# Patient Record
Sex: Male | Born: 1945 | Race: White | Hispanic: No | Smoking: Former smoker
Health system: Southern US, Community
[De-identification: ages and names within clinical notes are randomized; demographics above are authoritative.]

## PROBLEM LIST (undated history)

## (undated) DIAGNOSIS — I2510 Atherosclerotic heart disease of native coronary artery without angina pectoris: Secondary | ICD-10-CM

## (undated) DIAGNOSIS — Z9861 Coronary angioplasty status: Secondary | ICD-10-CM

## (undated) DIAGNOSIS — M4630 Infection of intervertebral disc (pyogenic), site unspecified: Secondary | ICD-10-CM

## (undated) DIAGNOSIS — I10 Essential (primary) hypertension: Secondary | ICD-10-CM

## (undated) DIAGNOSIS — K922 Gastrointestinal hemorrhage, unspecified: Secondary | ICD-10-CM

## (undated) DIAGNOSIS — I38 Endocarditis, valve unspecified: Secondary | ICD-10-CM

## (undated) DIAGNOSIS — B955 Unspecified streptococcus as the cause of diseases classified elsewhere: Secondary | ICD-10-CM

## (undated) DIAGNOSIS — I219 Acute myocardial infarction, unspecified: Secondary | ICD-10-CM

## (undated) DIAGNOSIS — G4700 Insomnia, unspecified: Secondary | ICD-10-CM

## (undated) DIAGNOSIS — E785 Hyperlipidemia, unspecified: Secondary | ICD-10-CM

## (undated) DIAGNOSIS — Z87891 Personal history of nicotine dependence: Secondary | ICD-10-CM

## (undated) DIAGNOSIS — C4490 Unspecified malignant neoplasm of skin, unspecified: Secondary | ICD-10-CM

## (undated) DIAGNOSIS — I499 Cardiac arrhythmia, unspecified: Secondary | ICD-10-CM

## (undated) DIAGNOSIS — K5790 Diverticulosis of intestine, part unspecified, without perforation or abscess without bleeding: Secondary | ICD-10-CM

## (undated) DIAGNOSIS — M1990 Unspecified osteoarthritis, unspecified site: Secondary | ICD-10-CM

## (undated) DIAGNOSIS — Z8719 Personal history of other diseases of the digestive system: Secondary | ICD-10-CM

## (undated) DIAGNOSIS — I252 Old myocardial infarction: Secondary | ICD-10-CM

## (undated) DIAGNOSIS — M24011 Loose body in right shoulder: Secondary | ICD-10-CM

## (undated) DIAGNOSIS — Z8711 Personal history of peptic ulcer disease: Secondary | ICD-10-CM

## (undated) DIAGNOSIS — Z961 Presence of intraocular lens: Secondary | ICD-10-CM

## (undated) DIAGNOSIS — H3322 Serous retinal detachment, left eye: Secondary | ICD-10-CM

## (undated) DIAGNOSIS — R7881 Bacteremia: Secondary | ICD-10-CM

## (undated) DIAGNOSIS — H3522 Other non-diabetic proliferative retinopathy, left eye: Secondary | ICD-10-CM

## (undated) DIAGNOSIS — D62 Acute posthemorrhagic anemia: Secondary | ICD-10-CM

## (undated) DIAGNOSIS — I4891 Unspecified atrial fibrillation: Secondary | ICD-10-CM

## (undated) HISTORY — DX: Unspecified atrial fibrillation: I48.91

## (undated) HISTORY — DX: Unspecified streptococcus as the cause of diseases classified elsewhere: R78.81

## (undated) HISTORY — DX: Acute posthemorrhagic anemia: D62

## (undated) HISTORY — PX: OTHER SURGICAL HISTORY: SHX169

## (undated) HISTORY — DX: Gastrointestinal hemorrhage, unspecified: K92.2

## (undated) HISTORY — PX: LUMBAR LAMINECTOMY: SHX95

## (undated) HISTORY — PX: CATARACT EXTRACTION W/ INTRAOCULAR LENS IMPLANT: SHX1309

## (undated) HISTORY — DX: Insomnia, unspecified: G47.00

## (undated) HISTORY — DX: Unspecified streptococcus as the cause of diseases classified elsewhere: B95.5

## (undated) HISTORY — PX: SKIN CANCER EXCISION: SHX779

## (undated) HISTORY — DX: Diverticulosis of intestine, part unspecified, without perforation or abscess without bleeding: K57.90

## (undated) HISTORY — DX: Other non-diabetic proliferative retinopathy, left eye: H35.22

## (undated) HISTORY — DX: Presence of intraocular lens: Z96.1

## (undated) HISTORY — DX: Serous retinal detachment, left eye: H33.22

## (undated) HISTORY — DX: Infection of intervertebral disc (pyogenic), site unspecified: M46.30

## (undated) HISTORY — DX: Personal history of nicotine dependence: Z87.891

## (undated) HISTORY — DX: Endocarditis, valve unspecified: I38

---

## 1990-01-19 DIAGNOSIS — I252 Old myocardial infarction: Secondary | ICD-10-CM

## 1990-01-19 HISTORY — PX: CORONARY ANGIOPLASTY: SHX604

## 1990-01-19 HISTORY — DX: Old myocardial infarction: I25.2

## 2000-12-21 ENCOUNTER — Ambulatory Visit (HOSPITAL_COMMUNITY): Admission: RE | Admit: 2000-12-21 | Discharge: 2000-12-21 | Payer: Self-pay | Admitting: Gastroenterology

## 2000-12-21 ENCOUNTER — Encounter (INDEPENDENT_AMBULATORY_CARE_PROVIDER_SITE_OTHER): Payer: Self-pay | Admitting: *Deleted

## 2002-12-13 ENCOUNTER — Encounter: Admission: RE | Admit: 2002-12-13 | Discharge: 2002-12-13 | Payer: Self-pay | Admitting: Internal Medicine

## 2002-12-13 ENCOUNTER — Inpatient Hospital Stay (HOSPITAL_COMMUNITY): Admission: EM | Admit: 2002-12-13 | Discharge: 2002-12-17 | Payer: Self-pay | Admitting: Emergency Medicine

## 2002-12-15 HISTORY — PX: ERCP W/ SPHICTEROTOMY: SHX1523

## 2002-12-16 ENCOUNTER — Encounter (INDEPENDENT_AMBULATORY_CARE_PROVIDER_SITE_OTHER): Payer: Self-pay | Admitting: *Deleted

## 2002-12-16 HISTORY — PX: LAPAROSCOPIC CHOLECYSTECTOMY: SUR755

## 2008-08-22 HISTORY — PX: CARDIOVASCULAR STRESS TEST: SHX262

## 2008-09-15 ENCOUNTER — Emergency Department (HOSPITAL_COMMUNITY): Admission: EM | Admit: 2008-09-15 | Discharge: 2008-09-15 | Payer: Self-pay | Admitting: Emergency Medicine

## 2010-04-26 LAB — TYPE AND SCREEN
ABO/RH(D): B POS
Antibody Screen: NEGATIVE

## 2010-04-26 LAB — CBC
HCT: 40.9 % (ref 39.0–52.0)
Hemoglobin: 13.8 g/dL (ref 13.0–17.0)
MCHC: 33.7 g/dL (ref 30.0–36.0)
MCV: 97.6 fL (ref 78.0–100.0)
Platelets: 156 10*3/uL (ref 150–400)
RBC: 4.19 MIL/uL — ABNORMAL LOW (ref 4.22–5.81)
RDW: 13.4 % (ref 11.5–15.5)
WBC: 5.9 10*3/uL (ref 4.0–10.5)

## 2010-04-26 LAB — COMPREHENSIVE METABOLIC PANEL
ALT: 22 U/L (ref 0–53)
AST: 27 U/L (ref 0–37)
Albumin: 4.2 g/dL (ref 3.5–5.2)
Alkaline Phosphatase: 44 U/L (ref 39–117)
BUN: 15 mg/dL (ref 6–23)
CO2: 25 mEq/L (ref 19–32)
Calcium: 9.4 mg/dL (ref 8.4–10.5)
Chloride: 104 mEq/L (ref 96–112)
Creatinine, Ser: 0.88 mg/dL (ref 0.4–1.5)
GFR calc Af Amer: >60 mL/min (ref >60)
GFR calc non Af Amer: >60 mL/min (ref >60)
Glucose, Bld: 92 mg/dL (ref 70–99)
Potassium: 4 mEq/L (ref 3.5–5.1)
Sodium: 138 mEq/L (ref 135–145)
Total Bilirubin: 1.5 mg/dL — ABNORMAL HIGH (ref 0.3–1.2)
Total Protein: 6.8 g/dL (ref 6.0–8.3)

## 2010-04-26 LAB — DIFFERENTIAL
Basophils Absolute: 0 10*3/uL (ref 0.0–0.1)
Basophils Relative: 1 % (ref 0–1)
Eosinophils Absolute: 0.2 10*3/uL (ref 0.0–0.7)
Eosinophils Relative: 4 % (ref 0–5)
Lymphocytes Relative: 24 % (ref 12–46)
Lymphs Abs: 1.4 10*3/uL (ref 0.7–4.0)
Monocytes Absolute: 0.5 10*3/uL (ref 0.1–1.0)
Monocytes Relative: 9 % (ref 3–12)
Neutro Abs: 3.7 10*3/uL (ref 1.7–7.7)
Neutrophils Relative %: 62 % (ref 43–77)

## 2010-04-26 LAB — ABO/RH: ABO/RH(D): B POS

## 2010-04-26 LAB — HEMOCCULT GUIAC POC 1CARD (OFFICE): Fecal Occult Bld: POSITIVE

## 2010-04-26 LAB — PROTIME-INR
INR: 1.1 (ref 0.00–1.49)
Prothrombin Time: 14.5 seconds (ref 11.6–15.2)

## 2010-06-06 NOTE — Op Note (Signed)
NAME:  Jonathan Holden, Jonathan Holden                        ACCOUNT NO.:  [id number]   MEDICAL RECORD NO.:  [id number]                   PATIENT TYPE:  INP   LOCATION:  5704                                 FACILITY:  [facility]   PHYSICIAN:  Thornton Park. Daphine Deutscher, M.D.             DATE OF BIRTH:  02/29/1945   DATE OF PROCEDURE:  12/16/2002  DATE OF DISCHARGE:                                 OPERATIVE REPORT   PREOPERATIVE DIAGNOSIS:  Gallstones with choledocholithiasis.   PROCEDURES:  Laparoscopic cholecystectomy with intraoperative cholangiogram.   SURGEON:  Thornton Park. Daphine Deutscher, M.D.   ASSISTANT:  Gabrielle Dare. Janee Morn, M.D.   ANESTHESIA:  General endotracheal.   FINDINGS:  Consistent with acute and chronic cholecystitis with a dilated  common duct that flowed easily into the duodenum.  No retained stones noted.   DESCRIPTION OF PROCEDURE:  Sherrod Toothman was taken to the OR on Saturday,  December 16, 2002, for a laparoscopic cholecystectomy.  Cholecystectomy with  potential complications, limited bile duct injury, and bleeding were  discussed with him preoperatively.  He was given general anesthesia.  A  Foley was inserted.  The potential sites were injected with 0.5% Marcaine.  A longitudinal incision was made in the umbilicus in the inferior aspect.  I  encountered a small umbilical hernia which I then opened and inserted the  Hasson cannula through a pursestring suture without difficulty.  The abdomen  was insufflated and the three trocar sites were placed in the upper abdomen,  again using Marcaine.  The gallbladder was hidden behind the liver adhesions  from the omentum, stuck up on the top of the liver where it had been pretty  well walled off.  I used scissors to take these adhesions down.  We then  grasped the gallbladder and elevated this and found it to have a lot of  adhesions along it, but two were taken away with sharp dissection.  The  infundibulum was somewhat edematous and I carefully used  hydrodissection to  identify the planes and identify formation of the cystic duct.  I put a clip  upon the gallbladder site, incised the beginning of the cystic duct, and  then inserted a Reddick catheter and took it down and the cholangiogram.   The cholangiogram showed some dilatation of the cystic duct inflowing into  the a slightly dilated common bile duct, but with prompt flow into the  duodenum and no obvious retained stones were noted.  The cystic duct was  then triple clipped and divided.  The cystic artery was triple clipped and  divided.  Then the gallbladder was removed from the gallbladder bed, staying  up on the gallbladder without entering it.  He did have a lot of vessel  coming into it, however, we required cautery.  In the end, it was detached,  put in a bag, and brought out through the umbilicus.  I inspected the  gallbladder bed and  used the electrocautery to control bleeding of the bed.  No bleeding or bowel leaks were then noted after it was irrigated.   Once the gallbladder was removed, we used the laparoscope to visualize the  umbilical region where I took down a single adhesion which had been stuck up  into the little hernia.  I then repaired this under laparoscopic vision  first by tying down the pursestring and then putting a single transverse  incision across closing the defect in a longitudinal fashion.  The abdomen  was inspected.  No bleeding or bowel leaks were noted.  The irrigant was  withdrawn with a  suction device and then port sites were removed.  The wounds were closed  with 4-0 Vicryl with Benzoin and Steri-Strips.  The patient seemed to  tolerate the procedure well and was taken to the recovery room in  satisfactory condition.                                               Thornton Park Daphine Deutscher, M.D.    MBM/MEDQ  D:  12/16/2002  T:  12/16/2002  Job:  [id number]   cc:   Bernette Redbird, M.D.  [address] [place]  [place], [place] [address]  Fax:  [phone]   [facility]

## 2010-06-06 NOTE — H&P (Signed)
NAME:  Jonathan Holden, Jonathan Holden                        ACCOUNT NO.:  [id number]   MEDICAL RECORD NO.:  [id number]                   PATIENT TYPE:  EMS   LOCATION:  MINO                                 FACILITY:  [facility]   PHYSICIAN:  Georgann Housekeeper, M.D.                 DATE OF BIRTH:  Sep 01, 1945   DATE OF ADMISSION:  12/13/2002  DATE OF DISCHARGE:                                HISTORY & PHYSICAL   CHIEF COMPLAINT:  Abdominal pain and jaundice.   HISTORY OF PRESENT ILLNESS:  This is a 65 year old male with history of  coronary artery disease, hypertension, hyperlipidemia.  Reportedly about one  month ago or so, he started having some abdominal pain, nonspecific and  described as spasms lasting about two hours and then resolved.  He would get  a little nauseous, sweating at times at night mostly.  Over the last week or  so, this has been getting more frequent, and in the last two days, he had a  lot of abdominal pain, nausea at one time and vomited.  His pain continued.  His nausea resolved after vomiting.  He says the pain usually happens after  he eats, and he gets uncomfortable.  So far he has had no fevers but has  felt hot and cold and sweaty at night at times.  He was yellowish and  jaundiced yesterday.  He came to the clinic today with the jaundice.  On  examination, LFTs were elevated with alkaline phosphatase and total  bilirubin elevated consistent with an obstructive picture.  He had an  ultrasound done at radiology which showed severely dilated common bile duct  with gallstones in the neck of the gallbladder as well as in the gallbladder  and mildly dilated pancreatic duct.  Admitted for further workup.   PAST MEDICAL HISTORY:  1. Coronary artery disease status post angioplasty.  2. MI in 1992.  3. Hyperlipidemia.  4. Gastric ulcer disease.  5. Mild rhinitis.  6. Occasional hypertension.   ALLERGIES:  None.   MEDICATIONS:  1. Lipitor 20 mg a day.  2. Toprol 50 mg daily.  3. Zetia 10 mg daily.  4. Aspirin 1 a day.  5. Multivitamins.  6. Valtrex 500 mg  daily.   PAST SURGICAL HISTORY:  1. Laminectomy L4-L5.  2. Vasectomy.  3. Angioplasty.   SOCIAL HISTORY:  Control and instrumentation engineer, independent, divorced, no children, no  tobacco, occasional wine, occasional caffeine.  Fairly active.   PHYSICAL EXAMINATION:  VITAL SIGNS:  Temperature 98, blood pressure 120/74,  pulse 58, respirations 20.  GENERAL:  Awake and alert, in no acute distress, and mildly jaundiced.  Denies any abdominal pain.  HEENT:  Pupils reactive briskly.  Anicteric.  Oropharynx is clear.  NECK:  Supple, no JVD.  LUNGS:  Clear.  CARDIOVASCULAR:  S1 and S2 without murmur.  ABDOMEN:  Soft without any tenderness.  EXTREMITIES:  No edema.  NEUROLOGIC:  Nonfocal.   LABORATORY DATA:  Lab data from the clinic today showed LFTs with total  bilirubin 6.7, direct bilirubin 4.4, alkaline phosphatase 567, AST 183, ALT  322, total protein 6.3.  White count 13.4, hemoglobin 14.   Ultrasound report showed common bile duct dilated 2 cm, pancreatic duct  mildly dilated.  Stones in the gallbladder neck as well as gallbladder.   ASSESSMENT:  A 65 year old male with obstructive jaundice, elevated liver  functions and elevated white count, gallstones in the gallbladder.   IMPRESSION:  1. Obstructive jaundice.  Differential includes gallstones, acute versus     rule out mass, acute cholecystitis with elevated white count, no fever.     Plan:  I will put him in hospital, clear liquids, Unasyn antibiotics.  GI     for ERCP.  Will get abdominal and pelvic CT to rule out any pancreatic     lesion or ampullary tumor.  2. Coronary artery disease.  Continue on current medications.  3. Hypertension.  Continue on current medications.  4. Hyperlipidemia.  Hold off on Lipitor.   I discussed with Dr. Matthias Hughs who will see the patient  tomorrow.                                                Georgann Housekeeper,  M.D.    KH/MEDQ  D:  12/13/2002  T:  12/13/2002  Job:  [id number]

## 2010-06-06 NOTE — Op Note (Signed)
NAME:  Jonathan Holden, Jonathan Holden                        ACCOUNT NO.:  [id number]   MEDICAL RECORD NO.:  [id number]                   PATIENT TYPE:  INP   LOCATION:  5704                                 FACILITY:  [facility]   PHYSICIAN:  Bernette Redbird, M.D.                DATE OF BIRTH:  1945-09-19   DATE OF PROCEDURE:  12/15/2002  DATE OF DISCHARGE:                                 OPERATIVE REPORT   PROCEDURE:  Endoscopic retrograde cholangiopancreatography with  sphincterotomy and stone extraction.   INDICATIONS:  This is a 65 year old gentleman with a one-month history of  smoldering abdominal pain who presented with jaundice to his primary  physician and had significantly elevated liver chemistries.  Ultrasound  showed gallstones and a markedly dilated common bile duct measuring about 2  cm.  Abdominal CT showed a calcified stone in the distal duct without any  evidence of mass or malignancy.   FINDINGS:  Large stone removed from the CBD via a large sphincterotomy.  No  biliary stricturing or ampullary tumor noted.   DESCRIPTION OF PROCEDURE:  The nature, purpose, and risks of the procedure  had been very carefully discussed with the patient, including risks of  pancreatitis, rare mortality, cardiopulmonary problems, bleeding, infection,  need for emergency surgery, and the possibility of an unsuccessful  procedure.  He provided written consent and was brought from his hospital  room.  He was covered with Unasyn already, so no additional antibiotics were  administered.  The procedure was done with the patient in radiology in the  prone position.  Sedation prior to and during the course of the procedure  was fentanyl 125 mcg and Versed 12.5 mg IV, plus he also received Glucagon 1  mg IV to reduce duodenal contractility in divided doses during the course of  the procedure.  There was no problem with arrhythmias, desaturation, or  clinical instability during the course of the procedure.   The Olympus video duodenoscope was easily passed blindly into the esophagus  and advanced into a normal-appearing stomach and from there into the  duodenum.  There were some patchy erosions in the duodenum, and initially I  thought one of these corresponded to the papilla and there even seemed to be  a small orifice present.  Probing with the sphincterotome, however, did not  really lead to any evidence of an opening and at the suggestion of the  technician, we advanced the scope further down the duodenum where, indeed,  the major papilla was rapidly identified and easily cannulated with the  triple-lumen sphincterotome.  The guidewire was easily advanced deeply into  the biliary system selectively.  Contrast was injected disclosing a large  (roughly 15 mm) stone.  I did not see any biliary strictures, and I should  add that the papilla itself was free of any obvious abnormality such as a  tumor.  The pancreatic duct was never entered on this  exam, nor did we  attempt to do so.   A very generous sphincterotomy was made, probably measuring about 20 mm or  so.  Toward the end of it, there was a little bit oozing at the apex of the  cut, probably a total blood loss of 1 mL or less.   The sphincterotome was exchanged for the 15 mm balloon, which was inflated  and occupied perhaps half the diameter of the duct.  The duct was swept, and  I was able to extract the stone easily on the first attempt.  We then did an  occlusion cholangiogram, which demonstrated no evidence of other filling  defects, and after removing the balloon-tip catheter, there was rapid  drainage of contrast from the bile duct.   The patient tolerated the procedure well, and there were no apparent  complications.   IMPRESSION:  1. Large solitary common bile duct stone removed through a large     sphincterotomy as described above.  2. Minimal oozing with the sphincterotomy.  3. Normal-appearing papilla and no biliary  ductal strictures, thus, no     source of biliary obstruction such as tumor evident, apart from the     presence of the stone, which accounts for the dilatation.   PLAN:  The patient will be observed for complications with laparoscopic  cholecystectomy anticipated in the near future, possibly on this admission.                                               Bernette Redbird, M.D.    RB/MEDQ  D:  12/15/2002  T:  12/15/2002  Job:  [id number]   cc:   Georgann Housekeeper, M.D.  [address]  [place]  [place] [address]  Fax: [phone]   Jimmye Norman III, M.D.  [phone] [address]  [place]  [place] [address]  Fax: [phone]

## 2010-06-06 NOTE — Consult Note (Signed)
NAME:  Jonathan Holden, Jonathan Holden                        ACCOUNT NO.:  [id number]   MEDICAL RECORD NO.:  [id number]                   PATIENT TYPE:  INP   LOCATION:  5705                                 FACILITY:  [facility]   PHYSICIAN:  Bernette Redbird, M.D.                DATE OF BIRTH:  28-Apr-1945   DATE OF CONSULTATION:  12/14/2002  DATE OF DISCHARGE:                                   CONSULTATION   Dr. 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   Jonathan Holden was admitted to the hospital yesterday following a one-month  prodrome of nonspecific, predominantly postprandial, abdominal pain with  nausea, occasional vomiting, but no fevers.  He was noted to be jaundiced in  the office, and an ultrasound was obtained which showed gallbladder stones  and a markedly dilated (2 cm) common bile duct, so he was admitted to the  hospital.  At this time, he is basically asymptomatic apart from some mild  postprandial abdominal tightness.  A CT was obtained today which shows a  calcified gallstone in the distal common bile duct on review with Dr. Charise Killian of radiology.  The ductal system was markedly dilated.  There is no  evident pancreatic mass.   PAST MEDICAL HISTORY:   ALLERGIES:  No known drug allergies.   OUTPATIENT MEDICATIONS:  Lipitor, Toprol, and daily aspirin.   OPERATION:  Remote laminectomy.   MEDICAL ILLNESSES:  1. Coronary disease status post MI approximately 10 years ago, status post     PTCA.  Note that he works out regularly without chest pain.  2. GERD.  3. Hyperlipidemia.  4. Rhinitis.   No known history of pulmonary disease or diabetes.   HABITS:  Occasional alcohol, nonsmoker.   FAMILY HISTORY:  Negative for colon cancer or biliary tract disease in the  immediate family.   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  He lives alone in [place].   REVIEW  OF SYSTEMS:  Apart from the symptoms over the past month presumably  associated with biliary tract disease, the patient is free of ongoing GI  tracts symptomatology such as anorexia, weight loss, dysphagia, reflux,  chronic abdominal pain, constipation, or diarrhea.  He has had cancer  screening with colonoscopy by Dr. Danise Edge two years ago showing an  adenomatous polyp.   PHYSICAL EXAMINATION:  GENERAL:  A lean, healthy-appearing gentleman in no  evident distress, neither anxious nor depressed.  There is no frank jaundice  at this time.  He has no overt pallor.  CHEST:  Clear.  HEART:  Normal.  ABDOMEN:  Without organomegaly, guarding, mass, or tenderness with  particular reference to the right upper quadrant.   LABORATORY DATA:  White count 7300, hemoglobin 13.0 with normal MCV,  platelets 320,000.  INR 1.0.  Chemistry panel pertinent only  for elevated  liver chemistries with a bilirubin of 3.2, alkaline phosphatase 504, SGOT  111, SGPT 206, and albumin 2.6.  These numbers are not markedly changed from  yesterday.  Amylase and lipase are normal.   IMPRESSION:  1. Symptomatic choledocholithiasis with associated cholelithiasis.     Manifestations include elevated liver chemistries and marked biliary     ductal dilatation.  Thus, at least the possibility of an ampullary tumor     needs to be kept in mind.  2. History of coronary disease status post myocardial infarction without     current anginal symptoms.   PLAN:  ERCP with attempted sphincterotomy and stone extraction tomorrow.  The nature, purpose, risks, and alternatives of the procedure were carefully  and thoroughly discussed with the patient with the assistance of a diagram.  His questions were answered.  He is aware that there is perhaps 3 to 5% risk  of postprocedural pancreatitis which can occasionally be fatal in severity,  as well as other complications which occur less commonly such as  cardiopulmonary  reactions, infection, perforation, or bleeding, with the  possibility of need for emergency surgery to correct the problems and with a  moderate possibility of an unsuccessful procedure.  He is agreeable to  proceed.                                               Bernette Redbird, M.D.    RB/MEDQ  D:  12/14/2002  T:  12/14/2002  Job:  [id number]   cc:   Georgann Housekeeper, M.D.  [address]  [place]  [place] [address]  Fax: [phone]   Jimmye Norman III, M.D.  [phone] [address]  [place]  [place] [address]  Fax: [phone]   Danise Edge, M.D.  [address]  [place]  [place] [address]  Fax: [phone]

## 2010-06-06 NOTE — Procedures (Signed)
[place]  Patient:    Jonathan Holden, Jonathan Holden Visit Number: [id number] MRN: [id number]          Service Type: Attending:  Verlin Grills, M.D. Dictated by:   Verlin Grills, M.D. Proc. Date: 12/21/00   CC:         Wayne C. Dorna Bloom, M.D.                           Procedure Report  DATE OF BIRTH:  1945/08/26  REFERRING PHYSICIAN:  [place] C. Dorna Bloom, M.D.  PROCEDURE PERFORMED:  Colonoscopy.  ENDOSCOPIST:  Verlin Grills, M.D.  INDICATIONS FOR PROCEDURE:  The patient is a 65 year old male.  Mr. Grunewald underwent his health maintenance flexible proctosigmoidoscopy.  A small polyp behind a mucosal fold was identified with insertion of the endoscope but not on withdrawal of the endoscope.  I discussed with the patient the complications associated with colonoscopy and polypectomy including a 15 per 1000 risk of bleeding and 4 per 1000 risk of colonic perforation requiring surgical repair.  The patient has signed the operative permit.  PREMEDICATION:  Demerol 50 mg, Versed 5 mg.  ENDOSCOPE:  Olympus pediatric video colonoscope.  DESCRIPTION OF PROCEDURE:  After obtaining informed consent, the patient was placed in the left lateral decubitus position.  I administered intravenous Demerol and intravenous Versed to achieve conscious sedation for the procedure.  The patients blood pressure, oxygen saturation and cardiac rhythm were monitored throughout the procedure and documented in the medical record.  Anal inspection was normal.  Digital rectal exam revealed a nonnodular prostate.  The Olympus pediatric video colonoscope was introduced into the rectum and advanced to the cecum.  Colonic preparation for the exam today was satisfactory.  The patient has extensive universal colonic diverticulosis without signs of diverticulitis.  Rectum:  Normal.  Sigmoid colon and descending colon:  Normal.  Splenic flexure:  Normal.  Transverse  colon:   From the distal transverse colon a 0.5 mm sessile polyp was removed with a cold biopsy forceps.  Hepatic flexure:  Normal.  Ascending colon:  Normal.  Cecum and ileocecal valve:  Normal.  ASSESSMENT: 1. Extensive universal colonic diverticulosis. 2. From the transverse colon a 0.5 mm sessile polyp was removed with a cold    biopsy forceps. RECOMMENDATIONS:  If the colonic polyp returns neoplastic, the patient should undergo a repeat colonoscopy in five years.  If the polyp is nonneoplastic, the patient should undergo a repeat colonoscopy in approximately 10 years. Dictated by:   Verlin Grills, M.D. Attending:  Verlin Grills, M.D. DD:  12/21/00 TD:  12/21/00 Job: [id number]

## 2010-06-06 NOTE — Discharge Summary (Signed)
NAME:  MARIAN, GRANDT                        ACCOUNT NO.:  [id number]   MEDICAL RECORD NO.:  [id number]                   PATIENT TYPE:  INP   LOCATION:  5704                                 FACILITY:  [facility]   PHYSICIAN:  Georgann Housekeeper, M.D.                 DATE OF BIRTH:  Feb 26, 1945   DATE OF ADMISSION:  12/13/2002  DATE OF DISCHARGE:  12/17/2002                                 DISCHARGE SUMMARY   DISCHARGE DIAGNOSES:  1. Choledocholithiasis, abdominal pain, jaundice.  2. Status post laparoscopic cholecystectomy.  3. As far as coronary artery disease, hypertension.   MEDICATIONS AT DISCHARGE:  1. Lipitor 20 mg q.d.  2. Toprol 50 mg q.d.  3. Zetia 10 mg q.d.  4. Aspirin.  5. Multivitamin.   CONDITION:  Stable.   CONSULTATIONS:  1. General surgery with Dr. Daphine Deutscher.  2. GI with Dr. Matthias Hughs.   PROCEDURE:  1. Laparoscopic cholecystectomy.  2. Endoscopic retrograde cholangiopancreatography.   HOSPITAL COURSE:  Please see H&P for details. The patient is 65 years old  with history of coronary artery disease admitted with abdominal pain,  elevated liver enzymes with obstructive picture, and white count of 13,000.  Ultrasound showed gallstone in the gallbladder neck with the mildly dilated  pancreatitis duct and dilated common bile duct.   1. Gastrointestinal. The patient was admitted for obstructive     choledocholithiasis with jaundice and transaminitis. Started on     antibiotics, Unasyn, along with fluids. CT of the abdomen and pelvis was     obtained which did not show any mass. The patient had stone. Underwent a     GI consult with ERCP which successfully extracted the stone by Dr.     Matthias Hughs. Subsequently, his liver enzymes improved considerably. The     patient had a surgical consult and underwent a laparoscopic     cholecystectomy on November 27. The patient tolerated the procedure well.     Please see surgical note for that. His white count improved to completely  normal. Chemistries improved. Hemodynamically remained stable.  2. As far as hypertension and coronary artery disease. Stable.   DISCHARGE INSTRUCTIONS:  The patient was discharged to follow up with Dr.  Daphine Deutscher of surgery in three weeks. Resume his other medications at home.                                                Georgann Housekeeper, M.D.    KH/MEDQ  D:  01/31/2003  T:  01/31/2003  Job:  [id number]

## 2011-02-09 ENCOUNTER — Other Ambulatory Visit: Payer: Self-pay | Admitting: Orthopedic Surgery

## 2011-02-09 DIAGNOSIS — R609 Edema, unspecified: Secondary | ICD-10-CM

## 2011-02-09 DIAGNOSIS — M25511 Pain in right shoulder: Secondary | ICD-10-CM

## 2011-02-13 ENCOUNTER — Ambulatory Visit
Admission: RE | Admit: 2011-02-13 | Discharge: 2011-02-13 | Disposition: A | Discharge status: Home / Self Care | Payer: Medicare Other | Source: Ambulatory Visit | Attending: Orthopedic Surgery | Admitting: Orthopedic Surgery

## 2011-02-13 DIAGNOSIS — R609 Edema, unspecified: Secondary | ICD-10-CM

## 2011-02-13 DIAGNOSIS — M25511 Pain in right shoulder: Secondary | ICD-10-CM

## 2011-03-09 ENCOUNTER — Other Ambulatory Visit: Payer: Self-pay | Admitting: Orthopedic Surgery

## 2011-03-26 ENCOUNTER — Encounter (HOSPITAL_BASED_OUTPATIENT_CLINIC_OR_DEPARTMENT_OTHER): Payer: Self-pay | Admitting: *Deleted

## 2011-03-27 ENCOUNTER — Encounter (HOSPITAL_BASED_OUTPATIENT_CLINIC_OR_DEPARTMENT_OTHER): Payer: Self-pay | Admitting: *Deleted

## 2011-03-27 NOTE — Progress Notes (Signed)
NPO AFTER MN. ARRIVES AT 0700. NEEDS ISTAT. CURRENT EKG, NOTE, STRESS TEST TO BE FAXED FROM DR Verdis Prime.  WILL TAKE METOPROLOL AM OF SURG. W/ SIP OF WATER.

## 2011-03-30 ENCOUNTER — Encounter (HOSPITAL_BASED_OUTPATIENT_CLINIC_OR_DEPARTMENT_OTHER): Payer: Self-pay | Admitting: *Deleted

## 2011-03-30 NOTE — Progress Notes (Signed)
RECEIVED EKG, LAST OFFICE NOTE, AND STRESS TEST , NO ECHO,  FROM DR [facility] OFFICE.  PUT IN CHART.

## 2011-04-01 ENCOUNTER — Ambulatory Visit (HOSPITAL_BASED_OUTPATIENT_CLINIC_OR_DEPARTMENT_OTHER)
Admission: RE | Admit: 2011-04-01 | Discharge: 2011-04-01 | Disposition: A | Discharge status: Home / Self Care | Payer: Medicare Other | Source: Ambulatory Visit | Attending: Orthopedic Surgery | Admitting: Orthopedic Surgery

## 2011-04-01 ENCOUNTER — Ambulatory Visit (HOSPITAL_BASED_OUTPATIENT_CLINIC_OR_DEPARTMENT_OTHER): Payer: Medicare Other | Admitting: Anesthesiology

## 2011-04-01 ENCOUNTER — Encounter (HOSPITAL_BASED_OUTPATIENT_CLINIC_OR_DEPARTMENT_OTHER): Payer: Self-pay | Admitting: *Deleted

## 2011-04-01 ENCOUNTER — Encounter (HOSPITAL_BASED_OUTPATIENT_CLINIC_OR_DEPARTMENT_OTHER)
Admission: RE | Disposition: A | Discharge status: Home / Self Care | Payer: Self-pay | Source: Ambulatory Visit | Attending: Orthopedic Surgery

## 2011-04-01 ENCOUNTER — Encounter (HOSPITAL_BASED_OUTPATIENT_CLINIC_OR_DEPARTMENT_OTHER): Payer: Self-pay | Admitting: Anesthesiology

## 2011-04-01 DIAGNOSIS — I10 Essential (primary) hypertension: Secondary | ICD-10-CM | POA: Insufficient documentation

## 2011-04-01 DIAGNOSIS — E785 Hyperlipidemia, unspecified: Secondary | ICD-10-CM | POA: Insufficient documentation

## 2011-04-01 DIAGNOSIS — I252 Old myocardial infarction: Secondary | ICD-10-CM | POA: Insufficient documentation

## 2011-04-01 DIAGNOSIS — Z9861 Coronary angioplasty status: Secondary | ICD-10-CM | POA: Insufficient documentation

## 2011-04-01 DIAGNOSIS — M19019 Primary osteoarthritis, unspecified shoulder: Secondary | ICD-10-CM | POA: Insufficient documentation

## 2011-04-01 DIAGNOSIS — Z79899 Other long term (current) drug therapy: Secondary | ICD-10-CM | POA: Insufficient documentation

## 2011-04-01 DIAGNOSIS — M67919 Unspecified disorder of synovium and tendon, unspecified shoulder: Secondary | ICD-10-CM | POA: Insufficient documentation

## 2011-04-01 DIAGNOSIS — M24019 Loose body in unspecified shoulder: Secondary | ICD-10-CM | POA: Insufficient documentation

## 2011-04-01 DIAGNOSIS — I2510 Atherosclerotic heart disease of native coronary artery without angina pectoris: Secondary | ICD-10-CM | POA: Insufficient documentation

## 2011-04-01 DIAGNOSIS — M719 Bursopathy, unspecified: Secondary | ICD-10-CM | POA: Insufficient documentation

## 2011-04-01 HISTORY — DX: Atherosclerotic heart disease of native coronary artery without angina pectoris: I25.10

## 2011-04-01 HISTORY — DX: Coronary angioplasty status: Z98.61

## 2011-04-01 HISTORY — DX: Essential (primary) hypertension: I10

## 2011-04-01 HISTORY — DX: Unspecified osteoarthritis, unspecified site: M19.90

## 2011-04-01 HISTORY — DX: Personal history of other diseases of the digestive system: Z87.19

## 2011-04-01 HISTORY — DX: Hyperlipidemia, unspecified: E78.5

## 2011-04-01 HISTORY — DX: Old myocardial infarction: I25.2

## 2011-04-01 HISTORY — DX: Loose body in right shoulder: M24.011

## 2011-04-01 HISTORY — DX: Personal history of peptic ulcer disease: Z87.11

## 2011-04-01 LAB — POCT I-STAT, CHEM 8
BUN: 12 mg/dL (ref 6–23)
Calcium, Ion: 1.28 mmol/L (ref 1.12–1.32)
Chloride: 106 mEq/L (ref 96–112)
Glucose, Bld: 106 mg/dL — ABNORMAL HIGH (ref 70–99)
Potassium: 4.4 mEq/L (ref 3.5–5.1)

## 2011-04-01 SURGERY — SHOULDER ARTHROSCOPY WITH SUBACROMIAL DECOMPRESSION
Anesthesia: General | Site: Shoulder | Laterality: Right | Wound class: Clean

## 2011-04-01 MED ORDER — BUPIVACAINE-EPINEPHRINE 0.5% -1:200000 IJ SOLN
INTRAMUSCULAR | Status: DC | PRN
  Administered 2011-04-01: 12 mL

## 2011-04-01 MED ORDER — SODIUM CHLORIDE 0.9 % IR SOLN
Status: DC | PRN
  Administered 2011-04-01: 09:00:00

## 2011-04-01 MED ORDER — LACTATED RINGERS IV SOLN
INTRAVENOUS | Status: DC
  Administered 2011-04-01 (×3): via INTRAVENOUS

## 2011-04-01 MED ORDER — FENTANYL CITRATE 0.05 MG/ML IJ SOLN
INTRAMUSCULAR | Status: DC | PRN
  Administered 2011-04-01: 50 ug via INTRAVENOUS
  Administered 2011-04-01 (×2): 25 ug via INTRAVENOUS

## 2011-04-01 MED ORDER — METHOCARBAMOL 500 MG PO TABS: 500.0000 mg | ORAL_TABLET | Freq: Four times a day (QID) | ORAL | Status: AC | PRN

## 2011-04-01 MED ORDER — FENTANYL CITRATE 0.05 MG/ML IJ SOLN
100.0000 ug | Freq: Once | INTRAMUSCULAR | Status: AC
  Administered 2011-04-01: 100 ug via INTRAVENOUS

## 2011-04-01 MED ORDER — HYDROMORPHONE HCL PF 1 MG/ML IJ SOLN: 0.2500 mg | INTRAMUSCULAR | Status: DC | PRN

## 2011-04-01 MED ORDER — OXYCODONE-ACETAMINOPHEN 5-325 MG PO TABS: 1.0000 | ORAL_TABLET | ORAL | Status: AC | PRN

## 2011-04-01 MED ORDER — SODIUM CHLORIDE 0.9 % IR SOLN
Status: DC | PRN
  Administered 2011-04-01: 15000 mL

## 2011-04-01 MED ORDER — MIDAZOLAM HCL 2 MG/2ML IJ SOLN
2.0000 mg | Freq: Once | INTRAMUSCULAR | Status: AC
  Administered 2011-04-01: 2 mg via INTRAVENOUS

## 2011-04-01 MED ORDER — DEXAMETHASONE SODIUM PHOSPHATE 4 MG/ML IJ SOLN
INTRAMUSCULAR | Status: DC | PRN
  Administered 2011-04-01: 10 mg via INTRAVENOUS

## 2011-04-01 MED ORDER — KETOROLAC TROMETHAMINE 30 MG/ML IJ SOLN
15.0000 mg | Freq: Once | INTRAMUSCULAR | Status: AC | PRN
  Administered 2011-04-01: 30 mg via INTRAVENOUS

## 2011-04-01 MED ORDER — LIDOCAINE HCL (CARDIAC) 20 MG/ML IV SOLN
INTRAVENOUS | Status: DC | PRN
  Administered 2011-04-01: 80 mg via INTRAVENOUS

## 2011-04-01 MED ORDER — ONDANSETRON HCL 4 MG/2ML IJ SOLN
INTRAMUSCULAR | Status: DC | PRN
  Administered 2011-04-01: 4 mg via INTRAVENOUS

## 2011-04-01 MED ORDER — PROPOFOL 10 MG/ML IV EMUL
INTRAVENOUS | Status: DC | PRN
  Administered 2011-04-01: 220 mg via INTRAVENOUS

## 2011-04-01 MED ORDER — POVIDONE-IODINE 7.5 % EX SOLN: Freq: Once | CUTANEOUS | Status: DC

## 2011-04-01 MED ORDER — PROMETHAZINE HCL 25 MG/ML IJ SOLN: 6.2500 mg | INTRAMUSCULAR | Status: DC | PRN

## 2011-04-01 MED ORDER — ROPIVACAINE HCL 5 MG/ML IJ SOLN
INTRAMUSCULAR | Status: DC | PRN
  Administered 2011-04-01: 25 mL

## 2011-04-01 MED ORDER — EPHEDRINE SULFATE 50 MG/ML IJ SOLN
INTRAMUSCULAR | Status: DC | PRN
  Administered 2011-04-01 (×2): 10 mg via INTRAVENOUS
  Administered 2011-04-01: 15 mg via INTRAVENOUS
  Administered 2011-04-01: 10 mg via INTRAVENOUS

## 2011-04-01 MED ORDER — SUCCINYLCHOLINE CHLORIDE 20 MG/ML IJ SOLN
INTRAMUSCULAR | Status: DC | PRN
  Administered 2011-04-01: 120 mg via INTRAVENOUS

## 2011-04-01 SURGICAL SUPPLY — 75 items
BENZOIN TINCTURE PRP APPL 2/3 (GAUZE/BANDAGES/DRESSINGS) ×2 IMPLANT
BLADE 4.2CUDA (BLADE) ×2 IMPLANT
BLADE CUDA 4.2 (BLADE) ×2 IMPLANT
BLADE CUDA 5.5 (BLADE) ×2 IMPLANT
BLADE CUDA SHAVER 3.5 (BLADE) IMPLANT
BLADE CUTTER GATOR 3.5 (BLADE) IMPLANT
BLADE FLAT COURSE (BLADE) IMPLANT
BLADE GREAT WHITE 4.2 (BLADE) IMPLANT
BLADE SURG 10 STRL SS (BLADE) IMPLANT
BLADE SURG 15 STRL LF DISP TIS (BLADE) IMPLANT
BLADE SURG 15 STRL SS (BLADE)
BUR OVAL 4.0 (BURR) ×2 IMPLANT
CANISTER SUCT LVC 12 LTR MEDI- (MISCELLANEOUS) ×4 IMPLANT
CANISTER SUCTION 1200CC (MISCELLANEOUS) ×2 IMPLANT
CANISTER SUCTION 2500CC (MISCELLANEOUS) IMPLANT
CANNULA 5.75X7 CRYSTAL CLEAR (CANNULA) ×2 IMPLANT
CANNULA TWIST IN 8.25X7CM (CANNULA) ×2 IMPLANT
CLOTH BEACON ORANGE TIMEOUT ST (SAFETY) ×2 IMPLANT
CONMED GREAT WHITE 5.5MM ×2 IMPLANT
DRAPE LG THREE QUARTER DISP (DRAPES) ×4 IMPLANT
DRAPE SHOULDER BEACH CHAIR (DRAPES) ×2 IMPLANT
DRAPE U-SHAPE 47X51 STRL (DRAPES) ×2 IMPLANT
DRSG ADAPTIC 3X8 NADH LF (GAUZE/BANDAGES/DRESSINGS) ×2 IMPLANT
DRSG EMULSION OIL 3X3 NADH (GAUZE/BANDAGES/DRESSINGS) ×2 IMPLANT
DRSG PAD ABDOMINAL 8X10 ST (GAUZE/BANDAGES/DRESSINGS) ×2 IMPLANT
DURAPREP 26ML APPLICATOR (WOUND CARE) ×2 IMPLANT
ELECT MENISCUS 165MM 90D (ELECTRODE) IMPLANT
ELECT REM PT RETURN 9FT ADLT (ELECTROSURGICAL) ×2
ELECTRODE REM PT RTRN 9FT ADLT (ELECTROSURGICAL) ×1 IMPLANT
GLOVE BIOGEL PI IND STRL 6.5 (GLOVE) ×2 IMPLANT
GLOVE BIOGEL PI IND STRL 8 (GLOVE) ×1 IMPLANT
GLOVE BIOGEL PI INDICATOR 6.5 (GLOVE) ×2
GLOVE BIOGEL PI INDICATOR 8 (GLOVE) ×1
GLOVE ECLIPSE 8.0 STRL XLNG CF (GLOVE) ×8 IMPLANT
GLOVE INDICATOR 8.0 STRL GRN (GLOVE) ×4 IMPLANT
GOWN PREVENTION PLUS LG XLONG (DISPOSABLE) ×2 IMPLANT
GOWN STRL REIN XL XLG (GOWN DISPOSABLE) ×4 IMPLANT
IV NS IRRIG 3000ML ARTHROMATIC (IV SOLUTION) ×14 IMPLANT
NDL SAFETY ECLIPSE 18X1.5 (NEEDLE) ×1 IMPLANT
NEEDLE 1/2 CIR CATGUT .05X1.09 (NEEDLE) IMPLANT
NEEDLE HYPO 18GX1.5 BLUNT FILL (NEEDLE) ×2 IMPLANT
NEEDLE HYPO 18GX1.5 SHARP (NEEDLE) ×1
NEEDLE HYPO 22GX1.5 SAFETY (NEEDLE) ×2 IMPLANT
NS IRRIG 500ML POUR BTL (IV SOLUTION) IMPLANT
PACK ARTHROSCOPY DSU (CUSTOM PROCEDURE TRAY) ×2 IMPLANT
PACK BASIN DAY SURGERY FS (CUSTOM PROCEDURE TRAY) ×2 IMPLANT
PENCIL BUTTON HOLSTER BLD 10FT (ELECTRODE) IMPLANT
SET ARTHROSCOPY TUBING (MISCELLANEOUS) ×1
SET ARTHROSCOPY TUBING LN (MISCELLANEOUS) ×1 IMPLANT
SLEEVE SURGEON STRL (DRAPES) ×6 IMPLANT
SLING ARM IMMOBILIZER LRG (SOFTGOODS) ×2 IMPLANT
SPONGE GAUZE 4X4 12PLY (GAUZE/BANDAGES/DRESSINGS) ×2 IMPLANT
SPONGE SURGIFOAM ABS GEL 100 (HEMOSTASIS) IMPLANT
STAPLER VISISTAT 35W (STAPLE) IMPLANT
STRIP CLOSURE SKIN 1/2X4 (GAUZE/BANDAGES/DRESSINGS) ×2 IMPLANT
SUCTION FRAZIER TIP 10 FR DISP (SUCTIONS) ×2 IMPLANT
SUT BONE WAX W31G (SUTURE) IMPLANT
SUT ETHIBOND GREEN BRAID 0S 4 (SUTURE) IMPLANT
SUT ETHIBOND NAB CT1 #1 30IN (SUTURE) IMPLANT
SUT ETHILON 4 0 PS 2 18 (SUTURE) ×4 IMPLANT
SUT VIC AB 0 CT1 36 (SUTURE) IMPLANT
SUT VIC AB 1 CT1 36 (SUTURE) IMPLANT
SUT VIC AB 2-0 CT1 27 (SUTURE)
SUT VIC AB 2-0 CT1 TAPERPNT 27 (SUTURE) IMPLANT
SUT VIC AB 3-0 SH 27 (SUTURE)
SUT VIC AB 3-0 SH 27X BRD (SUTURE) IMPLANT
SUT VICRYL 4-0 PS2 18IN ABS (SUTURE) IMPLANT
SYR BULB IRRIGATION 50ML (SYRINGE) ×2 IMPLANT
SYRINGE 10CC LL (SYRINGE) ×2 IMPLANT
TAPE CLOTH SURG 6X10 WHT LF (GAUZE/BANDAGES/DRESSINGS) ×2 IMPLANT
TAPE HYPAFIX 6X30 (GAUZE/BANDAGES/DRESSINGS) IMPLANT
TOWEL OR 17X24 6PK STRL BLUE (TOWEL DISPOSABLE) ×4 IMPLANT
TUBE CONNECTING 12X1/4 (SUCTIONS) ×2 IMPLANT
WAND 90 DEG TURBOVAC W/CORD (SURGICAL WAND) ×2 IMPLANT
WATER STERILE IRR 500ML POUR (IV SOLUTION) ×2 IMPLANT

## 2011-04-01 NOTE — Anesthesia Postprocedure Evaluation (Signed)
  Anesthesia Post-op Note  Patient: Jonathan Holden  Procedure(s) Performed: Procedure(s) (LRB): SHOULDER ARTHROSCOPY WITH SUBACROMIAL DECOMPRESSION (Right)  Patient Location: PACU  Anesthesia Type: GA combined with regional for post-op pain  Level of Consciousness: awake and alert   Airway and Oxygen Therapy: Patient Spontanous Breathing  Post-op Pain: mild  Post-op Assessment: Post-op Vital signs reviewed, Patient's Cardiovascular Status Stable, Respiratory Function Stable, Patent Airway and No signs of Nausea or vomiting  Post-op Vital Signs: stable  Complications: No apparent anesthesia complications

## 2011-04-01 NOTE — Anesthesia Procedure Notes (Addendum)
Procedure Name: Intubation Date/Time: 04/01/2011 8:42 AM Performed by: Fran Lowes Pre-anesthesia Checklist: Patient identified, Emergency Drugs available, Suction available and Patient being monitored Patient Re-evaluated:Patient Re-evaluated prior to inductionOxygen Delivery Method: Circle System Utilized Preoxygenation: Pre-oxygenation with 100% oxygen Intubation Type: IV induction Ventilation: Mask ventilation without difficulty Laryngoscope Size: Mac and 4 Grade View: Grade I Tube type: Oral Number of attempts: 1 Airway Equipment and Method: stylet,  oral airway and LTA kit utilized Placement Confirmation: ETT inserted through vocal cords under direct vision,  positive ETCO2 and breath sounds checked- equal and bilateral Secured at: 23 cm Tube secured with: Tape Dental Injury: Teeth and Oropharynx as per pre-operative assessment    Anesthesia Regional Block:  Interscalene brachial plexus block  Pre-Anesthetic Checklist: ,, timeout performed, Correct Patient, Correct Site, Correct Laterality, Correct Procedure, Correct Position, site marked, Risks and benefits discussed,  Surgical consent,  Pre-op evaluation,  At surgeon's request and post-op pain management  Laterality: Right  Prep: chloraprep       Needles:  Injection technique: Single-shot  Needle Type: Echogenic Needle     Needle Length:cm 9 cm Needle Gauge: 21 G    Additional Needles:  Procedures: ultrasound guided Interscalene brachial plexus block Narrative:  Start time: 04/01/2011 8:14 AM End time: 04/01/2011 8:23 AM Injection made incrementally with aspirations every 5 mL.  Performed by: Personally  Anesthesiologist: Eilene Ghazi MD  Additional Notes: Patient tolerated the procedure well without complications  Interscalene brachial plexus block

## 2011-04-01 NOTE — Brief Op Note (Signed)
04/01/2011  11:04 AM  PATIENT:  Jonathan Holden  66 y.o. male  PRE-OPERATIVE DIAGNOSIS:  right shoulder multiple loose bodies  POST-OPERATIVE DIAGNOSIS:  right shoulder multiple loose bodies, labral tear,rotator cuff tendinopathy  PROCEDURE:  Procedure(s) (LRB): SHOULDER ARTHROSCOPY WITH SUBACROMIAL DECOMPRESSION (Right),labral debridement, and removal numerous loose osteochondral bodies  SURGEON:  Surgeon(s) and Role:    * Drucilla Schmidt, MD - Primary  PHYSICIAN ASSISTANT:   ASSISTANTS: Mr Idolina Primer [facility],   ANESTHESIA:   regional and general  EBL:  Total I/O In: 1400 [I.V.:1400] Out: -   BLOOD ADMINISTERED:none  DRAINS: none   LOCAL MEDICATIONS USED:  NONE  SPECIMEN:  Source of Specimen:  multiple osteochondral ragments  DISPOSITION OF SPECIMEN:  N/A  COUNTS:  YES  TOURNIQUET:  * No tourniquets in log *  DICTATION: .Other Dictation: Dictation Number [id number]  PLAN OF CARE: Discharge to home after PACU  PATIENT DISPOSITION:  PACU - hemodynamically stable.   Delay start of Pharmacological VTE agent (>24hrs) due to surgical blood loss or risk of bleeding: not applicable

## 2011-04-01 NOTE — Transfer of Care (Signed)
Immediate Anesthesia Transfer of Care Note  Patient: Jonathan Holden  Procedure(s) Performed: Procedure(s) (LRB): SHOULDER ARTHROSCOPY WITH SUBACROMIAL DECOMPRESSION (Right)  Patient Location: Patient transported to PACU with oxygen via face mask at 4 Liters / Min  Anesthesia Type: General  Level of Consciousness: awake and alert   Airway & Oxygen Therapy: Patient Spontanous Breathing and Patient connected to face mask oxygen  Post-op Assessment: Report given to PACU RN and Post -op Vital signs reviewed and stable  Post vital signs: Reviewed and stable  Dentition: Teeth and oropharynx remain in pre-op condition  Complications: No apparent anesthesia complications

## 2011-04-01 NOTE — Anesthesia Preprocedure Evaluation (Addendum)
Anesthesia Evaluation  Patient identified by MRN, date of birth, ID band Patient awake    Reviewed: Allergy & Precautions, H&P , NPO status , Patient's Chart, lab work & pertinent test results  Airway Mallampati: II TM Distance: <3 FB Neck ROM: Full    Dental No notable dental hx.    Pulmonary neg pulmonary ROS,  breath sounds clear to auscultation  Pulmonary exam normal       Cardiovascular Exercise Tolerance: Good hypertension, + CAD Rhythm:Regular Rate:Normal     Neuro/Psych negative neurological ROS  negative psych ROS   GI/Hepatic Neg liver ROS, GERD-  ,  Endo/Other  negative endocrine ROS  Renal/GU negative Renal ROS  negative genitourinary   Musculoskeletal negative musculoskeletal ROS (+)   Abdominal   Peds negative pediatric ROS (+)  Hematology negative hematology ROS (+)   Anesthesia Other Findings   Reproductive/Obstetrics negative OB ROS                          Anesthesia Physical Anesthesia Plan  ASA: III  Anesthesia Plan: General   Post-op Pain Management:    Induction: Intravenous  Airway Management Planned: Oral ETT  Additional Equipment:   Intra-op Plan:   Post-operative Plan: Extubation in OR  Informed Consent: I have reviewed the patients History and Physical, chart, labs and discussed the procedure including the risks, benefits and alternatives for the proposed anesthesia with the patient or authorized representative who has indicated his/her understanding and acceptance.   Dental advisory given  Plan Discussed with: CRNA  Anesthesia Plan Comments:         Anesthesia Quick Evaluation

## 2011-04-01 NOTE — Discharge Instructions (Signed)
Wear sling for comfort, reinforce dressing as needed,use ice to shoulder as needed. Call if problems.

## 2011-04-01 NOTE — H&P (Signed)
Jonathan Holden is an 66 y.o. male.   Chief Complaint: *pain in Rt. Shoulder with ROM. HPI: Progressive pain in Rt. Shoulder with day to day activities.  Past Medical History  Diagnosis Date  . Hypertension   . Hyperlipidemia   . History of MI (myocardial infarction) 1992  . H/O gastric ulcer   . S/P coronary angioplasty   . Loose body of right shoulder   . Arthritis HANDS AND RIGHT SHOULDER  . Coronary artery disease CARDIOLOGIST- DR Verdis Prime- LAST VISIT 6 MON AGO--  REQUESTED NOTE, EKG, ECHO, STRESS TEST    PT DENIES S & S    Past Surgical History  Procedure Date  . Laparoscopic cholecystectomy 12-16-2002  . Ercp w/ sphicterotomy 12-15-2002    AND STONE EXTRACTION  . Coronary angioplasty 1992  . Lumbar laminectomy 20 YRS AGO    L5  . Cardiovascular stress test 08-22-2008---  DR Verdis Prime    NO ISCHEMIA.     History reviewed. No pertinent family history. Social History:  reports that he quit smoking about 21 years ago. His smoking use included Cigarettes. He has a 40 pack-year smoking history. He has never used smokeless tobacco. He reports that he drinks about 4.2 ounces of alcohol per week. He reports that he does not use illicit drugs.  Allergies: No Known Allergies  Medications Prior to Admission  Medication Dose Route Frequency Provider Last Rate Last Dose  . lactated ringers infusion   Intravenous Continuous Gaetano Hawthorne, MD      . povidone-iodine (BETADINE) 7.5 % scrub   Topical Once Drucilla Schmidt, MD       Medications Prior to Admission  Medication Sig Dispense Refill  . Atorvastatin Calcium (LIPITOR PO) Take 1 tablet by mouth every evening.      . Coenzyme Q10 (COQ-10 PO) Take by mouth daily.      Marland Kitchen METOPROLOL TARTRATE PO Take 1 tablet by mouth 2 (two) times daily.      . Multiple Vitamin (MULTIVITAMIN) tablet Take 1 tablet by mouth daily.        No results found for this or any previous visit (from the past 48 hour(s)). No results  found.  Review of Systems  Constitutional: Negative.   HENT: Negative.   Eyes: Negative.   Respiratory: Negative.   Cardiovascular: Negative.   Gastrointestinal: Negative.   Genitourinary: Negative.   Musculoskeletal: Positive for joint pain.  Skin: Negative.   Neurological: Negative.   Endo/Heme/Allergies: Negative.   Psychiatric/Behavioral: Negative.     Blood pressure 138/89, pulse 69, temperature 97.6 F (36.4 C), temperature source Oral, resp. rate 20, height 6\' 3"  (1.905 m), weight 88.905 kg (196 lb), SpO2 98.00%. Physical Exam  Constitutional: He is oriented to person, place, and time. He appears well-developed and well-nourished.  HENT:  Head: Normocephalic and atraumatic.  Eyes: Conjunctivae are normal. Pupils are equal, round, and reactive to light.  Neck: Normal range of motion. Neck supple.  Cardiovascular: Normal rate and regular rhythm.   Respiratory: Effort normal and breath sounds normal.  GI: Soft. Bowel sounds are normal.  Musculoskeletal: He exhibits tenderness.  Neurological: He is alert and oriented to person, place, and time.  Skin: Skin is warm and dry.  Psychiatric: He has a normal mood and affect.     Assessment/Plan Impingement & loose bodies Rt. Shoulder.  Plan Scope with removal of loose bodies and SAD.  Skyelynn Rambeau III,Floyde Dingley L 04/01/2011, 8:09 AM

## 2011-04-02 NOTE — Op Note (Signed)
NAMEDARROLL, BREDESON              ACCOUNT NO.:  [id number]  MEDICAL RECORD NO.:  [id number]  LOCATION:                               FACILITY:  [facility]  PHYSICIAN:  Marlowe Kays, M.D.  DATE OF BIRTH:  [birth year]  DATE OF PROCEDURE:  04/01/2011 DATE OF DISCHARGE:                              OPERATIVE REPORT   PREOPERATIVE DIAGNOSES: 1. 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 2. Rotator cuff tendinopathy.  POSTOPERATIVE DIAGNOSES: 1. 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 2. Rotator cuff tendinopathy.  OPERATION: 1. Arthroscopic removal of numerous osteochondral bodies from     glenohumeral joint. 2. Arthroscopic subacromial decompression.  SURGEON:  Marlowe Kays, M.D.  ASSISTANTDruscilla Brownie. Cherlynn June.  ANESTHESIA:  General preceded by interscalene block.  PATHOLOGY AND JUSTIFICATION FOR PROCEDURE:  Painful mechanically dysfunctional right shoulder.  He does have osteoarthritic changes on plain x-ray, but also had as a minimum 2 large osteochondral loose bodies.  A CT scan was done indicating that he had numerous osteochondral loose bodies and synovial attached bodies in the shoulder joint, which mechanically was not functioning ideally.  He was advised that debridement of the joint would probably improve the shoulder function, but would not take care of all of his problems because of the arthritic changes.  DESCRIPTION OF PROCEDURE:  Interscalene block by anesthesiologist, satisfied general anesthesia, beach-chair position on the sliding frame, right shoulder girdle was prepped with DuraPrep, draped in sterile field.  Anatomy of the shoulder joint was marked out.  Time-out performed.  I infiltrated posterior lateral and anterior portals with Marcaine with adrenaline for hemostatic purposes and also  the subacromial space.  Through posterior soft spot portal, atraumatically entered the glenohumeral joint.  There was marked synovitis.  There was erosion of the humeral head.  He had too-numerous-to-count loose fragments in the joint, which I used a switching stick to make an anterior incision between the biceps tendon and subscapularis and over this, I placed initially a large metal cannula that subsequently went to a larger bore plastic cannula, so that we could utilize 5.5 great White shaver and pituitary rongeur and also baskets.  I meticulously removed all the fragments that were visible, some of which were lying free, but many of them had synovial attachments using combination of these instruments.  When we had removed all the fragments that we could find and as the larger ones were saved for the patient's family, I then redirected the scope to the subacromial space and through a lateral portal, introduced a 4.2 shaver.  He did not have much in the way of bursal tissue.  I used the 90-degree ArthroCare vaporizer to remove soft tissue from the undersurface of the acromion and then followed this with a 4-mm oval bur and went back and forth between the bur and the vaporizer until we had wide decompression.  There was no evidence for any rotator cuff tear externally.  All fluid possible was then removed. There was no unusual bleeding.  The 3 portals were closed with multiple 4-0 nylon  sutures.  Betadine, Adaptic, dry sterile dressing, shoulder immobilizer were applied.  He tolerated the procedure well and was taken to recovery room in satisfactory condition with no known complications.          ______________________________ Marlowe Kays, M.D.     JA/MEDQ  D:  04/01/2011  T:  04/02/2011  Job:  [id number]

## 2012-07-06 ENCOUNTER — Other Ambulatory Visit: Payer: Self-pay | Admitting: Dermatology

## 2012-10-12 ENCOUNTER — Other Ambulatory Visit: Payer: Self-pay | Admitting: Dermatology

## 2012-11-07 ENCOUNTER — Other Ambulatory Visit: Payer: Self-pay | Admitting: *Deleted

## 2012-11-07 DIAGNOSIS — Z79899 Other long term (current) drug therapy: Secondary | ICD-10-CM

## 2012-11-07 DIAGNOSIS — E785 Hyperlipidemia, unspecified: Secondary | ICD-10-CM

## 2012-11-15 ENCOUNTER — Other Ambulatory Visit: Payer: Self-pay | Admitting: Dermatology

## 2012-11-29 ENCOUNTER — Other Ambulatory Visit: Payer: Self-pay | Admitting: Dermatology

## 2012-12-06 ENCOUNTER — Other Ambulatory Visit (INDEPENDENT_AMBULATORY_CARE_PROVIDER_SITE_OTHER): Payer: Medicare Other

## 2012-12-06 DIAGNOSIS — Z79899 Other long term (current) drug therapy: Secondary | ICD-10-CM

## 2012-12-06 DIAGNOSIS — E785 Hyperlipidemia, unspecified: Secondary | ICD-10-CM

## 2012-12-06 LAB — LIPID PANEL
Total CHOL/HDL Ratio: 4
VLDL: 20 mg/dL (ref 0.0–40.0)

## 2012-12-06 LAB — LDL CHOLESTEROL, DIRECT: Direct LDL: 143.5 mg/dL

## 2012-12-06 LAB — ALT: ALT: 27 U/L (ref 0–53)

## 2012-12-12 ENCOUNTER — Telehealth: Payer: Self-pay

## 2012-12-12 NOTE — Telephone Encounter (Signed)
Message copied by Jarvis Newcomer on Mon Dec 12, 2012 11:16 AM ------      Message from: Verdis Prime      Created: Fri Dec 09, 2012 10:07 AM       The LDL is 143. Continue current regimen, continue exercise, tighten diet as much as possible avoiding animal fats ------

## 2012-12-12 NOTE — Telephone Encounter (Signed)
pt given lab results.The LDL is 143. Continue current regimen, continue exercise, tighten diet as much as possible avoiding animal fats.pt verbalized understanding.

## 2013-03-23 ENCOUNTER — Telehealth: Payer: Self-pay | Admitting: Interventional Cardiology

## 2013-03-23 NOTE — Telephone Encounter (Signed)
per pt rqst [facility] listed as pt pharmacy of choice.

## 2013-03-23 NOTE — Telephone Encounter (Signed)
New message     Changing presc providers.  Changing to [facility] immediately

## 2013-03-29 ENCOUNTER — Other Ambulatory Visit: Payer: Self-pay

## 2013-03-29 ENCOUNTER — Other Ambulatory Visit: Payer: Self-pay | Admitting: Interventional Cardiology

## 2013-03-29 MED ORDER — ATORVASTATIN CALCIUM 80 MG PO TABS: 80.0000 mg | ORAL_TABLET | Freq: Every day | ORAL | Status: DC

## 2013-03-29 MED ORDER — METOPROLOL TARTRATE 25 MG PO TABS: 25.0000 mg | ORAL_TABLET | Freq: Two times a day (BID) | ORAL | Status: DC

## 2013-04-11 ENCOUNTER — Other Ambulatory Visit: Payer: Self-pay | Admitting: Dermatology

## 2013-04-11 DIAGNOSIS — Z85828 Personal history of other malignant neoplasm of skin: Secondary | ICD-10-CM | POA: Diagnosis not present

## 2013-04-11 DIAGNOSIS — C44621 Squamous cell carcinoma of skin of unspecified upper limb, including shoulder: Secondary | ICD-10-CM | POA: Diagnosis not present

## 2013-04-11 DIAGNOSIS — L570 Actinic keratosis: Secondary | ICD-10-CM | POA: Diagnosis not present

## 2013-04-11 DIAGNOSIS — L280 Lichen simplex chronicus: Secondary | ICD-10-CM | POA: Diagnosis not present

## 2013-04-11 DIAGNOSIS — L819 Disorder of pigmentation, unspecified: Secondary | ICD-10-CM | POA: Diagnosis not present

## 2013-04-11 DIAGNOSIS — C44519 Basal cell carcinoma of skin of other part of trunk: Secondary | ICD-10-CM | POA: Diagnosis not present

## 2013-04-11 DIAGNOSIS — D485 Neoplasm of uncertain behavior of skin: Secondary | ICD-10-CM | POA: Diagnosis not present

## 2013-04-11 DIAGNOSIS — L219 Seborrheic dermatitis, unspecified: Secondary | ICD-10-CM | POA: Diagnosis not present

## 2013-04-11 DIAGNOSIS — L821 Other seborrheic keratosis: Secondary | ICD-10-CM | POA: Diagnosis not present

## 2013-05-16 DIAGNOSIS — H2510 Age-related nuclear cataract, unspecified eye: Secondary | ICD-10-CM | POA: Diagnosis not present

## 2013-05-22 DIAGNOSIS — H2510 Age-related nuclear cataract, unspecified eye: Secondary | ICD-10-CM | POA: Diagnosis not present

## 2013-05-22 DIAGNOSIS — Z961 Presence of intraocular lens: Secondary | ICD-10-CM

## 2013-05-22 HISTORY — DX: Presence of intraocular lens: Z96.1

## 2013-06-05 DIAGNOSIS — I2510 Atherosclerotic heart disease of native coronary artery without angina pectoris: Secondary | ICD-10-CM | POA: Diagnosis not present

## 2013-06-05 DIAGNOSIS — E785 Hyperlipidemia, unspecified: Secondary | ICD-10-CM | POA: Diagnosis not present

## 2013-06-05 DIAGNOSIS — H2510 Age-related nuclear cataract, unspecified eye: Secondary | ICD-10-CM | POA: Diagnosis not present

## 2013-06-05 DIAGNOSIS — I252 Old myocardial infarction: Secondary | ICD-10-CM | POA: Diagnosis not present

## 2013-06-05 DIAGNOSIS — Z961 Presence of intraocular lens: Secondary | ICD-10-CM

## 2013-06-05 DIAGNOSIS — I10 Essential (primary) hypertension: Secondary | ICD-10-CM | POA: Diagnosis not present

## 2013-06-05 HISTORY — DX: Presence of intraocular lens: Z96.1

## 2013-06-06 ENCOUNTER — Other Ambulatory Visit: Payer: Self-pay | Admitting: Dermatology

## 2013-06-06 DIAGNOSIS — Z85828 Personal history of other malignant neoplasm of skin: Secondary | ICD-10-CM | POA: Diagnosis not present

## 2013-06-06 DIAGNOSIS — C44621 Squamous cell carcinoma of skin of unspecified upper limb, including shoulder: Secondary | ICD-10-CM | POA: Diagnosis not present

## 2013-06-06 DIAGNOSIS — L089 Local infection of the skin and subcutaneous tissue, unspecified: Secondary | ICD-10-CM | POA: Diagnosis not present

## 2013-06-13 ENCOUNTER — Telehealth: Payer: Self-pay | Admitting: Interventional Cardiology

## 2013-06-13 NOTE — Telephone Encounter (Signed)
Patient was on Alirocumab [facility]) which finished 12/2011.  He wants to know when this will be on the market so he can get back on it.  I told him it would likely be late 07/2013, and that I would call him once it was available so we could get him started again.  He is to call me mid august if he hasn't heard from me by then.

## 2013-06-13 NOTE — Telephone Encounter (Signed)
New message     For Ysidro Evert Talk to about the availability of the cholesterol drug you discussed in the past

## 2013-06-28 ENCOUNTER — Encounter: Payer: Self-pay | Admitting: Interventional Cardiology

## 2013-07-31 ENCOUNTER — Telehealth: Payer: Self-pay | Admitting: Interventional Cardiology

## 2013-07-31 NOTE — Telephone Encounter (Signed)
Patient notified that alirocumab (the study drug he was on in [place] in the past) will go in front of FDA 08/11/13 and based on approval or indication will hopefully be able to get him started on it soon after.

## 2013-07-31 NOTE — Telephone Encounter (Signed)
New message     Talk to Jonathan Holden about the status of the new cholesterol medicatin

## 2013-08-02 ENCOUNTER — Ambulatory Visit: Payer: Medicare Other | Admitting: Interventional Cardiology

## 2013-08-08 ENCOUNTER — Encounter (INDEPENDENT_AMBULATORY_CARE_PROVIDER_SITE_OTHER): Payer: Self-pay

## 2013-08-08 ENCOUNTER — Encounter: Payer: Self-pay | Admitting: Interventional Cardiology

## 2013-08-08 ENCOUNTER — Ambulatory Visit (INDEPENDENT_AMBULATORY_CARE_PROVIDER_SITE_OTHER): Payer: Medicare Other | Admitting: Interventional Cardiology

## 2013-08-08 VITALS — BP 120/76 | HR 75 | Ht 74.0 in | Wt 198.8 lb

## 2013-08-08 DIAGNOSIS — E785 Hyperlipidemia, unspecified: Secondary | ICD-10-CM

## 2013-08-08 DIAGNOSIS — I2510 Atherosclerotic heart disease of native coronary artery without angina pectoris: Secondary | ICD-10-CM | POA: Diagnosis not present

## 2013-08-08 DIAGNOSIS — I10 Essential (primary) hypertension: Secondary | ICD-10-CM

## 2013-08-08 MED ORDER — METOPROLOL TARTRATE 25 MG PO TABS: ORAL_TABLET | ORAL | Status: DC

## 2013-08-08 NOTE — Patient Instructions (Signed)
Your physician has recommended you make the following change in your medication:   1. Increase Metoprolol to 25 mg 2 tablets in the am and 1 tablet in the pm.  Your physician wants you to follow-up in: 1 year with Dr. Tamala Julian. You will receive a reminder letter in the mail two months in advance. If you don't receive a letter, please call our office to schedule the follow-up appointment.

## 2013-08-08 NOTE — Progress Notes (Signed)
Patient ID: Jonathan Holden, male   DOB: 1946/01/11, 68 y.o.   MRN: [id number]    [address], [address], [place]  [address] Phone: [phone] Fax:  [phone]  Date:  08/08/2013   ID:  Jonathan Holden, DOB 01/30/1945, MRN [id number]  PCP:  No primary provider on file.   ASSESSMENT:  1. Coronary artery disease without angina 2. Hypertension with mild elevation at times during the day 3. Hyperlipidemia was on a lipid trial and will likely switch from atorvastatin to the SK  PLAN:  1. increase metoprolol to 50 mg a.m. and continue 25 mg in the p.m. 2. Continue nitroglycerin lifestyle 3 pericolic followup in one year   SUBJECTIVE: Jonathan Holden is a 68 y.o. male who has no complaints. He denies angina, claudication, dyspnea, and edema.   Wt Readings from Last 3 Encounters:  08/08/13 198 lb 12.8 oz (90.175 kg)  03/27/11 196 lb (88.905 kg)  03/27/11 196 lb (88.905 kg)     Past Medical History  Diagnosis Date  . Hypertension   . Hyperlipidemia   . History of MI (myocardial infarction) 1992  . H/O gastric ulcer   . S/P coronary angioplasty   . Loose body of right shoulder   . Arthritis HANDS AND RIGHT SHOULDER  . Coronary artery disease CARDIOLOGIST- DR Daneen Schick- LAST VISIT 6 MON AGO--  REQUESTED NOTE, EKG, ECHO, STRESS TEST    PT DENIES S & S    Current Outpatient Prescriptions  Medication Sig Dispense Refill  . atorvastatin (LIPITOR) 80 MG tablet TAKE 1 TABLET BY MOUTH ONCE DAILY  90 tablet  1  . Coenzyme Q10 (COQ-10 PO) Take by mouth daily.      . metoprolol tartrate (LOPRESSOR) 25 MG tablet Take 1 tablet (25 mg total) by mouth 2 (two) times daily.  180 tablet  1  . Multiple Vitamin (MULTIVITAMIN) tablet Take 1 tablet by mouth daily.       No current facility-administered medications for this visit.    Allergies:   No Known Allergies  Social History:  The patient  reports that he quit smoking about 23 years ago. His smoking use included  Cigarettes. He has a 40 pack-year smoking history. He has never used smokeless tobacco. He reports that he drinks about 4.2 ounces of alcohol per week. He reports that he does not use illicit drugs.   ROS:  Please see the history of present illness.   No blood in his room. Denies neurological complaints. No medication side effects.   All other systems reviewed and negative.   OBJECTIVE: VS:  BP 120/76  Pulse 75  Ht 6\' 2"  (1.88 m)  Wt 198 lb 12.8 oz (90.175 kg)  BMI 25.51 kg/m2 Well nourished, well developed, in no acute distress, healthy HEENT: normal Neck: JVD flat. Carotid bruit absent  Cardiac:  normal S1, S2; RRR; no murmur Lungs:  clear to auscultation bilaterally, no wheezing, rhonchi or rales Abd: soft, nontender, no hepatomegaly Ext: Edema absent. Pulses 2+ Skin: warm and dry Neuro:  CNs 2-12 intact, no focal abnormalities noted  EKG:  Small inferior Q waves and nonspecific T wave flattening. No change.       Signed, Illene Labrador III, MD 08/08/2013 3:20 PM

## 2013-08-09 ENCOUNTER — Other Ambulatory Visit: Payer: Self-pay | Admitting: Interventional Cardiology

## 2013-08-16 ENCOUNTER — Telehealth: Payer: Self-pay | Admitting: Interventional Cardiology

## 2013-08-16 NOTE — Telephone Encounter (Signed)
Called patient back and had him come in for teaching on Praluent (PCSK-9 inhibitor) as he qualifies for therapy, and he was in the original [place] trials.  Patient's LDL is 140 mg/dL despite lipitor 80 mg qd and has h/o CAD.  Paperwork filled out.

## 2013-08-16 NOTE — Telephone Encounter (Signed)
°  Please call patient. He would like to come in today and speak to you.

## 2013-08-22 ENCOUNTER — Telehealth: Payer: Self-pay | Admitting: Pharmacist

## 2013-08-22 ENCOUNTER — Other Ambulatory Visit: Payer: Self-pay | Admitting: Pharmacist

## 2013-08-22 DIAGNOSIS — E785 Hyperlipidemia, unspecified: Secondary | ICD-10-CM

## 2013-08-22 DIAGNOSIS — Z79899 Other long term (current) drug therapy: Secondary | ICD-10-CM

## 2013-08-22 MED ORDER — AMBULATORY NON FORMULARY MEDICATION: 75.0000 mg | Status: DC

## 2013-08-22 NOTE — Telephone Encounter (Signed)
Patient came in today for Praluent teaching and samples.  Patient's paperwork already filled out and submitted.  Patient counseled on Praluent use, storage, administration, etc.  He was given Praluent 75 mg samples x 2 today, and hpoefully will get his supply sent to his home over next 4 weeks.  He will call back if he doesn't have it at his home in 4 weeks so I can sample once more.  Will recheck lipid / liver in 6 weeks and see me a few days later.

## 2013-08-25 ENCOUNTER — Telehealth: Payer: Self-pay | Admitting: Pharmacist

## 2013-08-25 NOTE — Telephone Encounter (Signed)
Prior authorization for Praluent filled out and sent to [facility].  Denied as expected, so an appeal with letter of medical necessity completed and faxed to [facility].  Also faxed copy to [employer]Pral[employer] team to hopefully get patient bridged with 3-6 months of Praluent.

## 2013-09-13 ENCOUNTER — Telehealth: Payer: Self-pay | Admitting: Pharmacist

## 2013-09-13 NOTE — Telephone Encounter (Signed)
Patient called to let me know that his insurance has approved Praluent and that he got the first shipment this week.  The specialty pharmacy didn't package it correctly, so they are going to ship him another supply.  I gave him another 2 samples of Praluent 75 mg to make sure he doesn't go without it.

## 2013-09-19 ENCOUNTER — Telehealth: Payer: Self-pay | Admitting: Interventional Cardiology

## 2013-09-19 NOTE — Telephone Encounter (Signed)
Patient hasn't received his new shipment of Praluent, and the company didn't pick up the previous product (that wasn't refrigerated properly) from his home.  I have called the company to get a status report, and told the patient I would call him back once I got an answer.

## 2013-09-19 NOTE — Telephone Encounter (Signed)
New message     Pt want to talk to [facility] not tell me what he wanted

## 2013-09-27 NOTE — Telephone Encounter (Signed)
Specialty pharmacy arranged for new pick up and delivery of Praluent with family.

## 2013-09-29 ENCOUNTER — Other Ambulatory Visit: Payer: Medicare Other

## 2013-10-02 ENCOUNTER — Other Ambulatory Visit (INDEPENDENT_AMBULATORY_CARE_PROVIDER_SITE_OTHER): Payer: Medicare Other

## 2013-10-02 DIAGNOSIS — Z79899 Other long term (current) drug therapy: Secondary | ICD-10-CM

## 2013-10-02 DIAGNOSIS — E785 Hyperlipidemia, unspecified: Secondary | ICD-10-CM

## 2013-10-02 LAB — LIPID PANEL
CHOL/HDL RATIO: 4
Cholesterol: 166 mg/dL (ref 0–200)
HDL: 43.1 mg/dL (ref >39.00)
LDL CALC: 100 mg/dL — AB (ref 0–99)
NonHDL: 122.9
TRIGLYCERIDES: 117 mg/dL (ref 0.0–149.0)
VLDL: 23.4 mg/dL (ref 0.0–40.0)

## 2013-10-02 LAB — HEPATIC FUNCTION PANEL
ALBUMIN: 4 g/dL (ref 3.5–5.2)
ALT: 24 U/L (ref 0–53)
AST: 25 U/L (ref 0–37)
Alkaline Phosphatase: 49 U/L (ref 39–117)
Bilirubin, Direct: 0 mg/dL (ref 0.0–0.3)
TOTAL PROTEIN: 7 g/dL (ref 6.0–8.3)
Total Bilirubin: 0.5 mg/dL (ref 0.2–1.2)

## 2013-10-03 ENCOUNTER — Ambulatory Visit (INDEPENDENT_AMBULATORY_CARE_PROVIDER_SITE_OTHER): Payer: Medicare Other | Admitting: Pharmacist

## 2013-10-03 VITALS — Wt 203.0 lb

## 2013-10-03 DIAGNOSIS — E785 Hyperlipidemia, unspecified: Secondary | ICD-10-CM | POA: Diagnosis not present

## 2013-10-03 DIAGNOSIS — I2510 Atherosclerotic heart disease of native coronary artery without angina pectoris: Secondary | ICD-10-CM

## 2013-10-03 MED ORDER — ALIROCUMAB 150 MG/ML ~~LOC~~ SOPN: 150.0000 mg | PEN_INJECTOR | SUBCUTANEOUS | Status: DC

## 2013-10-03 NOTE — Patient Instructions (Signed)
1.  Increase Praluent to 150 mg twice monthly (every 2 weeks) 2.  Continue Lipitor 80 mg once daily. 3.  Recheck lipid panel and hepatic panel in middle of November 12/05/13 (fasting labs in AM), and see Gay Filler on 12/06/13 at 8:30 am

## 2013-10-03 NOTE — Progress Notes (Signed)
Patient with history of CAD/MI who follows up today on lipitor 80 mg qd and Praluent 75 mg q 2 weeks.  Patient was in one of the earlier [facility] Trials using alirocumab and got LDL down to ~ 25 mg/dL when he was on study drug.  He had a difficult time taking Welchol due to side effects and cost, and couldn't get to goal on Vytorin 10/80 mg qd in the past.  He started Praluent last month given LDL of 143 mg/dL on lipitor 80 mg qd at that time, and today LDL is down to 100 mg/dL.  He has been on Praluent for 4-5 weeks.  Patient states he is tolerating Praluent well and has no complaints.  LDL goal < 70 mg/dL, non-HDL goal < 100 given h/o CAD. Meds:   Lipitor 80 mg qd, Praluent 75 mg q 2 weeks.  Patient has not been as active over the past few months due to some orthopedic procedures he's had done, and he has admittedly gained ~ 5 lbs.  He is still adherent to a low fat diet however.   Former smoker, quit 23 years ago.   Labs: 09/2013:  TC 166, TG 117, HDL 43, LDL 100, non-HDL 123, LFTs normal (Lipitor 80 mg qd, Praluent 75 mg q 2 weeks)  Current Outpatient Prescriptions  Medication Sig Dispense Refill  . AMBULATORY NON FORMULARY MEDICATION Inject 75 mg into the skin every 14 (fourteen) days. Praluent (alirocumab) PCSK-9 inhibitor      . atorvastatin (LIPITOR) 80 MG tablet Take 1 tablet (80 mg total) by mouth daily at 6 PM.  90 tablet  3  . Coenzyme Q10 (COQ-10 PO) Take by mouth daily.      . metoprolol tartrate (LOPRESSOR) 25 MG tablet TAKE 2 TABLETS IN A.M AND 1 TABLETS IN P.M  270 tablet  3  . Multiple Vitamin (MULTIVITAMIN) tablet Take 1 tablet by mouth daily.       No current facility-administered medications for this visit.   No Known Allergies  No family history on file.

## 2013-10-03 NOTE — Assessment & Plan Note (Addendum)
Patient's LDL dropped from 143 mg/dL down to 100 mg/dL since starting Praluent 75 mg 4-6 weeks ago.  Patient did not get an adequate LDL reduction on 75 mg dose, so will increase to Praluent 150 mg SQ q 2 weeks.  I have contacted his specialty pharmacy and called in the new prescription for Praluent 150 mg SQ q 2 weeks.  Will have patient continue atorvastatin 80 mg qd as well.  Recheck lipid / liver in 8 weeks and see Gay Filler the following day.   Plan: 1.  Increase Praluent to 150 mg twice monthly (every 2 weeks) 2.  Continue Lipitor 80 mg once daily. 3.  Recheck lipid panel and hepatic panel in middle of November 12/05/13 (fasting labs in AM), and see Gay Filler on 12/06/13 at 8:30 am

## 2013-10-10 DIAGNOSIS — Z85828 Personal history of other malignant neoplasm of skin: Secondary | ICD-10-CM | POA: Diagnosis not present

## 2013-10-10 DIAGNOSIS — L280 Lichen simplex chronicus: Secondary | ICD-10-CM | POA: Diagnosis not present

## 2013-10-10 DIAGNOSIS — L299 Pruritus, unspecified: Secondary | ICD-10-CM | POA: Diagnosis not present

## 2013-10-10 DIAGNOSIS — L259 Unspecified contact dermatitis, unspecified cause: Secondary | ICD-10-CM | POA: Diagnosis not present

## 2013-10-27 DIAGNOSIS — Z23 Encounter for immunization: Secondary | ICD-10-CM | POA: Diagnosis not present

## 2013-12-05 ENCOUNTER — Other Ambulatory Visit: Payer: Medicare Other

## 2013-12-06 ENCOUNTER — Other Ambulatory Visit (INDEPENDENT_AMBULATORY_CARE_PROVIDER_SITE_OTHER): Payer: Medicare Other | Admitting: *Deleted

## 2013-12-06 ENCOUNTER — Ambulatory Visit: Payer: Medicare Other | Admitting: Pharmacist

## 2013-12-06 DIAGNOSIS — E785 Hyperlipidemia, unspecified: Secondary | ICD-10-CM | POA: Diagnosis not present

## 2013-12-06 LAB — HEPATIC FUNCTION PANEL
ALBUMIN: 4.6 g/dL (ref 3.5–5.2)
ALK PHOS: 54 U/L (ref 39–117)
ALT: 22 U/L (ref 0–53)
AST: 24 U/L (ref 0–37)
Bilirubin, Direct: 0.2 mg/dL (ref 0.0–0.3)
TOTAL PROTEIN: 7.4 g/dL (ref 6.0–8.3)
Total Bilirubin: 0.8 mg/dL (ref 0.2–1.2)

## 2013-12-06 LAB — LIPID PANEL
CHOLESTEROL: 110 mg/dL (ref 0–200)
HDL: 48.8 mg/dL (ref >39.00)
LDL Cholesterol: 40 mg/dL (ref 0–99)
NONHDL: 61.2
TRIGLYCERIDES: 104 mg/dL (ref 0.0–149.0)
Total CHOL/HDL Ratio: 2
VLDL: 20.8 mg/dL (ref 0.0–40.0)

## 2013-12-08 ENCOUNTER — Telehealth: Payer: Self-pay

## 2013-12-08 NOTE — Telephone Encounter (Signed)
Called to give pt lab results.lmtcb  

## 2013-12-08 NOTE — Telephone Encounter (Signed)
-----   Message from Sinclair Grooms, MD sent at 12/06/2013  4:47 PM EST ----- Great Lipids. Is he on a PSK-9?

## 2013-12-11 ENCOUNTER — Telehealth: Payer: Self-pay | Admitting: Interventional Cardiology

## 2013-12-11 NOTE — Telephone Encounter (Signed)
Follow up          Pt returning nurse call

## 2013-12-11 NOTE — Telephone Encounter (Signed)
Called to give pt lab results.lmtcb  

## 2013-12-11 NOTE — Telephone Encounter (Signed)
New message  ° ° °Returning call back to nurse.  °

## 2013-12-12 ENCOUNTER — Ambulatory Visit (INDEPENDENT_AMBULATORY_CARE_PROVIDER_SITE_OTHER): Payer: Medicare Other | Admitting: Pharmacist

## 2013-12-12 ENCOUNTER — Other Ambulatory Visit: Payer: Medicare Other

## 2013-12-12 VITALS — Wt 210.0 lb

## 2013-12-12 DIAGNOSIS — I2510 Atherosclerotic heart disease of native coronary artery without angina pectoris: Secondary | ICD-10-CM | POA: Diagnosis not present

## 2013-12-12 DIAGNOSIS — E785 Hyperlipidemia, unspecified: Secondary | ICD-10-CM

## 2013-12-12 NOTE — Progress Notes (Signed)
Patient with history of CAD/MI who follows up today on lipitor 80 mg qd and Praluent 150 mg q 2 weeks.  Patient was in one of the earlier [facility] Trials using alirocumab and got LDL down to ~ 25 mg/dL when he was on study drug.  He had a difficult time taking Welchol due to side effects and cost, and couldn't get to goal on Vytorin 10/80 mg qd in the past.  He started Praluent in August and had a baseline LDL of 143 mg/dL on lipitor 80 mg qd at that time.  After 4 weeks of Praluent, his LDL dropped to 100mg /dL.  He was then increased to the 150mg  dose.  He has taken 1 dose of the 150mg  prior to this labwork.   Patient states he is tolerating Praluent well and has no complaints.  LDL goal < 70 mg/dL, non-HDL goal < 100 given h/o CAD. Meds:   Lipitor 80 mg qd, Praluent 150 mg q 2 weeks.  Labs: 11/2013: TC 110, TG 104, HDL 49, LDL 40, non-HDL 61, LFTs normal (Lipitor 80mg  and Praluent 150mg  q 2 weeks) 09/2013:  TC 166, TG 117, HDL 43, LDL 100, non-HDL 123, LFTs normal (Lipitor 80 mg qd, Praluent 75 mg q 2 weeks)  Current Outpatient Prescriptions  Medication Sig Dispense Refill  . Alirocumab (PRALUENT) 150 MG/ML SOPN Inject 150 mg into the skin every 14 (fourteen) days. 6 pen 3  . atorvastatin (LIPITOR) 80 MG tablet Take 1 tablet (80 mg total) by mouth daily at 6 PM. 90 tablet 3  . Coenzyme Q10 (COQ-10 PO) Take by mouth daily.    . metoprolol tartrate (LOPRESSOR) 25 MG tablet TAKE 2 TABLETS IN A.M AND 1 TABLETS IN P.M 270 tablet 3  . Multiple Vitamin (MULTIVITAMIN) tablet Take 1 tablet by mouth daily.     No current facility-administered medications for this visit.   No Known Allergies

## 2013-12-12 NOTE — Assessment & Plan Note (Signed)
Pt's LDL continues to improve with the increased dose of Praluent.  He is tolerating therapy with no problems.  Will continue with the current dose and recheck labs in 6 months.

## 2013-12-12 NOTE — Telephone Encounter (Signed)
3rd attempt.lmom.[facility].pt had lipid clinic appt with Sally,Pharm D today

## 2013-12-12 NOTE — Patient Instructions (Signed)
Continue Praluent 150mg  every 2 weeks and Lipitor 80mg  daily.  Recheck labs in 6 months.

## 2013-12-12 NOTE — Telephone Encounter (Signed)
-----   Message from Sinclair Grooms, MD sent at 12/06/2013  4:47 PM EST ----- Great Lipids. Is he on a PSK-9?

## 2014-01-02 ENCOUNTER — Telehealth: Payer: Self-pay | Admitting: Interventional Cardiology

## 2014-01-02 NOTE — Telephone Encounter (Signed)
New Msg    Please contact pt at [phone] pt would not give details.

## 2014-01-02 NOTE — Telephone Encounter (Signed)
Spoke with pt.  He is having to fill out new paperwork for Praluent and wanted clarification on what information was included in his original application.

## 2014-02-16 DIAGNOSIS — S63652A Sprain of metacarpophalangeal joint of right middle finger, initial encounter: Secondary | ICD-10-CM | POA: Diagnosis not present

## 2014-02-17 DIAGNOSIS — S73191A Other sprain of right hip, initial encounter: Secondary | ICD-10-CM | POA: Diagnosis not present

## 2014-03-14 ENCOUNTER — Telehealth: Payer: Self-pay | Admitting: Interventional Cardiology

## 2014-03-14 NOTE — Telephone Encounter (Signed)
New Msg         Pt calling. No details given.   Please return pt call.

## 2014-03-14 NOTE — Telephone Encounter (Signed)
Spoke with pt.  He is having difficulty getting more than a 30 day supply of his Praluent from the patient assistance program.  He states the company told him that they faxed information to our office earlier this week to help change the process.  I will look for the fax and try to investigate issue

## 2014-04-04 DIAGNOSIS — Z86010 Personal history of colonic polyps: Secondary | ICD-10-CM | POA: Diagnosis not present

## 2014-04-04 DIAGNOSIS — K625 Hemorrhage of anus and rectum: Secondary | ICD-10-CM | POA: Diagnosis not present

## 2014-04-04 DIAGNOSIS — L309 Dermatitis, unspecified: Secondary | ICD-10-CM | POA: Diagnosis not present

## 2014-04-04 DIAGNOSIS — R109 Unspecified abdominal pain: Secondary | ICD-10-CM | POA: Diagnosis not present

## 2014-04-05 DIAGNOSIS — W57XXXA Bitten or stung by nonvenomous insect and other nonvenomous arthropods, initial encounter: Secondary | ICD-10-CM | POA: Diagnosis not present

## 2014-04-05 DIAGNOSIS — K625 Hemorrhage of anus and rectum: Secondary | ICD-10-CM | POA: Diagnosis not present

## 2014-04-05 DIAGNOSIS — R21 Rash and other nonspecific skin eruption: Secondary | ICD-10-CM | POA: Diagnosis not present

## 2014-04-10 DIAGNOSIS — K621 Rectal polyp: Secondary | ICD-10-CM | POA: Diagnosis not present

## 2014-04-10 DIAGNOSIS — D124 Benign neoplasm of descending colon: Secondary | ICD-10-CM | POA: Diagnosis not present

## 2014-04-10 DIAGNOSIS — K635 Polyp of colon: Secondary | ICD-10-CM | POA: Diagnosis not present

## 2014-04-10 DIAGNOSIS — K5730 Diverticulosis of large intestine without perforation or abscess without bleeding: Secondary | ICD-10-CM | POA: Diagnosis not present

## 2014-04-10 DIAGNOSIS — K640 First degree hemorrhoids: Secondary | ICD-10-CM | POA: Diagnosis not present

## 2014-04-10 DIAGNOSIS — K625 Hemorrhage of anus and rectum: Secondary | ICD-10-CM | POA: Diagnosis not present

## 2014-04-10 DIAGNOSIS — Z86010 Personal history of colonic polyps: Secondary | ICD-10-CM | POA: Diagnosis not present

## 2014-04-16 ENCOUNTER — Other Ambulatory Visit: Payer: Self-pay | Admitting: Gastroenterology

## 2014-04-16 DIAGNOSIS — D122 Benign neoplasm of ascending colon: Secondary | ICD-10-CM | POA: Diagnosis not present

## 2014-04-16 DIAGNOSIS — K621 Rectal polyp: Secondary | ICD-10-CM | POA: Diagnosis not present

## 2014-05-28 ENCOUNTER — Encounter: Payer: Self-pay | Admitting: Interventional Cardiology

## 2014-06-05 ENCOUNTER — Other Ambulatory Visit: Payer: Medicare Other

## 2014-06-06 ENCOUNTER — Other Ambulatory Visit: Payer: Medicare Other

## 2014-06-07 ENCOUNTER — Other Ambulatory Visit (INDEPENDENT_AMBULATORY_CARE_PROVIDER_SITE_OTHER): Payer: Medicare Other | Admitting: *Deleted

## 2014-06-07 ENCOUNTER — Ambulatory Visit: Payer: Medicare Other | Admitting: Pharmacist

## 2014-06-07 ENCOUNTER — Other Ambulatory Visit: Payer: Self-pay | Admitting: *Deleted

## 2014-06-07 DIAGNOSIS — E785 Hyperlipidemia, unspecified: Secondary | ICD-10-CM

## 2014-06-07 LAB — LIPID PANEL
CHOL/HDL RATIO: 2
Cholesterol: 84 mg/dL (ref 0–200)
HDL: 37.6 mg/dL — ABNORMAL LOW (ref >39.00)
LDL Cholesterol: 16 mg/dL (ref 0–99)
NonHDL: 46.4
Triglycerides: 150 mg/dL — ABNORMAL HIGH (ref 0.0–149.0)
VLDL: 30 mg/dL (ref 0.0–40.0)

## 2014-06-07 LAB — HEPATIC FUNCTION PANEL
ALBUMIN: 4.3 g/dL (ref 3.5–5.2)
ALT: 22 U/L (ref 0–53)
AST: 24 U/L (ref 0–37)
Alkaline Phosphatase: 56 U/L (ref 39–117)
Bilirubin, Direct: 0.1 mg/dL (ref 0.0–0.3)
Total Bilirubin: 0.5 mg/dL (ref 0.2–1.2)
Total Protein: 7.2 g/dL (ref 6.0–8.3)

## 2014-06-07 NOTE — Addendum Note (Signed)
Addended by: Eulis Foster on: 06/07/2014 07:43 AM   Modules accepted: Orders

## 2014-06-08 ENCOUNTER — Ambulatory Visit (INDEPENDENT_AMBULATORY_CARE_PROVIDER_SITE_OTHER): Payer: Medicare Other | Admitting: Pharmacist

## 2014-06-08 DIAGNOSIS — E785 Hyperlipidemia, unspecified: Secondary | ICD-10-CM

## 2014-06-08 MED ORDER — METOPROLOL SUCCINATE ER 25 MG PO TB24: 25.0000 mg | ORAL_TABLET | Freq: Every day | ORAL | Status: DC

## 2014-06-08 NOTE — Progress Notes (Signed)
   HPI Patient with history of CAD/MI who follows up today on lipitor 80 mg qd and Praluent 150 mg q 2 weeks.  Patient was in one of the earlier [facility] Trials using alirocumab and got LDL down to ~ 25 mg/dL when he was on study drug.  He had a difficult time taking Welchol due to side effects and cost, and couldn't get to goal on Vytorin 10/80 mg qd in the past.  He started Praluent in August and had a baseline LDL of 143 mg/dL on lipitor 80 mg qd at that time.  After 4 weeks of Praluent, his LDL dropped to 100mg /dL.  He was then increased to the 150mg  dose.   Patient states he is tolerating Praluent well and has no complaints.  LDL goal < 70 mg/dL, non-HDL goal < 100 given h/o CAD. Meds:   Lipitor 80 mg qd, Praluent 150 mg q 2 weeks.  Labs: 05/2014: TC 84, TG 150, HDL 38, LDL 19, non-HDL 46, LFTs normal (Lipitor 80mg  and Praluent 150mg  every 2 weeks) 11/2013: TC 110, TG 104, HDL 49, LDL 40, non-HDL 61, LFTs normal (Lipitor 80mg  and Praluent 150mg  q 2 weeks) 09/2013:  TC 166, TG 117, HDL 43, LDL 100, non-HDL 123, LFTs normal (Lipitor 80 mg qd, Praluent 75 mg q 2 weeks)  Current Outpatient Prescriptions  Medication Sig Dispense Refill  . Alirocumab (PRALUENT) 150 MG/ML SOPN Inject 150 mg into the skin every 14 (fourteen) days. 6 pen 3  . atorvastatin (LIPITOR) 80 MG tablet Take 1 tablet (80 mg total) by mouth daily at 6 PM. 90 tablet 3  . Coenzyme Q10 (COQ-10 PO) Take by mouth daily.    . metoprolol tartrate (LOPRESSOR) 25 MG tablet TAKE 2 TABLETS IN A.M AND 1 TABLETS IN P.M 270 tablet 3  . Multiple Vitamin (MULTIVITAMIN) tablet Take 1 tablet by mouth daily.     No current facility-administered medications for this visit.   No Known Allergies  Assessment and Plan 1.  Hyperlipidemia- Pt's LDL now at 19.  Discussed lack of data with patient about how low is too low.  Gave him option of decreasing Praluent to 75mg  again, decreasing Lipitor or staying where we are. Pt would like to decrease  Lipitor to 40mg  daily and keep Praluent at 150mg  every 2 weeks.  Will do this and plan to recheck labs in November.    2.  Hypertension- Pt reported only taking his metoprolol tartrate once daily.  Discussed the need to take this twice daily with him given the half life of the medication.  He would prefer a once daily option so sent a new Rx for metoprolol succinate.

## 2014-06-08 NOTE — Patient Instructions (Signed)
Decrease Lipitor to 40mg  (1/2 tablet) every day.   Make sure you take your metoprolol tartrate twice a day.  I sent a new prescription for metoprolol succinate that you can take only once a day.    Recheck labs in November.

## 2014-06-12 ENCOUNTER — Telehealth: Payer: Self-pay

## 2014-06-12 NOTE — Telephone Encounter (Signed)
Will route to Dr.Smith to advise, if pt needs gxt prior to f/u app in July

## 2014-06-12 NOTE — Telephone Encounter (Signed)
If he feels well, I do not believe a treadmill test is indicated this year.

## 2014-06-12 NOTE — Telephone Encounter (Signed)
-----   Message from Aris Georgia, [facility] sent at 06/08/2014  8:57 AM EDT ----- Pt due for Dr. Tamala Julian 1-yr follow up in July.  States he normally has treadmill test every year and wants to know if he needs that again this year.  He said you could just send him a message to let him know.

## 2014-06-13 ENCOUNTER — Telehealth: Payer: Self-pay

## 2014-06-13 NOTE — Telephone Encounter (Signed)
lmom with Dr.Smith's response. If he feels well, I do not believe a treadmill test is indicated this year.

## 2014-06-13 NOTE — Telephone Encounter (Signed)
Pt aware of Dr.Smith's response. If he feels well, I do not believe a treadmill test is indicated this year Pt sts that he is doing well and agreeable with plan.  He has an appt scheduled with Dr.Smith for 9/1. He would like to be placed on a cancellation list to be called if any earlier appt becomes available. Adv him I will fwd that message to Dr.Smith;s scheduler. He verbalized understanding

## 2014-08-24 ENCOUNTER — Other Ambulatory Visit: Payer: Self-pay

## 2014-08-24 ENCOUNTER — Other Ambulatory Visit: Payer: Self-pay | Admitting: Interventional Cardiology

## 2014-08-24 MED ORDER — ATORVASTATIN CALCIUM 80 MG PO TABS: 80.0000 mg | ORAL_TABLET | Freq: Every day | ORAL | Status: DC

## 2014-09-19 DIAGNOSIS — I10 Essential (primary) hypertension: Secondary | ICD-10-CM | POA: Insufficient documentation

## 2014-09-19 DIAGNOSIS — I25119 Atherosclerotic heart disease of native coronary artery with unspecified angina pectoris: Secondary | ICD-10-CM | POA: Insufficient documentation

## 2014-09-19 DIAGNOSIS — I2510 Atherosclerotic heart disease of native coronary artery without angina pectoris: Secondary | ICD-10-CM | POA: Insufficient documentation

## 2014-09-19 NOTE — Progress Notes (Signed)
Cardiology Office Note   Date:  09/20/2014   ID:  OKIE BOGACZ, DOB 1945/05/15, MRN [id number]  PCP:  Sinclair Grooms, MD  Cardiologist:  Sinclair Grooms, MD   Chief Complaint  Patient presents with  . Coronary Artery Disease      History of Present Illness: Jonathan Holden is a 69 y.o. male who presents for CAD with prior inferior MI, hypertension, and hyperlipidemia.  He is gaining weight. He has not been as active. He is now walking again. He denies angina and dyspnea. He has not needed nitroglycerin. He denies claudication. No transient neurological complaints.  Past Medical History  Diagnosis Date  . Hypertension   . Hyperlipidemia   . History of MI (myocardial infarction) 1992  . H/O gastric ulcer   . S/P coronary angioplasty   . Loose body of right shoulder   . Arthritis HANDS AND RIGHT SHOULDER  . Coronary artery disease CARDIOLOGIST- DR Daneen Schick- LAST VISIT 6 MON AGO--  REQUESTED NOTE, EKG, ECHO, STRESS TEST    PT DENIES S & S    Past Surgical History  Procedure Laterality Date  . Laparoscopic cholecystectomy  12-16-2002  . Ercp w/ sphicterotomy  12-15-2002    AND STONE EXTRACTION  . Coronary angioplasty  1992  . Lumbar laminectomy  20 YRS AGO    L5  . Cardiovascular stress test  08-22-2008---  DR Daneen Schick    NO ISCHEMIA.      Current Outpatient Prescriptions  Medication Sig Dispense Refill  . Alirocumab (PRALUENT) 150 MG/ML SOPN Inject 150 mg into the skin every 14 (fourteen) days. 6 pen 3  . atorvastatin (LIPITOR) 20 MG tablet Take 1 tablet (20 mg total) by mouth daily at 6 PM.    . Coenzyme Q10 (COQ-10 PO) Take 1 capsule by mouth daily.     . metoprolol succinate (TOPROL XL) 25 MG 24 hr tablet Take 1 tablet (25 mg total) by mouth daily. 90 tablet 1  . Multiple Vitamin (MULTIVITAMIN) tablet Take 1 tablet by mouth daily.    Marland Kitchen lisinopril-hydrochlorothiazide (ZESTORETIC) 10-12.5 MG per tablet Take 1 tablet by mouth daily. 30 tablet 11    No current facility-administered medications for this visit.    Allergies:   Review of patient's allergies indicates no known allergies.    Social History:  The patient  reports that he quit smoking about 24 years ago. His smoking use included Cigarettes. He has a 40 pack-year smoking history. He has never used smokeless tobacco. He reports that he drinks about 4.2 oz of alcohol per week. He reports that he does not use illicit drugs.   Family History:  The patient's family history includes Emphysema in an other family member; Heart attack (age of onset: 70) in his father; Other in his brother and mother.    ROS:  Please see the history of present illness.   Otherwise, review of systems are positive for Limited by bilateral hip and right shoulder discomfort. Unable to do aerobic activity as freely as previous..   All other systems are reviewed and negative.    PHYSICAL EXAM: VS:  BP 124/86 mmHg  Pulse 82  Ht 6\' 2"  (1.88 m)  Wt 95.618 kg (210 lb 12.8 oz)  BMI 27.05 kg/m2 , BMI Body mass index is 27.05 kg/(m^2). GEN: Well nourished, well developed, in no acute distress HEENT: normal Neck: no JVD, carotid bruits, or masses Cardiac: RRR; no murmurs, rubs, or gallops,no edema  Respiratory:  clear to auscultation bilaterally, normal work of breathing GI: soft, nontender, nondistended, + BS MS: no deformity or atrophy Skin: warm and dry, no rash Neuro:  Strength and sensation are intact Psych: euthymic mood, full affect   EKG:  EKG is ordered today. The ekg ordered today demonstrates normal sinus rhythm with nonspecific T-wave abnormality. Heart rate 82 bpm.   Recent Labs: 06/07/2014: ALT 22    Lipid Panel    Component Value Date/Time   CHOL 84 06/07/2014 0743   TRIG 150.0* 06/07/2014 0743   HDL 37.60* 06/07/2014 0743   CHOLHDL 2 06/07/2014 0743   VLDL 30.0 06/07/2014 0743   LDLCALC 16 06/07/2014 0743   LDLDIRECT 143.5 12/06/2012 0909      Wt Readings from Last 3  Encounters:  09/20/14 95.618 kg (210 lb 12.8 oz)  12/12/13 95.255 kg (210 lb)  10/03/13 92.08 kg (203 lb)      Other studies Reviewed: Additional studies/ records that were reviewed today include: reviewed old healed data..    ASSESSMENT AND PLAN:  1. Coronary artery disease involving native coronary artery of native heart without angina pectoris Asymptomatic.  2. Hyperlipidemia The patient is on Praluent and high-dose atorvastatin. Last total cholesterol was less than 90.  3. Essential hypertension Elevated with my determination this morning 160/80. Millimeters mercury. His measurements at home are rarely less than 308 systolic.    Current medicines are reviewed at length with the patient today.  The patient has concerns regarding medicines.  The following changes have been made:  Decrease atorvastatin to 20 mg daily.. We also need to tighten blood pressure control. I will therefore start lisinopril HCT 10/12.5 mg daily. He will need to have a basic metabolic panel and lipid panel done in about 4-6 weeks.  Labs/ tests ordered today include:   Orders Placed This Encounter  Procedures  . Basic metabolic panel  . EKG 12-Lead     Disposition:   FU with HS in 1 year  Signed, Sinclair Grooms, MD  09/20/2014 8:44 AM    [facility] [place], [place], [place]  [address] Phone: [phone]; Fax: [phone]

## 2014-09-20 ENCOUNTER — Encounter: Payer: Self-pay | Admitting: Interventional Cardiology

## 2014-09-20 ENCOUNTER — Ambulatory Visit (INDEPENDENT_AMBULATORY_CARE_PROVIDER_SITE_OTHER): Payer: Medicare Other | Admitting: Interventional Cardiology

## 2014-09-20 VITALS — BP 124/86 | HR 82 | Ht 74.0 in | Wt 210.8 lb

## 2014-09-20 DIAGNOSIS — E785 Hyperlipidemia, unspecified: Secondary | ICD-10-CM | POA: Diagnosis not present

## 2014-09-20 DIAGNOSIS — I2510 Atherosclerotic heart disease of native coronary artery without angina pectoris: Secondary | ICD-10-CM | POA: Diagnosis not present

## 2014-09-20 DIAGNOSIS — I10 Essential (primary) hypertension: Secondary | ICD-10-CM | POA: Diagnosis not present

## 2014-09-20 MED ORDER — LISINOPRIL-HYDROCHLOROTHIAZIDE 10-12.5 MG PO TABS: 1.0000 | ORAL_TABLET | Freq: Every day | ORAL | Status: DC

## 2014-09-20 MED ORDER — ATORVASTATIN CALCIUM 20 MG PO TABS: 20.0000 mg | ORAL_TABLET | Freq: Every day | ORAL | Status: DC

## 2014-09-20 NOTE — Patient Instructions (Addendum)
Medication Instructions:  Your physician has recommended you make the following change in your medication:  1) REDUCE Atorvastatin to 20mg  daily 2) START Lisinopril HCTZ 10-12.5mg  daily. An Rx has been sent to your pharmacy   Labwork: Your physician recommends that you return for a FASTING lipid profile, bmet in 1 month  Testing/Procedures: None ordered  Follow-Up: Your physician recommends that you schedule a follow-up appointment in: 1 month with an NP/PA  Your physician wants you to follow-up in: 1 year with Dr.Smith You will receive a reminder letter in the mail two months in advance. If you don't receive a letter, please call our office to schedule the follow-up appointment.    Any Other Special Instructions Will Be Listed Below (If Applicable).

## 2014-10-03 ENCOUNTER — Telehealth: Payer: Self-pay | Admitting: Interventional Cardiology

## 2014-10-03 NOTE — Telephone Encounter (Signed)
Spoke with pt.  He was re-approved for patient assistance program for Praluent.

## 2014-10-03 NOTE — Telephone Encounter (Signed)
New message       Pt request to talk to Gay Filler only.  He would not tell me what he wanted

## 2014-10-08 DIAGNOSIS — L820 Inflamed seborrheic keratosis: Secondary | ICD-10-CM | POA: Diagnosis not present

## 2014-10-08 DIAGNOSIS — L821 Other seborrheic keratosis: Secondary | ICD-10-CM | POA: Diagnosis not present

## 2014-10-08 DIAGNOSIS — Z85828 Personal history of other malignant neoplasm of skin: Secondary | ICD-10-CM | POA: Diagnosis not present

## 2014-10-08 DIAGNOSIS — L400 Psoriasis vulgaris: Secondary | ICD-10-CM | POA: Diagnosis not present

## 2014-10-19 ENCOUNTER — Other Ambulatory Visit: Payer: Medicare Other

## 2014-10-19 ENCOUNTER — Encounter: Payer: Self-pay | Admitting: Nurse Practitioner

## 2014-10-19 ENCOUNTER — Ambulatory Visit (INDEPENDENT_AMBULATORY_CARE_PROVIDER_SITE_OTHER): Payer: Medicare Other | Admitting: Nurse Practitioner

## 2014-10-19 VITALS — BP 158/98 | HR 70 | Ht 74.0 in | Wt 214.8 lb

## 2014-10-19 DIAGNOSIS — I2510 Atherosclerotic heart disease of native coronary artery without angina pectoris: Secondary | ICD-10-CM

## 2014-10-19 DIAGNOSIS — E785 Hyperlipidemia, unspecified: Secondary | ICD-10-CM | POA: Diagnosis not present

## 2014-10-19 DIAGNOSIS — I10 Essential (primary) hypertension: Secondary | ICD-10-CM | POA: Diagnosis not present

## 2014-10-19 LAB — BASIC METABOLIC PANEL
BUN: 14 mg/dL (ref 6–23)
CALCIUM: 9.8 mg/dL (ref 8.4–10.5)
CO2: 32 mEq/L (ref 19–32)
Chloride: 100 mEq/L (ref 96–112)
Creatinine, Ser: 0.87 mg/dL (ref 0.40–1.50)
GFR: 92.15 mL/min (ref >60.00)
GLUCOSE: 108 mg/dL — AB (ref 70–99)
POTASSIUM: 4.5 meq/L (ref 3.5–5.1)
SODIUM: 139 meq/L (ref 135–145)

## 2014-10-19 LAB — HEPATIC FUNCTION PANEL
ALBUMIN: 4.4 g/dL (ref 3.5–5.2)
ALT: 19 U/L (ref 0–53)
AST: 23 U/L (ref 0–37)
Alkaline Phosphatase: 56 U/L (ref 39–117)
BILIRUBIN TOTAL: 0.5 mg/dL (ref 0.2–1.2)
Bilirubin, Direct: 0.1 mg/dL (ref 0.0–0.3)
TOTAL PROTEIN: 7.4 g/dL (ref 6.0–8.3)

## 2014-10-19 NOTE — Progress Notes (Signed)
CARDIOLOGY OFFICE NOTE  Date:  10/19/2014    Jonathan Holden Date of Birth: March 23, 1945 Medical Record #[id number]  PCP:  Sinclair Grooms, MD  Cardiologist:  Tamala Julian    Chief Complaint  Patient presents with  . Coronary Artery Disease    Follow up visit - seen for Dr. Tamala Julian  . Hyperlipidemia  . Hypertension    History of Present Illness: Jonathan Holden is a 69 y.o. male who presents today for a follow up visit. Seen for Dr. Tamala Julian. He has CAD with remote inferior MI, HTN and HLD. He is followed in the lipid clinic here.   Seen earlier this month. BP was elevated here and at home and medicines were adjusted. Lisinopril HCT was added.  Comes back today. Here alone. Sounds like BP is still up in the morning - mostly prior to taking his medicines. Checks only randomly at other times and it has been ok. No chest pain. Not short of breath. Gets a little dizzy if gets up too quickly and is attributing this to the Lisinopril HCT. Does not drink enough water - he admits that. Not using too much salt.   Past Medical History  Diagnosis Date  . Hypertension   . Hyperlipidemia   . History of MI (myocardial infarction) 1992  . H/O gastric ulcer   . S/P coronary angioplasty   . Loose body of right shoulder   . Arthritis HANDS AND RIGHT SHOULDER  . Coronary artery disease CARDIOLOGIST- DR Daneen Schick- LAST VISIT 6 MON AGO--  REQUESTED NOTE, EKG, ECHO, STRESS TEST    PT DENIES S & S    Past Surgical History  Procedure Laterality Date  . Laparoscopic cholecystectomy  12-16-2002  . Ercp w/ sphicterotomy  12-15-2002    AND STONE EXTRACTION  . Coronary angioplasty  1992  . Lumbar laminectomy  20 YRS AGO    L5  . Cardiovascular stress test  08-22-2008---  DR Daneen Schick    NO ISCHEMIA.      Medications: Current Outpatient Prescriptions  Medication Sig Dispense Refill  . Alirocumab (PRALUENT) 150 MG/ML SOPN Inject 150 mg into the skin every 14 (fourteen) days. 6 pen 3  .  atorvastatin (LIPITOR) 80 MG tablet Take 40 mg by mouth every other day.    . Coenzyme Q10 (COQ-10 PO) Take 1 capsule by mouth daily.     Marland Kitchen lisinopril-hydrochlorothiazide (ZESTORETIC) 10-12.5 MG per tablet Take 1 tablet by mouth daily. 30 tablet 11  . metoprolol succinate (TOPROL XL) 25 MG 24 hr tablet Take 1 tablet (25 mg total) by mouth daily. 90 tablet 1  . Multiple Vitamin (MULTIVITAMIN) tablet Take 1 tablet by mouth daily.    Marland Kitchen triamcinolone cream (KENALOG) 0.1 % Apply 1 application topically 2 (two) times daily.      No current facility-administered medications for this visit.    Allergies: No Known Allergies  Social History: The patient  reports that he quit smoking about 24 years ago. His smoking use included Cigarettes. He has a 40 pack-year smoking history. He has never used smokeless tobacco. He reports that he drinks about 4.2 oz of alcohol per week. He reports that he does not use illicit drugs.   Family History: The patient's family history includes Emphysema in an other family member; Heart attack (age of onset: 34) in his father; Other in his brother and mother.   Review of Systems: Please see the history of present illness.   Otherwise, the  review of systems is positive for none.   All other systems are reviewed and negative.   Physical Exam: VS:  BP 158/98 mmHg  Pulse 70  Ht 6\' 2"  (1.88 m)  Wt 214 lb 12.8 oz (97.433 kg)  BMI 27.57 kg/m2  SpO2 97% .  BMI Body mass index is 27.57 kg/(m^2).  Wt Readings from Last 3 Encounters:  10/19/14 214 lb 12.8 oz (97.433 kg)  09/20/14 210 lb 12.8 oz (95.618 kg)  12/12/13 210 lb (95.255 kg)    General: Pleasant. Well developed, well nourished and in no acute distress.  HEENT: Normal. Neck: Supple, no JVD, carotid bruits, or masses noted.  Cardiac: Regular rate and rhythm. No murmurs, rubs, or gallops. No edema.  Respiratory:  Lungs are clear to auscultation bilaterally with normal work of breathing.  GI: Soft and nontender.   MS: No deformity or atrophy. Gait and ROM intact. Skin: Warm and dry. Color is normal.  Neuro:  Strength and sensation are intact and no gross focal deficits noted.  Psych: Alert, appropriate and with normal affect.   LABORATORY DATA:  EKG:  EKG is not ordered today.  Lab Results  Component Value Date   WBC 5.9 09/15/2008   HGB 15.3 04/01/2011   HCT 45.0 04/01/2011   PLT 156 09/15/2008   GLUCOSE 106* 04/01/2011   CHOL 84 06/07/2014   TRIG 150.0* 06/07/2014   HDL 37.60* 06/07/2014   LDLDIRECT 143.5 12/06/2012   LDLCALC 16 06/07/2014   ALT 22 06/07/2014   AST 24 06/07/2014   NA 144 04/01/2011   K 4.4 04/01/2011   CL 106 04/01/2011   CREATININE 0.80 04/01/2011   BUN 12 04/01/2011   CO2 25 09/15/2008   INR 1.1 09/15/2008    BNP (last 3 results) No results for input(s): BNP in the last 8760 hours.  ProBNP (last 3 results) No results for input(s): PROBNP in the last 8760 hours.   Other Studies Reviewed Today:   Assessment/Plan: 1. HTN - BP probably not at goal - he is hesitant to increase his medicine further or have additional medicines - he would like to continue to monitor and get a better sense of how his BP does over the course of an entire day rather than just one particular time of day. Will increase his water. He will call Dr. Thompson Caul assistant - Lattie Haw with an update in 2 to 3 weeks.   2. HLD - labs being checked today  3. CAD - no active symptoms. Needs to continue with CV risk factor management.   Current medicines are reviewed with the patient today.  The patient does not have concerns regarding medicines other than what has been noted above.  The following changes have been made:  See above.  Labs/ tests ordered today include:   No orders of the defined types were placed in this encounter.     Disposition:   FU with Dr. Tamala Julian as planned.   Patient is agreeable to this plan and will call if any problems develop in the interim.   Signed: Burtis Junes, RN, ANP-C 10/19/2014 9:18 AM  [facility] [address] [place], [place]  [address] Phone: [phone] Fax: [phone]

## 2014-10-19 NOTE — Patient Instructions (Addendum)
We will be checking the following labs today - fasting lipids, HPF and BMET   Medication Instructions:    Continue with your current medicines.     Testing/Procedures To Be Arranged:  N/A  Follow-Up:   See Dr. Tamala Julian in September of 2017  Call Rush Farmer who works with Dr. Tamala Julian about your BP in the next 2 to 3 weeks    Other Special Instructions:   N/A  Call the [place] office at [phone] if you have any questions, problems or concerns.

## 2014-10-22 ENCOUNTER — Other Ambulatory Visit (INDEPENDENT_AMBULATORY_CARE_PROVIDER_SITE_OTHER): Payer: Medicare Other

## 2014-10-22 DIAGNOSIS — E785 Hyperlipidemia, unspecified: Secondary | ICD-10-CM

## 2014-10-22 LAB — LIPID PANEL
CHOL/HDL RATIO: 2
Cholesterol: 125 mg/dL (ref 0–200)
HDL: 55.3 mg/dL (ref >39.00)
LDL CALC: 48 mg/dL (ref 0–99)
NonHDL: 70.09
TRIGLYCERIDES: 111 mg/dL (ref 0.0–149.0)
VLDL: 22.2 mg/dL (ref 0.0–40.0)

## 2014-10-23 ENCOUNTER — Ambulatory Visit: Payer: Medicare Other | Admitting: Nurse Practitioner

## 2014-10-23 ENCOUNTER — Other Ambulatory Visit: Payer: Medicare Other

## 2014-10-25 ENCOUNTER — Telehealth: Payer: Self-pay

## 2014-10-25 DIAGNOSIS — I10 Essential (primary) hypertension: Secondary | ICD-10-CM

## 2014-10-25 NOTE — Telephone Encounter (Signed)
Pt aware of lab results (bmet,lft,lipid).  Pt sts that when he saw Kathrene Alu a couple of days ago for f/u bp visit , she suggested that he continue to monitor his bp, if his bp is consistently elevated he should double his lisinopril hctz dose for a couple of days to see if bp readings improve. Pt st that while taking lisinopril hctz 10-12.5mg  daily. His bp would run 150's/90's. Pt sts that he doubled the dose for 2 days and his bp was 120's/70's both days.adv pt that I will fwd the update to Dr.Smith and call back with his response.pt verbalized understanding.

## 2014-10-25 NOTE — Telephone Encounter (Signed)
-----   Message from Belva Crome, MD sent at 10/25/2014  7:19 AM EDT ----- Very good. Ordered by lipid clinic?

## 2014-10-26 NOTE — Telephone Encounter (Signed)
We should change the prescription to Lisinopril HCTZ 20/12.5 mg daily. He will need BMET 7-10 days later.

## 2014-10-29 MED ORDER — LISINOPRIL-HYDROCHLOROTHIAZIDE 20-12.5 MG PO TABS: 1.0000 | ORAL_TABLET | Freq: Every day | ORAL | Status: DC

## 2014-10-29 NOTE — Telephone Encounter (Signed)
Pt aware of Dr.Smith's recommendation. We should change the prescription to Lisinopril HCTZ 20/12.5 mg daily. He will need BMET 7-10 days later.  Rx sent to pt pharmacy. Lab appt scheduled for 10/28. Pt verbalized understanding.

## 2014-11-16 ENCOUNTER — Other Ambulatory Visit (INDEPENDENT_AMBULATORY_CARE_PROVIDER_SITE_OTHER): Payer: Medicare Other

## 2014-11-16 DIAGNOSIS — I10 Essential (primary) hypertension: Secondary | ICD-10-CM

## 2014-11-16 LAB — BASIC METABOLIC PANEL
BUN: 15 mg/dL (ref 7–25)
CHLORIDE: 100 mmol/L (ref 98–110)
CO2: 27 mmol/L (ref 20–31)
CREATININE: 0.93 mg/dL (ref 0.70–1.18)
Calcium: 9.9 mg/dL (ref 8.6–10.3)
Glucose, Bld: 103 mg/dL — ABNORMAL HIGH (ref 65–99)
Potassium: 4.1 mmol/L (ref 3.5–5.3)
SODIUM: 137 mmol/L (ref 135–146)

## 2014-11-27 ENCOUNTER — Telehealth: Payer: Self-pay

## 2014-11-27 ENCOUNTER — Telehealth: Payer: Self-pay | Admitting: Interventional Cardiology

## 2014-11-27 NOTE — Telephone Encounter (Signed)
New message      Returning a call to a nurse to get lab results

## 2014-11-27 NOTE — Telephone Encounter (Signed)
Lmom. Labs are stable.

## 2014-11-27 NOTE — Telephone Encounter (Signed)
Pt aware of lab results with verbal understanding. 

## 2014-11-27 NOTE — Telephone Encounter (Signed)
-----   Message from Belva Crome, MD sent at 11/23/2014  2:34 PM EDT ----- stable

## 2015-01-07 DIAGNOSIS — Z23 Encounter for immunization: Secondary | ICD-10-CM | POA: Diagnosis not present

## 2015-03-18 DIAGNOSIS — H3322 Serous retinal detachment, left eye: Secondary | ICD-10-CM | POA: Diagnosis not present

## 2015-03-18 DIAGNOSIS — H538 Other visual disturbances: Secondary | ICD-10-CM | POA: Diagnosis not present

## 2015-03-18 DIAGNOSIS — H00016 Hordeolum externum left eye, unspecified eyelid: Secondary | ICD-10-CM | POA: Diagnosis not present

## 2015-03-18 DIAGNOSIS — Z961 Presence of intraocular lens: Secondary | ICD-10-CM | POA: Diagnosis not present

## 2015-03-18 DIAGNOSIS — H43392 Other vitreous opacities, left eye: Secondary | ICD-10-CM | POA: Diagnosis not present

## 2015-03-18 HISTORY — DX: Serous retinal detachment, left eye: H33.22

## 2015-03-22 DIAGNOSIS — H33002 Unspecified retinal detachment with retinal break, left eye: Secondary | ICD-10-CM | POA: Diagnosis not present

## 2015-03-22 DIAGNOSIS — I10 Essential (primary) hypertension: Secondary | ICD-10-CM | POA: Diagnosis not present

## 2015-03-22 DIAGNOSIS — Z79899 Other long term (current) drug therapy: Secondary | ICD-10-CM | POA: Diagnosis not present

## 2015-03-22 DIAGNOSIS — I252 Old myocardial infarction: Secondary | ICD-10-CM | POA: Diagnosis not present

## 2015-03-22 DIAGNOSIS — Z87891 Personal history of nicotine dependence: Secondary | ICD-10-CM | POA: Diagnosis not present

## 2015-03-22 DIAGNOSIS — H3322 Serous retinal detachment, left eye: Secondary | ICD-10-CM | POA: Diagnosis not present

## 2015-03-22 DIAGNOSIS — E785 Hyperlipidemia, unspecified: Secondary | ICD-10-CM | POA: Diagnosis not present

## 2015-04-29 DIAGNOSIS — H3322 Serous retinal detachment, left eye: Secondary | ICD-10-CM | POA: Diagnosis not present

## 2015-05-28 ENCOUNTER — Telehealth: Payer: Self-pay | Admitting: Pharmacist

## 2015-05-28 NOTE — Telephone Encounter (Signed)
Pt was calling to speak with Gay Filler. Please f/u with him

## 2015-05-28 NOTE — Telephone Encounter (Signed)
Spoke with pt.  He was inquiring about any new patient assistance options for Praluent.  Unfortunately there are no new options available.  Agreed to help supplement with samples if he was willing to at least get a prescription every few months from the pharmacy.  He is agreeable.  He has 4 more weeks of medication left and will call when he is running low.

## 2015-05-30 DIAGNOSIS — H3522 Other non-diabetic proliferative retinopathy, left eye: Secondary | ICD-10-CM | POA: Diagnosis not present

## 2015-05-30 DIAGNOSIS — H3322 Serous retinal detachment, left eye: Secondary | ICD-10-CM | POA: Diagnosis not present

## 2015-05-30 DIAGNOSIS — Z961 Presence of intraocular lens: Secondary | ICD-10-CM | POA: Diagnosis not present

## 2015-05-31 DIAGNOSIS — Z79899 Other long term (current) drug therapy: Secondary | ICD-10-CM | POA: Diagnosis not present

## 2015-05-31 DIAGNOSIS — H3322 Serous retinal detachment, left eye: Secondary | ICD-10-CM | POA: Diagnosis not present

## 2015-05-31 DIAGNOSIS — I252 Old myocardial infarction: Secondary | ICD-10-CM | POA: Diagnosis not present

## 2015-05-31 DIAGNOSIS — H3522 Other non-diabetic proliferative retinopathy, left eye: Secondary | ICD-10-CM | POA: Diagnosis not present

## 2015-05-31 DIAGNOSIS — E785 Hyperlipidemia, unspecified: Secondary | ICD-10-CM | POA: Diagnosis not present

## 2015-05-31 DIAGNOSIS — I10 Essential (primary) hypertension: Secondary | ICD-10-CM | POA: Diagnosis not present

## 2015-05-31 DIAGNOSIS — H3342 Traction detachment of retina, left eye: Secondary | ICD-10-CM | POA: Diagnosis not present

## 2015-05-31 DIAGNOSIS — Z87891 Personal history of nicotine dependence: Secondary | ICD-10-CM | POA: Diagnosis not present

## 2015-06-19 ENCOUNTER — Telehealth: Payer: Self-pay | Admitting: Pharmacist

## 2015-06-19 NOTE — Telephone Encounter (Signed)
See previous phone note from 5/9. Pt coming to pick up 2 Praluent 150mg /mL pens today.

## 2015-06-19 NOTE — Telephone Encounter (Signed)
New message      Pt request to talk to Jonathan Holden.  He would not tell me what he wanted.

## 2015-06-24 ENCOUNTER — Telehealth: Payer: Self-pay | Admitting: Interventional Cardiology

## 2015-06-24 NOTE — Telephone Encounter (Signed)
F/u Message  Pt returning RN call. Pt states he wanted to change is appt to wed 6/7. Please call back to discuss

## 2015-06-24 NOTE — Telephone Encounter (Signed)
Returned pt's call, needs Praluent samples. Was supposed to come in last week but came after 5 and we were closed. Will pick up samples tomorrow morning.

## 2015-06-24 NOTE — Telephone Encounter (Signed)
New message    Pt c/o medication issue:  1. Name of Medication: praluent  2. How are you currently taking this medication (dosage and times per day)? 150 mg po  3. Are you having a reaction (difficulty breathing--STAT)? no  4. What is your medication issue? The pt states Gay Filler is aware of the issue.

## 2015-06-25 NOTE — Telephone Encounter (Signed)
Follow up      Talk to [facility].  Pt want to come in tomorrow am.  Please call him and tell him what time to come.  He would not allow me to schedule him an appt.

## 2015-06-25 NOTE — Telephone Encounter (Signed)
Pt just needs Praluent samples. Has called 3 times to change when he is picking up samples. Will now come 6/7 in the AM to pick them up.

## 2015-07-08 DIAGNOSIS — H3522 Other non-diabetic proliferative retinopathy, left eye: Secondary | ICD-10-CM | POA: Diagnosis not present

## 2015-07-08 DIAGNOSIS — H3322 Serous retinal detachment, left eye: Secondary | ICD-10-CM | POA: Diagnosis not present

## 2015-07-08 HISTORY — DX: Other non-diabetic proliferative retinopathy, left eye: H35.22

## 2015-07-17 ENCOUNTER — Telehealth: Payer: Self-pay | Admitting: Interventional Cardiology

## 2015-07-17 MED ORDER — ALIROCUMAB 150 MG/ML ~~LOC~~ SOPN: 150.0000 mg | PEN_INJECTOR | SUBCUTANEOUS | Status: DC

## 2015-07-17 NOTE — Telephone Encounter (Signed)
New Message  Pt call requesting to speak with RN about his prescription. Pt states pharmacy has no listing of his medication in there system. There was an attempt to make the appt for the refills. But pt stated he wanted to speak with RN about the matter. please call back to discuss

## 2015-07-17 NOTE — Telephone Encounter (Signed)
Spoke with pt.  New Rx sent to [facility] for patient.

## 2015-07-17 NOTE — Telephone Encounter (Signed)
Returned pt call. Pt sts that he needs a refill for his Praluent. He called his pharmacy and was told they no longer have this medication on file for him. Adv pt that the medication is managed by our pharmacist. Pt would like to speak with Elberta Leatherwood, Pharm-D only. Adv pt that I will fwd Gay Filler the message. Pt voiced appreciation and verbalized understanding.

## 2015-08-21 ENCOUNTER — Telehealth: Payer: Self-pay | Admitting: Interventional Cardiology

## 2015-08-21 NOTE — Telephone Encounter (Signed)
error 

## 2015-10-23 DIAGNOSIS — H43821 Vitreomacular adhesion, right eye: Secondary | ICD-10-CM | POA: Diagnosis not present

## 2015-10-23 DIAGNOSIS — H35373 Puckering of macula, bilateral: Secondary | ICD-10-CM | POA: Diagnosis not present

## 2015-10-23 DIAGNOSIS — H3322 Serous retinal detachment, left eye: Secondary | ICD-10-CM | POA: Diagnosis not present

## 2015-10-23 DIAGNOSIS — Z8669 Personal history of other diseases of the nervous system and sense organs: Secondary | ICD-10-CM | POA: Diagnosis not present

## 2015-10-23 DIAGNOSIS — H3522 Other non-diabetic proliferative retinopathy, left eye: Secondary | ICD-10-CM | POA: Diagnosis not present

## 2015-10-23 DIAGNOSIS — H35372 Puckering of macula, left eye: Secondary | ICD-10-CM | POA: Diagnosis not present

## 2015-11-06 ENCOUNTER — Other Ambulatory Visit: Payer: Self-pay | Admitting: Interventional Cardiology

## 2015-11-06 DIAGNOSIS — I10 Essential (primary) hypertension: Secondary | ICD-10-CM

## 2015-11-18 DIAGNOSIS — L219 Seborrheic dermatitis, unspecified: Secondary | ICD-10-CM | POA: Diagnosis not present

## 2015-11-18 DIAGNOSIS — L853 Xerosis cutis: Secondary | ICD-10-CM | POA: Diagnosis not present

## 2015-11-18 DIAGNOSIS — L578 Other skin changes due to chronic exposure to nonionizing radiation: Secondary | ICD-10-CM | POA: Diagnosis not present

## 2015-11-18 DIAGNOSIS — L821 Other seborrheic keratosis: Secondary | ICD-10-CM | POA: Diagnosis not present

## 2015-11-18 DIAGNOSIS — L300 Nummular dermatitis: Secondary | ICD-10-CM | POA: Diagnosis not present

## 2015-12-03 DIAGNOSIS — L578 Other skin changes due to chronic exposure to nonionizing radiation: Secondary | ICD-10-CM | POA: Diagnosis not present

## 2015-12-03 DIAGNOSIS — D485 Neoplasm of uncertain behavior of skin: Secondary | ICD-10-CM | POA: Diagnosis not present

## 2015-12-03 DIAGNOSIS — D489 Neoplasm of uncertain behavior, unspecified: Secondary | ICD-10-CM | POA: Diagnosis not present

## 2015-12-03 DIAGNOSIS — L858 Other specified epidermal thickening: Secondary | ICD-10-CM | POA: Diagnosis not present

## 2015-12-03 DIAGNOSIS — L570 Actinic keratosis: Secondary | ICD-10-CM | POA: Diagnosis not present

## 2015-12-18 DIAGNOSIS — H3522 Other non-diabetic proliferative retinopathy, left eye: Secondary | ICD-10-CM | POA: Diagnosis not present

## 2015-12-18 DIAGNOSIS — H35372 Puckering of macula, left eye: Secondary | ICD-10-CM | POA: Diagnosis not present

## 2015-12-31 DIAGNOSIS — L570 Actinic keratosis: Secondary | ICD-10-CM | POA: Diagnosis not present

## 2015-12-31 DIAGNOSIS — L300 Nummular dermatitis: Secondary | ICD-10-CM | POA: Diagnosis not present

## 2015-12-31 DIAGNOSIS — L218 Other seborrheic dermatitis: Secondary | ICD-10-CM | POA: Diagnosis not present

## 2015-12-31 DIAGNOSIS — L858 Other specified epidermal thickening: Secondary | ICD-10-CM | POA: Diagnosis not present

## 2015-12-31 DIAGNOSIS — L821 Other seborrheic keratosis: Secondary | ICD-10-CM | POA: Diagnosis not present

## 2015-12-31 DIAGNOSIS — L820 Inflamed seborrheic keratosis: Secondary | ICD-10-CM | POA: Diagnosis not present

## 2015-12-31 DIAGNOSIS — L814 Other melanin hyperpigmentation: Secondary | ICD-10-CM | POA: Diagnosis not present

## 2016-01-01 ENCOUNTER — Telehealth: Payer: Self-pay | Admitting: Interventional Cardiology

## 2016-01-01 NOTE — Telephone Encounter (Signed)
Left message for pt to call back and make yearly follow up appt.

## 2016-01-01 NOTE — Telephone Encounter (Signed)
New message  Please call pt concerning whether he needs to make an annual f/u appt  No recall in Epic

## 2016-01-22 ENCOUNTER — Telehealth: Payer: Self-pay | Admitting: Pharmacist

## 2016-01-22 NOTE — Telephone Encounter (Signed)
Pt called to inquire if patient assistance is now available for Praluent. Informed him that we have not yet received an update from the company about patient assistance, but I would inquire to find out.   Spoke with rep and he stated that program tentatively rolling out Jan 29th. Notified patient of this and he states understanding and appreciation for follow up.

## 2016-01-29 DIAGNOSIS — H3522 Other non-diabetic proliferative retinopathy, left eye: Secondary | ICD-10-CM | POA: Diagnosis not present

## 2016-01-29 DIAGNOSIS — H35371 Puckering of macula, right eye: Secondary | ICD-10-CM | POA: Diagnosis not present

## 2016-01-29 DIAGNOSIS — H35372 Puckering of macula, left eye: Secondary | ICD-10-CM | POA: Diagnosis not present

## 2016-01-30 DIAGNOSIS — S4352XA Sprain of left acromioclavicular joint, initial encounter: Secondary | ICD-10-CM | POA: Diagnosis not present

## 2016-01-30 DIAGNOSIS — S63522A Sprain of radiocarpal joint of left wrist, initial encounter: Secondary | ICD-10-CM | POA: Diagnosis not present

## 2016-02-07 ENCOUNTER — Telehealth: Payer: Self-pay | Admitting: Interventional Cardiology

## 2016-02-07 NOTE — Telephone Encounter (Signed)
New Message   Pt stated he was told to give megan a call this week. Pt wants to stop by to pick up Alirocumab (PRALUENT) 150 MG/ML SOPN.

## 2016-02-07 NOTE — Telephone Encounter (Signed)
Pt picked up 2 Praluent samples. Awaiting Praluent patient assistance program to be rolled out within the next few weeks.

## 2016-02-12 DIAGNOSIS — D489 Neoplasm of uncertain behavior, unspecified: Secondary | ICD-10-CM | POA: Diagnosis not present

## 2016-02-12 DIAGNOSIS — L578 Other skin changes due to chronic exposure to nonionizing radiation: Secondary | ICD-10-CM | POA: Diagnosis not present

## 2016-02-12 DIAGNOSIS — L308 Other specified dermatitis: Secondary | ICD-10-CM | POA: Diagnosis not present

## 2016-02-12 DIAGNOSIS — L570 Actinic keratosis: Secondary | ICD-10-CM | POA: Diagnosis not present

## 2016-02-17 ENCOUNTER — Emergency Department (HOSPITAL_COMMUNITY)
Admission: EM | Admit: 2016-02-17 | Discharge: 2016-02-17 | Disposition: A | Discharge status: Home / Self Care | Payer: Medicare Other | Attending: Emergency Medicine | Admitting: Emergency Medicine

## 2016-02-17 ENCOUNTER — Emergency Department (HOSPITAL_COMMUNITY): Payer: Medicare Other

## 2016-02-17 ENCOUNTER — Other Ambulatory Visit: Payer: Self-pay

## 2016-02-17 ENCOUNTER — Telehealth: Payer: Self-pay | Admitting: Interventional Cardiology

## 2016-02-17 ENCOUNTER — Encounter (HOSPITAL_COMMUNITY): Payer: Self-pay | Admitting: Emergency Medicine

## 2016-02-17 DIAGNOSIS — Z955 Presence of coronary angioplasty implant and graft: Secondary | ICD-10-CM | POA: Insufficient documentation

## 2016-02-17 DIAGNOSIS — I10 Essential (primary) hypertension: Secondary | ICD-10-CM | POA: Diagnosis not present

## 2016-02-17 DIAGNOSIS — Z87891 Personal history of nicotine dependence: Secondary | ICD-10-CM | POA: Insufficient documentation

## 2016-02-17 DIAGNOSIS — R001 Bradycardia, unspecified: Secondary | ICD-10-CM | POA: Diagnosis not present

## 2016-02-17 DIAGNOSIS — I2510 Atherosclerotic heart disease of native coronary artery without angina pectoris: Secondary | ICD-10-CM | POA: Insufficient documentation

## 2016-02-17 DIAGNOSIS — Z79899 Other long term (current) drug therapy: Secondary | ICD-10-CM | POA: Diagnosis not present

## 2016-02-17 DIAGNOSIS — M545 Low back pain, unspecified: Secondary | ICD-10-CM

## 2016-02-17 DIAGNOSIS — I252 Old myocardial infarction: Secondary | ICD-10-CM | POA: Insufficient documentation

## 2016-02-17 DIAGNOSIS — R6889 Other general symptoms and signs: Secondary | ICD-10-CM | POA: Diagnosis not present

## 2016-02-17 DIAGNOSIS — M546 Pain in thoracic spine: Secondary | ICD-10-CM | POA: Diagnosis not present

## 2016-02-17 MED ORDER — CYCLOBENZAPRINE HCL 10 MG PO TABS
10.0000 mg | ORAL_TABLET | Freq: Once | ORAL | Status: AC
  Administered 2016-02-17: 10 mg via ORAL
  Filled 2016-02-17: qty 1

## 2016-02-17 MED ORDER — MORPHINE SULFATE (PF) 4 MG/ML IV SOLN
4.0000 mg | Freq: Once | INTRAVENOUS | Status: AC
  Administered 2016-02-17: 4 mg via INTRAVENOUS
  Filled 2016-02-17: qty 1

## 2016-02-17 MED ORDER — METHYLPREDNISOLONE 4 MG PO TBPK: ORAL_TABLET | ORAL | 0 refills | Status: DC

## 2016-02-17 MED ORDER — KETOROLAC TROMETHAMINE 15 MG/ML IJ SOLN
15.0000 mg | Freq: Once | INTRAMUSCULAR | Status: AC
  Administered 2016-02-17: 15 mg via INTRAVENOUS
  Filled 2016-02-17: qty 1

## 2016-02-17 MED ORDER — HYDROCODONE-ACETAMINOPHEN 5-325 MG PO TABS: 1.0000 | ORAL_TABLET | Freq: Four times a day (QID) | ORAL | 0 refills | Status: DC | PRN

## 2016-02-17 MED ORDER — HYDROCODONE-ACETAMINOPHEN 5-325 MG PO TABS
1.0000 | ORAL_TABLET | Freq: Once | ORAL | Status: AC
  Administered 2016-02-17: 1 via ORAL
  Filled 2016-02-17: qty 1

## 2016-02-17 MED ORDER — SODIUM CHLORIDE 0.9 % IV BOLUS (SEPSIS)
500.0000 mL | Freq: Once | INTRAVENOUS | Status: AC
  Administered 2016-02-17: 500 mL via INTRAVENOUS

## 2016-02-17 NOTE — ED Notes (Signed)
EDP made aware of pt BP. Pt is non symptomatic and states he is feeling a lot better.

## 2016-02-17 NOTE — Telephone Encounter (Signed)
Pt states he injured his back at L5 level, pt states he was in [facility] ED discharged today. Pt states he was advised to contact his PCP about recommendation for neurosurgeon.

## 2016-02-17 NOTE — ED Notes (Signed)
Pt reportedly vomitted a small amount. Requesting IV and IV medications.

## 2016-02-17 NOTE — ED Notes (Signed)
Patient transported to X-ray 

## 2016-02-17 NOTE — Telephone Encounter (Signed)
Note below copied from ED triage note early this morning:  Per EMS, 12L ECG showed pt in trigeminy and runs of PVCs.

## 2016-02-17 NOTE — Discharge Instructions (Signed)
You were seen today for back pain. Your x-rays are reassuring. There is no fracture. Will be discharged home with a short course of pain medication. This is likely related to your degenerative disc disease follow-up with her primary physician or Dr. Annette Stable if worsened.

## 2016-02-17 NOTE — Telephone Encounter (Signed)
New message      Calling to see which neurologist would Dr Tamala Julian recommend?  Pt is in need to see a neurologist but do not know any one.  Please call

## 2016-02-17 NOTE — Telephone Encounter (Signed)
Pt states he was told back pain may have contributed to irregularity on EKG. Pt states he was told EKG was okay while in ED. Pt wanted Dr Tamala Julian to have this information.  Pt is requesting recommendation from Dr Tamala Julian (pt states Dr Tamala Julian is his PCP) for neurosurgeon. Pt advised I will forward to Dr Tamala Julian for review.

## 2016-02-17 NOTE — ED Provider Notes (Signed)
[name] DEPT Provider Note   CSN: [id number] Arrival date & time: 02/17/16  0201  By signing my name below, I, Judithe Modest, attest that this documentation has been prepared under the direction and in the presence of Thayer Jew, MD. Electronically Signed: Judithe Modest, ER Scribe. 08/31/2015. 2:35 AM.  History   Chief Complaint Chief Complaint  Patient presents with  . Back Pain   The history is provided by the patient. No language interpreter was used.    HPI Comments: Jonathan Holden is a 72 y.o. male who presents to the Emergency Department complaining of gradually worsening lumnbar back pain since yesterday afternoon. He has a past surgical hx of back surgery at [facility]. His pain started while he was pushing a cart. He denies weakness, numbness, fever, abdominal pain, hematuria, loss of bowel or bladder control or abnormal BM. He has a PMHx of MI 20 years ago, but has passed a stress test every year since. His pain is 10/10 when he moves, 5/10 at baseline. He has taken ibuprofen without relief.    Past Medical History:  Diagnosis Date  . Arthritis HANDS AND RIGHT SHOULDER  . Coronary artery disease CARDIOLOGIST- DR Daneen Schick- LAST VISIT 6 MON AGO--  REQUESTED NOTE, EKG, ECHO, STRESS TEST   PT DENIES S & S  . H/O gastric ulcer   . History of MI (myocardial infarction) 1992  . Hyperlipidemia   . Hypertension   . Loose body of right shoulder   . S/P coronary angioplasty     Patient Active Problem List   Diagnosis Date Noted  . CAD (coronary artery disease), native coronary artery 09/19/2014  . Essential hypertension 09/19/2014  . Hyperlipidemia 10/03/2013    Past Surgical History:  Procedure Laterality Date  . CARDIOVASCULAR STRESS TEST  08-22-2008---  DR Daneen Schick   NO ISCHEMIA.   Marland Kitchen CORONARY ANGIOPLASTY  1992  . ERCP W/ SPHICTEROTOMY  12-15-2002   AND STONE EXTRACTION  . LAPAROSCOPIC CHOLECYSTECTOMY  12-16-2002  . LUMBAR LAMINECTOMY  20 YRS AGO   L5       Home Medications    Prior to Admission medications   Medication Sig Start Date End Date Taking? Authorizing Provider  Alirocumab (PRALUENT) 150 MG/ML SOPN Inject 150 mg into the skin every 14 (fourteen) days. 07/17/15   Belva Crome, MD  atorvastatin (LIPITOR) 80 MG tablet Take 40 mg by mouth every other day.    Historical Provider, MD  Coenzyme Q10 (COQ-10 PO) Take 1 capsule by mouth daily.     Historical Provider, MD  lisinopril-hydrochlorothiazide (PRINZIDE,ZESTORETIC) 20-12.5 MG tablet Take 1 tablet by mouth daily. Patient is overdue for an appointment. Please call and schedule for further refills 11/06/15   Belva Crome, MD  metoprolol succinate (TOPROL XL) 25 MG 24 hr tablet Take 1 tablet (25 mg total) by mouth daily. 06/08/14   Belva Crome, MD  Multiple Vitamin (MULTIVITAMIN) tablet Take 1 tablet by mouth daily.    Historical Provider, MD  triamcinolone cream (KENALOG) 0.1 % Apply 1 application topically 2 (two) times daily.  10/15/14   Historical Provider, MD    Family History Family History  Problem Relation Age of Onset  . Other Mother     AGE 40 HEALTHY  . Heart attack Father 24    2 MI  . Emphysema    . Other Brother     HEALTHY    Social History Social History  Substance Use Topics  .  Smoking status: Former Smoker    Packs/day: 2.00    Years: 20.00    Types: Cigarettes    Quit date: 03/27/1990  . Smokeless tobacco: Never Used  . Alcohol use 4.2 oz/week    7 Glasses of wine per week     Allergies   Ciprofloxacin   Review of Systems Review of Systems  Constitutional: Negative for chills and fever.  Respiratory: Negative for shortness of breath.   Cardiovascular: Negative for chest pain.  Gastrointestinal: Negative for abdominal pain, constipation, nausea and vomiting.  Musculoskeletal: Positive for back pain.  Neurological: Negative for weakness and numbness.     Physical Exam Updated Vital Signs BP 134/60 (BP Location: Left Arm)    Pulse (!) 33   Temp 98.2 F (36.8 C) (Oral)   Resp 15   Ht 6\' 2"  (1.88 m)   Wt 210 lb (95.3 kg)   SpO2 98%   BMI 26.96 kg/m   Physical Exam  Constitutional: He is oriented to person, place, and time. He appears well-developed and well-nourished.  HENT:  Head: Normocephalic and atraumatic.  Cardiovascular: Regular rhythm and normal heart sounds.   No murmur heard. bradycardia  Pulmonary/Chest: Effort normal and breath sounds normal. No respiratory distress. He has no wheezes.  Abdominal: Soft. Bowel sounds are normal. There is no tenderness. There is no rebound.  Musculoskeletal: He exhibits no edema.  TTP lower lumbar spine, no deformity noted  Neurological: He is alert and oriented to person, place, and time.  5/5 strength BLE, normal reflexes no clonus  Skin: Skin is warm and dry.  Psychiatric: He has a normal mood and affect.  Nursing note and vitals reviewed.    ED Treatments / Results  DIAGNOSTIC STUDIES: Oxygen Saturation is 98% on RA, normal by my interpretation.    COORDINATION OF CARE: 2:34 AM Discussed treatment plan with pt at bedside and pt agreed to plan.  Labs (all labs ordered are listed, but only abnormal results are displayed) Labs Reviewed - No data to display  EKG  EKG Interpretation None       Radiology No results found.  Procedures Procedures (including critical care time)  Medications Ordered in ED Medications  cyclobenzaprine (FLEXERIL) tablet 10 mg (not administered)  HYDROcodone-acetaminophen (NORCO/VICODIN) 5-325 MG per tablet 1 tablet (not administered)     Initial Impression / Assessment and Plan / ED Course  I have reviewed the triage vital signs and the nursing notes.  Pertinent labs & imaging results that were available during my care of the patient were reviewed by me and considered in my medical decision making (see chart for details).    Patient presents with back pain. Reports that he was pushing a cart yesterday  when he had progressively worsening back pain. Nontoxic on exam. No signs or symptoms of cauda equina.  No red flags. Neurologically intact. X-rays negative for fracture. Patient was given pain and nausea medication. Patient's blood pressure noted to drop into the 90s after pain medication. He is otherwise asymptomatic. He has been ambulatory and feels much better. Suspect musculoskeletal pain versus degenerative disc. Will place on a Medrol dose pack and a short course of pain medication. Follow-up with Dr. Annette Stable or primary physician.  After history, exam, and medical workup I feel the patient has been appropriately medically screened and is safe for discharge home. Pertinent diagnoses were discussed with the patient. Patient was given return precautions.   Final Clinical Impressions(s) / ED Diagnoses   Final diagnoses:  Acute midline low back pain without sciatica    New Prescriptions New Prescriptions   No medications on file    I personally performed the services described in this documentation, which was scribed in my presence. The recorded information has been reviewed and is accurate.        Merryl Hacker, MD 02/17/16 [phone]

## 2016-02-17 NOTE — ED Notes (Signed)
Pt was able to ambulate in the hall with assistance.

## 2016-02-17 NOTE — ED Notes (Signed)
Pt returned from X Ray.

## 2016-02-17 NOTE — ED Triage Notes (Addendum)
Pt coming from home by EMS. Was moving boxes using a cart yesterday (Saturday) and experienced sudden serious pain to his lower back area. Had surgery to his L5 region 24yrs ago for somewhat similar injury and states this feels the same. Pt unable to ambulate or move today, so called EMS. Pt on LSB, only for transportation purposes. Per EMS, 12L ECG showed pt in trigeminy and runs of PVCs. Pt has prior hx of MI (approx 1992).

## 2016-02-17 NOTE — Telephone Encounter (Signed)
Pt states EMS told him he had an irregular heart beat during transport to the ED.

## 2016-02-18 DIAGNOSIS — M545 Low back pain: Secondary | ICD-10-CM | POA: Diagnosis not present

## 2016-02-18 DIAGNOSIS — M4686 Other specified inflammatory spondylopathies, lumbar region: Secondary | ICD-10-CM | POA: Diagnosis not present

## 2016-02-18 NOTE — Telephone Encounter (Signed)
Left message to call back  

## 2016-02-18 NOTE — Telephone Encounter (Signed)
[place] or 2. Jovita Gamma or 3. Kristeen Miss

## 2016-02-20 DIAGNOSIS — M4686 Other specified inflammatory spondylopathies, lumbar region: Secondary | ICD-10-CM | POA: Diagnosis not present

## 2016-02-20 DIAGNOSIS — M545 Low back pain: Secondary | ICD-10-CM | POA: Diagnosis not present

## 2016-02-20 NOTE — Telephone Encounter (Signed)
Spoke with pt and made him aware of recommendations per Dr. Tamala Julian.  Pt would like to see Dr. Ellene Route as his name has been recommended by multiple people he has asked.  Pt would like to know if Dr. Tamala Julian would send do a referral for pt.  Advised this normally has to come from PCP and pt stated Dr. Tamala Julian was his PCP.  Advised him that Dr. Tamala Julian is his Cardiologist and insurance may not accept referral from a specialist.  Pt then stated that he needed to be seen today or tomorrow and that we needed to do what we could.  Advised pt to call Dr. Clarice Pole office as they may not require a referral and see about getting an appt.  Pt has appt with Dr. Abbey Chatters, ortho, today and advised him to ask him about referral as well.  Will route to Dr. Tamala Julian to see if ok to refer pt to Dr. Ellene Route.

## 2016-02-20 NOTE — Telephone Encounter (Signed)
His primary care should do this.

## 2016-02-21 ENCOUNTER — Emergency Department (HOSPITAL_COMMUNITY): Payer: Medicare Other

## 2016-02-21 ENCOUNTER — Telehealth: Payer: Self-pay | Admitting: Interventional Cardiology

## 2016-02-21 ENCOUNTER — Inpatient Hospital Stay (HOSPITAL_COMMUNITY)
Admission: EM | Admit: 2016-02-21 | Discharge: 2016-02-28 | DRG: 871 | Disposition: A | Discharge status: Rehabilitation Facility | Payer: Medicare Other | Attending: Internal Medicine | Admitting: Internal Medicine

## 2016-02-21 ENCOUNTER — Encounter (HOSPITAL_COMMUNITY): Payer: Self-pay | Admitting: Emergency Medicine

## 2016-02-21 ENCOUNTER — Inpatient Hospital Stay (HOSPITAL_COMMUNITY): Payer: Medicare Other

## 2016-02-21 DIAGNOSIS — E860 Dehydration: Secondary | ICD-10-CM | POA: Diagnosis present

## 2016-02-21 DIAGNOSIS — E871 Hypo-osmolality and hyponatremia: Secondary | ICD-10-CM | POA: Diagnosis not present

## 2016-02-21 DIAGNOSIS — G9340 Encephalopathy, unspecified: Secondary | ICD-10-CM | POA: Diagnosis present

## 2016-02-21 DIAGNOSIS — M19041 Primary osteoarthritis, right hand: Secondary | ICD-10-CM | POA: Diagnosis present

## 2016-02-21 DIAGNOSIS — Z825 Family history of asthma and other chronic lower respiratory diseases: Secondary | ICD-10-CM

## 2016-02-21 DIAGNOSIS — Z87891 Personal history of nicotine dependence: Secondary | ICD-10-CM | POA: Diagnosis not present

## 2016-02-21 DIAGNOSIS — E785 Hyperlipidemia, unspecified: Secondary | ICD-10-CM | POA: Diagnosis present

## 2016-02-21 DIAGNOSIS — I2510 Atherosclerotic heart disease of native coronary artery without angina pectoris: Secondary | ICD-10-CM | POA: Diagnosis not present

## 2016-02-21 DIAGNOSIS — E46 Unspecified protein-calorie malnutrition: Secondary | ICD-10-CM | POA: Diagnosis not present

## 2016-02-21 DIAGNOSIS — Z8249 Family history of ischemic heart disease and other diseases of the circulatory system: Secondary | ICD-10-CM

## 2016-02-21 DIAGNOSIS — K0889 Other specified disorders of teeth and supporting structures: Secondary | ICD-10-CM | POA: Diagnosis not present

## 2016-02-21 DIAGNOSIS — J069 Acute upper respiratory infection, unspecified: Secondary | ICD-10-CM | POA: Diagnosis not present

## 2016-02-21 DIAGNOSIS — B955 Unspecified streptococcus as the cause of diseases classified elsewhere: Secondary | ICD-10-CM

## 2016-02-21 DIAGNOSIS — Z8711 Personal history of peptic ulcer disease: Secondary | ICD-10-CM

## 2016-02-21 DIAGNOSIS — R7881 Bacteremia: Secondary | ICD-10-CM | POA: Diagnosis not present

## 2016-02-21 DIAGNOSIS — K5900 Constipation, unspecified: Secondary | ICD-10-CM | POA: Diagnosis present

## 2016-02-21 DIAGNOSIS — M4649 Discitis, unspecified, multiple sites in spine: Secondary | ICD-10-CM | POA: Diagnosis not present

## 2016-02-21 DIAGNOSIS — E876 Hypokalemia: Secondary | ICD-10-CM | POA: Diagnosis not present

## 2016-02-21 DIAGNOSIS — R509 Fever, unspecified: Secondary | ICD-10-CM

## 2016-02-21 DIAGNOSIS — M19042 Primary osteoarthritis, left hand: Secondary | ICD-10-CM | POA: Diagnosis present

## 2016-02-21 DIAGNOSIS — I471 Supraventricular tachycardia: Secondary | ICD-10-CM | POA: Diagnosis not present

## 2016-02-21 DIAGNOSIS — Z79899 Other long term (current) drug therapy: Secondary | ICD-10-CM

## 2016-02-21 DIAGNOSIS — R32 Unspecified urinary incontinence: Secondary | ICD-10-CM | POA: Diagnosis not present

## 2016-02-21 DIAGNOSIS — I25119 Atherosclerotic heart disease of native coronary artery with unspecified angina pectoris: Secondary | ICD-10-CM | POA: Diagnosis present

## 2016-02-21 DIAGNOSIS — M549 Dorsalgia, unspecified: Secondary | ICD-10-CM | POA: Diagnosis present

## 2016-02-21 DIAGNOSIS — Z881 Allergy status to other antibiotic agents status: Secondary | ICD-10-CM | POA: Diagnosis not present

## 2016-02-21 DIAGNOSIS — G541 Lumbosacral plexus disorders: Secondary | ICD-10-CM | POA: Diagnosis not present

## 2016-02-21 DIAGNOSIS — D62 Acute posthemorrhagic anemia: Secondary | ICD-10-CM | POA: Diagnosis present

## 2016-02-21 DIAGNOSIS — I272 Pulmonary hypertension, unspecified: Secondary | ICD-10-CM | POA: Diagnosis present

## 2016-02-21 DIAGNOSIS — I10 Essential (primary) hypertension: Secondary | ICD-10-CM | POA: Diagnosis present

## 2016-02-21 DIAGNOSIS — K567 Ileus, unspecified: Secondary | ICD-10-CM

## 2016-02-21 DIAGNOSIS — M4646 Discitis, unspecified, lumbar region: Secondary | ICD-10-CM

## 2016-02-21 DIAGNOSIS — M545 Low back pain, unspecified: Secondary | ICD-10-CM

## 2016-02-21 DIAGNOSIS — M4647 Discitis, unspecified, lumbosacral region: Secondary | ICD-10-CM | POA: Diagnosis not present

## 2016-02-21 DIAGNOSIS — I480 Paroxysmal atrial fibrillation: Secondary | ICD-10-CM | POA: Diagnosis not present

## 2016-02-21 DIAGNOSIS — Z955 Presence of coronary angioplasty implant and graft: Secondary | ICD-10-CM

## 2016-02-21 DIAGNOSIS — A419 Sepsis, unspecified organism: Secondary | ICD-10-CM | POA: Diagnosis not present

## 2016-02-21 DIAGNOSIS — I252 Old myocardial infarction: Secondary | ICD-10-CM

## 2016-02-21 DIAGNOSIS — G8220 Paraplegia, unspecified: Secondary | ICD-10-CM | POA: Diagnosis not present

## 2016-02-21 DIAGNOSIS — A409 Streptococcal sepsis, unspecified: Principal | ICD-10-CM | POA: Diagnosis present

## 2016-02-21 DIAGNOSIS — I4891 Unspecified atrial fibrillation: Secondary | ICD-10-CM

## 2016-02-21 DIAGNOSIS — Z452 Encounter for adjustment and management of vascular access device: Secondary | ICD-10-CM | POA: Diagnosis not present

## 2016-02-21 DIAGNOSIS — I361 Nonrheumatic tricuspid (valve) insufficiency: Secondary | ICD-10-CM | POA: Diagnosis not present

## 2016-02-21 DIAGNOSIS — I493 Ventricular premature depolarization: Secondary | ICD-10-CM | POA: Diagnosis present

## 2016-02-21 DIAGNOSIS — B001 Herpesviral vesicular dermatitis: Secondary | ICD-10-CM | POA: Diagnosis present

## 2016-02-21 DIAGNOSIS — F09 Unspecified mental disorder due to known physiological condition: Secondary | ICD-10-CM | POA: Diagnosis not present

## 2016-02-21 DIAGNOSIS — E8809 Other disorders of plasma-protein metabolism, not elsewhere classified: Secondary | ICD-10-CM | POA: Diagnosis present

## 2016-02-21 DIAGNOSIS — R319 Hematuria, unspecified: Secondary | ICD-10-CM | POA: Diagnosis not present

## 2016-02-21 DIAGNOSIS — Z91018 Allergy to other foods: Secondary | ICD-10-CM | POA: Diagnosis not present

## 2016-02-21 DIAGNOSIS — R140 Abdominal distension (gaseous): Secondary | ICD-10-CM | POA: Diagnosis not present

## 2016-02-21 DIAGNOSIS — K219 Gastro-esophageal reflux disease without esophagitis: Secondary | ICD-10-CM | POA: Diagnosis present

## 2016-02-21 DIAGNOSIS — M5126 Other intervertebral disc displacement, lumbar region: Secondary | ICD-10-CM | POA: Diagnosis not present

## 2016-02-21 DIAGNOSIS — R000 Tachycardia, unspecified: Secondary | ICD-10-CM | POA: Diagnosis not present

## 2016-02-21 DIAGNOSIS — I38 Endocarditis, valve unspecified: Secondary | ICD-10-CM

## 2016-02-21 DIAGNOSIS — M5440 Lumbago with sciatica, unspecified side: Secondary | ICD-10-CM | POA: Diagnosis not present

## 2016-02-21 DIAGNOSIS — T402X5A Adverse effect of other opioids, initial encounter: Secondary | ICD-10-CM | POA: Diagnosis present

## 2016-02-21 DIAGNOSIS — M4630 Infection of intervertebral disc (pyogenic), site unspecified: Secondary | ICD-10-CM | POA: Diagnosis not present

## 2016-02-21 DIAGNOSIS — R030 Elevated blood-pressure reading, without diagnosis of hypertension: Secondary | ICD-10-CM | POA: Diagnosis not present

## 2016-02-21 DIAGNOSIS — E784 Other hyperlipidemia: Secondary | ICD-10-CM | POA: Diagnosis not present

## 2016-02-21 DIAGNOSIS — R4189 Other symptoms and signs involving cognitive functions and awareness: Secondary | ICD-10-CM | POA: Diagnosis present

## 2016-02-21 DIAGNOSIS — Z981 Arthrodesis status: Secondary | ICD-10-CM | POA: Diagnosis not present

## 2016-02-21 DIAGNOSIS — Z9049 Acquired absence of other specified parts of digestive tract: Secondary | ICD-10-CM

## 2016-02-21 DIAGNOSIS — M4696 Unspecified inflammatory spondylopathy, lumbar region: Secondary | ICD-10-CM | POA: Diagnosis not present

## 2016-02-21 HISTORY — DX: Unspecified atrial fibrillation: I48.91

## 2016-02-21 LAB — CBC WITH DIFFERENTIAL/PLATELET
Basophils Absolute: 0 K/uL (ref 0.0–0.1)
Basophils Absolute: 0 10*3/uL (ref 0.0–0.1)
Basophils Relative: 0 %
Basophils Relative: 0 %
Eosinophils Absolute: 0 K/uL (ref 0.0–0.7)
Eosinophils Absolute: 0 10*3/uL (ref 0.0–0.7)
Eosinophils Relative: 0 %
Eosinophils Relative: 0 %
HCT: 38.9 % — ABNORMAL LOW (ref 39.0–52.0)
HEMATOCRIT: 35.2 % — AB (ref 39.0–52.0)
HEMOGLOBIN: 12.2 g/dL — AB (ref 13.0–17.0)
Hemoglobin: 13.7 g/dL (ref 13.0–17.0)
LYMPHS ABS: 1.3 10*3/uL (ref 0.7–4.0)
LYMPHS PCT: 14 %
Lymphocytes Relative: 10 %
Lymphs Abs: 1.1 K/uL (ref 0.7–4.0)
MCH: 32.2 pg (ref 26.0–34.0)
MCH: 32.1 pg (ref 26.0–34.0)
MCHC: 35.2 g/dL (ref 30.0–36.0)
MCHC: 34.7 g/dL (ref 30.0–36.0)
MCV: 91.5 fL (ref 78.0–100.0)
MCV: 92.6 fL (ref 78.0–100.0)
MONOS PCT: 8 %
Monocytes Absolute: 1.3 K/uL — ABNORMAL HIGH (ref 0.1–1.0)
Monocytes Absolute: 0.8 10*3/uL (ref 0.1–1.0)
Monocytes Relative: 11 %
NEUTROS ABS: 7.5 10*3/uL (ref 1.7–7.7)
NEUTROS PCT: 78 %
Neutro Abs: 8.6 K/uL — ABNORMAL HIGH (ref 1.7–7.7)
Neutrophils Relative %: 79 %
Platelets: 136 K/uL — ABNORMAL LOW (ref 150–400)
Platelets: 125 10*3/uL — ABNORMAL LOW (ref 150–400)
RBC: 4.25 MIL/uL (ref 4.22–5.81)
RBC: 3.8 MIL/uL — ABNORMAL LOW (ref 4.22–5.81)
RDW: 13.9 % (ref 11.5–15.5)
RDW: 14.2 % (ref 11.5–15.5)
WBC: 11 K/uL — ABNORMAL HIGH (ref 4.0–10.5)
WBC: 9.7 10*3/uL (ref 4.0–10.5)

## 2016-02-21 LAB — INFLUENZA PANEL BY PCR (TYPE A & B)
Influenza A By PCR: NEGATIVE
Influenza B By PCR: NEGATIVE

## 2016-02-21 LAB — BASIC METABOLIC PANEL
ANION GAP: 12 (ref 5–15)
Anion gap: 10 (ref 5–15)
BUN: 19 mg/dL (ref 6–20)
BUN: 20 mg/dL (ref 6–20)
CALCIUM: 8.3 mg/dL — AB (ref 8.9–10.3)
CHLORIDE: 103 mmol/L (ref 101–111)
CO2: 21 mmol/L — AB (ref 22–32)
CO2: 20 mmol/L — AB (ref 22–32)
Calcium: 8.5 mg/dL — ABNORMAL LOW (ref 8.9–10.3)
Chloride: 102 mmol/L (ref 101–111)
Creatinine, Ser: 0.87 mg/dL (ref 0.61–1.24)
Creatinine, Ser: 0.9 mg/dL (ref 0.61–1.24)
GFR calc non Af Amer: >60 mL/min (ref >60)
GFR calc non Af Amer: >60 mL/min (ref >60)
GLUCOSE: 101 mg/dL — AB (ref 65–99)
Glucose, Bld: 113 mg/dL — ABNORMAL HIGH (ref 65–99)
POTASSIUM: 3.9 mmol/L (ref 3.5–5.1)
POTASSIUM: 3.8 mmol/L (ref 3.5–5.1)
Sodium: 134 mmol/L — ABNORMAL LOW (ref 135–145)
Sodium: 134 mmol/L — ABNORMAL LOW (ref 135–145)

## 2016-02-21 LAB — I-STAT CHEM 8, ED
BUN: 26 mg/dL — ABNORMAL HIGH (ref 6–20)
Calcium, Ion: 1.15 mmol/L (ref 1.15–1.40)
Chloride: 98 mmol/L — ABNORMAL LOW (ref 101–111)
Creatinine, Ser: 0.8 mg/dL (ref 0.61–1.24)
Glucose, Bld: 121 mg/dL — ABNORMAL HIGH (ref 65–99)
HEMATOCRIT: 39 % (ref 39.0–52.0)
HEMOGLOBIN: 13.3 g/dL (ref 13.0–17.0)
POTASSIUM: 3.9 mmol/L (ref 3.5–5.1)
SODIUM: 131 mmol/L — AB (ref 135–145)
TCO2: 23 mmol/L (ref 0–100)

## 2016-02-21 LAB — URINALYSIS, ROUTINE W REFLEX MICROSCOPIC
Bacteria, UA: NONE SEEN
Bilirubin Urine: NEGATIVE
Glucose, UA: NEGATIVE mg/dL
Ketones, ur: NEGATIVE mg/dL
Leukocytes, UA: NEGATIVE
Nitrite: NEGATIVE
Protein, ur: 30 mg/dL — AB
Specific Gravity, Urine: 1.02 (ref 1.005–1.030)
Squamous Epithelial / HPF: NONE SEEN
pH: 5 (ref 5.0–8.0)

## 2016-02-21 LAB — BLOOD GAS, ARTERIAL
Acid-base deficit: 1.9 mmol/L (ref 0.0–2.0)
Bicarbonate: 21.8 mmol/L (ref 20.0–28.0)
Drawn by: 330991
O2 CONTENT: 2 L/min
O2 SAT: 94.3 %
PCO2 ART: 33.5 mmHg (ref 32.0–48.0)
PH ART: 7.43 (ref 7.350–7.450)
Patient temperature: 98.6
pO2, Arterial: 73.7 mmHg — ABNORMAL LOW (ref 83.0–108.0)

## 2016-02-21 LAB — COMPREHENSIVE METABOLIC PANEL
ALK PHOS: 58 U/L (ref 38–126)
ALT: 26 U/L (ref 17–63)
ANION GAP: 14 (ref 5–15)
AST: 30 U/L (ref 15–41)
Albumin: 3 g/dL — ABNORMAL LOW (ref 3.5–5.0)
BILIRUBIN TOTAL: 1 mg/dL (ref 0.3–1.2)
BUN: 25 mg/dL — ABNORMAL HIGH (ref 6–20)
CALCIUM: 9.7 mg/dL (ref 8.9–10.3)
CO2: 21 mmol/L — ABNORMAL LOW (ref 22–32)
Chloride: 94 mmol/L — ABNORMAL LOW (ref 101–111)
Creatinine, Ser: 0.93 mg/dL (ref 0.61–1.24)
GFR calc non Af Amer: >60 mL/min (ref >60)
GLUCOSE: 115 mg/dL — AB (ref 65–99)
Potassium: 3.9 mmol/L (ref 3.5–5.1)
Sodium: 129 mmol/L — ABNORMAL LOW (ref 135–145)
Total Protein: 6.4 g/dL — ABNORMAL LOW (ref 6.5–8.1)

## 2016-02-21 LAB — RAPID URINE DRUG SCREEN, HOSP PERFORMED
Amphetamines: NOT DETECTED
BENZODIAZEPINES: NOT DETECTED
Barbiturates: NOT DETECTED
COCAINE: NOT DETECTED
OPIATES: NOT DETECTED
Tetrahydrocannabinol: NOT DETECTED

## 2016-02-21 LAB — PROTIME-INR
INR: 1.26
PROTHROMBIN TIME: 15.8 s — AB (ref 11.4–15.2)

## 2016-02-21 LAB — TSH: TSH: 1.159 u[IU]/mL (ref 0.350–4.500)

## 2016-02-21 LAB — I-STAT CG4 LACTIC ACID, ED
LACTIC ACID, VENOUS: 1.71 mmol/L (ref 0.5–1.9)
Lactic Acid, Venous: 1.11 mmol/L (ref 0.5–1.9)

## 2016-02-21 LAB — LIPASE, BLOOD: Lipase: 16 U/L (ref 11–51)

## 2016-02-21 LAB — TROPONIN I
TROPONIN I: 0.06 ng/mL — AB (ref <0.03)
Troponin I: 0.03 ng/mL (ref <0.03)

## 2016-02-21 LAB — PHOSPHORUS: PHOSPHORUS: 3 mg/dL (ref 2.5–4.6)

## 2016-02-21 LAB — LACTIC ACID, PLASMA: Lactic Acid, Venous: 0.7 mmol/L (ref 0.5–1.9)

## 2016-02-21 LAB — ECHOCARDIOGRAM COMPLETE
Height: 74 in
Weight: 3360 oz

## 2016-02-21 LAB — ETHANOL: Alcohol, Ethyl (B): <5 mg/dL

## 2016-02-21 LAB — MRSA PCR SCREENING: MRSA by PCR: NEGATIVE

## 2016-02-21 LAB — MAGNESIUM: MAGNESIUM: 1.8 mg/dL (ref 1.7–2.4)

## 2016-02-21 MED ORDER — HYDROMORPHONE HCL 2 MG/ML IJ SOLN
0.5000 mg | Freq: Once | INTRAMUSCULAR | Status: AC
  Administered 2016-02-21: 0.5 mg via INTRAVENOUS
  Filled 2016-02-21: qty 1

## 2016-02-21 MED ORDER — LISINOPRIL-HYDROCHLOROTHIAZIDE 20-12.5 MG PO TABS: 1.0000 | ORAL_TABLET | Freq: Every day | ORAL | Status: DC

## 2016-02-21 MED ORDER — METOPROLOL TARTRATE 5 MG/5ML IV SOLN: 5.0000 mg | Freq: Three times a day (TID) | INTRAVENOUS | Status: DC | PRN

## 2016-02-21 MED ORDER — ADENOSINE 6 MG/2ML IV SOLN
6.0000 mg | Freq: Once | INTRAVENOUS | Status: AC
  Administered 2016-02-21: 6 mg via INTRAVENOUS
  Filled 2016-02-21: qty 2

## 2016-02-21 MED ORDER — LORAZEPAM 2 MG/ML IJ SOLN
2.0000 mg | INTRAMUSCULAR | Status: DC | PRN
  Administered 2016-02-21 (×2): 3 mg via INTRAVENOUS
  Administered 2016-02-22: 2 mg via INTRAVENOUS
  Filled 2016-02-21: qty 1
  Filled 2016-02-21 (×2): qty 2
  Filled 2016-02-21: qty 1

## 2016-02-21 MED ORDER — HALOPERIDOL LACTATE 5 MG/ML IJ SOLN
5.0000 mg | Freq: Once | INTRAMUSCULAR | Status: AC
  Administered 2016-02-21: 5 mg via INTRAVENOUS
  Filled 2016-02-21: qty 1

## 2016-02-21 MED ORDER — ACETAMINOPHEN 325 MG PO TABS
650.0000 mg | ORAL_TABLET | Freq: Once | ORAL | Status: AC
  Administered 2016-02-21: 650 mg via ORAL
  Filled 2016-02-21: qty 2

## 2016-02-21 MED ORDER — FOLIC ACID 1 MG PO TABS
1.0000 mg | ORAL_TABLET | Freq: Every day | ORAL | Status: DC
  Administered 2016-02-22 – 2016-02-28 (×6): 1 mg via ORAL
  Filled 2016-02-21 (×6): qty 1

## 2016-02-21 MED ORDER — ACETAMINOPHEN 325 MG PO TABS
650.0000 mg | ORAL_TABLET | Freq: Four times a day (QID) | ORAL | Status: DC | PRN
  Administered 2016-02-26 (×2): 650 mg via ORAL
  Filled 2016-02-21 (×2): qty 2

## 2016-02-21 MED ORDER — ONDANSETRON HCL 4 MG PO TABS: 4.0000 mg | ORAL_TABLET | Freq: Four times a day (QID) | ORAL | Status: DC | PRN

## 2016-02-21 MED ORDER — LIDOCAINE HCL 2 % EX GEL
1.0000 "application " | Freq: Once | CUTANEOUS | Status: AC
  Administered 2016-02-21: 1 via TOPICAL
  Filled 2016-02-21: qty 20

## 2016-02-21 MED ORDER — HYDROCHLOROTHIAZIDE 12.5 MG PO CAPS
12.5000 mg | ORAL_CAPSULE | Freq: Every day | ORAL | Status: DC
  Administered 2016-02-22: 12.5 mg via ORAL
  Filled 2016-02-21: qty 1

## 2016-02-21 MED ORDER — ONDANSETRON HCL 4 MG/2ML IJ SOLN
4.0000 mg | Freq: Four times a day (QID) | INTRAMUSCULAR | Status: DC | PRN
  Administered 2016-02-25: 4 mg via INTRAVENOUS
  Filled 2016-02-21: qty 2

## 2016-02-21 MED ORDER — LORAZEPAM 2 MG/ML IJ SOLN
1.0000 mg | Freq: Once | INTRAMUSCULAR | Status: AC
  Administered 2016-02-21: 1 mg via INTRAVENOUS

## 2016-02-21 MED ORDER — DOCUSATE SODIUM 50 MG/5ML PO LIQD
100.0000 mg | Freq: Two times a day (BID) | ORAL | Status: DC
  Administered 2016-02-22: 100 mg via ORAL
  Filled 2016-02-21: qty 10

## 2016-02-21 MED ORDER — VANCOMYCIN HCL IN DEXTROSE 1-5 GM/200ML-% IV SOLN
1000.0000 mg | Freq: Once | INTRAVENOUS | Status: AC
  Administered 2016-02-21: 1000 mg via INTRAVENOUS
  Filled 2016-02-21: qty 200

## 2016-02-21 MED ORDER — METOPROLOL TARTRATE 5 MG/5ML IV SOLN
2.5000 mg | Freq: Once | INTRAVENOUS | Status: AC
  Administered 2016-02-21: 2.5 mg via INTRAVENOUS
  Filled 2016-02-21: qty 5

## 2016-02-21 MED ORDER — LIDOCAINE HCL (PF) 1 % IJ SOLN: 5.0000 mL | Freq: Once | INTRAMUSCULAR | Status: DC

## 2016-02-21 MED ORDER — HYDRALAZINE HCL 20 MG/ML IJ SOLN
10.0000 mg | Freq: Three times a day (TID) | INTRAMUSCULAR | Status: DC | PRN
  Administered 2016-02-26 (×2): 10 mg via INTRAVENOUS
  Filled 2016-02-21 (×2): qty 1

## 2016-02-21 MED ORDER — LORAZEPAM 2 MG/ML IJ SOLN
INTRAMUSCULAR | Status: AC
  Filled 2016-02-21: qty 1

## 2016-02-21 MED ORDER — VANCOMYCIN HCL IN DEXTROSE 1-5 GM/200ML-% IV SOLN
1000.0000 mg | Freq: Three times a day (TID) | INTRAVENOUS | Status: DC
  Administered 2016-02-21 – 2016-02-22 (×3): 1000 mg via INTRAVENOUS
  Filled 2016-02-21 (×4): qty 200

## 2016-02-21 MED ORDER — ADULT MULTIVITAMIN W/MINERALS CH
1.0000 | ORAL_TABLET | Freq: Every day | ORAL | Status: DC
  Administered 2016-02-22 – 2016-02-28 (×6): 1 via ORAL
  Filled 2016-02-21 (×6): qty 1

## 2016-02-21 MED ORDER — PIPERACILLIN-TAZOBACTAM 3.375 G IVPB 30 MIN
3.3750 g | Freq: Once | INTRAVENOUS | Status: AC
  Administered 2016-02-21: 3.375 g via INTRAVENOUS
  Filled 2016-02-21: qty 50

## 2016-02-21 MED ORDER — OSELTAMIVIR PHOSPHATE 75 MG PO CAPS
75.0000 mg | ORAL_CAPSULE | Freq: Once | ORAL | Status: AC
  Administered 2016-02-21: 75 mg via ORAL
  Filled 2016-02-21: qty 1

## 2016-02-21 MED ORDER — HALOPERIDOL LACTATE 5 MG/ML IJ SOLN
5.0000 mg | Freq: Three times a day (TID) | INTRAMUSCULAR | Status: DC | PRN
  Filled 2016-02-21: qty 1

## 2016-02-21 MED ORDER — SODIUM CHLORIDE 0.9 % IV BOLUS (SEPSIS)
1000.0000 mL | Freq: Once | INTRAVENOUS | Status: AC
  Administered 2016-02-21: 1000 mL via INTRAVENOUS

## 2016-02-21 MED ORDER — OSELTAMIVIR PHOSPHATE 75 MG PO CAPS
75.0000 mg | ORAL_CAPSULE | Freq: Two times a day (BID) | ORAL | Status: DC
  Administered 2016-02-22: 75 mg via ORAL
  Filled 2016-02-21 (×2): qty 1

## 2016-02-21 MED ORDER — VITAMIN B-1 100 MG PO TABS
100.0000 mg | ORAL_TABLET | Freq: Every day | ORAL | Status: DC
  Administered 2016-02-22 – 2016-02-28 (×6): 100 mg via ORAL
  Filled 2016-02-21 (×7): qty 1

## 2016-02-21 MED ORDER — ACETAMINOPHEN 650 MG RE SUPP
650.0000 mg | Freq: Four times a day (QID) | RECTAL | Status: DC | PRN
Start: 2016-02-21 — End: 2016-02-28
  Administered 2016-02-21 – 2016-02-22 (×2): 650 mg via RECTAL
  Filled 2016-02-21 (×2): qty 1

## 2016-02-21 MED ORDER — POLYETHYLENE GLYCOL 3350 17 G PO PACK: 17.0000 g | PACK | Freq: Every day | ORAL | Status: DC | PRN

## 2016-02-21 MED ORDER — SODIUM CHLORIDE 0.9 % IV SOLN
INTRAVENOUS | Status: DC
  Administered 2016-02-21: 14:00:00 via INTRAVENOUS

## 2016-02-21 MED ORDER — GLYCERIN (LAXATIVE) 2.1 G RE SUPP
1.0000 | RECTAL | Status: AC
  Administered 2016-02-21: 1 via RECTAL
  Filled 2016-02-21: qty 1

## 2016-02-21 MED ORDER — KETOROLAC TROMETHAMINE 15 MG/ML IJ SOLN
15.0000 mg | Freq: Three times a day (TID) | INTRAMUSCULAR | Status: DC | PRN
  Administered 2016-02-22 – 2016-02-26 (×7): 15 mg via INTRAVENOUS
  Filled 2016-02-21 (×7): qty 1

## 2016-02-21 MED ORDER — METOPROLOL TARTRATE 5 MG/5ML IV SOLN
5.0000 mg | Freq: Three times a day (TID) | INTRAVENOUS | Status: DC | PRN
  Administered 2016-02-21 – 2016-02-26 (×3): 5 mg via INTRAVENOUS
  Filled 2016-02-21 (×3): qty 5

## 2016-02-21 MED ORDER — PIPERACILLIN-TAZOBACTAM 3.375 G IVPB
3.3750 g | Freq: Three times a day (TID) | INTRAVENOUS | Status: DC
  Administered 2016-02-21 – 2016-02-22 (×3): 3.375 g via INTRAVENOUS
  Filled 2016-02-21 (×4): qty 50

## 2016-02-21 MED ORDER — LISINOPRIL 20 MG PO TABS
20.0000 mg | ORAL_TABLET | Freq: Every day | ORAL | Status: DC
  Administered 2016-02-22 – 2016-02-28 (×6): 20 mg via ORAL
  Filled 2016-02-21 (×6): qty 1

## 2016-02-21 MED ORDER — LIDOCAINE 5 % EX PTCH
2.0000 | MEDICATED_PATCH | CUTANEOUS | Status: DC
  Administered 2016-02-21 – 2016-02-27 (×7): 2 via TRANSDERMAL
  Filled 2016-02-21 (×2): qty 2
  Filled 2016-02-21: qty 1
  Filled 2016-02-21 (×7): qty 2

## 2016-02-21 MED ORDER — SODIUM CHLORIDE 0.9 % IV BOLUS (SEPSIS)
500.0000 mL | Freq: Once | INTRAVENOUS | Status: AC
  Administered 2016-02-21: 500 mL via INTRAVENOUS

## 2016-02-21 MED ORDER — DEXTROSE 5 % IV SOLN
5.0000 mg/h | INTRAVENOUS | Status: DC
  Administered 2016-02-21: 5 mg/h via INTRAVENOUS
  Administered 2016-02-22: 12.5 mg/h via INTRAVENOUS
  Administered 2016-02-22 – 2016-02-23 (×2): 15 mg/h via INTRAVENOUS
  Administered 2016-02-24: 5 mg/h via INTRAVENOUS
  Administered 2016-02-24: 10 mg/h via INTRAVENOUS
  Administered 2016-02-24: 15 mg/h via INTRAVENOUS
  Filled 2016-02-21 (×11): qty 100

## 2016-02-21 MED ORDER — LORAZEPAM 2 MG/ML IJ SOLN
1.0000 mg | Freq: Once | INTRAMUSCULAR | Status: AC
  Administered 2016-02-21: 1 mg via INTRAVENOUS
  Filled 2016-02-21: qty 1

## 2016-02-21 MED ORDER — DILTIAZEM HCL 100 MG IV SOLR
5.0000 mg/h | INTRAVENOUS | Status: DC
  Administered 2016-02-21: 5 mg/h via INTRAVENOUS
  Filled 2016-02-21 (×2): qty 100

## 2016-02-21 MED ORDER — ATORVASTATIN CALCIUM 40 MG PO TABS
40.0000 mg | ORAL_TABLET | Freq: Every day | ORAL | Status: DC
  Administered 2016-02-22 – 2016-02-28 (×6): 40 mg via ORAL
  Filled 2016-02-21 (×6): qty 1

## 2016-02-21 MED ORDER — LORAZEPAM 2 MG/ML IJ SOLN
INTRAMUSCULAR | Status: AC
  Administered 2016-02-21: 1 mg
  Filled 2016-02-21: qty 1

## 2016-02-21 MED ORDER — DILTIAZEM LOAD VIA INFUSION
15.0000 mg | Freq: Once | INTRAVENOUS | Status: AC
  Administered 2016-02-21: 15 mg via INTRAVENOUS
  Filled 2016-02-21: qty 15

## 2016-02-21 NOTE — ED Notes (Signed)
Critical Troponin 0.06.

## 2016-02-21 NOTE — Telephone Encounter (Signed)
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

## 2016-02-21 NOTE — ED Notes (Signed)
Pt O2 sats 94% RA. Martin dc'ed at this time.

## 2016-02-21 NOTE — Progress Notes (Signed)
Please place orders in EPIC as patient is being scheduled for a pre-op appointment! Thank you! 

## 2016-02-21 NOTE — ED Notes (Signed)
Echo at the bedside °

## 2016-02-21 NOTE — ED Notes (Signed)
[place] PA speaking to [place] (pt friend) via telephone. She was the one who called EMS.

## 2016-02-21 NOTE — ED Notes (Signed)
ED Provider at bedside. 

## 2016-02-21 NOTE — ED Triage Notes (Signed)
BIB EMS from home, chronic lower back pain exacerbated approx 4 days ago d/t pushing shopping cart, was seen here already when injury occurred. Pt given 17mcg fentanyl en route then pt became "altered" so EMS gave 0.5 narcan. ETOH also on board. Vicodin & Steroids were prescribed when pt was here other day. Pt reports he Is unable to walk.

## 2016-02-21 NOTE — ED Notes (Signed)
Pt still noted to be in SVT, preparing to give another 12mg  Adenosine.

## 2016-02-21 NOTE — ED Provider Notes (Signed)
[facility] DEPT Provider Note   CSN: [id number] Arrival date & time: 02/21/16  0545     History   Chief Complaint Chief Complaint  Patient presents with  . Back Pain    HPI   Blood pressure 141/91, pulse (!) 127, temperature 101.3 F (38.5 C), temperature source Oral, resp. rate 19, height 6\' 2"  (1.88 m), weight 95.3 kg, SpO2 94 %.  Jonathan Holden is a 71 y.o. male brought in by EMS for evaluation of back pain. Patient confused and mildly disoriented, confabulating. Level V caveat. He states that he saw doctor  this morning (it is 6 AM) but he can't remember their name. As per EMS there was several cans of beer at the house. He reports that he had a laminectomy 20 years ago, no recent surgeries. Llens replacement in the last 12 months to 9 months respectively. It's that he had an episode of severe dysuria several days ago. He denies flank pain. He states at the level of his back pain is severe and atypical for him. He's had no incontinence or difficulty ambulating or saddle anesthesia. No history of CHF or COPD but he states that he sees cardiology yearly for stress test. He is told me that Cloyde Reams is a caregiver that lives with him and that Cloyde Reams states that he has been confused however as per nurse he states that Cloyde Reams was his wife. He denies any cervicalgia, change in vision, cough, fever, chills, sore throat, rhinorrhea, sick contacts, chest pain, shortness of breath, abdominal pain, nausea, vomiting. Has not had bowel movement in 4 days. No incontinence.  His friend Cloyde Reams also adds that there are some visual hallucinations he states that the dog ate his food in the dog was on the ceiling last night. He's been relatively confused and hallucinating after starting on the narcotic pain medication on Monday.  Past Medical History:  Diagnosis Date  . Arthritis HANDS AND RIGHT SHOULDER  . Coronary artery disease CARDIOLOGIST- DR Daneen Schick- LAST VISIT 6 MON AGO--  REQUESTED NOTE, EKG,  ECHO, STRESS TEST   PT DENIES S & S  . H/O gastric ulcer   . History of MI (myocardial infarction) 1992  . Hyperlipidemia   . Hypertension   . Loose body of right shoulder   . S/P coronary angioplasty     Patient Active Problem List   Diagnosis Date Noted  . Sepsis ([place]) 02/21/2016  . Hyponatremia 02/21/2016  . Atrial fibrillation with RVR ([place]) 02/21/2016  . Back pain 02/21/2016  . Acute encephalopathy 02/21/2016  . CAD (coronary artery disease), native coronary artery 09/19/2014  . Essential hypertension 09/19/2014  . Hyperlipidemia 10/03/2013    Past Surgical History:  Procedure Laterality Date  . CARDIOVASCULAR STRESS TEST  08-22-2008---  DR Daneen Schick   NO ISCHEMIA.   Marland Kitchen CORONARY ANGIOPLASTY  1992  . ERCP W/ SPHICTEROTOMY  12-15-2002   AND STONE EXTRACTION  . LAPAROSCOPIC CHOLECYSTECTOMY  12-16-2002  . LUMBAR LAMINECTOMY  20 YRS AGO   L5       Home Medications    Prior to Admission medications   Medication Sig Start Date End Date Taking? Authorizing Provider  Alirocumab (PRALUENT) 150 MG/ML SOPN Inject 150 mg into the skin every 14 (fourteen) days. 07/17/15  Yes Belva Crome, MD  atorvastatin (LIPITOR) 80 MG tablet Take 40 mg by mouth daily.   Yes Historical Provider, MD  Coenzyme Q10 (COQ10 PO) Take 1 capsule by mouth daily.   Yes Historical Provider,  MD  cyclobenzaprine (FLEXERIL) 10 MG tablet Take 10 mg by mouth 2 (two) times daily.  02/18/16  Yes Historical Provider, MD  GLUCOSAMINE-CHONDROITIN PO Take 2 tablets by mouth daily.   Yes Historical Provider, MD  lisinopril-hydrochlorothiazide (PRINZIDE,ZESTORETIC) 20-12.5 MG tablet Take 1 tablet by mouth daily. Patient is overdue for an appointment. Please call and schedule for further refills 11/06/15  Yes Belva Crome, MD  Melatonin 10 MG TABS Take 10 mg by mouth at bedtime as needed (sleep).   Yes Historical Provider, MD  methylPREDNISolone (MEDROL DOSEPAK) 4 MG TBPK tablet Take as directed on packet Patient  taking differently: Take 4 mg by mouth See admin instructions. Take as directed on package - tapered course 02/17/16  Yes Merryl Hacker, MD  Multiple Vitamin (MULTIVITAMIN WITH MINERALS) TABS tablet Take 1 tablet by mouth daily. Centrum   Yes Historical Provider, MD  naloxegol oxalate (MOVANTIK) 25 MG TABS tablet Take 25 mg by mouth 2 (two) times daily.   Yes Historical Provider, MD  naproxen sodium (ALEVE) 220 MG tablet Take 660 mg by mouth daily as needed (pain).   Yes Historical Provider, MD  Omega-3 Fatty Acids (FISH OIL) 1000 MG CAPS Take 1,000 mg by mouth daily.   Yes Historical Provider, MD  oxyCODONE-acetaminophen (PERCOCET) 10-325 MG tablet Take 1 tablet by mouth every 4 (four) hours as needed for pain.  02/18/16  Yes Historical Provider, MD  prednisoLONE acetate (PRED FORTE) 1 % ophthalmic suspension Place 1 drop into the left eye 3 (three) times daily. 01/30/16  Yes Historical Provider, MD  HYDROcodone-acetaminophen (NORCO/VICODIN) 5-325 MG tablet Take 1-2 tablets by mouth every 6 (six) hours as needed. Patient not taking: Reported on 02/21/2016 02/17/16   Merryl Hacker, MD  metoprolol succinate (TOPROL XL) 25 MG 24 hr tablet Take 1 tablet (25 mg total) by mouth daily. Patient not taking: Reported on 02/21/2016 06/08/14   Belva Crome, MD    Family History Family History  Problem Relation Age of Onset  . Other Mother     AGE 77 HEALTHY  . Heart attack Father 30    2 MI  . Emphysema    . Other Brother     HEALTHY    Social History Social History  Substance Use Topics  . Smoking status: Former Smoker    Packs/day: 2.00    Years: 20.00    Types: Cigarettes    Quit date: 03/27/1990  . Smokeless tobacco: Never Used  . Alcohol use 4.2 oz/week    7 Glasses of wine per week     Allergies   Other and Ciprofloxacin   Review of Systems Review of Systems  10 systems reviewed and found to be negative, except as noted in the HPI.   Physical Exam Updated Vital Signs BP  144/85 (BP Location: Right Arm)   Pulse 107   Temp 98.9 F (37.2 C) (Oral)   Resp (!) 27   Ht 6\' 2"  (1.88 m)   Wt 95.3 kg   SpO2 100%   BMI 26.96 kg/m   Physical Exam  Constitutional: He appears well-developed and well-nourished.  HENT:  Head: Normocephalic.  Dry MM  Eyes: Conjunctivae are normal.  Neck: Normal range of motion.  FROM to C-spine. Pt can touch chin to chest without discomfort. No TTP of midline cervical spine.   Cardiovascular: Normal rate, regular rhythm and intact distal pulses.   Pulmonary/Chest: Effort normal. No respiratory distress. He has no wheezes. He has no rales.  He exhibits no tenderness.  Abdominal: Soft. There is no tenderness.  Neurological: He is alert.  Oriented 3 but slightly confused and confabulating.  II-Visual fields grossly intact. III/IV/VI-Extraocular movements intact.  Pupils reactive bilaterally. V/VII-Smile symmetric, equal eyebrow raise,  facial sensation intact VIII- Hearing grossly intact IX/X-Normal gag XI-bilateral shoulder shrug XII-midline tongue extension Motor: 5/5 bilaterally with normal tone and bulk    +++ TTP along Lumbar spinous process at ~ L4  No saddle anesthesia.  No TTP or paraspinal muscular spasm. Strength is 5 out of 5 to bilateral lower extremities at hip and knee; extensor hallucis longus 5 out of 5. Ankle strength 5 out of 5, no clonus, neurovascularly intact. No saddle anaesthesia. Patellar reflexes are 2+ bilaterally.    Skin: Capillary refill takes less than 2 seconds.  Hot to the touch  Ecchymoses to left lower quadrant with dressing in place. Patient states that there was a biopsy performed there several weeks ago.  Psychiatric: He has a normal mood and affect.  Nursing note and vitals reviewed.    ED Treatments / Results  Labs (all labs ordered are listed, but only abnormal results are displayed) Labs Reviewed  COMPREHENSIVE METABOLIC PANEL - Abnormal; Notable for the following:        Result Value   Sodium 129 (*)    Chloride 94 (*)    CO2 21 (*)    Glucose, Bld 115 (*)    BUN 25 (*)    Total Protein 6.4 (*)    Albumin 3.0 (*)    All other components within normal limits  CBC WITH DIFFERENTIAL/PLATELET - Abnormal; Notable for the following:    WBC 11.0 (*)    HCT 38.9 (*)    Platelets 136 (*)    Neutro Abs 8.6 (*)    Monocytes Absolute 1.3 (*)    All other components within normal limits  URINALYSIS, ROUTINE W REFLEX MICROSCOPIC - Abnormal; Notable for the following:    APPearance HAZY (*)    Hgb urine dipstick MODERATE (*)    Protein, ur 30 (*)    All other components within normal limits  PROTIME-INR - Abnormal; Notable for the following:    Prothrombin Time 15.8 (*)    All other components within normal limits  I-STAT CHEM 8, ED - Abnormal; Notable for the following:    Sodium 131 (*)    Chloride 98 (*)    BUN 26 (*)    Glucose, Bld 121 (*)    All other components within normal limits  CULTURE, BLOOD (ROUTINE X 2)  CULTURE, BLOOD (ROUTINE X 2)  URINE CULTURE  RESPIRATORY PANEL BY PCR  ETHANOL  RAPID URINE DRUG SCREEN, HOSP PERFORMED  INFLUENZA PANEL BY PCR (TYPE A & B)  MAGNESIUM  PHOSPHORUS  TROPONIN I  TROPONIN I  LIPASE, BLOOD  I-STAT CG4 LACTIC ACID, ED  I-STAT CG4 LACTIC ACID, ED    EKG  EKG Interpretation  Date/Time:  Friday February 21 2016 06:28:14 EST Ventricular Rate:  166 PR Interval:    QRS Duration: 136 QT Interval:  326 QTC Calculation: 542 R Axis:   -10 Text Interpretation:  Atrial fibrillation with rvr IVCD, consider atypical RBBB Confirmed by Betsey Holiday  MD, CHRISTOPHER [phone]) on 02/21/2016 7:26:44 AM       Radiology Dg Chest Port 1 View  Result Date: 02/21/2016 CLINICAL DATA:  Sepsis. EXAM: PORTABLE CHEST 1 VIEW COMPARISON:  None. FINDINGS: Low lung volumes. Heart is prominent size, likely accentuated by a  low lung volumes. There is diffuse bronchial and interstitial thickening. Streaky bibasilar atelectasis. No  evidence of confluent pneumonia. No large pleural effusion or pneumothorax. No acute osseous abnormalities are seen. Advanced osteoarthritis of the right shoulder. IMPRESSION: Diffuse bronchial and interstitial thickening, may be pulmonary edema or atypical infection/bronchitis. Low lung volumes limit assessment. Electronically Signed   By: Jeb Levering M.D.   On: 02/21/2016 06:35   Ct Renal Stone Study  Result Date: 02/21/2016 CLINICAL DATA:  Low back pain and hematuria EXAM: CT ABDOMEN AND PELVIS WITHOUT CONTRAST TECHNIQUE: Multidetector CT imaging of the abdomen and pelvis was performed following the standard protocol without IV contrast. COMPARISON:  02/17/2016 FINDINGS: Lower chest: No acute abnormality. Hepatobiliary: No focal liver abnormality is seen. Status post cholecystectomy. No biliary dilatation. Pancreas: Unremarkable. No pancreatic ductal dilatation or surrounding inflammatory changes. Spleen: Normal in size without focal abnormality. Adrenals/Urinary Tract: The adrenal glands are within normal limits. Mild perinephric stranding is noted bilaterally. No obstructive changes are seen. The bladder is decompressed by Foley catheter. A 4.7 cm cyst is noted within the midportion of the right kidney. Stomach/Bowel: The appendix is within normal limits. Diverticular changes noted throughout the colon without evidence of diverticulitis. Vascular/Lymphatic: Aortic atherosclerosis. Increased soft tissue density is noted along the anterior aspect of the lumbar spine along the anterior aspect of the L3-4 interspace. There is a focal solid component which measures approximately 2.6 cm consistent with lymphadenopathy. Reproductive: Prostate is unremarkable. Other: No abdominal wall hernia or abnormality. No abdominopelvic ascites. Musculoskeletal: Slight decreased attenuation is noted in the medial aspect of the psoas muscles bilaterally slightly worse on the right than the left. This may be related to  muscular strain. No definitive fluid collection is seen. No acute bony abnormality is noted. IMPRESSION: No renal calculi or obstructive changes are noted. A right renal cyst is noted. Bilateral perinephric stranding is seen of uncertain significance. Increased soft tissue density anterior to the lumbar spine as described with a focal solid component likely related to lymphadenopathy. Slight decreased attenuation within the medial aspect of the psoas muscles bilaterally. This may be related to muscular strain. Electronically Signed   By: Inez Catalina M.D.   On: 02/21/2016 09:22    Procedures Procedures (including critical care time)  CRITICAL CARE Performed by: Monico Blitz   Total critical care time: 75 minutes  Critical care time was exclusive of separately billable procedures and treating other patients.  Critical care was necessary to treat or prevent imminent or life-threatening deterioration.  Critical care was time spent personally by me on the following activities: development of treatment plan with patient and/or surrogate as well as nursing, discussions with consultants, evaluation of patient's response to treatment, examination of patient, obtaining history from patient or surrogate, ordering and performing treatments and interventions, ordering and review of laboratory studies, ordering and review of radiographic studies, pulse oximetry and re-evaluation of patient's condition.   Medications Ordered in ED Medications  vancomycin (VANCOCIN) IVPB 1000 mg/200 mL premix (not administered)  piperacillin-tazobactam (ZOSYN) IVPB 3.375 g (not administered)  lisinopril-hydrochlorothiazide (PRINZIDE,ZESTORETIC) 20-12.5 MG per tablet 1 tablet (not administered)  acetaminophen (TYLENOL) tablet 650 mg (not administered)    Or  acetaminophen (TYLENOL) suppository 650 mg (not administered)  ondansetron (ZOFRAN) tablet 4 mg (not administered)    Or  ondansetron (ZOFRAN) injection 4 mg  (not administered)  polyethylene glycol (MIRALAX / GLYCOLAX) packet 17 g (not administered)  0.9 %  sodium chloride infusion (not administered)  hydrALAZINE (APRESOLINE) injection  10 mg (not administered)  lidocaine (LIDODERM) 5 % 2 patch (not administered)  ketorolac (TORADOL) 15 MG/ML injection 15 mg (not administered)  oseltamivir (TAMIFLU) capsule 75 mg (not administered)  atorvastatin (LIPITOR) tablet 40 mg (not administered)  Glycerin (Adult) 2.1 g suppository 1 suppository (not administered)  docusate (COLACE) 50 MG/5ML liquid 100 mg (not administered)  metoprolol (LOPRESSOR) injection 5 mg (not administered)  acetaminophen (TYLENOL) tablet 650 mg (650 mg Oral Given 02/21/16 0637)  sodium chloride 0.9 % bolus 1,000 mL (0 mLs Intravenous Stopped 02/21/16 0707)    And  sodium chloride 0.9 % bolus 1,000 mL (0 mLs Intravenous Stopped 02/21/16 0707)    And  sodium chloride 0.9 % bolus 1,000 mL (0 mLs Intravenous Stopped 02/21/16 0707)  adenosine (ADENOCARD) 6 MG/2ML injection 6 mg (6 mg Intravenous Given 02/21/16 0639)  diltiazem (CARDIZEM) 1 mg/mL load via infusion 15 mg (15 mg Intravenous Bolus from Bag 02/21/16 0714)  oseltamivir (TAMIFLU) capsule 75 mg (75 mg Oral Given 02/21/16 0802)  HYDROmorphone (DILAUDID) injection 0.5 mg (0.5 mg Intravenous Given 02/21/16 0810)  lidocaine (XYLOCAINE) 2 % jelly 1 application (1 application Topical Given 02/21/16 0832)  LORazepam (ATIVAN) injection 1 mg (1 mg Intravenous Given 02/21/16 0943)  piperacillin-tazobactam (ZOSYN) IVPB 3.375 g (0 g Intravenous Stopped 02/21/16 1100)  vancomycin (VANCOCIN) IVPB 1000 mg/200 mL premix (0 mg Intravenous Stopped 02/21/16 1132)  LORazepam (ATIVAN) injection 1 mg (1 mg Intravenous Given 02/21/16 1048)  LORazepam (ATIVAN) 2 MG/ML injection (1 mg  Given 02/21/16 1121)  sodium chloride 0.9 % bolus 1,000 mL (0 mLs Intravenous Stopped 02/21/16 1313)     Initial Impression / Assessment and Plan / ED Course  I have reviewed the triage vital  signs and the nursing notes.  Pertinent labs & imaging results that were available during my care of the patient were reviewed by me and considered in my medical decision making (see chart for details).     Vitals:   02/21/16 1330 02/21/16 1345 02/21/16 1516 02/21/16 1520  BP: 122/87 134/94 144/85   Pulse: (!) 125 100 107   Resp: (!) 29 20 (!) 27   Temp:    98.9 F (37.2 C)  TempSrc:    Oral  SpO2: 94% 94% 100%   Weight:      Height:        Medications  vancomycin (VANCOCIN) IVPB 1000 mg/200 mL premix (not administered)  piperacillin-tazobactam (ZOSYN) IVPB 3.375 g (not administered)  lisinopril-hydrochlorothiazide (PRINZIDE,ZESTORETIC) 20-12.5 MG per tablet 1 tablet (not administered)  acetaminophen (TYLENOL) tablet 650 mg (not administered)    Or  acetaminophen (TYLENOL) suppository 650 mg (not administered)  ondansetron (ZOFRAN) tablet 4 mg (not administered)    Or  ondansetron (ZOFRAN) injection 4 mg (not administered)  polyethylene glycol (MIRALAX / GLYCOLAX) packet 17 g (not administered)  0.9 %  sodium chloride infusion (not administered)  hydrALAZINE (APRESOLINE) injection 10 mg (not administered)  lidocaine (LIDODERM) 5 % 2 patch (not administered)  ketorolac (TORADOL) 15 MG/ML injection 15 mg (not administered)  oseltamivir (TAMIFLU) capsule 75 mg (not administered)  atorvastatin (LIPITOR) tablet 40 mg (not administered)  Glycerin (Adult) 2.1 g suppository 1 suppository (not administered)  docusate (COLACE) 50 MG/5ML liquid 100 mg (not administered)  metoprolol (LOPRESSOR) injection 5 mg (not administered)  acetaminophen (TYLENOL) tablet 650 mg (650 mg Oral Given 02/21/16 0637)  sodium chloride 0.9 % bolus 1,000 mL (0 mLs Intravenous Stopped 02/21/16 0707)    And  sodium chloride 0.9 % bolus  1,000 mL (0 mLs Intravenous Stopped 02/21/16 0707)    And  sodium chloride 0.9 % bolus 1,000 mL (0 mLs Intravenous Stopped 02/21/16 0707)  adenosine (ADENOCARD) 6 MG/2ML injection 6  mg (6 mg Intravenous Given 02/21/16 0639)  diltiazem (CARDIZEM) 1 mg/mL load via infusion 15 mg (15 mg Intravenous Bolus from Bag 02/21/16 0714)  oseltamivir (TAMIFLU) capsule 75 mg (75 mg Oral Given 02/21/16 0802)  HYDROmorphone (DILAUDID) injection 0.5 mg (0.5 mg Intravenous Given 02/21/16 0810)  lidocaine (XYLOCAINE) 2 % jelly 1 application (1 application Topical Given 02/21/16 0832)  LORazepam (ATIVAN) injection 1 mg (1 mg Intravenous Given 02/21/16 0943)  piperacillin-tazobactam (ZOSYN) IVPB 3.375 g (0 g Intravenous Stopped 02/21/16 1100)  vancomycin (VANCOCIN) IVPB 1000 mg/200 mL premix (0 mg Intravenous Stopped 02/21/16 1132)  LORazepam (ATIVAN) injection 1 mg (1 mg Intravenous Given 02/21/16 1048)  LORazepam (ATIVAN) 2 MG/ML injection (1 mg  Given 02/21/16 1121)  sodium chloride 0.9 % bolus 1,000 mL (0 mLs Intravenous Stopped 02/21/16 1313)    ALPHA GEERS is 71 y.o. male presenting with Confusion, low back pain, febrile and tachycardic, per EMS his heart rate was in the 170s, regular rhythm strips. His heart rate is in the 130s when I evaluate him. However, heart rate jumps up to 170s again also appearing regular narrow complex. Code sepsis is initiated, pancultured, broad-spectrum antibiotics. No meningeal signs he is slightly confused but I think this may be secondary to the sepsis. Adenosine is given an heart rate slowed to 150 revealing irregular fib/flutter and patient started on Cardizem drip. Patient remains asymptomatic, alert with no palpitations chest pain, shortness of breath.  Urinalysis without signs of infection, normal lactic acid, normal renal function. No significant electrolyte abnormality. Urinalysis does have hemoglobin, this is likely from dehydration but will obtain stone protocol CT to further evaluate. While he is pending MRI.  CXR with possible atypical infection  Post void residual is over 600 however, patient states he really can't urinate laying down and he's had difficulty  sitting up because his back pain. Wheezes have stood this patient up and he was able to produce about 300 mL of urine however, given his aggressive hydration for sepsis protocol and the fact that he should will need to lie on the table for next end. Time for an MRI early think he would benefit from a Foley.  Orthopedic consult from Dr. Gladstone Lighter appreciated: I have been able to reach him on his cell phone secondary to [facility] giving me the number. 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 He states he scheduled him for a stat MRI because he had bilateral leg pain yesterday.  Discussed case with radiologist Dr. [place] Pink who is not able to see the MRI that was performed yesterday but has taken a look at the CAT scan and is concerned about an occult discitis and would recommend repeating the MRI with and without contrast.  Ovid Curd ID Pharmacist agrees with broad spectrum Abx Vanc and Zosyn: states will cover a spinal epidural abscess/discitis.  Discussed LP with attending and we think that the source is not likely meningitis given his lack of nuchal rigidity and focal finding on lumbar physical exam.  Patient is becoming slightly more agitated and confused, will need Ativan prior to MRI.  I walked to MRI because there've been issues with phone and I need to talk to RN The last systolic was 94, changed order verbally to one of Ativan and asked nurse to having a bag of saline. I doubt that this patient will be able to comply with the instructions for the MRI he is becoming increasingly agitated.  Informed by RN that MRI tech says the patient is to mobile and the MRI will not be useful, DC'd MRI patient is brought back to ED.  Neurosurgery consulted for presumed diskitis.  Delay in obtaining  neurosurgery consult, I think they may be in the operating room.  Patient given sitter to watch him so that he doesn't pull off his leads are IV. He is becoming increasingly agitated. Repeat lactic acid normal. This is an unassigned admission to [facility] hospitalist:  Dr. Gladstone Lighter has called to check on the patient, I have updated him as to status, states that he will come to the ED to evaluate the patient is well. We'll fax over the MRI read.  Neurosurgery APP Babs Sciara has evaluated the patient he will get in touch with his attending and asked for a face to face evaluation.   Neurosurgery has recommended continuing with vancomycin and Zosyn. Will follow him on the floor.  Unassigned admission to [facility] hospitalist Dr. Aggie Moats who accepts admission to step down bed.  I discussed the findings with his friend Cloyde Reams and the plan. I have offered to send a [facility] office or to the [facility] to pick up the MRI I feel that this would aid the neurosurgeons, Cloyde Reams says that she will pick it up herself.  Final Clinical Impressions(s) / ED Diagnoses   Final diagnoses:  Low back pain  Atrial fibrillation with RVR ([place])  Fever  Sepsis, due to unspecified organism [facility])    New Prescriptions New Prescriptions   No medications on file     Monico Blitz, PA-C 02/21/16 [place], PA-C 02/21/16 [address] PA-C 02/21/16 1523    Isla Pence, MD 02/21/16 [phone]

## 2016-02-21 NOTE — ED Notes (Addendum)
Pollina MD and Pisciotta PA at bedside, preparing to give 6mg  Adenosine

## 2016-02-21 NOTE — ED Notes (Signed)
EDP aware pt HR went from 130's to 180's. EDP at bedside to evaluate, repeat EKG obtained

## 2016-02-21 NOTE — Consult Note (Signed)
Reason for Consult:Back Pain  Referring Physician: Patient was FIRST SEEN IN MY OFFICE THIS TUESDAY WITH BACK PAIN only, SECONDARY TO pushing an Object.He thewn saw his Chiropractor and then Saw me two days later on Thursday,Feb.2. At this time he had Low back pain and some pain in his buttocks. He remained neurologically intact. I ordered a STAT MRI at our office last night,Thursday,Feb.1. 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  Jonathan Holden is an 71 y.o. male.  HPI: See above  Past Medical History:  Diagnosis Date  . Arthritis HANDS AND RIGHT SHOULDER  . Coronary artery disease CARDIOLOGIST- DR Daneen Schick- LAST VISIT 6 MON AGO--  REQUESTED NOTE, EKG, ECHO, STRESS TEST   PT DENIES S & S  . H/O gastric ulcer   . History of MI (myocardial infarction) 1992  . Hyperlipidemia   . Hypertension   . Loose body of right shoulder   . S/P coronary angioplasty     Past Surgical History:  Procedure Laterality Date  . CARDIOVASCULAR STRESS TEST  08-22-2008---  DR Daneen Schick   NO ISCHEMIA.   Marland Kitchen CORONARY ANGIOPLASTY  1992  . ERCP W/ SPHICTEROTOMY  12-15-2002   AND STONE EXTRACTION  . LAPAROSCOPIC CHOLECYSTECTOMY  12-16-2002  . LUMBAR LAMINECTOMY  20 YRS AGO   L5    Family History  Problem Relation Age of Onset  . Other Mother     AGE 46 HEALTHY  . Heart attack Father 22    2 MI  . Emphysema    . Other Brother     HEALTHY    Social History:  reports that he quit smoking about 25 years ago. His smoking use included Cigarettes. He has a 40.00 pack-year smoking history. He has never used smokeless tobacco. He reports that he drinks about 4.2 oz of alcohol per week . He reports that he does not use drugs.  Allergies:  Allergies  Allergen Reactions  . Ciprofloxacin Rash    Unsure if this is exactly what he had a reaction to.     Medications: I have reviewed the patient's current medications.  Results for orders placed or performed during the hospital encounter of 02/21/16 (from the past 48 hour(s))  Comprehensive metabolic panel     Status: Abnormal   Collection Time: 02/21/16  6:12 AM  Result Value Ref Range   Sodium 129 (L) 135 - 145 mmol/L   Potassium 3.9 3.5 - 5.1 mmol/L   Chloride 94 (L) 101 - 111 mmol/L   CO2 21 (L) 22 - 32 mmol/L   Glucose, Bld 115 (H) 65 - 99 mg/dL   BUN 25 (H) 6 - 20 mg/dL   Creatinine, Ser 0.93 0.61 - 1.24 mg/dL   Calcium 9.7 8.9 - 10.3 mg/dL   Total Protein 6.4 (L) 6.5 - 8.1 g/dL   Albumin 3.0 (L) 3.5 - 5.0 g/dL   AST 30 15 - 41 U/L   ALT 26 17 - 63 U/L   Alkaline Phosphatase 58 38 - 126 U/L   Total Bilirubin 1.0 0.3 - 1.2 mg/dL   GFR calc non Af Amer >60 >60 mL/min   GFR calc Af Amer >60 >60 mL/min    Comment: (NOTE) The eGFR has been calculated using the  CKD EPI equation. This calculation has not been validated in all clinical situations. eGFR's persistently <60 mL/min signify possible Chronic Kidney Disease.    Anion gap 14 5 - 15  CBC WITH DIFFERENTIAL     Status: Abnormal   Collection Time: 02/21/16  6:12 AM  Result Value Ref Range   WBC 11.0 (H) 4.0 - 10.5 K/uL   RBC 4.25 4.22 - 5.81 MIL/uL   Hemoglobin 13.7 13.0 - 17.0 g/dL   HCT 38.9 (L) 39.0 - 52.0 %   MCV 91.5 78.0 - 100.0 fL   MCH 32.2 26.0 - 34.0 pg   MCHC 35.2 30.0 - 36.0 g/dL   RDW 13.9 11.5 - 15.5 %   Platelets 136 (L) 150 - 400 K/uL   Neutrophils Relative % 79 %   Neutro Abs 8.6 (H) 1.7 - 7.7 K/uL   Lymphocytes Relative 10 %   Lymphs Abs 1.1 0.7 - 4.0 K/uL   Monocytes Relative 11 %   Monocytes Absolute 1.3 (H) 0.1 - 1.0 K/uL   Eosinophils Relative 0 %   Eosinophils Absolute 0.0 0.0 - 0.7 K/uL   Basophils Relative 0 %   Basophils Absolute 0.0 0.0 - 0.1 K/uL  Rapid urine drug screen (hospital performed)     Status: None   Collection Time:  02/21/16  6:24 AM  Result Value Ref Range   Opiates NONE DETECTED NONE DETECTED   Cocaine NONE DETECTED NONE DETECTED   Benzodiazepines NONE DETECTED NONE DETECTED   Amphetamines NONE DETECTED NONE DETECTED   Tetrahydrocannabinol NONE DETECTED NONE DETECTED   Barbiturates NONE DETECTED NONE DETECTED    Comment:        DRUG SCREEN FOR MEDICAL PURPOSES ONLY.  IF CONFIRMATION IS NEEDED FOR ANY PURPOSE, NOTIFY LAB WITHIN 5 DAYS.        LOWEST DETECTABLE LIMITS FOR URINE DRUG SCREEN Drug Class       Cutoff (ng/mL) Amphetamine      1000 Barbiturate      200 Benzodiazepine   427 Tricyclics       062 Opiates          300 Cocaine          300 THC              50   Urinalysis, Routine w reflex microscopic     Status: Abnormal   Collection Time: 02/21/16  6:26 AM  Result Value Ref Range   Color, Urine YELLOW YELLOW   APPearance HAZY (A) CLEAR   Specific Gravity, Urine 1.020 1.005 - 1.030   pH 5.0 5.0 - 8.0   Glucose, UA NEGATIVE NEGATIVE mg/dL   Hgb urine dipstick MODERATE (A) NEGATIVE   Bilirubin Urine NEGATIVE NEGATIVE   Ketones, ur NEGATIVE NEGATIVE mg/dL   Protein, ur 30 (A) NEGATIVE mg/dL   Nitrite NEGATIVE NEGATIVE   Leukocytes, UA NEGATIVE NEGATIVE   RBC / HPF 0-5 0 - 5 RBC/hpf   WBC, UA 0-5 0 - 5 WBC/hpf   Bacteria, UA NONE SEEN NONE SEEN   Squamous Epithelial / LPF NONE SEEN NONE SEEN   Mucous PRESENT   Ethanol     Status: None   Collection Time: 02/21/16  6:26 AM  Result Value Ref Range   Alcohol, Ethyl (B) <5 <5 mg/dL    Comment:        LOWEST DETECTABLE LIMIT FOR SERUM ALCOHOL IS 5 mg/dL FOR MEDICAL PURPOSES ONLY   I-Stat CG4 Lactic Acid, ED  (not at  [facility])     Status: None   Collection Time: 02/21/16  6:29 AM  Result Value Ref Range   Lactic Acid, Venous 1.11 0.5 - 1.9 mmol/L  I-Stat Chem 8, ED     Status: Abnormal   Collection Time: 02/21/16  6:33 AM  Result Value Ref Range   Sodium 131 (L) 135 - 145 mmol/L   Potassium 3.9 3.5 - 5.1 mmol/L   Chloride  98 (L) 101 - 111 mmol/L   BUN 26 (H) 6 - 20 mg/dL   Creatinine, Ser 0.80 0.61 - 1.24 mg/dL   Glucose, Bld 121 (H) 65 - 99 mg/dL   Calcium, Ion 1.15 1.15 - 1.40 mmol/L   TCO2 23 0 - 100 mmol/L   Hemoglobin 13.3 13.0 - 17.0 g/dL   HCT 39.0 39.0 - 52.0 %  Influenza panel by PCR (type A & B)     Status: None   Collection Time: 02/21/16  6:47 AM  Result Value Ref Range   Influenza A By PCR NEGATIVE NEGATIVE   Influenza B By PCR NEGATIVE NEGATIVE    Comment: (NOTE) The Xpert Xpress Flu assay is intended as an aid in the diagnosis of  influenza and should not be used as a sole basis for treatment.  This  assay is FDA approved for nasopharyngeal swab specimens only. Nasal  washings and aspirates are unacceptable for Xpert Xpress Flu testing.   Protime-INR     Status: Abnormal   Collection Time: 02/21/16  6:47 AM  Result Value Ref Range   Prothrombin Time 15.8 (H) 11.4 - 15.2 seconds   INR 1.26   I-Stat CG4 Lactic Acid, ED  (not at  [facility])     Status: None   Collection Time: 02/21/16 10:59 AM  Result Value Ref Range   Lactic Acid, Venous 1.71 0.5 - 1.9 mmol/L    Dg Chest Port 1 View  Result Date: 02/21/2016 CLINICAL DATA:  Sepsis. EXAM: PORTABLE CHEST 1 VIEW COMPARISON:  None. FINDINGS: Low lung volumes. Heart is prominent size, likely accentuated by a low lung volumes. There is diffuse bronchial and interstitial thickening. Streaky bibasilar atelectasis. No evidence of confluent pneumonia. No large pleural effusion or pneumothorax. No acute osseous abnormalities are seen. Advanced osteoarthritis of the right shoulder. IMPRESSION: Diffuse bronchial and interstitial thickening, may be pulmonary edema or atypical infection/bronchitis. Low lung volumes limit assessment. Electronically Signed   By: Jeb Levering M.D.   On: 02/21/2016 06:35   Ct Renal Stone Study  Result Date: 02/21/2016 CLINICAL DATA:  Low back pain and hematuria EXAM: CT ABDOMEN AND PELVIS WITHOUT CONTRAST TECHNIQUE:  Multidetector CT imaging of the abdomen and pelvis was performed following the standard protocol without IV contrast. COMPARISON:  02/17/2016 FINDINGS: Lower chest: No acute abnormality. Hepatobiliary: No focal liver abnormality is seen. Status post cholecystectomy. No biliary dilatation. Pancreas: Unremarkable. No pancreatic ductal dilatation or surrounding inflammatory changes. Spleen: Normal in size without focal abnormality. Adrenals/Urinary Tract: The adrenal glands are within normal limits. Mild perinephric stranding is noted bilaterally. No obstructive changes are seen. The bladder is decompressed by Foley catheter. A 4.7 cm cyst is noted within the midportion of the right kidney. Stomach/Bowel: The appendix is within normal limits. Diverticular changes noted throughout the colon without evidence of diverticulitis. Vascular/Lymphatic: Aortic atherosclerosis. Increased soft tissue density is noted along the anterior aspect of the lumbar spine along the anterior aspect of the L3-4 interspace. There is a focal solid component which measures approximately 2.6 cm consistent with  lymphadenopathy. Reproductive: Prostate is unremarkable. Other: No abdominal wall hernia or abnormality. No abdominopelvic ascites. Musculoskeletal: Slight decreased attenuation is noted in the medial aspect of the psoas muscles bilaterally slightly worse on the right than the left. This may be related to muscular strain. No definitive fluid collection is seen. No acute bony abnormality is noted. IMPRESSION: No renal calculi or obstructive changes are noted. A right renal cyst is noted. Bilateral perinephric stranding is seen of uncertain significance. Increased soft tissue density anterior to the lumbar spine as described with a focal solid component likely related to lymphadenopathy. Slight decreased attenuation within the medial aspect of the psoas muscles bilaterally. This may be related to muscular strain. Electronically Signed   By:  Inez Catalina M.D.   On: 02/21/2016 09:22    Review of Systems  Constitutional: Positive for fever.  HENT: Negative.   Eyes: Negative.   Cardiovascular: Positive for palpitations.  Gastrointestinal: Negative.   Genitourinary: Negative.   Musculoskeletal: Positive for back pain.  Skin: Negative.   Neurological: Positive for speech change.  Endo/Heme/Allergies: Negative.   Psychiatric/Behavioral: Positive for hallucinations.   Blood pressure 129/83, pulse 120, temperature 98.2 F (36.8 C), temperature source Oral, resp. rate (!) 32, height 6' 2"  (1.88 m), weight 95.3 kg (210 lb), SpO2 95 %. Physical Exam  Constitutional: He appears distressed.  HENT:  Head: Normocephalic.  Eyes: Pupils are equal, round, and reactive to light.  Neck: Normal range of motion.  Cardiovascular:  Irregular heart beat  Respiratory: Effort normal.  GI: Soft.  Musculoskeletal: He exhibits tenderness.  Back Pain  Neurological:  Confused.  Skin: Skin is dry.  Psychiatric:  Very confused.    Assessment/Plan: Admit for further evaluation of Fever and Atrial Fibrillation.  Analena Gama A 02/21/2016, 1:31 PM

## 2016-02-21 NOTE — Telephone Encounter (Signed)
Left message for Jonathan Holden to call back in regards to what kind of medical release form they need.  Unsure if it's for records or if she's referring to a DPR.    Will route this message to Dr. Tamala Julian to make him aware that pt is in the hospital.

## 2016-02-21 NOTE — H&P (Signed)
[facility] History and Physical  Jonathan Holden [id number] DOB: 1945/09/09 DOA: 02/21/2016  Referring physician:  PCP: Sinclair Grooms, MD   Chief Complaint: "Tell that guy over there to put his hand in the wall."  HPI: Jonathan Holden is a 71 y.o. male  with past mental history of heart attack, high blood pressure, hyperlipidemia, ulcer, shoulder issues, coronary angioplasty, arthritis since the emergency room with altered mental status. Patient's history is mostly gathered from Amanda and patient's partner. Patient recently had increase in lower back pain. He then came to the emergency room for evaluation and was given a prescription of steroids and hydrocodone. Midway through taking this medication patient noted to his partner that he felt weird. Partner states that he occasionally said odd things but patent no mind. Patient's back pain did not improve and they sought out medical attention again. On his return visit patient was given Flexeril and Percocet in addition to his steroids. Patient gradually became more confused. Per partner patient had no other complaints, just back pain.  ED course: Sepsis called. Patient placed on Zosyn and vancomycin. Flu swab was done which was negative. Cultures taken. Patient was found to be in a rapid supraventricular rhythm was given adenosine. Patient noted to be in A. fib with RVR. Started on diltiazem drip. Patient then spontaneously converted to normal sinus rhythm. Hospitalist were consulted for admission. Neurosurgery was consulted for evaluation of possible epidural abscess.   Review of Systems:  Due to altered mental status could not get complete review of systems.   Past Medical History:  Diagnosis Date  . Arthritis HANDS AND RIGHT SHOULDER  . Coronary artery disease CARDIOLOGIST- DR Daneen Schick- LAST VISIT 6 MON AGO--  REQUESTED NOTE, EKG, ECHO, STRESS TEST   PT DENIES S & S  . H/O gastric ulcer   . History of MI (myocardial  infarction) 1992  . Hyperlipidemia   . Hypertension   . Loose body of right shoulder   . S/P coronary angioplasty    Past Surgical History:  Procedure Laterality Date  . CARDIOVASCULAR STRESS TEST  08-22-2008---  DR Daneen Schick   NO ISCHEMIA.   Marland Kitchen CORONARY ANGIOPLASTY  1992  . ERCP W/ SPHICTEROTOMY  12-15-2002   AND STONE EXTRACTION  . LAPAROSCOPIC CHOLECYSTECTOMY  12-16-2002  . LUMBAR LAMINECTOMY  20 YRS AGO   L5   Social History:  reports that he quit smoking about 25 years ago. His smoking use included Cigarettes. He has a 40.00 pack-year smoking history. He has never used smokeless tobacco. He reports that he drinks about 4.2 oz of alcohol per week . He reports that he does not use drugs.  Allergies  Allergen Reactions  . Other Other (See Comments)    Pt reports allergic reaction to melons - unknown reaction  . Ciprofloxacin Hives    Family History  Problem Relation Age of Onset  . Other Mother     AGE 55 HEALTHY  . Heart attack Father 87    2 MI  . Emphysema    . Other Brother     HEALTHY     Prior to Admission medications   Medication Sig Start Date End Date Taking? Authorizing Provider  Alirocumab (PRALUENT) 150 MG/ML SOPN Inject 150 mg into the skin every 14 (fourteen) days. 07/17/15  Yes Belva Crome, MD  atorvastatin (LIPITOR) 80 MG tablet Take 40 mg by mouth daily.   Yes Historical Provider, MD  Coenzyme Q10 (COQ10 PO) Take  1 capsule by mouth daily.   Yes Historical Provider, MD  cyclobenzaprine (FLEXERIL) 10 MG tablet Take 10 mg by mouth 2 (two) times daily.  02/18/16  Yes Historical Provider, MD  GLUCOSAMINE-CHONDROITIN PO Take 2 tablets by mouth daily.   Yes Historical Provider, MD  lisinopril-hydrochlorothiazide (PRINZIDE,ZESTORETIC) 20-12.5 MG tablet Take 1 tablet by mouth daily. Patient is overdue for an appointment. Please call and schedule for further refills 11/06/15  Yes Belva Crome, MD  Melatonin 10 MG TABS Take 10 mg by mouth at bedtime as needed  (sleep).   Yes Historical Provider, MD  methylPREDNISolone (MEDROL DOSEPAK) 4 MG TBPK tablet Take as directed on packet Patient taking differently: Take 4 mg by mouth See admin instructions. Take as directed on package - tapered course 02/17/16  Yes Merryl Hacker, MD  Multiple Vitamin (MULTIVITAMIN WITH MINERALS) TABS tablet Take 1 tablet by mouth daily. Centrum   Yes Historical Provider, MD  naloxegol oxalate (MOVANTIK) 25 MG TABS tablet Take 25 mg by mouth 2 (two) times daily.   Yes Historical Provider, MD  Omega-3 Fatty Acids (FISH OIL) 1000 MG CAPS Take 1,000 mg by mouth daily.   Yes Historical Provider, MD  oxyCODONE-acetaminophen (PERCOCET) 10-325 MG tablet Take 1 tablet by mouth every 4 (four) hours as needed for pain.  02/18/16  Yes Historical Provider, MD  HYDROcodone-acetaminophen (NORCO/VICODIN) 5-325 MG tablet Take 1-2 tablets by mouth every 6 (six) hours as needed. Patient not taking: Reported on 02/21/2016 02/17/16   Merryl Hacker, MD  metoprolol succinate (TOPROL XL) 25 MG 24 hr tablet Take 1 tablet (25 mg total) by mouth daily. Patient not taking: Reported on 02/21/2016 06/08/14   Belva Crome, MD   Physical Exam: Vitals:   02/21/16 1300 02/21/16 1315 02/21/16 1330 02/21/16 1345  BP:   122/87 134/94  Pulse: 116 118 (!) 125 100  Resp: (!) 28 (!) 29 (!) 29 20  Temp:      TempSrc:      SpO2: 100% 96% 94% 94%  Weight:      Height:        Wt Readings from Last 3 Encounters:  02/21/16 95.3 kg (210 lb)  02/17/16 95.3 kg (210 lb)  10/19/14 97.4 kg (214 lb 12.8 oz)    General:  Appears calm and comfortable, A&Ox1 Eyes:  PERRL, EOMI, normal lids, iris ENT:  grossly normal hearing, lips & tongue Neck:  no LAD, masses or thyromegaly Cardiovascular:  Tachy RR, no m/r/g. No LE edema.  Respiratory:  CTA bilaterally, no w/r/r. Normal respiratory effort. Abdomen:  soft, ntnd Skin: no rash or induration seen on limited exam Musculoskeletal:  grossly normal tone  BUE/BLE Psychiatric:  grossly normal mood and affect, speech fluent and inappropriate Neurologic:  CN 2-12 grossly intact, moves all extremities in coordinated fashion.          Labs on Admission:  Basic Metabolic Panel:  Recent Labs Lab 02/21/16 0612 02/21/16 0633  NA 129* 131*  K 3.9 3.9  CL 94* 98*  CO2 21*  --   GLUCOSE 115* 121*  BUN 25* 26*  CREATININE 0.93 0.80  CALCIUM 9.7  --    Liver Function Tests:  Recent Labs Lab 02/21/16 0612  AST 30  ALT 26  ALKPHOS 58  BILITOT 1.0  PROT 6.4*  ALBUMIN 3.0*   No results for input(s): LIPASE, AMYLASE in the last 168 hours. No results for input(s): AMMONIA in the last 168 hours. CBC:  Recent Labs  Lab 02/21/16 0612 02/21/16 0633  WBC 11.0*  --   NEUTROABS 8.6*  --   HGB 13.7 13.3  HCT 38.9* 39.0  MCV 91.5  --   PLT 136*  --    Cardiac Enzymes: No results for input(s): CKTOTAL, CKMB, CKMBINDEX, TROPONINI in the last 168 hours.  BNP (last 3 results) No results for input(s): BNP in the last 8760 hours.  ProBNP (last 3 results) No results for input(s): PROBNP in the last 8760 hours.   Serum creatinine: 0.8 mg/dL 02/21/16 [id number] Estimated creatinine clearance: 98.5 mL/min  CBG: No results for input(s): GLUCAP in the last 168 hours.  Radiological Exams on Admission: Dg Chest Port 1 View  Result Date: 02/21/2016 CLINICAL DATA:  Sepsis. EXAM: PORTABLE CHEST 1 VIEW COMPARISON:  None. FINDINGS: Low lung volumes. Heart is prominent size, likely accentuated by a low lung volumes. There is diffuse bronchial and interstitial thickening. Streaky bibasilar atelectasis. No evidence of confluent pneumonia. No large pleural effusion or pneumothorax. No acute osseous abnormalities are seen. Advanced osteoarthritis of the right shoulder. IMPRESSION: Diffuse bronchial and interstitial thickening, may be pulmonary edema or atypical infection/bronchitis. Low lung volumes limit assessment. Electronically Signed   By: Jeb Levering M.D.   On: 02/21/2016 06:35   Ct Renal Stone Study  Result Date: 02/21/2016 CLINICAL DATA:  Low back pain and hematuria EXAM: CT ABDOMEN AND PELVIS WITHOUT CONTRAST TECHNIQUE: Multidetector CT imaging of the abdomen and pelvis was performed following the standard protocol without IV contrast. COMPARISON:  02/17/2016 FINDINGS: Lower chest: No acute abnormality. Hepatobiliary: No focal liver abnormality is seen. Status post cholecystectomy. No biliary dilatation. Pancreas: Unremarkable. No pancreatic ductal dilatation or surrounding inflammatory changes. Spleen: Normal in size without focal abnormality. Adrenals/Urinary Tract: The adrenal glands are within normal limits. Mild perinephric stranding is noted bilaterally. No obstructive changes are seen. The bladder is decompressed by Foley catheter. A 4.7 cm cyst is noted within the midportion of the right kidney. Stomach/Bowel: The appendix is within normal limits. Diverticular changes noted throughout the colon without evidence of diverticulitis. Vascular/Lymphatic: Aortic atherosclerosis. Increased soft tissue density is noted along the anterior aspect of the lumbar spine along the anterior aspect of the L3-4 interspace. There is a focal solid component which measures approximately 2.6 cm consistent with lymphadenopathy. Reproductive: Prostate is unremarkable. Other: No abdominal wall hernia or abnormality. No abdominopelvic ascites. Musculoskeletal: Slight decreased attenuation is noted in the medial aspect of the psoas muscles bilaterally slightly worse on the right than the left. This may be related to muscular strain. No definitive fluid collection is seen. No acute bony abnormality is noted. IMPRESSION: No renal calculi or obstructive changes are noted. A right renal cyst is noted. Bilateral perinephric stranding is seen of uncertain significance. Increased soft tissue density anterior to the lumbar spine as described with a focal solid component  likely related to lymphadenopathy. Slight decreased attenuation within the medial aspect of the psoas muscles bilaterally. This may be related to muscular strain. Electronically Signed   By: Inez Catalina M.D.   On: 02/21/2016 09:22    EKG: Independently reviewed. Sinus tach on EKG. Awaiting repeat.  Assessment/Plan Principal Problem:   Sepsis ([place]) Active Problems:   Hyperlipidemia   CAD (coronary artery disease), native coronary artery   Essential hypertension   Hyponatremia   Atrial fibrillation with RVR (HCC)   Back pain   Acute encephalopathy   Sepsis 2/2 unk but suspect resp source based on CXR Patient hemodynamically stable Given vanc  emergency room, will continue Given zosyn in the emergency room, will continue Urine culture pending Blood cultures 2 pending Patient given 3000 mL of fluid in the emergency room Lactic acid normal Lipase pending UDS neg Flu neg, resp viral panel pending Dehydrated - 150cc/hr till tomorrow AM I&O On tamiflu, I've seen patient's become pos on repeat testing for flu, will cont med given AMS  Acute encephalopathy Differential diagnosis includes infection versus medications versus dehydration versus hyponatremia versus multifactorial Will treat regarding issues off on sepsis pathway and will holding medications that may alter patient's mental status No steroids and avoid narcotics  Low Na Likely from dehydration Serial Na  Paroxsysmal Afib Now in SR with tachy ECHO tomorrow Cycle trop Mg an phos pending Call Cardiology tomorrow Prn lopressor  Constipation Likely opioid induced Glycerin supp x 2 followed by colace  Ruptured disk in L spine Mgmt per neurosurg Will need MRI likely under sedation with anesthesia Lidocaine patch & toradol for pain Avoid blood thinners since pt getting eval for possible epidural abscess that would req drainage  Hypertension When necessary hydralazine 10 mg IV as needed for severe blood  pressure Cont prinizide  Hyperlipidemia/CAD Continue statin   Code Status: FULL  DVT Prophylaxis: SCDs Family Communication: partner at bedside (anxious) Disposition Plan: Pending Improvement  Status: Inpt SDU  Elwin Mocha, MD Family Medicine [facility] [email or web address] Password [email or web address]

## 2016-02-21 NOTE — Consult Note (Signed)
Pt seen and examined at the request of Dyanne Iha reports that patient presented for low back pain. Found to have Afib with RVR and septic. Obtained CT which showed: "No renal calculi or obstructive changes are noted. A right renal cyst is noted. Bilateral perinephric stranding is seen of uncertain significance. Increased soft tissue density anterior to the lumbar spine as described with a focal solid component likely related to  Lymphadenopathy. Slight decreased attenuation within the medial aspect of the psoas muscles bilaterally. This may be related to muscular strain"  Radiology recommended MRI. Unable to get that at this time.  He was placed on Vancomycin and Zosyn empirically for possible epidural abscess vs. Discitis. She reports his neuro exam upon arrival was normal & able to follow commands  EXAM: Temp:  [98.2 F (36.8 C)-101.3 F (38.5 C)] 98.2 F (36.8 C) (02/02 1017) Pulse Rate:  [93-156] 120 (02/02 1245) Resp:  [16-32] 32 (02/02 1245) BP: (98-152)/(68-102) 129/83 (02/02 1245) SpO2:  [91 %-100 %] 95 % (02/02 1245) Weight:  [95.3 kg (210 lb)] 95.3 kg (210 lb) (02/02 0556) Intake/Output      02/01 0701 - 02/02 0700 02/02 0701 - 02/03 0700   IV Piggyback  4250   Total Intake(mL/kg)  4250 (44.6)   Urine (mL/kg/hr) 25 805 (1.3)   Total Output 25 805   Net -25 +3445          PE Awake and alert Oriented to date and location Able to follow commands Strength appears appropriate Reflexes normal Sensation normal   Assessment Low back pain Fever Sepsis  Plan Discussed with Dr Kathyrn Sheriff No neuro deficits on exam at this time. Able to follow commands.  At this time, will continue Zosyn and Vanc Continue to monitor neuro exam and report any changes Obtain MRI when feasible  Call for any concerns.

## 2016-02-21 NOTE — Progress Notes (Signed)
.   Pharmacy Antibiotic Note - Vancomycin & Zosyn  Jonathan Holden is a 71 y.o. male admitted on 02/21/2016 with sepsis.  Pharmacy has been consulted for vancomycin and Zosyn dosing.   Initial labs in ED were WBC 11, lactic acid 1.71, tachycardic at 0000000, systolic BP [id number], RR 28. Renal function remains stable, SCr 0.8.    Plan: Zosyn 3.375g IV q8h (4 hour infusion).  Vancomycin 2 g load followed by 1,000 mg q8hours.  F/U culture sensitivities, renal function, and further therapy plan.  Height: 6\' 2"  (188 cm) Weight: 210 lb (95.3 kg) IBW/kg (Calculated) : 82.2  Temp (24hrs), Avg:99.4 F (37.4 C), Min:98.2 F (36.8 C), Max:101.3 F (38.5 C)   Recent Labs Lab 02/21/16 0612 02/21/16 0629 02/21/16 0633 02/21/16 1059  WBC 11.0*  --   --   --   CREATININE 0.93  --  0.80  --   LATICACIDVEN  --  1.11  --  1.71    Estimated Creatinine Clearance: 98.5 mL/min (by C-G formula based on SCr of 0.8 mg/dL).    Allergies  Allergen Reactions  . Ciprofloxacin Rash    Unsure if this is exactly what he had a reaction to.     Antimicrobials this admission: Zosyn 2/2 >>  Vancomycin 2/2 >>  Tamiflu 2/2 >> 2/2  Dose adjustments this admission: N/A  Microbiology results: 2/2 BCx: pending 2/2 UCx: pending 2/2 Influenza panel: negative   Thank you for allowing pharmacy to be a part of this patient's care.  Danya Spearman L Edis Huish 02/21/2016 11:01 AM

## 2016-02-21 NOTE — Consult Note (Signed)
Enter in error

## 2016-02-21 NOTE — ED Notes (Signed)
No longer gave additional Adenosine. EDP states pt in a-fib RVR. Will place order for Cardizem.

## 2016-02-22 ENCOUNTER — Other Ambulatory Visit: Payer: Self-pay

## 2016-02-22 DIAGNOSIS — M545 Low back pain: Secondary | ICD-10-CM

## 2016-02-22 DIAGNOSIS — G9340 Encephalopathy, unspecified: Secondary | ICD-10-CM

## 2016-02-22 DIAGNOSIS — E785 Hyperlipidemia, unspecified: Secondary | ICD-10-CM

## 2016-02-22 LAB — URINE CULTURE: Culture: <10000 — AB

## 2016-02-22 LAB — CBC WITH DIFFERENTIAL/PLATELET
Basophils Absolute: 0 10*3/uL (ref 0.0–0.1)
Basophils Relative: 0 %
Eosinophils Absolute: 0.1 10*3/uL (ref 0.0–0.7)
Eosinophils Relative: 1 %
HCT: 33.2 % — ABNORMAL LOW (ref 39.0–52.0)
HEMOGLOBIN: 11.5 g/dL — AB (ref 13.0–17.0)
Lymphocytes Relative: 11 %
Lymphs Abs: 1.2 10*3/uL (ref 0.7–4.0)
MCH: 31.9 pg (ref 26.0–34.0)
MCHC: 34.6 g/dL (ref 30.0–36.0)
MCV: 92.2 fL (ref 78.0–100.0)
MONOS PCT: 7 %
Monocytes Absolute: 0.8 10*3/uL (ref 0.1–1.0)
NEUTROS ABS: 8.4 10*3/uL — AB (ref 1.7–7.7)
NEUTROS PCT: 81 %
Platelets: 130 10*3/uL — ABNORMAL LOW (ref 150–400)
RBC: 3.6 MIL/uL — ABNORMAL LOW (ref 4.22–5.81)
RDW: 14.3 % (ref 11.5–15.5)
WBC: 10.5 10*3/uL (ref 4.0–10.5)

## 2016-02-22 LAB — BASIC METABOLIC PANEL
ANION GAP: 9 (ref 5–15)
ANION GAP: 9 (ref 5–15)
BUN: 18 mg/dL (ref 6–20)
BUN: 19 mg/dL (ref 6–20)
CALCIUM: 7.9 mg/dL — AB (ref 8.9–10.3)
CO2: 21 mmol/L — ABNORMAL LOW (ref 22–32)
CO2: 21 mmol/L — ABNORMAL LOW (ref 22–32)
Calcium: 8.2 mg/dL — ABNORMAL LOW (ref 8.9–10.3)
Chloride: 106 mmol/L (ref 101–111)
Chloride: 107 mmol/L (ref 101–111)
Creatinine, Ser: 0.83 mg/dL (ref 0.61–1.24)
Creatinine, Ser: 1.08 mg/dL (ref 0.61–1.24)
GFR calc Af Amer: >60 mL/min (ref >60)
GFR calc Af Amer: >60 mL/min (ref >60)
Glucose, Bld: 106 mg/dL — ABNORMAL HIGH (ref 65–99)
Glucose, Bld: 97 mg/dL (ref 65–99)
POTASSIUM: 3.8 mmol/L (ref 3.5–5.1)
POTASSIUM: 4.8 mmol/L (ref 3.5–5.1)
SODIUM: 136 mmol/L (ref 135–145)
SODIUM: 137 mmol/L (ref 135–145)

## 2016-02-22 LAB — BLOOD CULTURE ID PANEL (REFLEXED)
ACINETOBACTER BAUMANNII: NOT DETECTED
CANDIDA ALBICANS: NOT DETECTED
CANDIDA KRUSEI: NOT DETECTED
CANDIDA PARAPSILOSIS: NOT DETECTED
CANDIDA TROPICALIS: NOT DETECTED
Candida glabrata: NOT DETECTED
ENTEROBACTERIACEAE SPECIES: NOT DETECTED
ESCHERICHIA COLI: NOT DETECTED
Enterobacter cloacae complex: NOT DETECTED
Enterococcus species: NOT DETECTED
HAEMOPHILUS INFLUENZAE: NOT DETECTED
KLEBSIELLA OXYTOCA: NOT DETECTED
KLEBSIELLA PNEUMONIAE: NOT DETECTED
Listeria monocytogenes: NOT DETECTED
Neisseria meningitidis: NOT DETECTED
Proteus species: NOT DETECTED
Pseudomonas aeruginosa: NOT DETECTED
SERRATIA MARCESCENS: NOT DETECTED
STREPTOCOCCUS SPECIES: DETECTED — AB
Staphylococcus aureus (BCID): NOT DETECTED
Staphylococcus species: NOT DETECTED
Streptococcus agalactiae: NOT DETECTED
Streptococcus pneumoniae: NOT DETECTED
Streptococcus pyogenes: NOT DETECTED

## 2016-02-22 LAB — TROPONIN I
Troponin I: 0.08 ng/mL (ref <0.03)
Troponin I: <0.03 ng/mL (ref <0.03)

## 2016-02-22 LAB — RESPIRATORY PANEL BY PCR
Adenovirus: NOT DETECTED
BORDETELLA PERTUSSIS-RVPCR: NOT DETECTED
CORONAVIRUS 229E-RVPPCR: NOT DETECTED
CORONAVIRUS OC43-RVPPCR: NOT DETECTED
Chlamydophila pneumoniae: NOT DETECTED
Coronavirus HKU1: NOT DETECTED
Coronavirus NL63: NOT DETECTED
INFLUENZA B-RVPPCR: NOT DETECTED
Influenza A: NOT DETECTED
METAPNEUMOVIRUS-RVPPCR: NOT DETECTED
Mycoplasma pneumoniae: NOT DETECTED
PARAINFLUENZA VIRUS 1-RVPPCR: NOT DETECTED
PARAINFLUENZA VIRUS 2-RVPPCR: NOT DETECTED
PARAINFLUENZA VIRUS 3-RVPPCR: NOT DETECTED
Parainfluenza Virus 4: NOT DETECTED
RESPIRATORY SYNCYTIAL VIRUS-RVPPCR: NOT DETECTED
RHINOVIRUS / ENTEROVIRUS - RVPPCR: NOT DETECTED

## 2016-02-22 MED ORDER — HALOPERIDOL LACTATE 5 MG/ML IJ SOLN: 2.0000 mg | Freq: Four times a day (QID) | INTRAMUSCULAR | Status: DC | PRN

## 2016-02-22 MED ORDER — DEXTROSE 5 % IV SOLN
2.0000 g | INTRAVENOUS | Status: DC
  Administered 2016-02-22 – 2016-02-25 (×4): 2 g via INTRAVENOUS
  Filled 2016-02-22 (×5): qty 2

## 2016-02-22 MED ORDER — LORAZEPAM 2 MG/ML IJ SOLN
1.0000 mg | Freq: Once | INTRAMUSCULAR | Status: AC
  Administered 2016-02-23: 1 mg via INTRAVENOUS
  Filled 2016-02-22: qty 1

## 2016-02-22 MED ORDER — LORAZEPAM 2 MG/ML IJ SOLN
1.0000 mg | Freq: Four times a day (QID) | INTRAMUSCULAR | Status: DC | PRN
  Filled 2016-02-22: qty 1

## 2016-02-22 MED ORDER — DILTIAZEM LOAD VIA INFUSION
10.0000 mg | Freq: Once | INTRAVENOUS | Status: AC
  Administered 2016-02-22: 10 mg via INTRAVENOUS
  Filled 2016-02-22: qty 10

## 2016-02-22 MED ORDER — SODIUM CHLORIDE 0.9 % IV SOLN
INTRAVENOUS | Status: DC
  Administered 2016-02-23 – 2016-02-24 (×2): via INTRAVENOUS

## 2016-02-22 MED ORDER — HEPARIN (PORCINE) IN NACL 100-0.45 UNIT/ML-% IJ SOLN
2400.0000 [IU]/h | INTRAMUSCULAR | Status: DC
  Administered 2016-02-22: 1300 [IU]/h via INTRAVENOUS
  Administered 2016-02-23: 1500 [IU]/h via INTRAVENOUS
  Administered 2016-02-24: 2300 [IU]/h via INTRAVENOUS
  Administered 2016-02-25 – 2016-02-27 (×5): 2400 [IU]/h via INTRAVENOUS
  Filled 2016-02-22 (×10): qty 250

## 2016-02-22 MED ORDER — METOPROLOL TARTRATE 5 MG/5ML IV SOLN
5.0000 mg | Freq: Four times a day (QID) | INTRAVENOUS | Status: DC
  Administered 2016-02-22 – 2016-02-23 (×4): 5 mg via INTRAVENOUS
  Filled 2016-02-22 (×3): qty 5

## 2016-02-22 MED ORDER — ENOXAPARIN SODIUM 40 MG/0.4ML ~~LOC~~ SOLN
40.0000 mg | SUBCUTANEOUS | Status: DC
  Administered 2016-02-22: 40 mg via SUBCUTANEOUS
  Filled 2016-02-22: qty 0.4

## 2016-02-22 MED ORDER — VANCOMYCIN HCL 10 G IV SOLR
1250.0000 mg | Freq: Two times a day (BID) | INTRAVENOUS | Status: DC
  Filled 2016-02-22: qty 1250

## 2016-02-22 MED ORDER — ALUM & MAG HYDROXIDE-SIMETH 200-200-20 MG/5ML PO SUSP
15.0000 mL | ORAL | Status: DC | PRN
Start: 2016-02-22 — End: 2016-02-28
  Administered 2016-02-22 – 2016-02-24 (×6): 30 mL via ORAL
  Filled 2016-02-22 (×5): qty 30

## 2016-02-22 MED ORDER — HEPARIN BOLUS VIA INFUSION
2000.0000 [IU] | Freq: Once | INTRAVENOUS | Status: AC
  Administered 2016-02-22: 2000 [IU] via INTRAVENOUS
  Filled 2016-02-22: qty 2000

## 2016-02-22 MED ORDER — ORAL CARE MOUTH RINSE
15.0000 mL | Freq: Two times a day (BID) | OROMUCOSAL | Status: DC
  Administered 2016-02-22 – 2016-02-28 (×9): 15 mL via OROMUCOSAL

## 2016-02-22 NOTE — Progress Notes (Signed)
.   Pharmacy Antibiotic Note - Vancomycin & [place] is a 71 y.o. male admitted on 02/21/2016 with sepsis, suspected source = respiratory per note.  Pharmacy has been consulted for vancomycin and Zosyn dosing. Today is D#2 ABX, originally started on Vancomycin 2 g load followed by 1,000 mg q8hours. Pt remains febrile, WBC without much change at 10.5. SCr with overall increase to 1.08 giving pt a normalized CrCl ~64. UOP at 0.9 ml/kg/hr.   Plan: Decrease vancomycin to 1250 mg q12h as renal function with slight worsening Zosyn 3.375g IV q8h (4 hour infusion).  Check vanc trough as appropriate F/U cultures, renal function, and further therapy plan.  Height: 6\' 1"  (185.4 cm) Weight: 222 lb 7.1 oz (100.9 kg) IBW/kg (Calculated) : 79.9  Temp (24hrs), Avg:100.4 F (38 C), Min:98.4 F (36.9 C), Max:102.9 F (39.4 C)   Recent Labs Lab 02/21/16 0612 02/21/16 0629 02/21/16 0633 02/21/16 1059 02/21/16 1744 02/21/16 2117 02/21/16 2118 02/21/16 2119 02/22/16 0120 02/22/16 0723 02/22/16 1054  WBC 11.0*  --   --   --   --   --  9.7  --   --   --  10.5  CREATININE 0.93  --  0.80  --  0.87  --   --  0.90 0.83 1.08  --   LATICACIDVEN  --  1.11  --  1.71  --  0.7  --   --   --   --   --     Estimated Creatinine Clearance: 78.4 mL/min (by C-G formula based on SCr of 1.08 mg/dL).    Allergies  Allergen Reactions  . Other Other (See Comments)    Pt reports allergic reaction to melons - unknown reaction  . Ciprofloxacin Hives    Antimicrobials this admission: Zosyn 2/2 >>  Vancomycin 2/2 >>  Tamiflu 2/2 >> 2/2  Dose adjustments this admission: 2/3 change vanc from 1000 g8h to 1250 q12h for renal function  Microbiology results: 2/2 BCx: ngtdx1d 2/2 UCx: insignificant growth 2/2 Influenza panel: negative 2/2 respiratory panel : negative 2/2 MRSA PCR negative   Thank you for allowing pharmacy to be a part of this patient's care.  Carlean Jews, Pharm.D. PGY1  Pharmacy Resident 2/3/201812:38 PM Pager [phone]

## 2016-02-22 NOTE — Progress Notes (Signed)
[place] TEAM 1 - [place]  [id number] DOB: Jan 30, 1945 DOA: 02/21/2016 PCP: Sinclair Grooms, MD    Brief Narrative:  71 y.o. male with history of CAD s/p angioplasty, HTN, hyperlipidemia, and arthritis who presented to the ED with altered mental status. Patient recently had increase in lower back pain. He visited a physician and was given a prescription for steroids and hydrocodone, which he said made him feel weird. His back pain did not improve and he sought medical attention again. On his return visit the patient was given Flexeril and Percocet in addition to his steroids.  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     In the ED the patient was found to be in SVT and was given adenosine which revealed A. fib with RVR. He was started on a diltiazem drip after which he spontaneously converted to normal sinus rhythm.  Subjective: Patient is alert and conversant but still remains somewhat confused.  His companion confirms that he is not yet back in his mental status baseline.  The patient reports ongoing low back pain but denies any change in its character or severity.  He denies lower stream a paralysis or numbness and demonstrates that he can move both of his legs and his feet.  He denies chest pain nausea vomiting or abdominal pain.  Assessment & Plan:  Sepsis of unclear source - gram + cocci bacteremia  Tmax of 102.9 w/ tachcyardia - follow blood cx speciation and sensitivities - urine culture not revealing - CXR w/o evidence of infiltrate - flu/resp virus negative - there is some concern about a possible epidural abscess and the pt will need f/u MRI imaging once he is more stable medically and able to cooperate - no exam findings to suggest cord compression at this time - educated pt and companion on need to alert staff STAT should he develop paralysis or anesthesia  of LE   Newly diagnosed Afib w/ RVR > sinus tachycardia  TSH is normal - rate control improved - CHA2DS2-VASc is at least 2 - will anticoagulate for w/ hepain gtt if confirmed afib recurrs - add BB - wean cardizem as able   CAD Remote inferior MI - asymptomatic at this time   HTN BP stable at present   HLD Cont lipitor  DVT prophylaxis: lovenox  Code Status: FULL CODE Family Communication: spoke w/ companion Molly at bedside at [employer]   Disposition Plan: SDU  Consultants:  [facility] Orthoped[facility]  Procedures: none  Antimicrobials:  Zosyn 2/2 > Vanc 2/2 >  Objective: Blood pressure 115/73, pulse (!) 104, temperature (!) 101.3 F (38.5 C), temperature source Rectal, resp. rate (!) 31, height 6\' 1"  (1.854 m), weight 100.9 kg (222 lb 7.1 oz), SpO2 95 %.  Intake/Output Summary (Last 24 hours) at 02/22/16 1504 Last data filed at 02/22/16 1351  Gross per 24 hour  Intake          3515.79 ml  Output             1950 ml  Net          1565.79 ml   Filed Weights   02/21/16 0556 02/21/16 2026 02/22/16 0300  Weight: 95.3 kg (210 lb) 99.9 kg (220 lb 3.8 oz) 100.9 kg (222 lb 7.1 oz)    Examination: General: No acute respiratory distress - alert but  confused  Lungs: Clear to auscultation bilaterally without wheezes or crackles Cardiovascular: tachycardic but regular - no M or gallup Abdomen: Nontender, nondistended, soft, bowel sounds positive, no rebound, no ascites, no appreciable mass Extremities: No significant cyanosis, clubbing, or edema bilateral lower extremities  CBC:  Recent Labs Lab 02/21/16 0612 02/21/16 0633 02/21/16 2118 02/22/16 1054  WBC 11.0*  --  9.7 10.5  NEUTROABS 8.6*  --  7.5 8.4*  HGB 13.7 13.3 12.2* 11.5*  HCT 38.9* 39.0 35.2* 33.2*  MCV 91.5  --  92.6 92.2  PLT 136*  --  125* [id number]*   Basic Metabolic Panel:  Recent Labs Lab 02/21/16 0612 02/21/16 0633 02/21/16 1744 02/21/16 1836 02/21/16 2119 02/22/16 0120 02/22/16 0723  NA 129* 131* 134*   --  134* 136 137  K 3.9 3.9 3.9  --  3.8 3.8 4.8  CL 94* 98* 103  --  102 106 107  CO2 21*  --  21*  --  20* 21* 21*  GLUCOSE 115* 121* 101*  --  113* 106* 97  BUN 25* 26* 19  --  20 18 19   CREATININE 0.93 0.80 0.87  --  0.90 0.83 1.08  CALCIUM 9.7  --  8.5*  --  8.3* 8.2* 7.9*  MG  --   --   --  1.8  --   --   --   PHOS  --   --   --  3.0  --   --   --    GFR: Estimated Creatinine Clearance: 78.4 mL/min (by C-G formula based on SCr of 1.08 mg/dL).  Liver Function Tests:  Recent Labs Lab 02/21/16 0612  AST 30  ALT 26  ALKPHOS 58  BILITOT 1.0  PROT 6.4*  ALBUMIN 3.0*    Recent Labs Lab 02/21/16 1836  LIPASE 16    Coagulation Profile:  Recent Labs Lab 02/21/16 0647  INR 1.26    Cardiac Enzymes:  Recent Labs Lab 02/21/16 1836 02/21/16 2118 02/21/16 2332 02/22/16 0723  TROPONINI 0.06* 0.03* 0.08* <0.03    Recent Results (from the past 240 hour(s))  Blood Culture (routine x 2)     Status: None (Preliminary result)   Collection Time: 02/21/16  6:09 AM  Result Value Ref Range Status   Specimen Description BLOOD RIGHT ARM  Final   Special Requests BOTTLES DRAWN AEROBIC AND ANAEROBIC 5ML  Final   Culture  Setup Time   Final    GRAM POSITIVE COCCI IN CHAINS ANAEROBIC BOTTLE ONLY Organism ID to follow    Culture NO GROWTH 1 DAY  Final   Report Status PENDING  Incomplete  Blood Culture (routine x 2)     Status: None (Preliminary result)   Collection Time: 02/21/16  6:17 AM  Result Value Ref Range Status   Specimen Description BLOOD LEFT ARM  Final   Special Requests BOTTLES DRAWN AEROBIC AND ANAEROBIC 5ML  Final   Culture  Setup Time   Final    GRAM POSITIVE COCCI IN CHAINS ANAEROBIC BOTTLE ONLY    Culture GRAM POSITIVE COCCI  Final   Report Status PENDING  Incomplete  Urine culture     Status: Abnormal   Collection Time: 02/21/16  6:26 AM  Result Value Ref Range Status   Specimen Description URINE, RANDOM  Final   Special Requests NONE  Final    Culture <10,000 COLONIES/mL INSIGNIFICANT GROWTH (A)  Final   Report Status 02/22/2016 FINAL  Final  Respiratory Panel by PCR  Status: None   Collection Time: 02/21/16  1:20 PM  Result Value Ref Range Status   Adenovirus NOT DETECTED NOT DETECTED Final   Coronavirus 229E NOT DETECTED NOT DETECTED Final   Coronavirus HKU1 NOT DETECTED NOT DETECTED Final   Coronavirus NL63 NOT DETECTED NOT DETECTED Final   Coronavirus OC43 NOT DETECTED NOT DETECTED Final   Metapneumovirus NOT DETECTED NOT DETECTED Final   Rhinovirus / Enterovirus NOT DETECTED NOT DETECTED Final   Influenza A NOT DETECTED NOT DETECTED Final   Influenza B NOT DETECTED NOT DETECTED Final   Parainfluenza Virus 1 NOT DETECTED NOT DETECTED Final   Parainfluenza Virus 2 NOT DETECTED NOT DETECTED Final   Parainfluenza Virus 3 NOT DETECTED NOT DETECTED Final   Parainfluenza Virus 4 NOT DETECTED NOT DETECTED Final   Respiratory Syncytial Virus NOT DETECTED NOT DETECTED Final   Bordetella pertussis NOT DETECTED NOT DETECTED Final   Chlamydophila pneumoniae NOT DETECTED NOT DETECTED Final   Mycoplasma pneumoniae NOT DETECTED NOT DETECTED Final  MRSA PCR Screening     Status: None   Collection Time: 02/21/16  8:43 PM  Result Value Ref Range Status   MRSA by PCR NEGATIVE NEGATIVE Final    Comment:        The GeneXpert MRSA Assay (FDA approved for NASAL specimens only), is one component of a comprehensive MRSA colonization surveillance program. It is not intended to diagnose MRSA infection nor to guide or monitor treatment for MRSA infections.      Scheduled Meds: . atorvastatin  40 mg Oral Daily  . docusate  100 mg Oral BID  . folic acid  1 mg Oral Daily  . hydrochlorothiazide  12.5 mg Oral Daily  . lidocaine  2 patch Transdermal Q24H  . lisinopril  20 mg Oral Daily  . mouth rinse  15 mL Mouth Rinse BID  . metoprolol  5 mg Intravenous Q6H  . multivitamin with minerals  1 tablet Oral Daily  .  piperacillin-tazobactam (ZOSYN)  IV  3.375 g Intravenous Q8H  . thiamine  100 mg Oral Daily  . vancomycin  1,250 mg Intravenous Q12H   Continuous Infusions: . diltiazem (CARDIZEM) infusion 12.5 mg/hr (02/22/16 0412)     LOS: 1 day   Cherene Altes, MD [facility] Office  [phone] Pager - Text Page per Amion as per below:  On-Call/Text Page:      [email or web address]      password [email or web address]  If 7PM-7AM, please contact night-coverage [email or web address] Password [email or web address] 02/22/2016, 3:04 PM

## 2016-02-22 NOTE — Progress Notes (Signed)
Pt is established with Dr. Gladstone Lighter for his back pain. Will sign-off.

## 2016-02-22 NOTE — Progress Notes (Addendum)
Patient arrived to 4E03 from ED via stretcher at 2030 on 02/21/16. Personal pillow is only belonging present. Patient is sedated due to medications given in ED (Ativan and Haldol).  Arouses to stimulation. Patient transferred to stepdown bed and placed on monitors.  Patient is in A-fib with RVR with HR 140-150s.  RN gave PRN dose of 5mg  metoprolol IV which brought HR down to 120-130s. MD notified, orders obtained for 542mL NS bolus and an additional 2.5mg  IV metoprolol. Labs (lactic acid, TSH, CBC, and troponin) were also drawn at this time.  ABG was done. Patient continued to be 120-150s and Cardizem gtt was started.  Pt converted to ST at [place]. MD notified and verbal orders obtained for patient to remain on gtt for a few hours due to HR still being 100-110s.  Will continue to monitor.

## 2016-02-22 NOTE — Progress Notes (Signed)
  PHARMACY - PHYSICIAN COMMUNICATION CRITICAL VALUE ALERT - BLOOD CULTURE IDENTIFICATION (BCID)  Results for orders placed or performed during the hospital encounter of 02/21/16  Blood Culture ID Panel (Reflexed) (Collected: 02/21/2016  6:09 AM)  Result Value Ref Range   Enterococcus species NOT DETECTED NOT DETECTED   Listeria monocytogenes NOT DETECTED NOT DETECTED   Staphylococcus species NOT DETECTED NOT DETECTED   Staphylococcus aureus NOT DETECTED NOT DETECTED   Streptococcus species DETECTED (A) NOT DETECTED   Streptococcus agalactiae NOT DETECTED NOT DETECTED   Streptococcus pneumoniae NOT DETECTED NOT DETECTED   Streptococcus pyogenes NOT DETECTED NOT DETECTED   Acinetobacter baumannii NOT DETECTED NOT DETECTED   Enterobacteriaceae species NOT DETECTED NOT DETECTED   Enterobacter cloacae complex NOT DETECTED NOT DETECTED   Escherichia coli NOT DETECTED NOT DETECTED   Klebsiella oxytoca NOT DETECTED NOT DETECTED   Klebsiella pneumoniae NOT DETECTED NOT DETECTED   Proteus species NOT DETECTED NOT DETECTED   Serratia marcescens NOT DETECTED NOT DETECTED   Haemophilus influenzae NOT DETECTED NOT DETECTED   Neisseria meningitidis NOT DETECTED NOT DETECTED   Pseudomonas aeruginosa NOT DETECTED NOT DETECTED   Candida albicans NOT DETECTED NOT DETECTED   Candida glabrata NOT DETECTED NOT DETECTED   Candida krusei NOT DETECTED NOT DETECTED   Candida parapsilosis NOT DETECTED NOT DETECTED   Candida tropicalis NOT DETECTED NOT DETECTED    Name of physician (or Provider) Contacted: Dr. Thereasa Solo  Changes to prescribed antibiotics required: Discontinue Vancomycin and Zosyn and change to Rocephin 2g IV every 24 hours  Lawson Radar 02/22/2016  4:03 PM

## 2016-02-22 NOTE — Telephone Encounter (Signed)
Noted.  He is in hospital and it is non cardiac related.

## 2016-02-22 NOTE — Progress Notes (Signed)
ANTICOAGULATION CONSULT NOTE - Initial Consult  Pharmacy Consult for Heparin  Indication: atrial fibrillation  Allergies  Allergen Reactions  . Other Other (See Comments)    Pt reports allergic reaction to melons - unknown reaction  . Ciprofloxacin Hives    Patient Measurements: Height: 6\' 1"  (185.4 cm) Weight: 222 lb 7.1 oz (100.9 kg) IBW/kg (Calculated) : 79.9 Heparin Dosing Weight: 100 kg  Vital Signs: BP: 115/73 (02/03 1300) Pulse Rate: 104 (02/03 1300)  Labs:  Recent Labs  02/21/16 0612 02/21/16 [id number] 02/21/16 0647  02/21/16 2118 02/21/16 2119 02/21/16 2332 02/22/16 0120 02/22/16 0723 02/22/16 1054  HGB 13.7 13.3  --   --  12.2*  --   --   --   --  11.5*  HCT 38.9* 39.0  --   --  35.2*  --   --   --   --  33.2*  PLT 136*  --   --   --  125*  --   --   --   --  130*  LABPROT  --   --  15.8*  --   --   --   --   --   --   --   INR  --   --  1.26  --   --   --   --   --   --   --   CREATININE 0.93 0.80  --   < >  --  0.90  --  0.83 1.08  --   TROPONINI  --   --   --   < > 0.03*  --  0.08*  --  <0.03  --   < > = values in this interval not displayed.  Estimated Creatinine Clearance: 78.4 mL/min (by C-G formula based on SCr of 1.08 mg/dL).   Medical History: Past Medical History:  Diagnosis Date  . Arthritis HANDS AND RIGHT SHOULDER  . Coronary artery disease CARDIOLOGIST- DR Daneen Schick- LAST VISIT 6 MON AGO--  REQUESTED NOTE, EKG, ECHO, STRESS TEST   PT DENIES S & S  . H/O gastric ulcer   . History of MI (myocardial infarction) 1992  . Hyperlipidemia   . Hypertension   . Loose body of right shoulder   . S/P coronary angioplasty     Medications:  Prescriptions Prior to Admission  Medication Sig Dispense Refill Last Dose  . Alirocumab (PRALUENT) 150 MG/ML SOPN Inject 150 mg into the skin every 14 (fourteen) days. 2 pen 11 within past 2 weeks  . atorvastatin (LIPITOR) 80 MG tablet Take 40 mg by mouth daily.   02/20/2016 at Unknown time  . Coenzyme Q10  (COQ10 PO) Take 1 capsule by mouth daily.   02/20/2016 at Unknown time  . cyclobenzaprine (FLEXERIL) 10 MG tablet Take 10 mg by mouth 2 (two) times daily.    02/20/2016 at pm  . GLUCOSAMINE-CHONDROITIN PO Take 2 tablets by mouth daily.   02/20/2016 at Unknown time  . lisinopril-hydrochlorothiazide (PRINZIDE,ZESTORETIC) 20-12.5 MG tablet Take 1 tablet by mouth daily. Patient is overdue for an appointment. Please call and schedule for further refills 90 tablet 0 02/20/2016 at Unknown time  . Melatonin 10 MG TABS Take 10-20 mg by mouth at bedtime as needed (sleep).    02/20/2016 at Unknown time  . methylPREDNISolone (MEDROL DOSEPAK) 4 MG TBPK tablet Take as directed on packet (Patient taking differently: Take 4 mg by mouth See admin instructions. Take as directed on package - tapered course) 21 tablet  0 02/20/2016 at Unknown time  . Multiple Vitamin (MULTIVITAMIN WITH MINERALS) TABS tablet Take 1 tablet by mouth daily. Centrum   02/20/2016 at Unknown time  . mupirocin ointment (BACTROBAN) 2 % Apply 1 application topically 2 (two) times daily as needed (wound care/ eczema).   unknown  . naloxegol oxalate (MOVANTIK) 25 MG TABS tablet Take 25 mg by mouth 2 (two) times daily.   02/20/2016 at Unknown time  . naproxen sodium (ALEVE) 220 MG tablet Take 660 mg by mouth daily as needed (pain).   week agl  . Omega-3 Fatty Acids (FISH OIL) 1000 MG CAPS Take 1,000 mg by mouth daily.   02/20/2016 at Unknown time  . oxyCODONE-acetaminophen (PERCOCET) 10-325 MG tablet Take 1 tablet by mouth every 4 (four) hours as needed for pain.    02/20/2016 at pm  . prednisoLONE acetate (PRED FORTE) 1 % ophthalmic suspension Place 1 drop into the left eye 3 (three) times daily.   02/20/2016 at Unknown time  . HYDROcodone-acetaminophen (NORCO/VICODIN) 5-325 MG tablet Take 1-2 tablets by mouth every 6 (six) hours as needed. (Patient not taking: Reported on 02/21/2016) 10 tablet 0 Not Taking at Unknown time  . metoprolol succinate (TOPROL XL) 25 MG 24 hr  tablet Take 1 tablet (25 mg total) by mouth daily. (Patient not taking: Reported on 02/21/2016) 90 tablet 1 Not Taking at Unknown time   Scheduled:  . atorvastatin  40 mg Oral Daily  . cefTRIAXone (ROCEPHIN)  IV  2 g Intravenous Q24H  . docusate  100 mg Oral BID  . folic acid  1 mg Oral Daily  . lidocaine  2 patch Transdermal Q24H  . lisinopril  20 mg Oral Daily  . mouth rinse  15 mL Mouth Rinse BID  . metoprolol  5 mg Intravenous Q6H  . multivitamin with minerals  1 tablet Oral Daily  . thiamine  100 mg Oral Daily    Assessment: 71 y.o male admmitted 02/21/16 with sepsis.  Newly diagnosed with Afib w/RVR>sinus tachycardia.- CHA2DS2-VASc is at least 2  Pharmacy consulted to start IV heparin for Afib.  Lovenox 40 mg SQ given today at 15:51- now discontinued.  pltc 130k, Hgb 11.5   Goal of Therapy:  Heparin level 0.3-0.7 units/ml Monitor platelets by anticoagulation protocol: Yes   Plan:  Heparin bolus 2000 units IV x1 (reduced due to lovenox 40mg  given) Start IV Heparin drip 1300 units/hr. Heparin level 8hr after bolus  Daily heparin level and CBC   Nicole Cella, RPh Clinical Pharmacist Pager: [phone] 02/22/2016,6:33 PM

## 2016-02-23 ENCOUNTER — Inpatient Hospital Stay (HOSPITAL_COMMUNITY): Payer: Medicare Other

## 2016-02-23 ENCOUNTER — Inpatient Hospital Stay: Payer: Self-pay

## 2016-02-23 DIAGNOSIS — Z955 Presence of coronary angioplasty implant and graft: Secondary | ICD-10-CM

## 2016-02-23 DIAGNOSIS — Z87891 Personal history of nicotine dependence: Secondary | ICD-10-CM

## 2016-02-23 DIAGNOSIS — Z825 Family history of asthma and other chronic lower respiratory diseases: Secondary | ICD-10-CM

## 2016-02-23 DIAGNOSIS — M5126 Other intervertebral disc displacement, lumbar region: Secondary | ICD-10-CM

## 2016-02-23 DIAGNOSIS — Z881 Allergy status to other antibiotic agents status: Secondary | ICD-10-CM

## 2016-02-23 DIAGNOSIS — Z8249 Family history of ischemic heart disease and other diseases of the circulatory system: Secondary | ICD-10-CM

## 2016-02-23 DIAGNOSIS — Z91018 Allergy to other foods: Secondary | ICD-10-CM

## 2016-02-23 LAB — CBC
HEMATOCRIT: 34.6 % — AB (ref 39.0–52.0)
HEMOGLOBIN: 12 g/dL — AB (ref 13.0–17.0)
MCH: 31.9 pg (ref 26.0–34.0)
MCHC: 34.7 g/dL (ref 30.0–36.0)
MCV: 92 fL (ref 78.0–100.0)
Platelets: 141 10*3/uL — ABNORMAL LOW (ref 150–400)
RBC: 3.76 MIL/uL — ABNORMAL LOW (ref 4.22–5.81)
RDW: 14.6 % (ref 11.5–15.5)
WBC: 14.3 10*3/uL — ABNORMAL HIGH (ref 4.0–10.5)

## 2016-02-23 LAB — FOLATE: Folate: 17.4 ng/mL (ref >5.9)

## 2016-02-23 LAB — COMPREHENSIVE METABOLIC PANEL
ALBUMIN: 2.2 g/dL — AB (ref 3.5–5.0)
ALK PHOS: 50 U/L (ref 38–126)
ALT: 28 U/L (ref 17–63)
ANION GAP: 11 (ref 5–15)
AST: 35 U/L (ref 15–41)
BILIRUBIN TOTAL: 1 mg/dL (ref 0.3–1.2)
BUN: 17 mg/dL (ref 6–20)
CALCIUM: 8 mg/dL — AB (ref 8.9–10.3)
CO2: 20 mmol/L — ABNORMAL LOW (ref 22–32)
Chloride: 103 mmol/L (ref 101–111)
Creatinine, Ser: 0.87 mg/dL (ref 0.61–1.24)
GLUCOSE: 124 mg/dL — AB (ref 65–99)
POTASSIUM: 3 mmol/L — AB (ref 3.5–5.1)
Sodium: 134 mmol/L — ABNORMAL LOW (ref 135–145)
Total Protein: 5.5 g/dL — ABNORMAL LOW (ref 6.5–8.1)

## 2016-02-23 LAB — C DIFFICILE QUICK SCREEN W PCR REFLEX
C DIFFICILE (CDIFF) INTERP: NOT DETECTED
C Diff antigen: NEGATIVE
C Diff toxin: NEGATIVE

## 2016-02-23 LAB — HEPARIN LEVEL (UNFRACTIONATED)
HEPARIN UNFRACTIONATED: 0.12 [IU]/mL — AB (ref 0.30–0.70)
HEPARIN UNFRACTIONATED: 0.19 [IU]/mL — AB (ref 0.30–0.70)
Heparin Unfractionated: <0.1 IU/mL — ABNORMAL LOW (ref 0.30–0.70)

## 2016-02-23 LAB — VITAMIN B12: Vitamin B-12: 591 pg/mL (ref 180–914)

## 2016-02-23 LAB — LACTOFERRIN, FECAL, QUALITATIVE: LACTOFERRIN, FECAL, QUAL: NEGATIVE

## 2016-02-23 MED ORDER — PANTOPRAZOLE SODIUM 40 MG PO TBEC
40.0000 mg | DELAYED_RELEASE_TABLET | Freq: Every day | ORAL | Status: DC
  Administered 2016-02-23 – 2016-02-27 (×5): 40 mg via ORAL
  Filled 2016-02-23 (×6): qty 1

## 2016-02-23 MED ORDER — METOPROLOL TARTRATE 5 MG/5ML IV SOLN
10.0000 mg | Freq: Four times a day (QID) | INTRAVENOUS | Status: DC
  Administered 2016-02-23 – 2016-02-24 (×4): 10 mg via INTRAVENOUS
  Filled 2016-02-23 (×6): qty 10

## 2016-02-23 MED ORDER — HEPARIN BOLUS VIA INFUSION
1000.0000 [IU] | Freq: Once | INTRAVENOUS | Status: AC
  Administered 2016-02-23: 1000 [IU] via INTRAVENOUS
  Filled 2016-02-23: qty 1000

## 2016-02-23 MED ORDER — GI COCKTAIL ~~LOC~~
30.0000 mL | Freq: Three times a day (TID) | ORAL | Status: DC | PRN
  Administered 2016-02-23 – 2016-02-25 (×4): 30 mL via ORAL
  Filled 2016-02-23 (×4): qty 30

## 2016-02-23 MED ORDER — POTASSIUM CHLORIDE CRYS ER 20 MEQ PO TBCR
40.0000 meq | EXTENDED_RELEASE_TABLET | Freq: Three times a day (TID) | ORAL | Status: AC
  Administered 2016-02-23 – 2016-02-24 (×5): 40 meq via ORAL
  Filled 2016-02-23 (×5): qty 2

## 2016-02-23 MED ORDER — MELATONIN 3 MG PO TABS
3.0000 mg | ORAL_TABLET | Freq: Every evening | ORAL | Status: DC | PRN
  Filled 2016-02-23: qty 1

## 2016-02-23 MED ORDER — MELATONIN 3 MG PO TABS
18.0000 mg | ORAL_TABLET | Freq: Every evening | ORAL | Status: DC | PRN
  Filled 2016-02-23: qty 6

## 2016-02-23 MED ORDER — HALOPERIDOL LACTATE 5 MG/ML IJ SOLN: 2.0000 mg | Freq: Four times a day (QID) | INTRAMUSCULAR | Status: DC | PRN

## 2016-02-23 MED ORDER — HEPARIN BOLUS VIA INFUSION
2000.0000 [IU] | Freq: Once | INTRAVENOUS | Status: AC
  Administered 2016-02-23: 2000 [IU] via INTRAVENOUS
  Filled 2016-02-23: qty 2000

## 2016-02-23 MED ORDER — MELATONIN 3 MG PO TABS
9.0000 mg | ORAL_TABLET | Freq: Every evening | ORAL | Status: DC | PRN
  Administered 2016-02-23: 18 mg via ORAL
  Filled 2016-02-23: qty 6

## 2016-02-23 MED ORDER — HEPARIN BOLUS VIA INFUSION
3000.0000 [IU] | Freq: Once | INTRAVENOUS | Status: AC
  Administered 2016-02-23: 3000 [IU] via INTRAVENOUS
  Filled 2016-02-23: qty 3000

## 2016-02-23 NOTE — Progress Notes (Signed)
Pt had bigeminy again, Dr Thereasa Solo notified by Pamala Hurry RN, no new orders received. Consuelo Pandy RN

## 2016-02-23 NOTE — Progress Notes (Addendum)
Pt's girlfriend, Cloyde Reams, brought in pt's MRI film taken of his back at [facility] by Dr Gladstone Lighter. She states Dr Gladstone Lighter told her to bring them in so MD could see them. I placed them in pt's chart that is at the nurses station. They are in a small brown envelope with a pt sticker on the front. Also girlfriend brought in pt's Praluent Alirocumab 150mg /ml and states that pt takes it every two weeks per Dr Tamala Julian, cardiology. I instructed her to take it back home and I sent a text to Dr Thereasa Solo and made a sticky note for MD. Consuelo Pandy RN

## 2016-02-23 NOTE — Consult Note (Signed)
[facility]  Date of Admission:  02/21/2016  Date of Consult:  02/23/2016  Reason for Consult: Diskitis vs abscess Referring Physician: Gladstone Lighter  Impression/Recommendation Discitis vs Abscess  Streptococcal bacteremia  Would await his repeat MRI Check repeat BCx in AM Neurosurgical f/u Consider cardiac echo   Thank you so much for this interesting consult,   Bobby Rumpf (pager) [phone] [email or web address]  Jonathan Holden is an 71 y.o. male.  HPI: 71 yo M with hx of CAD/PTCA, hyperlipidemia, previous laminotomy L4-5 and L5-S1, and worsening back pain. He was seen by Dr Linden Dolin 2-2 and had MRI as outpt- herniated L3-4 and previous surgical changes. He became confused and came to ED on 2-2. He was noted to have fever 102. He was started on zosyn/vanco. His ED course was also complicated by a fib with RVR.   He is now found to have streptococcal bacteremia.      Past Medical History:  Diagnosis Date  . Arthritis HANDS AND RIGHT SHOULDER  . Coronary artery disease CARDIOLOGIST- DR Daneen Schick- LAST VISIT 6 MON AGO--  REQUESTED NOTE, EKG, ECHO, STRESS TEST   PT DENIES S & S  . H/O gastric ulcer   . History of MI (myocardial infarction) 1992  . Hyperlipidemia   . Hypertension   . Loose body of right shoulder   . S/P coronary angioplasty     Past Surgical History:  Procedure Laterality Date  . CARDIOVASCULAR STRESS TEST  08-22-2008---  DR Daneen Schick   NO ISCHEMIA.   Marland Kitchen CORONARY ANGIOPLASTY  1992  . ERCP W/ SPHICTEROTOMY  12-15-2002   AND STONE EXTRACTION  . LAPAROSCOPIC CHOLECYSTECTOMY  12-16-2002  . LUMBAR LAMINECTOMY  20 YRS AGO   L5     Allergies  Allergen Reactions  . Other Other (See Comments)    Pt reports allergic reaction to melons - unknown reaction  . Ciprofloxacin Hives    Medications:  Scheduled: . atorvastatin  40 mg Oral Daily  . cefTRIAXone (ROCEPHIN)  IV  2 g Intravenous Q24H  . folic acid  1 mg  Oral Daily  . lidocaine  2 patch Transdermal Q24H  . lisinopril  20 mg Oral Daily  . mouth rinse  15 mL Mouth Rinse BID  . metoprolol  10 mg Intravenous Q6H  . multivitamin with minerals  1 tablet Oral Daily  . pantoprazole  40 mg Oral Q1200  . potassium chloride  40 mEq Oral TID  . thiamine  100 mg Oral Daily    Abtx:  Anti-infectives    Start     Dose/Rate Route Frequency Ordered Stop   02/22/16 2130  vancomycin (VANCOCIN) 1,250 mg in sodium chloride 0.9 % 250 mL IVPB  Status:  Discontinued     1,250 mg 166.7 mL/hr over 90 Minutes Intravenous Every 12 hours 02/22/16 1229 02/22/16 1626   02/22/16 1800  cefTRIAXone (ROCEPHIN) 2 g in dextrose 5 % 50 mL IVPB     2 g 100 mL/hr over 30 Minutes Intravenous Every 24 hours 02/22/16 1626     02/21/16 2200  oseltamivir (TAMIFLU) capsule 75 mg  Status:  Discontinued     75 mg Oral 2 times daily 02/21/16 1516 02/22/16 1407   02/21/16 1800  vancomycin (VANCOCIN) IVPB 1000 mg/200 mL premix  Status:  Discontinued     1,000 mg 200 mL/hr over 60 Minutes Intravenous Every 8 hours 02/21/16 1138 02/22/16 1229   02/21/16 1800  piperacillin-tazobactam (ZOSYN) IVPB  3.375 g  Status:  Discontinued     3.375 g 12.5 mL/hr over 240 Minutes Intravenous Every 8 hours 02/21/16 1138 02/22/16 1626   02/21/16 1015  piperacillin-tazobactam (ZOSYN) IVPB 3.375 g     3.375 g 100 mL/hr over 30 Minutes Intravenous  Once 02/21/16 1002 02/21/16 1100   02/21/16 1015  vancomycin (VANCOCIN) IVPB 1000 mg/200 mL premix     1,000 mg 200 mL/hr over 60 Minutes Intravenous  Once 02/21/16 1002 02/21/16 1132   02/21/16 0730  oseltamivir (TAMIFLU) capsule 75 mg     75 mg Oral  Once 02/21/16 0721 02/21/16 0802      Total days of antibiotics: 2 ceftriaxone          Social History:  reports that he quit smoking about 25 years ago. His smoking use included Cigarettes. He has a 40.00 pack-year smoking history. He has never used smokeless tobacco. He reports that he drinks about 4.2  oz of alcohol per week . He reports that he does not use drugs.  Family History  Problem Relation Age of Onset  . Other Mother     AGE 3 HEALTHY  . Heart attack Father 18    2 MI  . Emphysema    . Other Brother     HEALTHY    General ROS: +reflux. +loose BM, normal urine. denies F/C. no cough.  Please see HPI. 12 point ROS o/w (-)  Blood pressure 138/81, pulse (!) 106, temperature 98.5 F (36.9 C), temperature source Axillary, resp. rate (!) 27, height 6' 1"  (1.854 m), weight 101.6 kg (223 lb 15.8 oz), SpO2 97 %. General appearance: alert, cooperative and no distress Eyes: negative findings: conjunctivae and sclerae normal and pupils equal, round, reactive to light and accomodation Throat: normal findings: oropharynx pink & moist without lesions or evidence of thrush Neck: no adenopathy and supple, symmetrical, trachea midline Lungs: clear to auscultation bilaterally Heart: regular rate and rhythm Abdomen: normal findings: soft, non-tender and increased BS Extremities: edema none Neurologic: Mental status: Alert, oriented, thought content appropriate, orientation: date, person, place   Results for orders placed or performed during the hospital encounter of 02/21/16 (from the past 48 hour(s))  Basic metabolic panel     Status: Abnormal   Collection Time: 02/21/16  5:44 PM  Result Value Ref Range   Sodium 134 (L) 135 - 145 mmol/L   Potassium 3.9 3.5 - 5.1 mmol/L   Chloride 103 101 - 111 mmol/L   CO2 21 (L) 22 - 32 mmol/L   Glucose, Bld 101 (H) 65 - 99 mg/dL   BUN 19 6 - 20 mg/dL   Creatinine, Ser 0.87 0.61 - 1.24 mg/dL   Calcium 8.5 (L) 8.9 - 10.3 mg/dL   GFR calc non Af Amer >60 >60 mL/min   GFR calc Af Amer >60 >60 mL/min    Comment: (NOTE) The eGFR has been calculated using the CKD EPI equation. This calculation has not been validated in all clinical situations. eGFR's persistently <60 mL/min signify possible Chronic Kidney Disease.    Anion gap 10 5 - 15    Magnesium     Status: None   Collection Time: 02/21/16  6:36 PM  Result Value Ref Range   Magnesium 1.8 1.7 - 2.4 mg/dL  Phosphorus     Status: None   Collection Time: 02/21/16  6:36 PM  Result Value Ref Range   Phosphorus 3.0 2.5 - 4.6 mg/dL  Troponin I     Status: Abnormal  Collection Time: 02/21/16  6:36 PM  Result Value Ref Range   Troponin I 0.06 (HH) <0.03 ng/mL    Comment: CRITICAL RESULT CALLED TO, READ BACK BY AND VERIFIED WITH: C. Joylene Draft RN [phone] GREEN R   Lipase, blood     Status: None   Collection Time: 02/21/16  6:36 PM  Result Value Ref Range   Lipase 16 11 - 51 U/L  MRSA PCR Screening     Status: None   Collection Time: 02/21/16  8:43 PM  Result Value Ref Range   MRSA by PCR NEGATIVE NEGATIVE    Comment:        The GeneXpert MRSA Assay (FDA approved for NASAL specimens only), is one component of a comprehensive MRSA colonization surveillance program. It is not intended to diagnose MRSA infection nor to guide or monitor treatment for MRSA infections.   Lactic acid, plasma     Status: None   Collection Time: 02/21/16  9:17 PM  Result Value Ref Range   Lactic Acid, Venous 0.7 0.5 - 1.9 mmol/L  CBC with Differential/Platelet     Status: Abnormal   Collection Time: 02/21/16  9:18 PM  Result Value Ref Range   WBC 9.7 4.0 - 10.5 K/uL   RBC 3.80 (L) 4.22 - 5.81 MIL/uL   Hemoglobin 12.2 (L) 13.0 - 17.0 g/dL   HCT 35.2 (L) 39.0 - 52.0 %   MCV 92.6 78.0 - 100.0 fL   MCH 32.1 26.0 - 34.0 pg   MCHC 34.7 30.0 - 36.0 g/dL   RDW 14.2 11.5 - 15.5 %   Platelets 125 (L) 150 - 400 K/uL   Neutrophils Relative % 78 %   Neutro Abs 7.5 1.7 - 7.7 K/uL   Lymphocytes Relative 14 %   Lymphs Abs 1.3 0.7 - 4.0 K/uL   Monocytes Relative 8 %   Monocytes Absolute 0.8 0.1 - 1.0 K/uL   Eosinophils Relative 0 %   Eosinophils Absolute 0.0 0.0 - 0.7 K/uL   Basophils Relative 0 %   Basophils Absolute 0.0 0.0 - 0.1 K/uL  Troponin I     Status: Abnormal   Collection Time:  02/21/16  9:18 PM  Result Value Ref Range   Troponin I 0.03 (HH) <0.03 ng/mL    Comment: CRITICAL VALUE NOTED.  VALUE IS CONSISTENT WITH PREVIOUSLY REPORTED AND CALLED VALUE.  Basic metabolic panel     Status: Abnormal   Collection Time: 02/21/16  9:19 PM  Result Value Ref Range   Sodium 134 (L) 135 - 145 mmol/L   Potassium 3.8 3.5 - 5.1 mmol/L   Chloride 102 101 - 111 mmol/L   CO2 20 (L) 22 - 32 mmol/L   Glucose, Bld 113 (H) 65 - 99 mg/dL   BUN 20 6 - 20 mg/dL   Creatinine, Ser 0.90 0.61 - 1.24 mg/dL   Calcium 8.3 (L) 8.9 - 10.3 mg/dL   GFR calc non Af Amer >60 >60 mL/min   GFR calc Af Amer >60 >60 mL/min    Comment: (NOTE) The eGFR has been calculated using the CKD EPI equation. This calculation has not been validated in all clinical situations. eGFR's persistently <60 mL/min signify possible Chronic Kidney Disease.    Anion gap 12 5 - 15  TSH     Status: None   Collection Time: 02/21/16  9:19 PM  Result Value Ref Range   TSH 1.159 0.350 - 4.500 uIU/mL    Comment: Performed by a 3rd Generation assay  with a functional sensitivity of <=0.01 uIU/mL.  Blood gas, arterial     Status: Abnormal   Collection Time: 02/21/16 10:45 PM  Result Value Ref Range   O2 Content 2.0 L/min   Delivery systems NASAL CANNULA    pH, Arterial 7.430 7.350 - 7.450   pCO2 arterial 33.5 32.0 - 48.0 mmHg   pO2, Arterial 73.7 (L) 83.0 - 108.0 mmHg   Bicarbonate 21.8 20.0 - 28.0 mmol/L   Acid-base deficit 1.9 0.0 - 2.0 mmol/L   O2 Saturation 94.3 %   Patient temperature 98.6    Collection site RIGHT RADIAL    Drawn by [id number]    Sample type ARTERIAL DRAW    Allens test (pass/fail) PASS PASS  Troponin I (q 6hr x 3)     Status: Abnormal   Collection Time: 02/21/16 11:32 PM  Result Value Ref Range   Troponin I 0.08 (HH) <0.03 ng/mL    Comment: CRITICAL VALUE NOTED.  VALUE IS CONSISTENT WITH PREVIOUSLY REPORTED AND CALLED VALUE.  Basic metabolic panel     Status: Abnormal   Collection Time: 02/22/16   1:20 AM  Result Value Ref Range   Sodium 136 135 - 145 mmol/L   Potassium 3.8 3.5 - 5.1 mmol/L   Chloride 106 101 - 111 mmol/L   CO2 21 (L) 22 - 32 mmol/L   Glucose, Bld 106 (H) 65 - 99 mg/dL   BUN 18 6 - 20 mg/dL   Creatinine, Ser 0.83 0.61 - 1.24 mg/dL   Calcium 8.2 (L) 8.9 - 10.3 mg/dL   GFR calc non Af Amer >60 >60 mL/min   GFR calc Af Amer >60 >60 mL/min    Comment: (NOTE) The eGFR has been calculated using the CKD EPI equation. This calculation has not been validated in all clinical situations. eGFR's persistently <60 mL/min signify possible Chronic Kidney Disease.    Anion gap 9 5 - 15  Basic metabolic panel     Status: Abnormal   Collection Time: 02/22/16  7:23 AM  Result Value Ref Range   Sodium 137 135 - 145 mmol/L   Potassium 4.8 3.5 - 5.1 mmol/L    Comment: NO VISIBLE HEMOLYSIS   Chloride 107 101 - 111 mmol/L   CO2 21 (L) 22 - 32 mmol/L   Glucose, Bld 97 65 - 99 mg/dL   BUN 19 6 - 20 mg/dL   Creatinine, Ser 1.08 0.61 - 1.24 mg/dL   Calcium 7.9 (L) 8.9 - 10.3 mg/dL   GFR calc non Af Amer >60 >60 mL/min   GFR calc Af Amer >60 >60 mL/min    Comment: (NOTE) The eGFR has been calculated using the CKD EPI equation. This calculation has not been validated in all clinical situations. eGFR's persistently <60 mL/min signify possible Chronic Kidney Disease.    Anion gap 9 5 - 15  Troponin I (q 6hr x 3)     Status: None   Collection Time: 02/22/16  7:23 AM  Result Value Ref Range   Troponin I <0.03 <0.03 ng/mL  CBC with Differential/Platelet     Status: Abnormal   Collection Time: 02/22/16 10:54 AM  Result Value Ref Range   WBC 10.5 4.0 - 10.5 K/uL   RBC 3.60 (L) 4.22 - 5.81 MIL/uL   Hemoglobin 11.5 (L) 13.0 - 17.0 g/dL   HCT 33.2 (L) 39.0 - 52.0 %   MCV 92.2 78.0 - 100.0 fL   MCH 31.9 26.0 - 34.0 pg   MCHC 34.6  30.0 - 36.0 g/dL   RDW 14.3 11.5 - 15.5 %   Platelets 130 (L) 150 - 400 K/uL   Neutrophils Relative % 81 %   Neutro Abs 8.4 (H) 1.7 - 7.7 K/uL    Lymphocytes Relative 11 %   Lymphs Abs 1.2 0.7 - 4.0 K/uL   Monocytes Relative 7 %   Monocytes Absolute 0.8 0.1 - 1.0 K/uL   Eosinophils Relative 1 %   Eosinophils Absolute 0.1 0.0 - 0.7 K/uL   Basophils Relative 0 %   Basophils Absolute 0.0 0.0 - 0.1 K/uL  Lactoferrin, Fecal,Qualitative     Status: None   Collection Time: 02/22/16  9:31 PM  Result Value Ref Range   Lactoferrin, Fecal, Qual NEGATIVE NEGATIVE  C difficile quick scan w PCR reflex     Status: None   Collection Time: 02/22/16  9:31 PM  Result Value Ref Range   C Diff antigen NEGATIVE NEGATIVE   C Diff toxin NEGATIVE NEGATIVE   C Diff interpretation No C. difficile detected.   Folate     Status: None   Collection Time: 02/23/16  3:25 AM  Result Value Ref Range   Folate 17.4 >5.9 ng/mL  Heparin level (unfractionated)     Status: Abnormal   Collection Time: 02/23/16  3:25 AM  Result Value Ref Range   Heparin Unfractionated 0.12 (L) 0.30 - 0.70 IU/mL    Comment:        IF HEPARIN RESULTS ARE BELOW EXPECTED VALUES, AND PATIENT DOSAGE HAS BEEN CONFIRMED, SUGGEST FOLLOW UP TESTING OF ANTITHROMBIN III LEVELS.   CBC     Status: Abnormal   Collection Time: 02/23/16  3:26 AM  Result Value Ref Range   WBC 14.3 (H) 4.0 - 10.5 K/uL   RBC 3.76 (L) 4.22 - 5.81 MIL/uL   Hemoglobin 12.0 (L) 13.0 - 17.0 g/dL   HCT 34.6 (L) 39.0 - 52.0 %   MCV 92.0 78.0 - 100.0 fL   MCH 31.9 26.0 - 34.0 pg   MCHC 34.7 30.0 - 36.0 g/dL   RDW 14.6 11.5 - 15.5 %   Platelets 141 (L) 150 - 400 K/uL  Comprehensive metabolic panel     Status: Abnormal   Collection Time: 02/23/16  3:26 AM  Result Value Ref Range   Sodium 134 (L) 135 - 145 mmol/L   Potassium 3.0 (L) 3.5 - 5.1 mmol/L    Comment: DELTA CHECK NOTED   Chloride 103 101 - 111 mmol/L   CO2 20 (L) 22 - 32 mmol/L   Glucose, Bld 124 (H) 65 - 99 mg/dL   BUN 17 6 - 20 mg/dL   Creatinine, Ser 0.87 0.61 - 1.24 mg/dL   Calcium 8.0 (L) 8.9 - 10.3 mg/dL   Total Protein 5.5 (L) 6.5 - 8.1 g/dL    Albumin 2.2 (L) 3.5 - 5.0 g/dL   AST 35 15 - 41 U/L   ALT 28 17 - 63 U/L   Alkaline Phosphatase 50 38 - 126 U/L   Total Bilirubin 1.0 0.3 - 1.2 mg/dL   GFR calc non Af Amer >60 >60 mL/min   GFR calc Af Amer >60 >60 mL/min    Comment: (NOTE) The eGFR has been calculated using the CKD EPI equation. This calculation has not been validated in all clinical situations. eGFR's persistently <60 mL/min signify possible Chronic Kidney Disease.    Anion gap 11 5 - 15  Vitamin B12     Status: None   Collection Time:  02/23/16  3:26 AM  Result Value Ref Range   Vitamin B-12 591 180 - 914 pg/mL    Comment: (NOTE) This assay is not validated for testing neonatal or myeloproliferative syndrome specimens for Vitamin B12 levels.   Heparin level (unfractionated)     Status: Abnormal   Collection Time: 02/23/16 12:42 PM  Result Value Ref Range   Heparin Unfractionated <0.10 (L) 0.30 - 0.70 IU/mL    Comment:        IF HEPARIN RESULTS ARE BELOW EXPECTED VALUES, AND PATIENT DOSAGE HAS BEEN CONFIRMED, SUGGEST FOLLOW UP TESTING OF ANTITHROMBIN III LEVELS.       Component Value Date/Time   SDES URINE, RANDOM 02/21/2016 0626   SPECREQUEST NONE 02/21/2016 0626   CULT <10,000 COLONIES/mL INSIGNIFICANT GROWTH (A) 02/21/2016 0626   REPTSTATUS 02/22/2016 FINAL 02/21/2016 0626   Dg Abd Portable 1v  Result Date: 02/23/2016 CLINICAL DATA:  Rule out ileus. EXAM: PORTABLE ABDOMEN - 1 VIEW COMPARISON:  February 21, 2016 FINDINGS: Gastric distention. The large and small bowel are normal in caliber. No other abnormalities. IMPRESSION: Gastric distention. The large and small bowel are normal in caliber. Electronically Signed   By: Dorise Bullion III M.D   On: 02/23/2016 08:56   Recent Results (from the past 240 hour(s))  Blood Culture (routine x 2)     Status: None (Preliminary result)   Collection Time: 02/21/16  6:09 AM  Result Value Ref Range Status   Specimen Description BLOOD RIGHT ARM  Final    Special Requests BOTTLES DRAWN AEROBIC AND ANAEROBIC 5ML  Final   Culture  Setup Time   Final    GRAM POSITIVE COCCI IN CHAINS ANAEROBIC BOTTLE ONLY CRITICAL RESULT CALLED TO, READ BACK BY AND VERIFIED WITH: E MARTIN,PHARMD AT 1600 02/22/16 BY J FUDESCO    Culture   Final    GRAM POSITIVE COCCI CULTURE REINCUBATED FOR BETTER GROWTH    Report Status PENDING  Incomplete  Blood Culture ID Panel (Reflexed)     Status: Abnormal   Collection Time: 02/21/16  6:09 AM  Result Value Ref Range Status   Enterococcus species NOT DETECTED NOT DETECTED Final   Listeria monocytogenes NOT DETECTED NOT DETECTED Final   Staphylococcus species NOT DETECTED NOT DETECTED Final   Staphylococcus aureus NOT DETECTED NOT DETECTED Final   Streptococcus species DETECTED (A) NOT DETECTED Final    Comment: Not Enterococcus species, Streptococcus agalactiae, Streptococcus pyogenes, or Streptococcus pneumoniae. CRITICAL RESULT CALLED TO, READ BACK BY AND VERIFIED WITH: E. MARTIN, 02/22/16 AT 1600 BY J FUDESCO    Streptococcus agalactiae NOT DETECTED NOT DETECTED Final   Streptococcus pneumoniae NOT DETECTED NOT DETECTED Final   Streptococcus pyogenes NOT DETECTED NOT DETECTED Final   Acinetobacter baumannii NOT DETECTED NOT DETECTED Final   Enterobacteriaceae species NOT DETECTED NOT DETECTED Final   Enterobacter cloacae complex NOT DETECTED NOT DETECTED Final   Escherichia coli NOT DETECTED NOT DETECTED Final   Klebsiella oxytoca NOT DETECTED NOT DETECTED Final   Klebsiella pneumoniae NOT DETECTED NOT DETECTED Final   Proteus species NOT DETECTED NOT DETECTED Final   Serratia marcescens NOT DETECTED NOT DETECTED Final   Haemophilus influenzae NOT DETECTED NOT DETECTED Final   Neisseria meningitidis NOT DETECTED NOT DETECTED Final   Pseudomonas aeruginosa NOT DETECTED NOT DETECTED Final   Candida albicans NOT DETECTED NOT DETECTED Final   Candida glabrata NOT DETECTED NOT DETECTED Final   Candida krusei NOT  DETECTED NOT DETECTED Final   Candida parapsilosis NOT DETECTED  NOT DETECTED Final   Candida tropicalis NOT DETECTED NOT DETECTED Final  Blood Culture (routine x 2)     Status: None (Preliminary result)   Collection Time: 02/21/16  6:17 AM  Result Value Ref Range Status   Specimen Description BLOOD LEFT ARM  Final   Special Requests BOTTLES DRAWN AEROBIC AND ANAEROBIC 5ML  Final   Culture  Setup Time   Final    GRAM POSITIVE COCCI IN CHAINS ANAEROBIC BOTTLE ONLY CRITICAL VALUE NOTED.  VALUE IS CONSISTENT WITH PREVIOUSLY REPORTED AND CALLED VALUE.    Culture   Final    GRAM POSITIVE COCCI CULTURE REINCUBATED FOR BETTER GROWTH    Report Status PENDING  Incomplete  Urine culture     Status: Abnormal   Collection Time: 02/21/16  6:26 AM  Result Value Ref Range Status   Specimen Description URINE, RANDOM  Final   Special Requests NONE  Final   Culture <10,000 COLONIES/mL INSIGNIFICANT GROWTH (A)  Final   Report Status 02/22/2016 FINAL  Final  Respiratory Panel by PCR     Status: None   Collection Time: 02/21/16  1:20 PM  Result Value Ref Range Status   Adenovirus NOT DETECTED NOT DETECTED Final   Coronavirus 229E NOT DETECTED NOT DETECTED Final   Coronavirus HKU1 NOT DETECTED NOT DETECTED Final   Coronavirus NL63 NOT DETECTED NOT DETECTED Final   Coronavirus OC43 NOT DETECTED NOT DETECTED Final   Metapneumovirus NOT DETECTED NOT DETECTED Final   Rhinovirus / Enterovirus NOT DETECTED NOT DETECTED Final   Influenza A NOT DETECTED NOT DETECTED Final   Influenza B NOT DETECTED NOT DETECTED Final   Parainfluenza Virus 1 NOT DETECTED NOT DETECTED Final   Parainfluenza Virus 2 NOT DETECTED NOT DETECTED Final   Parainfluenza Virus 3 NOT DETECTED NOT DETECTED Final   Parainfluenza Virus 4 NOT DETECTED NOT DETECTED Final   Respiratory Syncytial Virus NOT DETECTED NOT DETECTED Final   Bordetella pertussis NOT DETECTED NOT DETECTED Final   Chlamydophila pneumoniae NOT DETECTED NOT DETECTED  Final   Mycoplasma pneumoniae NOT DETECTED NOT DETECTED Final  MRSA PCR Screening     Status: None   Collection Time: 02/21/16  8:43 PM  Result Value Ref Range Status   MRSA by PCR NEGATIVE NEGATIVE Final    Comment:        The GeneXpert MRSA Assay (FDA approved for NASAL specimens only), is one component of a comprehensive MRSA colonization surveillance program. It is not intended to diagnose MRSA infection nor to guide or monitor treatment for MRSA infections.   C difficile quick scan w PCR reflex     Status: None   Collection Time: 02/22/16  9:31 PM  Result Value Ref Range Status   C Diff antigen NEGATIVE NEGATIVE Final   C Diff toxin NEGATIVE NEGATIVE Final   C Diff interpretation No C. difficile detected.  Final      02/23/2016, 4:38 PM     LOS: 2 days    Records and images were personally reviewed where available.

## 2016-02-23 NOTE — Progress Notes (Addendum)
MRI images brought in by family and hand delivered to radiology by [place] secretary per Dr. Garen Lah order.

## 2016-02-23 NOTE — Progress Notes (Signed)
[place] TEAM 1 - [place]  [id number] DOB: 1945-11-16 DOA: 02/21/2016 PCP: Sinclair Grooms, MD    Brief Narrative:  71 y.o. male with history of CAD s/p angioplasty, HTN, hyperlipidemia, and arthritis who presented to the ED with altered mental status. 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 He visited a physician and was given a prescription for steroids and hydrocodone, which he said made him feel "weird". His back pain did not improve and he sought medical attention again. On his return visit the patient was given Flexeril and Percocet in addition to his steroids.  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  The pt was instructed to present to the ED.      In the ED the patient was found to be in SVT and was given adenosine which revealed A. fib with RVR. He was started on a diltiazem drip after which he spontaneously converted to normal sinus rhythm.  Subjective: The patient's mental status has improved.  He is alert and conversant.  He is mildly confused when you push him to a great extent but overall is much more alert and oriented.  He denies change in his low back pain.  He reports no weakness in his lower extremities or loss of sensation.  He denies chest pain but has been experiencing significant nausea and indigestion type symptoms.  He is having profuse watery diarrhea as well.  Assessment & Plan:  Sepsis of unclear source - Strep bacteremia 2 of 2 blood cx  follow blood cx speciation and sensitivities - urine culture not revealing - CXR w/o evidence of infiltrate - flu/resp virus negative - there is some concern about a possible epidural abscess and the pt will need f/u MRI imaging once he is more stable medically and able to cooperate - still no exam findings to suggest cord  compression at this time - fever curve trending down   Newly diagnosed Afib w/ RVR  TSH is normal - rate control improved but not yet at goal - CHA2DS2-VASc is at least 2 > heparin gtt - pt is established w/ Dr. Tamala Julian so will consult Cards to follow in AM - increase BB today   Diarrhea Multiple loose stools noted over night - Cdiff testing negative - likely abx associated - trial of imodium  Hypokalemia Due to GI losses - replace to goal of 4.0 - follow Mg as well  CAD Remote inferior MI - asymptomatic at this time   HTN BP stable at present   HLD Cont lipitor  DVT prophylaxis: IV heparin   Code Status: FULL CODE Family Communication: No family present at time of exam today  Disposition Plan: SDU  Consultants:  [facility] Orthoped[facility]  Procedures: none  Antimicrobials:  Zosyn 2/2 > 2/3 Vanc 2/2 > 2/3 Rocephin 2/3 >  Objective: Blood pressure 133/89, pulse 94, temperature 100.1 F (37.8 C), temperature source Axillary, resp. rate 18, height 6\' 1"  (1.854 m), weight 101.6 kg (223 lb 15.8 oz), SpO2 97 %.  Intake/Output Summary (Last 24 hours) at 02/23/16 1103 Last data filed at 02/23/16 1000  Gross per 24 hour  Intake          1094.07 ml  Output  2050 ml  Net          -955.93 ml   Filed Weights   02/21/16 2026 02/22/16 0300 02/23/16 0250  Weight: 99.9 kg (220 lb 3.8 oz) 100.9 kg (222 lb 7.1 oz) 101.6 kg (223 lb 15.8 oz)    Examination: General: No acute respiratory distress - alert and oriented  Lungs: Clear to auscultation bilaterally - no wheezing  Cardiovascular: tachycardic - irreg irreg - no appreciable M Abdomen: Nontender, nondistended, soft, bowel sounds positive, no rebound, no ascites, no appreciable mass Extremities: No significant cyanosis, clubbing, or edema bilateral lower extremities Neuro:  5/5 strength B LE - intact sensation to touch B LE - no babinski B  CBC:  Recent Labs Lab 02/21/16 0612 02/21/16 0633 02/21/16 2118 02/22/16 1054  02/23/16 0326  WBC 11.0*  --  9.7 10.5 14.3*  NEUTROABS 8.6*  --  7.5 8.4*  --   HGB 13.7 13.3 12.2* 11.5* 12.0*  HCT 38.9* 39.0 35.2* 33.2* 34.6*  MCV 91.5  --  92.6 92.2 92.0  PLT 136*  --  125* 130* [id number]*   Basic Metabolic Panel:  Recent Labs Lab 02/21/16 1744 02/21/16 1836 02/21/16 2119 02/22/16 0120 02/22/16 0723 02/23/16 0326  NA 134*  --  134* 136 137 134*  K 3.9  --  3.8 3.8 4.8 3.0*  CL 103  --  102 106 107 103  CO2 21*  --  20* 21* 21* 20*  GLUCOSE 101*  --  113* 106* 97 124*  BUN 19  --  20 18 19 17   CREATININE 0.87  --  0.90 0.83 1.08 0.87  CALCIUM 8.5*  --  8.3* 8.2* 7.9* 8.0*  MG  --  1.8  --   --   --   --   PHOS  --  3.0  --   --   --   --    GFR: Estimated Creatinine Clearance: 97.6 mL/min (by C-G formula based on SCr of 0.87 mg/dL).  Liver Function Tests:  Recent Labs Lab 02/21/16 0612 02/23/16 0326  AST 30 35  ALT 26 28  ALKPHOS 58 50  BILITOT 1.0 1.0  PROT 6.4* 5.5*  ALBUMIN 3.0* 2.2*    Recent Labs Lab 02/21/16 1836  LIPASE 16    Coagulation Profile:  Recent Labs Lab 02/21/16 0647  INR 1.26    Cardiac Enzymes:  Recent Labs Lab 02/21/16 1836 02/21/16 2118 02/21/16 2332 02/22/16 0723  TROPONINI 0.06* 0.03* 0.08* <0.03    Recent Results (from the past 240 hour(s))  Blood Culture (routine x 2)     Status: None (Preliminary result)   Collection Time: 02/21/16  6:09 AM  Result Value Ref Range Status   Specimen Description BLOOD RIGHT ARM  Final   Special Requests BOTTLES DRAWN AEROBIC AND ANAEROBIC 5ML  Final   Culture  Setup Time   Final    GRAM POSITIVE COCCI IN CHAINS ANAEROBIC BOTTLE ONLY CRITICAL RESULT CALLED TO, READ BACK BY AND VERIFIED WITH: E MARTIN,PHARMD AT 1600 02/22/16 BY J FUDESCO    Culture GRAM POSITIVE COCCI  Final   Report Status PENDING  Incomplete  Blood Culture ID Panel (Reflexed)     Status: Abnormal   Collection Time: 02/21/16  6:09 AM  Result Value Ref Range Status   Enterococcus species NOT  DETECTED NOT DETECTED Final   Listeria monocytogenes NOT DETECTED NOT DETECTED Final   Staphylococcus species NOT DETECTED NOT DETECTED Final   Staphylococcus aureus NOT DETECTED  NOT DETECTED Final   Streptococcus species DETECTED (A) NOT DETECTED Final    Comment: Not Enterococcus species, Streptococcus agalactiae, Streptococcus pyogenes, or Streptococcus pneumoniae. CRITICAL RESULT CALLED TO, READ BACK BY AND VERIFIED WITH: E. MARTIN, 02/22/16 AT 1600 BY J FUDESCO    Streptococcus agalactiae NOT DETECTED NOT DETECTED Final   Streptococcus pneumoniae NOT DETECTED NOT DETECTED Final   Streptococcus pyogenes NOT DETECTED NOT DETECTED Final   Acinetobacter baumannii NOT DETECTED NOT DETECTED Final   Enterobacteriaceae species NOT DETECTED NOT DETECTED Final   Enterobacter cloacae complex NOT DETECTED NOT DETECTED Final   Escherichia coli NOT DETECTED NOT DETECTED Final   Klebsiella oxytoca NOT DETECTED NOT DETECTED Final   Klebsiella pneumoniae NOT DETECTED NOT DETECTED Final   Proteus species NOT DETECTED NOT DETECTED Final   Serratia marcescens NOT DETECTED NOT DETECTED Final   Haemophilus influenzae NOT DETECTED NOT DETECTED Final   Neisseria meningitidis NOT DETECTED NOT DETECTED Final   Pseudomonas aeruginosa NOT DETECTED NOT DETECTED Final   Candida albicans NOT DETECTED NOT DETECTED Final   Candida glabrata NOT DETECTED NOT DETECTED Final   Candida krusei NOT DETECTED NOT DETECTED Final   Candida parapsilosis NOT DETECTED NOT DETECTED Final   Candida tropicalis NOT DETECTED NOT DETECTED Final  Blood Culture (routine x 2)     Status: None (Preliminary result)   Collection Time: 02/21/16  6:17 AM  Result Value Ref Range Status   Specimen Description BLOOD LEFT ARM  Final   Special Requests BOTTLES DRAWN AEROBIC AND ANAEROBIC 5ML  Final   Culture  Setup Time   Final    GRAM POSITIVE COCCI IN CHAINS ANAEROBIC BOTTLE ONLY    Culture GRAM POSITIVE COCCI  Final   Report Status  PENDING  Incomplete  Urine culture     Status: Abnormal   Collection Time: 02/21/16  6:26 AM  Result Value Ref Range Status   Specimen Description URINE, RANDOM  Final   Special Requests NONE  Final   Culture <10,000 COLONIES/mL INSIGNIFICANT GROWTH (A)  Final   Report Status 02/22/2016 FINAL  Final  Respiratory Panel by PCR     Status: None   Collection Time: 02/21/16  1:20 PM  Result Value Ref Range Status   Adenovirus NOT DETECTED NOT DETECTED Final   Coronavirus 229E NOT DETECTED NOT DETECTED Final   Coronavirus HKU1 NOT DETECTED NOT DETECTED Final   Coronavirus NL63 NOT DETECTED NOT DETECTED Final   Coronavirus OC43 NOT DETECTED NOT DETECTED Final   Metapneumovirus NOT DETECTED NOT DETECTED Final   Rhinovirus / Enterovirus NOT DETECTED NOT DETECTED Final   Influenza A NOT DETECTED NOT DETECTED Final   Influenza B NOT DETECTED NOT DETECTED Final   Parainfluenza Virus 1 NOT DETECTED NOT DETECTED Final   Parainfluenza Virus 2 NOT DETECTED NOT DETECTED Final   Parainfluenza Virus 3 NOT DETECTED NOT DETECTED Final   Parainfluenza Virus 4 NOT DETECTED NOT DETECTED Final   Respiratory Syncytial Virus NOT DETECTED NOT DETECTED Final   Bordetella pertussis NOT DETECTED NOT DETECTED Final   Chlamydophila pneumoniae NOT DETECTED NOT DETECTED Final   Mycoplasma pneumoniae NOT DETECTED NOT DETECTED Final  MRSA PCR Screening     Status: None   Collection Time: 02/21/16  8:43 PM  Result Value Ref Range Status   MRSA by PCR NEGATIVE NEGATIVE Final    Comment:        The GeneXpert MRSA Assay (FDA approved for NASAL specimens only), is one component of a comprehensive  MRSA colonization surveillance program. It is not intended to diagnose MRSA infection nor to guide or monitor treatment for MRSA infections.   C difficile quick scan w PCR reflex     Status: None   Collection Time: 02/22/16  9:31 PM  Result Value Ref Range Status   C Diff antigen NEGATIVE NEGATIVE Final   C Diff toxin  NEGATIVE NEGATIVE Final   C Diff interpretation No C. difficile detected.  Final     Scheduled Meds: . atorvastatin  40 mg Oral Daily  . cefTRIAXone (ROCEPHIN)  IV  2 g Intravenous Q24H  . docusate  100 mg Oral BID  . folic acid  1 mg Oral Daily  . lidocaine  2 patch Transdermal Q24H  . lisinopril  20 mg Oral Daily  . mouth rinse  15 mL Mouth Rinse BID  . metoprolol  5 mg Intravenous Q6H  . multivitamin with minerals  1 tablet Oral Daily  . thiamine  100 mg Oral Daily   Continuous Infusions: . sodium chloride 50 mL/hr at 02/23/16 1000  . diltiazem (CARDIZEM) infusion 15 mg/hr (02/23/16 0800)  . heparin 1,500 Units/hr (02/23/16 0800)     LOS: 2 days   Cherene Altes, MD [facility] Office  [phone] Pager - Text Page per Amion as per below:  On-Call/Text Page:      [email or web address]      password [email or web address]  If 7PM-7AM, please contact night-coverage [email or web address] Password [email or web address] 02/23/2016, 11:03 AM

## 2016-02-23 NOTE — Progress Notes (Signed)
ANTICOAGULATION CONSULT NOTE - Follow Up Consult  Pharmacy Consult for Heparin  Indication: atrial fibrillation  Allergies  Allergen Reactions  . Other Other (See Comments)    Pt reports allergic reaction to melons - unknown reaction  . Ciprofloxacin Hives   Patient Measurements: Height: 6\' 1"  (185.4 cm) Weight: 223 lb 15.8 oz (101.6 kg) IBW/kg (Calculated) : 79.9  Vital Signs: Temp: 97.6 F (36.4 C) (02/04 0250) Temp Source: Oral (02/04 0250) BP: 138/79 (02/04 0250) Pulse Rate: 113 (02/04 0250)  Labs:  Recent Labs  02/21/16 [id number]  02/21/16 2118  02/21/16 2332 02/22/16 0120 02/22/16 0723 02/22/16 1054 02/23/16 0325 02/23/16 0326  HGB  --   --  12.2*  --   --   --   --  11.5*  --  12.0*  HCT  --   --  35.2*  --   --   --   --  33.2*  --  34.6*  PLT  --   --  125*  --   --   --   --  130*  --  141*  LABPROT 15.8*  --   --   --   --   --   --   --   --   --   INR 1.26  --   --   --   --   --   --   --   --   --   HEPARINUNFRC  --   --   --   --   --   --   --   --  0.12*  --   CREATININE  --   < >  --   < >  --  0.83 1.08  --   --  0.87  TROPONINI  --   < > 0.03*  --  0.08*  --  <0.03  --   --   --   < > = values in this interval not displayed.  Estimated Creatinine Clearance: 97.6 mL/min (by C-G formula based on SCr of 0.87 mg/dL).   Assessment: Heparin for afib, initial heparin level low  Goal of Therapy:  Heparin level 0.3-0.7 units/ml Monitor platelets by anticoagulation protocol: Yes   Plan:  -Heparin 2000 units BOLUS -Inc heparin to 1500 units/hr -1300 HL  Narda Bonds 02/23/2016,4:21 AM

## 2016-02-23 NOTE — Progress Notes (Signed)
Notified that pt had 3 beats of ventricular bigeminy, Dr Thereasa Solo on the unit and notified. No new orders received at this time. Consuelo Pandy RN

## 2016-02-23 NOTE — Progress Notes (Addendum)
ANTICOAGULATION CONSULT NOTE - Follow Up Consult  Pharmacy Consult for Heparin  Indication: atrial fibrillation  Allergies  Allergen Reactions  . Other Other (See Comments)    Pt reports allergic reaction to melons - unknown reaction  . Ciprofloxacin Hives   Patient Measurements: Height: 6\' 1"  (185.4 cm) Weight: 223 lb 15.8 oz (101.6 kg) IBW/kg (Calculated) : 79.9 Heparin dosing weight 99.9 kg  Vital Signs: Temp: 99.5 F (37.5 C) (02/04 1911) Temp Source: Axillary (02/04 1911) BP: 133/81 (02/04 1911) Pulse Rate: 96 (02/04 1911)  Labs:  Recent Labs  02/21/16 0647  02/21/16 2118  02/21/16 2332 02/22/16 0120 02/22/16 0723 02/22/16 1054 02/23/16 0325 02/23/16 0326 02/23/16 1242 02/23/16 2027  HGB  --   --  12.2*  --   --   --   --  11.5*  --  12.0*  --   --   HCT  --   --  35.2*  --   --   --   --  33.2*  --  34.6*  --   --   PLT  --   --  125*  --   --   --   --  130*  --  141*  --   --   LABPROT 15.8*  --   --   --   --   --   --   --   --   --   --   --   INR 1.26  --   --   --   --   --   --   --   --   --   --   --   HEPARINUNFRC  --   --   --   --   --   --   --   --  0.12*  --  <0.10* 0.19*  CREATININE  --   < >  --   < >  --  0.83 1.08  --   --  0.87  --   --   TROPONINI  --   < > 0.03*  --  0.08*  --  <0.03  --   --   --   --   --   < > = values in this interval not displayed.  Estimated Creatinine Clearance: 97.6 mL/min (by C-G formula based on SCr of 0.87 mg/dL).   Assessment: 71 y.o male admmitted 02/21/16 with sepsis.  Newly diagnosed with Afib w/RVR>sinus tachycardia.- CHA2DS2-VASc is at least 2. Pharmacy consulted for IV heparin for Afib.  -heparin level= 0.19 after increase to 1700 units/hr  Goal of Therapy:  Heparin level 0.3-0.7 units/ml Monitor platelets by anticoagulation protocol: Yes   Plan:   -Heparin 1000 unit bolus -Increase heparin rate to 1900 units/hr -Heparin level in 6 hrs -Daily heparin level, CBC  Hildred Laser, Pharm D  02/23/2016 10:08 PM

## 2016-02-23 NOTE — Progress Notes (Signed)
ANTICOAGULATION CONSULT NOTE - Follow Up Consult  Pharmacy Consult for Heparin  Indication: atrial fibrillation  Allergies  Allergen Reactions  . Other Other (See Comments)    Pt reports allergic reaction to melons - unknown reaction  . Ciprofloxacin Hives   Patient Measurements: Height: 6\' 1"  (185.4 cm) Weight: 223 lb 15.8 oz (101.6 kg) IBW/kg (Calculated) : 79.9 Heparin dosing weight 99.9 kg  Vital Signs: Temp: 100.1 F (37.8 C) (02/04 0700) Temp Source: Axillary (02/04 0700) BP: 150/93 (02/04 0800) Pulse Rate: 112 (02/04 0800)  Labs:  Recent Labs  02/21/16 0647  02/21/16 2118  02/21/16 2332 02/22/16 0120 02/22/16 0723 02/22/16 1054 02/23/16 0325 02/23/16 0326  HGB  --   --  12.2*  --   --   --   --  11.5*  --  12.0*  HCT  --   --  35.2*  --   --   --   --  33.2*  --  34.6*  PLT  --   --  125*  --   --   --   --  130*  --  141*  LABPROT 15.8*  --   --   --   --   --   --   --   --   --   INR 1.26  --   --   --   --   --   --   --   --   --   HEPARINUNFRC  --   --   --   --   --   --   --   --  0.12*  --   CREATININE  --   < >  --   < >  --  0.83 1.08  --   --  0.87  TROPONINI  --   < > 0.03*  --  0.08*  --  <0.03  --   --   --   < > = values in this interval not displayed.  Estimated Creatinine Clearance: 97.6 mL/min (by C-G formula based on SCr of 0.87 mg/dL).   Assessment: 71 y.o male admmitted 02/21/16 with sepsis.  Newly diagnosed with Afib w/RVR>sinus tachycardia.- CHA2DS2-VASc is at least 2. Pharmacy consulted to start IV heparin for Afib. Hgb low stable, PLTC up to 141, no bleeding noted.   Heparin level subtherapeutic after re-bolus and rate increase early this morning. Heparin level now <0.10. Per RN - no issues with line/interruptions in therapy. No bleeding noted.   Goal of Therapy:  Heparin level 0.3-0.7 units/ml Monitor platelets by anticoagulation protocol: Yes   Plan:  -Heparin 3000 unit bolus -Increase heparin rate to 1700 units/hr -Heparin  level in 6 hrs -Daily heparin level, CBC -Watch for s/sx bleeding -f/u switch to PO anticoagulation.   Carlean Jews, Pharm.D. PGY1 Pharmacy Resident 2/4/201811:36 AM Pager [phone]

## 2016-02-23 NOTE — Progress Notes (Signed)
Subjective:    Patient reports pain as 3 on 0-10 scale.  Afebrile but WBC is elevated. Dr. Tama Gander is the admitting MD. Family requested Dr. Cyndy Freeze to see him. I called his friend today out of courtesy to the patient,since I am no longer his physician of record. .She desires Dr. Cyndy Freeze to do his future surgery. I gave her a stat copy of his MRI from my office as well as the report and thus far she states that she has not given it to Jonathan Holden.I emphasized that this was of extreme importance in regards to his care. I also gave her a copy for Dr. Cyndy Freeze. She said that she dropped that off to his office and is waiting for his follow up. His cultures have shown that he has a Strep septicemia.He needs to have an Infectious Disease MD evaluate him and rule out a Disc Space infection.They need to review the MRI from my office that I had done Stat on Thursday night.It may be necessary to repeat this but I think Our Radiologist needs to see my MRI first.I spoke with with Mr. izyk, goldfine patient and he stated that he does not have a family. His friend Cloyde Reams is his only contact person.-    Objective: Vital signs in last 24 hours: Temp:  [97.6 F (36.4 C)-100.4 F (38 C)] 98.7 F (37.1 C) (02/04 1154) Pulse Rate:  [94-127] 109 (02/04 1200) Resp:  [18-36] 22 (02/04 1200) BP: (124-150)/(52-93) 138/85 (02/04 1200) SpO2:  [94 %-100 %] 98 % (02/04 1200) Weight:  [101.6 kg (223 lb 15.8 oz)] 101.6 kg (223 lb 15.8 oz) (02/04 0250)  Intake/Output from previous day: 02/03 0701 - 02/04 0700 In: 240 [P.O.:240] Out: 1450 [Urine:1450] Intake/Output this shift: Total I/O In: 1792.1 [P.O.:480; I.V.:1312.1] Out: 600 [Urine:600]   Recent Labs  02/21/16 0612 02/21/16 0633 02/21/16 2118 02/22/16 1054 02/23/16 0326  HGB 13.7 13.3 12.2* 11.5* 12.0*    Recent Labs  02/22/16 1054 02/23/16 0326  WBC 10.5 14.3*  RBC 3.60* 3.76*  HCT 33.2* 34.6*  PLT 130* 141*    Recent Labs  02/22/16 0723 02/23/16 0326   NA 137 134*  K 4.8 3.0*  CL 107 103  CO2 21* 20*  BUN 19 17  CREATININE 1.08 0.87  GLUCOSE 97 124*  CALCIUM 7.9* 8.0*    Recent Labs  02/21/16 0647  INR 1.26    Neurovascular intact Dorsiflexion/Plantar flexion intact Old back incisions look fine. He has pain over the Right side of his back.  Assessment/Plan:    Up with therapy.Infectious Disease Consult.  Jahon Bart A 02/23/2016, 3:54 PM

## 2016-02-24 ENCOUNTER — Encounter (HOSPITAL_COMMUNITY): Payer: Self-pay | Admitting: Cardiology

## 2016-02-24 ENCOUNTER — Inpatient Hospital Stay (HOSPITAL_COMMUNITY): Payer: Medicare Other

## 2016-02-24 DIAGNOSIS — A409 Streptococcal sepsis, unspecified: Principal | ICD-10-CM

## 2016-02-24 DIAGNOSIS — M4647 Discitis, unspecified, lumbosacral region: Secondary | ICD-10-CM

## 2016-02-24 DIAGNOSIS — M4646 Discitis, unspecified, lumbar region: Secondary | ICD-10-CM

## 2016-02-24 DIAGNOSIS — R7881 Bacteremia: Secondary | ICD-10-CM

## 2016-02-24 DIAGNOSIS — I4891 Unspecified atrial fibrillation: Secondary | ICD-10-CM

## 2016-02-24 DIAGNOSIS — B955 Unspecified streptococcus as the cause of diseases classified elsewhere: Secondary | ICD-10-CM

## 2016-02-24 LAB — CBC
HCT: 36.1 % — ABNORMAL LOW (ref 39.0–52.0)
Hemoglobin: 12.3 g/dL — ABNORMAL LOW (ref 13.0–17.0)
MCH: 31.4 pg (ref 26.0–34.0)
MCHC: 34.1 g/dL (ref 30.0–36.0)
MCV: 92.1 fL (ref 78.0–100.0)
Platelets: 183 10*3/uL (ref 150–400)
RBC: 3.92 MIL/uL — ABNORMAL LOW (ref 4.22–5.81)
RDW: 15 % (ref 11.5–15.5)
WBC: 13.3 10*3/uL — ABNORMAL HIGH (ref 4.0–10.5)

## 2016-02-24 LAB — COMPREHENSIVE METABOLIC PANEL
ALT: 49 U/L (ref 17–63)
ANION GAP: 8 (ref 5–15)
AST: 60 U/L — ABNORMAL HIGH (ref 15–41)
Albumin: 2 g/dL — ABNORMAL LOW (ref 3.5–5.0)
Alkaline Phosphatase: 57 U/L (ref 38–126)
BUN: 20 mg/dL (ref 6–20)
CHLORIDE: 105 mmol/L (ref 101–111)
CO2: 20 mmol/L — AB (ref 22–32)
CREATININE: 0.82 mg/dL (ref 0.61–1.24)
Calcium: 7.6 mg/dL — ABNORMAL LOW (ref 8.9–10.3)
GFR calc Af Amer: >60 mL/min (ref >60)
Glucose, Bld: 129 mg/dL — ABNORMAL HIGH (ref 65–99)
POTASSIUM: 3.3 mmol/L — AB (ref 3.5–5.1)
SODIUM: 133 mmol/L — AB (ref 135–145)
Total Bilirubin: 0.6 mg/dL (ref 0.3–1.2)
Total Protein: 5.4 g/dL — ABNORMAL LOW (ref 6.5–8.1)

## 2016-02-24 LAB — MAGNESIUM: Magnesium: 2.4 mg/dL (ref 1.7–2.4)

## 2016-02-24 LAB — HEPARIN LEVEL (UNFRACTIONATED)
Heparin Unfractionated: 0.18 [IU]/mL — ABNORMAL LOW (ref 0.30–0.70)
Heparin Unfractionated: 0.27 [IU]/mL — ABNORMAL LOW (ref 0.30–0.70)
Heparin Unfractionated: 0.45 [IU]/mL (ref 0.30–0.70)

## 2016-02-24 MED ORDER — MELATONIN 3 MG PO TABS
9.0000 mg | ORAL_TABLET | Freq: Every evening | ORAL | Status: DC | PRN
  Administered 2016-02-24 – 2016-02-27 (×4): 9 mg via ORAL
  Filled 2016-02-24 (×5): qty 3

## 2016-02-24 MED ORDER — HALOPERIDOL LACTATE 5 MG/ML IJ SOLN
1.0000 mg | Freq: Four times a day (QID) | INTRAMUSCULAR | Status: DC | PRN
  Administered 2016-02-26: 1 mg via INTRAVENOUS
  Filled 2016-02-24: qty 1

## 2016-02-24 MED ORDER — PNEUMOCOCCAL VAC POLYVALENT 25 MCG/0.5ML IJ INJ
0.5000 mL | INJECTION | INTRAMUSCULAR | Status: DC
  Filled 2016-02-24: qty 0.5

## 2016-02-24 MED ORDER — ALIROCUMAB 150 MG/ML ~~LOC~~ SOPN
150.0000 mg | PEN_INJECTOR | SUBCUTANEOUS | Status: DC
  Administered 2016-02-24: 150 mg via SUBCUTANEOUS

## 2016-02-24 MED ORDER — METOPROLOL TARTRATE 50 MG PO TABS
50.0000 mg | ORAL_TABLET | Freq: Two times a day (BID) | ORAL | Status: DC
  Administered 2016-02-24 – 2016-02-26 (×4): 50 mg via ORAL
  Filled 2016-02-24 (×4): qty 1

## 2016-02-24 MED ORDER — LOPERAMIDE HCL 2 MG PO CAPS
2.0000 mg | ORAL_CAPSULE | ORAL | Status: DC | PRN
  Administered 2016-02-24 – 2016-02-26 (×2): 2 mg via ORAL
  Filled 2016-02-24 (×3): qty 1

## 2016-02-24 NOTE — Progress Notes (Signed)
[facility] TEAM 1 - [place]  [id number] DOB: 10/22/1945 DOA: 02/21/2016 PCP: Sinclair Grooms, MD    Brief Narrative:  72 y.o. male with history of CAD s/p angioplasty, HTN, hyperlipidemia, and arthritis who presented to the ED with altered mental status. 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 He visited a physician and was given a prescription for steroids and hydrocodone, which he said made him feel "weird". His back pain did not improve and he sought medical attention again. On his return visit the patient was given Flexeril and Percocet in addition to his steroids.  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  The pt was instructed to present to the ED.      In the ED the patient was found to be in SVT and was given adenosine which revealed A. fib with RVR. He was started on a diltiazem drip after which he spontaneously converted to normal sinus rhythm.  Subjective: The patient is resting comfortably in bed.  He is much more alert and conversant but does remain mildly confused on the finer points of his current situation.  He actually denies any low back pain at the present time.  He denies chest pain nausea vomiting abdominal pain or shortness of breath.  Assessment & Plan:  Sepsis of unclear source - Strep bacteremia (2 of 2 blood cx)  follow blood cx speciation and sensitivities - urine culture not revealing - CXR w/o evidence of infiltrate - flu/resp virus negative - there is concern about a possible epidural abscess, and we are awaiting the Radiolgist's interpretation of the MRI he had accomplished at [place] - no exam findings to suggest cord compression today - fever curve continues to trend down - ID following  Newly diagnosed Afib w/ RVR  TSH is normal - HR well controlled at this  time - CHA2DS2-VASc is at least 2 > heparin gtt - consulted Cardiology today - attempt to wean off cardizem gtt   Diarrhea Multiple loose stools noted over night 2/4 - Cdiff testing negative - likely abx associated - trial of imodium - flexiseal until more mobile   Hypokalemia Due to GI losses - cont to replace to goal of 4.0 - Mg is normal   CAD Remote inferior MI - asymptomatic at this time   HTN BP stable at present   HLD Cont lipitor  DVT prophylaxis: IV heparin   Code Status: FULL CODE Family Communication: No family present at time of exam today  Disposition Plan: SDU  Consultants:  [facility]   Procedures: none  Antimicrobials:  Zosyn 2/2 > 2/3 Vanc 2/2 > 2/3 Rocephin 2/3 >  Objective: Blood pressure 117/72, pulse 86, temperature 98.3 F (36.8 C), temperature source Oral, resp. rate (!) 21, height 6\' 1"  (1.854 m), weight 101.6 kg (223 lb 15.8 oz), SpO2 96 %.  Intake/Output Summary (Last 24 hours) at 02/24/16 1208 Last data filed at 02/24/16 1100  Gross per 24 hour  Intake          2151.72 ml  Output             2300 ml  Net          -148.28 ml   Filed Weights   02/21/16 2026 02/22/16 0300  02/23/16 0250  Weight: 99.9 kg (220 lb 3.8 oz) 100.9 kg (222 lb 7.1 oz) 101.6 kg (223 lb 15.8 oz)    Examination: General: No acute respiratory distress - alert - mildly confused  Lungs: Clear to auscultation bilaterally w/o wheezing  Cardiovascular: rate < 100 consistently - irreg irreg - no appreciable M Abdomen: Nontender, nondistended, soft, bowel sounds positive, no rebound Extremities: No significant cyanosis, clubbing, or edema bilateral lower extremities Neuro:  5/5 strength B LE - intact sensation to touch B LE - no babinski B  CBC:  Recent Labs Lab 02/21/16 0612 02/21/16 0633 02/21/16 2118 02/22/16 1054 02/23/16 0326 02/24/16 0139  WBC 11.0*  --  9.7 10.5 14.3* 13.3*  NEUTROABS 8.6*  --  7.5 8.4*  --   --   HGB 13.7 13.3  12.2* 11.5* 12.0* 12.3*  HCT 38.9* 39.0 35.2* 33.2* 34.6* 36.1*  MCV 91.5  --  92.6 92.2 92.0 92.1  PLT 136*  --  125* 130* 141* [id number]   Basic Metabolic Panel:  Recent Labs Lab 02/21/16 1836 02/21/16 2119 02/22/16 0120 02/22/16 0723 02/23/16 0326 02/24/16 0139  NA  --  134* 136 137 134* 133*  K  --  3.8 3.8 4.8 3.0* 3.3*  CL  --  102 106 107 103 105  CO2  --  20* 21* 21* 20* 20*  GLUCOSE  --  113* 106* 97 124* 129*  BUN  --  20 18 19 17 20   CREATININE  --  0.90 0.83 1.08 0.87 0.82  CALCIUM  --  8.3* 8.2* 7.9* 8.0* 7.6*  MG 1.8  --   --   --   --  2.4  PHOS 3.0  --   --   --   --   --    GFR: Estimated Creatinine Clearance: 103.5 mL/min (by C-G formula based on SCr of 0.82 mg/dL).  Liver Function Tests:  Recent Labs Lab 02/21/16 0612 02/23/16 0326 02/24/16 0139  AST 30 35 60*  ALT 26 28 49  ALKPHOS 58 50 57  BILITOT 1.0 1.0 0.6  PROT 6.4* 5.5* 5.4*  ALBUMIN 3.0* 2.2* 2.0*    Recent Labs Lab 02/21/16 1836  LIPASE 16    Coagulation Profile:  Recent Labs Lab 02/21/16 0647  INR 1.26    Cardiac Enzymes:  Recent Labs Lab 02/21/16 1836 02/21/16 2118 02/21/16 2332 02/22/16 0723  TROPONINI 0.06* 0.03* 0.08* <0.03    Recent Results (from the past 240 hour(s))  Blood Culture (routine x 2)     Status: None (Preliminary result)   Collection Time: 02/21/16  6:09 AM  Result Value Ref Range Status   Specimen Description BLOOD RIGHT ARM  Final   Special Requests BOTTLES DRAWN AEROBIC AND ANAEROBIC 5ML  Final   Culture  Setup Time   Final    GRAM POSITIVE COCCI IN CHAINS IN BOTH AEROBIC AND ANAEROBIC BOTTLES CRITICAL RESULT CALLED TO, READ BACK BY AND VERIFIED WITH: E MARTIN,PHARMD AT 1600 02/22/16 BY J FUDESCO    Culture   Final    GRAM POSITIVE COCCI CULTURE REINCUBATED FOR BETTER GROWTH    Report Status PENDING  Incomplete  Blood Culture ID Panel (Reflexed)     Status: Abnormal   Collection Time: 02/21/16  6:09 AM  Result Value Ref Range Status    Enterococcus species NOT DETECTED NOT DETECTED Final   Listeria monocytogenes NOT DETECTED NOT DETECTED Final   Staphylococcus species NOT DETECTED NOT DETECTED Final   Staphylococcus aureus  NOT DETECTED NOT DETECTED Final   Streptococcus species DETECTED (A) NOT DETECTED Final    Comment: Not Enterococcus species, Streptococcus agalactiae, Streptococcus pyogenes, or Streptococcus pneumoniae. CRITICAL RESULT CALLED TO, READ BACK BY AND VERIFIED WITH: E. MARTIN, 02/22/16 AT 1600 BY J FUDESCO    Streptococcus agalactiae NOT DETECTED NOT DETECTED Final   Streptococcus pneumoniae NOT DETECTED NOT DETECTED Final   Streptococcus pyogenes NOT DETECTED NOT DETECTED Final   Acinetobacter baumannii NOT DETECTED NOT DETECTED Final   Enterobacteriaceae species NOT DETECTED NOT DETECTED Final   Enterobacter cloacae complex NOT DETECTED NOT DETECTED Final   Escherichia coli NOT DETECTED NOT DETECTED Final   Klebsiella oxytoca NOT DETECTED NOT DETECTED Final   Klebsiella pneumoniae NOT DETECTED NOT DETECTED Final   Proteus species NOT DETECTED NOT DETECTED Final   Serratia marcescens NOT DETECTED NOT DETECTED Final   Haemophilus influenzae NOT DETECTED NOT DETECTED Final   Neisseria meningitidis NOT DETECTED NOT DETECTED Final   Pseudomonas aeruginosa NOT DETECTED NOT DETECTED Final   Candida albicans NOT DETECTED NOT DETECTED Final   Candida glabrata NOT DETECTED NOT DETECTED Final   Candida krusei NOT DETECTED NOT DETECTED Final   Candida parapsilosis NOT DETECTED NOT DETECTED Final   Candida tropicalis NOT DETECTED NOT DETECTED Final  Blood Culture (routine x 2)     Status: None (Preliminary result)   Collection Time: 02/21/16  6:17 AM  Result Value Ref Range Status   Specimen Description BLOOD LEFT ARM  Final   Special Requests BOTTLES DRAWN AEROBIC AND ANAEROBIC 5ML  Final   Culture  Setup Time   Final    GRAM POSITIVE COCCI IN CHAINS ANAEROBIC BOTTLE ONLY CRITICAL VALUE NOTED.  VALUE IS  CONSISTENT WITH PREVIOUSLY REPORTED AND CALLED VALUE.    Culture   Final    GRAM POSITIVE COCCI CULTURE REINCUBATED FOR BETTER GROWTH    Report Status PENDING  Incomplete  Urine culture     Status: Abnormal   Collection Time: 02/21/16  6:26 AM  Result Value Ref Range Status   Specimen Description URINE, RANDOM  Final   Special Requests NONE  Final   Culture <10,000 COLONIES/mL INSIGNIFICANT GROWTH (A)  Final   Report Status 02/22/2016 FINAL  Final  Respiratory Panel by PCR     Status: None   Collection Time: 02/21/16  1:20 PM  Result Value Ref Range Status   Adenovirus NOT DETECTED NOT DETECTED Final   Coronavirus 229E NOT DETECTED NOT DETECTED Final   Coronavirus HKU1 NOT DETECTED NOT DETECTED Final   Coronavirus NL63 NOT DETECTED NOT DETECTED Final   Coronavirus OC43 NOT DETECTED NOT DETECTED Final   Metapneumovirus NOT DETECTED NOT DETECTED Final   Rhinovirus / Enterovirus NOT DETECTED NOT DETECTED Final   Influenza A NOT DETECTED NOT DETECTED Final   Influenza B NOT DETECTED NOT DETECTED Final   Parainfluenza Virus 1 NOT DETECTED NOT DETECTED Final   Parainfluenza Virus 2 NOT DETECTED NOT DETECTED Final   Parainfluenza Virus 3 NOT DETECTED NOT DETECTED Final   Parainfluenza Virus 4 NOT DETECTED NOT DETECTED Final   Respiratory Syncytial Virus NOT DETECTED NOT DETECTED Final   Bordetella pertussis NOT DETECTED NOT DETECTED Final   Chlamydophila pneumoniae NOT DETECTED NOT DETECTED Final   Mycoplasma pneumoniae NOT DETECTED NOT DETECTED Final  MRSA PCR Screening     Status: None   Collection Time: 02/21/16  8:43 PM  Result Value Ref Range Status   MRSA by PCR NEGATIVE NEGATIVE Final  Comment:        The GeneXpert MRSA Assay (FDA approved for NASAL specimens only), is one component of a comprehensive MRSA colonization surveillance program. It is not intended to diagnose MRSA infection nor to guide or monitor treatment for MRSA infections.   C difficile quick scan w  PCR reflex     Status: None   Collection Time: 02/22/16  9:31 PM  Result Value Ref Range Status   C Diff antigen NEGATIVE NEGATIVE Final   C Diff toxin NEGATIVE NEGATIVE Final   C Diff interpretation No C. difficile detected.  Final     Scheduled Meds: . atorvastatin  40 mg Oral Daily  . cefTRIAXone (ROCEPHIN)  IV  2 g Intravenous Q24H  . folic acid  1 mg Oral Daily  . lidocaine  2 patch Transdermal Q24H  . lisinopril  20 mg Oral Daily  . mouth rinse  15 mL Mouth Rinse BID  . metoprolol  10 mg Intravenous Q6H  . multivitamin with minerals  1 tablet Oral Daily  . pantoprazole  40 mg Oral Q1200  . [START ON 02/25/2016] pneumococcal 23 valent vaccine  0.5 mL Intramuscular Tomorrow-1000  . potassium chloride  40 mEq Oral TID  . thiamine  100 mg Oral Daily   Continuous Infusions: . sodium chloride 30 mL/hr at 02/24/16 0859  . diltiazem (CARDIZEM) infusion 15 mg/hr (02/24/16 0458)  . heparin 2,150 Units/hr (02/24/16 0255)     LOS: 3 days   Cherene Altes, MD [facility] Office  [phone] Pager - Text Page per Amion as per below:  On-Call/Text Page:      [email or web address]      password [email or web address]  If 7PM-7AM, please contact night-coverage [email or web address] Password [email or web address] 02/24/2016, 12:08 PM

## 2016-02-24 NOTE — Progress Notes (Signed)
Subjective:  No new complaints.    Antibiotics:  Anti-infectives    Start     Dose/Rate Route Frequency Ordered Stop   02/22/16 2130  vancomycin (VANCOCIN) 1,250 mg in sodium chloride 0.9 % 250 mL IVPB  Status:  Discontinued     1,250 mg 166.7 mL/hr over 90 Minutes Intravenous Every 12 hours 02/22/16 1229 02/22/16 1626   02/22/16 1800  cefTRIAXone (ROCEPHIN) 2 g in dextrose 5 % 50 mL IVPB     2 g 100 mL/hr over 30 Minutes Intravenous Every 24 hours 02/22/16 1626     02/21/16 2200  oseltamivir (TAMIFLU) capsule 75 mg  Status:  Discontinued     75 mg Oral 2 times daily 02/21/16 1516 02/22/16 1407   02/21/16 1800  vancomycin (VANCOCIN) IVPB 1000 mg/200 mL premix  Status:  Discontinued     1,000 mg 200 mL/hr over 60 Minutes Intravenous Every 8 hours 02/21/16 1138 02/22/16 1229   02/21/16 1800  piperacillin-tazobactam (ZOSYN) IVPB 3.375 g  Status:  Discontinued     3.375 g 12.5 mL/hr over 240 Minutes Intravenous Every 8 hours 02/21/16 1138 02/22/16 1626   02/21/16 1015  piperacillin-tazobactam (ZOSYN) IVPB 3.375 g     3.375 g 100 mL/hr over 30 Minutes Intravenous  Once 02/21/16 1002 02/21/16 1100   02/21/16 1015  vancomycin (VANCOCIN) IVPB 1000 mg/200 mL premix     1,000 mg 200 mL/hr over 60 Minutes Intravenous  Once 02/21/16 1002 02/21/16 1132   02/21/16 0730  oseltamivir (TAMIFLU) capsule 75 mg     75 mg Oral  Once 02/21/16 0721 02/21/16 0802      Medications: Scheduled Meds: . atorvastatin  40 mg Oral Daily  . cefTRIAXone (ROCEPHIN)  IV  2 g Intravenous Q24H  . folic acid  1 mg Oral Daily  . lidocaine  2 patch Transdermal Q24H  . lisinopril  20 mg Oral Daily  . mouth rinse  15 mL Mouth Rinse BID  . metoprolol  10 mg Intravenous Q6H  . multivitamin with minerals  1 tablet Oral Daily  . pantoprazole  40 mg Oral Q1200  . [START ON 02/25/2016] pneumococcal 23 valent vaccine  0.5 mL Intramuscular Tomorrow-1000  . potassium chloride  40 mEq Oral TID  . thiamine  100  mg Oral Daily   Continuous Infusions: . sodium chloride 30 mL/hr at 02/24/16 0859  . diltiazem (CARDIZEM) infusion 10 mg/hr (02/24/16 1302)  . heparin 2,300 Units/hr (02/24/16 1304)   PRN Meds:.acetaminophen **OR** acetaminophen, alum & mag hydroxide-simeth, gi cocktail, haloperidol lactate, hydrALAZINE, ketorolac, Melatonin, metoprolol, ondansetron **OR** ondansetron (ZOFRAN) IV, polyethylene glycol    Objective: Weight change:   Intake/Output Summary (Last 24 hours) at 02/24/16 1331 Last data filed at 02/24/16 1304  Gross per 24 hour  Intake          1794.22 ml  Output             2300 ml  Net          -505.78 ml   Blood pressure 118/69, pulse 78, temperature 98.3 F (36.8 C), temperature source Oral, resp. rate (!) 21, height 6\' 1"  (1.854 m), weight 223 lb 15.8 oz (101.6 kg), SpO2 95 %. Temp:  [98.3 F (36.8 C)-99.5 F (37.5 C)] 98.3 F (36.8 C) (02/05 1100) Pulse Rate:  [78-106] 78 (02/05 1300) Resp:  [18-29] 21 (02/05 1300) BP: (99-138)/(69-81) 118/69 (02/05 1300) SpO2:  [95 %-99 %] 95 % (02/05 1300)  Physical Exam: General: Alert and awake, oriented x3, not in any acute distress. HEENT: anicteric sclera,EOMI CVS regular rate, normal r,  no murmur rubs or gallops Chest: clear to auscultation bilaterally, no wheezing, rales or rhonchi Abdomen: soft nontender, nondistended, normal bowel sounds, Extremities: no  clubbing or edema noted bilaterally Skin: no rashes, he has heat pad near LS area Neuro: nonfocal  CBC:  CBC Latest Ref Rng & Units 02/24/2016 02/23/2016 02/22/2016  WBC 4.0 - 10.5 K/uL 13.3(H) 14.3(H) 10.5  Hemoglobin 13.0 - 17.0 g/dL 12.3(L) 12.0(L) 11.5(L)  Hematocrit 39.0 - 52.0 % 36.1(L) 34.6(L) 33.2(L)  Platelets 150 - 400 K/uL 183 141(L) 130(L)      BMET  Recent Labs  02/23/16 0326 02/24/16 0139  NA 134* 133*  K 3.0* 3.3*  CL 103 105  CO2 20* 20*  GLUCOSE 124* 129*  BUN 17 20  CREATININE 0.87 0.82  CALCIUM 8.0* 7.6*     Liver  Panel   Recent Labs  02/23/16 0326 02/24/16 0139  PROT 5.5* 5.4*  ALBUMIN 2.2* 2.0*  AST 35 60*  ALT 28 49  ALKPHOS 50 57  BILITOT 1.0 0.6       Sedimentation Rate No results for input(s): ESRSEDRATE in the last 72 hours. C-Reactive Protein No results for input(s): CRP in the last 72 hours.  Micro Results: Recent Results (from the past 720 hour(s))  Blood Culture (routine x 2)     Status: None (Preliminary result)   Collection Time: 02/21/16  6:09 AM  Result Value Ref Range Status   Specimen Description BLOOD RIGHT ARM  Final   Special Requests BOTTLES DRAWN AEROBIC AND ANAEROBIC 5ML  Final   Culture  Setup Time   Final    GRAM POSITIVE COCCI IN CHAINS IN BOTH AEROBIC AND ANAEROBIC BOTTLES CRITICAL RESULT CALLED TO, READ BACK BY AND VERIFIED WITH: E MARTIN,PHARMD AT 1600 02/22/16 BY J FUDESCO    Culture   Final    GRAM POSITIVE COCCI CULTURE REINCUBATED FOR BETTER GROWTH    Report Status PENDING  Incomplete  Blood Culture ID Panel (Reflexed)     Status: Abnormal   Collection Time: 02/21/16  6:09 AM  Result Value Ref Range Status   Enterococcus species NOT DETECTED NOT DETECTED Final   Listeria monocytogenes NOT DETECTED NOT DETECTED Final   Staphylococcus species NOT DETECTED NOT DETECTED Final   Staphylococcus aureus NOT DETECTED NOT DETECTED Final   Streptococcus species DETECTED (A) NOT DETECTED Final    Comment: Not Enterococcus species, Streptococcus agalactiae, Streptococcus pyogenes, or Streptococcus pneumoniae. CRITICAL RESULT CALLED TO, READ BACK BY AND VERIFIED WITH: E. MARTIN, 02/22/16 AT 1600 BY J FUDESCO    Streptococcus agalactiae NOT DETECTED NOT DETECTED Final   Streptococcus pneumoniae NOT DETECTED NOT DETECTED Final   Streptococcus pyogenes NOT DETECTED NOT DETECTED Final   Acinetobacter baumannii NOT DETECTED NOT DETECTED Final   Enterobacteriaceae species NOT DETECTED NOT DETECTED Final   Enterobacter cloacae complex NOT DETECTED NOT DETECTED  Final   Escherichia coli NOT DETECTED NOT DETECTED Final   Klebsiella oxytoca NOT DETECTED NOT DETECTED Final   Klebsiella pneumoniae NOT DETECTED NOT DETECTED Final   Proteus species NOT DETECTED NOT DETECTED Final   Serratia marcescens NOT DETECTED NOT DETECTED Final   Haemophilus influenzae NOT DETECTED NOT DETECTED Final   Neisseria meningitidis NOT DETECTED NOT DETECTED Final   Pseudomonas aeruginosa NOT DETECTED NOT DETECTED Final   Candida albicans NOT DETECTED NOT DETECTED Final   Candida glabrata NOT  DETECTED NOT DETECTED Final   Candida krusei NOT DETECTED NOT DETECTED Final   Candida parapsilosis NOT DETECTED NOT DETECTED Final   Candida tropicalis NOT DETECTED NOT DETECTED Final  Blood Culture (routine x 2)     Status: None (Preliminary result)   Collection Time: 02/21/16  6:17 AM  Result Value Ref Range Status   Specimen Description BLOOD LEFT ARM  Final   Special Requests BOTTLES DRAWN AEROBIC AND ANAEROBIC 5ML  Final   Culture  Setup Time   Final    GRAM POSITIVE COCCI IN CHAINS ANAEROBIC BOTTLE ONLY CRITICAL VALUE NOTED.  VALUE IS CONSISTENT WITH PREVIOUSLY REPORTED AND CALLED VALUE.    Culture   Final    GRAM POSITIVE COCCI CULTURE REINCUBATED FOR BETTER GROWTH    Report Status PENDING  Incomplete  Urine culture     Status: Abnormal   Collection Time: 02/21/16  6:26 AM  Result Value Ref Range Status   Specimen Description URINE, RANDOM  Final   Special Requests NONE  Final   Culture <10,000 COLONIES/mL INSIGNIFICANT GROWTH (A)  Final   Report Status 02/22/2016 FINAL  Final  Respiratory Panel by PCR     Status: None   Collection Time: 02/21/16  1:20 PM  Result Value Ref Range Status   Adenovirus NOT DETECTED NOT DETECTED Final   Coronavirus 229E NOT DETECTED NOT DETECTED Final   Coronavirus HKU1 NOT DETECTED NOT DETECTED Final   Coronavirus NL63 NOT DETECTED NOT DETECTED Final   Coronavirus OC43 NOT DETECTED NOT DETECTED Final   Metapneumovirus NOT DETECTED  NOT DETECTED Final   Rhinovirus / Enterovirus NOT DETECTED NOT DETECTED Final   Influenza A NOT DETECTED NOT DETECTED Final   Influenza B NOT DETECTED NOT DETECTED Final   Parainfluenza Virus 1 NOT DETECTED NOT DETECTED Final   Parainfluenza Virus 2 NOT DETECTED NOT DETECTED Final   Parainfluenza Virus 3 NOT DETECTED NOT DETECTED Final   Parainfluenza Virus 4 NOT DETECTED NOT DETECTED Final   Respiratory Syncytial Virus NOT DETECTED NOT DETECTED Final   Bordetella pertussis NOT DETECTED NOT DETECTED Final   Chlamydophila pneumoniae NOT DETECTED NOT DETECTED Final   Mycoplasma pneumoniae NOT DETECTED NOT DETECTED Final  MRSA PCR Screening     Status: None   Collection Time: 02/21/16  8:43 PM  Result Value Ref Range Status   MRSA by PCR NEGATIVE NEGATIVE Final    Comment:        The GeneXpert MRSA Assay (FDA approved for NASAL specimens only), is one component of a comprehensive MRSA colonization surveillance program. It is not intended to diagnose MRSA infection nor to guide or monitor treatment for MRSA infections.   C difficile quick scan w PCR reflex     Status: None   Collection Time: 02/22/16  9:31 PM  Result Value Ref Range Status   C Diff antigen NEGATIVE NEGATIVE Final   C Diff toxin NEGATIVE NEGATIVE Final   C Diff interpretation No C. difficile detected.  Final    Studies/Results: Dg Abd Portable 1v  Result Date: 02/23/2016 CLINICAL DATA:  Rule out ileus. EXAM: PORTABLE ABDOMEN - 1 VIEW COMPARISON:  February 21, 2016 FINDINGS: Gastric distention. The large and small bowel are normal in caliber. No other abnormalities. IMPRESSION: Gastric distention. The large and small bowel are normal in caliber. Electronically Signed   By: Dorise Bullion III M.D   On: 02/23/2016 08:56      Assessment/Plan:  INTERVAL HISTORY: MRi was sent to Radiology--no read  Available in Epic   Principal Problem:   Sepsis ([place]) Active Problems:   Hyperlipidemia   CAD (coronary artery  disease), native coronary artery   Essential hypertension   Hyponatremia   Atrial fibrillation with RVR (H[facility])   Back pain   Acute encephalopathy    Jonathan Holden is a 71 y.o. male with  Streptococcal bacteremia and diskitis L4-L5, L5-S1 with diskitis,   --we are still awaiting formal read by Radiology and who is going to perform surgery ---Patient himself is asking for a Neurosurgeon to perform this ---continue ceftriaxone   LOS: 3 days   Alcide Evener 02/24/2016, 1:31 PM

## 2016-02-24 NOTE — Care Management Note (Addendum)
Case Management Note  Patient Details  Name: Jonathan Holden MRN: [id number] Date of Birth: [birth year]  Subjective/Objective:  Presents with sepsis, lower back pain, elevated trops, afib with rvr, hallucination, on cardizem drip and heparin drip, MRI ? Abscess.  He lives with girlfriend.  NCM will cont to follow for dc needs.    2/8 [place], BSN -   patient with Streptococcal bacteremia and diskitis L4-L5, L5-S1, Dr. Christella Noa has been consulted.  Per ID he will need 6 weeks of pencillian iv abx, he will get a picc line and have  A TEE.  He will need to go to snf for iv abx, has hx of psa. Per CIR screen is rec CIR consult.  Order in.  NCM will cont to follow for dc needs..                   Action/Plan:   Expected Discharge Date:                  Expected Discharge Plan:  Home/Self Care  In-House Referral:     Discharge planning Services  CM Consult  Post Acute Care Choice:    Choice offered to:     DME Arranged:    DME Agency:     HH Arranged:    HH Agency:     Status of Service:  In process, will continue to follow  If discussed at Long Length of Stay Meetings, dates discussed:    Additional Comments:  Zenon Mayo, RN 02/24/2016, 3:36 PM

## 2016-02-24 NOTE — Progress Notes (Signed)
ANTICOAGULATION CONSULT NOTE - Follow Up Consult  Pharmacy Consult for Heparin  Indication: atrial fibrillation  Allergies  Allergen Reactions  . Other Other (See Comments)    Pt reports allergic reaction to melons - unknown reaction  . Ciprofloxacin Hives   Patient Measurements: Height: 6\' 1"  (185.4 cm) Weight: 223 lb 15.8 oz (101.6 kg) IBW/kg (Calculated) : 79.9  Vital Signs: Temp: 98.9 F (37.2 C) (02/05 1900) Temp Source: Oral (02/05 1900) BP: 113/68 (02/05 1900) Pulse Rate: 102 (02/05 1900)  Labs:  Recent Labs  02/21/16 2332  02/22/16 0723  02/22/16 1054  02/23/16 0326  02/24/16 0137 02/24/16 0139 02/24/16 1151 02/24/16 2209  HGB  --   --   --   < > 11.5*  --  12.0*  --   --  12.3*  --   --   HCT  --   --   --   --  33.2*  --  34.6*  --   --  36.1*  --   --   PLT  --   --   --   --  130*  --  141*  --   --  183  --   --   HEPARINUNFRC  --   --   --   --   --   < >  --   < > 0.18*  --  0.27* 0.45  CREATININE  --   < > 1.08  --   --   --  0.87  --   --  0.82  --   --   TROPONINI 0.08*  --  <0.03  --   --   --   --   --   --   --   --   --   < > = values in this interval not displayed.  Estimated Creatinine Clearance: 103.5 mL/min (by C-G formula based on SCr of 0.82 mg/dL).   Assessment: Heparin for atrial fibrillation. Heparin level of 0.4 therapeutic on heparin infusion at 2300 units/hr.    Goal of Therapy:  Heparin level 0.3-0.7 units/ml Monitor platelets by anticoagulation protocol: Yes   Plan:  -Continue infusion at 2300 units/hr -f/u long term a/c plans   Erin Hearing PharmD., BCPS Clinical Pharmacist Pager [phone] 02/24/2016 10:59 PM

## 2016-02-24 NOTE — Consult Note (Signed)
Reason for Consult: SVT/ a fib   Referring Physician: Dr. Thereasa Solo   PCP:  Jonathan Grooms, MD  Primary Cardiologist:Dr. Ermon Holden is an 71 y.o. male.    Chief Complaint: admitted 02/21/16 with AMS and was in SVT   HPI:  [name] with hx of CAD including remote inf MI with PTCA, HTN and HLD.  + HTN.  Pt admitted with AMS, recent back pain, and HNP at L3-L4 and a prior laminotomy at L4-L5-S1 per outpt MD.  Pt with sepsis and + SVT/ a fib.    First EKG ST, second EKG with SVT though it appears a flutter.  3rd EKG with a flutter at 160  Then HR slowed and rest of EKGs with a fib with PVCs and t wave inversions in inf leads are old.   tropoin 0.06; 0.03; 0.08; < 0.03.  Today hypokalemic  WBC is elevated.    ID is following Streptococcal bacteremia, on ABX. CHA2DS2-VASc is at least 2 > heparin gtt  Echo 02/21/16  EF 55-60%  Difficult study.  MRI of back with possible diskitis, awaiting read over on MRI.  May need another MRI.   Today he denies any chest pain, and none prior to admit.  His back pain is his problem per pt.  HR is controlled in a fib on IV dilt.  He is able to lie flat without SOB.  Though respirations elevated to 24.  sp02 on RA is normal.  He has freq PVCs as well.  K+ today 3.3  Being replaced.   Na 133.  He is receiving IV lopressor 10 mg IV every 6 hours.  Pt also rec'd tamiflu though respiratory panel was negative.     Past Medical History:  Diagnosis Date  . Arthritis HANDS AND RIGHT SHOULDER  . Coronary artery disease CARDIOLOGIST- DR Daneen Schick- LAST VISIT 6 MON AGO--  REQUESTED NOTE, EKG, ECHO, STRESS TEST   PT DENIES S & S  . H/O gastric ulcer   . History of MI (myocardial infarction) 1992  . Hyperlipidemia   . Hypertension   . Loose body of right shoulder   . S/P coronary angioplasty     Past Surgical History:  Procedure Laterality Date  . CARDIOVASCULAR STRESS TEST  08-22-2008---  DR Daneen Schick   NO ISCHEMIA.   Marland Kitchen CORONARY  ANGIOPLASTY  1992  . ERCP W/ SPHICTEROTOMY  12-15-2002   AND STONE EXTRACTION  . LAPAROSCOPIC CHOLECYSTECTOMY  12-16-2002  . LUMBAR LAMINECTOMY  20 YRS AGO   L5    Family History  Problem Relation Age of Onset  . Other Mother     AGE 44 HEALTHY  . Heart attack Father 71    2 MI  . Emphysema    . Other Brother     HEALTHY   Social History:  reports that he quit smoking about 25 years ago. His smoking use included Cigarettes. He has a 40.00 pack-year smoking history. He has never used smokeless tobacco. He reports that he drinks about 4.2 oz of alcohol per week . He reports that he does not use drugs.  Allergies:  Allergies  Allergen Reactions  . Other Other (See Comments)    Pt reports allergic reaction to melons - unknown reaction  . Ciprofloxacin Hives    OUTPATIENT MEDICATIONS: No current facility-administered medications on file prior to encounter.    Current Outpatient Prescriptions on File Prior to  Encounter  Medication Sig Dispense Refill  . Alirocumab (PRALUENT) 150 MG/ML SOPN Inject 150 mg into the skin every 14 (fourteen) days. 2 pen 11  . lisinopril-hydrochlorothiazide (PRINZIDE,ZESTORETIC) 20-12.5 MG tablet Take 1 tablet by mouth daily. Patient is overdue for an appointment. Please call and schedule for further refills 90 tablet 0  . methylPREDNISolone (MEDROL DOSEPAK) 4 MG TBPK tablet Take as directed on packet (Patient taking differently: Take 4 mg by mouth See admin instructions. Take as directed on package - tapered course) 21 tablet 0  . HYDROcodone-acetaminophen (NORCO/VICODIN) 5-325 MG tablet Take 1-2 tablets by mouth every 6 (six) hours as needed. (Patient not taking: Reported on 02/21/2016) 10 tablet 0  . metoprolol succinate (TOPROL XL) 25 MG 24 hr tablet Take 1 tablet (25 mg total) by mouth daily. (Patient not taking: Reported on 02/21/2016) 90 tablet 1   CURRENT MEDICATIONS: Scheduled Meds: . Alirocumab  150 mg Subcutaneous Q14 Days  . atorvastatin  40 mg  Oral Daily  . cefTRIAXone (ROCEPHIN)  IV  2 g Intravenous Q24H  . folic acid  1 mg Oral Daily  . lidocaine  2 patch Transdermal Q24H  . lisinopril  20 mg Oral Daily  . mouth rinse  15 mL Mouth Rinse BID  . metoprolol  10 mg Intravenous Q6H  . multivitamin with minerals  1 tablet Oral Daily  . pantoprazole  40 mg Oral Q1200  . [START ON 02/25/2016] pneumococcal 23 valent vaccine  0.5 mL Intramuscular Tomorrow-1000  . potassium chloride  40 mEq Oral TID  . thiamine  100 mg Oral Daily   Continuous Infusions: . sodium chloride 10 mL/hr at 02/24/16 1344  . diltiazem (CARDIZEM) infusion 10 mg/hr (02/24/16 1344)  . heparin 2,300 Units/hr (02/24/16 1304)   PRN Meds:.acetaminophen **OR** acetaminophen, alum & mag hydroxide-simeth, gi cocktail, haloperidol lactate, hydrALAZINE, ketorolac, loperamide, Melatonin, metoprolol, ondansetron **OR** ondansetron (ZOFRAN) IV, polyethylene glycol   Results for orders placed or performed during the hospital encounter of 02/21/16 (from the past 48 hour(s))  Lactoferrin, Fecal,Qualitative     Status: None   Collection Time: 02/22/16  9:31 PM  Result Value Ref Range   Lactoferrin, Fecal, Qual NEGATIVE NEGATIVE  C difficile quick scan w PCR reflex     Status: None   Collection Time: 02/22/16  9:31 PM  Result Value Ref Range   C Diff antigen NEGATIVE NEGATIVE   C Diff toxin NEGATIVE NEGATIVE   C Diff interpretation No C. difficile detected.   Folate     Status: None   Collection Time: 02/23/16  3:25 AM  Result Value Ref Range   Folate 17.4 >5.9 ng/mL  Heparin level (unfractionated)     Status: Abnormal   Collection Time: 02/23/16  3:25 AM  Result Value Ref Range   Heparin Unfractionated 0.12 (L) 0.30 - 0.70 IU/mL    Comment:        IF HEPARIN RESULTS ARE BELOW EXPECTED VALUES, AND PATIENT DOSAGE HAS BEEN CONFIRMED, SUGGEST FOLLOW UP TESTING OF ANTITHROMBIN III LEVELS.   CBC     Status: Abnormal   Collection Time: 02/23/16  3:26 AM  Result Value  Ref Range   WBC 14.3 (H) 4.0 - 10.5 K/uL   RBC 3.76 (L) 4.22 - 5.81 MIL/uL   Hemoglobin 12.0 (L) 13.0 - 17.0 g/dL   HCT 34.6 (L) 39.0 - 52.0 %   MCV 92.0 78.0 - 100.0 fL   MCH 31.9 26.0 - 34.0 pg   MCHC 34.7 30.0 - 36.0  g/dL   RDW 14.6 11.5 - 15.5 %   Platelets 141 (L) 150 - 400 K/uL  Comprehensive metabolic panel     Status: Abnormal   Collection Time: 02/23/16  3:26 AM  Result Value Ref Range   Sodium 134 (L) 135 - 145 mmol/L   Potassium 3.0 (L) 3.5 - 5.1 mmol/L    Comment: DELTA CHECK NOTED   Chloride 103 101 - 111 mmol/L   CO2 20 (L) 22 - 32 mmol/L   Glucose, Bld 124 (H) 65 - 99 mg/dL   BUN 17 6 - 20 mg/dL   Creatinine, Ser 0.87 0.61 - 1.24 mg/dL   Calcium 8.0 (L) 8.9 - 10.3 mg/dL   Total Protein 5.5 (L) 6.5 - 8.1 g/dL   Albumin 2.2 (L) 3.5 - 5.0 g/dL   AST 35 15 - 41 U/L   ALT 28 17 - 63 U/L   Alkaline Phosphatase 50 38 - 126 U/L   Total Bilirubin 1.0 0.3 - 1.2 mg/dL   GFR calc non Af Amer >60 >60 mL/min   GFR calc Af Amer >60 >60 mL/min    Comment: (NOTE) The eGFR has been calculated using the CKD EPI equation. This calculation has not been validated in all clinical situations. eGFR's persistently <60 mL/min signify possible Chronic Kidney Disease.    Anion gap 11 5 - 15  Vitamin B12     Status: None   Collection Time: 02/23/16  3:26 AM  Result Value Ref Range   Vitamin B-12 591 180 - 914 pg/mL    Comment: (NOTE) This assay is not validated for testing neonatal or myeloproliferative syndrome specimens for Vitamin B12 levels.   Heparin level (unfractionated)     Status: Abnormal   Collection Time: 02/23/16 12:42 PM  Result Value Ref Range   Heparin Unfractionated <0.10 (L) 0.30 - 0.70 IU/mL    Comment:        IF HEPARIN RESULTS ARE BELOW EXPECTED VALUES, AND PATIENT DOSAGE HAS BEEN CONFIRMED, SUGGEST FOLLOW UP TESTING OF ANTITHROMBIN III LEVELS.   Heparin level (unfractionated)     Status: Abnormal   Collection Time: 02/23/16  8:27 PM  Result Value Ref  Range   Heparin Unfractionated 0.19 (L) 0.30 - 0.70 IU/mL    Comment:        IF HEPARIN RESULTS ARE BELOW EXPECTED VALUES, AND PATIENT DOSAGE HAS BEEN CONFIRMED, SUGGEST FOLLOW UP TESTING OF ANTITHROMBIN III LEVELS.   Heparin level (unfractionated)     Status: Abnormal   Collection Time: 02/24/16  1:37 AM  Result Value Ref Range   Heparin Unfractionated 0.18 (L) 0.30 - 0.70 IU/mL    Comment:        IF HEPARIN RESULTS ARE BELOW EXPECTED VALUES, AND PATIENT DOSAGE HAS BEEN CONFIRMED, SUGGEST FOLLOW UP TESTING OF ANTITHROMBIN III LEVELS.   CBC     Status: Abnormal   Collection Time: 02/24/16  1:39 AM  Result Value Ref Range   WBC 13.3 (H) 4.0 - 10.5 K/uL   RBC 3.92 (L) 4.22 - 5.81 MIL/uL   Hemoglobin 12.3 (L) 13.0 - 17.0 g/dL   HCT 36.1 (L) 39.0 - 52.0 %   MCV 92.1 78.0 - 100.0 fL   MCH 31.4 26.0 - 34.0 pg   MCHC 34.1 30.0 - 36.0 g/dL   RDW 15.0 11.5 - 15.5 %   Platelets 183 150 - 400 K/uL  Comprehensive metabolic panel     Status: Abnormal   Collection Time: 02/24/16  1:39 AM  Result Value Ref Range   Sodium 133 (L) 135 - 145 mmol/L   Potassium 3.3 (L) 3.5 - 5.1 mmol/L   Chloride 105 101 - 111 mmol/L   CO2 20 (L) 22 - 32 mmol/L   Glucose, Bld 129 (H) 65 - 99 mg/dL   BUN 20 6 - 20 mg/dL   Creatinine, Ser 0.82 0.61 - 1.24 mg/dL   Calcium 7.6 (L) 8.9 - 10.3 mg/dL   Total Protein 5.4 (L) 6.5 - 8.1 g/dL   Albumin 2.0 (L) 3.5 - 5.0 g/dL   AST 60 (H) 15 - 41 U/L   ALT 49 17 - 63 U/L   Alkaline Phosphatase 57 38 - 126 U/L   Total Bilirubin 0.6 0.3 - 1.2 mg/dL   GFR calc non Af Amer >60 >60 mL/min   GFR calc Af Amer >60 >60 mL/min    Comment: (NOTE) The eGFR has been calculated using the CKD EPI equation. This calculation has not been validated in all clinical situations. eGFR's persistently <60 mL/min signify possible Chronic Kidney Disease.    Anion gap 8 5 - 15  Magnesium     Status: None   Collection Time: 02/24/16  1:39 AM  Result Value Ref Range   Magnesium 2.4  1.7 - 2.4 mg/dL  Heparin level (unfractionated)     Status: Abnormal   Collection Time: 02/24/16 11:51 AM  Result Value Ref Range   Heparin Unfractionated 0.27 (L) 0.30 - 0.70 IU/mL    Comment:        IF HEPARIN RESULTS ARE BELOW EXPECTED VALUES, AND PATIENT DOSAGE HAS BEEN CONFIRMED, SUGGEST FOLLOW UP TESTING OF ANTITHROMBIN III LEVELS.    Dg Abd Portable 1v  Result Date: 02/23/2016 CLINICAL DATA:  Rule out ileus. EXAM: PORTABLE ABDOMEN - 1 VIEW COMPARISON:  February 21, 2016 FINDINGS: Gastric distention. The large and small bowel are normal in caliber. No other abnormalities. IMPRESSION: Gastric distention. The large and small bowel are normal in caliber. Electronically Signed   By: Dorise Bullion III M.D   On: 02/23/2016 08:56    ROS: General:no colds + fevers, + weight Increase Skin:no rashes or ulcers HEENT:no blurred vision, no congestion CV:see HPI PUL:see HPI GI:no diarrhea constipation or melena, no indigestion GU:no hematuria, no dysuria MS:no joint pain, no claudication, + back pain Neuro:no syncope, no lightheadedness Endo:no diabetes, no thyroid disease  Blood pressure 118/69, pulse 78, temperature 98.3 F (36.8 C), temperature source Oral, resp. rate (!) 21, height 6' 1" (1.854 m), weight 223 lb 15.8 oz (101.6 kg), SpO2 95 %.  Wt Readings from Last 3 Encounters:  02/23/16 223 lb 15.8 oz (101.6 kg)  02/17/16 210 lb (95.3 kg)  10/19/14 214 lb 12.8 oz (97.4 kg)    PE: General:Pleasant affect, NAD Skin:Warm and dry, brisk capillary refill HEENT:normocephalic, sclera clear, mucus membranes moist Neck:supple, mild JVD, no bruits  Heart:irreg irreg without murmur, gallup, rub or click Lungs: without rales, some rhonchi, occ wheezes YSA:YTKZ, non tender, + BS, do not palpate liver spleen or masses Ext:no lower ext edema, 2+ pedal pulses, 2+ radial pulses Neuro:alert and oriented X 3 with some confusion at times, MAE, follows commands, + facial  symmetry   Assessment/Plan Principal Problem:   Sepsis (HCC) Active Problems:   Hyperlipidemia   CAD (coronary artery disease), native coronary artery   Essential hypertension   Hyponatremia   Atrial fibrillation with RVR (HCC)   Back pain   Acute encephalopathy   Streptococcal bacteremia   Lumbar  discitis   [name] with RVR, now slower on IV dilt , IV heparin, IV lopressor.  BP stable will control a fib and may change to po dilt and BB.  Currently on IV heparin deciding is surgery on back is needed.    Once stable d/c with oral agents to control rate and po anticoagulation if ortho agrees depending on outcome of infection.   - change lopressor to 50 mg po BID.   - continue dilt drip and change to po if needed.    elevated troponin mild most likely due to sepsis and tachycardia.  No recent ischemic work up.    HTN  On ACE and   HLD- on statin  Sepsis, strepococcal bacteremia followed by ID   Hypokalemia - keep K+ to 4.0 , Mg was 1.8.   Jonathan Holden  Nurse Practitioner Certified [place] Pager [phone] or after 5pm or weekends call [phone] 02/24/2016, 2:57 PM  Attending Note:   The patient was seen and examined.  Agree with assessment and plan as noted above.  Changes made to the above note as needed.  Patient seen and independently examined with Jonathan Kicks, NP.   We discussed all aspects of the encounter. I agree with the assessment and plan as stated above.  1. Atrial fib:   Newly diagnosed.    Likely associated with his sepsis.  Rate is currently controlled   Agree with heparin for now.   He may need a vertebral disc bx.  OK to transition to PO metoprolol today .   Transition to PO Dilt soon ( he may not need it after ramping up the metoprolol )  Change to DOAC once we are through with procedures I would treat him with    I have spent a total of 40 minutes with patient reviewing hospital  notes , telemetry, EKGs, labs and examining  patient as well as establishing an assessment and plan that was discussed with the patient. > 50% of time was spent in direct patient care.    Thayer Headings, Brooke Bonito., MD, [facility] 02/24/2016, 4:26 PM [address],  [place] Pager [phone]

## 2016-02-24 NOTE — Progress Notes (Signed)
ANTICOAGULATION CONSULT NOTE - Follow Up Consult  Pharmacy Consult for Heparin  Indication: atrial fibrillation  Allergies  Allergen Reactions  . Other Other (See Comments)    Pt reports allergic reaction to melons - unknown reaction  . Ciprofloxacin Hives   Patient Measurements: Height: 6\' 1"  (185.4 cm) Weight: 223 lb 15.8 oz (101.6 kg) IBW/kg (Calculated) : 79.9  Vital Signs: Temp: 98.5 F (36.9 C) (02/05 0233) Temp Source: Oral (02/05 0233) BP: 119/76 (02/05 0233) Pulse Rate: 92 (02/05 0233)  Labs:  Recent Labs  02/21/16 0647  02/21/16 2118  02/21/16 2332 02/22/16 0120 02/22/16 0723 02/22/16 1054  02/23/16 0326 02/23/16 1242 02/23/16 2027 02/24/16 0137 02/24/16 0139  HGB  --   --  12.2*  --   --   --   --  11.5*  --  12.0*  --   --   --  12.3*  HCT  --   --  35.2*  --   --   --   --  33.2*  --  34.6*  --   --   --  36.1*  PLT  --   --  125*  --   --   --   --  130*  --  141*  --   --   --  183  LABPROT 15.8*  --   --   --   --   --   --   --   --   --   --   --   --   --   INR 1.26  --   --   --   --   --   --   --   --   --   --   --   --   --   HEPARINUNFRC  --   --   --   --   --   --   --   --   < >  --  <0.10* 0.19* 0.18*  --   CREATININE  --   < >  --   < >  --  0.83 1.08  --   --  0.87  --   --   --   --   TROPONINI  --   < > 0.03*  --  0.08*  --  <0.03  --   --   --   --   --   --   --   < > = values in this interval not displayed.  Estimated Creatinine Clearance: 97.6 mL/min (by C-G formula based on SCr of 0.87 mg/dL).   Assessment: Heparin for afib, heparin level low, drawn only 3 hours after rate change but has decreased so will increase rate some  Goal of Therapy:  Heparin level 0.3-0.7 units/ml Monitor platelets by anticoagulation protocol: Yes   Plan:  -Inc heparin to 2150 units/hr -1200 HL  Narda Bonds 02/24/2016,2:43 AM

## 2016-02-24 NOTE — Progress Notes (Signed)
ANTICOAGULATION CONSULT NOTE - Follow Up Consult  Pharmacy Consult for Heparin  Indication: atrial fibrillation  Allergies  Allergen Reactions  . Other Other (See Comments)    Pt reports allergic reaction to melons - unknown reaction  . Ciprofloxacin Hives   Patient Measurements: Height: 6\' 1"  (185.4 cm) Weight: 223 lb 15.8 oz (101.6 kg) IBW/kg (Calculated) : 79.9  Vital Signs: Temp: 98.3 F (36.8 C) (02/05 1100) Temp Source: Oral (02/05 1100) BP: 99/69 (02/05 1200) Pulse Rate: 79 (02/05 1200)  Labs:  Recent Labs  02/21/16 2118  02/21/16 2332  02/22/16 0723 02/22/16 1054  02/23/16 0326  02/23/16 2027 02/24/16 0137 02/24/16 0139 02/24/16 1151  HGB 12.2*  --   --   --   --  11.5*  --  12.0*  --   --   --  12.3*  --   HCT 35.2*  --   --   --   --  33.2*  --  34.6*  --   --   --  36.1*  --   PLT 125*  --   --   --   --  130*  --  141*  --   --   --  183  --   HEPARINUNFRC  --   --   --   --   --   --   < >  --   < > 0.19* 0.18*  --  0.27*  CREATININE  --   < >  --   < > 1.08  --   --  0.87  --   --   --  0.82  --   TROPONINI 0.03*  --  0.08*  --  <0.03  --   --   --   --   --   --   --   --   < > = values in this interval not displayed.  Estimated Creatinine Clearance: 103.5 mL/min (by C-G formula based on SCr of 0.82 mg/dL).   Assessment: Heparin for atrial fibrillation. Heparin level of 0.27 slightly subtherapeutic on heparin infusion at 2150 units/hr.    Goal of Therapy:  Heparin level 0.3-0.7 units/ml Monitor platelets by anticoagulation protocol: Yes   Plan:  -Increase heparin infusion to 2300 units/hr -repeat heparin level in 8 hours -f/u long term a/c plans   Vincenza Hews, PharmD, BCPS 02/24/2016, 1:04 PM

## 2016-02-25 ENCOUNTER — Ambulatory Visit (HOSPITAL_COMMUNITY): Admission: RE | Admit: 2016-02-25 | Payer: Medicare Other | Source: Ambulatory Visit | Admitting: Orthopedic Surgery

## 2016-02-25 ENCOUNTER — Encounter (HOSPITAL_COMMUNITY): Admission: RE | Payer: Self-pay | Source: Ambulatory Visit

## 2016-02-25 ENCOUNTER — Inpatient Hospital Stay (HOSPITAL_COMMUNITY): Payer: Medicare Other

## 2016-02-25 DIAGNOSIS — I38 Endocarditis, valve unspecified: Secondary | ICD-10-CM

## 2016-02-25 DIAGNOSIS — R7881 Bacteremia: Secondary | ICD-10-CM

## 2016-02-25 DIAGNOSIS — M4630 Infection of intervertebral disc (pyogenic), site unspecified: Secondary | ICD-10-CM

## 2016-02-25 DIAGNOSIS — K0889 Other specified disorders of teeth and supporting structures: Secondary | ICD-10-CM

## 2016-02-25 DIAGNOSIS — A419 Sepsis, unspecified organism: Secondary | ICD-10-CM

## 2016-02-25 DIAGNOSIS — I10 Essential (primary) hypertension: Secondary | ICD-10-CM

## 2016-02-25 LAB — COMPREHENSIVE METABOLIC PANEL
ALBUMIN: 2.1 g/dL — AB (ref 3.5–5.0)
ALK PHOS: 53 U/L (ref 38–126)
ALT: 70 U/L — ABNORMAL HIGH (ref 17–63)
ANION GAP: 11 (ref 5–15)
AST: 59 U/L — ABNORMAL HIGH (ref 15–41)
BUN: 18 mg/dL (ref 6–20)
CALCIUM: 7.8 mg/dL — AB (ref 8.9–10.3)
CO2: 19 mmol/L — AB (ref 22–32)
Chloride: 104 mmol/L (ref 101–111)
Creatinine, Ser: 0.85 mg/dL (ref 0.61–1.24)
GFR calc non Af Amer: >60 mL/min (ref >60)
GLUCOSE: 134 mg/dL — AB (ref 65–99)
POTASSIUM: 4.2 mmol/L (ref 3.5–5.1)
SODIUM: 134 mmol/L — AB (ref 135–145)
TOTAL PROTEIN: 5.3 g/dL — AB (ref 6.5–8.1)
Total Bilirubin: 0.7 mg/dL (ref 0.3–1.2)

## 2016-02-25 LAB — CBC
HCT: 35.2 % — ABNORMAL LOW (ref 39.0–52.0)
Hemoglobin: 12.1 g/dL — ABNORMAL LOW (ref 13.0–17.0)
MCH: 32 pg (ref 26.0–34.0)
MCHC: 34.4 g/dL (ref 30.0–36.0)
MCV: 93.1 fL (ref 78.0–100.0)
PLATELETS: 207 10*3/uL (ref 150–400)
RBC: 3.78 MIL/uL — ABNORMAL LOW (ref 4.22–5.81)
RDW: 14.7 % (ref 11.5–15.5)
WBC: 12.6 10*3/uL — AB (ref 4.0–10.5)

## 2016-02-25 LAB — CULTURE, BLOOD (ROUTINE X 2)

## 2016-02-25 LAB — HEPARIN LEVEL (UNFRACTIONATED): Heparin Unfractionated: 0.32 IU/mL (ref 0.30–0.70)

## 2016-02-25 SURGERY — LUMBAR LAMINECTOMY/DECOMPRESSION MICRODISCECTOMY
Anesthesia: General

## 2016-02-25 MED ORDER — FAMOTIDINE 20 MG PO TABS
20.0000 mg | ORAL_TABLET | Freq: Two times a day (BID) | ORAL | Status: DC
  Administered 2016-02-25 – 2016-02-28 (×7): 20 mg via ORAL
  Filled 2016-02-25 (×7): qty 1

## 2016-02-25 MED ORDER — IOPAMIDOL (ISOVUE-300) INJECTION 61%
INTRAVENOUS | Status: AC
  Filled 2016-02-25: qty 75

## 2016-02-25 NOTE — Progress Notes (Signed)
PROGRESS NOTE    Jonathan Holden  [id number] DOB: April 28, 1945 DOA: 02/21/2016 PCP: Sinclair Grooms, MD   Brief Narrative:  71 y.o.WM PMHx CAD s/p angioplasty, HTN, HLD, and arthritis   Who presented to the ED with altered mental status. 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 He visited a physician and was given a prescription for steroids and hydrocodone, which he said made him feel "weird". His back pain did not improve and he sought medical attention again. On his return visit the patient was given Flexeril and Percocet in addition to his steroids.  A stat MRI was done in the office of his Orthopedic Surgeon on 02/20/2016 and noted a large HNP at L3-L4 and a prior laminotomy at [place].  The pt was instructed to present to the ED.      In the ED the patient was found to be in SVT and was given adenosine which revealed A. fib with RVR. He was started on a diltiazem drip after which he spontaneously converted to normal sinus rhythm.   Subjective: 2/6 A/O 4. Continues to have diarrhea. Negative CP, negative SOB. Negative bilateral lower extremity pain. States back pain and when he attempts to come to seated position. Agrees that the plan is he does not want to see any other neurosurgical except for Dr. Cyndy Freeze. Complains of GERD   Assessment & Plan:   Principal Problem:   Sepsis ([place]) Active Problems:   Hyperlipidemia   CAD (coronary artery disease), native coronary artery   Essential hypertension   Hyponatremia   Atrial fibrillation with RVR (HCC)   Back pain   Acute encephalopathy   Streptococcal bacteremia   Lumbar discitis   Sepsis unspecified organism / positive strep intermedius bacteremia (2 of 2 blood cx)  -follow blood cx speciation and sensitivities pending - urine culture not revealing  - CXR w/o evidence of infiltrate  - flu/resp virus negative  - there is concern about a possible  epidural abscess, and we are awaiting the Radiolgist's interpretation of the MRI he had accomplished at [facility]. Per Dr. Latanya Maudlin orthopedic surgery note from 2/6 feel that patient has a disc space infection not a paraspinal abscess. -no exam findings to suggest cord compression today -ID following -Continue antibiotics per ID  Infection intravertebral disc vs Abcess?/ Ruptured disk in L spine -See  Dr. Latanya Maudlin orthopedic surgery note from 2/6 -2/6 again explained to patient that he had an abscess vs infected disc in his L-spine, he again stated he did not want to see any other physician then doctor [place]. Counseled patient that doctor Cyndy Freeze was on vacation patient stated he understood -See sepsis  Newly diagnosed Afib w/ RVR(CHA2DS2-VASc is at least 2)  -TSH is normal - HR well controlled at this time  - Heparin gtt  -Cardiology will continue patient on heparin drip until surgical decision finalized. -Spoke with Dr. Acie Fredrickson Cardiology and he will adjust patient's medication to PO over the next couple of days.    Diarrhea Multiple loose stools noted over night 2/4 - Cdiff testing negative - likely abx associated - trial of imodium - flexiseal until more mobile   Hypokalemia -Due to GI losses - cont to replace to goal of 4.0 - Mg is normal   CAD Remote inferior MI - asymptomatic at this time   HTN BP stable at present   CHF? -Echocardiogram pending   HLD  Cont lipitor  GERD -   DVT prophylaxis:IV heparin   Code Status:  FULL CODE Family Communication:  Disposition Plan:    Consultants:  [facility]   Procedures/Significant Events:     VENTILATOR SETTINGS:    Cultures 2/2 blood positive strep intermedius 2/2 urine insignificant growth 2/2 MRSA by PCR negative 2/3 stool culture pending 2/3 C. difficile negative 2/5 blood 2 pending    Antimicrobials: Anti-infectives    Start     Stop   02/22/16 2130  vancomycin  (VANCOCIN) 1,250 mg in sodium chloride 0.9 % 250 mL IVPB  Status:  Discontinued     02/22/16 1626   02/22/16 1800  cefTRIAXone (ROCEPHIN) 2 g in dextrose 5 % 50 mL IVPB         02/21/16 2200  oseltamivir (TAMIFLU) capsule 75 mg  Status:  Discontinued     02/22/16 1407   02/21/16 1800  vancomycin (VANCOCIN) IVPB 1000 mg/200 mL premix  Status:  Discontinued     02/22/16 1229   02/21/16 1800  piperacillin-tazobactam (ZOSYN) IVPB 3.375 g  Status:  Discontinued     02/22/16 1626   02/21/16 1015  piperacillin-tazobactam (ZOSYN) IVPB 3.375 g     02/21/16 1100   02/21/16 1015  vancomycin (VANCOCIN) IVPB 1000 mg/200 mL premix     02/21/16 1132   02/21/16 0730  oseltamivir (TAMIFLU) capsule 75 mg     02/21/16 0802       Devices    LINES / TUBES:      Continuous Infusions: . sodium chloride 10 mL/hr at 02/24/16 1344  . diltiazem (CARDIZEM) infusion 5 mg/hr (02/24/16 2341)  . heparin 2,300 Units/hr (02/24/16 2342)     Objective: Vitals:   02/24/16 2301 02/25/16 0303 02/25/16 0323 02/25/16 0700  BP: 114/75 139/86  107/81  Pulse: 90 (!) 106  (!) 103  Resp: (!) 23 (!) 23  18  Temp: 98.5 F (36.9 C) 99.7 F (37.6 C)  99.1 F (37.3 C)  TempSrc: Oral Oral  Oral  SpO2: 96% 95%  96%  Weight:   99.7 kg (219 lb 12.8 oz)   Height:        Intake/Output Summary (Last 24 hours) at 02/25/16 [id number] Last data filed at 02/25/16 0700  Gross per 24 hour  Intake          1298.02 ml  Output             1175 ml  Net           123.02 ml   Filed Weights   02/22/16 0300 02/23/16 0250 02/25/16 0323  Weight: 100.9 kg (222 lb 7.1 oz) 101.6 kg (223 lb 15.8 oz) 99.7 kg (219 lb 12.8 oz)    Examination:  General: A/O 4, NAD, No acute respiratory distress Eyes: negative scleral hemorrhage, negative anisocoria, negative icterus ENT: Negative Runny nose, negative gingival bleeding, Neck:  Negative scars, masses, torticollis, lymphadenopathy, JVD Lungs: Clear to auscultation bilaterally without  wheezes or crackles Cardiovascular: Regular rate and rhythm without murmur gallop or rub normal S1 and S2 Abdomen: negative abdominal pain, nondistended, positive soft, bowel sounds, no rebound, no ascites, no appreciable mass Extremities: No significant cyanosis, clubbing, or edema bilateral lower extremities Skin: Negative rashes, lesions, ulcers Psychiatric:  Negative depression, negative anxiety, negative fatigue, negative mania  Central nervous system:  Cranial nerves II through XII intact, tongue/uvula midline, all extremities muscle strength 5/5, sensation intact throughout, negative dysarthria, negative expressive aphasia, negative receptive aphasia.  Patient unable to come to seated position secondary to extreme pain in his lower back.  .     Data Reviewed: Care during the described time interval was provided by me .  I have reviewed this patient's available data, including medical history, events of note, physical examination, and all test results as part of my evaluation. I have personally reviewed and interpreted all radiology studies.  CBC:  Recent Labs Lab 02/21/16 0612  02/21/16 2118 02/22/16 1054 02/23/16 0326 02/24/16 0139 02/25/16 0401  WBC 11.0*  --  9.7 10.5 14.3* 13.3* 12.6*  NEUTROABS 8.6*  --  7.5 8.4*  --   --   --   HGB 13.7  < > 12.2* 11.5* 12.0* 12.3* 12.1*  HCT 38.9*  < > 35.2* 33.2* 34.6* 36.1* 35.2*  MCV 91.5  --  92.6 92.2 92.0 92.1 93.1  PLT 136*  --  125* 130* 141* 183 207  < > = values in this interval not displayed. Basic Metabolic Panel:  Recent Labs Lab 02/21/16 1836  02/22/16 0120 02/22/16 0723 02/23/16 0326 02/24/16 0139 02/25/16 0401  NA  --   < > 136 137 134* 133* 134*  K  --   < > 3.8 4.8 3.0* 3.3* 4.2  CL  --   < > 106 107 103 105 104  CO2  --   < > 21* 21* 20* 20* 19*  GLUCOSE  --   < > 106* 97 124* 129* 134*  BUN  --   < > 18 19 17 20 18   CREATININE  --   < > 0.83 1.08 0.87 0.82 0.85  CALCIUM  --   < > 8.2* 7.9* 8.0* 7.6*  7.8*  MG 1.8  --   --   --   --  2.4  --   PHOS 3.0  --   --   --   --   --   --   < > = values in this interval not displayed. GFR: Estimated Creatinine Clearance: 99 mL/min (by C-G formula based on SCr of 0.85 mg/dL). Liver Function Tests:  Recent Labs Lab 02/21/16 0612 02/23/16 0326 02/24/16 0139 02/25/16 0401  AST 30 35 60* 59*  ALT 26 28 49 70*  ALKPHOS 58 50 57 53  BILITOT 1.0 1.0 0.6 0.7  PROT 6.4* 5.5* 5.4* 5.3*  ALBUMIN 3.0* 2.2* 2.0* 2.1*    Recent Labs Lab 02/21/16 1836  LIPASE 16   No results for input(s): AMMONIA in the last 168 hours. Coagulation Profile:  Recent Labs Lab 02/21/16 0647  INR 1.26   Cardiac Enzymes:  Recent Labs Lab 02/21/16 1836 02/21/16 2118 02/21/16 2332 02/22/16 0723  TROPONINI 0.06* 0.03* 0.08* <0.03   BNP (last 3 results) No results for input(s): PROBNP in the last 8760 hours. HbA1C: No results for input(s): HGBA1C in the last 72 hours. CBG: No results for input(s): GLUCAP in the last 168 hours. Lipid Profile: No results for input(s): CHOL, HDL, LDLCALC, TRIG, CHOLHDL, LDLDIRECT in the last 72 hours. Thyroid Function Tests: No results for input(s): TSH, T4TOTAL, FREET4, T3FREE, THYROIDAB in the last 72 hours. Anemia Panel:  Recent Labs  02/23/16 0325 02/23/16 0326  VITAMINB12  --  591  FOLATE 17.4  --    Urine analysis:    Component Value Date/Time   COLORURINE YELLOW 02/21/2016 0626   APPEARANCEUR HAZY (A) 02/21/2016 0626   LABSPEC 1.020 02/21/2016 0626   PHURINE 5.0 02/21/2016 0626   GLUCOSEU NEGATIVE 02/21/2016  0626   HGBUR MODERATE (A) 02/21/2016 0626   BILIRUBINUR NEGATIVE 02/21/2016 0626   KETONESUR NEGATIVE 02/21/2016 0626   PROTEINUR 30 (A) 02/21/2016 0626   NITRITE NEGATIVE 02/21/2016 0626   LEUKOCYTESUR NEGATIVE 02/21/2016 0626   Sepsis Labs: @LABRCNTIP (procalcitonin:4,lacticidven:4)  ) Recent Results (from the past 240 hour(s))  Blood Culture (routine x 2)     Status: Abnormal  (Preliminary result)   Collection Time: 02/21/16  6:09 AM  Result Value Ref Range Status   Specimen Description BLOOD RIGHT ARM  Final   Special Requests BOTTLES DRAWN AEROBIC AND ANAEROBIC 5ML  Final   Culture  Setup Time   Final    GRAM POSITIVE COCCI IN CHAINS IN BOTH AEROBIC AND ANAEROBIC BOTTLES CRITICAL RESULT CALLED TO, READ BACK BY AND VERIFIED WITH: E MARTIN,PHARMD AT 1600 02/22/16 BY J FUDESCO    Culture (A)  Final    STREPTOCOCCUS INTERMEDIUS SUSCEPTIBILITIES TO FOLLOW    Report Status PENDING  Incomplete  Blood Culture ID Panel (Reflexed)     Status: Abnormal   Collection Time: 02/21/16  6:09 AM  Result Value Ref Range Status   Enterococcus species NOT DETECTED NOT DETECTED Final   Listeria monocytogenes NOT DETECTED NOT DETECTED Final   Staphylococcus species NOT DETECTED NOT DETECTED Final   Staphylococcus aureus NOT DETECTED NOT DETECTED Final   Streptococcus species DETECTED (A) NOT DETECTED Final    Comment: Not Enterococcus species, Streptococcus agalactiae, Streptococcus pyogenes, or Streptococcus pneumoniae. CRITICAL RESULT CALLED TO, READ BACK BY AND VERIFIED WITH: E. MARTIN, 02/22/16 AT 1600 BY J FUDESCO    Streptococcus agalactiae NOT DETECTED NOT DETECTED Final   Streptococcus pneumoniae NOT DETECTED NOT DETECTED Final   Streptococcus pyogenes NOT DETECTED NOT DETECTED Final   Acinetobacter baumannii NOT DETECTED NOT DETECTED Final   Enterobacteriaceae species NOT DETECTED NOT DETECTED Final   Enterobacter cloacae complex NOT DETECTED NOT DETECTED Final   Escherichia coli NOT DETECTED NOT DETECTED Final   Klebsiella oxytoca NOT DETECTED NOT DETECTED Final   Klebsiella pneumoniae NOT DETECTED NOT DETECTED Final   Proteus species NOT DETECTED NOT DETECTED Final   Serratia marcescens NOT DETECTED NOT DETECTED Final   Haemophilus influenzae NOT DETECTED NOT DETECTED Final   Neisseria meningitidis NOT DETECTED NOT DETECTED Final   Pseudomonas aeruginosa NOT  DETECTED NOT DETECTED Final   Candida albicans NOT DETECTED NOT DETECTED Final   Candida glabrata NOT DETECTED NOT DETECTED Final   Candida krusei NOT DETECTED NOT DETECTED Final   Candida parapsilosis NOT DETECTED NOT DETECTED Final   Candida tropicalis NOT DETECTED NOT DETECTED Final  Blood Culture (routine x 2)     Status: Abnormal (Preliminary result)   Collection Time: 02/21/16  6:17 AM  Result Value Ref Range Status   Specimen Description BLOOD LEFT ARM  Final   Special Requests BOTTLES DRAWN AEROBIC AND ANAEROBIC 5ML  Final   Culture  Setup Time   Final    GRAM POSITIVE COCCI IN CHAINS ANAEROBIC BOTTLE ONLY CRITICAL VALUE NOTED.  VALUE IS CONSISTENT WITH PREVIOUSLY REPORTED AND CALLED VALUE.    Culture (A)  Final    STREPTOCOCCUS INTERMEDIUS SUSCEPTIBILITIES TO FOLLOW    Report Status PENDING  Incomplete  Urine culture     Status: Abnormal   Collection Time: 02/21/16  6:26 AM  Result Value Ref Range Status   Specimen Description URINE, RANDOM  Final   Special Requests NONE  Final   Culture <10,000 COLONIES/mL INSIGNIFICANT GROWTH (A)  Final   Report  Status 02/22/2016 FINAL  Final  Respiratory Panel by PCR     Status: None   Collection Time: 02/21/16  1:20 PM  Result Value Ref Range Status   Adenovirus NOT DETECTED NOT DETECTED Final   Coronavirus 229E NOT DETECTED NOT DETECTED Final   Coronavirus HKU1 NOT DETECTED NOT DETECTED Final   Coronavirus NL63 NOT DETECTED NOT DETECTED Final   Coronavirus OC43 NOT DETECTED NOT DETECTED Final   Metapneumovirus NOT DETECTED NOT DETECTED Final   Rhinovirus / Enterovirus NOT DETECTED NOT DETECTED Final   Influenza A NOT DETECTED NOT DETECTED Final   Influenza B NOT DETECTED NOT DETECTED Final   Parainfluenza Virus 1 NOT DETECTED NOT DETECTED Final   Parainfluenza Virus 2 NOT DETECTED NOT DETECTED Final   Parainfluenza Virus 3 NOT DETECTED NOT DETECTED Final   Parainfluenza Virus 4 NOT DETECTED NOT DETECTED Final   Respiratory  Syncytial Virus NOT DETECTED NOT DETECTED Final   Bordetella pertussis NOT DETECTED NOT DETECTED Final   Chlamydophila pneumoniae NOT DETECTED NOT DETECTED Final   Mycoplasma pneumoniae NOT DETECTED NOT DETECTED Final  MRSA PCR Screening     Status: None   Collection Time: 02/21/16  8:43 PM  Result Value Ref Range Status   MRSA by PCR NEGATIVE NEGATIVE Final    Comment:        The GeneXpert MRSA Assay (FDA approved for NASAL specimens only), is one component of a comprehensive MRSA colonization surveillance program. It is not intended to diagnose MRSA infection nor to guide or monitor treatment for MRSA infections.   C difficile quick scan w PCR reflex     Status: None   Collection Time: 02/22/16  9:31 PM  Result Value Ref Range Status   C Diff antigen NEGATIVE NEGATIVE Final   C Diff toxin NEGATIVE NEGATIVE Final   C Diff interpretation No C. difficile detected.  Final         Radiology Studies: Dg Abd Portable 1v  Result Date: 02/23/2016 CLINICAL DATA:  Rule out ileus. EXAM: PORTABLE ABDOMEN - 1 VIEW COMPARISON:  February 21, 2016 FINDINGS: Gastric distention. The large and small bowel are normal in caliber. No other abnormalities. IMPRESSION: Gastric distention. The large and small bowel are normal in caliber. Electronically Signed   By: Dorise Bullion III M.D   On: 02/23/2016 08:56        Scheduled Meds: . Alirocumab  150 mg Subcutaneous Q14 Days  . atorvastatin  40 mg Oral Daily  . cefTRIAXone (ROCEPHIN)  IV  2 g Intravenous Q24H  . folic acid  1 mg Oral Daily  . lidocaine  2 patch Transdermal Q24H  . lisinopril  20 mg Oral Daily  . mouth rinse  15 mL Mouth Rinse BID  . metoprolol tartrate  50 mg Oral BID  . multivitamin with minerals  1 tablet Oral Daily  . pantoprazole  40 mg Oral Q1200  . pneumococcal 23 valent vaccine  0.5 mL Intramuscular Tomorrow-1000  . thiamine  100 mg Oral Daily   Continuous Infusions: . sodium chloride 10 mL/hr at 02/24/16 1344    . diltiazem (CARDIZEM) infusion 5 mg/hr (02/24/16 2341)  . heparin 2,300 Units/hr (02/24/16 2342)     LOS: 4 days    Time spent: 40 minutes    WOODS, Geraldo Docker, MD [facility] Pager [phone]   If 7PM-7AM, please contact night-coverage [email or web address] Password [facility] 02/25/2016, 8:37 AM

## 2016-02-25 NOTE — Progress Notes (Signed)
Subjective:    Patient reports pain as 4 on 0-10 scale. Doing much better mentally. He continues to have back pain. No Leg pain and is neurologically  Intact.I soke with Dr. Thereasa Solo.Dr. Maree Erie and called the radiologist at [facility] and we all feel that this is a Disc Space infection. Thus far Dr. Maree Erie does not feel tha he has a Paraspinal abscess. I also called Dr. Hewitt Shorts office and gave the history to his nurse,since he was on vacation. The patient and his friend request that he and not me be assigned hi doctor. I told them that I would follow him out of courtesy to the patient until Dr. Cyndy Freeze agrees to take over his care. As I mentioned earlier I called Dr. Johnnye Sima.Infectious Disease,to help out with his infection.He has a slight temp.of 99 this morning and his WBC is decreasing.Dr. Maree Erie has a copy of the MRI from our office and we discussed it yesterday.   Objective: Vital signs in last 24 hours: Temp:  [98.1 F (36.7 C)-99.7 F (37.6 C)] 99.7 F (37.6 C) (02/06 0303) Pulse Rate:  [78-106] 106 (02/06 0303) Resp:  [17-26] 23 (02/06 0303) BP: (99-139)/(66-87) 139/86 (02/06 0303) SpO2:  [95 %-97 %] 95 % (02/06 0303) Weight:  [99.7 kg (219 lb 12.8 oz)] 99.7 kg (219 lb 12.8 oz) (02/06 0323)  Intake/Output from previous day: 02/05 0701 - 02/06 0700 In: 1480 [P.O.:120; I.V.:1310; IV Piggyback:50] Out: 1075 [Urine:775; Stool:300] Intake/Output this shift: No intake/output data recorded.   Recent Labs  02/22/16 1054 02/23/16 0326 02/24/16 0139 02/25/16 0401  HGB 11.5* 12.0* 12.3* 12.1*    Recent Labs  02/24/16 0139 02/25/16 0401  WBC 13.3* 12.6*  RBC 3.92* 3.78*  HCT 36.1* 35.2*  PLT 183 207    Recent Labs  02/24/16 0139 02/25/16 0401  NA 133* 134*  K 3.3* 4.2  CL 105 104  CO2 20* 19*  BUN 20 18  CREATININE 0.82 0.85  GLUCOSE 129* 134*  CALCIUM 7.6* 7.8*   No results for input(s): LABPT, INR in the last 72 hours.  Neurologically intact Dorsiflexion/Plantar  flexion intact No cellulitis present  Assessment/Plan:    Up with therapy.Waiting for Dr. Cyndy Freeze to evaluate and accept the patient.  Timi Reeser A 02/25/2016, 7:35 AM

## 2016-02-25 NOTE — Progress Notes (Signed)
Progress Note  Patient Name: Jonathan Holden Date of Encounter: 02/25/2016  Primary Cardiologist: Tamala Julian  Subjective     71 YOM with hx of CAD including remote inf MI with PTCA, HTN and HLD.  + HTN.  Pt admitted with AMS, recent back pain, and HNP at L3-L4 and a prior laminotomy at L4-L5-S1 per outpt MD.  Pt with sepsis and + SVT/ a fib.    Inpatient Medications    Scheduled Meds: . Alirocumab  150 mg Subcutaneous Q14 Days  . atorvastatin  40 mg Oral Daily  . cefTRIAXone (ROCEPHIN)  IV  2 g Intravenous Q24H  . famotidine  20 mg Oral BID  . folic acid  1 mg Oral Daily  . lidocaine  2 patch Transdermal Q24H  . lisinopril  20 mg Oral Daily  . mouth rinse  15 mL Mouth Rinse BID  . metoprolol tartrate  50 mg Oral BID  . multivitamin with minerals  1 tablet Oral Daily  . pantoprazole  40 mg Oral Q1200  . pneumococcal 23 valent vaccine  0.5 mL Intramuscular Tomorrow-1000  . thiamine  100 mg Oral Daily   Continuous Infusions: . sodium chloride 10 mL/hr at 02/24/16 1344  . diltiazem (CARDIZEM) infusion 5 mg/hr (02/24/16 2341)  . heparin 2,400 Units/hr (02/25/16 0828)   PRN Meds: acetaminophen **OR** acetaminophen, alum & mag hydroxide-simeth, gi cocktail, haloperidol lactate, hydrALAZINE, ketorolac, loperamide, Melatonin, metoprolol, ondansetron **OR** ondansetron (ZOFRAN) IV, polyethylene glycol   Vital Signs    Vitals:   02/25/16 0700 02/25/16 0800 02/25/16 0900 02/25/16 1000  BP: 107/81 116/82 118/79 (!) 152/93  Pulse: (!) 103 93 (!) 102 (!) 101  Resp: 18 19 18  (!) 21  Temp: 99.1 F (37.3 C)     TempSrc: Oral     SpO2: 96% 98% 97% 97%  Weight:      Height:        Intake/Output Summary (Last 24 hours) at 02/25/16 1133 Last data filed at 02/25/16 0700  Gross per 24 hour  Intake           956.49 ml  Output              775 ml  Net           181.49 ml   Filed Weights   02/22/16 0300 02/23/16 0250 02/25/16 0323  Weight: 222 lb 7.1 oz (100.9 kg) 223 lb 15.8 oz  (101.6 kg) 219 lb 12.8 oz (99.7 kg)    Telemetry    Atrial  - Personally Reviewed  ECG       Physical Exam   GEN: No acute distress.   Neck: No JVD Cardiac: RRR, no murmurs, rubs, or gallops.  Respiratory: Clear to auscultation bilaterally. GI: Soft, nontender, non-distended  MS: No edema; No deformity. Neuro:  Nonfocal  Psych: Normal affect   Labs    Chemistry Recent Labs Lab 02/23/16 0326 02/24/16 0139 02/25/16 0401  NA 134* 133* 134*  K 3.0* 3.3* 4.2  CL 103 105 104  CO2 20* 20* 19*  GLUCOSE 124* 129* 134*  BUN 17 20 18   CREATININE 0.87 0.82 0.85  CALCIUM 8.0* 7.6* 7.8*  PROT 5.5* 5.4* 5.3*  ALBUMIN 2.2* 2.0* 2.1*  AST 35 60* 59*  ALT 28 49 70*  ALKPHOS 50 57 53  BILITOT 1.0 0.6 0.7  GFRNONAA >60 >60 >60  GFRAA >60 >60 >60  ANIONGAP 11 8 11      Hematology Recent Labs Lab 02/23/16 [id number]  02/24/16 0139 02/25/16 0401  WBC 14.3* 13.3* 12.6*  RBC 3.76* 3.92* 3.78*  HGB 12.0* 12.3* 12.1*  HCT 34.6* 36.1* 35.2*  MCV 92.0 92.1 93.1  MCH 31.9 31.4 32.0  MCHC 34.7 34.1 34.4  RDW 14.6 15.0 14.7  PLT 141* 183 207    Cardiac Enzymes Recent Labs Lab 02/21/16 1836 02/21/16 2118 02/21/16 2332 02/22/16 0723  TROPONINI 0.06* 0.03* 0.08* <0.03   No results for input(s): TROPIPOC in the last 168 hours.   BNPNo results for input(s): BNP, PROBNP in the last 168 hours.   DDimer No results for input(s): DDIMER in the last 168 hours.   Radiology    No results found.  Cardiac Studies      Patient Profile     71 y.o. male - new Onset atrial fibrillation in the setting of sepsis syndrome. This a question of discitis.   Assessment & Plan    1. Atrial fibrillation: His rate is well-controlled. He's currently on IV heparin. We can change him to an oral anticoagulant at any point. I would like to wait to see whether or not he needs a vertebral disc biopsy. He has started receiving by mouth metoprolol. He is on a low-dose diltiazem drip. We will  discontinue the diltiazem drip today.  2. vetebral disc discitis?  He may need to have a vertebral disc biopsy but the patient wants to wait until Dr. Cyndy Freeze returns from vacation. Dr. Sherral Hammers will continue to manage this .     Signed, Mertie Moores, MD  02/25/2016, 11:33 AM

## 2016-02-25 NOTE — Progress Notes (Signed)
ANTICOAGULATION CONSULT NOTE - Follow Up Consult  Pharmacy Consult for Heparin  Indication: atrial fibrillation  Allergies  Allergen Reactions  . Other Other (See Comments)    Pt reports allergic reaction to melons - unknown reaction  . Ciprofloxacin Hives   Patient Measurements: Height: 6\' 1"  (185.4 cm) Weight: 219 lb 12.8 oz (99.7 kg) IBW/kg (Calculated) : 79.9  Vital Signs: Temp: 99.1 F (37.3 C) (02/06 0700) Temp Source: Oral (02/06 0700) BP: 107/81 (02/06 0700) Pulse Rate: 103 (02/06 0700)  Labs:  Recent Labs  02/23/16 0326  02/24/16 0139 02/24/16 1151 02/24/16 2209 02/25/16 0401  HGB 12.0*  --  12.3*  --   --  12.1*  HCT 34.6*  --  36.1*  --   --  35.2*  PLT 141*  --  183  --   --  207  HEPARINUNFRC  --   < >  --  0.27* 0.45 0.32  CREATININE 0.87  --  0.82  --   --  0.85  < > = values in this interval not displayed.  Estimated Creatinine Clearance: 99 mL/min (by C-G formula based on SCr of 0.85 mg/dL).   Assessment: Heparin for atrial fibrillation. Heparin level remains therapeutic this am at 0.32 but has trended down from previously. CBC stable. Awaiting surgical decision so can determine long term oral a/c.   Goal of Therapy:  Heparin level 0.3-0.7 units/ml Monitor platelets by anticoagulation protocol: Yes   Plan:  -Slightly increase heparin to infusion to 2400 units/hr -await decision on surgical plans   Vincenza Hews, PharmD, BCPS 02/25/2016, 8:25 AM

## 2016-02-25 NOTE — Progress Notes (Addendum)
Partner called at 52. She asked if he received a bath and got his teeth brushed. Then asked when the doctors would be in. Rn recommended that she come by at 7:30AM due to doctors rounding early. She then stated she was very upset about her boyfriends care at [facility] and began screaming over the phone. RN asked if there was anything the RN could do for her or if she had any questions that could be answered.

## 2016-02-25 NOTE — Care Management Important Message (Signed)
Important Message  Patient Details  Name: Jonathan Holden MRN: [id number] Date of Birth: [birth year]   Medicare Important Message Given:  Yes    Nathen May 02/25/2016, 12:51 PM

## 2016-02-25 NOTE — Progress Notes (Signed)
Subjective:  No new complaints.    Antibiotics:  Anti-infectives    Start     Dose/Rate Route Frequency Ordered Stop   02/22/16 2130  vancomycin (VANCOCIN) 1,250 mg in sodium chloride 0.9 % 250 mL IVPB  Status:  Discontinued     1,250 mg 166.7 mL/hr over 90 Minutes Intravenous Every 12 hours 02/22/16 1229 02/22/16 1626   02/22/16 1800  cefTRIAXone (ROCEPHIN) 2 g in dextrose 5 % 50 mL IVPB     2 g 100 mL/hr over 30 Minutes Intravenous Every 24 hours 02/22/16 1626     02/21/16 2200  oseltamivir (TAMIFLU) capsule 75 mg  Status:  Discontinued     75 mg Oral 2 times daily 02/21/16 1516 02/22/16 1407   02/21/16 1800  vancomycin (VANCOCIN) IVPB 1000 mg/200 mL premix  Status:  Discontinued     1,000 mg 200 mL/hr over 60 Minutes Intravenous Every 8 hours 02/21/16 1138 02/22/16 1229   02/21/16 1800  piperacillin-tazobactam (ZOSYN) IVPB 3.375 g  Status:  Discontinued     3.375 g 12.5 mL/hr over 240 Minutes Intravenous Every 8 hours 02/21/16 1138 02/22/16 1626   02/21/16 1015  piperacillin-tazobactam (ZOSYN) IVPB 3.375 g     3.375 g 100 mL/hr over 30 Minutes Intravenous  Once 02/21/16 1002 02/21/16 1100   02/21/16 1015  vancomycin (VANCOCIN) IVPB 1000 mg/200 mL premix     1,000 mg 200 mL/hr over 60 Minutes Intravenous  Once 02/21/16 1002 02/21/16 1132   02/21/16 0730  oseltamivir (TAMIFLU) capsule 75 mg     75 mg Oral  Once 02/21/16 [id number] 02/21/16 0802      Medications: Scheduled Meds: . Alirocumab  150 mg Subcutaneous Q14 Days  . atorvastatin  40 mg Oral Daily  . cefTRIAXone (ROCEPHIN)  IV  2 g Intravenous Q24H  . famotidine  20 mg Oral BID  . folic acid  1 mg Oral Daily  . lidocaine  2 patch Transdermal Q24H  . lisinopril  20 mg Oral Daily  . mouth rinse  15 mL Mouth Rinse BID  . metoprolol tartrate  50 mg Oral BID  . multivitamin with minerals  1 tablet Oral Daily  . pantoprazole  40 mg Oral Q1200  . pneumococcal 23 valent vaccine  0.5 mL Intramuscular Tomorrow-1000    . thiamine  100 mg Oral Daily   Continuous Infusions: . sodium chloride 10 mL/hr at 02/24/16 1344  . heparin 2,400 Units/hr (02/25/16 1338)   PRN Meds:.acetaminophen **OR** acetaminophen, alum & mag hydroxide-simeth, gi cocktail, haloperidol lactate, hydrALAZINE, ketorolac, loperamide, Melatonin, metoprolol, ondansetron **OR** ondansetron (ZOFRAN) IV, polyethylene glycol    Objective: Weight change:   Intake/Output Summary (Last 24 hours) at 02/25/16 1644 Last data filed at 02/25/16 1500  Gross per 24 hour  Intake           775.99 ml  Output             1175 ml  Net          -399.01 ml   Blood pressure 137/88, pulse (!) 109, temperature 98 F (36.7 C), temperature source Oral, resp. rate (!) 29, height 6\' 1"  (1.854 m), weight 219 lb 12.8 oz (99.7 kg), SpO2 98 %. Temp:  [98 F (36.7 C)-99.7 F (37.6 C)] 98 F (36.7 C) (02/06 1532) Pulse Rate:  [84-109] 109 (02/06 1532) Resp:  [15-29] 29 (02/06 1532) BP: (107-152)/(68-93) 137/88 (02/06 1532) SpO2:  [95 %-98 %] 98 % (02/06 1532)  Weight:  [219 lb 12.8 oz (99.7 kg)] 219 lb 12.8 oz (99.7 kg) (02/06 0323)  Physical Exam: General: Alert and awake, oriented x3, not in any acute distress. HEENT: anicteric sclera,EOMI, dentition is poor in areas CVS regular rate, normal r,  no murmur rubs or gallops Chest: clear to auscultation bilaterally, no wheezing, rales or rhonchi Abdomen: soft nontender, nondistended, normal bowel sounds, Extremities: no  clubbing or edema noted bilaterally Skin: no rashes,  Neuro: nonfocal  CBC:  CBC Latest Ref Rng & Units 02/25/2016 02/24/2016 02/23/2016  WBC 4.0 - 10.5 K/uL 12.6(H) 13.3(H) 14.3(H)  Hemoglobin 13.0 - 17.0 g/dL 12.1(L) 12.3(L) 12.0(L)  Hematocrit 39.0 - 52.0 % 35.2(L) 36.1(L) 34.6(L)  Platelets 150 - 400 K/uL 207 183 141(L)      BMET  Recent Labs  02/24/16 0139 02/25/16 0401  NA 133* 134*  K 3.3* 4.2  CL 105 104  CO2 20* 19*  GLUCOSE 129* 134*  BUN 20 18  CREATININE 0.82 0.85   CALCIUM 7.6* 7.8*     Liver Panel   Recent Labs  02/24/16 0139 02/25/16 0401  PROT 5.4* 5.3*  ALBUMIN 2.0* 2.1*  AST 60* 59*  ALT 49 70*  ALKPHOS 57 53  BILITOT 0.6 0.7       Sedimentation Rate No results for input(s): ESRSEDRATE in the last 72 hours. C-Reactive Protein No results for input(s): CRP in the last 72 hours.  Micro Results: Recent Results (from the past 720 hour(s))  Blood Culture (routine x 2)     Status: Abnormal (Preliminary result)   Collection Time: 02/21/16  6:09 AM  Result Value Ref Range Status   Specimen Description BLOOD RIGHT ARM  Final   Special Requests BOTTLES DRAWN AEROBIC AND ANAEROBIC 5ML  Final   Culture  Setup Time   Final    GRAM POSITIVE COCCI IN CHAINS IN BOTH AEROBIC AND ANAEROBIC BOTTLES CRITICAL RESULT CALLED TO, READ BACK BY AND VERIFIED WITH: E MARTIN,PHARMD AT 1600 02/22/16 BY J FUDESCO    Culture (A)  Final    STREPTOCOCCUS INTERMEDIUS SUSCEPTIBILITIES PERFORMED ON PREVIOUS CULTURE WITHIN THE LAST 5 DAYS.    Report Status PENDING  Incomplete  Blood Culture ID Panel (Reflexed)     Status: Abnormal   Collection Time: 02/21/16  6:09 AM  Result Value Ref Range Status   Enterococcus species NOT DETECTED NOT DETECTED Final   Listeria monocytogenes NOT DETECTED NOT DETECTED Final   Staphylococcus species NOT DETECTED NOT DETECTED Final   Staphylococcus aureus NOT DETECTED NOT DETECTED Final   Streptococcus species DETECTED (A) NOT DETECTED Final    Comment: Not Enterococcus species, Streptococcus agalactiae, Streptococcus pyogenes, or Streptococcus pneumoniae. CRITICAL RESULT CALLED TO, READ BACK BY AND VERIFIED WITH: E. MARTIN, 02/22/16 AT 1600 BY J FUDESCO    Streptococcus agalactiae NOT DETECTED NOT DETECTED Final   Streptococcus pneumoniae NOT DETECTED NOT DETECTED Final   Streptococcus pyogenes NOT DETECTED NOT DETECTED Final   Acinetobacter baumannii NOT DETECTED NOT DETECTED Final   Enterobacteriaceae species NOT  DETECTED NOT DETECTED Final   Enterobacter cloacae complex NOT DETECTED NOT DETECTED Final   Escherichia coli NOT DETECTED NOT DETECTED Final   Klebsiella oxytoca NOT DETECTED NOT DETECTED Final   Klebsiella pneumoniae NOT DETECTED NOT DETECTED Final   Proteus species NOT DETECTED NOT DETECTED Final   Serratia marcescens NOT DETECTED NOT DETECTED Final   Haemophilus influenzae NOT DETECTED NOT DETECTED Final   Neisseria meningitidis NOT DETECTED NOT DETECTED Final   Pseudomonas  aeruginosa NOT DETECTED NOT DETECTED Final   Candida albicans NOT DETECTED NOT DETECTED Final   Candida glabrata NOT DETECTED NOT DETECTED Final   Candida krusei NOT DETECTED NOT DETECTED Final   Candida parapsilosis NOT DETECTED NOT DETECTED Final   Candida tropicalis NOT DETECTED NOT DETECTED Final  Blood Culture (routine x 2)     Status: Abnormal   Collection Time: 02/21/16  6:17 AM  Result Value Ref Range Status   Specimen Description BLOOD LEFT ARM  Final   Special Requests BOTTLES DRAWN AEROBIC AND ANAEROBIC 5ML  Final   Culture  Setup Time   Final    GRAM POSITIVE COCCI IN CHAINS ANAEROBIC BOTTLE ONLY CRITICAL VALUE NOTED.  VALUE IS CONSISTENT WITH PREVIOUSLY REPORTED AND CALLED VALUE.    Culture STREPTOCOCCUS INTERMEDIUS (A)  Final   Report Status 02/25/2016 FINAL  Final   Organism ID, Bacteria STREPTOCOCCUS INTERMEDIUS  Final      Susceptibility   Streptococcus intermedius - MIC*    PENICILLIN <=0.06 SENSITIVE Sensitive     CEFTRIAXONE 0.25 SENSITIVE Sensitive     ERYTHROMYCIN <=0.12 SENSITIVE Sensitive     LEVOFLOXACIN 0.5 SENSITIVE Sensitive     VANCOMYCIN 0.5 SENSITIVE Sensitive     * STREPTOCOCCUS INTERMEDIUS  Urine culture     Status: Abnormal   Collection Time: 02/21/16  6:26 AM  Result Value Ref Range Status   Specimen Description URINE, RANDOM  Final   Special Requests NONE  Final   Culture <10,000 COLONIES/mL INSIGNIFICANT GROWTH (A)  Final   Report Status 02/22/2016 FINAL  Final    Respiratory Panel by PCR     Status: None   Collection Time: 02/21/16  1:20 PM  Result Value Ref Range Status   Adenovirus NOT DETECTED NOT DETECTED Final   Coronavirus 229E NOT DETECTED NOT DETECTED Final   Coronavirus HKU1 NOT DETECTED NOT DETECTED Final   Coronavirus NL63 NOT DETECTED NOT DETECTED Final   Coronavirus OC43 NOT DETECTED NOT DETECTED Final   Metapneumovirus NOT DETECTED NOT DETECTED Final   Rhinovirus / Enterovirus NOT DETECTED NOT DETECTED Final   Influenza A NOT DETECTED NOT DETECTED Final   Influenza B NOT DETECTED NOT DETECTED Final   Parainfluenza Virus 1 NOT DETECTED NOT DETECTED Final   Parainfluenza Virus 2 NOT DETECTED NOT DETECTED Final   Parainfluenza Virus 3 NOT DETECTED NOT DETECTED Final   Parainfluenza Virus 4 NOT DETECTED NOT DETECTED Final   Respiratory Syncytial Virus NOT DETECTED NOT DETECTED Final   Bordetella pertussis NOT DETECTED NOT DETECTED Final   Chlamydophila pneumoniae NOT DETECTED NOT DETECTED Final   Mycoplasma pneumoniae NOT DETECTED NOT DETECTED Final  MRSA PCR Screening     Status: None   Collection Time: 02/21/16  8:43 PM  Result Value Ref Range Status   MRSA by PCR NEGATIVE NEGATIVE Final    Comment:        The GeneXpert MRSA Assay (FDA approved for NASAL specimens only), is one component of a comprehensive MRSA colonization surveillance program. It is not intended to diagnose MRSA infection nor to guide or monitor treatment for MRSA infections.   Stool culture (children & immunocomp patients)     Status: None (Preliminary result)   Collection Time: 02/22/16  9:31 PM  Result Value Ref Range Status   Salmonella/Shigella Screen Preliminary report  Final    Comment: (NOTE) Performed At: [facility] [place], [place] [id number] Lindon Romp MD [id number]    Campylobacter Culture PENDING  Incomplete   E coli, Shiga toxin Assay Negative Negative Final    Comment: (NOTE) Performed At: [facility] [place], [place] [id number] Lindon Romp MD [id number]   C difficile quick scan w PCR reflex     Status: None   Collection Time: 02/22/16  9:31 PM  Result Value Ref Range Status   C Diff antigen NEGATIVE NEGATIVE Final   C Diff toxin NEGATIVE NEGATIVE Final   C Diff interpretation No C. difficile detected.  Final  STOOL CULTURE REFLEX - RSASHR     Status: None   Collection Time: 02/22/16  9:31 PM  Result Value Ref Range Status   Stool Culture result 1 (RSASHR) Comment  Final    Comment: (NOTE) Microbiological testing to rule out the presence of possible pathogens is in progress. Performed At: [facility] [place], [place] [id number] Lindon Romp MD [id number]   Culture, blood (Routine X 2) w Reflex to ID Panel     Status: None (Preliminary result)   Collection Time: 02/24/16  7:39 AM  Result Value Ref Range Status   Specimen Description BLOOD RIGHT HAND  Final   Special Requests BOTTLES DRAWN AEROBIC AND ANAEROBIC 5CC  Final   Culture NO GROWTH 1 DAY  Final   Report Status PENDING  Incomplete  Culture, blood (routine x 2)     Status: None (Preliminary result)   Collection Time: 02/24/16  7:46 AM  Result Value Ref Range Status   Specimen Description BLOOD LEFT HAND  Final   Special Requests BOTTLES DRAWN AEROBIC AND ANAEROBIC 5CC  Final   Culture NO GROWTH 1 DAY  Final   Report Status PENDING  Incomplete    Studies/Results: No results found.    Assessment/Plan:  INTERVAL HISTORY:   MRI report that was scanned in does not mention disk space infection though Dr. Maree Erie has read and feels that there is   Principal Problem:   Sepsis [facility]) Active Problems:   Hyperlipidemia   CAD (coronary artery disease), native coronary artery   Essential hypertension   Hyponatremia   Atrial fibrillation with RVR (HCC)   Back pain   Acute encephalopathy   Streptococcal bacteremia   Lumbar  discitis    Jonathan Holden is a 71 y.o. male with  Streptococcal bacteremia and diskitis L4-L5, L5-S1   It is NOT clear to me that the patient requires a Neurosurgical intervention but will defer to Dr. Christella Noa when he takes over  We already have the pathogen which is Streptococcus intermedius which is quite PCN sensitive  I suspect the source of this infection is likely odontogenic.  I would discuss with Cardiology  TEE to evaluate his heart valves best  I will get CT MF to assess for odontogenic source  I am going to treat him for 6 weeks with either ceftriaxone or high-dose penicillin regardless given concern for diskitis  and will discuss to options with him tomorrow.         LOS: 4 days   Alcide Evener 02/25/2016, 4:44 PM

## 2016-02-25 NOTE — Progress Notes (Signed)
  Echocardiogram 2D Echocardiogram has been performed.  Jonathan Holden 02/25/2016, 11:04 AM

## 2016-02-26 DIAGNOSIS — R7881 Bacteremia: Secondary | ICD-10-CM

## 2016-02-26 LAB — CBC WITH DIFFERENTIAL/PLATELET
BASOS ABS: 0 10*3/uL (ref 0.0–0.1)
Basophils Relative: 0 %
EOS PCT: 0 %
Eosinophils Absolute: 0 10*3/uL (ref 0.0–0.7)
HCT: 35.8 % — ABNORMAL LOW (ref 39.0–52.0)
HEMOGLOBIN: 12.4 g/dL — AB (ref 13.0–17.0)
LYMPHS PCT: 5 %
Lymphs Abs: 0.6 10*3/uL — ABNORMAL LOW (ref 0.7–4.0)
MCH: 32 pg (ref 26.0–34.0)
MCHC: 34.6 g/dL (ref 30.0–36.0)
MCV: 92.3 fL (ref 78.0–100.0)
Monocytes Absolute: 0.8 10*3/uL (ref 0.1–1.0)
Monocytes Relative: 8 %
NEUTROS PCT: 87 %
Neutro Abs: 9.6 10*3/uL — ABNORMAL HIGH (ref 1.7–7.7)
PLATELETS: 256 10*3/uL (ref 150–400)
RBC: 3.88 MIL/uL — AB (ref 4.22–5.81)
RDW: 14.3 % (ref 11.5–15.5)
WBC: 11 10*3/uL — AB (ref 4.0–10.5)

## 2016-02-26 LAB — CBC
HEMATOCRIT: 34.6 % — AB (ref 39.0–52.0)
HEMOGLOBIN: 11.6 g/dL — AB (ref 13.0–17.0)
MCH: 31 pg (ref 26.0–34.0)
MCHC: 33.5 g/dL (ref 30.0–36.0)
MCV: 92.5 fL (ref 78.0–100.0)
Platelets: 239 10*3/uL (ref 150–400)
RBC: 3.74 MIL/uL — AB (ref 4.22–5.81)
RDW: 14.4 % (ref 11.5–15.5)
WBC: 11.4 10*3/uL — ABNORMAL HIGH (ref 4.0–10.5)

## 2016-02-26 LAB — C-REACTIVE PROTEIN: CRP: 11.4 mg/dL — ABNORMAL HIGH (ref <1.0)

## 2016-02-26 LAB — SEDIMENTATION RATE: Sed Rate: 82 mm/hr — ABNORMAL HIGH (ref 0–16)

## 2016-02-26 LAB — HEPARIN LEVEL (UNFRACTIONATED): HEPARIN UNFRACTIONATED: 0.38 [IU]/mL (ref 0.30–0.70)

## 2016-02-26 LAB — HIV ANTIBODY (ROUTINE TESTING W REFLEX): HIV SCREEN 4TH GENERATION: NONREACTIVE

## 2016-02-26 MED ORDER — ALBUTEROL SULFATE (2.5 MG/3ML) 0.083% IN NEBU
2.5000 mg | INHALATION_SOLUTION | Freq: Once | RESPIRATORY_TRACT | Status: AC
  Administered 2016-02-26: 2.5 mg via RESPIRATORY_TRACT
  Filled 2016-02-26: qty 3

## 2016-02-26 MED ORDER — PREDNISOLONE ACETATE 1 % OP SUSP
1.0000 [drp] | Freq: Three times a day (TID) | OPHTHALMIC | Status: DC
  Administered 2016-02-26 – 2016-02-28 (×5): 1 [drp] via OPHTHALMIC
  Filled 2016-02-26: qty 1

## 2016-02-26 MED ORDER — DILTIAZEM HCL 60 MG PO TABS
60.0000 mg | ORAL_TABLET | Freq: Four times a day (QID) | ORAL | Status: DC
  Administered 2016-02-26 – 2016-02-27 (×3): 60 mg via ORAL
  Filled 2016-02-26 (×3): qty 1

## 2016-02-26 MED ORDER — METOPROLOL TARTRATE 100 MG PO TABS
100.0000 mg | ORAL_TABLET | Freq: Two times a day (BID) | ORAL | Status: DC
  Administered 2016-02-26 – 2016-02-27 (×2): 100 mg via ORAL
  Filled 2016-02-26 (×2): qty 1

## 2016-02-26 MED ORDER — METOPROLOL TARTRATE 50 MG PO TABS
50.0000 mg | ORAL_TABLET | Freq: Once | ORAL | Status: AC
  Administered 2016-02-26: 50 mg via ORAL
  Filled 2016-02-26: qty 1

## 2016-02-26 MED ORDER — PENICILLIN G POTASSIUM 5000000 UNITS IJ SOLR
4.0000 10*6.[IU] | INTRAVENOUS | Status: DC
  Administered 2016-02-26 – 2016-02-27 (×3): 4 10*6.[IU] via INTRAVENOUS
  Filled 2016-02-26 (×8): qty 4

## 2016-02-26 MED ORDER — HALOPERIDOL LACTATE 5 MG/ML IJ SOLN
1.0000 mg | Freq: Four times a day (QID) | INTRAMUSCULAR | Status: DC | PRN
Start: 2016-02-26 — End: 2016-02-28
  Administered 2016-02-26: 2 mg via INTRAVENOUS
  Filled 2016-02-26: qty 1

## 2016-02-26 NOTE — Consult Note (Signed)
Reason for Consult:Discitis Referring Physician: Vern Guerette is an 71 y.o. male.  HPI: whom had by his account the acute onset of back pain after pushing a cart "full of fabric". He sought medical attention, was treated for possible back strain with steriods, pain medications,muscle relaxants, bed rest. He did not improve and was admitted after being confused, febrile, with increased pain.Noted on blood cultures to have streptococcal bacteremia. He has been on Abx since admission. Switched today to penicillin. He has afib, and RVR. Fever curve improved, wbc decreasing.   Past Medical History:  Diagnosis Date  . Arthritis HANDS AND RIGHT SHOULDER  . Coronary artery disease CARDIOLOGIST- DR Daneen Schick- LAST VISIT 6 MON AGO--  REQUESTED NOTE, EKG, ECHO, STRESS TEST   PT DENIES S & S  . H/O gastric ulcer   . History of MI (myocardial infarction) 1992  . Hyperlipidemia   . Hypertension   . Loose body of right shoulder   . S/P coronary angioplasty     Past Surgical History:  Procedure Laterality Date  . CARDIOVASCULAR STRESS TEST  08-22-2008---  DR Daneen Schick   NO ISCHEMIA.   Marland Kitchen CORONARY ANGIOPLASTY  1992  . ERCP W/ SPHICTEROTOMY  12-15-2002   AND STONE EXTRACTION  . LAPAROSCOPIC CHOLECYSTECTOMY  12-16-2002  . LUMBAR LAMINECTOMY  20 YRS AGO   L5    Family History  Problem Relation Age of Onset  . Other Mother     AGE 37 HEALTHY  . Heart attack Father 71    2 MI  . Emphysema    . Other Brother     HEALTHY    Social History:  reports that he quit smoking about 25 years ago. His smoking use included Cigarettes. He has a 40.00 pack-year smoking history. He has never used smokeless tobacco. He reports that he drinks about 4.2 oz of alcohol per week . He reports that he does not use drugs.  Allergies:  Allergies  Allergen Reactions  . Other Other (See Comments)    Pt reports allergic reaction to melons - unknown reaction  . Ciprofloxacin Hives    Medications: I  have reviewed the patient's current medications.  Results for orders placed or performed during the hospital encounter of 02/21/16 (from the past 48 hour(s))  Heparin level (unfractionated)     Status: None   Collection Time: 02/24/16 10:09 PM  Result Value Ref Range   Heparin Unfractionated 0.45 0.30 - 0.70 IU/mL    Comment:        IF HEPARIN RESULTS ARE BELOW EXPECTED VALUES, AND PATIENT DOSAGE HAS BEEN CONFIRMED, SUGGEST FOLLOW UP TESTING OF ANTITHROMBIN III LEVELS.   CBC     Status: Abnormal   Collection Time: 02/25/16  4:01 AM  Result Value Ref Range   WBC 12.6 (H) 4.0 - 10.5 K/uL   RBC 3.78 (L) 4.22 - 5.81 MIL/uL   Hemoglobin 12.1 (L) 13.0 - 17.0 g/dL   HCT 35.2 (L) 39.0 - 52.0 %   MCV 93.1 78.0 - 100.0 fL   MCH 32.0 26.0 - 34.0 pg   MCHC 34.4 30.0 - 36.0 g/dL   RDW 14.7 11.5 - 15.5 %   Platelets 207 150 - 400 K/uL  Heparin level (unfractionated)     Status: None   Collection Time: 02/25/16  4:01 AM  Result Value Ref Range   Heparin Unfractionated 0.32 0.30 - 0.70 IU/mL    Comment:        IF HEPARIN  RESULTS ARE BELOW EXPECTED VALUES, AND PATIENT DOSAGE HAS BEEN CONFIRMED, SUGGEST FOLLOW UP TESTING OF ANTITHROMBIN III LEVELS.   Comprehensive metabolic panel     Status: Abnormal   Collection Time: 02/25/16  4:01 AM  Result Value Ref Range   Sodium 134 (L) 135 - 145 mmol/L   Potassium 4.2 3.5 - 5.1 mmol/L    Comment: DELTA CHECK NOTED   Chloride 104 101 - 111 mmol/L   CO2 19 (L) 22 - 32 mmol/L   Glucose, Bld 134 (H) 65 - 99 mg/dL   BUN 18 6 - 20 mg/dL   Creatinine, Ser 0.85 0.61 - 1.24 mg/dL   Calcium 7.8 (L) 8.9 - 10.3 mg/dL   Total Protein 5.3 (L) 6.5 - 8.1 g/dL   Albumin 2.1 (L) 3.5 - 5.0 g/dL   AST 59 (H) 15 - 41 U/L   ALT 70 (H) 17 - 63 U/L   Alkaline Phosphatase 53 38 - 126 U/L   Total Bilirubin 0.7 0.3 - 1.2 mg/dL   GFR calc non Af Amer >60 >60 mL/min   GFR calc Af Amer >60 >60 mL/min    Comment: (NOTE) The eGFR has been calculated using the CKD EPI  equation. This calculation has not been validated in all clinical situations. eGFR's persistently <60 mL/min signify possible Chronic Kidney Disease.    Anion gap 11 5 - 15  CBC     Status: Abnormal   Collection Time: 02/26/16  2:35 AM  Result Value Ref Range   WBC 11.4 (H) 4.0 - 10.5 K/uL   RBC 3.74 (L) 4.22 - 5.81 MIL/uL   Hemoglobin 11.6 (L) 13.0 - 17.0 g/dL   HCT 34.6 (L) 39.0 - 52.0 %   MCV 92.5 78.0 - 100.0 fL   MCH 31.0 26.0 - 34.0 pg   MCHC 33.5 30.0 - 36.0 g/dL   RDW 14.4 11.5 - 15.5 %   Platelets 239 150 - 400 K/uL  Heparin level (unfractionated)     Status: None   Collection Time: 02/26/16  2:35 AM  Result Value Ref Range   Heparin Unfractionated 0.38 0.30 - 0.70 IU/mL    Comment:        IF HEPARIN RESULTS ARE BELOW EXPECTED VALUES, AND PATIENT DOSAGE HAS BEEN CONFIRMED, SUGGEST FOLLOW UP TESTING OF ANTITHROMBIN III LEVELS.   Sedimentation rate     Status: Abnormal   Collection Time: 02/26/16  2:35 AM  Result Value Ref Range   Sed Rate 82 (H) 0 - 16 mm/hr  C-reactive protein     Status: Abnormal   Collection Time: 02/26/16  2:35 AM  Result Value Ref Range   CRP 11.4 (H) <1.0 mg/dL  HIV antibody     Status: None   Collection Time: 02/26/16  2:35 AM  Result Value Ref Range   HIV Screen 4th Generation wRfx Non Reactive Non Reactive    Comment: (NOTE) Performed At: [facility] [place], [place] [id number] Lindon Romp MD [id number]   CBC with Differential/Platelet     Status: Abnormal   Collection Time: 02/26/16  4:25 PM  Result Value Ref Range   WBC 11.0 (H) 4.0 - 10.5 K/uL   RBC 3.88 (L) 4.22 - 5.81 MIL/uL   Hemoglobin 12.4 (L) 13.0 - 17.0 g/dL   HCT 35.8 (L) 39.0 - 52.0 %   MCV 92.3 78.0 - 100.0 fL   MCH 32.0 26.0 - 34.0 pg   MCHC 34.6 30.0 - 36.0 g/dL  RDW 14.3 11.5 - 15.5 %   Platelets 256 150 - 400 K/uL   Neutrophils Relative % 87 %   Neutro Abs 9.6 (H) 1.7 - 7.7 K/uL   Lymphocytes Relative 5 %   Lymphs Abs 0.6  (L) 0.7 - 4.0 K/uL   Monocytes Relative 8 %   Monocytes Absolute 0.8 0.1 - 1.0 K/uL   Eosinophils Relative 0 %   Eosinophils Absolute 0.0 0.0 - 0.7 K/uL   Basophils Relative 0 %   Basophils Absolute 0.0 0.0 - 0.1 K/uL    Ct Maxillofacial W Contrast  Addendum Date: 02/26/2016   ADDENDUM REPORT: 02/26/2016 17:06 ADDENDUM: There is mild lucency surrounding the roots of the most posterior remaining right mandibular molar. Otherwise, there is no abnormality of the remaining teeth. I discussed this on 02/26/2016 at 5:03 pm with Dr. Rhina Brackett DAM . Electronically Signed   By: Ulyses Jarred M.D.   On: 02/26/2016 17:06   Result Date: 02/26/2016 CLINICAL DATA:  Bacteremia.  Investigation for source. EXAM: CT MAXILLOFACIAL WITH CONTRAST TECHNIQUE: Multidetector CT imaging of the maxillofacial structures was performed. Multiplanar CT image reconstructions were also generated. A small metallic BB was placed on the right temple in order to reliably differentiate right from left. COMPARISON:  None. FINDINGS: Osseous: No facial fracture. Orbits: There is a left scleral band. Bilateral lens replacements. Otherwise normal. Sinuses --Frontal: There is mild left frontal sinus mucosal thickening inferiorly with suspected occlusion of the left frontal recess. No right frontal sinus mucosal thickening. The right frontal recess is patent. --Ethmoid: Minimal ethmoid sinus mucosal thickening without fluid levels or osseous changes. --Sphenoid: Minimal sphenoid mucosal thickening without fluid levels or osseous abnormalities. Sphenoethmoidal recesses and sphenoid ostia are patent. --Maxillary: Bilateral minimal maxillary mucosal thickening without fluid levels or osseous abnormalities. There is bilateral narrowing of the infundibular recesses. --Nasal cavity: 4 mm of rightward septal deviation. --Nasopharynx: Clear. --Mastoid air cells: No effusion. Soft tissues: Normal. Limited intracranial: Normal. IMPRESSION: Very mild mucosal  thickening of all paranasal sinuses without evidence of acute sinusitis. 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 Electronically Signed: By: Ulyses Jarred M.D. On: 02/25/2016 19:49    Review of Systems  Constitutional: Positive for fever and malaise/fatigue.  HENT: Negative.   Respiratory: Negative.   Musculoskeletal: Positive for back pain.  Skin: Negative.   Neurological: Positive for focal weakness and weakness.  Endo/Heme/Allergies: Negative.    Blood pressure (!) 157/121, pulse (!) 119, temperature 98.1 F (36.7 C), temperature source Oral, resp. rate 19, height 6' 1"  (1.854 m), weight 100.1 kg (220 lb 10.9 oz), SpO2 99 %. Physical Exam  Constitutional: He is oriented to person, place, and time. He appears well-developed and well-nourished. He appears distressed.  HENT:  Head: Normocephalic and atraumatic.  Eyes: Conjunctivae and EOM are normal. Pupils are equal, round, and reactive to light.  Neck: Normal range of motion. Neck supple.  Cardiovascular:  Rapid heart rate  Respiratory: Effort normal.  GI: Soft. Bowel sounds are normal.  Musculoskeletal: Normal range of motion.  Neurological: He is alert and oriented to person, place, and time. He has normal strength. No cranial nerve deficit or sensory deficit. Coordination normal. He displays no Babinski's sign on the right side. He displays no Babinski's sign on the left side.  Mildly confused, answers all questions Did not assess gait. Proprioception intact    Assessment/Plan: No current need for surgical intervention. Will continue to follow. Agree with Dr. Charlestine Night assesment at this  time. Would obtain a brace may help pain when patient is up. kc  Tiona Ruane L 02/26/2016, 7:13 PM

## 2016-02-26 NOTE — Progress Notes (Signed)
Pt partner standing in hallway. She stated pt cell phone was missing. Pt cell phone was in the room with pt prior to her arrival. She stated his phone was on his chest when she left. Pt was in the chair when she left and  pt requested to go back to bed at this time I didn't see pt cell phone. Another RN saw partner walk out of the room with her duffle bag she carried in to spend the night with. Partner did not have duffel bag once she returned and stated she would look for phone once she gets back to bag.This nurse and the tech searched the room/ laundry bags, closets, cabinets and bed but unable to find pt cellphone. Pt is asleep during this time. This information will be passed to day shift as well. Will continue to monitor.

## 2016-02-26 NOTE — Progress Notes (Addendum)
Subjective:  No new complaints.    Antibiotics:  Anti-infectives    Start     Dose/Rate Route Frequency Ordered Stop   02/22/16 2130  vancomycin (VANCOCIN) 1,250 mg in sodium chloride 0.9 % 250 mL IVPB  Status:  Discontinued     1,250 mg 166.7 mL/hr over 90 Minutes Intravenous Every 12 hours 02/22/16 1229 02/22/16 1626   02/22/16 1800  cefTRIAXone (ROCEPHIN) 2 g in dextrose 5 % 50 mL IVPB     2 g 100 mL/hr over 30 Minutes Intravenous Every 24 hours 02/22/16 1626     02/21/16 2200  oseltamivir (TAMIFLU) capsule 75 mg  Status:  Discontinued     75 mg Oral 2 times daily 02/21/16 1516 02/22/16 1407   02/21/16 1800  vancomycin (VANCOCIN) IVPB 1000 mg/200 mL premix  Status:  Discontinued     1,000 mg 200 mL/hr over 60 Minutes Intravenous Every 8 hours 02/21/16 1138 02/22/16 1229   02/21/16 1800  piperacillin-tazobactam (ZOSYN) IVPB 3.375 g  Status:  Discontinued     3.375 g 12.5 mL/hr over 240 Minutes Intravenous Every 8 hours 02/21/16 1138 02/22/16 1626   02/21/16 1015  piperacillin-tazobactam (ZOSYN) IVPB 3.375 g     3.375 g 100 mL/hr over 30 Minutes Intravenous  Once 02/21/16 1002 02/21/16 1100   02/21/16 1015  vancomycin (VANCOCIN) IVPB 1000 mg/200 mL premix     1,000 mg 200 mL/hr over 60 Minutes Intravenous  Once 02/21/16 1002 02/21/16 1132   02/21/16 0730  oseltamivir (TAMIFLU) capsule 75 mg     75 mg Oral  Once 02/21/16 [id number] 02/21/16 0802      Medications: Scheduled Meds: . Alirocumab  150 mg Subcutaneous Q14 Days  . atorvastatin  40 mg Oral Daily  . cefTRIAXone (ROCEPHIN)  IV  2 g Intravenous Q24H  . diltiazem  60 mg Oral Q6H  . famotidine  20 mg Oral BID  . folic acid  1 mg Oral Daily  . lidocaine  2 patch Transdermal Q24H  . lisinopril  20 mg Oral Daily  . mouth rinse  15 mL Mouth Rinse BID  . metoprolol tartrate  100 mg Oral BID  . multivitamin with minerals  1 tablet Oral Daily  . pantoprazole  40 mg Oral Q1200  . pneumococcal 23 valent vaccine  0.5  mL Intramuscular Tomorrow-1000  . prednisoLONE acetate  1 drop Left Eye TID  . thiamine  100 mg Oral Daily   Continuous Infusions: . heparin 2,400 Units/hr (02/26/16 1251)   PRN Meds:.acetaminophen **OR** acetaminophen, alum & mag hydroxide-simeth, gi cocktail, hydrALAZINE, loperamide, Melatonin, metoprolol, ondansetron **OR** ondansetron (ZOFRAN) IV, polyethylene glycol    Objective: Weight change: 14.1 oz (0.4 kg)  Intake/Output Summary (Last 24 hours) at 02/26/16 1656 Last data filed at 02/26/16 1300  Gross per 24 hour  Intake          1315.83 ml  Output             2250 ml  Net          -934.17 ml   Blood pressure (!) 151/108, pulse (!) 118, temperature 98.1 F (36.7 C), temperature source Oral, resp. rate (!) 21, height 6\' 1"  (1.854 m), weight 220 lb 10.9 oz (100.1 kg), SpO2 100 %. Temp:  [98.1 F (36.7 C)-100.1 F (37.8 C)] 98.1 F (36.7 C) (02/07 1230) Pulse Rate:  [116-124] 118 (02/07 1230) Resp:  [21-26] 21 (02/07 1230) BP: (119-169)/(61-108) 151/108 (02/07 1230) SpO2:  [  97 %-100 %] 100 % (02/07 1230) Weight:  [220 lb 10.9 oz (100.1 kg)] 220 lb 10.9 oz (100.1 kg) (02/07 0454)  Physical Exam: General: Alert and awake, oriented x3, not in any acute distress. HEENT: anicteric sclera,EOMI, dentition is poor in areas CVS regular rate, normal r,  no murmur rubs or gallops Chest: clear to auscultation bilaterally, no wheezing, rales or rhonchi Abdomen: soft nontender, nondistended, normal bowel sounds, Extremities: no  clubbing or edema noted bilaterally Skin: no rashes,  Neuro: nonfocal  CBC:  CBC Latest Ref Rng & Units 02/26/2016 02/25/2016 02/24/2016  WBC 4.0 - 10.5 K/uL 11.4(H) 12.6(H) 13.3(H)  Hemoglobin 13.0 - 17.0 g/dL 11.6(L) 12.1(L) 12.3(L)  Hematocrit 39.0 - 52.0 % 34.6(L) 35.2(L) 36.1(L)  Platelets 150 - 400 K/uL 239 207 183      BMET  Recent Labs  02/24/16 0139 02/25/16 0401  NA 133* 134*  K 3.3* 4.2  CL 105 104  CO2 20* 19*  GLUCOSE 129* 134*    BUN 20 18  CREATININE 0.82 0.85  CALCIUM 7.6* 7.8*     Liver Panel   Recent Labs  02/24/16 0139 02/25/16 0401  PROT 5.4* 5.3*  ALBUMIN 2.0* 2.1*  AST 60* 59*  ALT 49 70*  ALKPHOS 57 53  BILITOT 0.6 0.7       Sedimentation Rate  Recent Labs  02/26/16 0235  ESRSEDRATE 82*   C-Reactive Protein  Recent Labs  02/26/16 0235  CRP 11.4*    Micro Results: Recent Results (from the past 720 hour(s))  Blood Culture (routine x 2)     Status: Abnormal (Preliminary result)   Collection Time: 02/21/16  6:09 AM  Result Value Ref Range Status   Specimen Description BLOOD RIGHT ARM  Final   Special Requests BOTTLES DRAWN AEROBIC AND ANAEROBIC 5ML  Final   Culture  Setup Time   Final    GRAM POSITIVE COCCI IN CHAINS IN BOTH AEROBIC AND ANAEROBIC BOTTLES CRITICAL RESULT CALLED TO, READ BACK BY AND VERIFIED WITH: E MARTIN,PHARMD AT 1600 02/22/16 BY J FUDESCO    Culture (A)  Final    STREPTOCOCCUS INTERMEDIUS SUSCEPTIBILITIES PERFORMED ON PREVIOUS CULTURE WITHIN THE LAST 5 DAYS.    Report Status PENDING  Incomplete  Blood Culture ID Panel (Reflexed)     Status: Abnormal   Collection Time: 02/21/16  6:09 AM  Result Value Ref Range Status   Enterococcus species NOT DETECTED NOT DETECTED Final   Listeria monocytogenes NOT DETECTED NOT DETECTED Final   Staphylococcus species NOT DETECTED NOT DETECTED Final   Staphylococcus aureus NOT DETECTED NOT DETECTED Final   Streptococcus species DETECTED (A) NOT DETECTED Final    Comment: Not Enterococcus species, Streptococcus agalactiae, Streptococcus pyogenes, or Streptococcus pneumoniae. CRITICAL RESULT CALLED TO, READ BACK BY AND VERIFIED WITH: E. MARTIN, 02/22/16 AT 1600 BY J FUDESCO    Streptococcus agalactiae NOT DETECTED NOT DETECTED Final   Streptococcus pneumoniae NOT DETECTED NOT DETECTED Final   Streptococcus pyogenes NOT DETECTED NOT DETECTED Final   Acinetobacter baumannii NOT DETECTED NOT DETECTED Final    Enterobacteriaceae species NOT DETECTED NOT DETECTED Final   Enterobacter cloacae complex NOT DETECTED NOT DETECTED Final   Escherichia coli NOT DETECTED NOT DETECTED Final   Klebsiella oxytoca NOT DETECTED NOT DETECTED Final   Klebsiella pneumoniae NOT DETECTED NOT DETECTED Final   Proteus species NOT DETECTED NOT DETECTED Final   Serratia marcescens NOT DETECTED NOT DETECTED Final   Haemophilus influenzae NOT DETECTED NOT DETECTED Final  Neisseria meningitidis NOT DETECTED NOT DETECTED Final   Pseudomonas aeruginosa NOT DETECTED NOT DETECTED Final   Candida albicans NOT DETECTED NOT DETECTED Final   Candida glabrata NOT DETECTED NOT DETECTED Final   Candida krusei NOT DETECTED NOT DETECTED Final   Candida parapsilosis NOT DETECTED NOT DETECTED Final   Candida tropicalis NOT DETECTED NOT DETECTED Final  Blood Culture (routine x 2)     Status: Abnormal   Collection Time: 02/21/16  6:17 AM  Result Value Ref Range Status   Specimen Description BLOOD LEFT ARM  Final   Special Requests BOTTLES DRAWN AEROBIC AND ANAEROBIC 5ML  Final   Culture  Setup Time   Final    GRAM POSITIVE COCCI IN CHAINS ANAEROBIC BOTTLE ONLY CRITICAL VALUE NOTED.  VALUE IS CONSISTENT WITH PREVIOUSLY REPORTED AND CALLED VALUE.    Culture STREPTOCOCCUS INTERMEDIUS (A)  Final   Report Status 02/25/2016 FINAL  Final   Organism ID, Bacteria STREPTOCOCCUS INTERMEDIUS  Final      Susceptibility   Streptococcus intermedius - MIC*    PENICILLIN <=0.06 SENSITIVE Sensitive     CEFTRIAXONE 0.25 SENSITIVE Sensitive     ERYTHROMYCIN <=0.12 SENSITIVE Sensitive     LEVOFLOXACIN 0.5 SENSITIVE Sensitive     VANCOMYCIN 0.5 SENSITIVE Sensitive     * STREPTOCOCCUS INTERMEDIUS  Urine culture     Status: Abnormal   Collection Time: 02/21/16  6:26 AM  Result Value Ref Range Status   Specimen Description URINE, RANDOM  Final   Special Requests NONE  Final   Culture <10,000 COLONIES/mL INSIGNIFICANT GROWTH (A)  Final   Report  Status 02/22/2016 FINAL  Final  Respiratory Panel by PCR     Status: None   Collection Time: 02/21/16  1:20 PM  Result Value Ref Range Status   Adenovirus NOT DETECTED NOT DETECTED Final   Coronavirus 229E NOT DETECTED NOT DETECTED Final   Coronavirus HKU1 NOT DETECTED NOT DETECTED Final   Coronavirus NL63 NOT DETECTED NOT DETECTED Final   Coronavirus OC43 NOT DETECTED NOT DETECTED Final   Metapneumovirus NOT DETECTED NOT DETECTED Final   Rhinovirus / Enterovirus NOT DETECTED NOT DETECTED Final   Influenza A NOT DETECTED NOT DETECTED Final   Influenza B NOT DETECTED NOT DETECTED Final   Parainfluenza Virus 1 NOT DETECTED NOT DETECTED Final   Parainfluenza Virus 2 NOT DETECTED NOT DETECTED Final   Parainfluenza Virus 3 NOT DETECTED NOT DETECTED Final   Parainfluenza Virus 4 NOT DETECTED NOT DETECTED Final   Respiratory Syncytial Virus NOT DETECTED NOT DETECTED Final   Bordetella pertussis NOT DETECTED NOT DETECTED Final   Chlamydophila pneumoniae NOT DETECTED NOT DETECTED Final   Mycoplasma pneumoniae NOT DETECTED NOT DETECTED Final  MRSA PCR Screening     Status: None   Collection Time: 02/21/16  8:43 PM  Result Value Ref Range Status   MRSA by PCR NEGATIVE NEGATIVE Final    Comment:        The GeneXpert MRSA Assay (FDA approved for NASAL specimens only), is one component of a comprehensive MRSA colonization surveillance program. It is not intended to diagnose MRSA infection nor to guide or monitor treatment for MRSA infections.   Stool culture (children & immunocomp patients)     Status: None   Collection Time: 02/22/16  9:31 PM  Result Value Ref Range Status   Salmonella/Shigella Screen Final report  Corrected    Comment: (NOTE) Performed At: [facility] [place], [place] [id number] Lindon Romp MD  [id number] CORRECTED ON 02/07 AT 1636: PREVIOUSLY REPORTED AS Preliminary report    Campylobacter Culture PENDING  Incomplete   E coli, Shiga  toxin Assay Negative Negative Final    Comment: (NOTE) Performed At: [facility] [place], [place] [id number] Lindon Romp MD [id number]   C difficile quick scan w PCR reflex     Status: None   Collection Time: 02/22/16  9:31 PM  Result Value Ref Range Status   C Diff antigen NEGATIVE NEGATIVE Final   C Diff toxin NEGATIVE NEGATIVE Final   C Diff interpretation No C. difficile detected.  Final  STOOL CULTURE REFLEX - RSASHR     Status: None   Collection Time: 02/22/16  9:31 PM  Result Value Ref Range Status   Stool Culture result 1 (RSASHR) Comment  Final    Comment: (NOTE) No Salmonella or Shigella recovered. Performed At: [facility] [place], [place] [id number] Lindon Romp MD [id number]   Culture, blood (Routine X 2) w Reflex to ID Panel     Status: None (Preliminary result)   Collection Time: 02/24/16  7:39 AM  Result Value Ref Range Status   Specimen Description BLOOD RIGHT HAND  Final   Special Requests BOTTLES DRAWN AEROBIC AND ANAEROBIC 5CC  Final   Culture NO GROWTH 2 DAYS  Final   Report Status PENDING  Incomplete  Culture, blood (routine x 2)     Status: None (Preliminary result)   Collection Time: 02/24/16  7:46 AM  Result Value Ref Range Status   Specimen Description BLOOD LEFT HAND  Final   Special Requests BOTTLES DRAWN AEROBIC AND ANAEROBIC 5CC  Final   Culture NO GROWTH 2 DAYS  Final   Report Status PENDING  Incomplete    Studies/Results: Ct Maxillofacial W Contrast  Result Date: 02/25/2016 CLINICAL DATA:  Bacteremia.  Investigation for source. EXAM: CT MAXILLOFACIAL WITH CONTRAST TECHNIQUE: Multidetector CT imaging of the maxillofacial structures was performed. Multiplanar CT image reconstructions were also generated. A small metallic BB was placed on the right temple in order to reliably differentiate right from left. COMPARISON:  None. FINDINGS: Osseous: No facial fracture. Orbits: There is a left  scleral band. Bilateral lens replacements. Otherwise normal. Sinuses --Frontal: There is mild left frontal sinus mucosal thickening inferiorly with suspected occlusion of the left frontal recess. No right frontal sinus mucosal thickening. The right frontal recess is patent. --Ethmoid: Minimal ethmoid sinus mucosal thickening without fluid levels or osseous changes. --Sphenoid: Minimal sphenoid mucosal thickening without fluid levels or osseous abnormalities. Sphenoethmoidal recesses and sphenoid ostia are patent. --Maxillary: Bilateral minimal maxillary mucosal thickening without fluid levels or osseous abnormalities. There is bilateral narrowing of the infundibular recesses. --Nasal cavity: 4 mm of rightward septal deviation. --Nasopharynx: Clear. --Mastoid air cells: No effusion. Soft tissues: Normal. Limited intracranial: Normal. IMPRESSION: Very mild mucosal thickening of all paranasal sinuses without evidence of acute sinusitis. 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 Electronically Signed   By: Ulyses Jarred M.D.   On: 02/25/2016 19:49      Assessment/Plan:  INTERVAL HISTORY:   MRI report that was scanned in does not mention disk space infection though Dr. 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 male with  Streptococcal bacteremia and diskitis L4-L5, L5-S1   It is NOT clear to me that the patient requires a Neurosurgical intervention but will defer to Dr. Christella Noa when he takes over  We already have the pathogen which is Streptococcus intermedius which is  quite PCN sensitive  I suspect the source of this infection is likely odontogenic.  I have reviewed CT MF with radiology and there is a little bit of LUCENCY around the right hand side of the posterior molar mandible. It is not esp remarkable per Dr. Collins [place] reviews of yesterays films  I would discuss with Cardiology  TEE to evaluate his heart valves best    I am going to treat him for 6 weeks with  Of  high-dose penicillin regardless given concern for diskitis   I spent greater than 35 minutes with the patient including greater than 50% of time in face to face counsel of the patient regarding his bacteremia discitis imaging findings antibiotic choices and in coordination of his care.       LOS: 5 days   Alcide Evener 02/26/2016, 4:56 PM

## 2016-02-26 NOTE — Progress Notes (Addendum)
Patient Name: Jonathan Holden Date of Encounter: 02/26/2016  Primary Cardiologist: Dr. 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 Asked me to call his nurse. No other complaints otherwise. Would like to be cleaned up before his brother gets here.   Inpatient Medications    Scheduled Meds: . Alirocumab  150 mg Subcutaneous Q14 Days  . atorvastatin  40 mg Oral Daily  . cefTRIAXone (ROCEPHIN)  IV  2 g Intravenous Q24H  . famotidine  20 mg Oral BID  . folic acid  1 mg Oral Daily  . lidocaine  2 patch Transdermal Q24H  . lisinopril  20 mg Oral Daily  . mouth rinse  15 mL Mouth Rinse BID  . metoprolol tartrate  50 mg Oral BID  . multivitamin with minerals  1 tablet Oral Daily  . pantoprazole  40 mg Oral Q1200  . pneumococcal 23 valent vaccine  0.5 mL Intramuscular Tomorrow-1000  . thiamine  100 mg Oral Daily   Continuous Infusions: . sodium chloride 10 mL/hr at 02/24/16 1344  . heparin 2,400 Units/hr (02/26/16 0203)   PRN Meds: acetaminophen **OR** acetaminophen, alum & mag hydroxide-simeth, gi cocktail, haloperidol lactate, hydrALAZINE, ketorolac, loperamide, Melatonin, metoprolol, ondansetron **OR** ondansetron (ZOFRAN) IV, polyethylene glycol   Vital Signs    Vitals:   02/26/16 0454 02/26/16 0626 02/26/16 0700 02/26/16 0800  BP:  (!) 169/107 (!) 150/107 (!) 152/93  Pulse:  (!) 119 (!) 124 (!) 123  Resp:  (!) 24 (!) 24 (!) 26  Temp:   98.4 F (36.9 C)   TempSrc:   Oral   SpO2:  97% 100% 100%  Weight: 220 lb 10.9 oz (100.1 kg)       Height:        Intake/Output Summary (Last 24 hours) at 02/26/16 1138 Last data filed at 02/26/16 0835  Gross per 24 hour  Intake          1001.83 ml  Output             1500 ml  Net          -498.17 ml   Filed Weights   02/23/16 0250 02/25/16 0323 02/26/16 0454  Weight: 223 lb 15.8 oz (101.6 kg) 219 lb 12.8 oz (99.7 kg) 220 lb 10.9 oz (100.1 kg)    Physical Exam   GEN: Well nourished, well developed, in no acute distress.  HEENT: Grossly normal.  Neck: Supple, no JVD, carotid bruits, or masses. Cardiac: irreg irreg, tachycardic, no murmurs, rubs, or gallops. No clubbing, cyanosis, edema.  Radials/DP/PT 2+ and equal bilaterally.  Respiratory:  Respirations regular and unlabored, clear to auscultation bilaterally. GI: Soft, nontender, nondistended, BS + x 4. MS: no deformity or atrophy. Skin: warm and dry, no rash. Neuro:  Strength and sensation are intact. Psych: AAOx3.  Normal affect.  Labs    CBC  Recent Labs  02/25/16 0401 02/26/16 0235  WBC 12.6* 11.4*  HGB 12.1* 11.6*  HCT 35.2* 34.6*  MCV 93.1 92.5  PLT 207 [id number]   Basic Metabolic Panel  Recent Labs  02/24/16 0139 02/25/16 0401  NA 133* 134*  K 3.3* 4.2  CL 105 104  CO2 20* 19*  GLUCOSE 129* 134*  BUN 20 18  CREATININE 0.82 0.85  CALCIUM 7.6* 7.8*  MG 2.4  --    Liver Function Tests  Recent Labs  02/24/16 0139 02/25/16 0401  AST 60* 59*  ALT 49 70*  ALKPHOS 57 53  BILITOT 0.6 0.7  PROT 5.4* 5.3*  ALBUMIN 2.0* 2.1*     Telemetry    Afib with RVR with freq PVCs and bigeminy - Personally Reviewed  ECG    Afib with RVR with freq PVCs  - Personally Reviewed  Radiology    Ct Maxillofacial W Contrast  Result Date: 02/25/2016 CLINICAL DATA:  Bacteremia.  Investigation for source. EXAM: CT MAXILLOFACIAL WITH CONTRAST TECHNIQUE: Multidetector CT imaging of the maxillofacial structures was performed. Multiplanar CT image reconstructions were also generated. A small metallic BB was placed  on the right temple in order to reliably differentiate right from left. COMPARISON:  None. FINDINGS: Osseous: No facial fracture. Orbits: There is a left scleral band. Bilateral lens replacements. Otherwise normal. Sinuses --Frontal: There is mild left frontal sinus mucosal thickening inferiorly with suspected occlusion of the left frontal recess. No right frontal sinus mucosal thickening. The right frontal recess is patent. --Ethmoid: Minimal ethmoid sinus mucosal thickening without fluid levels or osseous changes. --Sphenoid: Minimal sphenoid mucosal thickening without fluid levels or osseous abnormalities. Sphenoethmoidal recesses and sphenoid ostia are patent. --Maxillary: Bilateral minimal maxillary mucosal thickening without fluid levels or osseous abnormalities. There is bilateral narrowing of the infundibular recesses. --Nasal cavity: 4 mm of rightward septal deviation. --Nasopharynx: Clear. --Mastoid air cells: No effusion. Soft tissues: Normal. Limited intracranial: Normal. IMPRESSION: Very mild mucosal thickening of all paranasal sinuses without evidence of acute sinusitis. 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 Electronically Signed   By: Ulyses Jarred M.D.   On: 02/25/2016 19:49    Cardiac Studies   2D ECHO: 02/25/2016 LV EF: 55%-60% Study Conclusions - Left ventricle: The cavity size was normal. There was mild focal   basal hypertrophy of the septum. Systolic function was normal.   The estimated ejection fraction was in the range of 55% to 60%.   Wall motion was normal; there were no regional wall motion   abnormalities. The study is not technically sufficient to allow   evaluation of LV diastolic function. - Aortic valve: Trileaflet; moderately thickened, mildly calcified   leaflets. - Tricuspid valve: There was trivial regurgitation. - Pulmonary arteries: Systolic pressure was moderately increased.   PA peak pressure: 47 mm Hg (S). Impressions: -  There was no evidence of a vegetation.   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 He was found to have sepsis with streptococcal bacteremia. In the ED the patient was found to be in SVT and was given adenosine which revealed A. fib with RVR and cardiology consulted.    Assessment & Plan    Afib with RVR (new diagnosis of afib): he remains tachycardic. HR in 130s. Currently on Lopressor 50mg  BID. Dilt gtt discontinued yesterday. Will increase Lopressor to 100 mg BID ( he has plenty of room in BP). AM dose already given, so will given  an extra 50mg  x1 now.  -- 2D ECHO with normal EF and mod increased PA pressure (47) -- CHADSVASC of at least 3 (HTN, age, vasc dz). Continue IV heparin until decision made on whether he needs neurosurgery for diskitis.   Staph bacteremia: ID requesting TEE. Felt to be odontogenic, getting CT MF to assess for odontogenic source  Vetebral disc discitis: per Dr. Tommy Medal, no surgical intervention planned at this point, but waiting for Dr. Megan Salon to decide when he assumes care of pt  HTN: BP elevated. Increase lopressor as above  CAD: hx of remote MI. Troponin mildly elevated with flat trend c/w demand ischemia in the setting of afib with RVR and sepsis  Signed, Angelena Form, PA-C  02/26/2016, 11:38 AM    Attending Note:   The patient was seen and examined.  Agree with assessment and plan as noted above.  Changes made to the above note as needed.  Patient seen and independently examined with Nell Range, PA .   We discussed all aspects of the encounter. I agree with the assessment and plan as stated above.  1.  AF with RVR.   Will add Diltiazem 60 Q 6.  Continue metoprolol 100 BID   2. Bacteremia:   ID has requested a TEE .  Will try to arrange    I have spent a total of 40 minutes with patient  reviewing hospital  notes , telemetry, EKGs, labs and examining patient as well as establishing an assessment and plan that was discussed with the patient. > 50% of time was spent in direct patient care.    Thayer Headings, Brooke Bonito., MD, [facility] 02/26/2016, 4:08 PM [address]  [place] Pager [phone]

## 2016-02-26 NOTE — Progress Notes (Signed)
Pt has audible wheezing. MD paged for breathing treatment. One time order received. Will continue to monitor.

## 2016-02-26 NOTE — Progress Notes (Signed)
Subjective:    Patient reports pain as 2 on 0-10 scale. He is improving in regards to his mental state. No neurological issues. WBC is steadily decreasing. Sinus studies are unremarkable.Would recommend that we get him up for therapy.   Objective: Vital signs in last 24 hours: Temp:  [98 F (36.7 C)-100.1 F (37.8 C)] 100.1 F (37.8 C) (02/07 0400) Pulse Rate:  [93-122] 119 (02/07 0626) Resp:  [15-29] 24 (02/07 0626) BP: (116-169)/(61-107) 169/107 (02/07 0626) SpO2:  [97 %-98 %] 97 % (02/07 0626) Weight:  [100.1 kg (220 lb 10.9 oz)] 100.1 kg (220 lb 10.9 oz) (02/07 0454)  Intake/Output from previous day: 02/06 0701 - 02/07 0700 In: 726 [P.O.:420; I.V.:306] Out: 1400 [Urine:1400] Intake/Output this shift: No intake/output data recorded.   Recent Labs  02/24/16 0139 02/25/16 0401 02/26/16 0235  HGB 12.3* 12.1* 11.6*    Recent Labs  02/25/16 0401 02/26/16 0235  WBC 12.6* 11.4*  RBC 3.78* 3.74*  HCT 35.2* 34.6*  PLT 207 239    Recent Labs  02/24/16 0139 02/25/16 0401  NA 133* 134*  K 3.3* 4.2  CL 105 104  CO2 20* 19*  BUN 20 18  CREATININE 0.82 0.85  GLUCOSE 129* 134*  CALCIUM 7.6* 7.8*   No results for input(s): LABPT, INR in the last 72 hours.  Neurologically intact  Assessment/Plan:    Up with therapy  Diana Davenport A 02/26/2016, 7:10 AM

## 2016-02-26 NOTE — Progress Notes (Signed)
ANTICOAGULATION CONSULT NOTE - Follow Up Consult  Pharmacy Consult for Heparin  Indication: atrial fibrillation  Allergies  Allergen Reactions  . Other Other (See Comments)    Pt reports allergic reaction to melons - unknown reaction  . Ciprofloxacin Hives   Patient Measurements: Height: 6\' 1"  (185.4 cm) Weight: 220 lb 10.9 oz (100.1 kg) IBW/kg (Calculated) : 79.9  Vital Signs: Temp: 98.4 F (36.9 C) (02/07 0700) Temp Source: Oral (02/07 0700) BP: 150/107 (02/07 0700) Pulse Rate: 124 (02/07 0700)  Labs:  Recent Labs  02/24/16 0139  02/24/16 2209 02/25/16 0401 02/26/16 0235  HGB 12.3*  --   --  12.1* 11.6*  HCT 36.1*  --   --  35.2* 34.6*  PLT 183  --   --  207 239  HEPARINUNFRC  --   < > 0.45 0.32 0.38  CREATININE 0.82  --   --  0.85  --   < > = values in this interval not displayed.  Estimated Creatinine Clearance: 99.2 mL/min (by C-G formula based on SCr of 0.85 mg/dL).   Assessment: Heparin for atrial fibrillation.  Heparin level remains therapeutic this am at 0.38.  Awaiting to see if surgery is required before transitioning to oral anticoagulation.   Goal of Therapy:  Heparin level 0.3-0.7 units/ml Monitor platelets by anticoagulation protocol: Yes   Plan:  -Continue heparin infusion at 2400 units/hr -Daily heparin level and CBC  -Await decision on surgical plans   Vincenza Hews, PharmD, BCPS 02/26/2016, 8:02 AM

## 2016-02-26 NOTE — Progress Notes (Signed)
Jonathan Holden TEAM 1 - [place]  [id number] DOB: [birth year] DOA: 02/21/2016 PCP: Sinclair Grooms, MD    Brief Narrative:  71 y.o. male with history of CAD s/p angioplasty, HTN, hyperlipidemia, and arthritis who presented to the ED with altered mental status. 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 He visited a physician and was given a prescription for steroids and hydrocodone, which he said made him feel "weird". His back pain did not improve and he sought medical attention again. On his return visit the patient was given Flexeril and Percocet in addition to his steroids.  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  The pt was instructed to present to the ED.      In the ED the patient was found to be in SVT and was given adenosine which revealed A. fib with RVR. He was started on a diltiazem drip after which he spontaneously converted to normal sinus rhythm.  Subjective: The patient is alert and conversant.  He denies chest pain shortness breath fevers chills or even back pain at present.  He has however mildly confused.  He can tell me where he is and why he is here but he is also impulsively trying to get out of bed saying "how else am I going to get down there for that thing."  Assessment & Plan:  Sepsis of unclear source - Strep intermediua bacteremia (2 of 2 blood cx)  urine culture not revealing - CXR w/o evidence of infiltrate - flu/resp virus negative - there is concern about a possible epidural abscess, but it is not felt likely that pt will require a surgical intervention - Dr. Gladstone Lighter has contacted Dr. Lacy Duverney office at request of the pt, but he is currently out of town - no exam findings to suggest cord compression at this time - fever curve continues to trend down and WBC is normalizing -  ID following  Newly diagnosed Afib w/ RVR  TSH is normal - HR poorly controlled today, w/ Cards adjusting medical tx - CHA2DS2-VASc is at least 2 > heparin gtt - will need transition to [place] once clear surgical intervention not required  Diarrhea Cdiff testing negative - likely abx associated - trial of imodium has corrected this issue - flexiseal now out   Hypokalemia Due to GI losses - corrected - Mg is normal   CAD Remote inferior MI - asymptomatic at this time   HTN BP stable at present   HLD Cont lipitor  DVT prophylaxis: IV heparin   Code Status: FULL CODE Family Communication: No family present at time of exam today  Disposition Plan: SDU due to possible need to return to CCB gtt  Consultants:  [facility] Cardiology ID   Procedures: none  Antimicrobials:  Zosyn 2/2 > 2/3 Vanc 2/2 > 2/3 Rocephin 2/3 >  Objective: Blood pressure (!) 151/108, pulse (!) 118, temperature 98.1 F (36.7 C), temperature source Oral, resp. rate (!) 21, height 6\' 1"  (1.854 m), weight 100.1 kg (220 lb 10.9 oz), SpO2 100 %.  Intake/Output Summary (Last 24 hours) at 02/26/16 1427 Last data filed at 02/26/16 1300  Gross per 24 hour  Intake          1001.83 ml  Output  2600 ml  Net         -1598.17 ml   Filed Weights   02/23/16 0250 02/25/16 0323 02/26/16 0454  Weight: 101.6 kg (223 lb 15.8 oz) 99.7 kg (219 lb 12.8 oz) 100.1 kg (220 lb 10.9 oz)    Examination: General: No acute respiratory distress - mildly confused but fully alert  Lungs: Clear to auscultation bilaterally  Cardiovascular: irreg irreg w/ rate 100-120 Abdomen: Nontender, nondistended, soft, bowel sounds positive, no rebound Extremities: No significant cyanosis, clubbing, or edema bilateral lower extremities Neuro:  5/5 strength B LE - intact sensation touch B LE - no babinski B  CBC:  Recent Labs Lab 02/21/16 0612  02/21/16 2118 02/22/16 1054 02/23/16 0326 02/24/16 0139 02/25/16 0401  02/26/16 0235  WBC 11.0*  --  9.7 10.5 14.3* 13.3* 12.6* 11.4*  NEUTROABS 8.6*  --  7.5 8.4*  --   --   --   --   HGB 13.7  < > 12.2* 11.5* 12.0* 12.3* 12.1* 11.6*  HCT 38.9*  < > 35.2* 33.2* 34.6* 36.1* 35.2* 34.6*  MCV 91.5  --  92.6 92.2 92.0 92.1 93.1 92.5  PLT 136*  --  125* 130* 141* 183 207 239  < > = values in this interval not displayed. Basic Metabolic Panel:  Recent Labs Lab 02/21/16 1836  02/22/16 0120 02/22/16 0723 02/23/16 0326 02/24/16 0139 02/25/16 0401  NA  --   < > 136 137 134* 133* 134*  K  --   < > 3.8 4.8 3.0* 3.3* 4.2  CL  --   < > 106 107 103 105 104  CO2  --   < > 21* 21* 20* 20* 19*  GLUCOSE  --   < > 106* 97 124* 129* 134*  BUN  --   < > 18 19 17 20 18   CREATININE  --   < > 0.83 1.08 0.87 0.82 0.85  CALCIUM  --   < > 8.2* 7.9* 8.0* 7.6* 7.8*  MG 1.8  --   --   --   --  2.4  --   PHOS 3.0  --   --   --   --   --   --   < > = values in this interval not displayed. GFR: Estimated Creatinine Clearance: 99.2 mL/min (by C-G formula based on SCr of 0.85 mg/dL).  Liver Function Tests:  Recent Labs Lab 02/21/16 0612 02/23/16 0326 02/24/16 0139 02/25/16 0401  AST 30 35 60* 59*  ALT 26 28 49 70*  ALKPHOS 58 50 57 53  BILITOT 1.0 1.0 0.6 0.7  PROT 6.4* 5.5* 5.4* 5.3*  ALBUMIN 3.0* 2.2* 2.0* 2.1*    Recent Labs Lab 02/21/16 1836  LIPASE 16    Coagulation Profile:  Recent Labs Lab 02/21/16 0647  INR 1.26    Cardiac Enzymes:  Recent Labs Lab 02/21/16 1836 02/21/16 2118 02/21/16 2332 02/22/16 0723  TROPONINI 0.06* 0.03* 0.08* <0.03    Recent Results (from the past 240 hour(s))  Blood Culture (routine x 2)     Status: Abnormal (Preliminary result)   Collection Time: 02/21/16  6:09 AM  Result Value Ref Range Status   Specimen Description BLOOD RIGHT ARM  Final   Special Requests BOTTLES DRAWN AEROBIC AND ANAEROBIC 5ML  Final   Culture  Setup Time   Final    GRAM POSITIVE COCCI IN CHAINS IN BOTH AEROBIC AND ANAEROBIC  BOTTLES CRITICAL RESULT CALLED TO, READ BACK BY  AND VERIFIED WITH: E MARTIN,PHARMD AT 1600 02/22/16 BY J FUDESCO    Culture (A)  Final    STREPTOCOCCUS INTERMEDIUS SUSCEPTIBILITIES PERFORMED ON PREVIOUS CULTURE WITHIN THE LAST 5 DAYS.    Report Status PENDING  Incomplete  Blood Culture ID Panel (Reflexed)     Status: Abnormal   Collection Time: 02/21/16  6:09 AM  Result Value Ref Range Status   Enterococcus species NOT DETECTED NOT DETECTED Final   Listeria monocytogenes NOT DETECTED NOT DETECTED Final   Staphylococcus species NOT DETECTED NOT DETECTED Final   Staphylococcus aureus NOT DETECTED NOT DETECTED Final   Streptococcus species DETECTED (A) NOT DETECTED Final    Comment: Not Enterococcus species, Streptococcus agalactiae, Streptococcus pyogenes, or Streptococcus pneumoniae. CRITICAL RESULT CALLED TO, READ BACK BY AND VERIFIED WITH: E. MARTIN, 02/22/16 AT 1600 BY J FUDESCO    Streptococcus agalactiae NOT DETECTED NOT DETECTED Final   Streptococcus pneumoniae NOT DETECTED NOT DETECTED Final   Streptococcus pyogenes NOT DETECTED NOT DETECTED Final   Acinetobacter baumannii NOT DETECTED NOT DETECTED Final   Enterobacteriaceae species NOT DETECTED NOT DETECTED Final   Enterobacter cloacae complex NOT DETECTED NOT DETECTED Final   Escherichia coli NOT DETECTED NOT DETECTED Final   Klebsiella oxytoca NOT DETECTED NOT DETECTED Final   Klebsiella pneumoniae NOT DETECTED NOT DETECTED Final   Proteus species NOT DETECTED NOT DETECTED Final   Serratia marcescens NOT DETECTED NOT DETECTED Final   Haemophilus influenzae NOT DETECTED NOT DETECTED Final   Neisseria meningitidis NOT DETECTED NOT DETECTED Final   Pseudomonas aeruginosa NOT DETECTED NOT DETECTED Final   Candida albicans NOT DETECTED NOT DETECTED Final   Candida glabrata NOT DETECTED NOT DETECTED Final   Candida krusei NOT DETECTED NOT DETECTED Final   Candida parapsilosis NOT DETECTED NOT DETECTED Final   Candida tropicalis  NOT DETECTED NOT DETECTED Final  Blood Culture (routine x 2)     Status: Abnormal   Collection Time: 02/21/16  6:17 AM  Result Value Ref Range Status   Specimen Description BLOOD LEFT ARM  Final   Special Requests BOTTLES DRAWN AEROBIC AND ANAEROBIC 5ML  Final   Culture  Setup Time   Final    GRAM POSITIVE COCCI IN CHAINS ANAEROBIC BOTTLE ONLY CRITICAL VALUE NOTED.  VALUE IS CONSISTENT WITH PREVIOUSLY REPORTED AND CALLED VALUE.    Culture STREPTOCOCCUS INTERMEDIUS (A)  Final   Report Status 02/25/2016 FINAL  Final   Organism ID, Bacteria STREPTOCOCCUS INTERMEDIUS  Final      Susceptibility   Streptococcus intermedius - MIC*    PENICILLIN <=0.06 SENSITIVE Sensitive     CEFTRIAXONE 0.25 SENSITIVE Sensitive     ERYTHROMYCIN <=0.12 SENSITIVE Sensitive     LEVOFLOXACIN 0.5 SENSITIVE Sensitive     VANCOMYCIN 0.5 SENSITIVE Sensitive     * STREPTOCOCCUS INTERMEDIUS  Urine culture     Status: Abnormal   Collection Time: 02/21/16  6:26 AM  Result Value Ref Range Status   Specimen Description URINE, RANDOM  Final   Special Requests NONE  Final   Culture <10,000 COLONIES/mL INSIGNIFICANT GROWTH (A)  Final   Report Status 02/22/2016 FINAL  Final  Respiratory Panel by PCR     Status: None   Collection Time: 02/21/16  1:20 PM  Result Value Ref Range Status   Adenovirus NOT DETECTED NOT DETECTED Final   Coronavirus 229E NOT DETECTED NOT DETECTED Final   Coronavirus HKU1 NOT DETECTED NOT DETECTED Final   Coronavirus NL63 NOT DETECTED NOT DETECTED Final  Coronavirus OC43 NOT DETECTED NOT DETECTED Final   Metapneumovirus NOT DETECTED NOT DETECTED Final   Rhinovirus / Enterovirus NOT DETECTED NOT DETECTED Final   Influenza A NOT DETECTED NOT DETECTED Final   Influenza B NOT DETECTED NOT DETECTED Final   Parainfluenza Virus 1 NOT DETECTED NOT DETECTED Final   Parainfluenza Virus 2 NOT DETECTED NOT DETECTED Final   Parainfluenza Virus 3 NOT DETECTED NOT DETECTED Final   Parainfluenza Virus 4  NOT DETECTED NOT DETECTED Final   Respiratory Syncytial Virus NOT DETECTED NOT DETECTED Final   Bordetella pertussis NOT DETECTED NOT DETECTED Final   Chlamydophila pneumoniae NOT DETECTED NOT DETECTED Final   Mycoplasma pneumoniae NOT DETECTED NOT DETECTED Final  MRSA PCR Screening     Status: None   Collection Time: 02/21/16  8:43 PM  Result Value Ref Range Status   MRSA by PCR NEGATIVE NEGATIVE Final    Comment:        The GeneXpert MRSA Assay (FDA approved for NASAL specimens only), is one component of a comprehensive MRSA colonization surveillance program. It is not intended to diagnose MRSA infection nor to guide or monitor treatment for MRSA infections.   Stool culture (children & immunocomp patients)     Status: None (Preliminary result)   Collection Time: 02/22/16  9:31 PM  Result Value Ref Range Status   Salmonella/Shigella Screen Preliminary report  Final    Comment: (NOTE) Performed At: [facility] [place], [place] [id number] Lindon Romp MD [id number]    Campylobacter Culture PENDING  Incomplete   E coli, Shiga toxin Assay Negative Negative Final    Comment: (NOTE) Performed At: [facility] [place], [place] [id number] Lindon Romp MD [id number]   C difficile quick scan w PCR reflex     Status: None   Collection Time: 02/22/16  9:31 PM  Result Value Ref Range Status   C Diff antigen NEGATIVE NEGATIVE Final   C Diff toxin NEGATIVE NEGATIVE Final   C Diff interpretation No C. difficile detected.  Final  STOOL CULTURE REFLEX - RSASHR     Status: None   Collection Time: 02/22/16  9:31 PM  Result Value Ref Range Status   Stool Culture result 1 (RSASHR) Comment  Final    Comment: (NOTE) Microbiological testing to rule out the presence of possible pathogens is in progress. Performed At: [facility] [place], [place] [id number] Lindon Romp MD [id number]   Culture,  blood (Routine X 2) w Reflex to ID Panel     Status: None (Preliminary result)   Collection Time: 02/24/16  7:39 AM  Result Value Ref Range Status   Specimen Description BLOOD RIGHT HAND  Final   Special Requests BOTTLES DRAWN AEROBIC AND ANAEROBIC 5CC  Final   Culture NO GROWTH 1 DAY  Final   Report Status PENDING  Incomplete  Culture, blood (routine x 2)     Status: None (Preliminary result)   Collection Time: 02/24/16  7:46 AM  Result Value Ref Range Status   Specimen Description BLOOD LEFT HAND  Final   Special Requests BOTTLES DRAWN AEROBIC AND ANAEROBIC 5CC  Final   Culture NO GROWTH 1 DAY  Final   Report Status PENDING  Incomplete     Scheduled Meds: . Alirocumab  150 mg Subcutaneous Q14 Days  . atorvastatin  40 mg Oral Daily  . cefTRIAXone (ROCEPHIN)  IV  2 g Intravenous Q24H  . famotidine  20  mg Oral BID  . folic acid  1 mg Oral Daily  . lidocaine  2 patch Transdermal Q24H  . lisinopril  20 mg Oral Daily  . mouth rinse  15 mL Mouth Rinse BID  . metoprolol tartrate  100 mg Oral BID  . multivitamin with minerals  1 tablet Oral Daily  . pantoprazole  40 mg Oral Q1200  . pneumococcal 23 valent vaccine  0.5 mL Intramuscular Tomorrow-1000  . thiamine  100 mg Oral Daily   Continuous Infusions: . sodium chloride 10 mL/hr at 02/24/16 1344  . heparin 2,400 Units/hr (02/26/16 1251)     LOS: 5 days   Cherene Altes, MD [facility] Office  [phone] Pager - Text Page per Shea Evans as per below:  On-Call/Text Page:      [email or web address]      password [email or web address]  If 7PM-7AM, please contact night-coverage [email or web address] Password [email or web address] 02/26/2016, 2:27 PM

## 2016-02-27 ENCOUNTER — Inpatient Hospital Stay (HOSPITAL_COMMUNITY): Payer: Medicare Other

## 2016-02-27 DIAGNOSIS — M4646 Discitis, unspecified, lumbar region: Secondary | ICD-10-CM

## 2016-02-27 DIAGNOSIS — K567 Ileus, unspecified: Secondary | ICD-10-CM

## 2016-02-27 DIAGNOSIS — G8220 Paraplegia, unspecified: Secondary | ICD-10-CM

## 2016-02-27 DIAGNOSIS — I272 Pulmonary hypertension, unspecified: Secondary | ICD-10-CM

## 2016-02-27 DIAGNOSIS — G541 Lumbosacral plexus disorders: Secondary | ICD-10-CM

## 2016-02-27 LAB — CBC
HEMATOCRIT: 35.4 % — AB (ref 39.0–52.0)
HEMOGLOBIN: 12.2 g/dL — AB (ref 13.0–17.0)
MCH: 31.7 pg (ref 26.0–34.0)
MCHC: 34.5 g/dL (ref 30.0–36.0)
MCV: 91.9 fL (ref 78.0–100.0)
Platelets: 276 10*3/uL (ref 150–400)
RBC: 3.85 MIL/uL — ABNORMAL LOW (ref 4.22–5.81)
RDW: 14.3 % (ref 11.5–15.5)
WBC: 9.8 10*3/uL (ref 4.0–10.5)

## 2016-02-27 LAB — HEPARIN LEVEL (UNFRACTIONATED): Heparin Unfractionated: 0.35 IU/mL (ref 0.30–0.70)

## 2016-02-27 LAB — HEPATITIS B SURFACE ANTIGEN: Hepatitis B Surface Ag: NEGATIVE

## 2016-02-27 LAB — STOOL CULTURE: E COLI SHIGA TOXIN ASSAY: NEGATIVE

## 2016-02-27 LAB — STOOL CULTURE REFLEX - RSASHR

## 2016-02-27 LAB — STOOL CULTURE REFLEX - CMPCXR

## 2016-02-27 LAB — HEPATITIS C ANTIBODY (REFLEX): HCV Ab: 0.1 s/co ratio (ref 0.0–0.9)

## 2016-02-27 LAB — HCV COMMENT:

## 2016-02-27 MED ORDER — SODIUM CHLORIDE 0.9% FLUSH: 10.0000 mL | INTRAVENOUS | Status: DC | PRN

## 2016-02-27 MED ORDER — PENICILLIN G POTASSIUM 5000000 UNITS IJ SOLR
4.0000 10*6.[IU] | INTRAVENOUS | Status: DC
  Administered 2016-02-27: 4 10*6.[IU] via INTRAVENOUS
  Filled 2016-02-27 (×2): qty 4

## 2016-02-27 MED ORDER — OXYCODONE-ACETAMINOPHEN 5-325 MG PO TABS
1.0000 | ORAL_TABLET | ORAL | Status: DC | PRN
  Administered 2016-02-27 (×2): 1 via ORAL
  Filled 2016-02-27 (×2): qty 1

## 2016-02-27 MED ORDER — DILTIAZEM HCL 60 MG PO TABS: 120.0000 mg | ORAL_TABLET | Freq: Four times a day (QID) | ORAL | Status: DC

## 2016-02-27 MED ORDER — APIXABAN 5 MG PO TABS
5.0000 mg | ORAL_TABLET | Freq: Two times a day (BID) | ORAL | Status: DC
  Administered 2016-02-27 – 2016-02-28 (×3): 5 mg via ORAL
  Filled 2016-02-27 (×3): qty 1

## 2016-02-27 MED ORDER — OXYCODONE-ACETAMINOPHEN 5-325 MG PO TABS
1.0000 | ORAL_TABLET | ORAL | Status: DC | PRN
  Administered 2016-02-27: 1 via ORAL
  Filled 2016-02-27: qty 1

## 2016-02-27 MED ORDER — METOPROLOL TARTRATE 25 MG PO TABS
125.0000 mg | ORAL_TABLET | Freq: Two times a day (BID) | ORAL | Status: DC
  Administered 2016-02-27 – 2016-02-28 (×2): 125 mg via ORAL
  Filled 2016-02-27 (×2): qty 1

## 2016-02-27 MED ORDER — DILTIAZEM HCL 60 MG PO TABS: 90.0000 mg | ORAL_TABLET | Freq: Four times a day (QID) | ORAL | Status: DC

## 2016-02-27 MED ORDER — SODIUM CHLORIDE 0.9 % IV SOLN: INTRAVENOUS | Status: DC

## 2016-02-27 MED ORDER — HYDROCODONE-ACETAMINOPHEN 10-325 MG PO TABS
1.0000 | ORAL_TABLET | Freq: Once | ORAL | Status: AC
  Administered 2016-02-27: 1 via ORAL
  Filled 2016-02-27: qty 1

## 2016-02-27 MED ORDER — PENICILLIN G POTASSIUM 5000000 UNITS IJ SOLR
4.0000 10*6.[IU] | INTRAVENOUS | Status: DC
  Administered 2016-02-27 – 2016-02-28 (×7): 4 10*6.[IU] via INTRAVENOUS
  Filled 2016-02-27 (×14): qty 4

## 2016-02-27 MED ORDER — SODIUM CHLORIDE 0.9% FLUSH
10.0000 mL | Freq: Two times a day (BID) | INTRAVENOUS | Status: DC
  Administered 2016-02-27 – 2016-02-28 (×3): 10 mL

## 2016-02-27 MED ORDER — DILTIAZEM HCL 60 MG PO TABS
90.0000 mg | ORAL_TABLET | Freq: Four times a day (QID) | ORAL | Status: DC
  Administered 2016-02-27 – 2016-02-28 (×5): 90 mg via ORAL
  Filled 2016-02-27 (×5): qty 1

## 2016-02-27 NOTE — Evaluation (Signed)
Physical Therapy Evaluation Patient Details Name: Jonathan Holden MRN: [id number] DOB: May 13, 1945 Today's Date: 02/27/2016   History of Present Illness  Pt admittedd 02/21/16 with AMS and back pain. Pt dx with sepsis, discitits, a fib with RVR. PMH: CAD, HTN, arthritis, back surgery 25 years ago.  Clinical Impression  Pt admitted with/for the complications above.  Pt presently at a mod assist level for basic mobility and gait.  Pt currently limited functionally due to the problems listed. ( See problems list.)   Pt will benefit from PT to maximize function and safety in order to get ready for next venue listed below.     Follow Up Recommendations CIR    Equipment Recommendations  Rolling walker with 5" wheels;3in1 (PT)    Recommendations for Other Services       Precautions / Restrictions Precautions Precautions: Fall Precaution Comments: instructed in back precautions for comfort Required Braces or Orthoses: Spinal Brace Spinal Brace: Lumbar corset;Applied in sitting position Restrictions Weight Bearing Restrictions: No      Mobility  Bed Mobility Overal bed mobility: Needs Assistance Bed Mobility: Rolling;Sidelying to Sit Rolling: Mod assist Sidelying to sit: Mod assist       General bed mobility comments: instructed in log roll technique  Transfers Overall transfer level: Needs assistance Equipment used: Rolling walker (2 wheeled) Transfers: Sit to/from Stand Sit to Stand: Mod assist;From elevated surface         General transfer comment: assist to rise and steady, increased time, cues for hand placement  Ambulation/Gait Ambulation/Gait assistance: Min assist;Mod assist Ambulation Distance (Feet): 70 Feet Assistive device: Rolling walker (2 wheeled) Gait Pattern/deviations: Step-through pattern Gait velocity: slow Gait velocity interpretation: Below normal speed for age/gender General Gait Details: Weak-kneed gait with heavy use of the RW.  Stairs             Wheelchair Mobility    Modified Rankin (Stroke Patients Only)       Balance Overall balance assessment: Needs assistance   Sitting balance-Leahy Scale: Poor Sitting balance - Comments: min guard assist, needing intermittent assist of UEs   Standing balance support: Bilateral upper extremity supported Standing balance-Leahy Scale: Poor Standing balance comment: reliant on B UE support                             Pertinent Vitals/Pain Pain Assessment: 0-10 Pain Score: 4  Pain Location: back Pain Descriptors / Indicators: Sharp Pain Intervention(s): Premedicated before session;Monitored during session    Home Living2/08/2016  Ken Jonathan Holden, [place] [phone]  (pager) Family/patient expects to be discharged to:: Private residence Living Arrangements: Spouse/significant other (girlfriend) Available Help at Discharge: Available 24 hours/day (girlfriend) Type of Home: House Home Access: Stairs to enter Entrance Stairs-Rails: None Entrance Stairs-Number of Steps: 1+1 Home Layout: Two level;Able to live on main level with bedroom/bathroom Home Equipment: None      Prior Function Level of Independence: Independent               Hand Dominance   Dominant Hand: Right    Extremity/Trunk Assessment   Upper Extremity Assessment Upper Extremity Assessment: Defer to OT evaluation    Lower Extremity Assessment Lower Extremity Assessment: Overall WFL for tasks assessed;Generalized weakness (limitations due to pain)       Communication   Communication: No difficulties  Cognition Arousal/Alertness: Awake/alert Behavior During Therapy: WFL for tasks assessed/performed Overall Cognitive Status: Impaired/Different from baseline Area of Impairment: Orientation;Attention;Memory;Safety/judgement;Problem solving;Following commands  Orientation Level: Disoriented to;Time Current Attention Level: Selective Memory: Decreased short-term  memory Following Commands: Follows one step commands with increased time Safety/Judgement: Decreased awareness of safety;Decreased awareness of deficits   Problem Solving: Slow processing;Difficulty sequencing;Requires verbal cues;Requires tactile cues      General Comments General comments (skin integrity, edema, etc.): pt instructed in back care, advised prec/lifting parameters .    Exercises     Assessment/Plan    PT Assessment Patient needs continued PT services  PT Problem List Decreased strength;Decreased activity tolerance;Decreased mobility;Pain;Decreased knowledge of use of DME;Decreased knowledge of precautions          PT Treatment Interventions Gait training;Functional mobility training;Therapeutic activities;Patient/family education;DME instruction    PT Goals (Current goals can be found in the Care Plan section)  Acute Rehab PT Goals Patient Stated Goal: to return to PLOF PT Goal Formulation: With patient Time For Goal Achievement: 03/12/16 Potential to Achieve Goals: Fair    Frequency Min 3X/week   Barriers to discharge        Co-evaluation PT/OT/SLP Co-Evaluation/Treatment: Yes Reason for Co-Treatment: For patient/therapist safety PT goals addressed during session: Mobility/safety with mobility         End of Session Equipment Utilized During Treatment: Back brace Activity Tolerance: Patient limited by pain Patient left: in chair;with call bell/phone within reach;with chair alarm set Nurse Communication: Mobility status         Time: [id number] PT Time Calculation (min) (ACUTE ONLY): 49 min   Charges:   PT Evaluation $PT Eval Moderate Complexity: 1 Procedure     PT G CodesTessie Fass Paz Holden 02/27/2016, 5:03 PM

## 2016-02-27 NOTE — Consult Note (Signed)
Physical Medicine and Rehabilitation Consult Reason for Consult: Discitis/sepsis Referring Physician: [facility]   HPI: Jonathan Holden is a 71 y.o. right handed male with history of hypertension, CAD with stenting, previous laminectomy at L4-5 and L5-S1. Per chart review patient lives with girlfriend. 2 level home with bedroom on main level. Reported to be independent prior to admission. Presented 02/21/2016 with low back pain and altered mental status. He was initially seen in the ED 02/17/2016 given a prescription of steroids and hydrocodone and sent home. A recent MRI as an outpatient showed herniated L3-4 and previous surgical changes. Patient gradually became more confused and returned back to the ED. Found to have A. fib with aVR. Alcohol and urine drug screens negative. Fever 102. WBC of 11,000. Lactic acid unremarkable. Troponin 0.06. Placed on broad-spectrum antibiotics for suspect sepsis. Neurosurgery has been consulted with workup ongoing with MRI reviewed that did not mention disc space infection however discitis was suspect. Cardiology consulted to address atrial fibrillation and currently on intravenous heparin and transitioned to Eliquis. Echocardiogram with ejection fraction 60% no wall motion abnormalities. There was no evidence of vegetation. Still with ongoing bouts of altered mental status. Occupational therapy evaluation completed with recommendations of physical medicine rehabilitation consult.   Review of Systems  Constitutional: Positive for fever. Negative for chills.  HENT: Negative for tinnitus.   Eyes: Negative for blurred vision and double vision.  Respiratory: Negative for cough and shortness of breath.   Cardiovascular: Positive for leg swelling. Negative for chest pain and palpitations.  Gastrointestinal: Positive for constipation. Negative for nausea and vomiting.  Genitourinary: Positive for urgency. Negative for dysuria and hematuria.  Musculoskeletal:  Positive for back pain and myalgias.  Skin: Negative for rash.  Neurological: Positive for weakness. Negative for seizures and loss of consciousness.  All other systems reviewed and are negative.  Past Medical History:  Diagnosis Date  . Arthritis HANDS AND RIGHT SHOULDER  . Coronary artery disease CARDIOLOGIST- DR Daneen Schick- LAST VISIT 6 MON AGO--  REQUESTED NOTE, EKG, ECHO, STRESS TEST   PT DENIES S & S  . H/O gastric ulcer   . History of MI (myocardial infarction) 1992  . Hyperlipidemia   . Hypertension   . Loose body of right shoulder   . S/P coronary angioplasty    Past Surgical History:  Procedure Laterality Date  . CARDIOVASCULAR STRESS TEST  08-22-2008---  DR Daneen Schick   NO ISCHEMIA.   Marland Kitchen CORONARY ANGIOPLASTY  1992  . ERCP W/ SPHICTEROTOMY  12-15-2002   AND STONE EXTRACTION  . LAPAROSCOPIC CHOLECYSTECTOMY  12-16-2002  . LUMBAR LAMINECTOMY  20 YRS AGO   L5   Family History  Problem Relation Age of Onset  . Other Mother     AGE 11 HEALTHY  . Heart attack Father 79    2 MI  . Emphysema    . Other Brother     HEALTHY   Social History:  reports that he quit smoking about 25 years ago. His smoking use included Cigarettes. He has a 40.00 pack-year smoking history. He has never used smokeless tobacco. He reports that he drinks about 4.2 oz of alcohol per week . He reports that he does not use drugs. Allergies:  Allergies  Allergen Reactions  . Other Other (See Comments)    Pt reports allergic reaction to melons - unknown reaction  . Ciprofloxacin Hives   Medications Prior to Admission  Medication Sig Dispense Refill  . Alirocumab (Alvo)  150 MG/ML SOPN Inject 150 mg into the skin every 14 (fourteen) days. 2 pen 11  . atorvastatin (LIPITOR) 80 MG tablet Take 40 mg by mouth daily.    . Coenzyme Q10 (COQ10 PO) Take 1 capsule by mouth daily.    . cyclobenzaprine (FLEXERIL) 10 MG tablet Take 10 mg by mouth 2 (two) times daily.     Marland Kitchen GLUCOSAMINE-CHONDROITIN PO Take  2 tablets by mouth daily.    Marland Kitchen lisinopril-hydrochlorothiazide (PRINZIDE,ZESTORETIC) 20-12.5 MG tablet Take 1 tablet by mouth daily. Patient is overdue for an appointment. Please call and schedule for further refills 90 tablet 0  . Melatonin 10 MG TABS Take 10-20 mg by mouth at bedtime as needed (sleep).     . methylPREDNISolone (MEDROL DOSEPAK) 4 MG TBPK tablet Take as directed on packet (Patient taking differently: Take 4 mg by mouth See admin instructions. Take as directed on package - tapered course) 21 tablet 0  . Multiple Vitamin (MULTIVITAMIN WITH MINERALS) TABS tablet Take 1 tablet by mouth daily. Centrum    . mupirocin ointment (BACTROBAN) 2 % Apply 1 application topically 2 (two) times daily as needed (wound care/ eczema).    . naloxegol oxalate (MOVANTIK) 25 MG TABS tablet Take 25 mg by mouth 2 (two) times daily.    . naproxen sodium (ALEVE) 220 MG tablet Take 660 mg by mouth daily as needed (pain).    . Omega-3 Fatty Acids (FISH OIL) 1000 MG CAPS Take 1,000 mg by mouth daily.    Marland Kitchen oxyCODONE-acetaminophen (PERCOCET) 10-325 MG tablet Take 1 tablet by mouth every 4 (four) hours as needed for pain.     . prednisoLONE acetate (PRED FORTE) 1 % ophthalmic suspension Place 1 drop into the left eye 3 (three) times daily.    Marland Kitchen HYDROcodone-acetaminophen (NORCO/VICODIN) 5-325 MG tablet Take 1-2 tablets by mouth every 6 (six) hours as needed. (Patient not taking: Reported on 02/21/2016) 10 tablet 0  . metoprolol succinate (TOPROL XL) 25 MG 24 hr tablet Take 1 tablet (25 mg total) by mouth daily. (Patient not taking: Reported on 02/21/2016) 90 tablet 1    Home: [place] expects to be discharged to:: Private residence Living Arrangements: Spouse/significant other (girlfriend) Available Help at Discharge: Available 24 hours/day (girlfriend) Type of Home: House Home Access: Stairs to enter Technical brewer of Steps: 1+1 Entrance Stairs-Rails: None Home Layout: Two level, Able to  live on main level with bedroom/bathroom Bathroom Shower/Tub: Multimedia programmer: Standard Home Equipment: None  Functional History: Prior Function Level of Independence: Independent Functional Status:  Mobility: Bed Mobility Overal bed mobility: Needs Assistance Bed Mobility: Rolling, Sidelying to Sit Rolling: Mod assist Sidelying to sit: Mod assist General bed mobility comments: instructed in log roll technique Transfers Overall transfer level: Needs assistance Equipment used: Rolling walker (2 wheeled) Transfers: Sit to/from Stand Sit to Stand: Mod assist, From elevated surface General transfer comment: assist to rise and steady, increased time, cues for hand placement      ADL: ADL Overall ADL's : Needs assistance/impaired Eating/Feeding: Set up, Sitting Grooming: Set up, Sitting Upper Body Bathing: Moderate assistance, Sitting Lower Body Bathing: Total assistance, Sit to/from stand Upper Body Dressing : Maximal assistance, Sitting Upper Body Dressing Details (indicate cue type and reason): began educating in donning back brace Lower Body Dressing: Total assistance, Sit to/from stand Toilet Transfer: Moderate assistance, RW, Ambulation Toileting- Clothing Manipulation and Hygiene: Total assistance, Sit to/from stand Functional mobility during ADLs: Moderate assistance, Rolling walker, Cueing for sequencing, Cueing  for safety  Cognition: Cognition Overall Cognitive Status: Impaired/Different from baseline Orientation Level: Oriented X4 (answers questions approp, but still appears slightly confuse) Cognition Arousal/Alertness: Awake/alert Behavior During Therapy: WFL for tasks assessed/performed Overall Cognitive Status: Impaired/Different from baseline Area of Impairment: Orientation, Attention, Memory, Safety/judgement, Problem solving, Following commands Orientation Level: Disoriented to, Time Current Attention Level: Selective Memory: Decreased  short-term memory Following Commands: Follows one step commands with increased time Safety/Judgement: Decreased awareness of safety, Decreased awareness of deficits Problem Solving: Slow processing, Difficulty sequencing, Requires verbal cues, Requires tactile cues  Blood pressure (!) 134/91, pulse (!) 114, temperature 97.6 F (36.4 C), temperature source Oral, resp. rate 17, height 6\' 1"  (1.854 m), weight 96.4 kg (212 lb 8.4 oz), SpO2 95 %. Physical Exam  Constitutional: He appears well-developed.  71 year old right-handed male sitting up in chair  HENT:  Head: Normocephalic.  Eyes: EOM are normal.  Neck: Normal range of motion. Neck supple. No thyromegaly present.  Cardiovascular:  Cardiac rate control  Respiratory: Breath sounds normal. No respiratory distress.  GI: Soft. Bowel sounds are normal. He exhibits no distension.  Neurological: He is alert.  He is oriented to person, date of birth in place. He cannot recall for events of his hospital stay. Follows simple commands  Skin: Skin is warm and dry.  Patient confabulates, describes rehabilitation team working with him yesterday, but no therapy notes seen and chart Motor strength is 5/5 bilateral deltoid, bicep, triceps, grip 4-/5. Bilateral hip flexor, knee extensor, ankle dorsiflexor is 3 on the right and 4 on the left Sensation intact to light touch bilateral lower limbs Results for orders placed or performed during the hospital encounter of 02/21/16 (from the past 24 hour(s))  CBC with Differential/Platelet     Status: Abnormal   Collection Time: 02/26/16  4:25 PM  Result Value Ref Range   WBC 11.0 (H) 4.0 - 10.5 K/uL   RBC 3.88 (L) 4.22 - 5.81 MIL/uL   Hemoglobin 12.4 (L) 13.0 - 17.0 g/dL   HCT 35.8 (L) 39.0 - 52.0 %   MCV 92.3 78.0 - 100.0 fL   MCH 32.0 26.0 - 34.0 pg   MCHC 34.6 30.0 - 36.0 g/dL   RDW 14.3 11.5 - 15.5 %   Platelets 256 150 - 400 K/uL   Neutrophils Relative % 87 %   Neutro Abs 9.6 (H) 1.7 - 7.7 K/uL     Lymphocytes Relative 5 %   Lymphs Abs 0.6 (L) 0.7 - 4.0 K/uL   Monocytes Relative 8 %   Monocytes Absolute 0.8 0.1 - 1.0 K/uL   Eosinophils Relative 0 %   Eosinophils Absolute 0.0 0.0 - 0.7 K/uL   Basophils Relative 0 %   Basophils Absolute 0.0 0.0 - 0.1 K/uL  CBC     Status: Abnormal   Collection Time: 02/27/16  3:02 AM  Result Value Ref Range   WBC 9.8 4.0 - 10.5 K/uL   RBC 3.85 (L) 4.22 - 5.81 MIL/uL   Hemoglobin 12.2 (L) 13.0 - 17.0 g/dL   HCT 35.4 (L) 39.0 - 52.0 %   MCV 91.9 78.0 - 100.0 fL   MCH 31.7 26.0 - 34.0 pg   MCHC 34.5 30.0 - 36.0 g/dL   RDW 14.3 11.5 - 15.5 %   Platelets 276 150 - 400 K/uL  Heparin level (unfractionated)     Status: None   Collection Time: 02/27/16  3:02 AM  Result Value Ref Range   Heparin Unfractionated 0.35 0.30 - 0.70 IU/mL  Ct Maxillofacial W Contrast  Addendum Date: 02/26/2016   ADDENDUM REPORT: 02/26/2016 17:06 ADDENDUM: There is mild lucency surrounding the roots of the most posterior remaining right mandibular molar. Otherwise, there is no abnormality of the remaining teeth. I discussed this on 02/26/2016 at 5:03 pm with Dr. Rhina Brackett DAM . Electronically Signed   By: Ulyses Jarred M.D.   On: 02/26/2016 17:06   Result Date: 02/26/2016 CLINICAL DATA:  Bacteremia.  Investigation for source. EXAM: CT MAXILLOFACIAL WITH CONTRAST TECHNIQUE: Multidetector CT imaging of the maxillofacial structures was performed. Multiplanar CT image reconstructions were also generated. A small metallic BB was placed on the right temple in order to reliably differentiate right from left. COMPARISON:  None. FINDINGS: Osseous: No facial fracture. Orbits: There is a left scleral band. Bilateral lens replacements. Otherwise normal. Sinuses --Frontal: There is mild left frontal sinus mucosal thickening inferiorly with suspected occlusion of the left frontal recess. No right frontal sinus mucosal thickening. The right frontal recess is patent. --Ethmoid: Minimal ethmoid  sinus mucosal thickening without fluid levels or osseous changes. --Sphenoid: Minimal sphenoid mucosal thickening without fluid levels or osseous abnormalities. Sphenoethmoidal recesses and sphenoid ostia are patent. --Maxillary: Bilateral minimal maxillary mucosal thickening without fluid levels or osseous abnormalities. There is bilateral narrowing of the infundibular recesses. --Nasal cavity: 4 mm of rightward septal deviation. --Nasopharynx: Clear. --Mastoid air cells: No effusion. Soft tissues: Normal. Limited intracranial: Normal. IMPRESSION: Very mild mucosal thickening of all paranasal sinuses without evidence of acute sinusitis. 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 Electronically Signed: By: Ulyses Jarred M.D. On: 02/25/2016 19:49   Dg Chest Port 1 View  Result Date: 02/27/2016 CLINICAL DATA:  Status post right-sided PICC line insertion. EXAM: PORTABLE CHEST 1 VIEW COMPARISON:  Portable chest x-ray of February 21, 2016 FINDINGS: The right-sided PICC line tip projects at the junction of middle and distal portions of the SVC. There is no postprocedure pneumothorax. The lungs are mildly hypoinflated. The interstitial markings are increased but have improved since the study of 6 days ago. The cardiac silhouette remains enlarged. The pulmonary vascularity is less engorged. The bony thorax exhibits no acute abnormality. IMPRESSION: Reasonable positioning of the PICC line with the tip at the junction of the middle and distal portions of the SVC. No postplacement complication. Electronically Signed   By: David  Martinique M.D.   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 Does the need for close, 24 hr/day medical supervision in concert with the patient's rehab needs make it unreasonable for this patient  to be served in a less intensive setting? Yes 2. Co-Morbidities requiring supervision/potential complications: Low back pain, bacteremia, encephalopathy 3. Due to bladder management, bowel management, safety, skin/wound care, disease management, medication administration, pain management and patient education, does the patient require 24 hr/day rehab nursing? Yes 4. Does the patient require coordinated care of a physician, rehab nurse, PT (1-2 hrs/day, 5 days/week), OT (1-2 hrs/day, 5 days/week) and SLP (.5-1 hrs/day, 5 days/week) to address physical and functional deficits in the context of the above medical diagnosis(es)? Yes Addressing deficits in the following areas: balance, endurance, locomotion, strength, transferring, bowel/bladder control, bathing, dressing, feeding, grooming, toileting, cognition and psychosocial support 5. Can the patient actively participate in an intensive therapy program of at least 3 hrs of therapy per day at least 5 days per week?  Yes 6. The potential for patient to make measurable gains while on inpatient rehab is good 7. Anticipated functional outcomes upon discharge from inpatient rehab are supervision  with PT, supervision with OT, supervision with SLP. 8. Estimated rehab length of stay to reach the above functional goals is: 14-17d 9. Does the patient have adequate social supports and living environment to accommodate these discharge functional goals? Yes 10. Anticipated D/C setting: Home 11. Anticipated post D/C treatments: [facility] therapy 12. Overall Rehab/Functional Prognosis: good  RECOMMENDATIONS: This patient's condition is appropriate for continued rehabilitative care in the following setting: CIR Patient has agreed to participate in recommended program. Yes Note that insurance prior authorization may be required for reimbursement for recommended care.  Comment:   Charlett Blake M.D. [facility] FAAPM&R (Sports Med, Neuromuscular  Med) Diplomate Am Board of Electrodiagnostic Med    Cathlyn Parsons., PA-C 02/27/2016

## 2016-02-27 NOTE — Progress Notes (Signed)
Peripherally Inserted Central Catheter/Midline Placement  The IV Nurse has discussed with the patient and/or persons authorized to consent for the patient, the purpose of this procedure and the potential benefits and risks involved with this procedure.  The benefits include less needle sticks, lab draws from the catheter, and the patient may be discharged home with the catheter. Risks include, but not limited to, infection, bleeding, blood clot (thrombus formation), and puncture of an artery; nerve damage and irregular heartbeat and possibility to perform a PICC exchange if needed/ordered by physician.  Alternatives to this procedure were also discussed.  Bard Power PICC patient education guide, fact sheet on infection prevention and patient information card has been provided to patient /or left at bedside.    PICC/Midline Placement Documentation        Jonathan Holden 02/27/2016, 10:56 AM

## 2016-02-27 NOTE — Progress Notes (Signed)
PROGRESS NOTE    Jonathan Holden  [id number] DOB: 06-Jan-1946 DOA: 02/21/2016 PCP: Sinclair Grooms, MD   Brief Narrative:  71 y.o.WM PMHx CAD s/p angioplasty, HTN, HLD, and arthritis   Who presented to the ED with altered mental status. 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 He visited a physician and was given a prescription for steroids and hydrocodone, which he said made him feel "weird". His back pain did not improve and he sought medical attention again. On his return visit the patient was given Flexeril and Percocet in addition to his steroids.  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  The pt was instructed to present to the ED.      In the ED the patient was found to be in SVT and was given adenosine which revealed A. fib with RVR. He was started on a diltiazem drip after which he spontaneously converted to normal sinus rhythm.   Subjective: 2/8 sleepy but arousable. A/O 4.     Assessment & Plan:   Principal Problem:   Sepsis ([place]) Active Problems:   Hyperlipidemia   CAD (coronary artery disease), native coronary artery   Essential hypertension   Hyponatremia   Atrial fibrillation with RVR (HCC)   Back pain   Acute encephalopathy   Bacteremia   Lumbar discitis   Endocarditis of native valve   Infection of intervertebral disc (HCC)   Sepsis unspecified organism / positive strep intermedius bacteremia (2 of 2 blood cx)  -Blood cultures positive for strep - urine culture not revealing  - CXR w/o evidence of infiltrate  - flu/resp virus negative  - Per Dr. Latanya Maudlin orthopedic surgery note from 2/6 feel that patient has a disc space infection not a paraspinal abscess. -no exam findings to suggest cord compression today -Per Dr.Kyle Cabbell neurosurgery no need for surgical  intervention. -Continue antibiotics per ID: 6 weeks IV antibiotics. -TEE per ID on 2/9 -  Infection intravertebral disc vs Abcess?/ Ruptured disk in L spine -See  Dr. Latanya Maudlin orthopedic surgery note from 2/6 -2/6 again explained to patient that he had an abscess vs infected disc in his L-spine, he again stated he did not want to see any other physician then doctor [place]. Counseled patient that doctor Cyndy Freeze was on vacation patient stated he understood -See sepsis  Newly diagnosed Afib w/ RVR(CHA2DS2-VASc is at least 2)  -TSH is normal - HR well controlled at this time  - Heparin gtt  -Cardiology will continue patient on heparin drip until surgical decision finalized. -Spoke with Dr. Acie Fredrickson Cardiology and he will adjust patient's medication to PO over the next couple of days.   - Cardizem 90 mg QID -Increase Metoprolol 125 mg BID  Diarrhea Multiple loose stools noted over night 2/4 - Cdiff testing negative - likely abx associated - trial of imodium - flexiseal until more mobile   Hypokalemia -Due to GI losses - cont to replace to goal of 4.0 - Mg is normal   CAD Remote inferior MI - asymptomatic at this time   HTN BP stable at present   Pulmonary hypertension -Echocardiogram: See results below -TEE pending for 2/9   HLD Cont lipitor  GERD -   DVT prophylaxis:IV heparin   Code Status:  FULL CODE Family Communication:  Disposition  Plan:    Consultants:  Dr.Kyle Cabbell/Dr. Gladstone Lighter  neurosurgery [name]     Procedures/Significant Events:  2/6 Echocardiogram:- LVEF = 55% to 60%.   - Pulmonary arteries:  PA peak pressure: 47 mm Hg (S).   VENTILATOR SETTINGS:    Cultures 2/2 blood positive strep intermedius 2/2 urine insignificant growth 2/2 MRSA by PCR negative 2/3 stool culture pending 2/3 C. difficile negative 2/5 blood 2 pending    Antimicrobials: Anti-infectives    Start     Stop   02/22/16 2130  vancomycin  (VANCOCIN) 1,250 mg in sodium chloride 0.9 % 250 mL IVPB  Status:  Discontinued     02/22/16 1626   02/22/16 1800  cefTRIAXone (ROCEPHIN) 2 g in dextrose 5 % 50 mL IVPB         02/21/16 2200  oseltamivir (TAMIFLU) capsule 75 mg  Status:  Discontinued     02/22/16 1407   02/21/16 1800  vancomycin (VANCOCIN) IVPB 1000 mg/200 mL premix  Status:  Discontinued     02/22/16 1229   02/21/16 1800  piperacillin-tazobactam (ZOSYN) IVPB 3.375 g  Status:  Discontinued     02/22/16 1626   02/21/16 1015  piperacillin-tazobactam (ZOSYN) IVPB 3.375 g     02/21/16 1100   02/21/16 1015  vancomycin (VANCOCIN) IVPB 1000 mg/200 mL premix     02/21/16 1132   02/21/16 0730  oseltamivir (TAMIFLU) capsule 75 mg     02/21/16 0802       Devices    LINES / TUBES:      Continuous Infusions: . heparin 2,400 Units/hr (02/26/16 2322)     Objective: Vitals:   02/26/16 2023 02/27/16 0008 02/27/16 0425 02/27/16 0726  BP: (!) 156/102 (!) 143/102 (!) 149/101 (!) 147/97  Pulse: (!) 139 97 (!) 118 (!) 116  Resp: (!) 25 (!) 21 (!) 24 (!) 23  Temp: 98.3 F (36.8 C) 98 F (36.7 C) 97.7 F (36.5 C) 98.1 F (36.7 C)  TempSrc: Oral Oral Oral Oral  SpO2: 99% 96% 98% 99%  Weight:   96.4 kg (212 lb 8.4 oz)   Height:        Intake/Output Summary (Last 24 hours) at 02/27/16 0816 Last data filed at 02/27/16 0400  Gross per 24 hour  Intake          2421.83 ml  Output             2975 ml  Net          -553.17 ml   Filed Weights   02/25/16 0323 02/26/16 0454 02/27/16 0425  Weight: 99.7 kg (219 lb 12.8 oz) 100.1 kg (220 lb 10.9 oz) 96.4 kg (212 lb 8.4 oz)    Examination:  General: A/O 4, NAD, No acute respiratory distress Eyes: negative scleral hemorrhage, negative anisocoria, negative icterus ENT: Negative Runny nose, negative gingival bleeding, Neck:  Negative scars, masses, torticollis, lymphadenopathy, JVD Lungs: Clear to auscultation bilaterally without wheezes or crackles Cardiovascular: Regular  rate and rhythm without murmur gallop or rub normal S1 and S2 Abdomen: negative abdominal pain, nondistended, positive soft, bowel sounds, no rebound, no ascites, no appreciable mass Extremities: No significant cyanosis, clubbing, or edema bilateral lower extremities Skin: Negative rashes, lesions, ulcers Psychiatric:  Negative depression, negative anxiety, negative fatigue, negative mania  Central nervous system:  Cranial nerves II through XII intact, tongue/uvula midline, all extremities muscle strength 5/5, sensation intact throughout, negative dysarthria, negative expressive aphasia, negative receptive aphasia. Patient unable to come to  seated position secondary to extreme pain in his lower back.  .     Data Reviewed: Care during the described time interval was provided by me .  I have reviewed this patient's available data, including medical history, events of note, physical examination, and all test results as part of my evaluation. I have personally reviewed and interpreted all radiology studies.  CBC:  Recent Labs Lab 02/21/16 0612  02/21/16 2118 02/22/16 1054  02/24/16 0139 02/25/16 0401 02/26/16 0235 02/26/16 1625 02/27/16 0302  WBC 11.0*  --  9.7 10.5  < > 13.3* 12.6* 11.4* 11.0* 9.8  NEUTROABS 8.6*  --  7.5 8.4*  --   --   --   --  9.6*  --   HGB 13.7  < > 12.2* 11.5*  < > 12.3* 12.1* 11.6* 12.4* 12.2*  HCT 38.9*  < > 35.2* 33.2*  < > 36.1* 35.2* 34.6* 35.8* 35.4*  MCV 91.5  --  92.6 92.2  < > 92.1 93.1 92.5 92.3 91.9  PLT 136*  --  125* 130*  < > 183 207 239 256 276  < > = values in this interval not displayed. Basic Metabolic Panel:  Recent Labs Lab 02/21/16 1836  02/22/16 0120 02/22/16 0723 02/23/16 0326 02/24/16 0139 02/25/16 0401  NA  --   < > 136 137 134* 133* 134*  K  --   < > 3.8 4.8 3.0* 3.3* 4.2  CL  --   < > 106 107 103 105 104  CO2  --   < > 21* 21* 20* 20* 19*  GLUCOSE  --   < > 106* 97 124* 129* 134*  BUN  --   < > 18 19 17 20 18   CREATININE   --   < > 0.83 1.08 0.87 0.82 0.85  CALCIUM  --   < > 8.2* 7.9* 8.0* 7.6* 7.8*  MG 1.8  --   --   --   --  2.4  --   PHOS 3.0  --   --   --   --   --   --   < > = values in this interval not displayed. GFR: Estimated Creatinine Clearance: 97.5 mL/min (by C-G formula based on SCr of 0.85 mg/dL). Liver Function Tests:  Recent Labs Lab 02/21/16 0612 02/23/16 0326 02/24/16 0139 02/25/16 0401  AST 30 35 60* 59*  ALT 26 28 49 70*  ALKPHOS 58 50 57 53  BILITOT 1.0 1.0 0.6 0.7  PROT 6.4* 5.5* 5.4* 5.3*  ALBUMIN 3.0* 2.2* 2.0* 2.1*    Recent Labs Lab 02/21/16 1836  LIPASE 16   No results for input(s): AMMONIA in the last 168 hours. Coagulation Profile:  Recent Labs Lab 02/21/16 0647  INR 1.26   Cardiac Enzymes:  Recent Labs Lab 02/21/16 1836 02/21/16 2118 02/21/16 2332 02/22/16 0723  TROPONINI 0.06* 0.03* 0.08* <0.03   BNP (last 3 results) No results for input(s): PROBNP in the last 8760 hours. HbA1C: No results for input(s): HGBA1C in the last 72 hours. CBG: No results for input(s): GLUCAP in the last 168 hours. Lipid Profile: No results for input(s): CHOL, HDL, LDLCALC, TRIG, CHOLHDL, LDLDIRECT in the last 72 hours. Thyroid Function Tests: No results for input(s): TSH, T4TOTAL, FREET4, T3FREE, THYROIDAB in the last 72 hours. Anemia Panel: No results for input(s): VITAMINB12, FOLATE, FERRITIN, TIBC, IRON, RETICCTPCT in the last 72 hours. Urine analysis:    Component Value Date/Time   COLORURINE YELLOW 02/21/2016  [id number]   APPEARANCEUR HAZY (A) 02/21/2016 0626   LABSPEC 1.020 02/21/2016 0626   PHURINE 5.0 02/21/2016 0626   GLUCOSEU NEGATIVE 02/21/2016 0626   HGBUR MODERATE (A) 02/21/2016 0626   BILIRUBINUR NEGATIVE 02/21/2016 0626   KETONESUR NEGATIVE 02/21/2016 0626   PROTEINUR 30 (A) 02/21/2016 0626   NITRITE NEGATIVE 02/21/2016 0626   LEUKOCYTESUR NEGATIVE 02/21/2016 0626   Sepsis Labs: @LABRCNTIP (procalcitonin:4,lacticidven:4)  ) Recent Results  (from the past 240 hour(s))  Blood Culture (routine x 2)     Status: Abnormal (Preliminary result)   Collection Time: 02/21/16  6:09 AM  Result Value Ref Range Status   Specimen Description BLOOD RIGHT ARM  Final   Special Requests BOTTLES DRAWN AEROBIC AND ANAEROBIC 5ML  Final   Culture  Setup Time   Final    GRAM POSITIVE COCCI IN CHAINS IN BOTH AEROBIC AND ANAEROBIC BOTTLES CRITICAL RESULT CALLED TO, READ BACK BY AND VERIFIED WITH: E MARTIN,PHARMD AT 1600 02/22/16 BY J FUDESCO    Culture (A)  Final    STREPTOCOCCUS INTERMEDIUS SUSCEPTIBILITIES PERFORMED ON PREVIOUS CULTURE WITHIN THE LAST 5 DAYS.    Report Status PENDING  Incomplete  Blood Culture ID Panel (Reflexed)     Status: Abnormal   Collection Time: 02/21/16  6:09 AM  Result Value Ref Range Status   Enterococcus species NOT DETECTED NOT DETECTED Final   Listeria monocytogenes NOT DETECTED NOT DETECTED Final   Staphylococcus species NOT DETECTED NOT DETECTED Final   Staphylococcus aureus NOT DETECTED NOT DETECTED Final   Streptococcus species DETECTED (A) NOT DETECTED Final    Comment: Not Enterococcus species, Streptococcus agalactiae, Streptococcus pyogenes, or Streptococcus pneumoniae. CRITICAL RESULT CALLED TO, READ BACK BY AND VERIFIED WITH: E. MARTIN, 02/22/16 AT 1600 BY J FUDESCO    Streptococcus agalactiae NOT DETECTED NOT DETECTED Final   Streptococcus pneumoniae NOT DETECTED NOT DETECTED Final   Streptococcus pyogenes NOT DETECTED NOT DETECTED Final   Acinetobacter baumannii NOT DETECTED NOT DETECTED Final   Enterobacteriaceae species NOT DETECTED NOT DETECTED Final   Enterobacter cloacae complex NOT DETECTED NOT DETECTED Final   Escherichia coli NOT DETECTED NOT DETECTED Final   Klebsiella oxytoca NOT DETECTED NOT DETECTED Final   Klebsiella pneumoniae NOT DETECTED NOT DETECTED Final   Proteus species NOT DETECTED NOT DETECTED Final   Serratia marcescens NOT DETECTED NOT DETECTED Final   Haemophilus influenzae  NOT DETECTED NOT DETECTED Final   Neisseria meningitidis NOT DETECTED NOT DETECTED Final   Pseudomonas aeruginosa NOT DETECTED NOT DETECTED Final   Candida albicans NOT DETECTED NOT DETECTED Final   Candida glabrata NOT DETECTED NOT DETECTED Final   Candida krusei NOT DETECTED NOT DETECTED Final   Candida parapsilosis NOT DETECTED NOT DETECTED Final   Candida tropicalis NOT DETECTED NOT DETECTED Final  Blood Culture (routine x 2)     Status: Abnormal   Collection Time: 02/21/16  6:17 AM  Result Value Ref Range Status   Specimen Description BLOOD LEFT ARM  Final   Special Requests BOTTLES DRAWN AEROBIC AND ANAEROBIC 5ML  Final   Culture  Setup Time   Final    GRAM POSITIVE COCCI IN CHAINS ANAEROBIC BOTTLE ONLY CRITICAL VALUE NOTED.  VALUE IS CONSISTENT WITH PREVIOUSLY REPORTED AND CALLED VALUE.    Culture STREPTOCOCCUS INTERMEDIUS (A)  Final   Report Status 02/25/2016 FINAL  Final   Organism ID, Bacteria STREPTOCOCCUS INTERMEDIUS  Final      Susceptibility   Streptococcus intermedius - MIC*    PENICILLIN <=0.06 SENSITIVE  Sensitive     CEFTRIAXONE 0.25 SENSITIVE Sensitive     ERYTHROMYCIN <=0.12 SENSITIVE Sensitive     LEVOFLOXACIN 0.5 SENSITIVE Sensitive     VANCOMYCIN 0.5 SENSITIVE Sensitive     * STREPTOCOCCUS INTERMEDIUS  Urine culture     Status: Abnormal   Collection Time: 02/21/16  6:26 AM  Result Value Ref Range Status   Specimen Description URINE, RANDOM  Final   Special Requests NONE  Final   Culture <10,000 COLONIES/mL INSIGNIFICANT GROWTH (A)  Final   Report Status 02/22/2016 FINAL  Final  Respiratory Panel by PCR     Status: None   Collection Time: 02/21/16  1:20 PM  Result Value Ref Range Status   Adenovirus NOT DETECTED NOT DETECTED Final   Coronavirus 229E NOT DETECTED NOT DETECTED Final   Coronavirus HKU1 NOT DETECTED NOT DETECTED Final   Coronavirus NL63 NOT DETECTED NOT DETECTED Final   Coronavirus OC43 NOT DETECTED NOT DETECTED Final   Metapneumovirus NOT  DETECTED NOT DETECTED Final   Rhinovirus / Enterovirus NOT DETECTED NOT DETECTED Final   Influenza A NOT DETECTED NOT DETECTED Final   Influenza B NOT DETECTED NOT DETECTED Final   Parainfluenza Virus 1 NOT DETECTED NOT DETECTED Final   Parainfluenza Virus 2 NOT DETECTED NOT DETECTED Final   Parainfluenza Virus 3 NOT DETECTED NOT DETECTED Final   Parainfluenza Virus 4 NOT DETECTED NOT DETECTED Final   Respiratory Syncytial Virus NOT DETECTED NOT DETECTED Final   Bordetella pertussis NOT DETECTED NOT DETECTED Final   Chlamydophila pneumoniae NOT DETECTED NOT DETECTED Final   Mycoplasma pneumoniae NOT DETECTED NOT DETECTED Final  MRSA PCR Screening     Status: None   Collection Time: 02/21/16  8:43 PM  Result Value Ref Range Status   MRSA by PCR NEGATIVE NEGATIVE Final    Comment:        The GeneXpert MRSA Assay (FDA approved for NASAL specimens only), is one component of a comprehensive MRSA colonization surveillance program. It is not intended to diagnose MRSA infection nor to guide or monitor treatment for MRSA infections.   Stool culture (children & immunocomp patients)     Status: None   Collection Time: 02/22/16  9:31 PM  Result Value Ref Range Status   Salmonella/Shigella Screen Final report  Corrected    Comment: (NOTE) Performed At: [facility] [address] [place], [place] [id number] Lindon Romp MD [id number] CORRECTED ON 02/07 AT 1636: PREVIOUSLY REPORTED AS Preliminary report    Campylobacter Culture PENDING  Incomplete   E coli, Shiga toxin Assay Negative Negative Final    Comment: (NOTE) Performed At: [facility] [place], [place] [id number] Lindon Romp MD [id number]   C difficile quick scan w PCR reflex     Status: None   Collection Time: 02/22/16  9:31 PM  Result Value Ref Range Status   C Diff antigen NEGATIVE NEGATIVE Final   C Diff toxin NEGATIVE NEGATIVE Final   C Diff interpretation No C. difficile  detected.  Final  STOOL CULTURE REFLEX - [facility]     Status: None   Collection Time: 02/22/16  9:31 PM  Result Value Ref Range Status   Stool Culture result 1 ([facility]) Comment  Final    Comment: (NOTE) No Salmonella or Shigella recovered. Performed At: [facility] [address] [place], [place] [id number] Lindon Romp MD [id number]   Culture, blood (Routine X 2) w Reflex to ID Panel  Status: None (Preliminary result)   Collection Time: 02/24/16  7:39 AM  Result Value Ref Range Status   Specimen Description BLOOD RIGHT HAND  Final   Special Requests BOTTLES DRAWN AEROBIC AND ANAEROBIC 5CC  Final   Culture NO GROWTH 2 DAYS  Final   Report Status PENDING  Incomplete  Culture, blood (routine x 2)     Status: None (Preliminary result)   Collection Time: 02/24/16  7:46 AM  Result Value Ref Range Status   Specimen Description BLOOD LEFT HAND  Final   Special Requests BOTTLES DRAWN AEROBIC AND ANAEROBIC 5CC  Final   Culture NO GROWTH 2 DAYS  Final   Report Status PENDING  Incomplete         Radiology Studies: Ct Maxillofacial W Contrast  Addendum Date: 02/26/2016   ADDENDUM REPORT: 02/26/2016 17:06 ADDENDUM: There is mild lucency surrounding the roots of the most posterior remaining right mandibular molar. Otherwise, there is no abnormality of the remaining teeth. I discussed this on 02/26/2016 at 5:03 pm with Dr. Rhina Brackett DAM . Electronically Signed   By: Ulyses Jarred M.D.   On: 02/26/2016 17:06   Result Date: 02/26/2016 CLINICAL DATA:  Bacteremia.  Investigation for source. EXAM: CT MAXILLOFACIAL WITH CONTRAST TECHNIQUE: Multidetector CT imaging of the maxillofacial structures was performed. Multiplanar CT image reconstructions were also generated. A small metallic BB was placed on the right temple in order to reliably differentiate right from left. COMPARISON:  None. FINDINGS: Osseous: No facial fracture. Orbits: There is a left scleral band. Bilateral lens  replacements. Otherwise normal. Sinuses --Frontal: There is mild left frontal sinus mucosal thickening inferiorly with suspected occlusion of the left frontal recess. No right frontal sinus mucosal thickening. The right frontal recess is patent. --Ethmoid: Minimal ethmoid sinus mucosal thickening without fluid levels or osseous changes. --Sphenoid: Minimal sphenoid mucosal thickening without fluid levels or osseous abnormalities. Sphenoethmoidal recesses and sphenoid ostia are patent. --Maxillary: Bilateral minimal maxillary mucosal thickening without fluid levels or osseous abnormalities. There is bilateral narrowing of the infundibular recesses. --Nasal cavity: 4 mm of rightward septal deviation. --Nasopharynx: Clear. --Mastoid air cells: No effusion. Soft tissues: Normal. Limited intracranial: Normal. IMPRESSION: Very mild mucosal thickening of all paranasal sinuses without evidence of acute sinusitis. 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 Electronically Signed: By: Ulyses Jarred M.D. On: 02/25/2016 19:49        Scheduled Meds: . Alirocumab  150 mg Subcutaneous Q14 Days  . atorvastatin  40 mg Oral Daily  . diltiazem  60 mg Oral Q6H  . famotidine  20 mg Oral BID  . folic acid  1 mg Oral Daily  . lidocaine  2 patch Transdermal Q24H  . lisinopril  20 mg Oral Daily  . mouth rinse  15 mL Mouth Rinse BID  . metoprolol tartrate  100 mg Oral BID  . multivitamin with minerals  1 tablet Oral Daily  . pantoprazole  40 mg Oral Q1200  . pencillin G potassium IV  4 Million Units Intravenous Q4H  . pneumococcal 23 valent vaccine  0.5 mL Intramuscular Tomorrow-1000  . prednisoLONE acetate  1 drop Left Eye TID  . thiamine  100 mg Oral Daily   Continuous Infusions: . heparin 2,400 Units/hr (02/26/16 2322)     LOS: 6 days    Time spent: 40 minutes    WOODS, Geraldo Docker, MD [facility] Pager [phone]   If 7PM-7AM, please contact  night-coverage [email or web address] Password [email or web address] 02/27/2016, 8:16 AM

## 2016-02-27 NOTE — Progress Notes (Signed)
Patient Name: Jonathan Holden Date of Encounter: 02/27/2016  Primary Cardiologist: Dr. 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 No CP or SOB. Does not feel elevated HR   Inpatient Medications    Scheduled Meds: . Alirocumab  150 mg Subcutaneous Q14 Days  . atorvastatin  40 mg Oral Daily  . diltiazem  60 mg Oral Q6H  . famotidine  20 mg Oral BID  . folic acid  1 mg Oral Daily  . lidocaine  2 patch Transdermal Q24H  . lisinopril  20 mg Oral Daily  . mouth rinse  15 mL Mouth Rinse BID  . metoprolol tartrate  100 mg Oral BID  . multivitamin with minerals  1 tablet Oral Daily  . pantoprazole  40 mg Oral Q1200  . pencillin G potassium IV  4 Million Units Intravenous Q4H  . pneumococcal 23 valent vaccine  0.5 mL Intramuscular Tomorrow-1000  . prednisoLONE acetate  1 drop Left Eye TID  . thiamine  100 mg Oral Daily   Continuous Infusions: . heparin 2,400 Units/hr (02/26/16 2322)   PRN Meds: acetaminophen **OR** acetaminophen, alum & mag hydroxide-simeth, gi cocktail, haloperidol lactate, hydrALAZINE, loperamide, Melatonin, metoprolol, ondansetron **OR** ondansetron (ZOFRAN) IV, oxyCODONE-acetaminophen, polyethylene glycol   Vital Signs    Vitals:   02/26/16 2023 02/27/16 0008 02/27/16 0425 02/27/16 0726  BP: (!) 156/102 (!) 143/102 (!) 149/101 (!) 147/97  Pulse: (!) 139 97 (!) 118 (!) 116  Resp: (!) 25 (!) 21 (!) 24 (!) 23  Temp: 98.3 F (36.8 C) 98 F (36.7 C) 97.7 F (36.5 C) 98.1 F (36.7 C)  TempSrc: Oral Oral Oral Oral  SpO2: 99% 96% 98% 99%  Weight:   212 lb 8.4 oz (96.4 kg)     Height:        Intake/Output Summary (Last 24 hours) at 02/27/16 0745 Last data filed at 02/27/16 0400  Gross per 24 hour  Intake          2421.83 ml  Output             3275 ml  Net          -853.17 ml   Filed Weights   02/25/16 0323 02/26/16 0454 02/27/16 0425  Weight: 219 lb 12.8 oz (99.7 kg) 220 lb 10.9 oz (100.1 kg) 212 lb 8.4 oz (96.4 kg)    Physical Exam   GEN: Well nourished, well developed, in no acute distress.  HEENT: Grossly normal.  Neck: Supple, no JVD, carotid bruits, or masses. Cardiac: irreg irreg, tachycardic, no murmurs, rubs, or gallops. No clubbing, cyanosis, edema.  Radials/DP/PT 2+ and equal bilaterally.  Respiratory:  Respirations regular and unlabored, clear to auscultation bilaterally. GI: Soft, nontender, nondistended, BS + x 4. MS: no deformity or atrophy. Skin: warm and dry, no rash. Neuro:  Strength and sensation are intact. Psych: AAOx3.  Normal affect.  Labs    CBC  Recent Labs  02/26/16 1625 02/27/16 0302  WBC 11.0* 9.8  NEUTROABS 9.6*  --   HGB 12.4* 12.2*  HCT 35.8*  35.4*  MCV 92.3 91.9  PLT 256 [id number]   Basic Metabolic Panel  Recent Labs  02/25/16 0401  NA 134*  K 4.2  CL 104  CO2 19*  GLUCOSE 134*  BUN 18  CREATININE 0.85  CALCIUM 7.8*   Liver Function Tests  Recent Labs  02/25/16 0401  AST 59*  ALT 70*  ALKPHOS 53  BILITOT 0.7  PROT 5.3*  ALBUMIN 2.1*     Telemetry    Afib with RVR with freq PVCs and bigeminy HR 100-120 - Personally Reviewed  ECG    Afib with RVR with freq PVCs  - Personally Reviewed  Radiology    Ct Maxillofacial W Contrast  Addendum Date: 02/26/2016   ADDENDUM REPORT: 02/26/2016 17:06 ADDENDUM: There is mild lucency surrounding the roots of the most posterior remaining right mandibular molar. Otherwise, there is no abnormality of the remaining teeth. I discussed this on 02/26/2016 at 5:03 pm with Dr. Rhina Brackett DAM . Electronically Signed   By: Ulyses Jarred M.D.   On: 02/26/2016  17:06   Result Date: 02/26/2016 CLINICAL DATA:  Bacteremia.  Investigation for source. EXAM: CT MAXILLOFACIAL WITH CONTRAST TECHNIQUE: Multidetector CT imaging of the maxillofacial structures was performed. Multiplanar CT image reconstructions were also generated. A small metallic BB was placed on the right temple in order to reliably differentiate right from left. COMPARISON:  None. FINDINGS: Osseous: No facial fracture. Orbits: There is a left scleral band. Bilateral lens replacements. Otherwise normal. Sinuses --Frontal: There is mild left frontal sinus mucosal thickening inferiorly with suspected occlusion of the left frontal recess. No right frontal sinus mucosal thickening. The right frontal recess is patent. --Ethmoid: Minimal ethmoid sinus mucosal thickening without fluid levels or osseous changes. --Sphenoid: Minimal sphenoid mucosal thickening without fluid levels or osseous abnormalities. Sphenoethmoidal recesses and sphenoid ostia are patent. --Maxillary: Bilateral minimal maxillary mucosal thickening without fluid levels or osseous abnormalities. There is bilateral narrowing of the infundibular recesses. --Nasal cavity: 4 mm of rightward septal deviation. --Nasopharynx: Clear. --Mastoid air cells: No effusion. Soft tissues: Normal. Limited intracranial: Normal. IMPRESSION: Very mild mucosal thickening of all paranasal sinuses without evidence of acute sinusitis. 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 Electronically Signed: By: Ulyses Jarred M.D. On: 02/25/2016 19:49    Cardiac Studies   2D ECHO: 02/25/2016 LV EF: 55%-60% Study Conclusions - Left ventricle: The cavity size was normal. There was mild focal   basal hypertrophy of the septum. Systolic function was normal.   The estimated ejection fraction was in the range of 55% to 60%.   Wall motion was normal; there were no regional wall motion   abnormalities. The study is not technically sufficient to  allow   evaluation of LV diastolic function. - Aortic valve: Trileaflet; moderately thickened, mildly calcified   leaflets. - Tricuspid valve: There was trivial regurgitation. - Pulmonary arteries: Systolic pressure was moderately increased.   PA peak pressure: 47 mm Hg (S). Impressions: - There was no evidence of a vegetation.   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 He was found to have sepsis with streptococcal bacteremia. In the ED the patient was found to be in SVT and was given adenosine which revealed A. fib with RVR and cardiology consulted.    Assessment & Plan  Afib with RVR (new diagnosis of afib): -- Lopressor increased from 50mg  to 100 mg BID yesterday and Diltiazem 60 Q 6 also added.  Overall, HRs improved from yesterday but still 100-120s. Will increase dilt to 90mg  q6. -- 2D ECHO with normal EF and mod increased PA pressure (47) -- CHADSVASC of at least 3 (HTN, age, vasc dz). We can start on Eliquis 5mg  BID as soon as PICC line placed and we know there is no need for a biopsy. Continue IV heparin for now  Staph bacteremia: ID requesting TEE. Arranged for tomorrow at 12 noon. NPO after midnight. orders placed.  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 Christella Noa  HTN: BP improved from yesterday but still mildly elevated.   CAD: hx of remote MI. Troponin mildly elevated with flat trend c/w demand ischemia in the setting of afib with RVR and sepsis  Signed, Angelena Form, PA-C  02/27/2016, 7:45 AM    Attending Note:   The patient was seen and examined.  Agree with assessment and plan as noted above.  Changes made to the above note as needed.  Patient seen and independently examined with Nell Range, [place] .   We discussed all aspects of the encounter. I agree with the assessment and plan  as stated above.  1. Atrial fib:   Agree with gradual titration upward of his metoprolol for better rate control.  Start Eliquis 5 BID  DC heparin   2. Discitis:   For TEE tomorrow to look for possible endocarditis     I have spent a total of 40 minutes with patient reviewing hospital  notes , telemetry, EKGs, labs and examining patient as well as establishing an assessment and plan that was discussed with the patient. > 50% of time was spent in direct patient care.    Thayer Headings, Brooke Bonito., MD, [facility] 02/27/2016, 12:19 PM [address],  [place] Pager [phone]

## 2016-02-27 NOTE — Progress Notes (Signed)
Subjective:  No new complaints.    Antibiotics:  Anti-infectives    Start     Dose/Rate Route Frequency Ordered Stop   02/27/16 0900  penicillin G potassium 4 Million Units in dextrose 5 % 250 mL IVPB  Status:  Discontinued     4 Million Units 250 mL/hr over 60 Minutes Intravenous Every 4 hours 02/27/16 0844 02/27/16 0847   02/27/16 0900  penicillin G potassium 4 Million Units in dextrose 5 % 250 mL IVPB     4 Million Units 250 mL/hr over 60 Minutes Intravenous Every 4 hours 02/27/16 0847     02/26/16 1800  penicillin G potassium 4 Million Units in dextrose 5 % 250 mL IVPB  Status:  Discontinued     4 Million Units 250 mL/hr over 60 Minutes Intravenous Every 4 hours 02/26/16 1702 02/27/16 0844   02/22/16 2130  vancomycin (VANCOCIN) 1,250 mg in sodium chloride 0.9 % 250 mL IVPB  Status:  Discontinued     1,250 mg 166.7 mL/hr over 90 Minutes Intravenous Every 12 hours 02/22/16 1229 02/22/16 1626   02/22/16 1800  cefTRIAXone (ROCEPHIN) 2 g in dextrose 5 % 50 mL IVPB  Status:  Discontinued     2 g 100 mL/hr over 30 Minutes Intravenous Every 24 hours 02/22/16 1626 02/26/16 1702   02/21/16 2200  oseltamivir (TAMIFLU) capsule 75 mg  Status:  Discontinued     75 mg Oral 2 times daily 02/21/16 1516 02/22/16 1407   02/21/16 1800  vancomycin (VANCOCIN) IVPB 1000 mg/200 mL premix  Status:  Discontinued     1,000 mg 200 mL/hr over 60 Minutes Intravenous Every 8 hours 02/21/16 1138 02/22/16 1229   02/21/16 1800  piperacillin-tazobactam (ZOSYN) IVPB 3.375 g  Status:  Discontinued     3.375 g 12.5 mL/hr over 240 Minutes Intravenous Every 8 hours 02/21/16 1138 02/22/16 1626   02/21/16 1015  piperacillin-tazobactam (ZOSYN) IVPB 3.375 g     3.375 g 100 mL/hr over 30 Minutes Intravenous  Once 02/21/16 1002 02/21/16 1100   02/21/16 1015  vancomycin (VANCOCIN) IVPB 1000 mg/200 mL premix     1,000 mg 200 mL/hr over 60 Minutes Intravenous  Once 02/21/16 1002 02/21/16 1132   02/21/16 0730   oseltamivir (TAMIFLU) capsule 75 mg     75 mg Oral  Once 02/21/16 5170 02/21/16 0802      Medications: Scheduled Meds: . Alirocumab  150 mg Subcutaneous Q14 Days  . apixaban  5 mg Oral BID  . atorvastatin  40 mg Oral Daily  . diltiazem  90 mg Oral Q6H  . famotidine  20 mg Oral BID  . folic acid  1 mg Oral Daily  . lidocaine  2 patch Transdermal Q24H  . lisinopril  20 mg Oral Daily  . mouth rinse  15 mL Mouth Rinse BID  . metoprolol tartrate  125 mg Oral BID  . multivitamin with minerals  1 tablet Oral Daily  . pantoprazole  40 mg Oral Q1200  . pencillin G potassium IV  4 Million Units Intravenous Q4H  . pneumococcal 23 valent vaccine  0.5 mL Intramuscular Tomorrow-1000  . prednisoLONE acetate  1 drop Left Eye TID  . sodium chloride flush  10-40 mL Intracatheter Q12H  . thiamine  100 mg Oral Daily   Continuous Infusions:  PRN Meds:.acetaminophen **OR** acetaminophen, alum & mag hydroxide-simeth, gi cocktail, haloperidol lactate, hydrALAZINE, loperamide, Melatonin, metoprolol, ondansetron **OR** ondansetron (ZOFRAN) IV, oxyCODONE-acetaminophen, polyethylene glycol, sodium  chloride flush    Objective: Weight change: -8 lb 2.5 oz (-3.7 kg)  Intake/Output Summary (Last 24 hours) at 02/27/16 1624 Last data filed at 02/27/16 1302  Gross per 24 hour  Intake           2362.4 ml  Output             1475 ml  Net            887.4 ml   Blood pressure 105/68, pulse 93, temperature 98.2 F (36.8 C), temperature source Oral, resp. rate 17, height 6' 1"  (1.854 m), weight 212 lb 8.4 oz (96.4 kg), SpO2 95 %. Temp:  [97.6 F (36.4 C)-98.3 F (36.8 C)] 98.2 F (36.8 C) (02/08 1502) Pulse Rate:  [93-139] 93 (02/08 1502) Resp:  [17-25] 17 (02/08 1502) BP: (105-157)/(68-121) 105/68 (02/08 1502) SpO2:  [95 %-99 %] 95 % (02/08 1502) Weight:  [212 lb 8.4 oz (96.4 kg)] 212 lb 8.4 oz (96.4 kg) (02/08 0425)  Physical Exam: General: Alert and awake, oriented x3, not in any acute  distress. HEENT: anicteric sclera,EOMI, dentition is poor in areas CVS regular rate, normal r,  no murmur rubs or gallops Chest: clear to auscultation bilaterally, no wheezing, rales or rhonchi Abdomen: soft nontender, nondistended, normal bowel sounds, Extremities: no  clubbing or edema noted bilaterally Skin: no rashes,  Neuro: nonfocal  CBC:  CBC Latest Ref Rng & Units 02/27/2016 02/26/2016 02/26/2016  WBC 4.0 - 10.5 K/uL 9.8 11.0(H) 11.4(H)  Hemoglobin 13.0 - 17.0 g/dL 12.2(L) 12.4(L) 11.6(L)  Hematocrit 39.0 - 52.0 % 35.4(L) 35.8(L) 34.6(L)  Platelets 150 - 400 K/uL 276 256 239      BMET  Recent Labs  02/25/16 0401  NA 134*  K 4.2  CL 104  CO2 19*  GLUCOSE 134*  BUN 18  CREATININE 0.85  CALCIUM 7.8*     Liver Panel   Recent Labs  02/25/16 0401  PROT 5.3*  ALBUMIN 2.1*  AST 59*  ALT 70*  ALKPHOS 53  BILITOT 0.7       Sedimentation Rate  Recent Labs  02/26/16 0235  ESRSEDRATE 82*   C-Reactive Protein  Recent Labs  02/26/16 0235  CRP 11.4*    Micro Results: Recent Results (from the past 720 hour(s))  Blood Culture (routine x 2)     Status: Abnormal (Preliminary result)   Collection Time: 02/21/16  6:09 AM  Result Value Ref Range Status   Specimen Description BLOOD RIGHT ARM  Final   Special Requests BOTTLES DRAWN AEROBIC AND ANAEROBIC 5ML  Final   Culture  Setup Time   Final    GRAM POSITIVE COCCI IN CHAINS IN BOTH AEROBIC AND ANAEROBIC BOTTLES CRITICAL RESULT CALLED TO, READ BACK BY AND VERIFIED WITH: E MARTIN,PHARMD AT 1600 02/22/16 BY J FUDESCO    Culture (A)  Final    STREPTOCOCCUS INTERMEDIUS SUSCEPTIBILITIES PERFORMED ON PREVIOUS CULTURE WITHIN THE LAST 5 DAYS.    Report Status PENDING  Incomplete  Blood Culture ID Panel (Reflexed)     Status: Abnormal   Collection Time: 02/21/16  6:09 AM  Result Value Ref Range Status   Enterococcus species NOT DETECTED NOT DETECTED Final   Listeria monocytogenes NOT DETECTED NOT DETECTED Final    Staphylococcus species NOT DETECTED NOT DETECTED Final   Staphylococcus aureus NOT DETECTED NOT DETECTED Final   Streptococcus species DETECTED (A) NOT DETECTED Final    Comment: Not Enterococcus species, Streptococcus agalactiae, Streptococcus pyogenes, or Streptococcus pneumoniae. CRITICAL RESULT  CALLED TO, READ BACK BY AND VERIFIED WITH: E. MARTIN, 02/22/16 AT 1600 BY J FUDESCO    Streptococcus agalactiae NOT DETECTED NOT DETECTED Final   Streptococcus pneumoniae NOT DETECTED NOT DETECTED Final   Streptococcus pyogenes NOT DETECTED NOT DETECTED Final   Acinetobacter baumannii NOT DETECTED NOT DETECTED Final   Enterobacteriaceae species NOT DETECTED NOT DETECTED Final   Enterobacter cloacae complex NOT DETECTED NOT DETECTED Final   Escherichia coli NOT DETECTED NOT DETECTED Final   Klebsiella oxytoca NOT DETECTED NOT DETECTED Final   Klebsiella pneumoniae NOT DETECTED NOT DETECTED Final   Proteus species NOT DETECTED NOT DETECTED Final   Serratia marcescens NOT DETECTED NOT DETECTED Final   Haemophilus influenzae NOT DETECTED NOT DETECTED Final   Neisseria meningitidis NOT DETECTED NOT DETECTED Final   Pseudomonas aeruginosa NOT DETECTED NOT DETECTED Final   Candida albicans NOT DETECTED NOT DETECTED Final   Candida glabrata NOT DETECTED NOT DETECTED Final   Candida krusei NOT DETECTED NOT DETECTED Final   Candida parapsilosis NOT DETECTED NOT DETECTED Final   Candida tropicalis NOT DETECTED NOT DETECTED Final  Blood Culture (routine x 2)     Status: Abnormal   Collection Time: 02/21/16  6:17 AM  Result Value Ref Range Status   Specimen Description BLOOD LEFT ARM  Final   Special Requests BOTTLES DRAWN AEROBIC AND ANAEROBIC 5ML  Final   Culture  Setup Time   Final    GRAM POSITIVE COCCI IN CHAINS ANAEROBIC BOTTLE ONLY CRITICAL VALUE NOTED.  VALUE IS CONSISTENT WITH PREVIOUSLY REPORTED AND CALLED VALUE.    Culture STREPTOCOCCUS INTERMEDIUS (A)  Final   Report Status 02/25/2016  FINAL  Final   Organism ID, Bacteria STREPTOCOCCUS INTERMEDIUS  Final      Susceptibility   Streptococcus intermedius - MIC*    PENICILLIN <=0.06 SENSITIVE Sensitive     CEFTRIAXONE 0.25 SENSITIVE Sensitive     ERYTHROMYCIN <=0.12 SENSITIVE Sensitive     LEVOFLOXACIN 0.5 SENSITIVE Sensitive     VANCOMYCIN 0.5 SENSITIVE Sensitive     * STREPTOCOCCUS INTERMEDIUS  Urine culture     Status: Abnormal   Collection Time: 02/21/16  6:26 AM  Result Value Ref Range Status   Specimen Description URINE, RANDOM  Final   Special Requests NONE  Final   Culture <10,000 COLONIES/mL INSIGNIFICANT GROWTH (A)  Final   Report Status 02/22/2016 FINAL  Final  Respiratory Panel by PCR     Status: None   Collection Time: 02/21/16  1:20 PM  Result Value Ref Range Status   Adenovirus NOT DETECTED NOT DETECTED Final   Coronavirus 229E NOT DETECTED NOT DETECTED Final   Coronavirus HKU1 NOT DETECTED NOT DETECTED Final   Coronavirus NL63 NOT DETECTED NOT DETECTED Final   Coronavirus OC43 NOT DETECTED NOT DETECTED Final   Metapneumovirus NOT DETECTED NOT DETECTED Final   Rhinovirus / Enterovirus NOT DETECTED NOT DETECTED Final   Influenza A NOT DETECTED NOT DETECTED Final   Influenza B NOT DETECTED NOT DETECTED Final   Parainfluenza Virus 1 NOT DETECTED NOT DETECTED Final   Parainfluenza Virus 2 NOT DETECTED NOT DETECTED Final   Parainfluenza Virus 3 NOT DETECTED NOT DETECTED Final   Parainfluenza Virus 4 NOT DETECTED NOT DETECTED Final   Respiratory Syncytial Virus NOT DETECTED NOT DETECTED Final   Bordetella pertussis NOT DETECTED NOT DETECTED Final   Chlamydophila pneumoniae NOT DETECTED NOT DETECTED Final   Mycoplasma pneumoniae NOT DETECTED NOT DETECTED Final  MRSA PCR Screening  Status: None   Collection Time: 02/21/16  8:43 PM  Result Value Ref Range Status   MRSA by PCR NEGATIVE NEGATIVE Final    Comment:        The GeneXpert MRSA Assay (FDA approved for NASAL specimens only), is one component  of a comprehensive MRSA colonization surveillance program. It is not intended to diagnose MRSA infection nor to guide or monitor treatment for MRSA infections.   Stool culture (children & immunocomp patients)     Status: None   Collection Time: 02/22/16  9:31 PM  Result Value Ref Range Status   Salmonella/Shigella Screen Final report  Corrected    Comment: (NOTE) Performed At: [facility] [address] [place], [place] [id number] Lindon Romp MD WI:[id number] CORRECTED ON 02/07 AT 1636: PREVIOUSLY REPORTED AS Preliminary report    Campylobacter Culture PENDING  Incomplete   E coli, Shiga toxin Assay Negative Negative Final    Comment: (NOTE) Performed At: [facility] [place], [place] [id number] Lindon Romp MD QI:[id number]   C difficile quick scan w PCR reflex     Status: None   Collection Time: 02/22/16  9:31 PM  Result Value Ref Range Status   C Diff antigen NEGATIVE NEGATIVE Final   C Diff toxin NEGATIVE NEGATIVE Final   C Diff interpretation No C. difficile detected.  Final  STOOL CULTURE REFLEX - RSASHR     Status: None   Collection Time: 02/22/16  9:31 PM  Result Value Ref Range Status   Stool Culture result 1 (RSASHR) Comment  Final    Comment: (NOTE) No Salmonella or Shigella recovered. Performed At: [facility] [place], [place] [id number] Lindon Romp MD YJ:[id number]   Culture, blood (Routine X 2) w Reflex to ID Panel     Status: None (Preliminary result)   Collection Time: 02/24/16  7:39 AM  Result Value Ref Range Status   Specimen Description BLOOD RIGHT HAND  Final   Special Requests BOTTLES DRAWN AEROBIC AND ANAEROBIC 5CC  Final   Culture NO GROWTH 3 DAYS  Final   Report Status PENDING  Incomplete  Culture, blood (routine x 2)     Status: None (Preliminary result)   Collection Time: 02/24/16  7:46 AM  Result Value Ref Range Status   Specimen Description BLOOD LEFT HAND  Final    Special Requests BOTTLES DRAWN AEROBIC AND ANAEROBIC 5CC  Final   Culture NO GROWTH 3 DAYS  Final   Report Status PENDING  Incomplete    Studies/Results: Ct Maxillofacial W Contrast  Addendum Date: 02/26/2016   ADDENDUM REPORT: 02/26/2016 17:06 ADDENDUM: There is mild lucency surrounding the roots of the most posterior remaining right mandibular molar. Otherwise, there is no abnormality of the remaining teeth. I discussed this on 02/26/2016 at 5:03 pm with Dr. Rhina Brackett DAM . Electronically Signed   By: Ulyses Jarred M.D.   On: 02/26/2016 17:06   Result Date: 02/26/2016 CLINICAL DATA:  Bacteremia.  Investigation for source. EXAM: CT MAXILLOFACIAL WITH CONTRAST TECHNIQUE: Multidetector CT imaging of the maxillofacial structures was performed. Multiplanar CT image reconstructions were also generated. A small metallic BB was placed on the right temple in order to reliably differentiate right from left. COMPARISON:  None. FINDINGS: Osseous: No facial fracture. Orbits: There is a left scleral band. Bilateral lens replacements. Otherwise normal. Sinuses --Frontal: There is mild left frontal sinus mucosal thickening inferiorly with suspected occlusion of the left frontal recess. No right frontal  sinus mucosal thickening. The right frontal recess is patent. --Ethmoid: Minimal ethmoid sinus mucosal thickening without fluid levels or osseous changes. --Sphenoid: Minimal sphenoid mucosal thickening without fluid levels or osseous abnormalities. Sphenoethmoidal recesses and sphenoid ostia are patent. --Maxillary: Bilateral minimal maxillary mucosal thickening without fluid levels or osseous abnormalities. There is bilateral narrowing of the infundibular recesses. --Nasal cavity: 4 mm of rightward septal deviation. --Nasopharynx: Clear. --Mastoid air cells: No effusion. Soft tissues: Normal. Limited intracranial: Normal. IMPRESSION: Very mild mucosal thickening of all paranasal sinuses without evidence of acute  sinusitis. 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 Electronically Signed: By: Ulyses Jarred M.D. On: 02/25/2016 19:49   Dg Chest Port 1 View  Result Date: 02/27/2016 CLINICAL DATA:  Status post right-sided PICC line insertion. EXAM: PORTABLE CHEST 1 VIEW COMPARISON:  Portable chest x-ray of February 21, 2016 FINDINGS: The right-sided PICC line tip projects at the junction of middle and distal portions of the SVC. There is no postprocedure pneumothorax. The lungs are mildly hypoinflated. The interstitial markings are increased but have improved since the study of 6 days ago. The cardiac silhouette remains enlarged. The pulmonary vascularity is less engorged. The bony thorax exhibits no acute abnormality. IMPRESSION: Reasonable positioning of the PICC line with the tip at the junction of the middle and distal portions of the SVC. No postplacement complication. Electronically Signed   By: David  Martinique M.D.   On: 02/27/2016 12:40      Assessment/Plan:  INTERVAL HISTORY:   MRI report that was scanned in does not mention disk space infection though Dr. 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 male with  Streptococcal bacteremia and diskitis L4-L5, L5-S1   It is NOT clear to me that the patient requires a Neurosurgical intervention but will defer to Dr. Christella Noa when he takes over  We already have the pathogen which is Streptococcus intermedius which is quite PCN sensitive  I suspect the source of this infection is likely  odontogenic.  I have reviewed CT MF with radiology and there is a little bit of LUCENCY around the right hand side of the posterior molar mandible. It is not esp remarkable per Dr. Collins [place] reviews of  Films  Would for him seeing a dentist at some point after he is discharged  TEE to evaluate his valves tomorrow  I am going to treat him for 6 weeks with  Of  high-dose penicillin regardless given concern for diskitis  Diagnosis: Bacteremia, Diskitis +/- endocarditis  Culture Result: Streptococcus intermedius  Allergies  Allergen Reactions  . Other Other (See Comments)    Pt reports allergic reaction to melons - unknown reaction  . Ciprofloxacin Hives    Discharge antibiotics: 24 million units of Penicillin daily via continuous infusion  Duration: 6 weeks  End Date:  04/05/2016  [facility] Care Per Protocol:  Labs weekly while on IV antibiotics: _x_ CBC with differential _x_ BMP _x_ CRP _x_ ESR   x__ Please pull PIC at completion of IV  antibiotics __ Please leave PIC in place until doctor has seen patient or been notified  Fax weekly labs to [phone]  Clinic Follow Up Appt:   Next 4 weeks        LOS: 6 days   Alcide Evener 02/27/2016, 4:24 PM

## 2016-02-27 NOTE — Evaluation (Signed)
Occupational Therapy Evaluation Patient Details Name: Jonathan Holden MRN: [id number] DOB: 03-Jun-1945 Today's Date: 02/27/2016    History of Present Illness Pt admittedd 02/21/16 with AMS and back pain. Pt dx with sepsis, discitits, a fib with RVR. PMH: CAD, HTN, arthritis, back surgery 25 years ago.   Clinical Impression   Pt is independent at baseline. Presents with impaired cognition, back pain and poor balance interfering with ability to perform self care and mobility. Pt educated in back precautions for comfort. He currently requires moderate assistance for mobility and set up to total assist for ADL. Pt will need post acute rehab prior to d/c home with his girlfriend's assistance. Will follow.    Follow Up Recommendations  CIR;Supervision/Assistance - 24 hour    Equipment Recommendations  3 in 1 bedside commode    Recommendations for Other Services       Precautions / Restrictions Precautions Precautions: Fall Precaution Comments: instructed in back precautions for comfort Required Braces or Orthoses: Spinal Brace Spinal Brace: Lumbar corset;Applied in sitting position Restrictions Weight Bearing Restrictions: No      Mobility Bed Mobility Overal bed mobility: Needs Assistance Bed Mobility: Rolling;Sidelying to Sit Rolling: Mod assist Sidelying to sit: Mod assist       General bed mobility comments: instructed in log roll technique  Transfers Overall transfer level: Needs assistance Equipment used: Rolling walker (2 wheeled) Transfers: Sit to/from Stand Sit to Stand: Mod assist;From elevated surface         General transfer comment: assist to rise and steady, increased time, cues for hand placement    Balance Overall balance assessment: Needs assistance   Sitting balance-Leahy Scale: Poor Sitting balance - Comments: min guard assist, needing intermittent assist of UEs     Standing balance-Leahy Scale: Poor Standing balance comment: reliant on B UE  support                            ADL Overall ADL's : Needs assistance/impaired Eating/Feeding: Set up;Sitting   Grooming: Set up;Sitting   Upper Body Bathing: Moderate assistance;Sitting   Lower Body Bathing: Total assistance;Sit to/from stand   Upper Body Dressing : Maximal assistance;Sitting Upper Body Dressing Details (indicate cue type and reason): began educating in donning back brace Lower Body Dressing: Total assistance;Sit to/from stand   Toilet Transfer: Moderate assistance;RW;Ambulation   Toileting- Clothing Manipulation and Hygiene: Total assistance;Sit to/from stand       Functional mobility during ADLs: Moderate assistance;Rolling walker;Cueing for sequencing;Cueing for safety       Vision     Perception     Praxis      Pertinent Vitals/Pain Pain Assessment: 0-10 Pain Score: 4  Pain Location: back Pain Descriptors / Indicators: Sharp Pain Intervention(s): Premedicated before session;Monitored during session;Repositioned     Hand Dominance Right   Extremity/Trunk Assessment Upper Extremity Assessment Upper Extremity Assessment: Overall WFL for tasks assessed (+tremor)   Lower Extremity Assessment Lower Extremity Assessment: Defer to PT evaluation       Communication Communication Communication: No difficulties   Cognition Arousal/Alertness: Awake/alert Behavior During Therapy: WFL for tasks assessed/performed Overall Cognitive Status: Impaired/Different from baseline Area of Impairment: Orientation;Attention;Memory;Safety/judgement;Problem solving;Following commands Orientation Level: Disoriented to;Time Current Attention Level: Selective Memory: Decreased short-term memory Following Commands: Follows one step commands with increased time Safety/Judgement: Decreased awareness of safety;Decreased awareness of deficits   Problem Solving: Slow processing;Difficulty sequencing;Requires verbal cues;Requires tactile cues      General Comments  Exercises       Shoulder Instructions      Home Living Family/patient expects to be discharged to:: Private residence Living Arrangements: Spouse/significant other (girlfriend) Available Help at Discharge: Available 24 hours/day (girlfriend) Type of Home: House Home Access: Stairs to enter [employer] of Steps: 1+1 Entrance Stairs-Rails: None Home Layout: Two level;Able to live on main level with bedroom/bathroom     Bathroom Shower/Tub: Occupational psychologist: Standard     Home Equipment: None          Prior Functioning/Environment Level of Independence: Independent                 OT Problem List: Decreased strength;Decreased activity tolerance;Impaired balance (sitting and/or standing);Decreased cognition;Decreased safety awareness;Decreased knowledge of use of DME or AE;Pain   OT Treatment/Interventions: Self-care/ADL training;DME and/or AE instruction;Therapeutic activities;Cognitive remediation/compensation;Patient/family education;Balance training    OT Goals(Current goals can be found in the care plan section) Acute Rehab OT Goals Patient Stated Goal: to return to PLOF OT Goal Formulation: With patient Time For Goal Achievement: 03/12/16 Potential to Achieve Goals: Good ADL Goals Pt Will Perform Grooming: with supervision;standing Pt Will Perform Lower Body Bathing: with supervision;with adaptive equipment;sit to/from stand Pt Will Perform Lower Body Dressing: with supervision;sit to/from stand;with adaptive equipment Pt Will Transfer to Toilet: with supervision;ambulating;bedside commode (over toilet) Pt Will Perform Toileting - Clothing Manipulation and hygiene: with supervision;sit to/from stand Pt Will Perform Tub/Shower Transfer: Shower transfer;with min guard assist;ambulating;3 in 1;rolling walker Additional ADL Goal #1: Pt will utilize back precautions for comfort during ADL and mobility with min  reminders. Additional ADL Goal #2: Pt will don back brace with supervision.  OT Frequency: Min 2X/week   Barriers to D/C:            Co-evaluation PT/OT/SLP Co-Evaluation/Treatment: Yes Reason for Co-Treatment: For patient/therapist safety   OT goals addressed during session: ADL's and self-care      End of Session Equipment Utilized During Treatment: Rolling walker;Gait belt;Back brace Nurse Communication: Mobility status  Activity Tolerance: Patient tolerated treatment well Patient left: in chair;with call bell/phone within reach;with chair alarm set   Time: [id number] OT Time Calculation (min): 45 min Charges:  OT General Charges $OT Visit: 1 Procedure OT Evaluation $OT Eval Moderate Complexity: 1 Procedure OT Treatments $Therapeutic Activity: 8-22 mins G-Codes:    Malka So 02/27/2016, 1:06 PM  [phone]

## 2016-02-27 NOTE — Progress Notes (Signed)
Rehab Admissions Coordinator Note:  Patient was screened by Cleatrice Burke for appropriateness for an Inpatient Acute Rehab Consult per OT recommendation.  At this time, we are recommending Inpatient Rehab consult.  Cleatrice Burke 02/27/2016, 1:21 PM  I can be reached at [phone].

## 2016-02-27 NOTE — Progress Notes (Signed)
ANTICOAGULATION CONSULT NOTE - Follow Up Consult  Pharmacy Consult for Heparin  Indication: atrial fibrillation  Allergies  Allergen Reactions  . Other Other (See Comments)    Pt reports allergic reaction to melons - unknown reaction  . Ciprofloxacin Hives   Patient Measurements: Height: 6\' 1"  (185.4 cm) Weight: 212 lb 8.4 oz (96.4 kg) IBW/kg (Calculated) : 79.9  Vital Signs: Temp: 98.1 F (36.7 C) (02/08 0726) Temp Source: Oral (02/08 0726) BP: 147/97 (02/08 0726) Pulse Rate: 116 (02/08 0726)  Labs:  Recent Labs  02/25/16 0401 02/26/16 0235 02/26/16 1625 02/27/16 0302  HGB 12.1* 11.6* 12.4* 12.2*  HCT 35.2* 34.6* 35.8* 35.4*  PLT 207 239 256 276  HEPARINUNFRC 0.32 0.38  --  0.35  CREATININE 0.85  --   --   --      Assessment: Heparin for atrial fibrillation.  Heparin level remains therapeutic this am at 0.35. No surgical intervention planned.   Goal of Therapy:  Heparin level 0.3-0.7 units/ml Monitor platelets by anticoagulation protocol: Yes   Plan:  -Continue heparin infusion at 2400 units/hr -Daily heparin level and CBC  -Follow up on feasibility on switch to oral anticoagulation   Vincenza Hews, PharmD, BCPS 02/27/2016, 8:02 AM

## 2016-02-27 NOTE — Progress Notes (Signed)
Patient is c/o 10/10 pain in lower back radiating down to his calf muscles and requests pain medicine. NP texted page and has ordered a one time dose of Hydrocodone- Acetaminophen 10-325 for relief. Patient is alert and oriented to person, place, situation, but is disoriented to time but this is most likely due to fatigue and waking from sleep. Will administered pain medicine and continue to monitor.

## 2016-02-27 NOTE — Progress Notes (Signed)
Subjective:   Procedure(s) (LRB): TRANSESOPHAGEAL ECHOCARDIOGRAM (TEE) (N/A) Patient reports pain as 3 on 0-10 scale.He is doing very well. Up with PT and will order a Lumbar support. Appreciate Dr. Hewitt Shorts input. Agree thar no surgery is needed. He really needs PT. Temp. 99 and WBC is now NORMAL. We will be available as needed.    Objective: Vital signs in last 24 hours: Temp:  [97.7 F (36.5 C)-98.4 F (36.9 C)] 97.7 F (36.5 C) (02/08 0425) Pulse Rate:  [97-139] 118 (02/08 0425) Resp:  [19-26] 24 (02/08 0425) BP: (143-159)/(93-121) 149/101 (02/08 0425) SpO2:  [96 %-100 %] 98 % (02/08 0425) Weight:  [96.4 kg (212 lb 8.4 oz)] 96.4 kg (212 lb 8.4 oz) (02/08 0425)  Intake/Output from previous day: 02/07 0701 - 02/08 0700 In: 2421.8 [P.O.:1300; I.V.:621.8; IV Piggyback:500] Out: [id number] [Urine:3275] Intake/Output this shift: Total I/O In: 1508 [P.O.:720; I.V.:288; IV Piggyback:500] Out: 1125 [Urine:1125]   Recent Labs  02/25/16 0401 02/26/16 0235 02/26/16 1625 02/27/16 0302  HGB 12.1* 11.6* 12.4* 12.2*    Recent Labs  02/26/16 1625 02/27/16 0302  WBC 11.0* 9.8  RBC 3.88* 3.85*  HCT 35.8* 35.4*  PLT 256 276    Recent Labs  02/25/16 0401  NA 134*  K 4.2  CL 104  CO2 19*  BUN 18  CREATININE 0.85  GLUCOSE 134*  CALCIUM 7.8*   No results for input(s): LABPT, INR in the last 72 hours.  Neurologically intact Dorsiflexion/Plantar flexion intact  Assessment/Plan:   Procedure(s) (LRB): TRANSESOPHAGEAL ECHOCARDIOGRAM (TEE) (N/A) Up with therapy  Kele Withem A 02/27/2016, 6:50 AM

## 2016-02-28 ENCOUNTER — Ambulatory Visit (HOSPITAL_COMMUNITY): Payer: Medicare Other

## 2016-02-28 ENCOUNTER — Encounter (HOSPITAL_COMMUNITY): Payer: Self-pay

## 2016-02-28 ENCOUNTER — Encounter (HOSPITAL_COMMUNITY)
Admission: EM | Disposition: A | Discharge status: Rehabilitation Facility | Payer: Self-pay | Source: Home / Self Care | Attending: Internal Medicine

## 2016-02-28 ENCOUNTER — Inpatient Hospital Stay (HOSPITAL_COMMUNITY)
Admission: RE | Admit: 2016-02-28 | Discharge: 2016-03-09 | DRG: 552 | Disposition: A | Discharge status: Home Health | Payer: Medicare Other | Source: Intra-hospital | Attending: Physical Medicine & Rehabilitation | Admitting: Physical Medicine & Rehabilitation

## 2016-02-28 DIAGNOSIS — E8809 Other disorders of plasma-protein metabolism, not elsewhere classified: Secondary | ICD-10-CM

## 2016-02-28 DIAGNOSIS — D62 Acute posthemorrhagic anemia: Secondary | ICD-10-CM | POA: Diagnosis present

## 2016-02-28 DIAGNOSIS — Z91018 Allergy to other foods: Secondary | ICD-10-CM

## 2016-02-28 DIAGNOSIS — Z955 Presence of coronary angioplasty implant and graft: Secondary | ICD-10-CM | POA: Diagnosis not present

## 2016-02-28 DIAGNOSIS — Z881 Allergy status to other antibiotic agents status: Secondary | ICD-10-CM | POA: Diagnosis not present

## 2016-02-28 DIAGNOSIS — I2510 Atherosclerotic heart disease of native coronary artery without angina pectoris: Secondary | ICD-10-CM | POA: Diagnosis present

## 2016-02-28 DIAGNOSIS — E785 Hyperlipidemia, unspecified: Secondary | ICD-10-CM | POA: Diagnosis present

## 2016-02-28 DIAGNOSIS — R4189 Other symptoms and signs involving cognitive functions and awareness: Secondary | ICD-10-CM | POA: Diagnosis present

## 2016-02-28 DIAGNOSIS — G9340 Encephalopathy, unspecified: Secondary | ICD-10-CM | POA: Diagnosis not present

## 2016-02-28 DIAGNOSIS — I4891 Unspecified atrial fibrillation: Secondary | ICD-10-CM | POA: Diagnosis present

## 2016-02-28 DIAGNOSIS — M4646 Discitis, unspecified, lumbar region: Secondary | ICD-10-CM | POA: Diagnosis present

## 2016-02-28 DIAGNOSIS — I272 Pulmonary hypertension, unspecified: Secondary | ICD-10-CM

## 2016-02-28 DIAGNOSIS — B955 Unspecified streptococcus as the cause of diseases classified elsewhere: Secondary | ICD-10-CM | POA: Diagnosis present

## 2016-02-28 DIAGNOSIS — F09 Unspecified mental disorder due to known physiological condition: Secondary | ICD-10-CM | POA: Diagnosis not present

## 2016-02-28 DIAGNOSIS — I10 Essential (primary) hypertension: Secondary | ICD-10-CM | POA: Diagnosis present

## 2016-02-28 DIAGNOSIS — M545 Low back pain, unspecified: Secondary | ICD-10-CM

## 2016-02-28 DIAGNOSIS — R7881 Bacteremia: Secondary | ICD-10-CM | POA: Diagnosis present

## 2016-02-28 DIAGNOSIS — I252 Old myocardial infarction: Secondary | ICD-10-CM | POA: Diagnosis not present

## 2016-02-28 DIAGNOSIS — R32 Unspecified urinary incontinence: Secondary | ICD-10-CM | POA: Diagnosis not present

## 2016-02-28 DIAGNOSIS — K219 Gastro-esophageal reflux disease without esophagitis: Secondary | ICD-10-CM | POA: Diagnosis not present

## 2016-02-28 DIAGNOSIS — K5900 Constipation, unspecified: Secondary | ICD-10-CM | POA: Diagnosis present

## 2016-02-28 DIAGNOSIS — I361 Nonrheumatic tricuspid (valve) insufficiency: Secondary | ICD-10-CM

## 2016-02-28 DIAGNOSIS — B001 Herpesviral vesicular dermatitis: Secondary | ICD-10-CM | POA: Diagnosis present

## 2016-02-28 DIAGNOSIS — M4640 Discitis, unspecified, site unspecified: Secondary | ICD-10-CM | POA: Diagnosis present

## 2016-02-28 DIAGNOSIS — M4696 Unspecified inflammatory spondylopathy, lumbar region: Secondary | ICD-10-CM | POA: Diagnosis not present

## 2016-02-28 DIAGNOSIS — Z981 Arthrodesis status: Secondary | ICD-10-CM | POA: Diagnosis not present

## 2016-02-28 DIAGNOSIS — E46 Unspecified protein-calorie malnutrition: Secondary | ICD-10-CM

## 2016-02-28 DIAGNOSIS — M5440 Lumbago with sciatica, unspecified side: Secondary | ICD-10-CM

## 2016-02-28 DIAGNOSIS — M4649 Discitis, unspecified, multiple sites in spine: Secondary | ICD-10-CM | POA: Diagnosis not present

## 2016-02-28 DIAGNOSIS — Z79899 Other long term (current) drug therapy: Secondary | ICD-10-CM | POA: Diagnosis not present

## 2016-02-28 DIAGNOSIS — E784 Other hyperlipidemia: Secondary | ICD-10-CM

## 2016-02-28 DIAGNOSIS — M4630 Infection of intervertebral disc (pyogenic), site unspecified: Secondary | ICD-10-CM | POA: Diagnosis not present

## 2016-02-28 DIAGNOSIS — J069 Acute upper respiratory infection, unspecified: Secondary | ICD-10-CM | POA: Diagnosis not present

## 2016-02-28 DIAGNOSIS — Z87891 Personal history of nicotine dependence: Secondary | ICD-10-CM | POA: Diagnosis not present

## 2016-02-28 DIAGNOSIS — Z8249 Family history of ischemic heart disease and other diseases of the circulatory system: Secondary | ICD-10-CM

## 2016-02-28 HISTORY — PX: TEE WITHOUT CARDIOVERSION: SHX5443

## 2016-02-28 LAB — CBC
HCT: 37.2 % — ABNORMAL LOW (ref 39.0–52.0)
Hemoglobin: 12.3 g/dL — ABNORMAL LOW (ref 13.0–17.0)
MCH: 31.1 pg (ref 26.0–34.0)
MCHC: 33.1 g/dL (ref 30.0–36.0)
MCV: 94.2 fL (ref 78.0–100.0)
PLATELETS: 315 10*3/uL (ref 150–400)
RBC: 3.95 MIL/uL — AB (ref 4.22–5.81)
RDW: 14.5 % (ref 11.5–15.5)
WBC: 9.5 10*3/uL (ref 4.0–10.5)

## 2016-02-28 LAB — CULTURE, BLOOD (ROUTINE X 2)

## 2016-02-28 SURGERY — ECHOCARDIOGRAM, TRANSESOPHAGEAL
Anesthesia: Moderate Sedation

## 2016-02-28 MED ORDER — CYCLOBENZAPRINE HCL 10 MG PO TABS: 10.0000 mg | ORAL_TABLET | Freq: Two times a day (BID) | ORAL | 0 refills | Status: DC

## 2016-02-28 MED ORDER — APIXABAN 5 MG PO TABS: 5.0000 mg | ORAL_TABLET | Freq: Two times a day (BID) | ORAL | 0 refills | Status: DC

## 2016-02-28 MED ORDER — POLYETHYLENE GLYCOL 3350 17 G PO PACK
17.0000 g | PACK | Freq: Every day | ORAL | Status: DC | PRN
  Administered 2016-03-05 – 2016-03-09 (×3): 17 g via ORAL
  Filled 2016-02-28 (×3): qty 1

## 2016-02-28 MED ORDER — SORBITOL 70 % SOLN
30.0000 mL | Freq: Every day | Status: DC | PRN
  Administered 2016-03-03 – 2016-03-06 (×3): 30 mL via ORAL
  Filled 2016-02-28 (×5): qty 30

## 2016-02-28 MED ORDER — LIDOCAINE 5 % EX PTCH: 2.0000 | MEDICATED_PATCH | CUTANEOUS | 0 refills | Status: DC

## 2016-02-28 MED ORDER — LIDOCAINE VISCOUS 2 % MT SOLN
OROMUCOSAL | Status: DC | PRN
  Administered 2016-02-28: 1 via OROMUCOSAL

## 2016-02-28 MED ORDER — DIPHENHYDRAMINE HCL 50 MG/ML IJ SOLN
INTRAMUSCULAR | Status: AC
  Filled 2016-02-28: qty 1

## 2016-02-28 MED ORDER — ATORVASTATIN CALCIUM 40 MG PO TABS
40.0000 mg | ORAL_TABLET | Freq: Every day | ORAL | Status: DC
  Administered 2016-02-29 – 2016-03-08 (×8): 40 mg via ORAL
  Filled 2016-02-28 (×10): qty 1

## 2016-02-28 MED ORDER — ONDANSETRON HCL 4 MG PO TABS
4.0000 mg | ORAL_TABLET | Freq: Four times a day (QID) | ORAL | Status: DC | PRN
  Filled 2016-02-28: qty 1

## 2016-02-28 MED ORDER — MIDAZOLAM HCL 5 MG/ML IJ SOLN
INTRAMUSCULAR | Status: AC
  Filled 2016-02-28: qty 2

## 2016-02-28 MED ORDER — METOPROLOL TARTRATE 50 MG PO TABS
125.0000 mg | ORAL_TABLET | Freq: Two times a day (BID) | ORAL | Status: DC
  Administered 2016-02-28 – 2016-03-08 (×17): 125 mg via ORAL
  Filled 2016-02-28 (×6): qty 2
  Filled 2016-02-28: qty 1
  Filled 2016-02-28 (×5): qty 2
  Filled 2016-02-28: qty 1
  Filled 2016-02-28: qty 2
  Filled 2016-02-28: qty 1
  Filled 2016-02-28 (×2): qty 2
  Filled 2016-02-28: qty 1
  Filled 2016-02-28 (×3): qty 2

## 2016-02-28 MED ORDER — ACETAMINOPHEN 650 MG RE SUPP: 650.0000 mg | Freq: Four times a day (QID) | RECTAL | Status: DC | PRN

## 2016-02-28 MED ORDER — DILTIAZEM HCL 90 MG PO TABS: 90.0000 mg | ORAL_TABLET | Freq: Four times a day (QID) | ORAL | 0 refills | Status: DC

## 2016-02-28 MED ORDER — DILTIAZEM HCL 90 MG PO TABS
90.0000 mg | ORAL_TABLET | Freq: Four times a day (QID) | ORAL | Status: DC
  Administered 2016-02-29 – 2016-03-06 (×24): 90 mg via ORAL
  Filled 2016-02-28 (×28): qty 1

## 2016-02-28 MED ORDER — FAMOTIDINE 20 MG PO TABS
20.0000 mg | ORAL_TABLET | Freq: Two times a day (BID) | ORAL | Status: DC
  Administered 2016-02-28 – 2016-03-01 (×4): 20 mg via ORAL
  Filled 2016-02-28 (×4): qty 1

## 2016-02-28 MED ORDER — ACETAMINOPHEN 325 MG PO TABS
650.0000 mg | ORAL_TABLET | Freq: Four times a day (QID) | ORAL | Status: DC | PRN
  Administered 2016-03-01 (×2): 650 mg via ORAL
  Filled 2016-02-28 (×2): qty 2

## 2016-02-28 MED ORDER — ATORVASTATIN CALCIUM 40 MG PO TABS: 40.0000 mg | ORAL_TABLET | Freq: Every day | ORAL | 0 refills | Status: DC

## 2016-02-28 MED ORDER — THIAMINE HCL 100 MG PO TABS: 100.0000 mg | ORAL_TABLET | Freq: Every day | ORAL | 0 refills | Status: DC

## 2016-02-28 MED ORDER — MIDAZOLAM HCL 10 MG/2ML IJ SOLN
INTRAMUSCULAR | Status: DC | PRN
  Administered 2016-02-28: 2 mg via INTRAVENOUS
  Administered 2016-02-28: 1 mg via INTRAVENOUS

## 2016-02-28 MED ORDER — MELATONIN 3 MG PO TABS
9.0000 mg | ORAL_TABLET | Freq: Every evening | ORAL | Status: DC | PRN
  Administered 2016-02-28 – 2016-03-03 (×3): 9 mg via ORAL
  Filled 2016-02-28 (×4): qty 3

## 2016-02-28 MED ORDER — LOPERAMIDE HCL 2 MG PO CAPS: 2.0000 mg | ORAL_CAPSULE | ORAL | 0 refills | Status: DC | PRN

## 2016-02-28 MED ORDER — LISINOPRIL 20 MG PO TABS
20.0000 mg | ORAL_TABLET | Freq: Every day | ORAL | Status: DC
  Administered 2016-02-29 – 2016-03-05 (×6): 20 mg via ORAL
  Filled 2016-02-28 (×6): qty 1

## 2016-02-28 MED ORDER — FENTANYL CITRATE (PF) 100 MCG/2ML IJ SOLN
INTRAMUSCULAR | Status: AC
  Filled 2016-02-28: qty 2

## 2016-02-28 MED ORDER — OXYCODONE-ACETAMINOPHEN 5-325 MG PO TABS: 1.0000 | ORAL_TABLET | ORAL | 0 refills | Status: DC | PRN

## 2016-02-28 MED ORDER — PENICILLIN G POTASSIUM IV (FOR PTA / DISCHARGE USE ONLY): 24.0000 10*6.[IU] | INTRAVENOUS | 0 refills | Status: DC

## 2016-02-28 MED ORDER — LIDOCAINE 5 % EX PTCH
2.0000 | MEDICATED_PATCH | CUTANEOUS | Status: DC
  Administered 2016-02-28 – 2016-03-08 (×10): 2 via TRANSDERMAL
  Filled 2016-02-28 (×10): qty 2

## 2016-02-28 MED ORDER — FOLIC ACID 1 MG PO TABS
1.0000 mg | ORAL_TABLET | Freq: Every day | ORAL | Status: DC
  Administered 2016-02-29 – 2016-03-09 (×10): 1 mg via ORAL
  Filled 2016-02-28 (×10): qty 1

## 2016-02-28 MED ORDER — APIXABAN 5 MG PO TABS
5.0000 mg | ORAL_TABLET | Freq: Two times a day (BID) | ORAL | Status: DC
  Administered 2016-02-28 – 2016-03-09 (×19): 5 mg via ORAL
  Filled 2016-02-28 (×16): qty 1
  Filled 2016-02-28: qty 2
  Filled 2016-02-28 (×3): qty 1

## 2016-02-28 MED ORDER — ONDANSETRON HCL 4 MG PO TABS: 4.0000 mg | ORAL_TABLET | Freq: Four times a day (QID) | ORAL | 0 refills | Status: DC | PRN

## 2016-02-28 MED ORDER — PANTOPRAZOLE SODIUM 40 MG PO TBEC
40.0000 mg | DELAYED_RELEASE_TABLET | Freq: Every day | ORAL | Status: DC
  Administered 2016-02-29: 40 mg via ORAL
  Filled 2016-02-28: qty 1

## 2016-02-28 MED ORDER — METOPROLOL TARTRATE 25 MG PO TABS
125.0000 mg | ORAL_TABLET | Freq: Two times a day (BID) | ORAL | 0 refills | Status: DC
Start: 2016-02-28 — End: 2016-03-09

## 2016-02-28 MED ORDER — ONDANSETRON HCL 4 MG/2ML IJ SOLN: 4.0000 mg | Freq: Four times a day (QID) | INTRAMUSCULAR | Status: DC | PRN

## 2016-02-28 MED ORDER — PANTOPRAZOLE SODIUM 40 MG PO TBEC: 40.0000 mg | DELAYED_RELEASE_TABLET | Freq: Every day | ORAL | 0 refills | Status: DC

## 2016-02-28 MED ORDER — PENICILLIN G POTASSIUM 5000000 UNITS IJ SOLR
4.0000 10*6.[IU] | INTRAVENOUS | Status: DC
  Administered 2016-02-28 – 2016-03-09 (×57): 4 10*6.[IU] via INTRAVENOUS
  Filled 2016-02-28 (×62): qty 4

## 2016-02-28 MED ORDER — VITAMIN B-1 100 MG PO TABS
100.0000 mg | ORAL_TABLET | Freq: Every day | ORAL | Status: DC
  Administered 2016-02-29 – 2016-03-09 (×10): 100 mg via ORAL
  Filled 2016-02-28 (×10): qty 1

## 2016-02-28 MED ORDER — OXYCODONE-ACETAMINOPHEN 5-325 MG PO TABS
1.0000 | ORAL_TABLET | ORAL | Status: DC | PRN
  Administered 2016-03-02 – 2016-03-06 (×10): 1 via ORAL
  Filled 2016-02-28 (×11): qty 1

## 2016-02-28 MED ORDER — FAMOTIDINE 20 MG PO TABS: 20.0000 mg | ORAL_TABLET | Freq: Two times a day (BID) | ORAL | 0 refills | Status: DC

## 2016-02-28 MED ORDER — ALIROCUMAB 150 MG/ML ~~LOC~~ SOPN: 150.0000 mg | PEN_INJECTOR | SUBCUTANEOUS | Status: DC

## 2016-02-28 MED ORDER — MELATONIN 3 MG PO TABS: 9.0000 mg | ORAL_TABLET | Freq: Every evening | ORAL | 0 refills | Status: DC | PRN

## 2016-02-28 MED ORDER — LISINOPRIL 20 MG PO TABS: 20.0000 mg | ORAL_TABLET | Freq: Every day | ORAL | 0 refills | Status: DC

## 2016-02-28 MED ORDER — FENTANYL CITRATE (PF) 100 MCG/2ML IJ SOLN
INTRAMUSCULAR | Status: DC | PRN
  Administered 2016-02-28: 25 ug via INTRAVENOUS

## 2016-02-28 MED ORDER — ADULT MULTIVITAMIN W/MINERALS CH
1.0000 | ORAL_TABLET | Freq: Every day | ORAL | Status: DC
  Administered 2016-02-29 – 2016-03-09 (×10): 1 via ORAL
  Filled 2016-02-28 (×10): qty 1

## 2016-02-28 MED ORDER — PREDNISOLONE ACETATE 1 % OP SUSP: 1.0000 [drp] | Freq: Three times a day (TID) | OPHTHALMIC | 0 refills | Status: DC

## 2016-02-28 MED ORDER — PREDNISOLONE ACETATE 1 % OP SUSP
1.0000 [drp] | Freq: Three times a day (TID) | OPHTHALMIC | Status: DC
  Administered 2016-02-28 – 2016-03-02 (×5): 1 [drp] via OPHTHALMIC
  Filled 2016-02-28: qty 1

## 2016-02-28 MED ORDER — LOPERAMIDE HCL 2 MG PO CAPS
2.0000 mg | ORAL_CAPSULE | ORAL | Status: DC | PRN
  Administered 2016-03-06: 2 mg via ORAL
  Filled 2016-02-28: qty 1

## 2016-02-28 MED ORDER — FOLIC ACID 1 MG PO TABS: 1.0000 mg | ORAL_TABLET | Freq: Every day | ORAL | 0 refills | Status: DC

## 2016-02-28 MED ORDER — LIDOCAINE VISCOUS 2 % MT SOLN
OROMUCOSAL | Status: AC
  Filled 2016-02-28: qty 15

## 2016-02-28 NOTE — Discharge Summary (Signed)
Physician Discharge Summary  Jonathan Holden GGY:[id number] DOB: [birth year] DOA: 02/21/2016  PCP: Jonathan Grooms, MD  Admit date: 02/21/2016 Discharge date: 02/28/2016  Time spent: 35 minutes  Recommendations for Outpatient Follow-up: Sepsis unspecified organism / positive strep intermedius bacteremia (2 of 2 blood cx)  - Per Dr. Latanya Maudlin orthopedic surgery note from 2/6 feel that patient has a disc space infection not a paraspinal abscess. -no exam findings to suggest cord compression today -Per Dr.Kyle Cabbell neurosurgery no need for surgical intervention. -Continue antibiotics per ID: 6 weeks IV antibiotics. -TEE per ID on 2/9 -  Infection intravertebral disc vs Abcess?/ Ruptured disk in L spine -See  Dr. Latanya Maudlin orthopedic surgery note from 2/6 -See sepsis  Newly diagnosed Afib w/ RVR(CHA2DS2-VASc is at least 2)  -TSH is normal - HR well controlled at this time -Continue Eliquis per cardiology -Patient's HR still not optimal however have just increased metoprolol on 2/8, will allow CIR to titrate medication to effect.    - Cardizem 90 mg QID -Lisinopril 20 mg daily -Metoprolol 125 mg BID -2/9 TEE did not show definite vegetation. See results below -CIR to arrange follow-up with Dr. Grayland [facility] for A. fib with RVR,  HTN BP stable at present   Pulmonary hypertension -Echocardiogram: See results below -TEE pending for 2/9  Diarrhea -Multiple loose stools noted over night 2/4- Cdiff testing negative,likely abx associated  -Continue Imodium PRN    Hypokalemia -Resolved; Due to GI losses, CIR to continue to monitor   CAD Remote inferior MI - asymptomatic at this time   HLD -Lipitor 40 mg daily   GERD     Discharge Diagnoses:  Principal Problem:   Sepsis ([place]) Active Problems:   Hyperlipidemia   CAD (coronary artery disease), native coronary artery   Essential hypertension   Hyponatremia   Atrial fibrillation with RVR  (HCC)   Back pain   Acute encephalopathy   Streptococcal bacteremia   Lumbar discitis   Endocarditis of native valve   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 He visited a physician and was given a prescription for steroids and hydrocodone, which he said made him feel "weird". His back pain did not improve and he sought medical attention again. On his return visit the patient was given Flexeril and Percocet in addition to his steroids. A stat MRI was done in the office of his Orthopedic Surgeon on 02/20/2016 and noted a large HNP at L3-L4 and a prior laminotomy at [facility]. The pt was instructed to present to the ED.   In the ED the patient was found to be in SVT and was given adenosine which revealed A. fib with RVR. He was started on a diltiazem drip after which he spontaneously converted to normal sinus rhythm. During this stay patient was diagnosed with bacteremia positive for strep intermedius. In addition he was evaluated by neurosurgery for  intravertebral disc infection. Patient will need a minimum of 6 weeks IV antibiotics per ID. PICC line has been placed. Patient due for TEE today. Currently stable and barring any unforeseen complications with TEE will be stable for discharge to CIR.    Consultants:  Dr.Kyle Cabbell/Dr. Gladstone Lighter  neurosurgery [facility]oped[facility]     Procedures/Significant Events:  2/6 Echocardiogram:- LVEF  = 55% to 60%. - Pulmonary arteries: PA peak pressure: 47 mm Hg (S). 2/9 ALP:FXTKWI valve: AV is mildly thickened. No definite vegetation   though cannot exclude colonization  Cultures  2/2 blood positive strep intermedius 2/2 urine insignificant growth 2/2 MRSA by PCR negative 2/3 stool culture pending 2/3 C. difficile negative 2/5 blood 2 pending  Antibiotics Anti-infectives    Start     Stop   02/28/16 0000  penicillin G IVPB         02/27/16 0900  penicillin G potassium 4 Million Units in dextrose 5 % 250 mL IVPB  Status:  Discontinued     02/27/16 0847   02/27/16 0900  penicillin G potassium 4 Million Units in dextrose 5 % 250 mL IVPB         02/26/16 1800  penicillin G potassium 4 Million Units in dextrose 5 % 250 mL IVPB  Status:  Discontinued     02/27/16 0844   02/22/16 2130  vancomycin (VANCOCIN) 1,250 mg in sodium chloride 0.9 % 250 mL IVPB  Status:  Discontinued     02/22/16 1626   02/22/16 1800  cefTRIAXone (ROCEPHIN) 2 g in dextrose 5 % 50 mL IVPB  Status:  Discontinued     02/26/16 1702   02/21/16 2200  oseltamivir (TAMIFLU) capsule 75 mg  Status:  Discontinued     02/22/16 1407   02/21/16 1800  vancomycin (VANCOCIN) IVPB 1000 mg/200 mL premix  Status:  Discontinued     02/22/16 1229   02/21/16 1800  piperacillin-tazobactam (ZOSYN) IVPB 3.375 g  Status:  Discontinued     02/22/16 1626   02/21/16 1015  piperacillin-tazobactam (ZOSYN) IVPB 3.375 g     02/21/16 1100   02/21/16 1015  vancomycin (VANCOCIN) IVPB 1000 mg/200 mL premix     02/21/16 1132   02/21/16 0730  oseltamivir (TAMIFLU) capsule 75 mg     02/21/16 0802       Discharge Exam: Vitals:   02/27/16 2308 02/28/16 0425 02/28/16 0500 02/28/16 0700  BP: 118/87 137/79  134/86  Pulse:  (!) 103  (!) 104  Resp:  18  16  Temp: 98.6 F (37 C) 98.4 F (36.9 C)  98.3 F (36.8 C)  TempSrc: Oral Oral  Oral  SpO2:  98%  97%  Weight:   96.5 kg (212 lb 11.9 oz)   Height:        General: A/O 4,  NAD, No acute respiratory distress Eyes: negative scleral hemorrhage, negative anisocoria, negative icterus ENT: Negative Runny nose, negative gingival bleeding, Neck:  Negative scars, masses, torticollis, lymphadenopathy, JVD Lungs: Clear to auscultation bilaterally without wheezes or crackles Cardiovascular: Regular rate and rhythm without murmur gallop or rub normal S1 and S2  Discharge Instructions  Discharge Instructions    Home infusion instructions [facility] May follow [place] Dosing Protocol; May administer Cathflo as needed to maintain patency of vascular access device.; Flushing of vascular access device: per [facility] Protocol: 0.9% NaCl pre/post medica...    Complete by:  As directed    Instructions:  May follow [place] Dosing Protocol  Instructions:  May administer Cathflo as needed to maintain patency of vascular access device.   Instructions:  Flushing of vascular access device: per [facility] Protocol: 0.9% NaCl pre/post medication administration and prn patency; Heparin 100 u/ml, 93m for implanted ports and Heparin 10u/ml, 518mfor all other central venous catheters.   Instructions:  May follow [facility] Anaphylaxis Protocol for First Dose Administration in the home: 0.9% NaCl at 25-50 ml/hr to maintain IV access for protocol meds. Epinephrine 0.3 ml IV/IM PRN and Benadryl 25-50 IV/IM PRN s/s of anaphylaxis.   Instructions:  [facility] Coordinator (RN) to assist per patient IV care needs in the home PRN.     Allergies as of 02/28/2016      Reactions   Other Other (See Comments)   Pt reports allergic reaction to melons - unknown reaction   Ciprofloxacin Hives      Medication List    STOP taking these medications   ALEVE 220 MG tablet Generic drug:  naproxen sodium   HYDROcodone-acetaminophen 5-325 MG tablet Commonly known as:  NORCO/VICODIN   lisinopril-hydrochlorothiazide 20-12.5 MG tablet Commonly known as:  PRINZIDE,ZESTORETIC    methylPREDNISolone 4 MG Tbpk tablet Commonly known as:  MEDROL DOSEPAK   metoprolol succinate 25 MG 24 hr tablet Commonly known as:  TOPROL XL   MOVANTIK 25 MG Tabs tablet Generic drug:  naloxegol oxalate   mupirocin ointment 2 % Commonly known as:  BACTROBAN   oxyCODONE-acetaminophen 10-325 MG tablet Commonly known as:  PERCOCET Replaced by:  oxyCODONE-acetaminophen 5-325 MG tablet     TAKE these medications   Alirocumab 150 MG/ML Sopn Commonly known as:  PRALUENT Inject 150 mg into the skin every 14 (fourteen) days.   apixaban 5 MG Tabs tablet Commonly known as:  ELIQUIS Take 1 tablet (5 mg total) by mouth 2 (two) times daily.   atorvastatin 40 MG tablet Commonly known as:  LIPITOR Take 1 tablet (40 mg total) by mouth daily. Start taking on:  02/29/2016 What changed:  medication strength   COQ10 PO Take 1 capsule by mouth daily.   cyclobenzaprine 10 MG tablet Commonly known as:  FLEXERIL Take 1 tablet (10 mg total) by mouth 2 (two) times daily.   diltiazem 90 MG tablet Commonly known as:  CARDIZEM Take 1 tablet (90 mg total) by mouth every 6 (six) hours.   famotidine 20 MG tablet Commonly known as:  PEPCID Take 1 tablet (20 mg total) by mouth 2 (two) times daily.   Fish Oil 1000 MG Caps Take 1,000 mg by mouth daily.   folic acid 1 MG tablet Commonly known as:  FOLVITE Take 1 tablet (1 mg total) by mouth daily. Start taking on:  02/29/2016   GLUCOSAMINE-CHONDROITIN PO Take 2 tablets by mouth daily.   lidocaine 5 % Commonly known as:  LIDODERM Place 2 patches onto the skin daily. Remove & Discard patch within 12 hours or as directed by MD   lisinopril 20 MG tablet Commonly known as:  PRINIVIL,ZESTRIL Take 1 tablet (20 mg total) by mouth daily. Start taking on:  02/29/2016   loperamide 2 MG capsule Commonly known as:  IMODIUM Take 1 capsule (2 mg total) by mouth as needed for diarrhea or loose stools.   Melatonin 3 MG Tabs Take 3 tablets (9 mg  total) by mouth at bedtime as needed (sleep). What changed:  medication strength  how much to take   metoprolol tartrate 25 MG tablet Commonly known as:  LOPRESSOR Take 5 tablets (  125 mg total) by mouth 2 (two) times daily.   multivitamin with minerals Tabs tablet Take 1 tablet by mouth daily. Centrum   ondansetron 4 MG tablet Commonly known as:  ZOFRAN Take 1 tablet (4 mg total) by mouth every 6 (six) hours as needed for nausea.   oxyCODONE-acetaminophen 5-325 MG tablet Commonly known as:  PERCOCET/ROXICET Take 1 tablet by mouth every 4 (four) hours as needed for severe pain. Replaces:  oxyCODONE-acetaminophen 10-325 MG tablet   pantoprazole 40 MG tablet Commonly known as:  PROTONIX Take 1 tablet (40 mg total) by mouth daily at 12 noon.   penicillin G IVPB Inject 24 Million Units into the vein daily. As continuous infusion Indication:  Bacteremia, endocarditis, osteomyelitis  Last Day of Therapy:  04/05/16 Labs - Once weekly:  CBC/D and BMP, Labs - Every other week:  ESR and CRP   prednisoLONE acetate 1 % ophthalmic suspension Commonly known as:  PRED FORTE Place 1 drop into the left eye 3 (three) times daily.   thiamine 100 MG tablet Take 1 tablet (100 mg total) by mouth daily. Start taking on:  02/29/2016            Home Infusion Instuctions        Start     Ordered   02/28/16 0000  Home infusion instructions [facility] May follow [place] Dosing Protocol; May administer Cathflo as needed to maintain patency of vascular access device.; Flushing of vascular access device: per [facility] Protocol: 0.9% NaCl pre/post medica...    Question Answer Comment  Instructions May follow [place] Dosing Protocol   Instructions May administer Cathflo as needed to maintain patency of vascular access device.   Instructions Flushing of vascular access device: per [facility] Protocol: 0.9% NaCl pre/post medication administration and prn patency; Heparin 100 u/ml, 41m for  implanted ports and Heparin 10u/ml, 557mfor all other central venous catheters.   Instructions May follow [facility] Anaphylaxis Protocol for First Dose Administration in the home: 0.9% NaCl at 25-50 ml/hr to maintain IV access for protocol meds. Epinephrine 0.3 ml IV/IM PRN and Benadryl 25-50 IV/IM PRN s/s of anaphylaxis.   Instructions [facility] Infusion Coordinator (RN) to assist per patient IV care needs in the home PRN.      02/28/16 1013     Allergies  Allergen Reactions  . Other Other (See Comments)    Pt reports allergic reaction to melons - unknown reaction  . Ciprofloxacin Hives   Follow-up Information    PhMertie MooresMD. Schedule an appointment as soon as possible for a visit.   Specialty:  Cardiology Why:  CIR to arrange follow-up with Dr. PhPietro Cassisew-onset A. fib with RVR Contact information: [phone]          The results of significant diagnostics from this hospitalization (including imaging, microbiology, ancillary and laboratory) are listed below for reference.    Significant Diagnostic Studies: Dg Lumbar Spine Complete  Result Date: 02/17/2016 CLINICAL DATA:  Lumbosacral back pain after lifting boxes yesterday. EXAM: LUMBAR SPINE - COMPLETE 4+ VIEW COMPARISON:  None. FINDINGS: The alignment is maintained. Vertebral body heights are normal. No evidence of acute fracture. Disc space narrowing at L4-L5 and L5-S1. Facet arthropathy from L3-L4 through the lumbosacral junction. Minimal retrolisthesis of L5 on S1 appears degenerative. Sacroiliac joints are congruent. Atherosclerosis of the abdominal aorta. IMPRESSION: 1. No evidence of acute fracture or subluxation. 2. Degenerative disc  disease and facet arthropathy most prominent at L4-L5 and L5-S1. Electronically Signed   By: Jeb Levering M.D.   On: 02/17/2016 03:32   Ct Maxillofacial W Contrast  Addendum Date: 02/26/2016   ADDENDUM REPORT:  02/26/2016 17:06 ADDENDUM: There is mild lucency surrounding the roots of the most posterior remaining right mandibular molar. Otherwise, there is no abnormality of the remaining teeth. I discussed this on 02/26/2016 at 5:03 pm with Dr. Rhina Brackett DAM . Electronically Signed   By: Ulyses Jarred M.D.   On: 02/26/2016 17:06   Result Date: 02/26/2016 CLINICAL DATA:  Bacteremia.  Investigation for source. EXAM: CT MAXILLOFACIAL WITH CONTRAST TECHNIQUE: Multidetector CT imaging of the maxillofacial structures was performed. Multiplanar CT image reconstructions were also generated. A small metallic BB was placed on the right temple in order to reliably differentiate right from left. COMPARISON:  None. FINDINGS: Osseous: No facial fracture. Orbits: There is a left scleral band. Bilateral lens replacements. Otherwise normal. Sinuses --Frontal: There is mild left frontal sinus mucosal thickening inferiorly with suspected occlusion of the left frontal recess. No right frontal sinus mucosal thickening. The right frontal recess is patent. --Ethmoid: Minimal ethmoid sinus mucosal thickening without fluid levels or osseous changes. --Sphenoid: Minimal sphenoid mucosal thickening without fluid levels or osseous abnormalities. Sphenoethmoidal recesses and sphenoid ostia are patent. --Maxillary: Bilateral minimal maxillary mucosal thickening without fluid levels or osseous abnormalities. There is bilateral narrowing of the infundibular recesses. --Nasal cavity: 4 mm of rightward septal deviation. --Nasopharynx: Clear. --Mastoid air cells: No effusion. Soft tissues: Normal. Limited intracranial: Normal. IMPRESSION: Very mild mucosal thickening of all paranasal sinuses without evidence of acute sinusitis. 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 Electronically Signed: By: Ulyses Jarred M.D. On: 02/25/2016 19:49   Dg Chest Port 1 View  Result Date: 02/27/2016 CLINICAL DATA:  Status post right-sided  PICC line insertion. EXAM: PORTABLE CHEST 1 VIEW COMPARISON:  Portable chest x-ray of February 21, 2016 FINDINGS: The right-sided PICC line tip projects at the junction of middle and distal portions of the SVC. There is no postprocedure pneumothorax. The lungs are mildly hypoinflated. The interstitial markings are increased but have improved since the study of 6 days ago. The cardiac silhouette remains enlarged. The pulmonary vascularity is less engorged. The bony thorax exhibits no acute abnormality. IMPRESSION: Reasonable positioning of the PICC line with the tip at the junction of the middle and distal portions of the SVC. No postplacement complication. Electronically Signed   By: David  Martinique M.D.   On: 02/27/2016 12:40   Dg Chest Port 1 View  Result Date: 02/21/2016 CLINICAL DATA:  Sepsis. EXAM: PORTABLE CHEST 1 VIEW COMPARISON:  None. FINDINGS: Low lung volumes. Heart is prominent size, likely accentuated by a low lung volumes. There is diffuse bronchial and interstitial thickening. Streaky bibasilar atelectasis. No evidence of confluent pneumonia. No large pleural effusion or pneumothorax. No acute osseous abnormalities are seen. Advanced osteoarthritis of the right shoulder. IMPRESSION: Diffuse bronchial and interstitial thickening, may be pulmonary edema or atypical infection/bronchitis. Low lung volumes limit assessment. Electronically Signed   By: Jeb Levering M.D.   On: 02/21/2016 06:35   Dg Abd Portable 1v  Result Date: 02/23/2016 CLINICAL DATA:  Rule out ileus. EXAM: PORTABLE ABDOMEN - 1 VIEW COMPARISON:  February 21, 2016 FINDINGS: Gastric distention. The large and small bowel are normal in caliber. No other abnormalities. IMPRESSION: Gastric distention. The large and small bowel are normal in caliber. Electronically Signed   By:  Dorise Bullion III M.D   On: 02/23/2016 08:56   Ct Renal Stone Study  Addendum Date: 02/24/2016   ADDENDUM REPORT: 02/24/2016 16:37 ADDENDUM: Dr. Gladstone Lighter called  to discuss this case. There is an outside MRI done 02/20/2016 at [facility] orthopedic center. Correlating the CT with this, there is concern for disc space infection at L3-4. This could be the etiology of the paravertebral edematous changes and medial soleus muscle changes. Electronically Signed   By: Nelson Chimes M.D.   On: 02/24/2016 16:37   Result Date: 02/24/2016 CLINICAL DATA:  Low back pain and hematuria EXAM: CT ABDOMEN AND PELVIS WITHOUT CONTRAST TECHNIQUE: Multidetector CT imaging of the abdomen and pelvis was performed following the standard protocol without IV contrast. COMPARISON:  02/17/2016 FINDINGS: Lower chest: No acute abnormality. Hepatobiliary: No focal liver abnormality is seen. Status post cholecystectomy. No biliary dilatation. Pancreas: Unremarkable. No pancreatic ductal dilatation or surrounding inflammatory changes. Spleen: Normal in size without focal abnormality. Adrenals/Urinary Tract: The adrenal glands are within normal limits. Mild perinephric stranding is noted bilaterally. No obstructive changes are seen. The bladder is decompressed by Foley catheter. A 4.7 cm cyst is noted within the midportion of the right kidney. Stomach/Bowel: The appendix is within normal limits. Diverticular changes noted throughout the colon without evidence of diverticulitis. Vascular/Lymphatic: Aortic atherosclerosis. Increased soft tissue density is noted along the anterior aspect of the lumbar spine along the anterior aspect of the L3-4 interspace. There is a focal solid component which measures approximately 2.6 cm consistent with lymphadenopathy. Reproductive: Prostate is unremarkable. Other: No abdominal wall hernia or abnormality. No abdominopelvic ascites. Musculoskeletal: Slight decreased attenuation is noted in the medial aspect of the psoas muscles bilaterally slightly worse on the right than the left. This may be related to muscular strain. No definitive fluid collection is seen. No acute bony  abnormality is noted. IMPRESSION: No renal calculi or obstructive changes are noted. A right renal cyst is noted. Bilateral perinephric stranding is seen of uncertain significance. Increased soft tissue density anterior to the lumbar spine as described with a focal solid component likely related to lymphadenopathy. Slight decreased attenuation within the medial aspect of the psoas muscles bilaterally. This may be related to muscular strain. Electronically Signed: By: Inez Catalina M.D. On: 02/21/2016 09:22    Microbiology: Recent Results (from the past 240 hour(s))  Blood Culture (routine x 2)     Status: Abnormal (Preliminary result)   Collection Time: 02/21/16  6:09 AM  Result Value Ref Range Status   Specimen Description BLOOD RIGHT ARM  Final   Special Requests BOTTLES DRAWN AEROBIC AND ANAEROBIC 5ML  Final   Culture  Setup Time   Final    GRAM POSITIVE COCCI IN CHAINS IN BOTH AEROBIC AND ANAEROBIC BOTTLES CRITICAL RESULT CALLED TO, READ BACK BY AND VERIFIED WITH: E MARTIN,PHARMD AT 1600 02/22/16 BY J FUDESCO    Culture (A)  Final    STREPTOCOCCUS INTERMEDIUS SUSCEPTIBILITIES PERFORMED ON PREVIOUS CULTURE WITHIN THE LAST 5 DAYS.    Report Status PENDING  Incomplete  Blood Culture ID Panel (Reflexed)     Status: Abnormal   Collection Time: 02/21/16  6:09 AM  Result Value Ref Range Status   Enterococcus species NOT DETECTED NOT DETECTED Final   Listeria monocytogenes NOT DETECTED NOT DETECTED Final   Staphylococcus species NOT DETECTED NOT DETECTED Final   Staphylococcus aureus NOT DETECTED NOT DETECTED Final   Streptococcus species DETECTED (A) NOT DETECTED Final    Comment: Not Enterococcus species,  Streptococcus agalactiae, Streptococcus pyogenes, or Streptococcus pneumoniae. CRITICAL RESULT CALLED TO, READ BACK BY AND VERIFIED WITH: E. MARTIN, 02/22/16 AT 1600 BY J FUDESCO    Streptococcus agalactiae NOT DETECTED NOT DETECTED Final   Streptococcus pneumoniae NOT DETECTED NOT  DETECTED Final   Streptococcus pyogenes NOT DETECTED NOT DETECTED Final   Acinetobacter baumannii NOT DETECTED NOT DETECTED Final   Enterobacteriaceae species NOT DETECTED NOT DETECTED Final   Enterobacter cloacae complex NOT DETECTED NOT DETECTED Final   Escherichia coli NOT DETECTED NOT DETECTED Final   Klebsiella oxytoca NOT DETECTED NOT DETECTED Final   Klebsiella pneumoniae NOT DETECTED NOT DETECTED Final   Proteus species NOT DETECTED NOT DETECTED Final   Serratia marcescens NOT DETECTED NOT DETECTED Final   Haemophilus influenzae NOT DETECTED NOT DETECTED Final   Neisseria meningitidis NOT DETECTED NOT DETECTED Final   Pseudomonas aeruginosa NOT DETECTED NOT DETECTED Final   Candida albicans NOT DETECTED NOT DETECTED Final   Candida glabrata NOT DETECTED NOT DETECTED Final   Candida krusei NOT DETECTED NOT DETECTED Final   Candida parapsilosis NOT DETECTED NOT DETECTED Final   Candida tropicalis NOT DETECTED NOT DETECTED Final  Blood Culture (routine x 2)     Status: Abnormal   Collection Time: 02/21/16  6:17 AM  Result Value Ref Range Status   Specimen Description BLOOD LEFT ARM  Final   Special Requests BOTTLES DRAWN AEROBIC AND ANAEROBIC 5ML  Final   Culture  Setup Time   Final    GRAM POSITIVE COCCI IN CHAINS ANAEROBIC BOTTLE ONLY CRITICAL VALUE NOTED.  VALUE IS CONSISTENT WITH PREVIOUSLY REPORTED AND CALLED VALUE.    Culture STREPTOCOCCUS INTERMEDIUS (A)  Final   Report Status 02/25/2016 FINAL  Final   Organism ID, Bacteria STREPTOCOCCUS INTERMEDIUS  Final      Susceptibility   Streptococcus intermedius - MIC*    PENICILLIN <=0.06 SENSITIVE Sensitive     CEFTRIAXONE 0.25 SENSITIVE Sensitive     ERYTHROMYCIN <=0.12 SENSITIVE Sensitive     LEVOFLOXACIN 0.5 SENSITIVE Sensitive     VANCOMYCIN 0.5 SENSITIVE Sensitive     * STREPTOCOCCUS INTERMEDIUS  Urine culture     Status: Abnormal   Collection Time: 02/21/16  6:26 AM  Result Value Ref Range Status   Specimen  Description URINE, RANDOM  Final   Special Requests NONE  Final   Culture <10,000 COLONIES/mL INSIGNIFICANT GROWTH (A)  Final   Report Status 02/22/2016 FINAL  Final  Respiratory Panel by PCR     Status: None   Collection Time: 02/21/16  1:20 PM  Result Value Ref Range Status   Adenovirus NOT DETECTED NOT DETECTED Final   Coronavirus 229E NOT DETECTED NOT DETECTED Final   Coronavirus HKU1 NOT DETECTED NOT DETECTED Final   Coronavirus NL63 NOT DETECTED NOT DETECTED Final   Coronavirus OC43 NOT DETECTED NOT DETECTED Final   Metapneumovirus NOT DETECTED NOT DETECTED Final   Rhinovirus / Enterovirus NOT DETECTED NOT DETECTED Final   Influenza A NOT DETECTED NOT DETECTED Final   Influenza B NOT DETECTED NOT DETECTED Final   Parainfluenza Virus 1 NOT DETECTED NOT DETECTED Final   Parainfluenza Virus 2 NOT DETECTED NOT DETECTED Final   Parainfluenza Virus 3 NOT DETECTED NOT DETECTED Final   Parainfluenza Virus 4 NOT DETECTED NOT DETECTED Final   Respiratory Syncytial Virus NOT DETECTED NOT DETECTED Final   Bordetella pertussis NOT DETECTED NOT DETECTED Final   Chlamydophila pneumoniae NOT DETECTED NOT DETECTED Final   Mycoplasma pneumoniae NOT DETECTED NOT  DETECTED Final  MRSA PCR Screening     Status: None   Collection Time: 02/21/16  8:43 PM  Result Value Ref Range Status   MRSA by PCR NEGATIVE NEGATIVE Final    Comment:        The GeneXpert MRSA Assay (FDA approved for NASAL specimens only), is one component of a comprehensive MRSA colonization surveillance program. It is not intended to diagnose MRSA infection nor to guide or monitor treatment for MRSA infections.   Stool culture (children & immunocomp patients)     Status: None   Collection Time: 02/22/16  9:31 PM  Result Value Ref Range Status   Salmonella/Shigella Screen Final report  Corrected    Comment: CORRECTED ON 02/07 AT 1636: PREVIOUSLY REPORTED AS Preliminary report   Campylobacter Culture Final report  Final   E  coli, Shiga toxin Assay Negative Negative Final    Comment: (NOTE) Performed At: [facility] [place], [place] [id number] Lindon Romp MD [id number]   C difficile quick scan w PCR reflex     Status: None   Collection Time: 02/22/16  9:31 PM  Result Value Ref Range Status   C Diff antigen NEGATIVE NEGATIVE Final   C Diff toxin NEGATIVE NEGATIVE Final   C Diff interpretation No C. difficile detected.  Final  STOOL CULTURE REFLEX - RSASHR     Status: None   Collection Time: 02/22/16  9:31 PM  Result Value Ref Range Status   Stool Culture result 1 (RSASHR) Comment  Final    Comment: (NOTE) No Salmonella or Shigella recovered. Performed At: [facility] [address] [place], [place] [id number] Lindon Romp MD [id number]   STOOL CULTURE Reflex - CMPCXR     Status: None   Collection Time: 02/22/16  9:31 PM  Result Value Ref Range Status   Stool Culture result 1 (CMPCXR) Comment  Final    Comment: (NOTE) No Campylobacter species isolated. Performed At: [facility] [place], [place] [id number] Lindon Romp MD [id number]   Culture, blood (Routine X 2) w Reflex to ID Panel     Status: None (Preliminary result)   Collection Time: 02/24/16  7:39 AM  Result Value Ref Range Status   Specimen Description BLOOD RIGHT HAND  Final   Special Requests BOTTLES DRAWN AEROBIC AND ANAEROBIC 5CC  Final   Culture NO GROWTH 3 DAYS  Final   Report Status PENDING  Incomplete  Culture, blood (routine x 2)     Status: None (Preliminary result)   Collection Time: 02/24/16  7:46 AM  Result Value Ref Range Status   Specimen Description BLOOD LEFT HAND  Final   Special Requests BOTTLES DRAWN AEROBIC AND ANAEROBIC 5CC  Final   Culture NO GROWTH 3 DAYS  Final   Report Status PENDING  Incomplete     Labs: Basic Metabolic Panel:  Recent Labs Lab 02/21/16 1836  02/22/16 0120 02/22/16 0723 02/23/16 0326 02/24/16 0139  02/25/16 0401  NA  --   < > 136 137 134* 133* 134*  K  --   < > 3.8 4.8 3.0* 3.3* 4.2  CL  --   < > 106 107 103 105 104  CO2  --   < > 21* 21* 20* 20* 19*  GLUCOSE  --   < > 106* 97 124* 129* 134*  BUN  --   < > 18 19 17 20 18   CREATININE  --   < >  0.83 1.08 0.87 0.82 0.85  CALCIUM  --   < > 8.2* 7.9* 8.0* 7.6* 7.8*  MG 1.8  --   --   --   --  2.4  --   PHOS 3.0  --   --   --   --   --   --   < > = values in this interval not displayed. Liver Function Tests:  Recent Labs Lab 02/23/16 0326 02/24/16 0139 02/25/16 0401  AST 35 60* 59*  ALT 28 49 70*  ALKPHOS 50 57 53  BILITOT 1.0 0.6 0.7  PROT 5.5* 5.4* 5.3*  ALBUMIN 2.2* 2.0* 2.1*    Recent Labs Lab 02/21/16 1836  LIPASE 16   No results for input(s): AMMONIA in the last 168 hours. CBC:  Recent Labs Lab 02/21/16 2118 02/22/16 1054  02/25/16 0401 02/26/16 0235 02/26/16 1625 02/27/16 0302 02/28/16 0548  WBC 9.7 10.5  < > 12.6* 11.4* 11.0* 9.8 9.5  NEUTROABS 7.5 8.4*  --   --   --  9.6*  --   --   HGB 12.2* 11.5*  < > 12.1* 11.6* 12.4* 12.2* 12.3*  HCT 35.2* 33.2*  < > 35.2* 34.6* 35.8* 35.4* 37.2*  MCV 92.6 92.2  < > 93.1 92.5 92.3 91.9 94.2  PLT 125* 130*  < > 207 239 256 276 315  < > = values in this interval not displayed. Cardiac Enzymes:  Recent Labs Lab 02/21/16 1836 02/21/16 2118 02/21/16 2332 02/22/16 0723  TROPONINI 0.06* 0.03* 0.08* <0.03   BNP: BNP (last 3 results) No results for input(s): BNP in the last 8760 hours.  ProBNP (last 3 results) No results for input(s): PROBNP in the last 8760 hours.  CBG: No results for input(s): GLUCAP in the last 168 hours.     Signed:  Dia Crawford, MD [facility] [phone] pager

## 2016-02-28 NOTE — Care Management Note (Signed)
Case Management Note  Patient Details  Name: Jonathan Holden MRN: [id number] Date of Birth: 1945-05-19  Subjective/Objective:  Patient will be Discharged to CIR today after TEE, he will also be on iv abx, NCM will cont to follow for dc needs.                   Action/Plan:   Expected Discharge Date:                  Expected Discharge Plan:  IP Rehab Facility  In-House Referral:  Clinical Social Work  Discharge planning Services  CM Consult  Post Acute Care Choice:    Choice offered to:     DME Arranged:    DME Agency:     HH Arranged:    [place] Agency:     Status of Service:  Completed, signed off  If discussed at [employer], dates discussed:    Additional Comments:  Zenon Mayo, RN 02/28/2016, 2:12 PM

## 2016-02-28 NOTE — Op Note (Signed)
Full report to follow in CV section of chart 

## 2016-02-28 NOTE — PMR Pre-admission (Signed)
PMR Admission Coordinator Pre-Admission Assessment  Patient: Jonathan Holden is an 71 y.o., male MRN: [id number] DOB: 04-21-1945 Height: 6\' 1"  (185.4 cm) Weight: 96.5 kg (212 lb 11.9 oz)              Insurance Information HMO:     PPO:      PCP:      IPA:      80/20: yes     OTHER:  No HMO PRIMARY: Medicare a and b      Policy#: [id number]      Subscriber: pt Benefits:  Phone #: passport one online     Name: 02/28/16 Eff. Date: 07/19/2009     Deduct: $1340      Out of Pocket Max: none      Life Max: none CIR: 100%      SNF: 20 full days Outpatient: 80%     Co-Pay: 20% Home Health: 100%      Co-Pay: none DME: 80%     Co-Pay: 20% Providers: pt choice  SECONDARY: AARP supplement      Policy#: [id number]      Subscriber: pt  Medicaid Application Date:       Case Manager:  Disability Application Date:       Case Worker:   Emergency Contact Information Contact Information    Name Relation Home Work Mobile   [place] Brother [phone]     Bettye Boeck Significant other [phone]       Current Medical History  Patient Admitting Diagnosis: discitis/sepsis  History of present illness:  HPI: Jonathan Holden a 71 y.o.right handed malewith history of hypertension, CAD with stenting, previous laminectomy at L4-5 and L5-S1.  Presented 02/21/2016 with low back pain and altered mental status. He was initially seen in the ED 02/17/2016 given a prescription of steroids and hydrocodone and sent home. A recent MRI as an outpatient showed herniated L3-4 and previous surgical changes. Patient gradually became more confused and returned back to the ED. Found to have A. fib with aVR. Alcohol and urine drug screens negative. Fever 102. WBC of 11,000. Lactic acid unremarkable. Troponin 0.06. Placed on broad-spectrum antibiotics for suspect sepsis. Neurosurgery has been consulted with workup ongoing with MRI reviewed that did not mention disc space infection however discitis was suspect L4-5,  L5-S1. Lumbar corset applied in sitting position when up with therapies. Infectious disease consulted pathogen consistent with streptococcal bacteremia and placed on high-dose penicillin 6 weeks. Cardiology consulted to address atrial fibrillation and currently on intravenous heparin and transitioned to Eliquis. Echocardiogram with ejection fraction 60% no wall motion abnormalities. TEE pending 02/28/16 to complete workup. Still with ongoing bouts of confusion but continues to improve .   Past Medical History  Past Medical History:  Diagnosis Date  . Arthritis HANDS AND RIGHT SHOULDER  . Coronary artery disease CARDIOLOGIST- DR Daneen Schick- LAST VISIT 6 MON AGO--  REQUESTED NOTE, EKG, ECHO, STRESS TEST   PT DENIES S & S  . H/O gastric ulcer   . History of MI (myocardial infarction) 1992  . Hyperlipidemia   . Hypertension   . Loose body of right shoulder   . S/P coronary angioplasty     Family History  family history includes Heart attack (age of onset: 42) in his father; Other in his brother and mother.  Prior Rehab/Hospitalizations:  Has the patient had major surgery during 100 days prior to admission? No  Current Medications   Current Facility-Administered Medications:  .  0.9 %  sodium chloride infusion, , Intravenous, Continuous, Eileen Stanford, PA-C .  acetaminophen (TYLENOL) tablet 650 mg, 650 mg, Oral, Q6H PRN, 650 mg at 02/26/16 1734 **OR** acetaminophen (TYLENOL) suppository 650 mg, 650 mg, Rectal, Q6H PRN, Elwin Mocha, MD, 650 mg at 02/22/16 0221 .  Alirocumab SOPN 150 mg, 150 mg, Subcutaneous, Q14 Days, Cherene Altes, MD, 150 mg at 02/24/16 1400 .  alum & mag hydroxide-simeth (MAALOX/MYLANTA) 200-200-20 MG/5ML suspension 15-30 mL, 15-30 mL, Oral, Q4H PRN, Cherene Altes, MD, 30 mL at 02/24/16 [id number] .  apixaban (ELIQUIS) tablet 5 mg, 5 mg, Oral, BID, Thayer Headings, MD, 5 mg at 02/28/16 0915 .  atorvastatin (LIPITOR) tablet 40 mg, 40 mg, Oral, Daily, Elwin Mocha, MD, 40 mg at 02/28/16 0915 .  diltiazem (CARDIZEM) tablet 90 mg, 90 mg, Oral, Q6H, Allie Bossier, MD, 90 mg at 02/28/16 0506 .  famotidine (PEPCID) tablet 20 mg, 20 mg, Oral, BID, Allie Bossier, MD, 20 mg at 02/28/16 0915 .  folic acid (FOLVITE) tablet 1 mg, 1 mg, Oral, Daily, Elwin Mocha, MD, 1 mg at 02/28/16 0915 .  gi cocktail (Maalox,Lidocaine,Donnatal), 30 mL, Oral, TID PRN, Cherene Altes, MD, 30 mL at 02/25/16 0326 .  haloperidol lactate (HALDOL) injection 1-2 mg, 1-2 mg, Intravenous, Q6H PRN, Cherene Altes, MD, 2 mg at 02/26/16 1858 .  hydrALAZINE (APRESOLINE) injection 10 mg, 10 mg, Intravenous, Q8H PRN, Elwin Mocha, MD, 10 mg at 02/26/16 1857 .  lidocaine (LIDODERM) 5 % 2 patch, 2 patch, Transdermal, Q24H, Elwin Mocha, MD, 2 patch at 02/27/16 2222 .  lisinopril (PRINIVIL,ZESTRIL) tablet 20 mg, 20 mg, Oral, Daily, Elwin Mocha, MD, 20 mg at 02/28/16 0915 .  loperamide (IMODIUM) capsule 2 mg, 2 mg, Oral, PRN, Cherene Altes, MD, 2 mg at 02/26/16 0836 .  MEDLINE mouth rinse, 15 mL, Mouth Rinse, BID, Elwin Mocha, MD, 15 mL at 02/28/16 1000 .  Melatonin TABS 9 mg, 9 mg, Oral, QHS PRN, Cherene Altes, MD, 9 mg at 02/27/16 2218 .  metoprolol (LOPRESSOR) injection 5 mg, 5 mg, Intravenous, Q8H PRN, Elwin Mocha, MD, 5 mg at 02/26/16 1855 .  metoprolol tartrate (LOPRESSOR) tablet 125 mg, 125 mg, Oral, BID, Allie Bossier, MD, 125 mg at 02/28/16 0915 .  multivitamin with minerals tablet 1 tablet, 1 tablet, Oral, Daily, Elwin Mocha, MD, 1 tablet at 02/28/16 0915 .  ondansetron (ZOFRAN) tablet 4 mg, 4 mg, Oral, Q6H PRN **OR** ondansetron (ZOFRAN) injection 4 mg, 4 mg, Intravenous, Q6H PRN, Elwin Mocha, MD, 4 mg at 02/25/16 0333 .  oxyCODONE-acetaminophen (PERCOCET/ROXICET) 5-325 MG per tablet 1 tablet, 1 tablet, Oral, Q4H PRN, Allie Bossier, MD, 1 tablet at 02/27/16 2317 .  pantoprazole (PROTONIX) EC tablet 40 mg, 40 mg, Oral, Q1200, Cherene Altes, MD, 40 mg at 02/27/16 1257 .  penicillin G potassium 4 Million Units in dextrose 5 % 250 mL IVPB, 4 Million Units, Intravenous, Q4H, Allie Bossier, MD, 4 Million Units at 02/28/16 0915 .  pneumococcal 23 valent vaccine (PNU-IMMUNE) injection 0.5 mL, 0.5 mL, Intramuscular, Tomorrow-1000, Cherene Altes, MD .  polyethylene glycol (MIRALAX / GLYCOLAX) packet 17 g, 17 g, Oral, Daily PRN, Elwin Mocha, MD .  prednisoLONE acetate (PRED FORTE) 1 % ophthalmic suspension 1 drop, 1 drop, Left Eye, TID, Cherene Altes, MD, 1 drop at 02/28/16 [phone] .  sodium chloride flush (NS) 0.9 % injection  10-40 mL, 10-40 mL, Intracatheter, Q12H, Allie Bossier, MD, 10 mL at 02/28/16 0916 .  sodium chloride flush (NS) 0.9 % injection 10-40 mL, 10-40 mL, Intracatheter, PRN, Allie Bossier, MD .  thiamine (VITAMIN B-1) tablet 100 mg, 100 mg, Oral, Daily, Elwin Mocha, MD, 100 mg at 02/28/16 0920  Patients Current Diet: Diet NPO time specified  Precautions / Restrictions Precautions Precautions: Fall Precaution Comments: Reinforced back precautions with mobility today Spinal Brace: Lumbar corset, Applied in sitting position Restrictions Weight Bearing Restrictions: No   Has the patient had 2 or more falls or a fall with injury in the past year?No  Prior Activity Level Community (5-7x/wk): active, independent and driving.  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  Independent  Indoor Mobility: Did the patient need assistance with walking  from room to room (with or without device)? Independent  Stairs: Did the patient need assistance with internal or external stairs (with or without device)? Independent  Functional Cognition: Did the patient need help planning regular tasks such as shopping or remembering to take medications? Independent  Current Functional Level Cognition  Overall Cognitive Status: Impaired/Different from baseline Current Attention Level: Selective Orientation Level: Oriented X4 Following Commands: Follows one step commands with increased time Safety/Judgement: Decreased awareness of safety, Decreased awareness of deficits General Comments: max cues needed for general safety with session today with increased time needed for pt to process information being provided. For instance pt let go of RW multiple times during session needing redirection to hold walker with gait.                            Extremity Assessment (includes Sensation/Coordination)  Upper Extremity Assessment: Defer to OT evaluation  Lower Extremity Assessment: Overall WFL for tasks assessed, Generalized weakness (limitations due to pain)    ADLs  Overall ADL's : Needs assistance/impaired Eating/Feeding: Set up, Sitting Grooming: Set up, Sitting Upper Body Bathing: Moderate assistance, Sitting Lower Body Bathing: Total assistance, Sit to/from stand Upper Body Dressing : Maximal assistance, Sitting Upper Body Dressing Details (indicate cue type and reason): began educating in donning back brace Lower Body Dressing: Total assistance, Sit to/from stand Toilet Transfer: Moderate assistance, RW, Ambulation Toileting- Clothing Manipulation and Hygiene: Total assistance, Sit to/from stand Functional mobility during ADLs: Moderate assistance, Rolling walker, Cueing for sequencing, Cueing for safety    Mobility  Overal bed mobility: Needs Assistance Bed Mobility: Rolling, Sidelying to Sit Rolling: Mod assist Sidelying to sit: Mod  assist General bed mobility comments: not observed- in bathroom with nursing on arrival and to recliner after session    Transfers  Overall transfer level: Needs assistance Equipment used: Rolling walker (2 wheeled) Transfers: Sit to/from Stand Sit to Stand: Max assist, From elevated surface, Min assist General transfer comment: max assist with cues for technique to stand from low toilet in bathroom using grab bar and door frame to pull up on (nursing educated to use 3n1 over toilet from now on to assit with this); min assist to sit down to bed  with cues on hand placement. min assist to stand from elevated toilet with cues on hand placement and anterior weight shifting to assist with standing. min assist to sit in recliner with cues on hand placement/safety.                                Ambulation / Gait / Stairs / Wheelchair Mobility  Ambulation/Gait Ambulation/Gait assistance: Museum/gallery curator (Feet): 15 Feet Assistive device: Rolling walker (2 wheeled) Gait Pattern/deviations: Step-through pattern, Decreased stride length, Narrow base of support, Decreased step length - right, Decreased step length - left General Gait Details: overall unsteady gait pattern with heavey reliance on UE's on RW. pt with couple instances of feet crossing over/scissoring with gait during turns and/or furniture negotiation. cues needed on posture, step length, walker position and safety with gait.                             Gait velocity: decreased Gait velocity interpretation: Below normal speed for age/gender    Posture / Balance Dynamic Sitting Balance Sitting balance - Comments: min guard assist, needing intermittent assist of UEs Balance Overall balance assessment: Needs assistance Sitting balance-Leahy Scale: Poor Sitting balance - Comments: min guard assist, needing intermittent assist of UEs Standing balance support: No upper extremity supported, During functional activity Standing  balance-Leahy Scale: Poor Standing balance comment: at sink to wash hands and face, had hips/trunk propped on counter top in order to leg go with UE's min assist for balance. cues to adhere to back precautions with mobility.    Special needs/care consideration BiPAP/CPAP  N/a CPM  N/a Continuous Drip IV  N/a Dialysis  N/a Life Vest  N/a Oxygen  N/a Special Bed  N/a  Trach Size  N/a Wound Vac (area)  N/a Skin bilateral arm ecchymosis                         Bowel mgmt: continent, diarrhea, 2/8, negative cdiff Bladder mgmt: continent Diabetic mgmt  N/a Impaired cognition ; impulsive IV antibxs at d/c for 6 weeks, PCN, has PICC   Previous Home Environment Living Arrangements: Spouse/significant other (girlfriend, Social research officer, government)  Lives With: Significant other Available Help at Discharge: Available 24 hours/day Type of Home: House Home Layout: Two level, Able to live on main level with bedroom/bathroom Home Access: Stairs to enter Entrance Stairs-Rails: None Entrance Stairs-Number of Steps: 1+1 Bathroom Shower/Tub: Multimedia programmer: Standard Bathroom Accessibility: Yes How Accessible: Accessible via walker Home Care Services: No  Discharge Living Setting Plans for Discharge Living Setting: Patient's home, Lives with (comment) (girlfriend, Mollie) Type of Home at Discharge: House Discharge Home Layout: Two level, Able to live on main level with bedroom/bathroom Discharge Home Access: Stairs to enter Entrance Stairs-Rails: None Entrance Stairs-Number of Steps: 1 + 1 Discharge Bathroom Shower/Tub: Horticulturist, commercial: Standard Discharge Bathroom Accessibility: Yes How Accessible: Accessible via walker Does the patient have any problems obtaining your medications?: No  Social/Family/Support Systems Patient Roles: Partner, Other (Comment) (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 , Linna Hoff, ) Sport and exercise psychologist Information: Linna Hoff and [employer] Anticipated Caregiver:  Mollie Anticipated Ambulance person Information: see above Ability/Limitations of Caregiver: Linna Hoff works, Social research officer, government can work from home Caregiver Availability: 24/7 Discharge Plan Discussed with Primary Caregiver: Yes Is Caregiver In Agreement with Plan?: Yes Does Caregiver/Family have Issues with Lodging/Transportation while Pt  is in Rehab?: No  Goals/Additional Needs Patient/Family Goal for Rehab: supervision with PT, OT, and SLP Expected length of stay: [facility]OS 14- 17 days Special Service Needs: Linna Hoff has requested no visitors besides he and Mollie due to 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 Will admit to inpatient rehab today.  Preadmission Screen Completed By:  Cleatrice Burke, 02/28/2016 11:06 AM ______________________________________________________________________   Discussed status with Dr. Naaman Plummer on 02/28/16 at  1117 and received telephone approval for admission today.  Admission Coordinator:  Cleatrice Burke, time [id number] Date 02/28/16

## 2016-02-28 NOTE — Care Management Important Message (Signed)
Important Message  Patient Details  Name: NAHSIR IULIANO MRN: [id number] Date of Birth: 03/11/1945   Medicare Important Message Given:  Yes    Ceclia Koker Abena 02/28/2016, 2:02 PM

## 2016-02-28 NOTE — Progress Notes (Signed)
I met with pt and his brother at bedside to discuss his options for rehab venue. Pt and brother prefer an inpt rehab admission. He will have 24/7 care from his girlfriend at d/c. Noted for TEE at 2 pm today. I will verify bed availability and let team know today.[phone]

## 2016-02-28 NOTE — Progress Notes (Signed)
Cristina Gong, RN Rehab Admission Coordinator Signed Physical Medicine and Rehabilitation  PMR Pre-admission Date of Service: 02/28/2016 11:06 AM  Related encounter: ED to Hosp-Admission (Current) from 02/21/2016 in [facility] 4E CV SURGICAL PROGRESSIVE CARE       [] Hide copied text PMR Admission Coordinator Pre-Admission Assessment  Patient: Jonathan Holden is an 71 y.o., male MRN: [id number] DOB: 12/16/1945 Height: 6\' 1"  (185.4 cm) Weight: 96.5 kg (212 lb 11.9 oz)                                                                                                                                                  Insurance Information HMO:     PPO:      PCP:      IPA:      80/20: yes     OTHER:  No HMO PRIMARY: Medicare a and b      Policy#: [id number]      Subscriber: pt Benefits:  Phone #: passport one online     Name: 02/28/16 Eff. Date: 07/19/2009     Deduct: $1340      Out of Pocket Max: none      Life Max: none CIR: 100%      SNF: 20 full days Outpatient: 80%     Co-Pay: 20% Home Health: 100%      Co-Pay: none DME: 80%     Co-Pay: 20% Providers: pt choice  SECONDARY: AARP supplement      Policy#: [id number]      Subscriber: pt  Medicaid Application Date:       Case Manager:  Disability Application Date:       Case Worker:   Emergency Contact Information        Contact Information    Name Relation Home Work Mobile   [place] Brother [phone]     Bettye Boeck Significant other [phone]       Current Medical History  Patient Admitting Diagnosis: discitis/sepsis  History of present illness:  HPI: Jonathan Holden a 71 y.o.right handed malewith history of hypertension, CAD with stenting, previous laminectomy at L4-5 and L5-S1.  Presented 02/21/2016 with low back pain and altered mental status. He was initially seen in the ED 02/17/2016 given a prescription of steroids and hydrocodone and sent home. A recent MRI as an outpatient showed herniated L3-4 and  previous surgical changes. Patient gradually became more confused and returned back to the ED. Found to have A. fib with aVR. Alcohol and urine drug screens negative. Fever 102. WBC of 11,000. Lactic acid unremarkable. Troponin 0.06. Placed on broad-spectrum antibiotics for suspect sepsis. Neurosurgery has been consulted with workup ongoing with MRI reviewed that did not mention disc space infection however discitis was suspect L4-5, L5-S1.Lumbar corset applied in sitting position when up with therapies. Infectious disease consulted pathogen consistent with streptococcal bacteremia and placed  on high-dose penicillin 6 weeks.Cardiology consulted to address atrial fibrillation and currently on intravenous heparin and transitioned to Eliquis. Echocardiogram with ejection fraction 60% no wall motion abnormalities. TEE pending 02/28/16 to complete workup. Still with ongoing bouts of confusion but continues to improve .   Past Medical History      Past Medical History:  Diagnosis Date  . Arthritis HANDS AND RIGHT SHOULDER  . Coronary artery disease CARDIOLOGIST- DR Daneen Schick- LAST VISIT 6 MON AGO--  REQUESTED NOTE, EKG, ECHO, STRESS TEST   PT DENIES S & S  . H/O gastric ulcer   . History of MI (myocardial infarction) 1992  . Hyperlipidemia   . Hypertension   . Loose body of right shoulder   . S/P coronary angioplasty     Family History  family history includes Heart attack (age of onset: 19) in his father; Other in his brother and mother.  Prior Rehab/Hospitalizations:  Has the patient had major surgery during 100 days prior to admission? No  Current Medications   Current Facility-Administered Medications:  .  0.9 %  sodium chloride infusion, , Intravenous, Continuous, Eileen Stanford, PA-C .  acetaminophen (TYLENOL) tablet 650 mg, 650 mg, Oral, Q6H PRN, 650 mg at 02/26/16 1734 **OR** acetaminophen (TYLENOL) suppository 650 mg, 650 mg, Rectal, Q6H PRN, Elwin Mocha, MD, 650  mg at 02/22/16 0221 .  Alirocumab SOPN 150 mg, 150 mg, Subcutaneous, Q14 Days, Cherene Altes, MD, 150 mg at 02/24/16 1400 .  alum & mag hydroxide-simeth (MAALOX/MYLANTA) 200-200-20 MG/5ML suspension 15-30 mL, 15-30 mL, Oral, Q4H PRN, Cherene Altes, MD, 30 mL at 02/24/16 [id number] .  apixaban (ELIQUIS) tablet 5 mg, 5 mg, Oral, BID, Thayer Headings, MD, 5 mg at 02/28/16 0915 .  atorvastatin (LIPITOR) tablet 40 mg, 40 mg, Oral, Daily, Elwin Mocha, MD, 40 mg at 02/28/16 0915 .  diltiazem (CARDIZEM) tablet 90 mg, 90 mg, Oral, Q6H, Allie Bossier, MD, 90 mg at 02/28/16 0506 .  famotidine (PEPCID) tablet 20 mg, 20 mg, Oral, BID, Allie Bossier, MD, 20 mg at 02/28/16 0915 .  folic acid (FOLVITE) tablet 1 mg, 1 mg, Oral, Daily, Elwin Mocha, MD, 1 mg at 02/28/16 0915 .  gi cocktail (Maalox,Lidocaine,Donnatal), 30 mL, Oral, TID PRN, Cherene Altes, MD, 30 mL at 02/25/16 0326 .  haloperidol lactate (HALDOL) injection 1-2 mg, 1-2 mg, Intravenous, Q6H PRN, Cherene Altes, MD, 2 mg at 02/26/16 1858 .  hydrALAZINE (APRESOLINE) injection 10 mg, 10 mg, Intravenous, Q8H PRN, Elwin Mocha, MD, 10 mg at 02/26/16 1857 .  lidocaine (LIDODERM) 5 % 2 patch, 2 patch, Transdermal, Q24H, Elwin Mocha, MD, 2 patch at 02/27/16 2222 .  lisinopril (PRINIVIL,ZESTRIL) tablet 20 mg, 20 mg, Oral, Daily, Elwin Mocha, MD, 20 mg at 02/28/16 0915 .  loperamide (IMODIUM) capsule 2 mg, 2 mg, Oral, PRN, Cherene Altes, MD, 2 mg at 02/26/16 0836 .  MEDLINE mouth rinse, 15 mL, Mouth Rinse, BID, Elwin Mocha, MD, 15 mL at 02/28/16 1000 .  Melatonin TABS 9 mg, 9 mg, Oral, QHS PRN, Cherene Altes, MD, 9 mg at 02/27/16 2218 .  metoprolol (LOPRESSOR) injection 5 mg, 5 mg, Intravenous, Q8H PRN, Elwin Mocha, MD, 5 mg at 02/26/16 1855 .  metoprolol tartrate (LOPRESSOR) tablet 125 mg, 125 mg, Oral, BID, Allie Bossier, MD, 125 mg at 02/28/16 0915 .  multivitamin with minerals tablet 1 tablet, 1 tablet, Oral,  Daily, Doren Custard  Carvel Getting, MD, 1 tablet at 02/28/16 0915 .  ondansetron (ZOFRAN) tablet 4 mg, 4 mg, Oral, Q6H PRN **OR** ondansetron (ZOFRAN) injection 4 mg, 4 mg, Intravenous, Q6H PRN, Elwin Mocha, MD, 4 mg at 02/25/16 0333 .  oxyCODONE-acetaminophen (PERCOCET/ROXICET) 5-325 MG per tablet 1 tablet, 1 tablet, Oral, Q4H PRN, Allie Bossier, MD, 1 tablet at 02/27/16 2317 .  pantoprazole (PROTONIX) EC tablet 40 mg, 40 mg, Oral, Q1200, Cherene Altes, MD, 40 mg at 02/27/16 1257 .  penicillin G potassium 4 Million Units in dextrose 5 % 250 mL IVPB, 4 Million Units, Intravenous, Q4H, Allie Bossier, MD, 4 Million Units at 02/28/16 0915 .  pneumococcal 23 valent vaccine (PNU-IMMUNE) injection 0.5 mL, 0.5 mL, Intramuscular, Tomorrow-1000, Cherene Altes, MD .  polyethylene glycol (MIRALAX / GLYCOLAX) packet 17 g, 17 g, Oral, Daily PRN, Elwin Mocha, MD .  prednisoLONE acetate (PRED FORTE) 1 % ophthalmic suspension 1 drop, 1 drop, Left Eye, TID, Cherene Altes, MD, 1 drop at 02/28/16 [phone] .  sodium chloride flush (NS) 0.9 % injection 10-40 mL, 10-40 mL, Intracatheter, Q12H, Allie Bossier, MD, 10 mL at 02/28/16 0916 .  sodium chloride flush (NS) 0.9 % injection 10-40 mL, 10-40 mL, Intracatheter, PRN, Allie Bossier, MD .  thiamine (VITAMIN B-1) tablet 100 mg, 100 mg, Oral, Daily, Elwin Mocha, MD, 100 mg at 02/28/16 0920  Patients Current Diet: Diet NPO time specified  Precautions / Restrictions Precautions Precautions: Fall Precaution Comments: Reinforced back precautions with mobility today Spinal Brace: Lumbar corset, Applied in sitting position Restrictions Weight Bearing Restrictions: No   Has the patient had 2 or more falls or a fall with injury in the past year?No  Prior Activity Level Community (5-7x/wk): active, independent and driving.  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  Independent  Indoor Mobility: Did the patient need assistance with walking from room to room (with or without device)? Independent  Stairs: Did the patient need assistance with internal or external stairs (with or without device)? Independent  Functional Cognition: Did the patient need help planning regular tasks such as shopping or remembering to take medications? Independent  Current Functional Level Cognition  Overall Cognitive Status: Impaired/Different from baseline Current Attention Level: Selective Orientation Level: Oriented X4 Following Commands: Follows one step commands with increased time Safety/Judgement: Decreased awareness of safety, Decreased awareness of deficits General Comments: max cues needed for general safety with session today with increased time needed for pt to process information being provided. For instance pt let go of RW multiple times during session needing redirection to hold walker with gait.                            Extremity Assessment (includes Sensation/Coordination)  Upper Extremity Assessment: Defer to OT evaluation  Lower Extremity Assessment: Overall WFL for tasks assessed, Generalized weakness (limitations due to pain)    ADLs  Overall ADL's : Needs assistance/impaired Eating/Feeding: Set up, Sitting Grooming: Set up, Sitting Upper Body Bathing: Moderate assistance, Sitting  Lower Body Bathing: Total assistance, Sit to/from stand Upper Body Dressing : Maximal assistance, Sitting Upper Body Dressing Details (indicate cue type and reason): began educating in donning  back brace Lower Body Dressing: Total assistance, Sit to/from stand Toilet Transfer: Moderate assistance, RW, Ambulation Toileting- Clothing Manipulation and Hygiene: Total assistance, Sit to/from stand Functional mobility during ADLs: Moderate assistance, Rolling walker, Cueing for sequencing, Cueing for safety    Mobility  Overal bed mobility: Needs Assistance Bed Mobility: Rolling, Sidelying to Sit Rolling: Mod assist Sidelying to sit: Mod assist General bed mobility comments: not observed- in bathroom with nursing on arrival and to recliner after session    Transfers  Overall transfer level: Needs assistance Equipment used: Rolling walker (2 wheeled) Transfers: Sit to/from Stand Sit to Stand: Max assist, From elevated surface, Min assist General transfer comment: max assist with cues for technique to stand from low toilet in bathroom using grab bar and door frame to pull up on (nursing educated to use 3n1 over toilet from now on to assit with this); min assist to sit down to bed with cues on hand placement. min assist to stand from elevated toilet with cues on hand placement and anterior weight shifting to assist with standing. min assist to sit in recliner with cues on hand placement/safety.                                Ambulation / Gait / Stairs / Wheelchair Mobility  Ambulation/Gait Ambulation/Gait assistance: Museum/gallery curator (Feet): 15 Feet Assistive device: Rolling walker (2 wheeled) Gait Pattern/deviations: Step-through pattern, Decreased stride length, Narrow base of support, Decreased step length - right, Decreased step length - left General Gait Details: overall unsteady gait pattern with heavey reliance on UE's on RW. pt with couple instances of feet crossing over/scissoring with gait during turns and/or furniture negotiation. cues needed on posture, step length, walker position and safety with gait.                             Gait velocity:  decreased Gait velocity interpretation: Below normal speed for age/gender    Posture / Balance Dynamic Sitting Balance Sitting balance - Comments: min guard assist, needing intermittent assist of UEs Balance Overall balance assessment: Needs assistance Sitting balance-Leahy Scale: Poor Sitting balance - Comments: min guard assist, needing intermittent assist of UEs Standing balance support: No upper extremity supported, During functional activity Standing balance-Leahy Scale: Poor Standing balance comment: at sink to wash hands and face, had hips/trunk propped on counter top in order to leg go with UE's min assist for balance. cues to adhere to back precautions with mobility.    Special needs/care consideration BiPAP/CPAP  N/a CPM  N/a Continuous Drip IV  N/a Dialysis  N/a Life Vest  N/a Oxygen  N/a Special Bed  N/a  Trach Size  N/a Wound Vac (area)  N/a Skin bilateral arm ecchymosis                         Bowel mgmt: continent, diarrhea, 2/8, negative cdiff Bladder mgmt: continent Diabetic mgmt  N/a Impaired cognition ; impulsive IV antibxs at d/c for 6 weeks, PCN, has PICC   Previous Home Environment Living Arrangements: Spouse/significant other (girlfriend, Jonathan Holden)  Lives With: Significant other Available Help at Discharge: Available 24 hours/day Type of Home: House Home Layout: Two  level, Able to live on main level with bedroom/bathroom Home Access: Stairs to enter Entrance Stairs-Rails: None Entrance Stairs-Number of Steps: 1+1 Bathroom Shower/Tub: Multimedia programmer: Standard Bathroom Accessibility: Yes How Accessible: Accessible via walker [place]: No  Discharge Living Setting Plans for Discharge Living Setting: Patient's home, Lives with (comment) (girlfriend, Jonathan Holden) Type of Home at Discharge: House Discharge Home Layout: Two level, Able to live on main level with bedroom/bathroom Discharge Home Access: Stairs to enter Entrance  Stairs-Rails: None Entrance Stairs-Number of Steps: 1 + 1 Discharge Bathroom Shower/Tub: Horticulturist, commercial: Standard Discharge Bathroom Accessibility: Yes How Accessible: Accessible via walker Does the patient have any problems obtaining your medications?: No  Social/Family/Support Systems Patient Roles: Partner, Other (Comment) (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 , Jonathan Holden, ) Sport and exercise psychologist Information: Jonathan Holden and [employer] Anticipated Caregiver: Water quality scientist Information: see above Ability/Limitations of Caregiver: Jonathan Holden works, Social research officer, government can work from home Caregiver Availability: 24/7 Discharge Plan Discussed with Primary Caregiver: Yes Is Caregiver In Agreement with Plan?: Yes Does Caregiver/Family have Issues with Lodging/Transportation while Pt is in Rehab?: No  Goals/Additional Needs Patient/Family Goal for Rehab: supervision with PT, OT, and SLP Expected length of stay: [facility] 14- 17 days Special Service Needs: Jonathan Holden has requested no visitors besides he and [place] due to 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 Will admit to inpatient rehab today.  Preadmission Screen Completed By:  Cleatrice Burke, 02/28/2016 11:06 AM ______________________________________________________________________   Discussed status with Dr. Naaman Plummer on 02/28/16 at  1117 and received telephone approval for admission  today.  Admission Coordinator:  Cleatrice Burke, time [id number] Date 02/28/16       Cosigned by: Meredith Staggers, MD at 02/28/2016 11:21 AM  Revision History

## 2016-02-28 NOTE — H&P (Signed)
Physical Medicine and Rehabilitation Admission H&P       Chief Complaint  Patient presents with  . Back Pain  : HPI: Jonathan Stahlecker Linkeris a 71 y.o.right handed malewith history of hypertension, CAD with stenting, previous laminectomy at L4-5 and L5-S1. Per chart review patient lives with girlfriend. 2 level home with bedroom on main level. Reported to be independent prior to admission. Presented 02/21/2016 with low back pain and altered mental status. He was initially seen in the ED 02/17/2016 given a prescription of steroids and hydrocodone and sent home. A recent MRI as an outpatient showed herniated L3-4 and previous surgical changes. Patient gradually became more confused and returned back to the ED. Found to have A. fib with aVR. Alcohol and urine drug screens negative. Fever 102. WBC of 11,000. Lactic acid unremarkable. Troponin 0.06. Placed on broad-spectrum antibiotics for suspect sepsis. Neurosurgery has been consulted with workup ongoing with MRI reviewed that did not mention disc space infection however discitis was suspect L4-5, L5-S1. Lumbar corset applied in sitting position when up with therapies. Infectious disease consulted pathogen consistent with streptococcal bacteremia and placed on high-dose penicillin 6 weeks. Cardiology consulted to address atrial fibrillation and currently on intravenous heparin and transitioned to Eliquis. Echocardiogram with ejection fraction 60% no wall motion abnormalities. TEE pending. Still with ongoing bouts of confusion but continues to improve . Physical and Occupational therapy evaluations completed with recommendations of physical medicine rehabilitation consult. Patient was admitted for a comprehensive rehabilitation program  Review of Systems  Constitutional: Negative for fever.  HENT: Negative for hearing loss and tinnitus.   Eyes: Negative for blurred vision and double vision.  Respiratory: Negative for cough and shortness of  breath.   Cardiovascular: Positive for palpitations and leg swelling. Negative for chest pain.  Gastrointestinal: Positive for constipation. Negative for nausea and vomiting.  Genitourinary: Positive for urgency. Negative for hematuria.  Musculoskeletal: Positive for joint pain and myalgias.  Skin: Negative for rash.  Neurological: Positive for weakness. Negative for seizures and loss of consciousness.  Psychiatric/Behavioral: The patient has insomnia.   All other systems reviewed and are negative.      Past Medical History:  Diagnosis Date  . Arthritis HANDS AND RIGHT SHOULDER  . Coronary artery disease CARDIOLOGIST- DR Daneen Schick- LAST VISIT 6 MON AGO--  REQUESTED NOTE, EKG, ECHO, STRESS TEST   PT DENIES S & S  . H/O gastric ulcer   . History of MI (myocardial infarction) 1992  . Hyperlipidemia   . Hypertension   . Loose body of right shoulder   . S/P coronary angioplasty         Past Surgical History:  Procedure Laterality Date  . CARDIOVASCULAR STRESS TEST  08-22-2008---  DR Daneen Schick   NO ISCHEMIA.   Marland Kitchen CORONARY ANGIOPLASTY  1992  . ERCP W/ SPHICTEROTOMY  12-15-2002   AND STONE EXTRACTION  . LAPAROSCOPIC CHOLECYSTECTOMY  12-16-2002  . LUMBAR LAMINECTOMY  20 YRS AGO   L5         Family History  Problem Relation Age of Onset  . Other Mother     AGE 23 HEALTHY  . Heart attack Father 57    2 MI  . Emphysema    . Other Brother     HEALTHY   Social History:  reports that he quit smoking about 25 years ago. His smoking use included Cigarettes. He has a 40.00 pack-year smoking history. He has never used smokeless tobacco. He reports that he  drinks about 4.2 oz of alcohol per week . He reports that he does not use drugs. Allergies:       Allergies  Allergen Reactions  . Other Other (See Comments)    Pt reports allergic reaction to melons - unknown reaction  . Ciprofloxacin Hives         Medications Prior to Admission  Medication  Sig Dispense Refill  . Alirocumab (PRALUENT) 150 MG/ML SOPN Inject 150 mg into the skin every 14 (fourteen) days. 2 pen 11  . atorvastatin (LIPITOR) 80 MG tablet Take 40 mg by mouth daily.    . Coenzyme Q10 (COQ10 PO) Take 1 capsule by mouth daily.    . cyclobenzaprine (FLEXERIL) 10 MG tablet Take 10 mg by mouth 2 (two) times daily.     Marland Kitchen GLUCOSAMINE-CHONDROITIN PO Take 2 tablets by mouth daily.    Marland Kitchen lisinopril-hydrochlorothiazide (PRINZIDE,ZESTORETIC) 20-12.5 MG tablet Take 1 tablet by mouth daily. Patient is overdue for an appointment. Please call and schedule for further refills 90 tablet 0  . Melatonin 10 MG TABS Take 10-20 mg by mouth at bedtime as needed (sleep).     . methylPREDNISolone (MEDROL DOSEPAK) 4 MG TBPK tablet Take as directed on packet (Patient taking differently: Take 4 mg by mouth See admin instructions. Take as directed on package - tapered course) 21 tablet 0  . Multiple Vitamin (MULTIVITAMIN WITH MINERALS) TABS tablet Take 1 tablet by mouth daily. Centrum    . mupirocin ointment (BACTROBAN) 2 % Apply 1 application topically 2 (two) times daily as needed (wound care/ eczema).    . naloxegol oxalate (MOVANTIK) 25 MG TABS tablet Take 25 mg by mouth 2 (two) times daily.    . naproxen sodium (ALEVE) 220 MG tablet Take 660 mg by mouth daily as needed (pain).    . Omega-3 Fatty Acids (FISH OIL) 1000 MG CAPS Take 1,000 mg by mouth daily.    Marland Kitchen oxyCODONE-acetaminophen (PERCOCET) 10-325 MG tablet Take 1 tablet by mouth every 4 (four) hours as needed for pain.     . prednisoLONE acetate (PRED FORTE) 1 % ophthalmic suspension Place 1 drop into the left eye 3 (three) times daily.    Marland Kitchen HYDROcodone-acetaminophen (NORCO/VICODIN) 5-325 MG tablet Take 1-2 tablets by mouth every 6 (six) hours as needed. (Patient not taking: Reported on 02/21/2016) 10 tablet 0  . metoprolol succinate (TOPROL XL) 25 MG 24 hr tablet Take 1 tablet (25 mg total) by mouth daily. (Patient not  taking: Reported on 02/21/2016) 90 tablet 1    Home: [place] expects to be discharged to:: Private residence Living Arrangements: Spouse/significant other (girlfriend) Available Help at Discharge: Available 24 hours/day (girlfriend) Type of Home: House Home Access: Stairs to enter Technical brewer of Steps: 1+1 Entrance Stairs-Rails: None Home Layout: Two level, Able to live on main level with bedroom/bathroom Bathroom Shower/Tub: Multimedia programmer: Standard Home Equipment: None   Functional History: Prior Function Level of Independence: Independent  Functional Status:  Mobility: Bed Mobility Overal bed mobility: Needs Assistance Bed Mobility: Rolling, Sidelying to Sit Rolling: Mod assist Sidelying to sit: Mod assist General bed mobility comments: instructed in log roll technique Transfers Overall transfer level: Needs assistance Equipment used: Rolling walker (2 wheeled) Transfers: Sit to/from Stand Sit to Stand: Mod assist, From elevated surface General transfer comment: assist to rise and steady, increased time, cues for hand placement Ambulation/Gait Ambulation/Gait assistance: Min assist, Mod assist Ambulation Distance (Feet): 70 Feet Assistive device: Rolling walker (2 wheeled) Gait  Pattern/deviations: Step-through pattern General Gait Details: Weak-kneed gait with heavy use of the RW. Gait velocity: slow Gait velocity interpretation: Below normal speed for age/gender  ADL: ADL Overall ADL's : Needs assistance/impaired Eating/Feeding: Set up, Sitting Grooming: Set up, Sitting Upper Body Bathing: Moderate assistance, Sitting Lower Body Bathing: Total assistance, Sit to/from stand Upper Body Dressing : Maximal assistance, Sitting Upper Body Dressing Details (indicate cue type and reason): began educating in donning back brace Lower Body Dressing: Total assistance, Sit to/from stand Toilet Transfer: Moderate assistance,  RW, Ambulation Toileting- Clothing Manipulation and Hygiene: Total assistance, Sit to/from stand Functional mobility during ADLs: Moderate assistance, Rolling walker, Cueing for sequencing, Cueing for safety  Cognition: Cognition Overall Cognitive Status: Impaired/Different from baseline Orientation Level: Oriented X4 Cognition Arousal/Alertness: Awake/alert Behavior During Therapy: WFL for tasks assessed/performed Overall Cognitive Status: Impaired/Different from baseline Area of Impairment: Orientation, Attention, Memory, Safety/judgement, Problem solving, Following commands Orientation Level: Disoriented to, Time Current Attention Level: Selective Memory: Decreased short-term memory Following Commands: Follows one step commands with increased time Safety/Judgement: Decreased awareness of safety, Decreased awareness of deficits Problem Solving: Slow processing, Difficulty sequencing, Requires verbal cues, Requires tactile cues  Physical Exam: Blood pressure 137/79, pulse (!) 103, temperature 98.4 F (36.9 C), temperature source Oral, resp. rate 18, height 6\' 1"  (1.854 m), weight 96.5 kg (212 lb 11.9 oz), SpO2 98 %. Physical Exam  Vitals reviewed. Constitutional: He appears well-developed. No distress.  HENT:  Head: Normocephalic and atraumatic.  Eyes: EOM are normal. Right eye exhibits no discharge. Left eye exhibits no discharge.  Neck: Normal range of motion. Neck supple. No tracheal deviation present. No thyromegaly present.  Cardiovascular: Exam reveals no friction rub.   No murmur heard. Cardiac rate controlled  Respiratory: Effort normal and breath sounds normal. No respiratory distress. He has no wheezes. He has no rales. He exhibits no tenderness.  GI: Soft. Bowel sounds are normal. He exhibits no distension. There is no tenderness.  Musculoskeletal:  Wearing LSO, back tender with truncal movement and to palpation.   Skin: Skin is warm. He is not diaphoretic.    Psychiatric:  Cooperative. A bit impulsive  Skin.Warm and dry Neurological: He is alert.  He is oriented to person, date of birth in place. Patient more appropriate. Had recall of events from this morning. Aware that he's coming to rehab today.  Motor strength is 5/5 bilateral deltoid, biceps, tricesp, wrist, hand intrinsics 4-/5 bilateral hip flexors, knee extensors, ankle dorsiflexor is 3/5 on the right and 4/5 on the left Sensation intact to light touch bilateral lower limbs to pain and light touch. DTR's 1+   Lab Results Last 48 Hours        Results for orders placed or performed during the hospital encounter of 02/21/16 (from the past 48 hour(s))  CBC with Differential/Platelet     Status: Abnormal   Collection Time: 02/26/16  4:25 PM  Result Value Ref Range   WBC 11.0 (H) 4.0 - 10.5 K/uL   RBC 3.88 (L) 4.22 - 5.81 MIL/uL   Hemoglobin 12.4 (L) 13.0 - 17.0 g/dL   HCT 35.8 (L) 39.0 - 52.0 %   MCV 92.3 78.0 - 100.0 fL   MCH 32.0 26.0 - 34.0 pg   MCHC 34.6 30.0 - 36.0 g/dL   RDW 14.3 11.5 - 15.5 %   Platelets 256 150 - 400 K/uL   Neutrophils Relative % 87 %   Neutro Abs 9.6 (H) 1.7 - 7.7 K/uL   Lymphocytes Relative 5 %  Lymphs Abs 0.6 (L) 0.7 - 4.0 K/uL   Monocytes Relative 8 %   Monocytes Absolute 0.8 0.1 - 1.0 K/uL   Eosinophils Relative 0 %   Eosinophils Absolute 0.0 0.0 - 0.7 K/uL   Basophils Relative 0 %   Basophils Absolute 0.0 0.0 - 0.1 K/uL  CBC     Status: Abnormal   Collection Time: 02/27/16  3:02 AM  Result Value Ref Range   WBC 9.8 4.0 - 10.5 K/uL   RBC 3.85 (L) 4.22 - 5.81 MIL/uL   Hemoglobin 12.2 (L) 13.0 - 17.0 g/dL   HCT 35.4 (L) 39.0 - 52.0 %   MCV 91.9 78.0 - 100.0 fL   MCH 31.7 26.0 - 34.0 pg   MCHC 34.5 30.0 - 36.0 g/dL   RDW 14.3 11.5 - 15.5 %   Platelets 276 150 - 400 K/uL  Heparin level (unfractionated)     Status: None   Collection Time: 02/27/16  3:02 AM  Result Value Ref Range   Heparin Unfractionated  0.35 0.30 - 0.70 IU/mL    Comment:        IF HEPARIN RESULTS ARE BELOW EXPECTED VALUES, AND PATIENT DOSAGE HAS BEEN CONFIRMED, SUGGEST FOLLOW UP TESTING OF ANTITHROMBIN III LEVELS.   CBC     Status: Abnormal   Collection Time: 02/28/16  5:48 AM  Result Value Ref Range   WBC 9.5 4.0 - 10.5 K/uL   RBC 3.95 (L) 4.22 - 5.81 MIL/uL   Hemoglobin 12.3 (L) 13.0 - 17.0 g/dL   HCT 37.2 (L) 39.0 - 52.0 %   MCV 94.2 78.0 - 100.0 fL   MCH 31.1 26.0 - 34.0 pg   MCHC 33.1 30.0 - 36.0 g/dL   RDW 14.5 11.5 - 15.5 %   Platelets 315 150 - 400 K/uL      Imaging Results (Last 48 hours)  Dg Chest Port 1 View  Result Date: 02/27/2016 CLINICAL DATA:  Status post right-sided PICC line insertion. EXAM: PORTABLE CHEST 1 VIEW COMPARISON:  Portable chest x-ray of February 21, 2016 FINDINGS: The right-sided PICC line tip projects at the junction of middle and distal portions of the SVC. There is no postprocedure pneumothorax. The lungs are mildly hypoinflated. The interstitial markings are increased but have improved since the study of 6 days ago. The cardiac silhouette remains enlarged. The pulmonary vascularity is less engorged. The bony thorax exhibits no acute abnormality. IMPRESSION: Reasonable positioning of the PICC line with the tip at the junction of the middle and distal portions of the SVC. No postplacement complication. Electronically Signed   By: David  Martinique M.D.   On: 02/27/2016 12:40        Medical Problem List and Plan: 1.  Decreased functional mobility and cognitive deficits secondary to encephalopathy/ bacteremia/discitis L4-5, L5-S1. Lumbar corset applied in sitting position             -admit to inpatient rehab 2.  DVT Prophylaxis/Anticoagulation: Eliquis 3. Pain Management: Lidoderm patch. Oxycodone as needed 4. Mood: Provide emotional support 5. Neuropsych: This patient is capable of making decisions on his own behalf. 6. Skin/Wound Care: Routine skin checks 7.  Fluids/Electrolytes/Nutrition: Routine I&O with follow-up chemistries 8. Infectious disease: plan is for 6 weeks penicillin G per infectious disease             -TEE today 9. New-onset atrial fibrillation as well as history of CAD with stenting. Cardizem 90 mg every 6 hours, Lopressor 125 mg twice a day. Cardiac rate  control. Follow-up cardiology services             -monitor for exercise tolerance with PT, check VS as needed 10. Hypertension. Lisinopril 20 mg daily and monitor with increased mobility 11. Hyperlipidemia. Lipitor 12. Constipation. Laxative assistance  Post Admission Physician Evaluation: 1. Functional deficits secondary  to lumbar discitis/bacteremia and encephalopathy. 2. Patient is admitted to receive collaborative, interdisciplinary care between the physiatrist, rehab nursing staff, and therapy team. 3. Patient's level of medical complexity and substantial therapy needs in context of that medical necessity cannot be provided at a lesser intensity of care such as a SNF. 4. Patient has experienced substantial functional loss from his/her baseline which was documented above under the "Functional History" and "Functional Status" headings.  Judging by the patient's diagnosis, physical exam, and functional history, the patient has potential for functional progress which will result in measurable gains while on inpatient rehab.  These gains will be of substantial and practical use upon discharge  in facilitating mobility and self-care at the household level. 5. Physiatrist will provide 24 hour management of medical needs as well as oversight of the therapy plan/treatment and provide guidance as appropriate regarding the interaction of the two. 6. The Preadmission Screening has been reviewed and patient status is unchanged unless otherwise stated above. 7. 24 hour rehab nursing will assist with bladder management, bowel management, safety, skin/wound care, disease management, medication  administration, pain management and patient education  and help integrate therapy concepts, techniques,education, etc. 8. PT will assess and treat for/with: Lower extremity strength, range of motion, stamina, balance, functional mobility, safety, adaptive techniques and equipment, ortho precautions, don/doff of corset, pain control, NMR.   Goals are: supervision. 9. OT will assess and treat for/with: ADL's, functional mobility, safety, upper extremity strength, adaptive techniques and equipment, NMR, don/doff of LSO, pain control, family education.   Goals are: mod I to min assist. Therapy may  proceed with showering this patient. 10. SLP will assess and treat for/with: cognition, communication, education.  Goals are: mod I. 11. Case Management and Social Worker will assess and treat for psychological issues and discharge planning. 12. Team conference will be held weekly to assess progress toward goals and to determine barriers to discharge. 13. Patient will receive at least 3 hours of therapy per day at least 5 days per week. 14. ELOS: 13-17 days       15. Prognosis:  excellent     Meredith Staggers, MD, [facility] 02/28/2016  Cathlyn Parsons., PA-C 02/28/2016

## 2016-02-28 NOTE — Progress Notes (Signed)
Patient to transfer to inpatient rehab [phone] report given to receiving RN Santiago Glad, all questions answered at this time.  Pt. VSS with no s/s of distress noted.  Patient stable at discharge.

## 2016-02-28 NOTE — Progress Notes (Signed)
  Echocardiogram Echocardiogram Transesophageal has been performed.  Tresa Res 02/28/2016, 1:56 PM

## 2016-02-28 NOTE — Final Progress Note (Deleted)
Pt arrived to unit in bed, no transfer at this time, pt alert to self and situation.  Pt skin is blanchable heels and buttocks, suture to right lateral ABD.  Pt lungs are diminshed in all lung fields, BS present x 4, Powerport PICC to RUE.  Back brace on at this time. Pt refused meal.

## 2016-02-28 NOTE — Progress Notes (Signed)
Charlett Blake, MD Physician Signed Physical Medicine and Rehabilitation  Consult Note Date of Service: 02/27/2016 1:40 PM  Related encounter: ED to Hosp-Admission (Current) from 02/21/2016 in [facility] 4E CV SURGICAL PROGRESSIVE CARE     Expand All Collapse All   [] Hide copied text [] Hover for attribution information      Physical Medicine and Rehabilitation Consult Reason for Consult: Discitis/sepsis Referring Physician: [facility]   HPI: Jonathan Holden is a 71 y.o. right handed male with history of hypertension, CAD with stenting, previous laminectomy at L4-5 and L5-S1. Per chart review patient lives with girlfriend. 2 level home with bedroom on main level. Reported to be independent prior to admission. Presented 02/21/2016 with low back pain and altered mental status. He was initially seen in the ED 02/17/2016 given a prescription of steroids and hydrocodone and sent home. A recent MRI as an outpatient showed herniated L3-4 and previous surgical changes. Patient gradually became more confused and returned back to the ED. Found to have A. fib with aVR. Alcohol and urine drug screens negative. Fever 102. WBC of 11,000. Lactic acid unremarkable. Troponin 0.06. Placed on broad-spectrum antibiotics for suspect sepsis. Neurosurgery has been consulted with workup ongoing with MRI reviewed that did not mention disc space infection however discitis was suspect. Cardiology consulted to address atrial fibrillation and currently on intravenous heparin and transitioned to Eliquis. Echocardiogram with ejection fraction 60% no wall motion abnormalities. There was no evidence of vegetation. Still with ongoing bouts of altered mental status. Occupational therapy evaluation completed with recommendations of physical medicine rehabilitation consult.   Review of Systems  Constitutional: Positive for fever. Negative for chills.  HENT: Negative for tinnitus.   Eyes: Negative for blurred vision and double  vision.  Respiratory: Negative for cough and shortness of breath.   Cardiovascular: Positive for leg swelling. Negative for chest pain and palpitations.  Gastrointestinal: Positive for constipation. Negative for nausea and vomiting.  Genitourinary: Positive for urgency. Negative for dysuria and hematuria.  Musculoskeletal: Positive for back pain and myalgias.  Skin: Negative for rash.  Neurological: Positive for weakness. Negative for seizures and loss of consciousness.  All other systems reviewed and are negative.      Past Medical History:  Diagnosis Date  . Arthritis HANDS AND RIGHT SHOULDER  . Coronary artery disease CARDIOLOGIST- DR Daneen Schick- LAST VISIT 6 MON AGO--  REQUESTED NOTE, EKG, ECHO, STRESS TEST   PT DENIES S & S  . H/O gastric ulcer   . History of MI (myocardial infarction) 1992  . Hyperlipidemia   . Hypertension   . Loose body of right shoulder   . S/P coronary angioplasty         Past Surgical History:  Procedure Laterality Date  . CARDIOVASCULAR STRESS TEST  08-22-2008---  DR Daneen Schick   NO ISCHEMIA.   Marland Kitchen CORONARY ANGIOPLASTY  1992  . ERCP W/ SPHICTEROTOMY  12-15-2002   AND STONE EXTRACTION  . LAPAROSCOPIC CHOLECYSTECTOMY  12-16-2002  . LUMBAR LAMINECTOMY  20 YRS AGO   L5         Family History  Problem Relation Age of Onset  . Other Mother     AGE 80 HEALTHY  . Heart attack Father 6    2 MI  . Emphysema    . Other Brother     HEALTHY   Social History:  reports that he quit smoking about 25 years ago. His smoking use included Cigarettes. He has a 40.00 pack-year smoking history. He has  never used smokeless tobacco. He reports that he drinks about 4.2 oz of alcohol per week . He reports that he does not use drugs. Allergies:       Allergies  Allergen Reactions  . Other Other (See Comments)    Pt reports allergic reaction to melons - unknown reaction  . Ciprofloxacin Hives         Medications Prior to  Admission  Medication Sig Dispense Refill  . Alirocumab (PRALUENT) 150 MG/ML SOPN Inject 150 mg into the skin every 14 (fourteen) days. 2 pen 11  . atorvastatin (LIPITOR) 80 MG tablet Take 40 mg by mouth daily.    . Coenzyme Q10 (COQ10 PO) Take 1 capsule by mouth daily.    . cyclobenzaprine (FLEXERIL) 10 MG tablet Take 10 mg by mouth 2 (two) times daily.     Marland Kitchen GLUCOSAMINE-CHONDROITIN PO Take 2 tablets by mouth daily.    Marland Kitchen lisinopril-hydrochlorothiazide (PRINZIDE,ZESTORETIC) 20-12.5 MG tablet Take 1 tablet by mouth daily. Patient is overdue for an appointment. Please call and schedule for further refills 90 tablet 0  . Melatonin 10 MG TABS Take 10-20 mg by mouth at bedtime as needed (sleep).     . methylPREDNISolone (MEDROL DOSEPAK) 4 MG TBPK tablet Take as directed on packet (Patient taking differently: Take 4 mg by mouth See admin instructions. Take as directed on package - tapered course) 21 tablet 0  . Multiple Vitamin (MULTIVITAMIN WITH MINERALS) TABS tablet Take 1 tablet by mouth daily. Centrum    . mupirocin ointment (BACTROBAN) 2 % Apply 1 application topically 2 (two) times daily as needed (wound care/ eczema).    . naloxegol oxalate (MOVANTIK) 25 MG TABS tablet Take 25 mg by mouth 2 (two) times daily.    . naproxen sodium (ALEVE) 220 MG tablet Take 660 mg by mouth daily as needed (pain).    . Omega-3 Fatty Acids (FISH OIL) 1000 MG CAPS Take 1,000 mg by mouth daily.    Marland Kitchen oxyCODONE-acetaminophen (PERCOCET) 10-325 MG tablet Take 1 tablet by mouth every 4 (four) hours as needed for pain.     . prednisoLONE acetate (PRED FORTE) 1 % ophthalmic suspension Place 1 drop into the left eye 3 (three) times daily.    Marland Kitchen HYDROcodone-acetaminophen (NORCO/VICODIN) 5-325 MG tablet Take 1-2 tablets by mouth every 6 (six) hours as needed. (Patient not taking: Reported on 02/21/2016) 10 tablet 0  . metoprolol succinate (TOPROL XL) 25 MG 24 hr tablet Take 1 tablet (25 mg total) by mouth  daily. (Patient not taking: Reported on 02/21/2016) 90 tablet 1    Home: [place] expects to be discharged to:: Private residence Living Arrangements: Spouse/significant other (girlfriend) Available Help at Discharge: Available 24 hours/day (girlfriend) Type of Home: House Home Access: Stairs to enter Technical brewer of Steps: 1+1 Entrance Stairs-Rails: None Home Layout: Two level, Able to live on main level with bedroom/bathroom Bathroom Shower/Tub: Multimedia programmer: Standard Home Equipment: None  Functional History: Prior Function Level of Independence: Independent Functional Status:  Mobility: Bed Mobility Overal bed mobility: Needs Assistance Bed Mobility: Rolling, Sidelying to Sit Rolling: Mod assist Sidelying to sit: Mod assist General bed mobility comments: instructed in log roll technique Transfers Overall transfer level: Needs assistance Equipment used: Rolling walker (2 wheeled) Transfers: Sit to/from Stand Sit to Stand: Mod assist, From elevated surface General transfer comment: assist to rise and steady, increased time, cues for hand placement  ADL: ADL Overall ADL's : Needs assistance/impaired Eating/Feeding: Set up, Sitting Grooming:  Set up, Sitting Upper Body Bathing: Moderate assistance, Sitting Lower Body Bathing: Total assistance, Sit to/from stand Upper Body Dressing : Maximal assistance, Sitting Upper Body Dressing Details (indicate cue type and reason): began educating in donning back brace Lower Body Dressing: Total assistance, Sit to/from stand Toilet Transfer: Moderate assistance, RW, Ambulation Toileting- Clothing Manipulation and Hygiene: Total assistance, Sit to/from stand Functional mobility during ADLs: Moderate assistance, Rolling walker, Cueing for sequencing, Cueing for safety  Cognition: Cognition Overall Cognitive Status: Impaired/Different from baseline Orientation Level: Oriented X4  (answers questions approp, but still appears slightly confuse) Cognition Arousal/Alertness: Awake/alert Behavior During Therapy: WFL for tasks assessed/performed Overall Cognitive Status: Impaired/Different from baseline Area of Impairment: Orientation, Attention, Memory, Safety/judgement, Problem solving, Following commands Orientation Level: Disoriented to, Time Current Attention Level: Selective Memory: Decreased short-term memory Following Commands: Follows one step commands with increased time Safety/Judgement: Decreased awareness of safety, Decreased awareness of deficits Problem Solving: Slow processing, Difficulty sequencing, Requires verbal cues, Requires tactile cues  Blood pressure (!) 134/91, pulse (!) 114, temperature 97.6 F (36.4 C), temperature source Oral, resp. rate 17, height 6\' 1"  (1.854 m), weight 96.4 kg (212 lb 8.4 oz), SpO2 95 %. Physical Exam  Constitutional: He appears well-developed.  71 year old right-handed male sitting up in chair  HENT:  Head: Normocephalic.  Eyes: EOM are normal.  Neck: Normal range of motion. Neck supple. No thyromegaly present.  Cardiovascular:  Cardiac rate control  Respiratory: Breath sounds normal. No respiratory distress.  GI: Soft. Bowel sounds are normal. He exhibits no distension.  Neurological: He is alert.  He is oriented to person, date of birth in place. He cannot recall for events of his hospital stay. Follows simple commands  Skin: Skin is warm and dry.  Patient confabulates, describes rehabilitation team working with him yesterday, but no therapy notes seen and chart Motor strength is 5/5 bilateral deltoid, bicep, triceps, grip 4-/5. Bilateral hip flexor, knee extensor, ankle dorsiflexor is 3 on the right and 4 on the left Sensation intact to light touch bilateral lower limbs Lab Results Last 24 Hours       Results for orders placed or performed during the hospital encounter of 02/21/16 (from the past 24 hour(s))    CBC with Differential/Platelet     Status: Abnormal   Collection Time: 02/26/16  4:25 PM  Result Value Ref Range   WBC 11.0 (H) 4.0 - 10.5 K/uL   RBC 3.88 (L) 4.22 - 5.81 MIL/uL   Hemoglobin 12.4 (L) 13.0 - 17.0 g/dL   HCT 35.8 (L) 39.0 - 52.0 %   MCV 92.3 78.0 - 100.0 fL   MCH 32.0 26.0 - 34.0 pg   MCHC 34.6 30.0 - 36.0 g/dL   RDW 14.3 11.5 - 15.5 %   Platelets 256 150 - 400 K/uL   Neutrophils Relative % 87 %   Neutro Abs 9.6 (H) 1.7 - 7.7 K/uL   Lymphocytes Relative 5 %   Lymphs Abs 0.6 (L) 0.7 - 4.0 K/uL   Monocytes Relative 8 %   Monocytes Absolute 0.8 0.1 - 1.0 K/uL   Eosinophils Relative 0 %   Eosinophils Absolute 0.0 0.0 - 0.7 K/uL   Basophils Relative 0 %   Basophils Absolute 0.0 0.0 - 0.1 K/uL  CBC     Status: Abnormal   Collection Time: 02/27/16  3:02 AM  Result Value Ref Range   WBC 9.8 4.0 - 10.5 K/uL   RBC 3.85 (L) 4.22 - 5.81 MIL/uL   Hemoglobin 12.2 (  L) 13.0 - 17.0 g/dL   HCT 35.4 (L) 39.0 - 52.0 %   MCV 91.9 78.0 - 100.0 fL   MCH 31.7 26.0 - 34.0 pg   MCHC 34.5 30.0 - 36.0 g/dL   RDW 14.3 11.5 - 15.5 %   Platelets 276 150 - 400 K/uL  Heparin level (unfractionated)     Status: None   Collection Time: 02/27/16  3:02 AM  Result Value Ref Range   Heparin Unfractionated 0.35 0.30 - 0.70 IU/mL      Imaging Results (Last 48 hours)  Ct Maxillofacial W Contrast  Addendum Date: 02/26/2016   ADDENDUM REPORT: 02/26/2016 17:06 ADDENDUM: There is mild lucency surrounding the roots of the most posterior remaining right mandibular molar. Otherwise, there is no abnormality of the remaining teeth. I discussed this on 02/26/2016 at 5:03 pm with Dr. Rhina Brackett DAM . Electronically Signed   By: Ulyses Jarred M.D.   On: 02/26/2016 17:06   Result Date: 02/26/2016 CLINICAL DATA:  Bacteremia.  Investigation for source. EXAM: CT MAXILLOFACIAL WITH CONTRAST TECHNIQUE: Multidetector CT imaging of the maxillofacial structures was performed.  Multiplanar CT image reconstructions were also generated. A small metallic BB was placed on the right temple in order to reliably differentiate right from left. COMPARISON:  None. FINDINGS: Osseous: No facial fracture. Orbits: There is a left scleral band. Bilateral lens replacements. Otherwise normal. Sinuses --Frontal: There is mild left frontal sinus mucosal thickening inferiorly with suspected occlusion of the left frontal recess. No right frontal sinus mucosal thickening. The right frontal recess is patent. --Ethmoid: Minimal ethmoid sinus mucosal thickening without fluid levels or osseous changes. --Sphenoid: Minimal sphenoid mucosal thickening without fluid levels or osseous abnormalities. Sphenoethmoidal recesses and sphenoid ostia are patent. --Maxillary: Bilateral minimal maxillary mucosal thickening without fluid levels or osseous abnormalities. There is bilateral narrowing of the infundibular recesses. --Nasal cavity: 4 mm of rightward septal deviation. --Nasopharynx: Clear. --Mastoid air cells: No effusion. Soft tissues: Normal. Limited intracranial: Normal. IMPRESSION: Very mild mucosal thickening of all paranasal sinuses without evidence of acute sinusitis. 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 Electronically Signed: By: Ulyses Jarred M.D. On: 02/25/2016 19:49   Dg Chest Port 1 View  Result Date: 02/27/2016 CLINICAL DATA:  Status post right-sided PICC line insertion. EXAM: PORTABLE CHEST 1 VIEW COMPARISON:  Portable chest x-ray of February 21, 2016 FINDINGS: The right-sided PICC line tip projects at the junction of middle and distal portions of the SVC. There is no postprocedure pneumothorax. The lungs are mildly hypoinflated. The interstitial markings are increased but have improved since the study of 6 days ago. The cardiac silhouette remains enlarged. The pulmonary vascularity is less engorged. The bony thorax exhibits no acute abnormality. IMPRESSION:  Reasonable positioning of the PICC line with the tip at the junction of the middle and distal portions of the SVC. No postplacement complication. Electronically Signed   By: David  Martinique M.D.   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 Does the need for close, 24 hr/day medical supervision in concert with the patient's rehab needs make it unreasonable for this patient to be served in a less intensive setting? Yes 2. Co-Morbidities requiring supervision/potential complications: Low back pain, bacteremia, encephalopathy 3. Due to bladder management, bowel management, safety, skin/wound care, disease management, medication administration, pain management and patient education, does the  patient require 24 hr/day rehab nursing? Yes 4. Does the patient require coordinated care of a physician, rehab nurse, PT (1-2 hrs/day, 5 days/week), OT (1-2 hrs/day, 5 days/week) and SLP (.5-1 hrs/day, 5 days/week) to address physical and functional deficits in the context of the above medical diagnosis(es)? Yes Addressing deficits in the following areas: balance, endurance, locomotion, strength, transferring, bowel/bladder control, bathing, dressing, feeding, grooming, toileting, cognition and psychosocial support 5. Can the patient actively participate in an intensive therapy program of at least 3 hrs of therapy per day at least 5 days per week? Yes 6. The potential for patient to make measurable gains while on inpatient rehab is good 7. Anticipated functional outcomes upon discharge from inpatient rehab are supervision  with PT, supervision with OT, supervision with SLP. 8. Estimated rehab length of stay to reach the above functional goals is: 14-17d 9. Does the patient have adequate social supports and living environment to accommodate these  discharge functional goals? Yes 10. Anticipated D/C setting: Home 11. Anticipated post D/C treatments: [place] therapy 12. Overall Rehab/Functional Prognosis: good  RECOMMENDATIONS: This patient's condition is appropriate for continued rehabilitative care in the following setting: CIR Patient has agreed to participate in recommended program. Yes Note that insurance prior authorization may be required for reimbursement for recommended care.  Comment:   Charlett Blake M.D. [facility] FAAPM&R (Sports Med, Neuromuscular Med) Diplomate Am Board of Electrodiagnostic Med    Cathlyn Parsons., PA-C 02/27/2016    Revision History                        Routing History

## 2016-02-28 NOTE — Progress Notes (Signed)
Physical Therapy Treatment Patient Details Name: Jonathan Holden MRN: [id number] DOB: Oct 19, 1945 Today's Date: 02/28/2016    History of Present Illness Pt admittedd 02/21/16 with AMS and back pain. Pt dx with sepsis, discitits, a fib with RVR. PMH: CAD, HTN, arthritis, back surgery 25 years ago.    PT Comments    Pt is making steady progress toward goals. Acute PT to continue during pt's hospital stay.  Follow Up Recommendations  CIR     Equipment Recommendations  Rolling walker with 5" wheels;3in1 (PT)    Precautions / Restrictions Precautions Precautions: Fall Precaution Comments: Reinforced back precautions with mobility today Required Braces or Orthoses: Spinal Brace Spinal Brace: Lumbar corset;Applied in sitting position Restrictions Weight Bearing Restrictions: No    Mobility  Bed Mobility               General bed mobility comments: not observed- in bathroom with nursing on arrival and to recliner after session  Transfers Overall transfer level: Needs assistance Equipment used: Rolling walker (2 wheeled) Transfers: Sit to/from Stand Sit to Stand: Max assist;From elevated surface;Min assist         General transfer comment: max assist with cues for technique to stand from low toilet in bathroom using grab bar and door frame to pull up on (nursing educated to use 3n1 over toilet from now on to assit with this); min assist to sit down to bed with cues on hand placement. min assist to stand from elevated toilet with cues on hand placement and anterior weight shifting to assist with standing. min assist to sit in recliner with cues on hand placement/safety.                              Ambulation/Gait Ambulation/Gait assistance: Min assist Ambulation Distance (Feet): 15 Feet Assistive device: Rolling walker (2 wheeled) Gait Pattern/deviations: Step-through pattern;Decreased stride length;Narrow base of support;Decreased step length - right;Decreased step length -  left Gait velocity: decreased Gait velocity interpretation: Below normal speed for age/gender General Gait Details: overall unsteady gait pattern with heavey reliance on UE's on RW. pt with couple instances of feet crossing over/scissoring with gait during turns and/or furniture negotiation. cues needed on posture, step length, walker position and safety with gait.                                   Balance           Standing balance support: No upper extremity supported;During functional activity Standing balance-Leahy Scale: Poor Standing balance comment: at sink to wash hands and face, had hips/trunk propped on counter top in order to leg go with UE's min assist for balance. cues to adhere to back precautions with mobility.        Cognition Arousal/Alertness: Awake/alert Behavior During Therapy: WFL for tasks assessed/performed Overall Cognitive Status: Impaired/Different from baseline Area of Impairment: Safety/judgement;Awareness;Problem solving;Following commands Orientation Level: Disoriented to;Time Current Attention Level: Selective Memory: Decreased short-term memory;Decreased recall of precautions Following Commands: Follows one step commands with increased time Safety/Judgement: Decreased awareness of safety;Decreased awareness of deficits   Problem Solving: Slow processing;Difficulty sequencing;Requires verbal cues;Requires tactile cues General Comments: max cues needed for general safety with session today with increased time needed for pt to process information being provided. For instance pt let go of RW multiple times during session needing redirection to hold walker with gait.  Pertinent Vitals/Pain Pain Assessment: 0-10 Pain Score: 3  Pain Location: back Pain Descriptors / Indicators: Aching;Sore Pain Intervention(s): Limited activity within patient's tolerance;Monitored during session;Premedicated before session;Repositioned      PT Goals (current goals can now be found in the care plan section) Acute Rehab PT Goals Patient Stated Goal: to return to PLOF PT Goal Formulation: With patient Time For Goal Achievement: 03/12/16 Potential to Achieve Goals: Fair Progress towards PT goals: Progressing toward goals    Frequency    Min 3X/week      PT Plan Current plan remains appropriate    End of Session Equipment Utilized During Treatment: Gait belt;Back brace Activity Tolerance: Patient tolerated treatment well;No increased pain;Patient limited by fatigue Patient left: in chair;with call bell/phone within reach (attempted to set chair alarm, cord missing to plug it in, NT unable to locate it and is aware if's not on)     Time: 0920-0945 PT Time Calculation (min) (ACUTE ONLY): 25 min  Charges:  $Gait Training: 8-22 mins $Therapeutic Activity: 8-22 mins            Willow Ora 02/28/2016, 10:00 AM   Willow Ora, PTA, CLT Acute Rehab Services [phone] 02/28/16, 10:01 AM

## 2016-02-28 NOTE — Interval H&P Note (Signed)
History and Physical Interval Note:  02/28/2016 11:51 AM  Jonathan Holden  has presented today for surgery, with the diagnosis of BACTEREMIA  The various methods of treatment have been discussed with the patient and family. After consideration of risks, benefits and other options for treatment, the patient has consented to  Procedure(s): TRANSESOPHAGEAL ECHOCARDIOGRAM (TEE) (N/A) as a surgical intervention .  The patient's history has been reviewed, patient examined, no change in status, stable for surgery.  I have reviewed the patient's chart and labs.  Questions were answered to the patient's satisfaction.     Dorris Carnes

## 2016-02-28 NOTE — Progress Notes (Signed)
Discussed with Dr. Sherral Hammers. We will plan admit to inpt rehab after TEE recovery this afternoon. I have discussed with RN, Pamala Hurry. I will meet with pt's brother, Linna Hoff at 3 pm to complete admission paperwork. [id number]

## 2016-02-28 NOTE — H&P (View-Only) (Signed)
Patient Name: Jonathan Holden Date of Encounter: 02/27/2016  Primary Cardiologist: Dr. 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 No CP or SOB. Does not feel elevated HR   Inpatient Medications    Scheduled Meds: . Alirocumab  150 mg Subcutaneous Q14 Days  . atorvastatin  40 mg Oral Daily  . diltiazem  60 mg Oral Q6H  . famotidine  20 mg Oral BID  . folic acid  1 mg Oral Daily  . lidocaine  2 patch Transdermal Q24H  . lisinopril  20 mg Oral Daily  . mouth rinse  15 mL Mouth Rinse BID  . metoprolol tartrate  100 mg Oral BID  . multivitamin with minerals  1 tablet Oral Daily  . pantoprazole  40 mg Oral Q1200  . pencillin G potassium IV  4 Million Units Intravenous Q4H  . pneumococcal 23 valent vaccine  0.5 mL Intramuscular Tomorrow-1000  . prednisoLONE acetate  1 drop Left Eye TID  . thiamine  100 mg Oral Daily   Continuous Infusions: . heparin 2,400 Units/hr (02/26/16 2322)   PRN Meds: acetaminophen **OR** acetaminophen, alum & mag hydroxide-simeth, gi cocktail, haloperidol lactate, hydrALAZINE, loperamide, Melatonin, metoprolol, ondansetron **OR** ondansetron (ZOFRAN) IV, oxyCODONE-acetaminophen, polyethylene glycol   Vital Signs    Vitals:   02/26/16 2023 02/27/16 0008 02/27/16 0425 02/27/16 0726  BP: (!) 156/102 (!) 143/102 (!) 149/101 (!) 147/97  Pulse: (!) 139 97 (!) 118 (!) 116  Resp: (!) 25 (!) 21 (!) 24 (!) 23  Temp: 98.3 F (36.8 C) 98 F (36.7 C) 97.7 F (36.5 C) 98.1 F (36.7 C)  TempSrc: Oral Oral Oral Oral  SpO2: 99% 96% 98% 99%  Weight:   212 lb 8.4 oz (96.4 kg)     Height:        Intake/Output Summary (Last 24 hours) at 02/27/16 0745 Last data filed at 02/27/16 0400  Gross per 24 hour  Intake          2421.83 ml  Output             3275 ml  Net          -853.17 ml   Filed Weights   02/25/16 0323 02/26/16 0454 02/27/16 0425  Weight: 219 lb 12.8 oz (99.7 kg) 220 lb 10.9 oz (100.1 kg) 212 lb 8.4 oz (96.4 kg)    Physical Exam   GEN: Well nourished, well developed, in no acute distress.  HEENT: Grossly normal.  Neck: Supple, no JVD, carotid bruits, or masses. Cardiac: irreg irreg, tachycardic, no murmurs, rubs, or gallops. No clubbing, cyanosis, edema.  Radials/DP/PT 2+ and equal bilaterally.  Respiratory:  Respirations regular and unlabored, clear to auscultation bilaterally. GI: Soft, nontender, nondistended, BS + x 4. MS: no deformity or atrophy. Skin: warm and dry, no rash. Neuro:  Strength and sensation are intact. Psych: AAOx3.  Normal affect.  Labs    CBC  Recent Labs  02/26/16 1625 02/27/16 0302  WBC 11.0* 9.8  NEUTROABS 9.6*  --   HGB 12.4* 12.2*  HCT 35.8*  35.4*  MCV 92.3 91.9  PLT 256 [id number]   Basic Metabolic Panel  Recent Labs  02/25/16 0401  NA 134*  K 4.2  CL 104  CO2 19*  GLUCOSE 134*  BUN 18  CREATININE 0.85  CALCIUM 7.8*   Liver Function Tests  Recent Labs  02/25/16 0401  AST 59*  ALT 70*  ALKPHOS 53  BILITOT 0.7  PROT 5.3*  ALBUMIN 2.1*     Telemetry    Afib with RVR with freq PVCs and bigeminy HR 100-120 - Personally Reviewed  ECG    Afib with RVR with freq PVCs  - Personally Reviewed  Radiology    Ct Maxillofacial W Contrast  Addendum Date: 02/26/2016   ADDENDUM REPORT: 02/26/2016 17:06 ADDENDUM: There is mild lucency surrounding the roots of the most posterior remaining right mandibular molar. Otherwise, there is no abnormality of the remaining teeth. I discussed this on 02/26/2016 at 5:03 pm with Dr. Rhina Brackett DAM . Electronically Signed   By: Ulyses Jarred M.D.   On: 02/26/2016  17:06   Result Date: 02/26/2016 CLINICAL DATA:  Bacteremia.  Investigation for source. EXAM: CT MAXILLOFACIAL WITH CONTRAST TECHNIQUE: Multidetector CT imaging of the maxillofacial structures was performed. Multiplanar CT image reconstructions were also generated. A small metallic BB was placed on the right temple in order to reliably differentiate right from left. COMPARISON:  None. FINDINGS: Osseous: No facial fracture. Orbits: There is a left scleral band. Bilateral lens replacements. Otherwise normal. Sinuses --Frontal: There is mild left frontal sinus mucosal thickening inferiorly with suspected occlusion of the left frontal recess. No right frontal sinus mucosal thickening. The right frontal recess is patent. --Ethmoid: Minimal ethmoid sinus mucosal thickening without fluid levels or osseous changes. --Sphenoid: Minimal sphenoid mucosal thickening without fluid levels or osseous abnormalities. Sphenoethmoidal recesses and sphenoid ostia are patent. --Maxillary: Bilateral minimal maxillary mucosal thickening without fluid levels or osseous abnormalities. There is bilateral narrowing of the infundibular recesses. --Nasal cavity: 4 mm of rightward septal deviation. --Nasopharynx: Clear. --Mastoid air cells: No effusion. Soft tissues: Normal. Limited intracranial: Normal. IMPRESSION: Very mild mucosal thickening of all paranasal sinuses without evidence of acute sinusitis. 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 Electronically Signed: By: Ulyses Jarred M.D. On: 02/25/2016 19:49    Cardiac Studies   2D ECHO: 02/25/2016 LV EF: 55%-60% Study Conclusions - Left ventricle: The cavity size was normal. There was mild focal   basal hypertrophy of the septum. Systolic function was normal.   The estimated ejection fraction was in the range of 55% to 60%.   Wall motion was normal; there were no regional wall motion   abnormalities. The study is not technically sufficient to  allow   evaluation of LV diastolic function. - Aortic valve: Trileaflet; moderately thickened, mildly calcified   leaflets. - Tricuspid valve: There was trivial regurgitation. - Pulmonary arteries: Systolic pressure was moderately increased.   PA peak pressure: 47 mm Hg (S). Impressions: - There was no evidence of a vegetation.   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 He was found to have sepsis with streptococcal bacteremia. In the ED the patient was found to be in SVT and was given adenosine which revealed A. fib with RVR and cardiology consulted.    Assessment & Plan  Afib with RVR (new diagnosis of afib): -- Lopressor increased from 50mg  to 100 mg BID yesterday and Diltiazem 60 Q 6 also added.  Overall, HRs improved from yesterday but still 100-120s. Will increase dilt to 90mg  q6. -- 2D ECHO with normal EF and mod increased PA pressure (47) -- CHADSVASC of at least 3 (HTN, age, vasc dz). We can start on Eliquis 5mg  BID as soon as PICC line placed and we know there is no need for a biopsy. Continue IV heparin for now  Staph bacteremia: ID requesting TEE. Arranged for tomorrow at 12 noon. NPO after midnight. orders placed.  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 Christella Noa  HTN: BP improved from yesterday but still mildly elevated.   CAD: hx of remote MI. Troponin mildly elevated with flat trend c/w demand ischemia in the setting of afib with RVR and sepsis  Signed, Angelena Form, PA-C  02/27/2016, 7:45 AM    Attending Note:   The patient was seen and examined.  Agree with assessment and plan as noted above.  Changes made to the above note as needed.  Patient seen and independently examined with Nell Range, [place] .   We discussed all aspects of the encounter. I agree with the assessment and plan  as stated above.  1. Atrial fib:   Agree with gradual titration upward of his metoprolol for better rate control.  Start Eliquis 5 BID  DC heparin   2. Discitis:   For TEE tomorrow to look for possible endocarditis     I have spent a total of 40 minutes with patient reviewing hospital  notes , telemetry, EKGs, labs and examining patient as well as establishing an assessment and plan that was discussed with the patient. > 50% of time was spent in direct patient care.    Thayer Headings, Brooke Bonito., MD, [facility] 02/27/2016, 12:19 PM [address]  [place] Pager [phone]

## 2016-02-28 NOTE — Progress Notes (Signed)
Patient Name: Jonathan Holden Date of Encounter: 02/28/2016  Primary Cardiologist: Dr. Laurena Bering Problem List     Principal Problem:   Sepsis [facility]) Active Problems:   Hyperlipidemia   CAD (coronary artery disease), native coronary artery   Essential hypertension   Hyponatremia   Atrial fibrillation with RVR ([place])   Back pain   Acute encephalopathy   Streptococcal bacteremia   Lumbar discitis   Endocarditis of native valve   Infection of intervertebral disc (HCC)   Ileus (HCC)   Bacteremia   Pulmonary hypertension   Low back pain     Subjective   Feeling much better today. No CP or SOB. Does not feel elevated HR   Inpatient Medications    Scheduled Meds: . Alirocumab  150 mg Subcutaneous Q14 Days  . apixaban  5 mg Oral BID  . atorvastatin  40 mg Oral Daily  . diltiazem  90 mg Oral Q6H  . famotidine  20 mg Oral BID  . folic acid  1 mg Oral Daily  . lidocaine  2 patch Transdermal Q24H  . lisinopril  20 mg Oral Daily  . mouth rinse  15 mL Mouth Rinse BID  . metoprolol tartrate  125 mg Oral BID  . multivitamin with minerals  1 tablet Oral Daily  . pantoprazole  40 mg Oral Q1200  . pencillin G potassium IV  4 Million Units Intravenous Q4H  . pneumococcal 23 valent vaccine  0.5 mL Intramuscular Tomorrow-1000  . prednisoLONE acetate  1 drop Left Eye TID  . sodium chloride flush  10-40 mL Intracatheter Q12H  . thiamine  100 mg Oral Daily   Continuous Infusions:  PRN Meds: acetaminophen **OR** acetaminophen, alum & mag hydroxide-simeth, gi cocktail, haloperidol lactate, hydrALAZINE, loperamide, Melatonin, metoprolol, ondansetron **OR** ondansetron (ZOFRAN) IV, oxyCODONE-acetaminophen, polyethylene glycol, sodium chloride flush   Vital Signs    Vitals:   02/28/16 1240 02/28/16 1245 02/28/16 1253 02/28/16 1300  BP: (!) 121/46 124/63 128/75 130/78  Pulse: 79 81 80 81  Resp: (!) 25 18 16 18   Temp:   98.5 F (36.9 C)   TempSrc:   Oral   SpO2: 95% 95% 95%  97%  Weight:      Height:        Intake/Output Summary (Last 24 hours) at 02/28/16 1335 Last data filed at 02/28/16 0400  Gross per 24 hour  Intake             1230 ml  Output             1525 ml  Net             -295 ml   Filed Weights   02/26/16 0454 02/27/16 0425 02/28/16 0500  Weight: 220 lb 10.9 oz (100.1 kg) 212 lb 8.4 oz (96.4 kg) 212 lb 11.9 oz (96.5 kg)    Physical Exam   GEN: Well nourished, well developed, in no acute distress.  HEENT: Grossly normal.  Neck: Supple, no JVD, carotid bruits, or masses. Cardiac: RR ,  Soft systolic murmur, rubs, or gallops. No clubbing, cyanosis, edema.  Radials/DP/PT 2+ and equal bilaterally.  Respiratory:  Respirations regular and unlabored, clear to auscultation bilaterally. GI: Soft, nontender, nondistended, BS + x 4. MS: no deformity or atrophy. Skin: warm and dry, no rash. Neuro:  Strength and sensation are intact. Psych: AAOx3.  Normal affect.  Labs    CBC  Recent Labs  02/26/16 1625 02/27/16 0302 02/28/16 [id number]  WBC 11.0* 9.8 9.5  NEUTROABS 9.6*  --   --   HGB 12.4* 12.2* 12.3*  HCT 35.8* 35.4* 37.2*  MCV 92.3 91.9 94.2  PLT 256 [id number]   Basic Metabolic Panel No results for input(s): NA, K, CL, CO2, GLUCOSE, BUN, CREATININE, CALCIUM, MG, PHOS in the last 72 hours. Liver Function Tests No results for input(s): AST, ALT, ALKPHOS, BILITOT, PROT, ALBUMIN in the last 72 hours.   Telemetry    Afib with RVR with freq PVCs and bigeminy HR 100-120 - Personally Reviewed  ECG    Afib with RVR with freq PVCs  - Personally Reviewed  Radiology    Dg Chest Port 1 View  Result Date: 02/27/2016 CLINICAL DATA:  Status post right-sided PICC line insertion. EXAM: PORTABLE CHEST 1 VIEW COMPARISON:  Portable chest x-ray of February 21, 2016 FINDINGS: The right-sided PICC line tip projects at the junction of middle and distal portions of the SVC. There is no postprocedure pneumothorax. The lungs are mildly hypoinflated. The  interstitial markings are increased but have improved since the study of 6 days ago. The cardiac silhouette remains enlarged. The pulmonary vascularity is less engorged. The bony thorax exhibits no acute abnormality. IMPRESSION: Reasonable positioning of the PICC line with the tip at the junction of the middle and distal portions of the SVC. No postplacement complication. Electronically Signed   By: David  Martinique M.D.   On: 02/27/2016 12:40    Cardiac Studies   2D ECHO: 02/25/2016 LV EF: 55%-60% Study Conclusions - Left ventricle: The cavity size was normal. There was mild focal   basal hypertrophy of the septum. Systolic function was normal.   The estimated ejection fraction was in the range of 55% to 60%.   Wall motion was normal; there were no regional wall motion   abnormalities. The study is not technically sufficient to allow   evaluation of LV diastolic function. - Aortic valve: Trileaflet; moderately thickened, mildly calcified   leaflets. - Tricuspid valve: There was trivial regurgitation. - Pulmonary arteries: Systolic pressure was moderately increased.   PA peak pressure: 47 mm Hg (S). Impressions: - There was no evidence of a vegetation.   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 He was found to have sepsis with streptococcal bacteremia. In the ED the patient was found to be in SVT and was given adenosine which revealed A. fib with RVR and cardiology consulted.    Assessment & Plan    Afib with RVR (new diagnosis of afib): -- Lopressor increased from 50mg  to 100 mg BID yesterday and Diltiazem 60 Q 6 also added.    He has converted to NSR   Staph bacteremia: ITEE did not show any definite vegatation . He will need long term abx for his discitis.    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  Christella Noa  HTN: BP improved from yesterday but still mildly elevated.   CAD: hx of remote MI. Troponin mildly elevated with flat trend c/w demand ischemia in the setting of afib with RVR and sepsis     Mertie Moores, MD  02/28/2016 2:02 PM    [facility] [place],  [place], [place]  [id number] Pager [phone] Phone: [phone]; Fax: [phone]

## 2016-02-28 NOTE — H&P (Signed)
Physical Medicine and Rehabilitation Admission H&P    Chief Complaint  Patient presents with  . Back Pain  : HPI: Jonathan Holden is a 71 y.o. right handed male with history of hypertension, CAD with stenting, previous laminectomy at L4-5 and L5-S1. Per chart review patient lives with girlfriend. 2 level home with bedroom on main level. Reported to be independent prior to admission. Presented 02/21/2016 with low back pain and altered mental status. He was initially seen in the ED 02/17/2016 given a prescription of steroids and hydrocodone and sent home. A recent MRI as an outpatient showed herniated L3-4 and previous surgical changes. Patient gradually became more confused and returned back to the ED. Found to have A. fib with aVR. Alcohol and urine drug screens negative. Fever 102. WBC of 11,000. Lactic acid unremarkable. Troponin 0.06. Placed on broad-spectrum antibiotics for suspect sepsis. Neurosurgery has been consulted with workup ongoing with MRI reviewed that did not mention disc space infection however discitis was suspect L4-5, L5-S1. Lumbar corset applied in sitting position when up with therapies. Infectious disease consulted pathogen consistent with streptococcal bacteremia and placed on high-dose penicillin 6 weeks. Cardiology consulted to address atrial fibrillation and currently on intravenous heparin and transitioned to Eliquis. Echocardiogram with ejection fraction 60% no wall motion abnormalities. TEE pending.  Still with ongoing bouts of confusion but continues to improve . Physical and  Occupational therapy evaluations completed with recommendations of physical medicine rehabilitation consult. Patient was admitted for a comprehensive rehabilitation program  Review of Systems  Constitutional: Negative for fever.  HENT: Negative for hearing loss and tinnitus.   Eyes: Negative for blurred vision and double vision.  Respiratory: Negative for cough and shortness of breath.     Cardiovascular: Positive for palpitations and leg swelling. Negative for chest pain.  Gastrointestinal: Positive for constipation. Negative for nausea and vomiting.  Genitourinary: Positive for urgency. Negative for hematuria.  Musculoskeletal: Positive for joint pain and myalgias.  Skin: Negative for rash.  Neurological: Positive for weakness. Negative for seizures and loss of consciousness.  Psychiatric/Behavioral: The patient has insomnia.   All other systems reviewed and are negative.  Past Medical History:  Diagnosis Date  . Arthritis HANDS AND RIGHT SHOULDER  . Coronary artery disease CARDIOLOGIST- DR Daneen Schick- LAST VISIT 6 MON AGO--  REQUESTED NOTE, EKG, ECHO, STRESS TEST   PT DENIES S & S  . H/O gastric ulcer   . History of MI (myocardial infarction) 1992  . Hyperlipidemia   . Hypertension   . Loose body of right shoulder   . S/P coronary angioplasty    Past Surgical History:  Procedure Laterality Date  . CARDIOVASCULAR STRESS TEST  08-22-2008---  DR Daneen Schick   NO ISCHEMIA.   Marland Kitchen CORONARY ANGIOPLASTY  1992  . ERCP W/ SPHICTEROTOMY  12-15-2002   AND STONE EXTRACTION  . LAPAROSCOPIC CHOLECYSTECTOMY  12-16-2002  . LUMBAR LAMINECTOMY  20 YRS AGO   L5   Family History  Problem Relation Age of Onset  . Other Mother     AGE 7 HEALTHY  . Heart attack Father 20    2 MI  . Emphysema    . Other Brother     HEALTHY   Social History:  reports that he quit smoking about 25 years ago. His smoking use included Cigarettes. He has a 40.00 pack-year smoking history. He has never used smokeless tobacco. He reports that he drinks about 4.2 oz of alcohol per week . He reports that  he does not use drugs. Allergies:  Allergies  Allergen Reactions  . Other Other (See Comments)    Pt reports allergic reaction to melons - unknown reaction  . Ciprofloxacin Hives   Medications Prior to Admission  Medication Sig Dispense Refill  . Alirocumab (PRALUENT) 150 MG/ML SOPN Inject 150  mg into the skin every 14 (fourteen) days. 2 pen 11  . atorvastatin (LIPITOR) 80 MG tablet Take 40 mg by mouth daily.    . Coenzyme Q10 (COQ10 PO) Take 1 capsule by mouth daily.    . cyclobenzaprine (FLEXERIL) 10 MG tablet Take 10 mg by mouth 2 (two) times daily.     Marland Kitchen GLUCOSAMINE-CHONDROITIN PO Take 2 tablets by mouth daily.    Marland Kitchen lisinopril-hydrochlorothiazide (PRINZIDE,ZESTORETIC) 20-12.5 MG tablet Take 1 tablet by mouth daily. Patient is overdue for an appointment. Please call and schedule for further refills 90 tablet 0  . Melatonin 10 MG TABS Take 10-20 mg by mouth at bedtime as needed (sleep).     . methylPREDNISolone (MEDROL DOSEPAK) 4 MG TBPK tablet Take as directed on packet (Patient taking differently: Take 4 mg by mouth See admin instructions. Take as directed on package - tapered course) 21 tablet 0  . Multiple Vitamin (MULTIVITAMIN WITH MINERALS) TABS tablet Take 1 tablet by mouth daily. Centrum    . mupirocin ointment (BACTROBAN) 2 % Apply 1 application topically 2 (two) times daily as needed (wound care/ eczema).    . naloxegol oxalate (MOVANTIK) 25 MG TABS tablet Take 25 mg by mouth 2 (two) times daily.    . naproxen sodium (ALEVE) 220 MG tablet Take 660 mg by mouth daily as needed (pain).    . Omega-3 Fatty Acids (FISH OIL) 1000 MG CAPS Take 1,000 mg by mouth daily.    Marland Kitchen oxyCODONE-acetaminophen (PERCOCET) 10-325 MG tablet Take 1 tablet by mouth every 4 (four) hours as needed for pain.     . prednisoLONE acetate (PRED FORTE) 1 % ophthalmic suspension Place 1 drop into the left eye 3 (three) times daily.    Marland Kitchen HYDROcodone-acetaminophen (NORCO/VICODIN) 5-325 MG tablet Take 1-2 tablets by mouth every 6 (six) hours as needed. (Patient not taking: Reported on 02/21/2016) 10 tablet 0  . metoprolol succinate (TOPROL XL) 25 MG 24 hr tablet Take 1 tablet (25 mg total) by mouth daily. (Patient not taking: Reported on 02/21/2016) 90 tablet 1    Home: [place] expects to be  discharged to:: Private residence Living Arrangements: Spouse/significant other (girlfriend) Available Help at Discharge: Available 24 hours/day (girlfriend) Type of Home: House Home Access: Stairs to enter Technical brewer of Steps: 1+1 Entrance Stairs-Rails: None Home Layout: Two level, Able to live on main level with bedroom/bathroom Bathroom Shower/Tub: Multimedia programmer: Standard Home Equipment: None   Functional History: Prior Function Level of Independence: Independent  Functional Status:  Mobility: Bed Mobility Overal bed mobility: Needs Assistance Bed Mobility: Rolling, Sidelying to Sit Rolling: Mod assist Sidelying to sit: Mod assist General bed mobility comments: instructed in log roll technique Transfers Overall transfer level: Needs assistance Equipment used: Rolling walker (2 wheeled) Transfers: Sit to/from Stand Sit to Stand: Mod assist, From elevated surface General transfer comment: assist to rise and steady, increased time, cues for hand placement Ambulation/Gait Ambulation/Gait assistance: Min assist, Mod assist Ambulation Distance (Feet): 70 Feet Assistive device: Rolling walker (2 wheeled) Gait Pattern/deviations: Step-through pattern General Gait Details: Weak-kneed gait with heavy use of the RW. Gait velocity: slow Gait velocity interpretation: Below normal speed  for age/gender    ADL: ADL Overall ADL's : Needs assistance/impaired Eating/Feeding: Set up, Sitting Grooming: Set up, Sitting Upper Body Bathing: Moderate assistance, Sitting Lower Body Bathing: Total assistance, Sit to/from stand Upper Body Dressing : Maximal assistance, Sitting Upper Body Dressing Details (indicate cue type and reason): began educating in donning back brace Lower Body Dressing: Total assistance, Sit to/from stand Toilet Transfer: Moderate assistance, RW, Ambulation Toileting- Clothing Manipulation and Hygiene: Total assistance, Sit to/from  stand Functional mobility during ADLs: Moderate assistance, Rolling walker, Cueing for sequencing, Cueing for safety  Cognition: Cognition Overall Cognitive Status: Impaired/Different from baseline Orientation Level: Oriented X4 Cognition Arousal/Alertness: Awake/alert Behavior During Therapy: WFL for tasks assessed/performed Overall Cognitive Status: Impaired/Different from baseline Area of Impairment: Orientation, Attention, Memory, Safety/judgement, Problem solving, Following commands Orientation Level: Disoriented to, Time Current Attention Level: Selective Memory: Decreased short-term memory Following Commands: Follows one step commands with increased time Safety/Judgement: Decreased awareness of safety, Decreased awareness of deficits Problem Solving: Slow processing, Difficulty sequencing, Requires verbal cues, Requires tactile cues  Physical Exam: Blood pressure 137/79, pulse (!) 103, temperature 98.4 F (36.9 C), temperature source Oral, resp. rate 18, height 6\' 1"  (1.854 m), weight 96.5 kg (212 lb 11.9 oz), SpO2 98 %. Physical Exam  Vitals reviewed. Constitutional: He appears well-developed. No distress.  HENT:  Head: Normocephalic and atraumatic.  Eyes: EOM are normal. Right eye exhibits no discharge. Left eye exhibits no discharge.  Neck: Normal range of motion. Neck supple. No tracheal deviation present. No thyromegaly present.  Cardiovascular: Exam reveals no friction rub.   No murmur heard. Cardiac rate controlled  Respiratory: Effort normal and breath sounds normal. No respiratory distress. He has no wheezes. He has no rales. He exhibits no tenderness.  GI: Soft. Bowel sounds are normal. He exhibits no distension. There is no tenderness.  Musculoskeletal:  Wearing LSO, back tender with truncal movement and to palpation.   Skin: Skin is warm. He is not diaphoretic.  Psychiatric:  Cooperative. A bit impulsive  Skin.Warm and dry Neurological: He is alert.  He is  oriented to person, date of birth in place. Patient more appropriate. Had recall of events from this morning. Aware that he's coming to rehab today.  Motor strength is 5/5 bilateral deltoid, biceps, tricesp, wrist, hand intrinsics 4-/5 bilateral hip flexors, knee extensors, ankle dorsiflexor is 3/5 on the right and 4/5 on the left Sensation intact to light touch bilateral lower limbs to pain and light touch. DTR's 1+   Results for orders placed or performed during the hospital encounter of 02/21/16 (from the past 48 hour(s))  CBC with Differential/Platelet     Status: Abnormal   Collection Time: 02/26/16  4:25 PM  Result Value Ref Range   WBC 11.0 (H) 4.0 - 10.5 K/uL   RBC 3.88 (L) 4.22 - 5.81 MIL/uL   Hemoglobin 12.4 (L) 13.0 - 17.0 g/dL   HCT 35.8 (L) 39.0 - 52.0 %   MCV 92.3 78.0 - 100.0 fL   MCH 32.0 26.0 - 34.0 pg   MCHC 34.6 30.0 - 36.0 g/dL   RDW 14.3 11.5 - 15.5 %   Platelets 256 150 - 400 K/uL   Neutrophils Relative % 87 %   Neutro Abs 9.6 (H) 1.7 - 7.7 K/uL   Lymphocytes Relative 5 %   Lymphs Abs 0.6 (L) 0.7 - 4.0 K/uL   Monocytes Relative 8 %   Monocytes Absolute 0.8 0.1 - 1.0 K/uL   Eosinophils Relative 0 %  Eosinophils Absolute 0.0 0.0 - 0.7 K/uL   Basophils Relative 0 %   Basophils Absolute 0.0 0.0 - 0.1 K/uL  CBC     Status: Abnormal   Collection Time: 02/27/16  3:02 AM  Result Value Ref Range   WBC 9.8 4.0 - 10.5 K/uL   RBC 3.85 (L) 4.22 - 5.81 MIL/uL   Hemoglobin 12.2 (L) 13.0 - 17.0 g/dL   HCT 35.4 (L) 39.0 - 52.0 %   MCV 91.9 78.0 - 100.0 fL   MCH 31.7 26.0 - 34.0 pg   MCHC 34.5 30.0 - 36.0 g/dL   RDW 14.3 11.5 - 15.5 %   Platelets 276 150 - 400 K/uL  Heparin level (unfractionated)     Status: None   Collection Time: 02/27/16  3:02 AM  Result Value Ref Range   Heparin Unfractionated 0.35 0.30 - 0.70 IU/mL    Comment:        IF HEPARIN RESULTS ARE BELOW EXPECTED VALUES, AND PATIENT DOSAGE HAS BEEN CONFIRMED, SUGGEST FOLLOW UP TESTING OF  ANTITHROMBIN III LEVELS.   CBC     Status: Abnormal   Collection Time: 02/28/16  5:48 AM  Result Value Ref Range   WBC 9.5 4.0 - 10.5 K/uL   RBC 3.95 (L) 4.22 - 5.81 MIL/uL   Hemoglobin 12.3 (L) 13.0 - 17.0 g/dL   HCT 37.2 (L) 39.0 - 52.0 %   MCV 94.2 78.0 - 100.0 fL   MCH 31.1 26.0 - 34.0 pg   MCHC 33.1 30.0 - 36.0 g/dL   RDW 14.5 11.5 - 15.5 %   Platelets 315 150 - 400 K/uL   Dg Chest Port 1 View  Result Date: 02/27/2016 CLINICAL DATA:  Status post right-sided PICC line insertion. EXAM: PORTABLE CHEST 1 VIEW COMPARISON:  Portable chest x-ray of February 21, 2016 FINDINGS: The right-sided PICC line tip projects at the junction of middle and distal portions of the SVC. There is no postprocedure pneumothorax. The lungs are mildly hypoinflated. The interstitial markings are increased but have improved since the study of 6 days ago. The cardiac silhouette remains enlarged. The pulmonary vascularity is less engorged. The bony thorax exhibits no acute abnormality. IMPRESSION: Reasonable positioning of the PICC line with the tip at the junction of the middle and distal portions of the SVC. No postplacement complication. Electronically Signed   By: David  Martinique M.D.   On: 02/27/2016 12:40       Medical Problem List and Plan: 1.  Decreased functional mobility and cognitive deficits secondary to encephalopathy/ bacteremia/discitis L4-5, L5-S1. Lumbar corset applied in sitting position  -admit to inpatient rehab 2.  DVT Prophylaxis/Anticoagulation: Eliquis 3. Pain Management: Lidoderm patch. Oxycodone as needed 4. Mood: Provide emotional support 5. Neuropsych: This patient is capable of making decisions on his own behalf. 6. Skin/Wound Care: Routine skin checks 7. Fluids/Electrolytes/Nutrition: Routine I&O with follow-up chemistries 8. Infectious disease: plan is for 6 weeks penicillin G per infectious disease  -TEE today 9. New-onset atrial fibrillation as well as history of CAD with  stenting. Cardizem 90 mg every 6 hours, Lopressor 125 mg twice a day. Cardiac rate control. Follow-up cardiology services  -monitor for exercise tolerance with PT, check VS as needed 10. Hypertension. Lisinopril 20 mg daily and monitor with increased mobility 11. Hyperlipidemia. Lipitor 12. Constipation. Laxative assistance  Post Admission Physician Evaluation: 1. Functional deficits secondary  to lumbar discitis/bacteremia and encephalopathy. 2. Patient is admitted to receive collaborative, interdisciplinary care between the physiatrist, rehab nursing staff,  and therapy team. 3. Patient's level of medical complexity and substantial therapy needs in context of that medical necessity cannot be provided at a lesser intensity of care such as a SNF. 4. Patient has experienced substantial functional loss from his/her baseline which was documented above under the "Functional History" and "Functional Status" headings.  Judging by the patient's diagnosis, physical exam, and functional history, the patient has potential for functional progress which will result in measurable gains while on inpatient rehab.  These gains will be of substantial and practical use upon discharge  in facilitating mobility and self-care at the household level. 5. Physiatrist will provide 24 hour management of medical needs as well as oversight of the therapy plan/treatment and provide guidance as appropriate regarding the interaction of the two. 6. The Preadmission Screening has been reviewed and patient status is unchanged unless otherwise stated above. 7. 24 hour rehab nursing will assist with bladder management, bowel management, safety, skin/wound care, disease management, medication administration, pain management and patient education  and help integrate therapy concepts, techniques,education, etc. 8. PT will assess and treat for/with: Lower extremity strength, range of motion, stamina, balance, functional mobility, safety,  adaptive techniques and equipment, ortho precautions, don/doff of corset, pain control, NMR.   Goals are: supervision. 9. OT will assess and treat for/with: ADL's, functional mobility, safety, upper extremity strength, adaptive techniques and equipment, NMR, don/doff of LSO, pain control, family education.   Goals are: mod I to min assist. Therapy may  proceed with showering this patient. 10. SLP will assess and treat for/with: cognition, communication, education.  Goals are: mod I. 11. Case Management and Social Worker will assess and treat for psychological issues and discharge planning. 12. Team conference will be held weekly to assess progress toward goals and to determine barriers to discharge. 13. Patient will receive at least 3 hours of therapy per day at least 5 days per week. 14. ELOS: 13-17 days       15. Prognosis:  excellent     Meredith Staggers, MD, [facility] 02/28/2016  Cathlyn Parsons., PA-C 02/28/2016

## 2016-02-28 NOTE — Progress Notes (Addendum)
Subjective:  No new complaints.    Antibiotics:  Anti-infectives    Start     Dose/Rate Route Frequency Ordered Stop   02/28/16 0000  penicillin G IVPB  Status:  Discontinued     24 Million Units Intravenous Every 24 hours 02/28/16 1013 02/28/16    02/28/16 0000  penicillin G IVPB     24 Million Units Intravenous Every 24 hours 02/28/16 1039     02/27/16 0900  penicillin G potassium 4 Million Units in dextrose 5 % 250 mL IVPB  Status:  Discontinued     4 Million Units 250 mL/hr over 60 Minutes Intravenous Every 4 hours 02/27/16 0844 02/27/16 0847   02/27/16 0900  penicillin G potassium 4 Million Units in dextrose 5 % 250 mL IVPB     4 Million Units 250 mL/hr over 60 Minutes Intravenous Every 4 hours 02/27/16 0847     02/26/16 1800  penicillin G potassium 4 Million Units in dextrose 5 % 250 mL IVPB  Status:  Discontinued     4 Million Units 250 mL/hr over 60 Minutes Intravenous Every 4 hours 02/26/16 1702 02/27/16 0844   02/22/16 2130  vancomycin (VANCOCIN) 1,250 mg in sodium chloride 0.9 % 250 mL IVPB  Status:  Discontinued     1,250 mg 166.7 mL/hr over 90 Minutes Intravenous Every 12 hours 02/22/16 1229 02/22/16 1626   02/22/16 1800  cefTRIAXone (ROCEPHIN) 2 g in dextrose 5 % 50 mL IVPB  Status:  Discontinued     2 g 100 mL/hr over 30 Minutes Intravenous Every 24 hours 02/22/16 1626 02/26/16 1702   02/21/16 2200  oseltamivir (TAMIFLU) capsule 75 mg  Status:  Discontinued     75 mg Oral 2 times daily 02/21/16 1516 02/22/16 1407   02/21/16 1800  vancomycin (VANCOCIN) IVPB 1000 mg/200 mL premix  Status:  Discontinued     1,000 mg 200 mL/hr over 60 Minutes Intravenous Every 8 hours 02/21/16 1138 02/22/16 1229   02/21/16 1800  piperacillin-tazobactam (ZOSYN) IVPB 3.375 g  Status:  Discontinued     3.375 g 12.5 mL/hr over 240 Minutes Intravenous Every 8 hours 02/21/16 1138 02/22/16 1626   02/21/16 1015  piperacillin-tazobactam (ZOSYN) IVPB 3.375 g     3.375 g 100 mL/hr  over 30 Minutes Intravenous  Once 02/21/16 1002 02/21/16 1100   02/21/16 1015  vancomycin (VANCOCIN) IVPB 1000 mg/200 mL premix     1,000 mg 200 mL/hr over 60 Minutes Intravenous  Once 02/21/16 1002 02/21/16 1132   02/21/16 0730  oseltamivir (TAMIFLU) capsule 75 mg     75 mg Oral  Once 02/21/16 7672 02/21/16 0802      Medications: Scheduled Meds: . Alirocumab  150 mg Subcutaneous Q14 Days  . apixaban  5 mg Oral BID  . atorvastatin  40 mg Oral Daily  . diltiazem  90 mg Oral Q6H  . famotidine  20 mg Oral BID  . folic acid  1 mg Oral Daily  . lidocaine  2 patch Transdermal Q24H  . lisinopril  20 mg Oral Daily  . mouth rinse  15 mL Mouth Rinse BID  . metoprolol tartrate  125 mg Oral BID  . multivitamin with minerals  1 tablet Oral Daily  . pantoprazole  40 mg Oral Q1200  . pencillin G potassium IV  4 Million Units Intravenous Q4H  . pneumococcal 23 valent vaccine  0.5 mL Intramuscular Tomorrow-1000  . prednisoLONE acetate  1 drop Left  Eye TID  . sodium chloride flush  10-40 mL Intracatheter Q12H  . thiamine  100 mg Oral Daily   Continuous Infusions:  PRN Meds:.acetaminophen **OR** acetaminophen, alum & mag hydroxide-simeth, gi cocktail, haloperidol lactate, hydrALAZINE, loperamide, Melatonin, metoprolol, ondansetron **OR** ondansetron (ZOFRAN) IV, oxyCODONE-acetaminophen, polyethylene glycol, sodium chloride flush    Objective: Weight change: 3.5 oz (0.1 kg)  Intake/Output Summary (Last 24 hours) at 02/28/16 1432 Last data filed at 02/28/16 0400  Gross per 24 hour  Intake             1230 ml  Output             1525 ml  Net             -295 ml   Blood pressure 130/78, pulse 81, temperature 98.5 F (36.9 C), temperature source Oral, resp. rate 18, height 6' 1"  (1.854 m), weight 212 lb 11.9 oz (96.5 kg), SpO2 97 %. Temp:  [97.9 F (36.6 C)-98.6 F (37 C)] 98.5 F (36.9 C) (02/09 1253) Pulse Rate:  [77-107] 81 (02/09 1300) Resp:  [16-25] 18 (02/09 1300) BP:  (105-141)/(46-103) 130/78 (02/09 1300) SpO2:  [94 %-98 %] 97 % (02/09 1300) Weight:  [212 lb 11.9 oz (96.5 kg)] 212 lb 11.9 oz (96.5 kg) (02/09 0500)  Physical Exam: General: Alert and awake, oriented x He was walking with physical therapy but lost some balance when he was trying to turn to answer my questions. He recovered without incident.  Neuro: nonfocal  CBC:  CBC Latest Ref Rng & Units 02/28/2016 02/27/2016 02/26/2016  WBC 4.0 - 10.5 K/uL 9.5 9.8 11.0(H)  Hemoglobin 13.0 - 17.0 g/dL 12.3(L) 12.2(L) 12.4(L)  Hematocrit 39.0 - 52.0 % 37.2(L) 35.4(L) 35.8(L)  Platelets 150 - 400 K/uL 315 276 256      BMET No results for input(s): NA, K, CL, CO2, GLUCOSE, BUN, CREATININE, CALCIUM in the last 72 hours.   Liver Panel  No results for input(s): PROT, ALBUMIN, AST, ALT, ALKPHOS, BILITOT, BILIDIR, IBILI in the last 72 hours.     Sedimentation Rate  Recent Labs  02/26/16 0235  ESRSEDRATE 82*   C-Reactive Protein  Recent Labs  02/26/16 0235  CRP 11.4*    Micro Results: Recent Results (from the past 720 hour(s))  Blood Culture (routine x 2)     Status: Abnormal (Preliminary result)   Collection Time: 02/21/16  6:09 AM  Result Value Ref Range Status   Specimen Description BLOOD RIGHT ARM  Final   Special Requests BOTTLES DRAWN AEROBIC AND ANAEROBIC 5ML  Final   Culture  Setup Time   Final    GRAM POSITIVE COCCI IN CHAINS IN BOTH AEROBIC AND ANAEROBIC BOTTLES CRITICAL RESULT CALLED TO, READ BACK BY AND VERIFIED WITH: E MARTIN,PHARMD AT 1600 02/22/16 BY J FUDESCO    Culture (A)  Final    STREPTOCOCCUS INTERMEDIUS SUSCEPTIBILITIES PERFORMED ON PREVIOUS CULTURE WITHIN THE LAST 5 DAYS.    Report Status PENDING  Incomplete  Blood Culture ID Panel (Reflexed)     Status: Abnormal   Collection Time: 02/21/16  6:09 AM  Result Value Ref Range Status   Enterococcus species NOT DETECTED NOT DETECTED Final   Listeria monocytogenes NOT DETECTED NOT DETECTED Final    Staphylococcus species NOT DETECTED NOT DETECTED Final   Staphylococcus aureus NOT DETECTED NOT DETECTED Final   Streptococcus species DETECTED (A) NOT DETECTED Final    Comment: Not Enterococcus species, Streptococcus agalactiae, Streptococcus pyogenes, or Streptococcus pneumoniae. CRITICAL RESULT CALLED  TO, READ BACK BY AND VERIFIED WITH: E. MARTIN, 02/22/16 AT 1600 BY J FUDESCO    Streptococcus agalactiae NOT DETECTED NOT DETECTED Final   Streptococcus pneumoniae NOT DETECTED NOT DETECTED Final   Streptococcus pyogenes NOT DETECTED NOT DETECTED Final   Acinetobacter baumannii NOT DETECTED NOT DETECTED Final   Enterobacteriaceae species NOT DETECTED NOT DETECTED Final   Enterobacter cloacae complex NOT DETECTED NOT DETECTED Final   Escherichia coli NOT DETECTED NOT DETECTED Final   Klebsiella oxytoca NOT DETECTED NOT DETECTED Final   Klebsiella pneumoniae NOT DETECTED NOT DETECTED Final   Proteus species NOT DETECTED NOT DETECTED Final   Serratia marcescens NOT DETECTED NOT DETECTED Final   Haemophilus influenzae NOT DETECTED NOT DETECTED Final   Neisseria meningitidis NOT DETECTED NOT DETECTED Final   Pseudomonas aeruginosa NOT DETECTED NOT DETECTED Final   Candida albicans NOT DETECTED NOT DETECTED Final   Candida glabrata NOT DETECTED NOT DETECTED Final   Candida krusei NOT DETECTED NOT DETECTED Final   Candida parapsilosis NOT DETECTED NOT DETECTED Final   Candida tropicalis NOT DETECTED NOT DETECTED Final  Blood Culture (routine x 2)     Status: Abnormal   Collection Time: 02/21/16  6:17 AM  Result Value Ref Range Status   Specimen Description BLOOD LEFT ARM  Final   Special Requests BOTTLES DRAWN AEROBIC AND ANAEROBIC 5ML  Final   Culture  Setup Time   Final    GRAM POSITIVE COCCI IN CHAINS ANAEROBIC BOTTLE ONLY CRITICAL VALUE NOTED.  VALUE IS CONSISTENT WITH PREVIOUSLY REPORTED AND CALLED VALUE.    Culture STREPTOCOCCUS INTERMEDIUS (A)  Final   Report Status 02/25/2016  FINAL  Final   Organism ID, Bacteria STREPTOCOCCUS INTERMEDIUS  Final      Susceptibility   Streptococcus intermedius - MIC*    PENICILLIN <=0.06 SENSITIVE Sensitive     CEFTRIAXONE 0.25 SENSITIVE Sensitive     ERYTHROMYCIN <=0.12 SENSITIVE Sensitive     LEVOFLOXACIN 0.5 SENSITIVE Sensitive     VANCOMYCIN 0.5 SENSITIVE Sensitive     * STREPTOCOCCUS INTERMEDIUS  Urine culture     Status: Abnormal   Collection Time: 02/21/16  6:26 AM  Result Value Ref Range Status   Specimen Description URINE, RANDOM  Final   Special Requests NONE  Final   Culture <10,000 COLONIES/mL INSIGNIFICANT GROWTH (A)  Final   Report Status 02/22/2016 FINAL  Final  Respiratory Panel by PCR     Status: None   Collection Time: 02/21/16  1:20 PM  Result Value Ref Range Status   Adenovirus NOT DETECTED NOT DETECTED Final   Coronavirus 229E NOT DETECTED NOT DETECTED Final   Coronavirus HKU1 NOT DETECTED NOT DETECTED Final   Coronavirus NL63 NOT DETECTED NOT DETECTED Final   Coronavirus OC43 NOT DETECTED NOT DETECTED Final   Metapneumovirus NOT DETECTED NOT DETECTED Final   Rhinovirus / Enterovirus NOT DETECTED NOT DETECTED Final   Influenza A NOT DETECTED NOT DETECTED Final   Influenza B NOT DETECTED NOT DETECTED Final   Parainfluenza Virus 1 NOT DETECTED NOT DETECTED Final   Parainfluenza Virus 2 NOT DETECTED NOT DETECTED Final   Parainfluenza Virus 3 NOT DETECTED NOT DETECTED Final   Parainfluenza Virus 4 NOT DETECTED NOT DETECTED Final   Respiratory Syncytial Virus NOT DETECTED NOT DETECTED Final   Bordetella pertussis NOT DETECTED NOT DETECTED Final   Chlamydophila pneumoniae NOT DETECTED NOT DETECTED Final   Mycoplasma pneumoniae NOT DETECTED NOT DETECTED Final  MRSA PCR Screening     Status:  None   Collection Time: 02/21/16  8:43 PM  Result Value Ref Range Status   MRSA by PCR NEGATIVE NEGATIVE Final    Comment:        The GeneXpert MRSA Assay (FDA approved for NASAL specimens only), is one component  of a comprehensive MRSA colonization surveillance program. It is not intended to diagnose MRSA infection nor to guide or monitor treatment for MRSA infections.   Stool culture (children & immunocomp patients)     Status: None   Collection Time: 02/22/16  9:31 PM  Result Value Ref Range Status   Salmonella/Shigella Screen Final report  Corrected    Comment: CORRECTED ON 02/07 AT 1636: PREVIOUSLY REPORTED AS Preliminary report   Campylobacter Culture Final report  Final   E coli, Shiga toxin Assay Negative Negative Final    Comment: (NOTE) Performed At: [facility] [place], [place] [id number] Lindon Romp MD SR:[id number]   C difficile quick scan w PCR reflex     Status: None   Collection Time: 02/22/16  9:31 PM  Result Value Ref Range Status   C Diff antigen NEGATIVE NEGATIVE Final   C Diff toxin NEGATIVE NEGATIVE Final   C Diff interpretation No C. difficile detected.  Final  STOOL CULTURE REFLEX - RSASHR     Status: None   Collection Time: 02/22/16  9:31 PM  Result Value Ref Range Status   Stool Culture result 1 (RSASHR) Comment  Final    Comment: (NOTE) No Salmonella or Shigella recovered. Performed At: [facility] [address] [place], [place] [id number] Lindon Romp MD [id number]   STOOL CULTURE Reflex - CMPCXR     Status: None   Collection Time: 02/22/16  9:31 PM  Result Value Ref Range Status   Stool Culture result 1 (CMPCXR) Comment  Final    Comment: (NOTE) No Campylobacter species isolated. Performed At: [facility] [place], [place] [id number] Lindon Romp MD [id number]   Culture, blood (Routine X 2) w Reflex to ID Panel     Status: None (Preliminary result)   Collection Time: 02/24/16  7:39 AM  Result Value Ref Range Status   Specimen Description BLOOD RIGHT HAND  Final   Special Requests BOTTLES DRAWN AEROBIC AND ANAEROBIC 5CC  Final   Culture NO GROWTH 4 DAYS  Final   Report  Status PENDING  Incomplete  Culture, blood (routine x 2)     Status: None (Preliminary result)   Collection Time: 02/24/16  7:46 AM  Result Value Ref Range Status   Specimen Description BLOOD LEFT HAND  Final   Special Requests BOTTLES DRAWN AEROBIC AND ANAEROBIC 5CC  Final   Culture NO GROWTH 4 DAYS  Final   Report Status PENDING  Incomplete    Studies/Results: Dg Chest Port 1 View  Result Date: 02/27/2016 CLINICAL DATA:  Status post right-sided PICC line insertion. EXAM: PORTABLE CHEST 1 VIEW COMPARISON:  Portable chest x-ray of February 21, 2016 FINDINGS: The right-sided PICC line tip projects at the junction of middle and distal portions of the SVC. There is no postprocedure pneumothorax. The lungs are mildly hypoinflated. The interstitial markings are increased but have improved since the study of 6 days ago. The cardiac silhouette remains enlarged. The pulmonary vascularity is less engorged. The bony thorax exhibits no acute abnormality. IMPRESSION: Reasonable positioning of the PICC line with the tip at the junction of the middle and distal portions of the SVC. No postplacement complication.  Electronically Signed   By: David  Martinique M.D.   On: 02/27/2016 12:40      Assessment/Plan:  INTERVAL HISTORY:   MRI report that was scanned in does not mention disk space infection though Dr. 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 male with  Streptococcal bacteremia  and diskitis L4-L5, L5-S1   Dr. Christella Noa does not that there is need for neurosurgical intervention. TEE is negative for vegetations  We already have the pathogen which is Streptococcus intermedius which is quite PCN sensitive  I suspect the source of this infection is likely odontogenic.  I have reviewed CT MF with radiology and there is a little bit of LUCENCY around the right hand side of the posterior molar mandible. It is not esp remarkable per Dr. Collins [place] reviews of  Films  Would for him seeing a dentist at some point after he is discharged  I am going to treat him for 6 weeks with  Of  high-dose penicillin regardless given concern for diskitis  Diagnosis: Bacteremia, Diskitis   Culture Result: Streptococcus intermedius  Allergies  Allergen Reactions  . Other Other (See Comments)    Pt reports allergic reaction to melons - unknown reaction  . Ciprofloxacin Hives    Discharge antibiotics: 24 million units of Penicillin daily via continuous infusion  Duration: 6 weeks  End Date:  04/05/2016  [facility] Per Protocol:  Labs weekly while on IV antibiotics: _x_ CBC with differential _x_ BMP _x_ CRP _x_ ESR   x__ Please pull PIC at completion of IV antibiotics __ Please leave PIC in place until doctor has seen patient or been notified  Fax weekly labs to [phone]  Clinic Follow Up Appt:   He has an appointment with Dr. Baxter Flattery on March 14.  I will sign off for now please call back with further questions.      LOS: 7 days   Alcide Evener 02/28/2016, 2:32 PM

## 2016-02-29 ENCOUNTER — Inpatient Hospital Stay (HOSPITAL_COMMUNITY): Payer: Medicare Other | Admitting: Occupational Therapy

## 2016-02-29 ENCOUNTER — Inpatient Hospital Stay (HOSPITAL_COMMUNITY): Payer: Medicare Other | Admitting: Physical Therapy

## 2016-02-29 ENCOUNTER — Inpatient Hospital Stay (HOSPITAL_COMMUNITY): Payer: Medicare Other | Admitting: Speech Pathology

## 2016-02-29 ENCOUNTER — Encounter (HOSPITAL_COMMUNITY): Payer: Self-pay | Admitting: *Deleted

## 2016-02-29 DIAGNOSIS — D62 Acute posthemorrhagic anemia: Secondary | ICD-10-CM

## 2016-02-29 DIAGNOSIS — I4891 Unspecified atrial fibrillation: Secondary | ICD-10-CM

## 2016-02-29 DIAGNOSIS — E46 Unspecified protein-calorie malnutrition: Secondary | ICD-10-CM

## 2016-02-29 DIAGNOSIS — I10 Essential (primary) hypertension: Secondary | ICD-10-CM

## 2016-02-29 DIAGNOSIS — E8809 Other disorders of plasma-protein metabolism, not elsewhere classified: Secondary | ICD-10-CM

## 2016-02-29 LAB — CULTURE, BLOOD (ROUTINE X 2)
CULTURE: NO GROWTH
Culture: NO GROWTH

## 2016-02-29 MED ORDER — PRO-STAT SUGAR FREE PO LIQD
30.0000 mL | Freq: Two times a day (BID) | ORAL | Status: DC
  Administered 2016-02-29 – 2016-03-08 (×9): 30 mL via ORAL
  Filled 2016-02-29 (×17): qty 30

## 2016-02-29 MED ORDER — ORAL CARE MOUTH RINSE
15.0000 mL | Freq: Two times a day (BID) | OROMUCOSAL | Status: DC
  Administered 2016-03-02 – 2016-03-08 (×13): 15 mL via OROMUCOSAL

## 2016-02-29 NOTE — Evaluation (Signed)
Occupational Therapy Assessment and Plan  Patient Details  Name: Jonathan Holden MRN: [id number] Date of Birth: October 08, 1945  OT Diagnosis: acute pain, altered mental status and muscle weakness (generalized) Rehab Potential: Rehab Potential (ACUTE ONLY): Good ELOS: 10-12 days   Today's Date: 02/29/2016 OT Individual Time: 0700-0800 OT Individual Time Calculation (min): 60 min     Problem List:  Patient Active Problem List   Diagnosis Date Noted  . Bacteremia   . Pulmonary hypertension   . Low back pain   . Ileus ([place])   . Endocarditis of native valve   . Infection of intervertebral disc ([place])   . Streptococcal bacteremia   . Lumbar discitis   . Sepsis ([place]) 02/21/2016  . Hyponatremia 02/21/2016  . Atrial fibrillation with RVR ([place]) 02/21/2016  . Back pain 02/21/2016  . Acute encephalopathy 02/21/2016  . CAD (coronary artery disease), native coronary artery 09/19/2014  . Essential hypertension 09/19/2014  . Hyperlipidemia 10/03/2013    Past Medical History:  Past Medical History:  Diagnosis Date  . Arthritis HANDS AND RIGHT SHOULDER  . Coronary artery disease CARDIOLOGIST- DR Daneen Schick- LAST VISIT 6 MON AGO--  REQUESTED NOTE, EKG, ECHO, STRESS TEST   PT DENIES S & S  . H/O gastric ulcer   . History of MI (myocardial infarction) 1992  . Hyperlipidemia   . Hypertension   . Loose body of right shoulder   . S/P coronary angioplasty    Past Surgical History:  Past Surgical History:  Procedure Laterality Date  . CARDIOVASCULAR STRESS TEST  08-22-2008---  DR Daneen Schick   NO ISCHEMIA.   Marland Kitchen CORONARY ANGIOPLASTY  1992  . ERCP W/ SPHICTEROTOMY  12-15-2002   AND STONE EXTRACTION  . LAPAROSCOPIC CHOLECYSTECTOMY  12-16-2002  . LUMBAR LAMINECTOMY  20 YRS AGO   L5    Assessment & Plan Clinical Impression: Jonathan Reimers Linkeris a 71 y.o.right handed malewith history of hypertension, CAD with stenting, previous laminectomy at L4-5 and L5-S1. Per chart review patient lives  with girlfriend. 2 level home with bedroom on main level. Reported to be independent prior to admission. Presented 02/21/2016 with low back pain and altered mental status. He was initially seen in the ED 02/17/2016 given a prescription of steroids and hydrocodone and sent home. A recent MRI as an outpatient showed herniated L3-4 and previous surgical changes. Patient gradually became more confused and returned back to the ED. Found to have A. fib with aVR. Alcohol and urine drug screens negative. Fever 102. WBC of 11,000. Lactic acid unremarkable. Troponin 0.06. Placed on broad-spectrum antibiotics for suspect sepsis. Neurosurgery has been consulted with workup ongoing with MRI reviewed that did not mention disc space infection however discitis was suspect L4-5, L5-S1.Lumbar corset applied in sitting position when up with therapies. Infectious disease consulted pathogen consistent with streptococcal bacteremia and placed on high-dose penicillin 6 weeks.Cardiology consulted to address atrial fibrillation and currently on intravenous heparin and transitioned to Eliquis. Echocardiogram with ejection fraction 60% no wall motion abnormalities. TEE pending. Still with ongoing bouts of confusion but continues to improve . Physical and Occupational therapy evaluationscompleted with recommendations of physical medicine rehabilitation consult. Patient was admitted for a comprehensive rehabilitation program  Patient transferred to CIR on 02/28/2016 .    Patient currently requires min-mod A with basic self-care skills secondary to muscle weakness, decreased cardiorespiratoy endurance, decreased awareness, decreased problem solving, decreased safety awareness, decreased memory and delayed processing and decreased sitting balance and decreased standing balance.  Prior to hospitalization,  patient could complete ADLs/IADLs with independent .  Patient will benefit from skilled intervention to increase independence with basic  self-care skills and increase level of independence with iADL prior to discharge home with care partner.  Anticipate patient will require intermittent supervision and follow up outpatient.  OT - End of Session Activity Tolerance: Tolerates 10 - 20 min activity with multiple rests Endurance Deficit: Yes Endurance Deficit Description: Required rest breaks throughout bathing/dressing session OT Assessment Rehab Potential (ACUTE ONLY): Good OT Patient demonstrates impairments in the following area(s): Balance;Cognition;Endurance;Safety;Pain OT Basic ADL's Functional Problem(s): Grooming;Bathing;Dressing;Toileting OT Transfers Functional Problem(s): Toilet;Tub/Shower OT Additional Impairment(s): None OT Plan OT Intensity: Minimum of 1-2 x/day, 45 to 90 minutes OT Frequency: 5 out of 7 days OT Duration/Estimated Length of Stay: 10-12 days OT Treatment/Interventions: Balance/vestibular training;Cognitive remediation/compensation;Discharge planning;Community reintegration;DME/adaptive equipment instruction;Functional mobility training;Pain management;Patient/family education;Self Care/advanced ADL retraining;Therapeutic Activities;UE/LE Strength taining/ROM;Therapeutic Exercise;UE/LE Coordination activities OT Self Feeding Anticipated Outcome(s): Mod I OT Basic Self-Care Anticipated Outcome(s): Supervision OT Toileting Anticipated Outcome(s): Supervision OT Bathroom Transfers Anticipated Outcome(s): Supervision OT Recommendation Recommendations for Other Services: Neuropsych consult;Therapeutic Recreation consult Therapeutic Recreation Interventions: Pet therapy;Outing/community reintergration Patient destination: Home Follow Up Recommendations: Home health OT;Outpatient OT Equipment Recommended: To be determined   Skilled Therapeutic Intervention Pt seen for OT eval and ADL bathing/dressing session. Pt sitting EOB upon arrival with Rn present assisting with void from EOB. Pt eager to begin tx.   Ambulated throughout room with RW and CGA. Min-mod A to stand from lower surfaces. Required VCs throughout for RW management. He bathed seated on tub bench, assist for LEs. Min VCs for donning technique of LSO, able to recall some previous education regarding donning techniques.  He dressed seated in w/c with steadying assist.  Pt left sitting in recliner at end of session set-up with meal tray, all needs in reach, aware of need to call for nursing assist with mobility.  Pt with mild confusion during session, however, oriented to situation and current deficits.   OT Evaluation Precautions/Restrictions  Precautions Precautions: Fall Required Braces or Orthoses: Spinal Brace Spinal Brace: Lumbar corset;Applied in sitting position Restrictions Weight Bearing Restrictions: No General Chart Reviewed: Yes Pain   No/ denies pain Home Living/Prior Functioning Home Living Available Help at Discharge: Available 24 hours/day Type of Home: House Home Access: Stairs to enter [employer] of Steps: 1+1 Entrance Stairs-Rails: None Home Layout: Two level, Able to live on main level with bedroom/bathroom Bathroom Shower/Tub: Multimedia programmer: Standard Bathroom Accessibility: Yes  Lives With: Significant other Vision/Perception  Vision- History Baseline Vision/History: Wears glasses Wears Glasses: Reading only Patient Visual Report: No change from baseline Vision- Assessment Vision Assessment?: No apparent visual deficits  Cognition Overall Cognitive Status: Impaired/Different from baseline Arousal/Alertness: Awake/alert Orientation Level: Person;Place;Situation Person: Oriented Place: Oriented Situation: Oriented Year: 2018 Month: February Day of Week: Correct Memory: Impaired Memory Impairment: Decreased short term memory;Retrieval deficit Decreased Short Term Memory: Verbal complex;Functional complex Immediate Memory Recall: Sock;Blue;Bed Memory Recall:  Sock;Blue;Bed Memory Recall Sock: Without Cue Memory Recall Blue: Without Cue Memory Recall Bed: Without Cue Attention: Sustained;Selective Sustained Attention: Appears intact Selective Attention: Impaired Selective Attention Impairment: Functional basic;Verbal basic Awareness: Impaired Awareness Impairment: Anticipatory impairment Problem Solving: Impaired Problem Solving Impairment: Functional complex;Verbal basic Executive Function: Decision Making Decision Making: Impaired Behaviors: Impulsive Safety/Judgment: Impaired Comments: Decreaed short term memory and awareness limiting safety awareness.  Sensation Sensation Light Touch: Appears Intact Coordination Gross Motor Movements are Fluid and Coordinated: Yes Fine Motor Movements are Fluid and Coordinated:  Yes Motor  Motor Motor: Within Functional Limits Motor - Skilled Clinical Observations: Generalized weakness Trunk/Postural Assessment  Cervical Assessment Cervical Assessment: Within Functional Limits Thoracic Assessment Thoracic Assessment: Exceptions to WFL (LSO corset) Lumbar Assessment Lumbar Assessment: Exceptions to WFL (LSO Corset) Postural Control Postural Control: Within Functional Limits  Balance Balance Balance Assessed: Yes Static Sitting Balance Static Sitting - Level of Assistance: 5: Stand by assistance Static Sitting - Comment/# of Minutes: Sitting EOB Dynamic Sitting Balance Dynamic Sitting - Balance Support: Feet supported;During functional activity Dynamic Sitting - Level of Assistance: 5: Stand by assistance Static Standing Balance Static Standing - Balance Support: During functional activity Static Standing - Level of Assistance: 4: Min assist Dynamic Standing Balance Dynamic Standing - Balance Support: During functional activity;Right upper extremity supported;Left upper extremity supported Dynamic Standing - Level of Assistance: 4: Min assist Dynamic Standing - Comments: Standing to  complete LB dressing tasks Extremity/Trunk Assessment RUE Assessment RUE Assessment: Exceptions to [facility] (Reports hx of R shoulder problems, appears WFL) LUE Assessment LUE Assessment: Within Functional Limits   See Function Navigator for Current Functional Status.   Refer to Care Plan for Long Term Goals  Recommendations for other services: Therapeutic Recreation  Pet therapy and Outing/community reintegration   Discharge Criteria: Patient will be discharged from OT if patient refuses treatment 3 consecutive times without medical reason, if treatment goals not met, if there is a change in medical status, if patient makes no progress towards goals or if patient is discharged from hospital.  The above assessment, treatment plan, treatment alternatives and goals were discussed and mutually agreed upon: by patient  Ernestina Patches 02/29/2016, 9:34 AM

## 2016-02-29 NOTE — Evaluation (Signed)
Speech Language Pathology Assessment and Plan  Patient Details  Name: Jonathan Holden MRN: [id number] Date of Birth: [birth year]  SLP Diagnosis: Cognitive Impairments  Rehab Potential: Excellent ELOS: 7 to 14 days    Today's Date: 02/29/2016 SLP Individual Time: 2094-7096 SLP Individual Time Calculation (min): 60 min   Problem List:  Patient Active Problem List   Diagnosis Date Noted  . Bacteremia   . Pulmonary hypertension   . Low back pain   . Ileus ([place])   . Endocarditis of native valve   . Infection of intervertebral disc ([place])   . Streptococcal bacteremia   . Lumbar discitis   . Sepsis ([place]) 02/21/2016  . Hyponatremia 02/21/2016  . Atrial fibrillation with RVR ([place]) 02/21/2016  . Back pain 02/21/2016  . Acute encephalopathy 02/21/2016  . CAD (coronary artery disease), native coronary artery 09/19/2014  . Essential hypertension 09/19/2014  . Hyperlipidemia 10/03/2013   Past Medical History:  Past Medical History:  Diagnosis Date  . Arthritis HANDS AND RIGHT SHOULDER  . Coronary artery disease CARDIOLOGIST- DR Daneen Schick- LAST VISIT 6 MON AGO--  REQUESTED NOTE, EKG, ECHO, STRESS TEST   PT DENIES S & S  . H/O gastric ulcer   . History of MI (myocardial infarction) 1992  . Hyperlipidemia   . Hypertension   . Loose body of right shoulder   . S/P coronary angioplasty    Past Surgical History:  Past Surgical History:  Procedure Laterality Date  . CARDIOVASCULAR STRESS TEST  08-22-2008---  DR Daneen Schick   NO ISCHEMIA.   Marland Kitchen CORONARY ANGIOPLASTY  1992  . ERCP W/ SPHICTEROTOMY  12-15-2002   AND STONE EXTRACTION  . LAPAROSCOPIC CHOLECYSTECTOMY  12-16-2002  . LUMBAR LAMINECTOMY  20 YRS AGO   L5    Assessment / Plan / Recommendation Clinical Impression Jonathan Dinino Linkeris a 71 y.o.right handed 71 y.o.right handed malewith 71 y.o.right handed malewith history of hypertension, CAD with stenting, previous laminectomy at L4-5 and L5-S1. Per chart review patient lives with girlfriend. 2 level home with bedroom on  main level. Reported to be independent prior to admission. Presented 02/21/2016 with low back pain and altered mental status. He was initially seen in the ED 02/17/2016 given a prescription of steroids and hydrocodone and sent home. A recent MRI as an outpatient showed herniated L3-4 and previous surgical changes. Patient gradually became more confused and returned back to the ED. Found to have A. fib with aVR. Alcohol and urine drug screens negative. Fever 102. WBC of 11,000. Lactic acid unremarkable. Troponin 0.06. Placed on broad-spectrum antibiotics for suspect sepsis. Neurosurgery has been consulted with workup ongoing with MRI reviewed that did not mention disc space infection however discitis was suspect L4-5, L5-S1.Lumbar corset applied in sitting position when up with therapies. Infectious disease consulted pathogen consistent with streptococcal bacteremia and placed on high-dose penicillin 6 weeks.Cardiology consulted to address atrial fibrillation and currently on intravenous heparin and transitioned to Eliquis. Echocardiogram with ejection fraction 60% no wall motion abnormalities. TEE pending. Still with ongoing bouts of confusion but continues to improve . Physical and Occupational therapy evaluationscompleted with recommendations of physical medicine rehabilitation consult.   Patient was admitted for a comprehensive rehabilitation program on 02/28/16. Cognitive linguistic evaluation was completed on 02/29/16 with mild deficits identified in selective attention, anticipatory awareness, recall of new information as well as complex problem solving in distracting evironment. 71 Pt's scores on the Cognistat were within normal limits but functionally, the above memntioned deficits impeded his ability to return to independence living. Skilled ST is  required ot increased pt's funcitonal ability to independent level prior to discharge. I do not antiicpate that pt will require follow up ST services at time of  discharge.    Skilled Therapeutic Interventions          Skilled treatment session focused on completion of the Cognistat, see above for results. Pt required Mod A verbal cues for selective attention and for completion of complex reasoning tasks.  Pt agreeable to ST goals and plan of care.    SLP Assessment  71 y.o. Patient will need skilled [facility] during CIR admission    Recommendations  Recommendations for Other Services: Neuropsych consult Patient destination: Home Follow up Recommendations: None Equipment Recommended: None recommended by SLP    SLP Frequency 3 to 5 out of 7 days   SLP Duration  SLP Intensity  SLP Treatment/Interventions 7 to 14 days  Minumum of 1-2 x/day, 30 to 90 minutes  Cognitive remediation/compensation;Cueing hierarchy;Environmental controls;Internal/external aids;Therapeutic Activities;Patient/family education;Medication managment;Functional tasks    Pain    Prior Functioning Cognitive/Linguistic Baseline: Within functional limits Type of Home: House  Lives With: Significant other Available Help at Discharge: Available 24 hours/day Vocation: Full time employment  Function:  Eating Eating   Modified Consistency Diet: No Eating Assist Level: More than reasonable amount of time           Cognition Comprehension Comprehension assist level: Follows basic conversation/direction with extra time/assistive device  Expression   Expression assist level: Expresses basic needs/ideas: With no assist  Social Interaction Social Interaction assist level: Interacts appropriately 75 - 89% of the time - Needs redirection for appropriate language or to initiate interaction.  Problem Solving Problem solving assist level: Solves basic problems with no assist  Memory Memory assist level: Recognizes or recalls 75 - 89% of the time/requires cueing 10 - 24% of the time   Short Term Goals: Week 1: SLP Short Term Goal 1 (Week 1): Pt will  itilize external aides for recall of basic, daily as well as new informaiton with Min A verbal cues. SLP Short Term Goal 2 (Week 1): Pt will demonstrate selective attention to task in moderately distracting environment for ~30 Minutes iwth Min A verbal cues for redirections.  SLP Short Term Goal 3 (Week 1): Pt will complete complex medication management tasks with Min A verbal cues.  SLP Short Term Goal 4 (Week 1): Pt will complete complex money management tasks with Min A verbal cues.  SLP Short Term Goal 5 (Week 1): Pt will demonstrate anticipatory awareness by stating 3 activites that pt can safety participate in within home setting.   Refer to Care Plan for Long Term Goals  Recommendations for other services: Neuropsych  Discharge Criteria: Patient will be discharged from SLP if patient refuses treatment 3 consecutive times without medical reason, if treatment goals not met, if there is a change in medical status, if patient makes no progress towards goals or if patient is discharged from hospital.  The above assessment, treatment plan, treatment alternatives and goals were discussed and mutually agreed upon: by patient   Lloyde Ludlam B. Rutherford Nail, M.S., CCC-SLP Speech-Language Pathologist   Shloimy Michalski 02/29/2016, 10:17 AM

## 2016-02-29 NOTE — Progress Notes (Signed)
[facility] PHYSICAL MEDICINE & REHABILITATION     PROGRESS NOTE  Subjective/Complaints:  Pt seen laying in bed this AM. Wife at bedside. Pt states he did not sleep well, but is tangential. Wife notes significant improvement in sleep.    ROS: Denies CP, SOB, N/V/D.  Objective: Vital Signs: Blood pressure 118/72, pulse 85, temperature 98.6 F (37 C), temperature source Oral, resp. rate 18, weight 96.6 kg (212 lb 14.4 oz), SpO2 96 %. Dg Chest Port 1 View  Result Date: 02/27/2016 CLINICAL DATA:  Status post right-sided PICC line insertion. EXAM: PORTABLE CHEST 1 VIEW COMPARISON:  Portable chest x-ray of February 21, 2016 FINDINGS: The right-sided PICC line tip projects at the junction of middle and distal portions of the SVC. There is no postprocedure pneumothorax. The lungs are mildly hypoinflated. The interstitial markings are increased but have improved since the study of 6 days ago. The cardiac silhouette remains enlarged. The pulmonary vascularity is less engorged. The bony thorax exhibits no acute abnormality. IMPRESSION: Reasonable positioning of the PICC line with the tip at the junction of the middle and distal portions of the SVC. No postplacement complication. Electronically Signed   By: David  Martinique M.D.   On: 02/27/2016 12:40    Recent Labs  02/27/16 0302 02/28/16 0548  WBC 9.8 9.5  HGB 12.2* 12.3*  HCT 35.4* 37.2*  PLT 276 315   No results for input(s): NA, K, CL, GLUCOSE, BUN, CREATININE, CALCIUM in the last 72 hours.  Invalid input(s): CO CBG (last 3)  No results for input(s): GLUCAP in the last 72 hours.  Wt Readings from Last 3 Encounters:  02/29/16 96.6 kg (212 lb 14.4 oz)  02/28/16 96.5 kg (212 lb 11.9 oz)  02/17/16 95.3 kg (210 lb)    Physical Exam:  BP 118/72   Pulse 85   Temp 98.6 F (37 C) (Oral)   Resp 18   Wt 96.6 kg (212 lb 14.4 oz)   SpO2 96%   BMI 28.09 kg/m  Constitutional: He appears well-developed. No distress.  HENT: Normocephalicand  atraumatic.  Eyes: EOMI. No discharge.  Cardiovascular: Irregularly irregular. No JVD. Respiratory: Effort normaland breath sounds normal.  GI: Soft. Bowel sounds are normal.  Musculoskeletal:  Wearing LSO, back tender with truncal movement and to palpation.  Skin: Skin is warm. He is not diaphoretic.  Psychiatric:  Cooperative. Impulsive. Tangential Neurological: He is alert and oriented.  Motor: 5/5 bilateral deltoid, biceps, tricesp, wrist, hand intrinsics 4/5 bilateral hip flexors, knee extensors, ankle dorsiflexor  Sensation intact to light touch bilateral lower limbs to light touch.   Assessment/Plan: 1. Functional deficits secondary to discitis and ecephalopathy which require 3+ hours per day of interdisciplinary therapy in a comprehensive inpatient rehab setting. Physiatrist is providing close team supervision and 24 hour management of active medical problems listed below. Physiatrist and rehab team continue to assess barriers to discharge/monitor patient progress toward functional and medical goals.  Function:  Bathing Bathing position   Position: Shower  Bathing parts Body parts bathed by patient: Right arm, Left arm, Chest, Abdomen, Front perineal area, Right upper leg, Left upper leg Body parts bathed by helper: Buttocks, Right lower leg, Left lower leg, Back  Bathing assist Assist Level: Touching or steadying assistance(Pt > 75%)      Upper Body Dressing/Undressing Upper body dressing   What is the patient wearing?: Pull over shirt/dress, Orthosis     Pull over shirt/dress - Perfomed by patient: Thread/unthread right sleeve, Thread/unthread left sleeve, Put  head through opening, Pull shirt over trunk       Orthosis activity level: Performed by helper  Upper body assist Assist Level: Set up, Supervision or verbal cues   Set up : To obtain clothing/put away, To apply TLSO, cervical collar  Lower Body Dressing/Undressing Lower body dressing   What is the  patient wearing?: Underwear, Pants, Non-skid slipper socks Underwear - Performed by patient: Thread/unthread left underwear leg, Pull underwear up/down Underwear - Performed by helper: Thread/unthread right underwear leg Pants- Performed by patient: Thread/unthread right pants leg, Thread/unthread left pants leg, Pull pants up/down, Fasten/unfasten pants     Non-skid slipper socks- Performed by helper: Don/doff right sock, Don/doff left sock                  Lower body assist Assist for lower body dressing: Touching or steadying assistance (Pt > 75%)      Toileting Toileting   Toileting steps completed by patient: Adjust clothing prior to toileting, Adjust clothing after toileting Toileting steps completed by helper: Performs perineal hygiene    Toileting assist Assist level: Touching or steadying assistance (Pt.75%)   Transfers Chair/bed Clinical biochemist          Cognition Comprehension Comprehension assist level: Follows basic conversation/direction with extra time/assistive device  Expression Expression assist level: Expresses basic needs/ideas: With no assist  Social Interaction Social Interaction assist level: Interacts appropriately 75 - 89% of the time - Needs redirection for appropriate language or to initiate interaction.  Problem Solving Problem solving assist level: Solves basic problems with no assist  Memory Memory assist level: Recognizes or recalls 75 - 89% of the time/requires cueing 10 - 24% of the time    Medical Problem List and Plan: 1. 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 Lumbar corset applied in sitting position  Records reviewed. Imaging reviewed, will monitor for URI symptoms due to ?sinusitis.   Begin CIR 2. DVT Prophylaxis/Anticoagulation: Eliquis 3. Pain Management:Lidoderm patch. Oxycodone as needed 4. Mood:  Provide emotional support 5. Neuropsych: This patient is not capable of making decisions on hisown behalf. 6. Skin/Wound Care: Routine skin checks 7. Fluids/Electrolytes/Nutrition: Routine I&Os  Follow BMP 8.Infectious disease: plan is for6 weeks penicillin G per infectious disease  TEE results reviewed, no vegetation.  9.New-onset atrial fibrillation as well as history of CAD with stenting. Cardizem 90 mg every 6 hours, Lopressor 125 mg twice a day. Cardiac rate control. Follow-up cardiology services -monitor for exercise tolerance with PT, check VS as needed 10.Hypertension. Lisinopril 20 mg daily and monitor with increased mobility  Monitor with increased activity 11.Hyperlipidemia. Lipitor 12.Constipation. Laxative assistance 13. ABLA  Hb 12.2 on 2/7  Cont to monitor 14. Hypoalbuminemia  Supplement initiated 2/10   LOS (Days) 1 A FACE TO FACE EVALUATION WAS PERFORMED  Ankit [name] 02/29/2016 10:51 AM

## 2016-02-29 NOTE — Evaluation (Signed)
Physical Therapy Assessment and Plan  Patient Details  Name: Jonathan Holden MRN: [id number] Date of Birth: 1945-08-02  PT Diagnosis: Abnormal posture, Difficulty walking, Impaired cognition and Muscle weakness Rehab Potential: Good ELOS: 10-12   Today's Date: 02/29/2016 PT Individual Time: 1601-0932 PT Individual Time Calculation (min): 72 min    Problem List:  Patient Active Problem List   Diagnosis Date Noted  . Acute blood loss anemia   . Hypoalbuminemia due to protein-calorie malnutrition ([place])   . Benign essential HTN   . New onset a-fib ([place])   . Bacteremia   . Pulmonary hypertension   . Low back pain   . Ileus ([place])   . Endocarditis of native valve   . Infection of intervertebral disc ([place])   . Streptococcal bacteremia   . Lumbar discitis   . Sepsis ([place]) 02/21/2016  . Hyponatremia 02/21/2016  . Atrial fibrillation with RVR ([place]) 02/21/2016  . Back pain 02/21/2016  . Acute encephalopathy 02/21/2016  . CAD (coronary artery disease), native coronary artery 09/19/2014  . Essential hypertension 09/19/2014  . Hyperlipidemia 10/03/2013    Past Medical History:  Past Medical History:  Diagnosis Date  . Arthritis HANDS AND RIGHT SHOULDER  . Coronary artery disease CARDIOLOGIST- DR Daneen Schick- LAST VISIT 6 MON AGO--  REQUESTED NOTE, EKG, ECHO, STRESS TEST   PT DENIES S & S  . H/O gastric ulcer   . History of MI (myocardial infarction) 1992  . Hyperlipidemia   . Hypertension   . Loose body of right shoulder   . S/P coronary angioplasty    Past Surgical History:  Past Surgical History:  Procedure Laterality Date  . CARDIOVASCULAR STRESS TEST  08-22-2008---  DR Daneen Schick   NO ISCHEMIA.   Marland Kitchen CORONARY ANGIOPLASTY  1992  . ERCP W/ SPHICTEROTOMY  12-15-2002   AND STONE EXTRACTION  . LAPAROSCOPIC CHOLECYSTECTOMY  12-16-2002  . LUMBAR LAMINECTOMY  20 YRS AGO   L5    Assessment & Plan Clinical Impression: Patient is a 71 y.o.right handed malewith history of 71 hypertension, CAD with stenting, previous laminectomy at L4-5 and L5-S1. Per chart review patient lives with girlfriend. 2 level home with bedroom on main level. Reported to be independent prior to admission. Presented 02/21/2016 with low back pain and altered mental status. He was initially seen in the ED 02/17/2016 given a prescription of steroids and hydrocodone and sent home. A recent MRI as an outpatient showed herniated L3-4 and previous surgical changes. Patient gradually became more confused and returned back to the ED. Found to have A. fib with aVR. Alcohol and urine drug screens negative. Fever 102. WBC of 11,000. Lactic acid unremarkable. Troponin 0.06. Placed on broad-spectrum antibiotics for suspect sepsis. Neurosurgery has been consulted with workup ongoing with MRI reviewed that did not mention disc space infection however discitis was suspect L4-5, L5-S1.Lumbar corset applied in sitting position when up with therapies. Infectious disease consulted pathogen consistent with streptococcal bacteremia and placed on high-dose penicillin 6 weeks.Cardiology consulted to address atrial fibrillation and currently on intravenous heparin and transitioned to Eliquis. Echocardiogram with ejection fraction 60% no wall motion abnormalities. TEE pending. Still with ongoing bouts of confusion but continues to improve   Patient transferred to CIR on 72/09/2016 .   Patient currently requires mod with mobility secondary to muscle weakness, decreased awareness, decreased problem solving, decreased safety awareness and decreased memory and decreased standing balance, decreased postural control, decreased balance strategies and difficulty maintaining precautions.  Prior to hospitalization, patient  was independent  with mobility and lived with Significant other in a House home.  Home access is 1+1Stairs to enter.  Patient will benefit from skilled PT intervention to maximize safe functional mobility, minimize fall risk  and decrease caregiver burden for planned discharge home with 24 hour supervision.  Anticipate patient will benefit from follow up [place] at discharge.  PT - End of Session Activity Tolerance: Tolerates 30+ min activity with multiple rests Endurance Deficit: Yes Endurance Deficit Description: Required rest breaks throughout bathing/dressing session PT Assessment Rehab Potential (ACUTE/IP ONLY): Good Barriers to Discharge: Decreased caregiver support PT Patient demonstrates impairments in the following area(s): Balance;Behavior;Endurance;Motor;Safety;Skin Integrity PT Transfers Functional Problem(s): Bed Mobility;Bed to Chair;Car;Furniture;Floor PT Locomotion Functional Problem(s): Ambulation;Stairs;Wheelchair Mobility PT Plan PT Intensity: Minimum of 1-2 x/day ,45 to 90 minutes PT Frequency: 5 out of 7 days PT Duration Estimated Length of Stay: 72-12 PT Treatment/Interventions: Ambulation/gait training;Balance/vestibular training;Cognitive remediation/compensation;Community reintegration;Discharge planning;Disease management/prevention;DME/adaptive equipment instruction;Functional mobility training;Neuromuscular re-education;Pain management;Patient/family education;Psychosocial support;Skin care/wound management;Stair training;Splinting/orthotics;Therapeutic Activities;Therapeutic Exercise;UE/LE Strength taining/ROM;UE/LE Coordination activities;Visual/perceptual remediation/compensation;Wheelchair propulsion/positioning PT Transfers Anticipated Outcome(s): Superivsion assist with LRAD  PT Locomotion Anticipated Outcome(s): Supervision assist with LRAD  PT Recommendation Recommendations for Other Services: Neuropsych consult;Therapeutic Recreation consult Follow Up Recommendations: Home health PT Patient destination: Home Equipment Recommended: Rolling walker with 5" wheels;To be determined    Skilled Therapeutic Intervention Pt received supine in bed and agreeable to PT. Supine>sit transfer  with Min-mod assist to control trunk and min cues for sequencing. PT instructed patient in Evaluation and initiated treatment intervention; see below for results. Pt demonstrates overall Mod assist for transfers due to posterior lean and poor sequencing to stand and min assist for gait up to 137f. Following treatment, pt left supine in bed with bed alarm activated and call bell in reach.    PT Evaluation Precautions/Restrictions Precautions Precautions: Fall Required Braces or Orthoses: Spinal Brace Spinal Brace: Lumbar corset;Applied in sitting position Restrictions Weight Bearing Restrictions: No General   Vital SignsTherapy Vitals Pulse Rate: 85 BP: 118/72 Pain Pain Assessment Pain Assessment: 0-10 Pain Score: 3  Pain Type: Surgical pain Pain Location: Back Pain Orientation: Lower Pain Descriptors / Indicators: Dull Patients Stated Pain Goal: 2 Home Living/Prior Functioning Home Living Available Help at Discharge: Available 24 hours/day Type of Home: House Home Access: Stairs to enter ETechnical brewerof Steps: 1+1 Entrance Stairs-Rails: None Home Layout: Two level;Able to live on main level with bedroom/bathroom Bathroom Shower/Tub: WMultimedia programmer Standard Bathroom Accessibility: Yes  Lives With: Significant other Prior Function Level of Independence: Independent with basic ADLs;Independent with homemaking with ambulation;Independent with homemaking with wheelchair  Able to Take Stairs?: Yes Driving: Yes Vocation: Full time employment Vision/Perception     Cognition Overall Cognitive Status: Impaired/Different from baseline Arousal/Alertness: Awake/alert Orientation Level: Oriented X4 Attention: Sustained;Selective Sustained Attention: Appears intact Selective Attention: Impaired Selective Attention Impairment: Functional basic;Verbal basic Memory: Impaired Memory Impairment: Decreased short term memory;Retrieval deficit Decreased Short  Term Memory: Verbal complex;Functional complex Awareness: Impaired Awareness Impairment: Anticipatory impairment Problem Solving: Impaired Problem Solving Impairment: Functional complex;Verbal basic Executive Function: Decision Making Decision Making: Impaired Decision Making Impairment: Verbal complex;Functional complex Behaviors: Impulsive Safety/Judgment: Impaired Comments: Decreaed short term memory and awareness limiting safety awareness.  Sensation Sensation Light Touch: Appears Intact Coordination Gross Motor Movements are Fluid and Coordinated: Yes Fine Motor Movements are Fluid and Coordinated: Yes Heel Shin Test: WPinckney[facility] Community HospitalMotor  Motor Motor: Within Functional Limits Motor - Skilled Clinical Observations: Generalized weakness  Mobility Bed Mobility Bed Mobility: Sit  to Supine;Rolling Right;Rolling Left;Supine to Sit Rolling Right: 4: Min guard Rolling Left: 4: Min guard Supine to Sit: 4: Min assist Sit to Supine: 4: Min assist Transfers Transfers: Yes Sit to Stand: 3: Mod assist Sit to Stand Details: Verbal cues for technique;Verbal cues for precautions/safety Stand Pivot Transfers: 3: Mod assist Stand Pivot Transfer Details: Verbal cues for technique;Verbal cues for precautions/safety Locomotion  Ambulation Ambulation: Yes Ambulation/Gait Assistance: 4: Min assist Ambulation Distance (Feet): 125 Feet Assistive device: Rolling walker Gait Gait: Yes Stairs / Additional Locomotion Stairs: Yes Stair Management Technique: Two rails Number of Stairs: 12 Height of Stairs: 6 (3) Wheelchair Mobility Wheelchair Mobility: Yes Wheelchair Assistance: 5: Investment banker, operational Details: Verbal cues for Information systems manager: Both upper extremities Wheelchair Parts Management: Needs assistance Distance: 159f  Trunk/Postural Assessment  Cervical Assessment Cervical Assessment: Within Functional Limits Thoracic Assessment Thoracic  Assessment: Exceptions to WFL (LSO corset) Lumbar Assessment Lumbar Assessment: Exceptions to WHanover Surgicenter LLCPostural Control Postural Control: Within Functional Limits  Balance Balance Balance Assessed: Yes Static Sitting Balance Static Sitting - Level of Assistance: 5: Stand by assistance Static Sitting - Comment/# of Minutes: Sitting EOB Dynamic Sitting Balance Dynamic Sitting - Balance Support: Feet supported;During functional activity Dynamic Sitting - Level of Assistance: 5: Stand by assistance Static Standing Balance Static Standing - Balance Support: During functional activity Static Standing - Level of Assistance: 4: Min assist Dynamic Standing Balance Dynamic Standing - Balance Support: During functional activity;Right upper extremity supported;Left upper extremity supported Dynamic Standing - Level of Assistance: 4: Min assist Dynamic Standing - Comments: Standing to complete LB dressing tasks Extremity Assessment  RUE Assessment RUE Assessment: Exceptions to [facility](Reports hx of R shoulder problems, appears WFL) LUE Assessment LUE Assessment: Within Functional Limits RLE Assessment RLE Assessment: Within Functional Limits LLE Assessment LLE Assessment: Within Functional Limits   See Function Navigator for Current Functional Status.   Refer to Care Plan for Long Term Goals  Recommendations for other services: Neuropsych and Therapeutic Recreation  Pet therapy  Discharge Criteria: Patient will be discharged from PT if patient refuses treatment 3 consecutive times without medical reason, if treatment goals not met, if there is a change in medical status, if patient makes no progress towards goals or if patient is discharged from hospital.  The above assessment, treatment plan, treatment alternatives and goals were discussed and mutually agreed upon: by patient  ALorie Phenix2/10/2016, 12:59 PM

## 2016-03-01 ENCOUNTER — Inpatient Hospital Stay (HOSPITAL_COMMUNITY): Payer: Medicare Other

## 2016-03-01 DIAGNOSIS — K219 Gastro-esophageal reflux disease without esophagitis: Secondary | ICD-10-CM

## 2016-03-01 MED ORDER — PANTOPRAZOLE SODIUM 40 MG PO TBEC
40.0000 mg | DELAYED_RELEASE_TABLET | Freq: Two times a day (BID) | ORAL | Status: DC
  Administered 2016-03-01 – 2016-03-09 (×15): 40 mg via ORAL
  Filled 2016-03-01 (×16): qty 1

## 2016-03-01 NOTE — Progress Notes (Signed)
A & O to self during night. Thought he was at a doctors's office. 2 attempts to sit on EOB during night. Incontinent of urine X 2, last occurrence, patient refused to have clean pair of underwear put on. When attempting to use urinal, easily distracted, voids outside of urinal. Initially refused scheduled HS meds-"those people will start listening to me, when I stop taking my meds." After a hour agreed to take a few meds." Eventually took all meds with much encouragement.Patrici Ranks A

## 2016-03-01 NOTE — Progress Notes (Signed)
Physical Therapy Session Note  Patient Details  Name: KELLAN BADMAN MRN: [id number] Date of Birth: 1945-07-25  Today's Date: 03/01/2016 PT Individual Time: 1430-1505 PT Individual Time Calculation (min): 35 min   Skilled Therapeutic Interventions/Progress Updates:    Session focused on functional transfers with RW, nustep for general strengthening and endurance (level 4 x 5 min, rest break, and x 3 more min), gait with RW on unit with cues for widening BOS and attention to foot placement, and education with pt and significant other on therapy schedule, primary team, and progression of therapy on CIR. Pt requires cues throughout session for carryover, hand placement during transfers, sequencing, and demonstrates decreased attention during mobility.   Therapy Documentation Precautions:  Precautions Precautions: Fall Required Braces or Orthoses: Spinal Brace Spinal Brace: Lumbar corset, Applied in sitting position Restrictions Weight Bearing Restrictions: No Pain:  reports pain at 5/10 in back and R leg. Repositioned and rest breaks given as needed.   See Function Navigator for Current Functional Status.   Therapy/Group: Individual Therapy  Canary Brim Ivory Broad, PT, DPT  03/01/2016, 3:23 PM

## 2016-03-01 NOTE — Progress Notes (Signed)
[facility] PHYSICAL MEDICINE & REHABILITATION     PROGRESS NOTE  Subjective/Complaints:  Pt seen laying in bed this AM.  He states he slept well overnight.  He is pleased with his performance in therapies yesterday.  Per nursing, pt refusing meds last night, however, pt reports request to increased GERD meds today.  ROS: +GERD. Denies CP, SOB, N/V/D.  Objective: Vital Signs: Blood pressure 122/66, pulse 81, temperature 99.4 F (37.4 C), temperature source Oral, resp. rate 18, weight 91.5 kg (201 lb 11.2 oz), SpO2 94 %. No results found.  Recent Labs  02/28/16 0548  WBC 9.5  HGB 12.3*  HCT 37.2*  PLT 315   No results for input(s): NA, K, CL, GLUCOSE, BUN, CREATININE, CALCIUM in the last 72 hours.  Invalid input(s): CO CBG (last 3)  No results for input(s): GLUCAP in the last 72 hours.  Wt Readings from Last 3 Encounters:  03/01/16 91.5 kg (201 lb 11.2 oz)  02/28/16 96.5 kg (212 lb 11.9 oz)  02/17/16 95.3 kg (210 lb)    Physical Exam:  BP 122/66   Pulse 81   Temp 99.4 F (37.4 C) (Oral)   Resp 18   Wt 91.5 kg (201 lb 11.2 oz)   SpO2 94%   BMI 26.61 kg/m  Constitutional: He appears well-developed. No distress.  HENT: Normocephalicand atraumatic.  Eyes: EOMI. No discharge.  Cardiovascular: IRRR. No JVD. Respiratory: Effort normaland breath sounds normal.  GI: Soft. Bowel sounds are normal.  Musculoskeletal:  Back tenderness. No edema. Skin: Skin is warm. He is not diaphoretic.  Psychiatric:  Cooperative. Impulsive. Tangential Neurological: He is alert and oriented.  Motor: 5/5 bilateral deltoid, biceps, tricesp, wrist, hand intrinsics 4/5 bilateral hip flexors, knee extensors, ankle dorsiflexor  Sensation intact to light touch bilateral lower limbs to light touch.   Assessment/Plan: 1. Functional deficits secondary to discitis and ecephalopathy which require 3+ hours per day of interdisciplinary therapy in a comprehensive inpatient rehab  setting. Physiatrist is providing close team supervision and 24 hour management of active medical problems listed below. Physiatrist and rehab team continue to assess barriers to discharge/monitor patient progress toward functional and medical goals.  Function:  Bathing Bathing position   Position: Shower  Bathing parts Body parts bathed by patient: Right arm, Left arm, Chest, Abdomen, Front perineal area, Right upper leg, Left upper leg Body parts bathed by helper: Buttocks, Right lower leg, Left lower leg, Back  Bathing assist Assist Level: Touching or steadying assistance(Pt > 75%)      Upper Body Dressing/Undressing Upper body dressing   What is the patient wearing?: Pull over shirt/dress, Orthosis     Pull over shirt/dress - Perfomed by patient: Thread/unthread right sleeve, Thread/unthread left sleeve, Put head through opening, Pull shirt over trunk       Orthosis activity level: Performed by helper  Upper body assist Assist Level: Set up, Supervision or verbal cues   Set up : To obtain clothing/put away, To apply TLSO, cervical collar  Lower Body Dressing/Undressing Lower body dressing   What is the patient wearing?: Underwear, Pants, Non-skid slipper socks Underwear - Performed by patient: Thread/unthread left underwear leg, Pull underwear up/down Underwear - Performed by helper: Thread/unthread right underwear leg Pants- Performed by patient: Thread/unthread right pants leg, Thread/unthread left pants leg, Pull pants up/down, Fasten/unfasten pants     Non-skid slipper socks- Performed by helper: Don/doff right sock, Don/doff left sock  Lower body assist Assist for lower body dressing: Touching or steadying assistance (Pt > 75%)      Toileting Toileting Toileting activity did not occur: No continent bowel/bladder event Toileting steps completed by patient: Adjust clothing prior to toileting, Adjust clothing after toileting Toileting steps  completed by helper: Performs perineal hygiene    Toileting assist Assist level: Touching or steadying assistance (Pt.75%)   Transfers Chair/bed transfer   Chair/bed transfer method: Stand pivot Chair/bed transfer assist level: Moderate assist (Pt 50 - 74%/lift or lower) Chair/bed transfer assistive device: Armrests, Medical sales representative     Max distance: 129ft  Assist level: Touching or steadying assistance (Pt > 75%)   Wheelchair   Type: Manual Max wheelchair distance: 136ft  Assist Level: Touching or steadying assistance (Pt > 75%)  Cognition Comprehension Comprehension assist level: Follows basic conversation/direction with extra time/assistive device  Expression Expression assist level: Expresses basic needs/ideas: With no assist  Social Interaction Social Interaction assist level: Interacts appropriately 75 - 89% of the time - Needs redirection for appropriate language or to initiate interaction.  Problem Solving Problem solving assist level: Solves basic 75 - 89% of the time/requires cueing 10 - 24% of the time  Memory Memory assist level: Recognizes or recalls 75 - 89% of the time/requires cueing 10 - 24% of the time    Medical Problem List and Plan: 1. 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 Lumbar corset applied in sitting position  Cont CIR 2. DVT Prophylaxis/Anticoagulation: Eliquis 3. Pain Management:Lidoderm patch. Oxycodone as needed 4. Mood: Provide emotional support 5. Neuropsych: This patient is not capable of making decisions on hisown behalf. 6. Skin/Wound Care: Routine skin checks 7. Fluids/Electrolytes/Nutrition: Routine I&Os  Follow BMP 8.Infectious disease: plan is for6 weeks penicillin G per infectious disease  TEE results reviewed, no vegetation.  9.New-onset atrial fibrillation as well as history of CAD with stenting. Cardizem 90 mg every 6 hours,  Lopressor 125 mg twice a day. Cardiac rate control. Follow-up cardiology services -monitor for exercise tolerance with PT, check VS as needed 10.Hypertension. Lisinopril 20 mg daily and monitor with increased mobility  Controlled 2/11 11.Hyperlipidemia. Lipitor 12.Constipation. Laxative assistance 13. ABLA  Hb 12.2 on 2/7  Labs ordered for tomorrow  Cont to monitor 14. Hypoalbuminemia  Supplement initiated 2/10 15. GERD  Protonix changed to BID    LOS (Days) 2 A FACE TO FACE EVALUATION WAS PERFORMED  Ahlana Slaydon [name] 03/01/2016 10:13 AM

## 2016-03-01 NOTE — Plan of Care (Signed)
Problem: RH BLADDER ELIMINATION Goal: RH STG MANAGE BLADDER WITH ASSISTANCE STG Manage Bladder With min Assistance   Outcome: Not Progressing Incontinent at times, requiring total assistance.

## 2016-03-02 ENCOUNTER — Inpatient Hospital Stay (HOSPITAL_COMMUNITY): Payer: Medicare Other | Admitting: Speech Pathology

## 2016-03-02 ENCOUNTER — Inpatient Hospital Stay (HOSPITAL_COMMUNITY): Payer: Medicare Other | Admitting: Occupational Therapy

## 2016-03-02 ENCOUNTER — Encounter (HOSPITAL_COMMUNITY): Payer: Self-pay | Admitting: Internal Medicine

## 2016-03-02 ENCOUNTER — Inpatient Hospital Stay (HOSPITAL_COMMUNITY): Payer: Medicare Other | Admitting: Physical Therapy

## 2016-03-02 LAB — CBC WITH DIFFERENTIAL/PLATELET
Basophils Absolute: 0 10*3/uL (ref 0.0–0.1)
Basophils Relative: 0 %
EOS PCT: 1 %
Eosinophils Absolute: 0.1 10*3/uL (ref 0.0–0.7)
HCT: 35.4 % — ABNORMAL LOW (ref 39.0–52.0)
HEMOGLOBIN: 11.7 g/dL — AB (ref 13.0–17.0)
LYMPHS ABS: 0.8 10*3/uL (ref 0.7–4.0)
LYMPHS PCT: 9 %
MCH: 31.2 pg (ref 26.0–34.0)
MCHC: 33.1 g/dL (ref 30.0–36.0)
MCV: 94.4 fL (ref 78.0–100.0)
MONOS PCT: 7 %
Monocytes Absolute: 0.7 10*3/uL (ref 0.1–1.0)
Neutro Abs: 8 10*3/uL — ABNORMAL HIGH (ref 1.7–7.7)
Neutrophils Relative %: 83 %
PLATELETS: 422 10*3/uL — AB (ref 150–400)
RBC: 3.75 MIL/uL — AB (ref 4.22–5.81)
RDW: 14.1 % (ref 11.5–15.5)
WBC: 9.7 10*3/uL (ref 4.0–10.5)

## 2016-03-02 LAB — COMPREHENSIVE METABOLIC PANEL
ALK PHOS: 70 U/L (ref 38–126)
ALT: 96 U/L — AB (ref 17–63)
AST: 53 U/L — ABNORMAL HIGH (ref 15–41)
Albumin: 2.3 g/dL — ABNORMAL LOW (ref 3.5–5.0)
Anion gap: 7 (ref 5–15)
BUN: 12 mg/dL (ref 6–20)
CALCIUM: 8.9 mg/dL (ref 8.9–10.3)
CO2: 25 mmol/L (ref 22–32)
CREATININE: 0.9 mg/dL (ref 0.61–1.24)
Chloride: 100 mmol/L — ABNORMAL LOW (ref 101–111)
GFR calc non Af Amer: >60 mL/min (ref >60)
Glucose, Bld: 135 mg/dL — ABNORMAL HIGH (ref 65–99)
Potassium: 4.6 mmol/L (ref 3.5–5.1)
Sodium: 132 mmol/L — ABNORMAL LOW (ref 135–145)
Total Bilirubin: 0.3 mg/dL (ref 0.3–1.2)
Total Protein: 6.2 g/dL — ABNORMAL LOW (ref 6.5–8.1)

## 2016-03-02 MED ORDER — SODIUM CHLORIDE 0.9% FLUSH
10.0000 mL | Freq: Two times a day (BID) | INTRAVENOUS | Status: DC
  Administered 2016-03-04 – 2016-03-06 (×4): 10 mL

## 2016-03-02 MED ORDER — SODIUM CHLORIDE 0.9% FLUSH
10.0000 mL | INTRAVENOUS | Status: DC | PRN
  Administered 2016-03-02 – 2016-03-09 (×5): 10 mL
  Filled 2016-03-02 (×4): qty 40

## 2016-03-02 MED ORDER — CALCIUM CARBONATE ANTACID 500 MG PO CHEW
1.0000 | CHEWABLE_TABLET | Freq: Two times a day (BID) | ORAL | Status: DC
  Administered 2016-03-02 – 2016-03-09 (×13): 200 mg via ORAL
  Filled 2016-03-02 (×15): qty 1

## 2016-03-02 MED ORDER — ACYCLOVIR 5 % EX OINT
TOPICAL_OINTMENT | Freq: Four times a day (QID) | CUTANEOUS | Status: DC
  Administered 2016-03-02 – 2016-03-07 (×19): via TOPICAL
  Administered 2016-03-08: 1 via TOPICAL
  Administered 2016-03-09: 12:00:00 via TOPICAL
  Filled 2016-03-02: qty 15

## 2016-03-02 MED ORDER — PREDNISOLONE ACETATE 1 % OP SUSP
1.0000 [drp] | Freq: Every day | OPHTHALMIC | Status: DC
  Administered 2016-03-03 – 2016-03-08 (×5): 1 [drp] via OPHTHALMIC
  Filled 2016-03-02 (×2): qty 1

## 2016-03-02 NOTE — IPOC Note (Signed)
Overall Plan of Care [facility]) Patient Details Name: Jonathan Holden MRN: [id number] DOB: [birth year]  Admitting Diagnosis: Discitis  Hospital Problems: Active Problems:   Essential hypertension   Acute encephalopathy   Lumbar discitis   Acute blood loss anemia   Hypoalbuminemia due to protein-calorie malnutrition (HCC)   Benign essential HTN   New onset a-fib ([place])   Gastroesophageal reflux disease     Functional Problem List: Nursing Behavior, Nutrition, Pain, Skin Integrity  PT Balance, Behavior, Endurance, Motor, Safety, Skin Integrity  OT Balance, Cognition, Endurance, Safety, Pain  SLP Cognition  TR         Basic ADL's: OT Grooming, Bathing, Dressing, Toileting     Advanced  ADL's: OT       Transfers: PT Bed Mobility, Bed to Chair, Car, Furniture, Futures trader, Metallurgist: PT Ambulation, Stairs, Wheelchair Mobility     Additional Impairments: OT None  SLP None      TR      Anticipated Outcomes Item Anticipated Outcome  Self Feeding Mod I  Swallowing      Basic self-care  Supervision  Toileting  Supervision   Bathroom Transfers Supervision  Bowel/Bladder  pt will be continent of bowel and bladder  Transfers  Superivsion assist with LRAD   Locomotion  Supervision assist with LRAD   Communication     Cognition  Mod I  Pain  less than 3  Safety/Judgment  pt will remain fall free during admission   Therapy Plan: PT Intensity: Minimum of 1-2 x/day ,45 to 90 minutes PT Frequency: 5 out of 7 days PT Duration Estimated Length of Stay: 10-12 OT Intensity: Minimum of 1-2 x/day, 45 to 90 minutes OT Frequency: 5 out of 7 days OT Duration/Estimated Length of Stay: 10-12 days SLP Intensity: Minumum of 1-2 x/day, 30 to 90 minutes SLP Frequency: 3 to 5 out of 7 days SLP Duration/Estimated Length of Stay: 7 to 14 days       Team Interventions: Nursing Interventions Pain Management, Medication Management, Skin Care/Wound  Management  PT interventions Ambulation/gait training, Training and development officer, Cognitive remediation/compensation, Community reintegration, Discharge planning, Disease management/prevention, DME/adaptive equipment instruction, Functional mobility training, Neuromuscular re-education, Pain management, Patient/family education, Psychosocial support, Skin care/wound management, Stair training, Splinting/orthotics, Therapeutic Activities, Therapeutic Exercise, UE/LE Strength taining/ROM, UE/LE Coordination activities, Visual/perceptual remediation/compensation, Wheelchair propulsion/positioning  OT Interventions Training and development officer, Cognitive remediation/compensation, Discharge planning, Community reintegration, DME/adaptive equipment instruction, Functional mobility training, Pain management, Patient/family education, Self Care/advanced ADL retraining, Therapeutic Activities, UE/LE Strength taining/ROM, Therapeutic Exercise, UE/LE Coordination activities  SLP Interventions Cognitive remediation/compensation, Cueing hierarchy, Environmental controls, Internal/external aids, Therapeutic Activities, Patient/family education, Medication managment, Functional tasks  TR Interventions    SW/CM Interventions Discharge Planning, Psychosocial Support, Patient/Family Education    Team Discharge Planning: Destination: PT-Home ,OT- Home , SLP-Home Projected Follow-up: PT-Home health PT, OT-  Home health OT, Outpatient OT, SLP-None Projected Equipment Needs: PT-Rolling walker with 5" wheels, To be determined, OT- To be determined, SLP-None recommended by SLP Equipment Details: PT- , OT-  Patient/family involved in discharge planning: PT- Patient,  OT-Patient, SLP-Patient  MD ELOS: 10-12 days. Medical Rehab Prognosis:  Good Assessment: 71 y.o.right handed malewith history of hypertension, CAD with stenting, previous laminectomy at L4-5 and L5-S1. Presented 02/21/2016 with low back pain and altered  mental status. He was initially seen in the ED 02/17/2016 given a prescription of steroids and hydrocodone and sent home. A recent MRI as an outpatient showed herniated L3-4 and previous  surgical changes. Patient gradually became more confused and returned back to the ED. Found to have A. fib with AVR. Alcohol and urine drug screens negative. Fever 102. WBC of 11,000. Lactic acid unremarkable. Troponin 0.06. Placed on broad-spectrum antibiotics for suspect sepsis. Neurosurgery has been consulted with workup ongoing with MRI reviewed that did not mention disc space infection however discitis was suspect L4-5, L5-S1.Lumbar corset applied in sitting position when up with therapies. Infectious disease consulted pathogen consistent with streptococcal bacteremia and placed on high-dose penicillin 6 weeks.Cardiology consulted to address atrial fibrillation and currently on intravenous heparin and transitioned to Eliquis. Echocardiogram with ejection fraction 60% no wall motion abnormalities. TEE negative for vegetation. Still with ongoing bouts of confusion.  Pt with resulting functional deficits mobilitiy, impulsivity, cognition, endurance, balance.  Will set goals for supervision for most tasks with PT/OT and Mod I with SLP.     See Team Conference Notes for weekly updates to the plan of care

## 2016-03-02 NOTE — Progress Notes (Signed)
Physical Therapy Note  Patient Details  Name: JUAN DORSAINVIL MRN: [id number] Date of Birth: [birth year] Today's Date: 03/02/2016    Time: 1300-1341 41 minutes  1:1 Pt c/o pain 5/6 in low back, meds given prior to session.  Pt able to perform supine to sit with supervision, able to don brace with min A and cuing for technique.  Sit to stand multiple reps for strengthening with supervision and cues for UE placement each rep.  Toilet transfers and gait in room with RW with close supervision. Pt with decreased safety awareness and requires cues for negotiating tight spaces and around IV pole safely.  Seated and standing therex for LE Strengthening with 3# wt bilat with pt requiring verbal and visual cues to follow exercise program.  Carlon Davidson 03/02/2016, 1:42 PM

## 2016-03-02 NOTE — Discharge Instructions (Addendum)
Inpatient Rehab Discharge Instructions  Jonathan Holden Discharge date and time: 03/09/16   Activities/Precautions/ Functional Status: Activity: Lumbar corset when out of bed Diet: regular diet Wound Care: none needed   Functional status:  ___ No restrictions     ___ Walk up steps independently _X__ 24/7 supervision/assistance   ___ Walk up steps with assistance ___ Intermittent supervision/assistance  ___ Bathe/dress independently ___ Walk with walker     _x__ Bathe/dress with assistance ___ Walk Independently    ___ Shower independently ___ Walk with assistance    ___ Shower with assistance _X__ No alcohol     ___ Return to work/school ________  Special Instructions: 1. IV rocephin to continue till 03/18/201.  2.  NO DRIVING    COMMUNITY REFERRALS UPON DISCHARGE:    Home Health:   PT, OT, RN  [facility]   Date of last service:03/09/2016  Medical Equipment/Items Ordered:WHEELCHAIR, Vassie Moselle & 3 IN 1  Agency/Supplier:[facility]   [phone]  Other:PRIVATE DUTY LIST-[place]LL[place] TO PURSUE THIS    My questions have been answered and I understand these instructions. I will adhere to these goals and the provided educational materials after my discharge from the hospital.  Patient/Caregiver Signature _______________________________ Date __________  Clinician Signature _______________________________________ Date __________  Please bring this form and your medication list with you to all your follow-up doctor's appointments. Information on my medicine - ELIQUIS (apixaban)  This medication education was reviewed with me or my healthcare representative as part of my discharge preparation.    Why was Eliquis prescribed for you? Eliquis was prescribed for you to reduce the risk of a blood clot forming that can cause a stroke if you have a medical condition called atrial fibrillation (a type of irregular heartbeat).  What do You  need to know about Eliquis ? Take your Eliquis 5mg  TWICE DAILY - one tablet in the morning and one tablet in the evening with or without food. If you have difficulty swallowing the tablet whole please discuss with your pharmacist how to take the medication safely.  Take Eliquis exactly as prescribed by your doctor and DO NOT stop taking Eliquis without talking to the doctor who prescribed the medication.  Stopping may increase your risk of developing a stroke.  Refill your prescription before you run out.  After discharge, you should have regular check-up appointments with your healthcare provider that is prescribing your Eliquis.  In the future your dose may need to be changed if your kidney function or weight changes by a significant amount or as you get older.  What do you do if you miss a dose? If you miss a dose, take it as soon as you remember on the same day and resume taking twice daily.  Do not take more than one dose of ELIQUIS at the same time to make up a missed dose.  Important Safety Information A possible side effect of Eliquis is bleeding. You should call your healthcare provider right away if you experience any of the following: ? Bleeding from an injury or your nose that does not stop. ? Unusual colored urine (red or dark brown) or unusual colored stools (red or black). ? Unusual bruising for unknown reasons. ? A serious fall or if you hit your head (even if there is no bleeding).  Some medicines may interact with Eliquis and might increase your risk of bleeding or clotting while on Eliquis. To help avoid this, consult your healthcare provider or pharmacist prior  to using any new prescription or non-prescription medications, including herbals, vitamins, non-steroidal anti-inflammatory drugs (NSAIDs) and supplements.  This website has more information on Eliquis (apixaban): http://www.eliquis.com/eliquis/home

## 2016-03-02 NOTE — Care Management Note (Signed)
Inpatient [facility] Individual Statement of Services  Patient Name:  Jonathan Holden  Date:  03/02/2016  Welcome to the [facility].  Our goal is to provide you with an individualized program based on your diagnosis and situation, designed to meet your specific needs.  With this comprehensive rehabilitation program, you will be expected to participate in at least 3 hours of rehabilitation therapies Monday-Friday, with modified therapy programming on the weekends.  Your rehabilitation program will include the following services:  Physical Therapy (PT), Occupational Therapy (OT), Speech Therapy (ST), 24 hour per day rehabilitation nursing, Neuropsychology, Case Management (Social Worker), Rehabilitation Medicine, Nutrition Services and Pharmacy Services  Weekly team conferences will be held on Wednesday to discuss your progress.  Your Social Worker will talk with you frequently to get your input and to update you on team discussions.  Team conferences with you and your family in attendance may also be held.  Expected length of stay: 10-12 days Overall anticipated outcome: supervision-cueing  Depending on your progress and recovery, your program may change. Your Social Worker will coordinate services and will keep you informed of any changes. Your Social Worker's name and contact numbers are listed  below.  The following services may also be recommended but are not provided by the [facility] will be made to provide these services after discharge if needed.  Arrangements include referral to agencies that provide these services.  Your insurance has been verified to be: Medicare & [place] Your primary doctor is:  Daneen Schick  Pertinent information will be shared with your doctor and your insurance  company.  Social Worker:  Ovidio Kin, [place] or ([phone]  Information discussed with and copy given to patient by: Elease Hashimoto, 03/02/2016, 10:34 AM

## 2016-03-02 NOTE — Progress Notes (Signed)
Speech Language Pathology Daily Session Note  Patient Details  Name: Jonathan Holden MRN: [id number] Date of Birth: 1945-09-13  Today's Date: 03/02/2016 SLP Individual Time: 1400-1500 SLP Individual Time Calculation (min): 60 min  Short Term Goals: Week 1: SLP Short Term Goal 1 (Week 1): Pt will itilize external aides for recall of basic, daily as well as new informaiton with Min A verbal cues. SLP Short Term Goal 2 (Week 1): Pt will demonstrate selective attention to task in moderately distracting environment for ~30 Minutes iwth Min A verbal cues for redirections.  SLP Short Term Goal 3 (Week 1): Pt will complete complex medication management tasks with Min A verbal cues.  SLP Short Term Goal 4 (Week 1): Pt will complete complex money management tasks with Min A verbal cues.  SLP Short Term Goal 5 (Week 1): Pt will demonstrate anticipatory awareness by stating 3 activites that pt can safety participate in within home setting.   Skilled Therapeutic Interventions: Skilled ST treatment session focused on cognition goals. SLP facilitated session by providing Mod A for recall of auditory information. Pt required Min A verbal cues for redirection to task in moderately distracting environment. Pt completed novel semi-complex tasks with Min A verbal cues. Pt was returned to room, helped into bed and all needs left within reach. Continue current plan of care.   Function:   Cognition Comprehension Comprehension assist level: Follows basic conversation/direction with extra time/assistive device  Expression   Expression assist level: Expresses basic needs/ideas: With no assist  Social Interaction Social Interaction assist level: Interacts appropriately 75 - 89% of the time - Needs redirection for appropriate language or to initiate interaction.  Problem Solving Problem solving assist level: Solves basic 75 - 89% of the time/requires cueing 10 - 24% of the time  Memory Memory assist level: Recognizes  or recalls 75 - 89% of the time/requires cueing 10 - 24% of the time    Pain    Therapy/Group: Individual Therapy  Isha Seefeld B. Rutherford Nail, M.S., [place] 03/02/2016, 3:17 PM

## 2016-03-02 NOTE — Progress Notes (Signed)
Social Work Assessment and Plan Social Work Assessment and Plan  Patient Details  Name: Jonathan Holden MRN: [id number] Date of Birth: December 23, 1945  Today's Date: 03/02/2016  Problem List:  Patient Active Problem List   Diagnosis Date Noted  . Gastroesophageal reflux disease   . Acute blood loss anemia   . Hypoalbuminemia due to protein-calorie malnutrition ([place])   . Benign essential HTN   . New onset a-fib ([place])   . Bacteremia   . Pulmonary hypertension   . Low back pain   . Ileus ([place])   . Endocarditis of native valve   . Infection of intervertebral disc ([place])   . Streptococcal bacteremia   . Lumbar discitis   . Sepsis ([place]) 02/21/2016  . Hyponatremia 02/21/2016  . Atrial fibrillation with RVR ([place]) 02/21/2016  . Back pain 02/21/2016  . Acute encephalopathy 02/21/2016  . CAD (coronary artery disease), native coronary artery 09/19/2014  . Essential hypertension 09/19/2014  . Hyperlipidemia 10/03/2013   Past Medical History:  Past Medical History:  Diagnosis Date  . Arthritis HANDS AND RIGHT SHOULDER  . Coronary artery disease CARDIOLOGIST- DR Daneen Schick- LAST VISIT 6 MON AGO--  REQUESTED NOTE, EKG, ECHO, STRESS TEST   PT DENIES S & S  . H/O gastric ulcer   . History of MI (myocardial infarction) 1992  . Hyperlipidemia   . Hypertension   . Loose body of right shoulder   . S/P coronary angioplasty    Past Surgical History:  Past Surgical History:  Procedure Laterality Date  . CARDIOVASCULAR STRESS TEST  08-22-2008---  DR Daneen Schick   NO ISCHEMIA.   Marland Kitchen CORONARY ANGIOPLASTY  1992  . ERCP W/ SPHICTEROTOMY  12-15-2002   AND STONE EXTRACTION  . LAPAROSCOPIC CHOLECYSTECTOMY  12-16-2002  . LUMBAR LAMINECTOMY  20 YRS AGO   L5  . TEE WITHOUT CARDIOVERSION N/A 02/28/2016   Procedure: TRANSESOPHAGEAL ECHOCARDIOGRAM (TEE);  Surgeon: Fay Records, MD;  Location: [facility] ENDOSCOPY;  Service: Cardiovascular;  Laterality: N/A;   Social History:  reports that he quit smoking about  25 years ago. His smoking use included Cigarettes. He has a 40.00 pack-year smoking history. He has never used smokeless tobacco. He reports that he drinks about 4.2 oz of alcohol per week . He reports that he does not use drugs.  Family / Support Systems Marital Status: Divorced Patient Roles: Partner, Other (Comment) (Self employed) Spouse/Significant Other: Bettye Boeck [phone] Other Supports: Dan-brother [phone] Anticipated Caregiver: Mollie Ability/Limitations of Caregiver: [place] Faster can work from home and brother works also Careers adviser: 24/7 Family Dynamics: Close with Denver Faster and Linna Hoff, he works with his brother in their business. Both Mollie and Linna Hoff will make sure pt has what he needs when goes home. Pt has always been extremely independent and wants to be that way again. This is all new to him.  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 Mollie is here daily and will be involved.   Abuse/Neglect Physical Abuse: Denies Verbal Abuse: Denies Sexual Abuse: Denies Exploitation of patient/patient's resources: Denies Self-Neglect: Denies  Emotional Status Pt's affect, behavior adn adjustment status: Pt is motivated to get stronger while here, he is  still trying to fully understand what has happened to him. He has asked each MD who comes into his room to try to better understand his condition. He is feeling better and more with it now. He is not one to depend upon others, so it is difficult for him. Recent Psychosocial Issues: healthy prior  to this happening to him Pyschiatric History: No history deferred depression screen due to still so new and still trying to understand what happen to him.  He at times becomes tearful talking about what has happened to him. He would benefit from seeing neuro-psych while here. Will make referral. Substance Abuse History: No issues  Patient / Family Perceptions, Expectations & Goals Pt/Family understanding of illness & functional limitations: Pt has spoken with the MD and is still trying to find out what has happened to him and the infection he got which is being treated now. He and Mollie do talk with his treating physicans and pt will continue to seek out information regarding his medical condition. 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, Financial controller, friend, home owner, etc Anticipated changes in roles/activities/participation: resume Pt/family expectations/goals: Pt states: " I want to do for myself and not have to rely upon Mollie, she has things to do."  Mollie states: " We will make sure he has whatever he needs."  [employer]: None Premorbid Home Care/DME Agencies: None Transportation available at discharge: [employer] referrals recommended: Neuropsychology  Discharge Planning Living Arrangements: Spouse/significant other Support Systems: Spouse/significant other, Other relatives, Friends/neighbors Type of Residence: Private residence Insurance underwriter Resources: [employer], Multimedia programmer (specify) Web designer) Financial Resources: Employment Financial Screen Referred: No Living Expenses: Own Money Management: Patient, Significant Other Does the patient have any problems obtaining your medications?: No Home Management: Mollie does the home management Patient/Family Preliminary Plans: return home with [facility] who plans to provide assistance to pt, she can work from home. Will see how pt progresses and work on a realistic discharge plan, he will need 6 weeks of IV  antibiotics total. Mollie is in and out so will have observe in therapies when hee. Social Work Anticipated Follow Up Needs: HH/OP  Clinical Impression Pleasant at times tearful patient still trying to figure out what happened to him to be here. Where did he get the infection from?  Very supportive girlfriend and brother who are willing to assist him at discharge. Will work on discharge needs, should do well here, main issue is weakness from infection and back pain. Would benefit from seeing neuro-psych while here, will make referral.  Graceyn Fodor, [place] Rhyme 03/02/2016, 4:00 PM

## 2016-03-02 NOTE — Progress Notes (Signed)
[place] PHYSICAL MEDICINE & REHABILITATION     PROGRESS NOTE  Subjective/Complaints:  Pt states pain is controlled. Had several questions about his "condition". Incontinent per RN. States he has cold sores on mouth---would like a cream  ROS: pt denies nausea, vomiting, diarrhea, cough, shortness of breath or chest pain   Objective: Vital Signs: Blood pressure 133/70, pulse 75, temperature 98.1 F (36.7 C), temperature source Oral, resp. rate 18, weight 93.9 kg (207 lb), SpO2 95 %. No results found. No results for input(s): WBC, HGB, HCT, PLT in the last 72 hours. No results for input(s): NA, K, CL, GLUCOSE, BUN, CREATININE, CALCIUM in the last 72 hours.  Invalid input(s): CO CBG (last 3)  No results for input(s): GLUCAP in the last 72 hours.  Wt Readings from Last 3 Encounters:  03/02/16 93.9 kg (207 lb)  02/28/16 96.5 kg (212 lb 11.9 oz)  02/17/16 95.3 kg (210 lb)    Physical Exam:  BP 133/70   Pulse 75   Temp 98.1 F (36.7 C) (Oral)   Resp 18   Wt 93.9 kg (207 lb)   SpO2 95%   BMI 27.31 kg/m  Constitutional: He appears well-developed. No distress.  HENT: Normocephalicand atraumatic. Cold sores around upper lip Eyes: EOMI. No discharge.  Cardiovascular: IRR without JVD. Respiratory: Effort normaland breath sounds normal.  GI: Soft. Bowel sounds are normal.  Musculoskeletal:  Back tenderness. No edema. Skin: Skin is warm. He is not diaphoretic.  Psychiatric:  Cooperative. Impulsive. Tangential but more redirectable Neurological: He is alert and oriented.  Motor: 5/5 bilateral deltoid, biceps, tricesp, wrist, hand intrinsics 4/5 bilateral hip flexors, knee extensors, ankle dorsiflexor  Sensation intact to light touch bilateral lower limbs to light touch. No motor/sensory changes  Assessment/Plan: 1. Functional deficits secondary to discitis and ecephalopathy which require 3+ hours per day of interdisciplinary therapy in a comprehensive inpatient rehab  setting. Physiatrist is providing close team supervision and 24 hour management of active medical problems listed below. Physiatrist and rehab team continue to assess barriers to discharge/monitor patient progress toward functional and medical goals.  Function:  Bathing Bathing position   Position: Shower  Bathing parts Body parts bathed by patient: Right arm, Left arm, Chest, Abdomen, Front perineal area, Right upper leg, Left upper leg Body parts bathed by helper: Buttocks, Right lower leg, Left lower leg, Back  Bathing assist Assist Level: Touching or steadying assistance(Pt > 75%)      Upper Body Dressing/Undressing Upper body dressing   What is the patient wearing?: Pull over shirt/dress, Orthosis     Pull over shirt/dress - Perfomed by patient: Thread/unthread right sleeve, Thread/unthread left sleeve, Put head through opening, Pull shirt over trunk       Orthosis activity level: Performed by helper  Upper body assist Assist Level: Set up, Supervision or verbal cues   Set up : To obtain clothing/put away, To apply TLSO, cervical collar  Lower Body Dressing/Undressing Lower body dressing   What is the patient wearing?: Underwear, Pants, Non-skid slipper socks Underwear - Performed by patient: Thread/unthread left underwear leg, Pull underwear up/down Underwear - Performed by helper: Thread/unthread right underwear leg Pants- Performed by patient: Thread/unthread right pants leg, Thread/unthread left pants leg, Pull pants up/down, Fasten/unfasten pants     Non-skid slipper socks- Performed by helper: Don/doff right sock, Don/doff left sock                  Lower body assist Assist for lower body dressing: Touching  or steadying assistance (Pt > 75%)      Toileting Toileting Toileting activity did not occur: No continent bowel/bladder event Toileting steps completed by patient: Adjust clothing prior to toileting, Adjust clothing after toileting Toileting steps  completed by helper: Performs perineal hygiene    Toileting assist Assist level: Touching or steadying assistance (Pt.75%)   Transfers Chair/bed transfer   Chair/bed transfer method: Stand pivot Chair/bed transfer assist level: Touching or steadying assistance (Pt > 75%) Chair/bed transfer assistive device: Walker, Air cabin crew     Max distance: 120' Assist level: Touching or steadying assistance (Pt > 75%)   Wheelchair   Type: Manual Max wheelchair distance: 169ft  Assist Level: Touching or steadying assistance (Pt > 75%)  Cognition Comprehension Comprehension assist level: Follows basic conversation/direction with extra time/assistive device  Expression Expression assist level: Expresses basic needs/ideas: With no assist  Social Interaction Social Interaction assist level: Interacts appropriately 90% of the time - Needs monitoring or encouragement for participation or interaction.  Problem Solving Problem solving assist level: Solves basic 50 - 74% of the time/requires cueing 25 - 49% of the time  Memory Memory assist level: Recognizes or recalls 50 - 74% of the time/requires cueing 25 - 49% of the time    Medical Problem List and Plan: 1. 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 Lumbar corset applied in sitting position  Cont CIR therapies 2. DVT Prophylaxis/Anticoagulation: Eliquis 3. Pain Management:Lidoderm patch. Oxycodone as needed 4. Mood: Provide emotional support 5. Neuropsych: This patient is somewhat capable of making decisions on hisown behalf. 6. Skin/Wound Care: Routine skin checks 7. Fluids/Electrolytes/Nutrition: Routine I&Os  Follow BMP 8.Infectious disease: plan is for6 weeks penicillin G per infectious disease  TEE without vegetation.   -zovirax cream for cold sores 9.New-onset atrial fibrillation as well as history of CAD with stenting. Cardizem 90 mg  every 6 hours, Lopressor 125 mg twice a day. Cardiac rate control. Follow-up cardiology services -monitor for exercise tolerance with PT, check VS as needed 10.Hypertension. Lisinopril 20 mg daily and monitor with increased mobility  Controlled 2/11 11.Hyperlipidemia. Lipitor 12.Constipation. Laxative assistance 13. ABLA  Hb 12.2 on 2/7  Labs pending for today  Cont to monitor 14. Hypoalbuminemia  Supplement initiated 2/10 15. GERD  Protonix changed to BID    LOS (Days) 3 A FACE TO FACE EVALUATION WAS PERFORMED  Asia Favata T 03/02/2016 9:29 AM

## 2016-03-02 NOTE — Progress Notes (Signed)
Attempted timed toileting, but continued to have problems with incontinence. Patient inquired R/T using condom cath at [facility]. Condom cath applied. PRN tylenol  given at 2349, for back pain. Lidoderm patches applied to back. Refused scheduled pepcid. Decreased attention and focus and rambling speech at times. Patrici Ranks A

## 2016-03-02 NOTE — Progress Notes (Signed)
Occupational Therapy Session Note  Patient Details  Name: VENICE RUYLE MRN: [id number] Date of Birth: 1945-01-25  Today's Date: 03/02/2016 OT Individual Time: 0800-0903 OT Individual Time Calculation (min): 63 min    Short Term Goals: Week 1:  OT Short Term Goal 1 (Week 1): Pt will ambulate and gather all needed clothing items in prep for bathing/dressing task with supervision OT Short Term Goal 2 (Week 1): Pt will don LSO mod I for increased independence with ADLs OT Short Term Goal 3 (Week 1): Pt will complete 2 grooming tasks in standing with distant supervision OT Short Term Goal 4 (Week 1): Pt will attend to functional task for 3 minutes with no more than 1 VC for redirection  Skilled Therapeutic Interventions/Progress Updates:    Pt completed bathing and dressing during session.  Min assist for use of the RW to ambulate from wheelchair to the tub bench.  He was able to cross LEs to doff socks prior to shower.  He washed all UB in sitting and then he stood to perform washing peri area.  He did not attempt washing LEs this visit but may benefit from [facility] sponge.  Transferred out to the wheelchair for dressing tasks.  Utilized reacher for threading pants and he donned pullover shirt.  He transferred to the EOB for donning socks by crossing each LE over the opposite knee.  Pt transitioned to supine to rest his back to conclude session secondary to increased pain.  Discussed scheduling and follow-up therapies with pt's significant other at end of session.    Therapy Documentation Precautions:  Precautions Precautions: Fall Required Braces or Orthoses: Spinal Brace (brace for comfort only) Spinal Brace: Lumbar corset, Applied in sitting position Restrictions Weight Bearing Restrictions: No  Pain: Pain Assessment Pain Assessment: 0-10 Pain Score: 3  Pain Type: Acute pain Pain Location: Back Pain Orientation: Mid;Lower Pain Descriptors / Indicators: Aching;Cramping Pain Frequency:  Intermittent Pain Onset: On-going Patients Stated Pain Goal: 4 Pain Intervention(s): Medication (See eMAR) ADL: See Function Navigator for Current Functional Status.   Therapy/Group: Individual Therapy  Pravin Perezperez OTR/L 03/02/2016, 12:15 PM

## 2016-03-02 NOTE — Progress Notes (Signed)
Patient information reviewed and entered into eRehab system by [place], RN, CRRN, PPS Coordinator.  Information including medical coding and functional independence measure will be reviewed and updated through discharge.     Per nursing patient was given "Data Collection Information Summary for Patients in Inpatient Rehabilitation Facilities with attached "Privacy Act Statement-Health Care Records" upon admission.  Present in education notebook.  

## 2016-03-02 NOTE — Progress Notes (Signed)
Occupational Therapy Session Note  Patient Details  Name: Jonathan Holden MRN: [id number] Date of Birth: [birth year]  Today's Date: 03/02/2016 OT Individual Time: [id number] OT Individual Time Calculation (min): 31 min    Short Term Goals: Week 1:  OT Short Term Goal 1 (Week 1): Pt will ambulate and gather all needed clothing items in prep for bathing/dressing task with supervision OT Short Term Goal 2 (Week 1): Pt will don LSO mod I for increased independence with ADLs OT Short Term Goal 3 (Week 1): Pt will complete 2 grooming tasks in standing with distant supervision OT Short Term Goal 4 (Week 1): Pt will attend to functional task for 3 minutes with no more than 1 VC for redirection  Skilled Therapeutic Interventions/Progress Updates:    Pt completed transfer from supine to sit EOB with supervision on hospital bed.  He donned slip on shoes with supervision as well before scooting around to the wheelchair with min guard assist.  Took pt to the ADL apartment for work on bed transfers to regular higher bed similar to what he has at home.  He was able to complete with supervision using appropriate back protection techniques.  Discussed having pillow under his knees at home as well to help decrease back pain.  Transferred back to the wheelchair with short distance mobility with close supervision without assistive device.  Pt able to complete with min guard assist, taking slow steps.  Finished session with return to the room door and having pt ambulate back to the bed without assistive device.  Min guard assist for this with supervision for transfer back to the bed.  Pt left with call button and phone in reach and bed alarm in place.    Therapy Documentation Precautions:  Precautions Precautions: Fall Required Braces or Orthoses: Spinal Brace (brace for comfort only) Spinal Brace: Lumbar corset, Applied in sitting position Restrictions Weight Bearing Restrictions: No  Pain: Pain  Assessment Pain Assessment: Faces Faces Pain Scale: Hurts a little bit Pain Type: Acute pain Pain Location: Back Pain Orientation: Lower Pain Onset: On-going Pain Intervention(s): Repositioned Multiple Pain Sites: No See Function Navigator for Current Functional Status.   Therapy/Group: Individual Therapy  Francois Elk OTR/L 03/02/2016, 3:46 PM

## 2016-03-03 ENCOUNTER — Inpatient Hospital Stay (HOSPITAL_COMMUNITY): Payer: Medicare Other | Admitting: Occupational Therapy

## 2016-03-03 ENCOUNTER — Inpatient Hospital Stay (HOSPITAL_COMMUNITY): Payer: Medicare Other | Admitting: Physical Therapy

## 2016-03-03 ENCOUNTER — Inpatient Hospital Stay (HOSPITAL_COMMUNITY): Payer: Medicare Other | Admitting: Speech Pathology

## 2016-03-03 NOTE — Progress Notes (Signed)
Subjective:    Patient reports pain as 3 on 0-10 scale. Doing much better today. Case discussed with patient and his brother. He is neurologically intavt and afebrile with a normal WBC. He needs intensive PT and may need a repeat MRI ve CT scan of his Lumbar spine. Bedside weight should be very helpful as well.  Objective: Vital signs in last 24 hours: Temp:  [97.8 F (36.6 C)] 97.8 F (36.6 C) (02/13 0426) Pulse Rate:  [68-75] 68 (02/13 0426) Resp:  [18] 18 (02/13 0426) BP: (106-131)/(58-74) 106/70 (02/13 1305) SpO2:  [95 %] 95 % (02/13 0426) Weight:  [91.9 kg (202 lb 11.2 oz)] 91.9 kg (202 lb 11.2 oz) (02/13 0500)  Intake/Output from previous day: 02/12 0701 - 02/13 0700 In: 610 [P.O.:600; I.V.:10] Out: 1400 [Urine:1400] Intake/Output this shift: Total I/O In: 240 [P.O.:240] Out: -    Recent Labs  03/02/16 1128  HGB 11.7*    Recent Labs  03/02/16 1128  WBC 9.7  RBC 3.75*  HCT 35.4*  PLT 422*    Recent Labs  03/02/16 1128  NA 132*  K 4.6  CL 100*  CO2 25  BUN 12  CREATININE 0.90  GLUCOSE 135*  CALCIUM 8.9   No results for input(s): LABPT, INR in the last 72 hours.  Neurologically intact Dorsiflexion/Plantar flexion intact Compartment soft  Assessment/Plan:    Up with therapy  Dorian Renfro A 03/03/2016, 3:04 PM

## 2016-03-03 NOTE — Progress Notes (Signed)
Occupational Therapy Session Note  Patient Details  Name: Jonathan Holden MRN: [id number] Date of Birth: 1945-10-02  Today's Date: 03/03/2016 OT Individual Time: [id number] OT Individual Time Calculation (min): 43 min    Skilled Therapeutic Interventions/Progress Updates:    Pt completed supine to sit with supervision but needed min assist for sit to stand from the EOB without use of the UEs.  He was able to stand at the sink for shaving for intervals of 7 mins and 4 mins, maintaining upright trunk posture with min assist.  Noted occasional bilateral knee flexion in standing requiring mod demonstrational cueing to correct.  [name] present for session as well and offered encouragement and support.  Discussed expectations of goals and follow-up therapy with both.  Educated pt's brother and pt on the importance of remembering things from session to session as well as increasing sitting endurance.  Pt agreed to sit up in the wheelchair at end of session, attempting to make it for at least one hour if possible.  Pt left with safety belt in place and call button and phone in reach.    Therapy Documentation Precautions:  Precautions Precautions: Fall Required Braces or Orthoses: Spinal Brace Spinal Brace: Lumbar corset, Applied in sitting position Restrictions Weight Bearing Restrictions: No  Pain: Pain Assessment Pain Assessment: 0-10 Pain Score: 5  Pain Type: Acute pain Pain Location: Back Pain Orientation: Lower Pain Descriptors / Indicators: Discomfort Pain Intervention(s): Repositioned;Emotional support ADL: See Function Navigator for Current Functional Status.   Therapy/Group: Individual Therapy  [name] OTR/L 03/03/2016, 4:13 PM

## 2016-03-03 NOTE — Progress Notes (Signed)
Speech Language Pathology Daily Session Note  Patient Details  Name: Jonathan Holden MRN: [id number] Date of Birth: [birth year]  Today's Date: 03/03/2016 SLP Individual Time: [id number] SLP Individual Time Calculation (min): 56 min  Short Term Goals: Week 1: SLP Short Term Goal 1 (Week 1): Pt will itilize external aides for recall of basic, daily as well as new informaiton with Min A verbal cues. SLP Short Term Goal 2 (Week 1): Pt will demonstrate selective attention to task in moderately distracting environment for ~30 Minutes iwth Min A verbal cues for redirections.  SLP Short Term Goal 3 (Week 1): Pt will complete complex medication management tasks with Min A verbal cues.  SLP Short Term Goal 4 (Week 1): Pt will complete complex money management tasks with Min A verbal cues.  SLP Short Term Goal 5 (Week 1): Pt will demonstrate anticipatory awareness by stating 3 activites that pt can safety participate in within home setting.   Skilled Therapeutic Interventions: Skilled treatment session focused on addressing cognition goals. Patient completed a medication management task with Max assist verbal cues to recall functions and frequencies of 4 given medications.  SLP also facilitated session by providing Max assist instruction cues for loading a 4 time a day pill box.  Patient's decreased mental flexibility impacted his ability to accept and use this pill box as patient verbally perseverated on he only took two meds in the morning prior to admission.  Continue with current plan of care.    Function:  Cognition Comprehension Comprehension assist level: Understands basic 90% of the time/cues < 10% of the time  Expression   Expression assist level: Expresses complex 90% of the time/cues < 10% of the time  Social Interaction Social Interaction assist level: Interacts appropriately 75 - 89% of the time - Needs redirection for appropriate language or to initiate interaction.  Problem Solving  Problem solving assist level: Solves basic 75 - 89% of the time/requires cueing 10 - 24% of the time  Memory Memory assist level: Recognizes or recalls 75 - 89% of the time/requires cueing 10 - 24% of the time    Pain Pain Assessment Pain Assessment: No/denies pain  Therapy/Group: Individual Therapy  Carmelia Roller., [place] [id number]  [place] 03/03/2016, 12:34 PM

## 2016-03-03 NOTE — Progress Notes (Signed)
[facility] PH[facility]ICAL [facility]ICINE & REHABILITATION     PROGRESS NOTE  Subjective/Complaints:  In bed. States he had a good day. Says he wants to talk about his problem and get a "summary" of what's going on. Recalls that we talked yesterday but states we didn't review the basic concepts  ROS: pt denies nausea, vomiting, diarrhea, cough, shortness of breath or chest pain    Objective: Vital Signs: Blood pressure 110/66, pulse 68, temperature 97.8 F (36.6 C), temperature source Oral, resp. rate 18, weight 91.9 kg (202 lb 11.2 oz), SpO2 95 %. No results found.  Recent Labs  03/02/16 1128  WBC 9.7  HGB 11.7*  HCT 35.4*  PLT 422*    Recent Labs  03/02/16 1128  NA 132*  K 4.6  CL 100*  GLUCOSE 135*  BUN 12  CREATININE 0.90  CALCIUM 8.9   CBG (last 3)  No results for input(s): GLUCAP in the last 72 hours.  Wt Readings from Last 3 Encounters:  03/03/16 91.9 kg (202 lb 11.2 oz)  02/28/16 96.5 kg (212 lb 11.9 oz)  02/17/16 95.3 kg (210 lb)    Physical Exam:  BP 110/66 (BP Location: Left Arm)   Pulse 68   Temp 97.8 F (36.6 C) (Oral)   Resp 18   Wt 91.9 kg (202 lb 11.2 oz)   SpO2 95%   BMI 26.74 kg/m  Constitutional: He appears well-developed. No distress.  HENT: Normocephalicand atraumatic. Cold sores around upper lip Eyes: EOMI. No discharge.  Cardiovascular: IRR Respiratory: Effort normaland breath sounds normal.  GI: Soft. Bowel sounds are normal.  Musculoskeletal:  Back tenderness. No edema. Skin: Skin is warm. He is not diaphoretic.  Psychiatric:  Cooperative. STM deficits Neurological: He is alert and oriented.  Motor: 5/5 bilateral deltoid, biceps, tricesp, wrist, hand intrinsics 4/5 bilateral hip flexors, knee extensors, ankle dorsiflexor  Sensation intact to light touch bilateral lower limbs to light touch. Motor/sensory exam stable.   Assessment/Plan: 1. 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 Physiatrist is providing close team supervision and 24 hour management of active medical problems listed below. Physiatrist and rehab team continue to assess barriers to discharge/monitor patient progress toward functional and medical goals.  Function:  Bathing Bathing position   Position: Shower  Bathing parts Body parts bathed by patient: Right arm, Left arm, Chest, Abdomen, Front perineal area, Right upper leg, Left upper leg, Buttocks Body parts bathed by helper: Right lower leg, Left lower leg  Bathing assist Assist Level: Touching or steadying assistance(Pt > 75%)      Upper Body Dressing/Undressing Upper body dressing   What is the patient wearing?: Pull over shirt/dress, Orthosis     Pull over shirt/dress - Perfomed by patient: Thread/unthread right sleeve, Thread/unthread left sleeve, Put head through opening, Pull shirt over trunk       Orthosis activity level: Performed by patient  Upper body assist Assist Level: Set up, Supervision or verbal cues   Set up : To obtain clothing/put away, To apply TLSO, cervical collar  Lower Body Dressing/Undressing Lower body dressing   What is the patient wearing?: Pants, Socks Underwear - Performed by patient: Thread/unthread left underwear leg, Pull underwear up/down Underwear - Performed by helper: Thread/unthread right underwear leg Pants- Performed by patient: Thread/unthread right pants leg, Thread/unthread left pants leg, Pull pants up/down, Fasten/unfasten pants (used reacher)     Non-skid slipper socks- Performed  by helper: Don/doff right sock, Don/doff left sock Socks - Performed by patient: Don/doff right sock, Don/doff left sock                Lower body assist Assist for lower body dressing: Touching or steadying assistance (Pt > 75%)      Toileting Toileting Toileting activity did not occur: No continent bowel/bladder event Toileting steps  completed by patient: Performs perineal hygiene (per [place] Ingram, NT report) Toileting steps completed by helper: Adjust clothing prior to toileting, Adjust clothing after toileting (per [place] Ingram, NT report)    Toileting assist Assist level: Touching or steadying assistance (Pt.75%)   Transfers Chair/bed transfer   Chair/bed transfer method: Stand pivot Chair/bed transfer assist level: Touching or steadying assistance (Pt > 75%) Chair/bed transfer assistive device: Walker, Air cabin crew     Max distance: 120' Assist level: Touching or steadying assistance (Pt > 75%)   Wheelchair   Type: Manual Max wheelchair distance: 167ft  Assist Level: Touching or steadying assistance (Pt > 75%)  Cognition Comprehension Comprehension assist level: Follows basic conversation/direction with extra time/assistive device  Expression Expression assist level: Expresses basic needs/ideas: With no assist  Social Interaction Social Interaction assist level: Interacts appropriately 75 - 89% of the time - Needs redirection for appropriate language or to initiate interaction.  Problem Solving Problem solving assist level: Solves basic 75 - 89% of the time/requires cueing 10 - 24% of the time  Memory Memory assist level: Recognizes or recalls 75 - 89% of the time/requires cueing 10 - 24% of the time    Medical Problem List and Plan: 1. 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 Lumbar corset applied in sitting position  Cont CIR therapies. Team conference today  -reviewed ID/medical situation again with patient. Also reviewed his cognitive deficits. 2. DVT Prophylaxis/Anticoagulation: Eliquis 3. Pain Management:Lidoderm patch. Oxycodone as needed 4. Mood: Provide emotional support 5. Neuropsych: This patient is somewhat capable of making decisions on hisown behalf. 6. Skin/Wound Care: Routine skin  checks 7. Fluids/Electrolytes/Nutrition: Routine I&Os  Follow BMP 8.Infectious disease: plan is for6 weeks penicillin G per infectious disease  TEE without vegetation.   -zovirax cream for cold sores added 9.New-onset atrial fibrillation as well as history of CAD with stenting. Cardizem 90 mg every 6 hours, Lopressor 125 mg twice a day. Cardiac rate control. Follow-up cardiology services -monitor for exercise tolerance with PT, check VS as needed 10.Hypertension. Lisinopril 20 mg daily and monitor with increased mobility  Controlled 2/11 11.Hyperlipidemia. Lipitor 12.Constipation. Laxative assistance 13. ABLA  Hb 12.2 on 2/7   Cont to monitor 14. Hypoalbuminemia  Supplement initiated 2/10 15. GERD  Protonix changed to BID    LOS (Days) 4 A FACE TO FACE EVALUATION WAS PERFORMED  Freida Nebel T 03/03/2016 9:58 AM

## 2016-03-03 NOTE — Progress Notes (Signed)
Physical Therapy Session Note  Patient Details  Name: Jonathan Holden MRN: [id number] Date of Birth: [birth year]  Today's Date: 03/03/2016 PT Individual Time: 1300-1405 PT Individual Time Calculation (min): 65 min   Short Term Goals: Week 1:  PT Short Term Goal 1 (Week 1): Pt will be able to perform bed mobility with supervision PT Short Term Goal 2 (Week 1): Pt will be able to perform basic transfers with min assist and LRAD PT Short Term Goal 3 (Week 1): Pt will be able to gait x 150' with min assist with LRAD PT Short Term Goal 4 (Week 1): Pt will be able to complete 12 steps for strengthening with rails with min assist  Skilled Therapeutic Interventions/Progress Updates: Pt received seated in bed, c/o pain as below and agreeable to treatment. Supine>sit with logroll and S with bed flat. Pt dons back brace in sitting with S and min cues for tightening straps. Stand pivot transfer with RW and min guard faded to S. W/c propulsion to gym with BUE and S x175'. Stairs ascent/descent 20 steps 6" height with B handrails and min guard faded to S. Lateral step ups BLE 6" step with B handrails, 1x15 each side. Returned to room urgently d/t urgency to have bowel movement. Stand pivot transfer close S >w/c with therapist supervising; transfer to return to w/c with S provided by girlfriend. Checked off SO for stand pivot transfers while present. Pt declines leaving room as he was unable to have bowel movement, and concerned he will need to go again soon. Provided pt with decaf coffee per request as he reports he helps him have a bowel movement. Remained seated in w/c at end of session, SO present and all needs in reach.      Therapy Documentation Precautions:  Precautions Precautions: Fall Required Braces or Orthoses: Spinal Brace Spinal Brace: Lumbar corset, Applied in sitting position Restrictions Weight Bearing Restrictions: No Pain: Pain Assessment Pain Assessment: No/denies pain Pain Score: 5   Pain Type: Acute pain Pain Location: Back Pain Orientation: Lower Pain Descriptors / Indicators: Discomfort Pain Intervention(s): Repositioned   See Function Navigator for Current Functional Status.   Therapy/Group: Individual Therapy  Luberta Mutter 03/03/2016, 2:07 PM

## 2016-03-03 NOTE — Progress Notes (Signed)
Occupational Therapy Session Note  Patient Details  Name: Jonathan Holden MRN: [id number] Date of Birth: 1945-07-22  Today's Date: 03/03/2016 OT Individual Time: 1100-1201 OT Individual Time Calculation (min): 61 min    Short Term Goals: Week 1:  OT Short Term Goal 1 (Week 1): Pt will ambulate and gather all needed clothing items in prep for bathing/dressing task with supervision OT Short Term Goal 2 (Week 1): Pt will don LSO mod I for increased independence with ADLs OT Short Term Goal 3 (Week 1): Pt will complete 2 grooming tasks in standing with distant supervision OT Short Term Goal 4 (Week 1): Pt will attend to functional task for 3 minutes with no more than 1 VC for redirection  Skilled Therapeutic Interventions/Progress Updates:   Pt worked on sponge bathing sit to stand at the sink secondary to being hooked up to the PICC line.   He needed mod instructional cueing for sequencing supine to sit following recommended back precautions.  He completed transfer to the wheelchair squat pivot with supervision.  Bathing sit to stand at the sink with min assist.  He was able to cross LEs over the opposite knee for removing dirty socks, washing feet, and donning new socks.  He transferred back to the bed to complete session per his request secondary to increased back pain.  Discussed working on increasing sitting endurance as well in future sessions in order to prepare for discharge home and eventual return to work.  Pt voiced understanding at this time.  Call button and phone in reach.  Bed alarm also in place.    Therapy Documentation Precautions:  Precautions Precautions: Fall Required Braces or Orthoses: Spinal Brace Spinal Brace: Lumbar corset, Applied in sitting position Restrictions Weight Bearing Restrictions: No  Pain: Pain Assessment Pain Assessment: No/denies pain Pain Score: 5  Pain Type: Acute pain Pain Location: Back Pain Orientation: Lower Pain Descriptors / Indicators:  Discomfort Pain Intervention(s): Repositioned ADL: See Function Navigator for Current Functional Status.   Therapy/Group: Individual Therapy  Rheda Kassab OTR/L 03/03/2016, 12:39 PM

## 2016-03-04 ENCOUNTER — Inpatient Hospital Stay (HOSPITAL_COMMUNITY): Payer: Medicare Other | Admitting: Physical Therapy

## 2016-03-04 ENCOUNTER — Ambulatory Visit (HOSPITAL_COMMUNITY): Payer: Medicare Other | Admitting: Psychology

## 2016-03-04 ENCOUNTER — Inpatient Hospital Stay (HOSPITAL_COMMUNITY): Payer: Medicare Other | Admitting: Speech Pathology

## 2016-03-04 ENCOUNTER — Inpatient Hospital Stay (HOSPITAL_COMMUNITY): Payer: Medicare Other

## 2016-03-04 ENCOUNTER — Inpatient Hospital Stay (HOSPITAL_COMMUNITY): Payer: Medicare Other | Admitting: Occupational Therapy

## 2016-03-04 DIAGNOSIS — G9340 Encephalopathy, unspecified: Secondary | ICD-10-CM

## 2016-03-04 DIAGNOSIS — M4630 Infection of intervertebral disc (pyogenic), site unspecified: Secondary | ICD-10-CM

## 2016-03-04 DIAGNOSIS — F09 Unspecified mental disorder due to known physiological condition: Secondary | ICD-10-CM

## 2016-03-04 MED ORDER — MELATONIN 3 MG PO TABS
9.0000 mg | ORAL_TABLET | Freq: Every evening | ORAL | Status: DC | PRN
  Administered 2016-03-05 – 2016-03-07 (×3): 9 mg via ORAL
  Filled 2016-03-04 (×4): qty 6

## 2016-03-04 MED ORDER — MELATONIN 3 MG PO TABS
9.0000 mg | ORAL_TABLET | Freq: Every evening | ORAL | Status: DC | PRN
  Administered 2016-03-04: 9 mg via ORAL
  Filled 2016-03-04 (×2): qty 6

## 2016-03-04 NOTE — Progress Notes (Signed)
Social Work Patient ID: Jonathan Holden, male   DOB: 08-26-1945, 71 y.o.   MRN: [id number]  Met with pt to discuss team conference goals su[pervision level and discharge target date 2/19. Pt wants this worker to be mindful not to pile on too much on Mollie-girlfriend. He is aware have left her a message regaridng his discharge needs. Will await for her to contact me back. Will begin working on Setting up IV antibiotics at home and follow up therapies.

## 2016-03-04 NOTE — Progress Notes (Signed)
Speech Language Pathology Daily Session Note  Patient Details  Name: Jonathan Holden MRN: [id number] Date of Birth: Jan 01, 1946  Today's Date: 03/04/2016 SLP Individual Time: [id number] SLP Individual Time Calculation (min): 45 min  Short Term Goals: Week 1: SLP Short Term Goal 1 (Week 1): Pt will itilize external aides for recall of basic, daily as well as new informaiton with Min A verbal cues. SLP Short Term Goal 2 (Week 1): Pt will demonstrate selective attention to task in moderately distracting environment for ~30 Minutes iwth Min A verbal cues for redirections.  SLP Short Term Goal 3 (Week 1): Pt will complete complex medication management tasks with Min A verbal cues.  SLP Short Term Goal 4 (Week 1): Pt will complete complex money management tasks with Min A verbal cues.  SLP Short Term Goal 5 (Week 1): Pt will demonstrate anticipatory awareness by stating 3 activites that pt can safety participate in within home setting.   Skilled Therapeutic Interventions: Skilled treatment session focused on addressing cognition goals. Upon SLP arrival patient reported needing to go to the bathroom; SLP facilitated session by providing Min assist verbal cues to sequence transfer due to extra lines: IV and cath and back brace.  Patient reported that by the time he got to the toilet he no longer needed to go. SLP also facilitated session with Min assist verbal cues for mental flexibility during completion of medication management task that was started yesterday.  Patient required Supervision to utilize external memory aid for recall of new information and was Mod I for accuracy with load box.  Continue with current plan of care.    Function:   Cognition Comprehension Comprehension assist level: Understands basic 90% of the time/cues < 10% of the time  Expression   Expression assist level: Expresses complex 90% of the time/cues < 10% of the time;Expresses basic 90% of the time/requires cueing < 10% of  the time.  Social Interaction Social Interaction assist level: Interacts appropriately 75 - 89% of the time - Needs redirection for appropriate language or to initiate interaction.  Problem Solving Problem solving assist level: Solves basic 75 - 89% of the time/requires cueing 10 - 24% of the time  Memory Memory assist level: Recognizes or recalls 75 - 89% of the time/requires cueing 10 - 24% of the time    Pain Pain Assessment Pain Assessment: No/denies pain Pain Score: 0-No pain  Therapy/Group: Individual Therapy  Carmelia Roller., CCC-SLP [id number]  [place] 03/04/2016, 9:35 AM

## 2016-03-04 NOTE — Progress Notes (Signed)
Nurse spoke with on call Md, Marlowe [place] about getting melatonin restarted as family member and patient wants medication now. MD TVORB okay to restart order now.

## 2016-03-04 NOTE — Progress Notes (Signed)
Physical Therapy Session Note  Patient Details  Name: Jonathan Holden MRN: [id number] Date of Birth: Sep 12, 1945  Today's Date: 03/04/2016 PT Individual Time: 1502-1605 PT Individual Time Calculation (min): 63 min   Short Term Goals: Week 1:  PT Short Term Goal 1 (Week 1): Pt will be able to perform bed mobility with supervision PT Short Term Goal 2 (Week 1): Pt will be able to perform basic transfers with min assist and LRAD PT Short Term Goal 3 (Week 1): Pt will be able to gait x 150' with min assist with LRAD PT Short Term Goal 4 (Week 1): Pt will be able to complete 12 steps for strengthening with rails with min assist  Skilled Therapeutic Interventions/Progress Updates:    Pt received supine in bed and agreeable to PT. Supine>sit transfer with supervision assist and mod cues.   PT instructed patient in [facility] mobility with BUE propulsion and supervision assist with min cues for doorway management x 145f.    Gait training instructed by PT  in rehab gym x 1555fand supervision assist with RW. Min cues for safety in turns to prevent lateral/posterior LOB. [facility] with [facility] and intermittent Supervision-min assist. Patient noted to have one near LOBoltonhich he self corrected due to   Dynamic standing balance to perform lateral and anterior stepping to target 2x 6 BLE. Toe taping on 1 cones x BLE, followed by toe tapping on 1 1of 2 cones in random order. Throughout balance training PT provided min assist for increaed safety as well as improved weight shifting   Patient returned to room and left supine in bed  with call bell in reach and all needs met.              Therapy Documentation Precautions:  Precautions Precautions: Fall Required Braces or Orthoses: Spinal Brace Spinal Brace: Lumbar corset, Applied in sitting position Restrictions Weight Bearing Restrictions: No Vital Signs: Therapy Vitals Temp: 97.8 F (36.6 C) Temp  Source: Oral Pulse Rate: 65 Resp: 18 BP: (!) 89/51 Patient Position (if appropriate): Lying Oxygen Therapy SpO2: 99 % O2 Device: Not Delivered Pain:   6/10   See Function Navigator for Current Functional Status.   Therapy/Group: Individual Therapy  AuLorie Phenix/14/2018, 5:56 PM

## 2016-03-04 NOTE — Progress Notes (Signed)
Physical Therapy Note  Patient Details  Name: Jonathan Holden MRN: [id number] Date of Birth: [birth year] Today's Date: 03/04/2016  1105-1135, 30 min individual tx Pain: 5/10 low back, premedicated  Gait with RW x 90' , x 80' over level tile and carpet.  Standing endurance activity to manipulate hangars at bar in closet, with RW.  Up/down 12 steps 2 rails.  Standing therapeutic exercises performed with bil LE to increase strength for functional mobility: 10 x 1 each heel raises, toe raises with bil UE support on RW; seated 10 x 1 each marching, long arc quad knee ext with ankle DF at end range, alternating toe/heel taps.  Pt had difficulty coordinating alternating movements R/L foot.  Pt left resting in w/c with all needs within reach.  See function navigator for current status.   Nitesh Pitstick 03/04/2016, 12:30 PM

## 2016-03-04 NOTE — Progress Notes (Signed)
Occupational Therapy Session Note  Patient Details  Name: Jonathan Holden MRN: [id number] Date of Birth: 1945/05/17  Today's Date: 03/04/2016 OT Individual Time: 1300-1415 OT Individual Time Calculation (min): 75 min    Short Term Goals: Week 1:  OT Short Term Goal 1 (Week 1): Pt will ambulate and gather all needed clothing items in prep for bathing/dressing task with supervision OT Short Term Goal 2 (Week 1): Pt will don LSO mod I for increased independence with ADLs OT Short Term Goal 3 (Week 1): Pt will complete 2 grooming tasks in standing with distant supervision OT Short Term Goal 4 (Week 1): Pt will attend to functional task for 3 minutes with no more than 1 VC for redirection  Skilled Therapeutic Interventions/Progress Updates:   Pt completed bathing and dressing during session.  Encouraged pt to work on ambulation to gather clothing and then ambulate to the shower for bathing.  Min instructional cueing for sequencing bathing sit to stand.  He was able to complete dressing sit to stand from the EOB with close supervision.  Completed grooming in standing for applying lotion to face and for deodorant.  Pt transferred back to the bed with supervision for rest break.  Had pt donn back brace for transfer to the [facility] gym to work on divided attention using the [place].  In standing, he was able to locate 42/46 red lights in 1-1.5 seconds but was only able to state 7-8/10 2 digit numbers correctly while pressing out the lights.  Transferred back to the room at end of session with pt transferring back to bed with rest before PT session in 45 mins.  Bed alarm in place with call button and phone in reach.       Therapy Documentation Precautions:  Precautions Precautions: Fall Required Braces or Orthoses: Spinal Brace Spinal Brace: Lumbar corset, Applied in sitting position Restrictions Weight Bearing Restrictions: No  Pain: Pain Assessment Pain Assessment: No/denies pain Pain Score: 2   Pain Type: Acute pain Pain Location: Back Pain Orientation: Lower Pain Descriptors / Indicators: Discomfort Pain Frequency: Intermittent Pain Onset: On-going Pain Intervention(s): Medication (See eMAR) ADL: See Function Navigator for Current Functional Status.   Therapy/Group: Individual Therapy  Audi Conover OTR/L 03/04/2016, 3:59 PM

## 2016-03-04 NOTE — Patient Care Conference (Signed)
Inpatient RehabilitationTeam Conference and Plan of Care Update Date: 03/04/2016   Time: 10:55 AM    Patient Name: Jonathan Holden      Medical Record Number: [id number]  Date of Birth: [birth year] Sex: Male         Room/Bed: 4W18C/4W18C-01 Payor Info: Payor: MEDICARE / Plan: MEDICARE PART A AND B / Product Type: *No Product type* /    Admitting Diagnosis: Discitis  Admit Date/Time:  02/28/2016  6:29 PM Admission Comments: No comment available   Primary Diagnosis:  <principal problem not specified> Principal Problem: <principal problem not specified>  Patient Active Problem List   Diagnosis Date Noted  . Gastroesophageal reflux disease   . Acute blood loss anemia   . Hypoalbuminemia due to protein-calorie malnutrition ([place])   . Benign essential HTN   . New onset a-fib ([place])   . Bacteremia   . Pulmonary hypertension   . Low back pain   . Ileus ([place])   . Endocarditis of native valve   . Infection of intervertebral disc ([place])   . Streptococcal bacteremia   . Lumbar discitis   . Sepsis ([place]) 02/21/2016  . Hyponatremia 02/21/2016  . Atrial fibrillation with RVR ([place]) 02/21/2016  . Back pain 02/21/2016  . Acute encephalopathy 02/21/2016  . CAD (coronary artery disease), native coronary artery 09/19/2014  . Essential hypertension 09/19/2014  . Hyperlipidemia 10/03/2013    Expected Discharge Date: Expected Discharge Date: 03/09/16  Team Members Present: Physician leading conference: Dr. Delice Lesch Social Worker Present: Ovidio Kin, LCSW Nurse Present: Heather Roberts, RN PT Present: Barrie Folk, PT;Caroline Lacinda Axon, Dustin Folks, PT OT Present: Clyda Greener, OT SLP Present: Gunnar Fusi, SLP PPS Coordinator present : Daiva Nakayama, RN, CRRN     Current Status/Progress Goal Weekly Team Focus  Medical   lumbar diskitis, encephalopathy, on pen G for 6 weeks. improving pain and cognitive status, in LSO  improve functional mobility, pain  ID mgt, continued pain mgt, memory  and cognition   Bowel/Bladder   Contintnent of bowel and bladder. LBm 02/29/16. Request use of condom cath at [facility] bowel and bladder  Monitor    Swallow/Nutrition/ Hydration             ADL's   Min guard to supervision for bathing and dressing sit to stand.  Decreased endurance, decreased selective attention.    supervision overall  selfcare retraining, balance retraining, transfer training, pt/family education, cognitive retraining   Mobility   Supervision with occasional min assist due to pain and fatigue for transfers and gait up to 275ft with RW. Supervision assist for bed mobility.   Supervision assist overall with RW.   balance, endurance, pain management, LE strengthening, safety with transfers and gait using RW.    Communication             Safety/Cognition/ Behavioral Observations  Mod A to Min A  Mod I  complex problem solving, aternating attention, recall of complex information   Pain   No c/o pain  <3  Monitor for nonverbal cues of pain   Skin   Rash to lower back and R foot  No additional skin breakdown  Assess skin q shift      *See Care Plan and progress notes for long and short-term goals.  Barriers to Discharge: back precautions, safety awareness    Possible Resolutions to Barriers:  education re: LSO, ongoing cognitive remediation    Discharge Planning/Teaching Needs:  Home with significant other who is here daily  and works from home so can provide 24 hr supervision at discharge.      Team Discussion:  Goals of supervision level. Decreased endurance, balance and selective attention. At times needs cues. MD-BP controlled and changed protonix to BID. Fluids at night wants condom cath on then. Back pain-brace for comfort. IV antibiotics for 6 weeks. Pain is managed with meds.  Revisions to Treatment Plan:  DC 2/19   Continued Need for Acute Rehabilitation Level of Care: The patient requires daily medical management by a physician with specialized  training in physical medicine and rehabilitation for the following conditions: Daily direction of a multidisciplinary physical rehabilitation program to ensure safe treatment while eliciting the highest outcome that is of practical value to the patient.: Yes Daily medical management of patient stability for increased activity during participation in an intensive rehabilitation regime.: Yes Daily analysis of laboratory values and/or radiology reports with any subsequent need for medication adjustment of medical intervention for : Wound care problems;Post surgical problems  Kerline Trahan, Gardiner Rhyme 03/06/2016, 9:02 AM

## 2016-03-04 NOTE — Progress Notes (Signed)
Neuropsychogical Assessment  Patient:   Jonathan Holden   DOB:   12/28/1945  MR Number:  [id number]  Location:  [facility] [address] [id number] [place] [address] Dept: [phone] [place]: [phone]           Date of Service:   03/04/2016  Start Time:   9 AM End Time:   10 AM  Provider/Observer:  Edgardo Roys PSYD       Billing Code/Service: [phone]  Chief Complaint:     Chief Complaint  Patient presents with  . Memory Loss  . Adjustment Disorder    Reason for Service:  The patient was referred for a psychological/neuropsychological consultation.  The patient is a 71 yo male that was admitted due to and infection in his intervertebral disc and acute encephalophy due to sepsis.   He had severe altered mental status and neurological symptoms in his legs.  The patient has shown some significant improvement in cognitive performance, but he is still having adjustment issues as he improves from the cns infection.    Cognitive disorder  Current Status:  The patient was seen today with what appeared to be improved mental status.  He was able to fully expressive himself and his concerns.  He appeared to have good verbal reception skills.  He was oriented.  He expressed somewhat unrealistic goals but did acknowledge understanding of what he has been going through.  Reliability of Information: Information for medical records, 1 hour clinical interview with patient and review with treating staff members.  Behavioral Observation: Jonathan Holden  presents as a 71 y.o.-year-old Right Caucasian Male who appeared his stated age. his dress was Appropriate and he was Well Groomed and his manners were Appropriate to the situation.  his participation was indicative of Monopolizing behaviors.  There were  physical disabilities noted.  he displayed an appropriate level of cooperation and motivation.      Interactions:    Active Appropriate but somewhat controlling of the direction of questions and direction of discussions.  Attention:   within normal limits and attention span and concentration were age appropriate  Memory:   The patient did have some disturbace around memory related to onset of his illness.. 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  Orientation:   person, place, time/date and situation  Judgment:   Fair  Planning:   Fair  Affect:    Defensive  Mood:    Anxious  Insight:   Fair  Intelligence:   high  Substance Use:  No concerns of substance  abuse are reported.    Education:   Engineering geologist History:   Past Medical History:  Diagnosis Date  . Arthritis HANDS AND RIGHT SHOULDER  . Coronary artery disease CARDIOLOGIST- DR Daneen Schick- LAST VISIT 6 MON AGO--  REQUESTED NOTE, EKG, ECHO, STRESS TEST   PT DENIES S & S  . H/O gastric ulcer   . History of MI (myocardial infarction) 1992  . Hyperlipidemia   . Hypertension   . Loose body of right shoulder   . S/P coronary angioplasty         Facility-Administered Encounter Medications as of 03/04/2016  Medication  .  acetaminophen (TYLENOL) tablet 650 mg   Or  . acetaminophen (TYLENOL) suppository 650 mg  . acyclovir ointment (ZOVIRAX) 5 %  . apixaban (ELIQUIS) tablet 5 mg  . atorvastatin (LIPITOR) tablet 40 mg  . calcium carbonate (TUMS - dosed in mg elemental calcium) chewable tablet 200 mg of elemental calcium  . diltiazem (CARDIZEM) tablet 90 mg  . feeding supplement (PRO-STAT SUGAR FREE 64) liquid 30 mL  . folic acid (FOLVITE) tablet 1 mg  . lidocaine (LIDODERM) 5 % 2 patch  . lisinopril (PRINIVIL,ZESTRIL) tablet 20 mg  . loperamide (IMODIUM) capsule 2 mg  . MEDLINE mouth rinse  . Melatonin TABS 9-18 mg  . metoprolol tartrate (LOPRESSOR) tablet 125 mg  . multivitamin with minerals tablet 1 tablet  . ondansetron (ZOFRAN) tablet 4 mg   Or  . ondansetron (ZOFRAN) injection 4 mg  . oxyCODONE-acetaminophen (PERCOCET/ROXICET) 5-325 MG per tablet 1 tablet  . pantoprazole (PROTONIX) EC tablet 40 mg  . penicillin G potassium 4 Million Units in dextrose 5 % 250 mL IVPB  . polyethylene glycol (MIRALAX / GLYCOLAX) packet 17 g  . prednisoLONE acetate (PRED FORTE) 1 % ophthalmic suspension 1 drop  . sodium chloride flush (NS) 0.9 % injection 10-40 mL  . sodium chloride flush (NS) 0.9 % injection 10-40 mL  . sorbitol 70 % solution 30 mL  . thiamine (VITAMIN B-1) tablet 100 mg   Outpatient Encounter Prescriptions as of 03/04/2016  Medication Sig  . Alirocumab (PRALUENT) 150 MG/ML SOPN Inject 150 mg into the skin every 14 (fourteen) days.  Marland Kitchen apixaban (ELIQUIS) 5 MG TABS tablet Take 1 tablet (5 mg total) by mouth 2 (two) times daily.  Marland Kitchen atorvastatin (LIPITOR) 40 MG tablet Take 1 tablet (40 mg total) by mouth daily.  . Coenzyme Q10 (COQ10 PO) Take 1 capsule by mouth daily.  . cyclobenzaprine (FLEXERIL) 10 MG tablet Take 1 tablet (10 mg total) by mouth 2 (two) times daily.  Marland Kitchen diltiazem (CARDIZEM) 90 MG tablet Take 1 tablet (90 mg total) by mouth every 6 (six) hours.  . famotidine (PEPCID) 20 MG tablet Take  1 tablet (20 mg total) by mouth 2 (two) times daily.  . folic acid (FOLVITE) 1 MG tablet Take 1 tablet (1 mg total) by mouth daily.  Marland Kitchen GLUCOSAMINE-CHONDROITIN PO Take 2 tablets by mouth daily.  Marland Kitchen lidocaine (LIDODERM) 5 % Place 2 patches onto the skin daily. Remove & Discard patch within 12 hours or as directed by MD  . lisinopril (PRINIVIL,ZESTRIL) 20 MG tablet Take 1 tablet (20 mg total) by mouth daily.  Marland Kitchen loperamide (IMODIUM) 2 MG capsule Take 1 capsule (2 mg total) by mouth as needed for diarrhea or loose stools.  . Melatonin 3 MG TABS Take 3 tablets (9 mg total) by mouth at bedtime as needed (sleep).  . metoprolol tartrate (LOPRESSOR) 25 MG tablet  Take 5 tablets (125 mg total) by mouth 2 (two) times daily.  . Multiple Vitamin (MULTIVITAMIN WITH MINERALS) TABS tablet Take 1 tablet by mouth daily. Centrum  . Omega-3 Fatty Acids (FISH OIL) 1000 MG CAPS Take 1,000 mg by mouth daily.  . ondansetron (ZOFRAN) 4 MG tablet Take 1 tablet (4 mg total) by mouth every 6 (six) hours as needed for nausea.  Marland Kitchen oxyCODONE-acetaminophen (PERCOCET/ROXICET) 5-325 MG tablet Take 1 tablet by mouth every 4 (four) hours as needed for severe pain.  . pantoprazole (PROTONIX) 40 MG tablet Take 1 tablet (40 mg total) by mouth daily at 12 noon.  . penicillin G IVPB Inject 24 Million Units into the vein daily. As continuous infusion Indication:  Bacteremia, endocarditis, osteomyelitis  Last Day of Therapy:  04/05/16 Labs - Once weekly:  CBC/D and BMP, Labs - Every other week:  ESR and CRP  . prednisoLONE acetate (PRED FORTE) 1 % ophthalmic suspension Place 1 drop into the left eye 3 (three) times daily.  Marland Kitchen thiamine 100 MG tablet Take 1 tablet (100 mg total) by mouth daily.          Sexual History:   History  Sexual Activity  . Sexual activity: Not on file    Psychiatric History:  No indication of prior psychiatric illness.  Family Med/Psych History:  Family History  Problem Relation Age of Onset  . Other Mother      AGE 13 HEALTHY  . Heart attack Father 49    2 MI  . Emphysema    . Other Brother     HEALTHY    Risk of Suicide/Violence: virtually non-existent , the patient denies any suicidal or homicidal ideation.   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 The patient describes embarrassment around how poorly he was functioning from a cognitive standpoint and intellectual standpoint during the initial phases of his infection. He is quite concerned about how others view him and concerns about people viewing him as incompetent at this point even though he is beginning to return to baseline cognitive functioning. The impression is one of a significant sepsis condition with marked deterioration in cognitive and neurological functioning with acute infection in his back affecting motor control.  Disposition/Plan:  We began working on psychotherapeutic and neuropsychological efforts to help him cope with where he is at this moment and to deal with what has happened to him acutely. He asked that we continue these discussions going forward if at all possible. I have agreed to see him again if I can prior to his discharge.  Diagnosis:    Acute encephalopathy  Infection of intervertebral disc (HCC)  Cognitive disorder         Electronically Signed   _______________________ Ilean Skill, Psy.D.

## 2016-03-04 NOTE — Progress Notes (Signed)
Jonathan Holden     PROGRESS NOTE  Subjective/Complaints:  No new complaints. Anticipating the results of team conference today. Feels that hes' moving better  ROS: pt denies nausea, vomiting, diarrhea, cough, shortness of breath or chest pain     Objective: Vital Signs: Blood pressure 124/75, pulse 75, temperature 97.7 F (36.5 C), temperature source Oral, resp. rate 18, weight 91.9 kg (202 lb 11.2 oz), SpO2 96 %. No results found.  Recent Labs  03/02/16 1128  WBC 9.7  HGB 11.7*  HCT 35.4*  PLT 422*    Recent Labs  03/02/16 1128  NA 132*  K 4.6  CL 100*  GLUCOSE 135*  BUN 12  CREATININE 0.90  CALCIUM 8.9   CBG (last 3)  No results for input(s): GLUCAP in the last 72 hours.  Wt Readings from Last 3 Encounters:  03/03/16 91.9 kg (202 lb 11.2 oz)  02/28/16 96.5 kg (212 lb 11.9 oz)  02/17/16 95.3 kg (210 lb)    Physical Exam:  BP 124/75 (BP Location: Right Arm)   Pulse 75   Temp 97.7 F (36.5 C) (Oral)   Resp 18   Wt 91.9 kg (202 lb 11.2 oz)   SpO2 96%   BMI 26.74 kg/m  Constitutional: He appears well-developed. No distress.  HENT: Normocephalicand atraumatic. Cold sores around upper lip Eyes: EOMI. No discharge.  Cardiovascular: IRR Respiratory: Effort normaland breath sounds normal.  GI: Soft. Bowel sounds are normal.  Musculoskeletal:  Low back somewhat tender. No LE edema Skin: Skin is warm. He is not diaphoretic.  Psychiatric:  Cooperative. STM deficits with some improvement. Needs cues still Neurological: He is alert and oriented.  Motor: 5/5 bilateral deltoid, biceps, tricesp, wrist, hand intrinsics 4/5 bilateral hip flexors, knee extensors, ankle dorsiflexor  Sensation intact to light touch bilateral lower limbs to light touch. Motor and sensory exam stable.   Assessment/Plan: 1. Functional deficits secondary to discitis and ecephalopathy which require 3+ hours per day of interdisciplinary therapy in a  comprehensive inpatient rehab setting. Physiatrist is providing close team supervision and 24 hour management of active medical problems listed below. Physiatrist and rehab team continue to assess barriers to discharge/monitor patient progress toward functional and medical goals.  Function:  Bathing Bathing position   Position: Wheelchair/chair at sink  Bathing parts Body parts bathed by patient: Right arm, Left arm, Chest, Abdomen, Buttocks, Front perineal area, Right upper leg, Left upper leg, Right lower leg, Left lower leg Body parts bathed by helper: Back  Bathing assist Assist Level: Supervision or verbal cues      Upper Body Dressing/Undressing Upper body dressing   What is the patient wearing?: Pull over shirt/dress     Pull over shirt/dress - Perfomed by patient: Thread/unthread right sleeve, Thread/unthread left sleeve, Put head through opening, Pull shirt over trunk       Orthosis activity level: Performed by patient  Upper body assist Assist Level: Supervision or verbal cues   Set up : To obtain clothing/put away, To apply TLSO, cervical collar  Lower Body Dressing/Undressing Lower body dressing   What is the patient wearing?: Pants, Non-skid slipper socks Underwear - Performed by patient: Thread/unthread left underwear leg, Pull underwear up/down Underwear - Performed by helper: Thread/unthread right underwear leg Pants- Performed by patient: Thread/unthread right pants leg, Thread/unthread left pants leg, Pull pants up/down, Fasten/unfasten pants   Non-skid slipper socks- Performed by patient: Don/doff right sock, Don/doff left sock Non-skid slipper socks- Performed by helper:  Don/doff right sock, Don/doff left sock Socks - Performed by patient: Don/doff right sock, Don/doff left sock                Lower body assist Assist for lower body dressing: Touching or steadying assistance (Pt > 75%)      Toileting Toileting Toileting activity did not occur: No  continent bowel/bladder event Toileting steps completed by patient: Performs perineal hygiene (per Jonathan Holden, NT report) Toileting steps completed by helper: Adjust clothing prior to toileting, Adjust clothing after toileting (per Jonathan Holden, NT report)    Toileting assist Assist level: Touching or steadying assistance (Pt.75%)   Transfers Chair/bed transfer   Chair/bed transfer method: Stand pivot Chair/bed transfer assist level: Supervision or verbal cues Chair/bed transfer assistive device: Walker, Air cabin crew     Max distance: 120' Assist level: Touching or steadying assistance (Pt > 75%)   Wheelchair   Type: Manual Max wheelchair distance: 188ft  Assist Level: Touching or steadying assistance (Pt > 75%)  Cognition Comprehension Comprehension assist level: Understands basic 90% of the time/cues < 10% of the time  Expression Expression assist level: Expresses complex 90% of the time/cues < 10% of the time, Expresses basic 90% of the time/requires cueing < 10% of the time.  Social Interaction Social Interaction assist level: Interacts appropriately 75 - 89% of the time - Needs redirection for appropriate language or to initiate interaction.  Problem Solving Problem solving assist level: Solves basic 75 - 89% of the time/requires cueing 10 - 24% of the time  Memory Memory assist level: Recognizes or recalls 75 - 89% of the time/requires cueing 10 - 24% of the time    Medical Problem List and Plan: 1. 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 Lumbar corset applied in sitting position  Cont CIR therapies.    -showing some improvement in cognition 2. DVT Prophylaxis/Anticoagulation: Eliquis 3. Pain Management:Lidoderm patch. Oxycodone as needed 4. Mood: Provide emotional support 5. Neuropsych: This patient is somewhat capable of making decisions on hisown behalf. 6. Skin/Wound  Care: Routine skin checks 7. Fluids/Electrolytes/Nutrition: Routine I&Os  Follow BMP 8.Infectious disease: plan is for6 weeks penicillin G per infectious disease  TEE without vegetation.   -zovirax cream for cold sores added 9.New-onset atrial fibrillation as well as history of CAD with stenting. Cardizem 90 mg every 6 hours, Lopressor 125 mg twice a day. Cardiac rate control. Follow-up cardiology services -monitor for exercise tolerance with PT, check VS as needed 10.Hypertension. Lisinopril 20 mg daily and monitor with increased mobility  Controlled 2/11 11.Hyperlipidemia. Lipitor 12.Constipation. Laxative assistance 13. ABLA  Hb 12.2 on 2/7   Cont to monitor 14. Hypoalbuminemia  Supplement initiated 2/10 15. GERD  Protonix changed to BID    LOS (Days) 5 A FACE TO FACE EVALUATION WAS PERFORMED  Jonathan Holden 03/04/2016 12:28 PM

## 2016-03-05 ENCOUNTER — Inpatient Hospital Stay (HOSPITAL_COMMUNITY): Payer: Medicare Other | Admitting: Physical Therapy

## 2016-03-05 ENCOUNTER — Inpatient Hospital Stay (HOSPITAL_COMMUNITY): Payer: Medicare Other

## 2016-03-05 ENCOUNTER — Inpatient Hospital Stay (HOSPITAL_COMMUNITY): Payer: Medicare Other | Admitting: Occupational Therapy

## 2016-03-05 ENCOUNTER — Inpatient Hospital Stay (HOSPITAL_COMMUNITY): Payer: Medicare Other | Admitting: Speech Pathology

## 2016-03-05 MED ORDER — OXYCODONE HCL 5 MG PO TABS
5.0000 mg | ORAL_TABLET | Freq: Two times a day (BID) | ORAL | Status: DC
  Administered 2016-03-05 – 2016-03-08 (×7): 5 mg via ORAL
  Filled 2016-03-05 (×9): qty 1

## 2016-03-05 NOTE — Progress Notes (Signed)
Occupational Therapy Session Note  Patient Details  Name: Jonathan Holden MRN: [id number] Date of Birth: 04-Aug-1945  Today's Date: 03/05/2016 OT Individual Time: [id number] OT Individual Time Calculation (min): 54 min    Short Term Goals: Week 1:  OT Short Term Goal 1 (Week 1): Pt will ambulate and gather all needed clothing items in prep for bathing/dressing task with supervision OT Short Term Goal 2 (Week 1): Pt will don LSO mod I for increased independence with ADLs OT Short Term Goal 3 (Week 1): Pt will complete 2 grooming tasks in standing with distant supervision OT Short Term Goal 4 (Week 1): Pt will attend to functional task for 3 minutes with no more than 1 VC for redirection  Skilled Therapeutic Interventions/Progress Updates:    Pt completed transfer from supine to sit EOB with supervision.  He was able to donn his back brace with supervision and then ambulated to the dayroom with supervision.  He was able to complete 2 intervals of standing with use of the Wii and the balance board with min assist to work on upright posture, standing endurance, and ankle/hip strategies.  He was only able to tolerate standing for 3 mins before needing to sit down.  He then stated his pain level had increased from 5-7 and he stated he could not tolerate any further sitting or standing up.  Returned to the room and pt transferred back to the bed with supervision.  Nursing came in as well as MD, discussed pain level and decreased ability to tolerate OOB for therapy for the entire session.  Finished session with pt in bed and bed alarm in place.    Therapy Documentation Precautions:  Precautions Precautions: Fall Required Braces or Orthoses: Spinal Brace Spinal Brace: Lumbar corset, Applied in sitting position Restrictions Weight Bearing Restrictions: No  Pain: Pain Assessment Pain Assessment: 0-10 Pain Score: 4  Pain Type: Acute pain Pain Location: Back Pain Orientation: Lower Pain  Descriptors / Indicators: Aching;Discomfort Pain Frequency: Intermittent Pain Onset: On-going Pain Intervention(s): Medication (See eMAR) Multiple Pain Sites: No ADL: See Function Navigator for Current Functional Status.   Therapy/Group: Individual Therapy  Marayah Higdon OTR/L 03/05/2016, 12:12 PM

## 2016-03-05 NOTE — Progress Notes (Signed)
Physical Therapy Session Note  Patient Details  Name: Jonathan Holden MRN: [id number] Date of Birth: Feb 27, 1945  Today's Date: 03/05/2016 PT Individual Time: 0801-0900 PT Individual Time Calculation (min): 59 min   Short Term Goals: Week 1:  PT Short Term Goal 1 (Week 1): Pt will be able to perform bed mobility with supervision PT Short Term Goal 2 (Week 1): Pt will be able to perform basic transfers with min assist and LRAD PT Short Term Goal 3 (Week 1): Pt will be able to gait x 150' with min assist with LRAD PT Short Term Goal 4 (Week 1): Pt will be able to complete 12 steps for strengthening with rails with min assist  Skilled Therapeutic Interventions/Progress Updates:   Pt received supine in bed and agreeable to PT. Supine>sit transfer with supervision assist and only min cues for safety. Pt donned brace sitting EOB with increased time, but no assist from PT.  Pt performed WC mobility to rehab gym x 19ft with supervision assist to navigate doorways.   Gait training with RW x251ft. PT provided supervision assist with min cues for Ad management with floor transitions.   Standing therex:  anterior and lateral lunges to Bosu ball with BUE support in parallel bars. Cues for proper ROM, increased WB into bosu ball and improved posture to reduce back pain. .   Transported to room via Green and max assist.   Pt reports that he had trouble positioning urinal in bed last night and soiled his pants slightly. Standing balance to change pants and perform personal hygiene. supervisoin assist from PT with min cues to reduce trunk rotation and prevent increased pain.   Sit>supine with supervision assist from PT. Pt doffed brace in supine withouth assist from PT>        Therapy Documentation Precautions:  Precautions Precautions: Fall Required Braces or Orthoses: Spinal Brace Spinal Brace: Lumbar corset, Applied in sitting position Restrictions Weight Bearing Restrictions: Yes Vital  Signs: Therapy Vitals Temp: 97.9 F (36.6 C) Temp Source: Oral Pulse Rate: 75 Resp: 18 BP: 122/66 Patient Position (if appropriate): Lying Oxygen Therapy SpO2: 99 % O2 Device: Not Delivered Pain:   7/10 in low back. Dull.   See Function Navigator for Current Functional Status.   Therapy/Group: Individual Therapy  [name] 03/05/2016, 9:41 AM

## 2016-03-05 NOTE — Progress Notes (Signed)
Social Work Patient ID: Jonathan Holden, male   DOB: 1945/02/11, 71 y.o.   MRN: [id number]  Spoke with Mollie-pt's girlfriend to discuss discharge needs and the plan for Monday. She has been checked off on transfers and ambulating with Pt. Discussed [facility] contacting her to set up education for IV antibiotics, so she will be doing this. Have added home health therapies to this, until antibiotics finished. Will order equipment that is needed. MD contacted her but she  Missed the cal, she plans to call him back. She needs to verbalize her concerns and vent about their hospital stay. Will work on discharge Monday.

## 2016-03-05 NOTE — Progress Notes (Signed)
Speech Language Pathology Daily Session Note  Patient Details  Name: Jonathan Holden MRN: [id number] Date of Birth: 1945/11/20  Today's Date: 03/05/2016 SLP Individual Time: 1515-1600 SLP Individual Time Calculation (min): 45 min  Short Term Goals: Week 1: SLP Short Term Goal 1 (Week 1): Pt will itilize external aides for recall of basic, daily as well as new informaiton with Min A verbal cues. SLP Short Term Goal 2 (Week 1): Pt will demonstrate selective attention to task in moderately distracting environment for ~30 Minutes iwth Min A verbal cues for redirections.  SLP Short Term Goal 3 (Week 1): Pt will complete complex medication management tasks with Min A verbal cues.  SLP Short Term Goal 4 (Week 1): Pt will complete complex money management tasks with Min A verbal cues.  SLP Short Term Goal 5 (Week 1): Pt will demonstrate anticipatory awareness by stating 3 activites that pt can safety participate in within home setting.   Skilled Therapeutic Interventions: Skilled treatment session focused on cognition goals. SLP facilitated session by providing Min A for completion of semi-complex novel card game. SLP further facilitated session by providing Mod A verbal and tactile cues for safety when toileting. Pt with no insight into need for walker or locking brakes on wheelchair. Pt left in bed, bed alarm on and all needs within reach. Continue per current plan of care.      Function:    Cognition Comprehension Comprehension assist level: Understands basic 90% of the time/cues < 10% of the time  Expression   Expression assist level: Expresses complex 90% of the time/cues < 10% of the time  Social Interaction Social Interaction assist level: Interacts appropriately 75 - 89% of the time - Needs redirection for appropriate language or to initiate interaction.  Problem Solving Problem solving assist level: Solves basic 75 - 89% of the time/requires cueing 10 - 24% of the time  Memory Memory  assist level: Recognizes or recalls 75 - 89% of the time/requires cueing 10 - 24% of the time    Pain Pain Assessment Pain Score: 4   Therapy/Group: Individual Therapy  Cayenne Breault 03/05/2016, 4:20 PM

## 2016-03-05 NOTE — Progress Notes (Signed)
Physical Therapy Note  Patient Details  Name: [name] MRN: [id number] Date of Birth: August 22, 1945 Today's Date: 03/05/2016  1300-1330, 30 min individual tx Pain: " not too bad" ; LBP  Gait on level tile with RW; advanced gait training R><L and backwards, with VCs for technque and safety.  Pt used urinal sitting EOB.  Pt left resting in bed with alarm set and all needs within reach.  See function navigator for current status   Arriyah Madej 03/05/2016, 12:45 PM

## 2016-03-05 NOTE — Progress Notes (Addendum)
[facility] PHYSICAL MEDICINE & REHABILITATION     PROGRESS NOTE  Subjective/Complaints:  Up in chair. No new complaints at present. Girlfriend with anxiety over situation  ROS: pt denies nausea, vomiting, diarrhea, cough, shortness of breath or chest pain      Objective: Vital Signs: Blood pressure 122/66, pulse 75, temperature 97.9 F (36.6 C), temperature source Oral, resp. rate 18, weight 91.9 kg (202 lb 11.2 oz), SpO2 99 %. No results found.  Recent Labs  03/02/16 1128  WBC 9.7  HGB 11.7*  HCT 35.4*  PLT 422*    Recent Labs  03/02/16 1128  NA 132*  K 4.6  CL 100*  GLUCOSE 135*  BUN 12  CREATININE 0.90  CALCIUM 8.9   CBG (last 3)  No results for input(s): GLUCAP in the last 72 hours.  Wt Readings from Last 3 Encounters:  03/03/16 91.9 kg (202 lb 11.2 oz)  02/28/16 96.5 kg (212 lb 11.9 oz)  02/17/16 95.3 kg (210 lb)    Physical Exam:  BP 122/66 (BP Location: Left Arm)   Pulse 75   Temp 97.9 F (36.6 C) (Oral)   Resp 18   Wt 91.9 kg (202 lb 11.2 oz)   SpO2 99%   BMI 26.74 kg/m  Constitutional: He appears well-developed. No distress.  HENT: Normocephalicand atraumatic. Cold sores around upper lip Eyes: EOMI. No discharge.  Cardiovascular: IRR/IRR Respiratory: Effort normaland breath sounds normal.  GI: Soft. Bowel sounds are normal.  Musculoskeletal:  Low back TTP Skin: Skin is warm. He is not diaphoretic.  Psychiatric:  Cooperative. STM deficits with some improvement. Needs cues still Neurological: He is alert and oriented.  Motor: 5/5 bilateral deltoid, biceps, tricesp, wrist, hand intrinsics 4/5 bilateral hip flexors, knee extensors, ankle dorsiflexor --no change Sensation intact to light touch bilateral lower limbs to light touch. Motor and sensory exam stable.   Assessment/Plan: 1. Functional deficits secondary to discitis and ecephalopathy which require 3+ hours per day of interdisciplinary therapy in a comprehensive inpatient rehab  setting. Physiatrist is providing close team supervision and 24 hour management of active medical problems listed below. Physiatrist and rehab team continue to assess barriers to discharge/monitor patient progress toward functional and medical goals.  Function:  Bathing Bathing position   Position: Shower  Bathing parts Body parts bathed by patient: Right arm, Left arm, Chest, Abdomen, Buttocks, Front perineal area, Right upper leg, Left upper leg, Right lower leg, Left lower leg Body parts bathed by helper: Back  Bathing assist Assist Level: Supervision or verbal cues      Upper Body Dressing/Undressing Upper body dressing   What is the patient wearing?: Pull over shirt/dress     Pull over shirt/dress - Perfomed by patient: Thread/unthread right sleeve, Thread/unthread left sleeve, Put head through opening, Pull shirt over trunk       Orthosis activity level: Performed by patient  Upper body assist Assist Level: Supervision or verbal cues   Set up : To obtain clothing/put away, To apply TLSO, cervical collar  Lower Body Dressing/Undressing Lower body dressing   What is the patient wearing?: Pants, Non-skid slipper socks Underwear - Performed by patient: Thread/unthread left underwear leg, Pull underwear up/down Underwear - Performed by helper: Thread/unthread right underwear leg Pants- Performed by patient: Thread/unthread right pants leg, Thread/unthread left pants leg, Pull pants up/down, Fasten/unfasten pants   Non-skid slipper socks- Performed by patient: Don/doff right sock, Don/doff left sock Non-skid slipper socks- Performed by helper: Don/doff right sock, Don/doff left sock  Socks - Performed by patient: Don/doff right sock, Don/doff left sock                Lower body assist Assist for lower body dressing: Touching or steadying assistance (Pt > 75%)      Toileting Toileting Toileting activity did not occur: No continent bowel/bladder event Toileting steps  completed by patient: Performs perineal hygiene Toileting steps completed by helper: Adjust clothing prior to toileting, Adjust clothing after toileting Toileting Assistive Devices: Grab bar or rail  Toileting assist Assist level: Supervision or verbal cues, Touching or steadying assistance (Pt.75%)   Transfers Chair/bed transfer   Chair/bed transfer method: Stand pivot Chair/bed transfer assist level: Touching or steadying assistance (Pt > 75%) Chair/bed transfer assistive device: Medical sales representative     Max distance: 90 Assist level: Touching or steadying assistance (Pt > 75%)   Wheelchair   Type: Manual Max wheelchair distance: 146ft  Assist Level: Touching or steadying assistance (Pt > 75%)  Cognition Comprehension Comprehension assist level: Understands basic 90% of the time/cues < 10% of the time  Expression Expression assist level: Expresses basic 90% of the time/requires cueing < 10% of the time.  Social Interaction Social Interaction assist level: Interacts appropriately 75 - 89% of the time - Needs redirection for appropriate language or to initiate interaction.  Problem Solving Problem solving assist level: Solves basic 75 - 89% of the time/requires cueing 10 - 24% of the time  Memory Memory assist level: Recognizes or recalls 50 - 74% of the time/requires cueing 25 - 49% of the time    Medical Problem List and Plan: 1. 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 Lumbar corset applied in sitting position  Cont CIR therapies. Continued education for patient/caregivers   2. DVT Prophylaxis/Anticoagulation: Eliquis 3. Pain Management:Lidoderm patch. Oxycodone as needed  -adding scheduled 5mg  oxy IR to coincide with therapy activity 4. Mood: Provide emotional support 5. Neuropsych: This patient is somewhat capable of making decisions on hisown behalf. 6. Skin/Wound Care: Routine skin  checks 7. Fluids/Electrolytes/Nutrition:    Follow BMP 8.Infectious disease: plan is for6 weeks penicillin G per infectious disease  TEE without vegetation.   -zovirax cream for cold sores added 9.New-onset atrial fibrillation as well as history of CAD with stenting. Cardizem 90 mg every 6 hours, Lopressor 125 mg twice a day. Cardiac rate control. Follow-up cardiology services -monitor for exercise tolerance with PT, check VS as needed 10.Hypertension. Lisinopril 20 mg daily and monitor with increased mobility  BP's too low  -will hold lisinopril and monitor 11.Hyperlipidemia. Lipitor 12.Constipation. Laxative assistance 13. ABLA  Hb 12.2 on 2/7   Cont to monitor 14. Hypoalbuminemia  Supplement initiated 2/10 15. GERD  Protonix changed to BID    LOS (Days) 6 A FACE TO FACE EVALUATION WAS PERFORMED  SWARTZ,ZACHARY T 03/05/2016 10:10 AM

## 2016-03-05 NOTE — Progress Notes (Signed)
Social Work Patient ID: [name], male   DOB: April 14, 1945, 71 y.o.   MRN: [id number]  Spoke with mollie-pt's girlfriend to discuss team conference and discharge plan. She seems very overwhelmed with all of this and Wanted to complain about all of the issues from the ER to here and how can a MD be assigned when not here-ie Dr Raliegh Ip. Informed her will have MD or PA contact her regarding her questions. Discussed hiring assist if she felt  She could not provide 24 hr supervision or alternative NHP. She has the number to [employer] and have contacted them without a call back. Encouraged her to come in and attend therapies with pt since she has not done this Yet.

## 2016-03-06 ENCOUNTER — Inpatient Hospital Stay (HOSPITAL_COMMUNITY): Payer: Medicare Other | Admitting: Physical Therapy

## 2016-03-06 ENCOUNTER — Inpatient Hospital Stay (HOSPITAL_COMMUNITY): Payer: Medicare Other | Admitting: Speech Pathology

## 2016-03-06 ENCOUNTER — Telehealth: Payer: Self-pay | Admitting: Interventional Cardiology

## 2016-03-06 ENCOUNTER — Inpatient Hospital Stay (HOSPITAL_COMMUNITY): Payer: Medicare Other | Admitting: Occupational Therapy

## 2016-03-06 LAB — CBC
HEMATOCRIT: 35.1 % — AB (ref 39.0–52.0)
Hemoglobin: 11.7 g/dL — ABNORMAL LOW (ref 13.0–17.0)
MCH: 31.5 pg (ref 26.0–34.0)
MCHC: 33.3 g/dL (ref 30.0–36.0)
MCV: 94.6 fL (ref 78.0–100.0)
PLATELETS: 382 10*3/uL (ref 150–400)
RBC: 3.71 MIL/uL — AB (ref 4.22–5.81)
RDW: 13.9 % (ref 11.5–15.5)
WBC: 8.6 10*3/uL (ref 4.0–10.5)

## 2016-03-06 LAB — SEDIMENTATION RATE: Sed Rate: 110 mm/hr — ABNORMAL HIGH (ref 0–16)

## 2016-03-06 LAB — BASIC METABOLIC PANEL
Anion gap: 10 (ref 5–15)
BUN: 11 mg/dL (ref 6–20)
CHLORIDE: 96 mmol/L — AB (ref 101–111)
CO2: 25 mmol/L (ref 22–32)
CREATININE: 0.95 mg/dL (ref 0.61–1.24)
Calcium: 9.5 mg/dL (ref 8.9–10.3)
Glucose, Bld: 124 mg/dL — ABNORMAL HIGH (ref 65–99)
Potassium: 4.6 mmol/L (ref 3.5–5.1)
SODIUM: 131 mmol/L — AB (ref 135–145)

## 2016-03-06 LAB — C-REACTIVE PROTEIN: CRP: 10.1 mg/dL — AB (ref <1.0)

## 2016-03-06 MED ORDER — GUAIFENESIN ER 600 MG PO TB12
600.0000 mg | ORAL_TABLET | Freq: Two times a day (BID) | ORAL | Status: DC
  Administered 2016-03-06 – 2016-03-09 (×6): 600 mg via ORAL
  Filled 2016-03-06 (×7): qty 1

## 2016-03-06 NOTE — Progress Notes (Signed)
Social Work Elease Hashimoto, LCSW Social Worker Signed   Patient Care Conference Date of Service: 03/04/2016  3:34 PM      Hide copied text Hover for attribution information Inpatient RehabilitationTeam Conference and Plan of Care Update Date: 03/04/2016   Time: 10:55 AM      Patient Name: Jonathan Holden      Medical Record Number: [id number]  Date of Birth: Nov 14, 1945 Sex: Male         Room/Bed: 4W18C/4W18C-01 Payor Info: Payor: MEDICARE / Plan: MEDICARE PART A AND B / Product Type: *No Product type* /     Admitting Diagnosis: Discitis  Admit Date/Time:  02/28/2016  6:29 PM Admission Comments: No comment available    Primary Diagnosis:  <principal problem not specified> Principal Problem: <principal problem not specified>       Patient Active Problem List    Diagnosis Date Noted  . Gastroesophageal reflux disease    . Acute blood loss anemia    . Hypoalbuminemia due to protein-calorie malnutrition ([place])    . Benign essential HTN    . New onset a-fib ([place])    . Bacteremia    . Pulmonary hypertension    . Low back pain    . Ileus ([place])    . Endocarditis of native valve    . Infection of intervertebral disc ([place])    . Streptococcal bacteremia    . Lumbar discitis    . Sepsis ([place]) 02/21/2016  . Hyponatremia 02/21/2016  . Atrial fibrillation with RVR ([place]) 02/21/2016  . Back pain 02/21/2016  . Acute encephalopathy 02/21/2016  . CAD (coronary artery disease), native coronary artery 09/19/2014  . Essential hypertension 09/19/2014  . Hyperlipidemia 10/03/2013      Expected Discharge Date: Expected Discharge Date: 03/09/16   Team Members Present: Physician leading conference: Dr. Delice Lesch Social Worker Present: Ovidio Kin, LCSW Nurse Present: Heather Roberts, RN PT Present: Barrie Folk, PT;Caroline Lacinda Axon, Dustin Folks, PT OT Present: Clyda Greener, OT SLP Present: Gunnar Fusi, SLP PPS Coordinator present : Daiva Nakayama, RN, CRRN       Current Status/Progress  Goal Weekly Team Focus  Medical     lumbar diskitis, encephalopathy, on pen G for 6 weeks. improving pain and cognitive status, in LSO  improve functional mobility, pain  ID mgt, continued pain mgt, memory and cognition   Bowel/Bladder     Contintnent of bowel and bladder. LBm 02/29/16. Request use of condom cath at [facility] bowel and bladder  Monitor    Swallow/Nutrition/ Hydration               ADL's     Min guard to supervision for bathing and dressing sit to stand.  Decreased endurance, decreased selective attention.    supervision overall  selfcare retraining, balance retraining, transfer training, pt/family education, cognitive retraining   Mobility     Supervision with occasional min assist due to pain and fatigue for transfers and gait up to 227ft with RW. Supervision assist for bed mobility.   Supervision assist overall with RW.   balance, endurance, pain management, LE strengthening, safety with transfers and gait using RW.    Communication               Safety/Cognition/ Behavioral Observations   Mod A to Min A  Mod I  complex problem solving, aternating attention, recall of complex information   Pain     No c/o pain  <3  Monitor for nonverbal cues of  pain   Skin     Rash to lower back and R foot  No additional skin breakdown  Assess skin q shift     *See Care Plan and progress notes for long and short-term goals.   Barriers to Discharge: back precautions, safety awareness     Possible Resolutions to Barriers:  education re: LSO, ongoing cognitive remediation     Discharge Planning/Teaching Needs:  Home with significant other who is here daily and works from home so can provide 24 hr supervision at discharge.      Team Discussion:  Goals of supervision level. Decreased endurance, balance and selective attention. At times needs cues. MD-BP controlled and changed protonix to BID. Fluids at night wants condom cath on then. Back pain-brace for comfort. IV antibiotics  for 6 weeks. Pain is managed with meds.  Revisions to Treatment Plan:  DC 2/19    Continued Need for Acute Rehabilitation Level of Care: The patient requires daily medical management by a physician with specialized training in physical medicine and rehabilitation for the following conditions: Daily direction of a multidisciplinary physical rehabilitation program to ensure safe treatment while eliciting the highest outcome that is of practical value to the patient.: Yes Daily medical management of patient stability for increased activity during participation in an intensive rehabilitation regime.: Yes Daily analysis of laboratory values and/or radiology reports with any subsequent need for medication adjustment of medical intervention for : Wound care problems;Post surgical problems   Elease Hashimoto 03/06/2016, 9:02 AM       Patient ID: Jonathan Holden, male   DOB: March 13, 1945, 71 y.o.   MRN: [id number]

## 2016-03-06 NOTE — Telephone Encounter (Signed)
Follow Up:    Mollie calling again,she said you would know if it was time for his injection.She said they had lost track since pt was in the hospital,he did have one while he was in the hospital.

## 2016-03-06 NOTE — Progress Notes (Signed)
[place] PHYSICAL MEDICINE & REHABILITATION     PROGRESS NOTE  Subjective/Complaints:  Sitting in bed. No new complaints. Back sore but tolerable at rest. Does limit therapy tolerance. Girlfriend present with numerous questions  ROS: pt denies nausea, vomiting, diarrhea, cough, shortness of breath or chest pain   Objective: Vital Signs: Blood pressure (!) 99/58, pulse 70, temperature 97.5 F (36.4 C), temperature source Oral, resp. rate 16, weight 91.9 kg (202 lb 11.2 oz), SpO2 95 %. No results found.  Recent Labs  03/06/16 0554  WBC 8.6  HGB 11.7*  HCT 35.1*  PLT 382    Recent Labs  03/06/16 0554  NA 131*  K 4.6  CL 96*  GLUCOSE 124*  BUN 11  CREATININE 0.95  CALCIUM 9.5   CBG (last 3)  No results for input(s): GLUCAP in the last 72 hours.  Wt Readings from Last 3 Encounters:  03/03/16 91.9 kg (202 lb 11.2 oz)  02/28/16 96.5 kg (212 lb 11.9 oz)  02/17/16 95.3 kg (210 lb)    Physical Exam:  BP (!) 99/58 (BP Location: Left Arm)   Pulse 70   Temp 97.5 F (36.4 C) (Oral)   Resp 16   Wt 91.9 kg (202 lb 11.2 oz)   SpO2 95%   BMI 26.74 kg/m  Constitutional: He appears well-developed. No distress.  HENT: Normocephalicand atraumatic. Cold sores around upper lip Eyes: EOMI. No discharge.  Cardiovascular: IRR/IRR Respiratory: Effort normaland breath sounds normal.  GI: Soft. Bowel sounds are normal.  Musculoskeletal:  Low back remains TTP Skin: Skin is warm. He is not diaphoretic.  Psychiatric:  Cooperative. STM deficits with improvement. Better insight and awareness Neurological: He is alert and oriented.  Motor: 5/5 bilateral deltoid, biceps, tricesp, wrist, hand intrinsics 4/5 bilateral hip flexors, knee extensors, ankle dorsiflexor --no change Sensation intact to light touch bilateral lower limbs to light touch. Motor and sensory exam stable.   Assessment/Plan: 1. 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 Physiatrist is providing close team supervision and 24 hour management of active medical problems listed below. Physiatrist and rehab team continue to assess barriers to discharge/monitor patient progress toward functional and medical goals.  Function:  Bathing Bathing position   Position: Shower  Bathing parts Body parts bathed by patient: Right arm, Left arm, Chest, Abdomen, Buttocks, Front perineal area, Right upper leg, Left upper leg, Right lower leg, Left lower leg Body parts bathed by helper: Back  Bathing assist Assist Level: Supervision or verbal cues      Upper Body Dressing/Undressing Upper body dressing   What is the patient wearing?: Pull over shirt/dress     Pull over shirt/dress - Perfomed by patient: Thread/unthread right sleeve, Thread/unthread left sleeve, Put head through opening, Pull shirt over trunk       Orthosis activity level: Performed by patient  Upper body assist Assist Level: Supervision or verbal cues   Set up : To obtain clothing/put away, To apply TLSO, cervical collar  Lower Body Dressing/Undressing Lower body dressing   What is the patient wearing?: Pants, Non-skid slipper socks Underwear - Performed by patient: Thread/unthread left underwear leg, Pull underwear up/down Underwear - Performed by helper: Thread/unthread right underwear leg Pants- Performed by patient: Thread/unthread right pants leg, Thread/unthread left pants leg, Pull pants up/down, Fasten/unfasten pants   Non-skid slipper socks- Performed by patient: Don/doff right sock, Don/doff left sock Non-skid slipper socks- Performed  by helper: Don/doff right sock, Don/doff left sock Socks - Performed by patient: Don/doff right sock, Don/doff left sock                Lower body assist Assist for lower body dressing: Touching or steadying assistance (Pt > 75%)      Toileting Toileting Toileting  activity did not occur: No continent bowel/bladder event Toileting steps completed by patient: Performs perineal hygiene, Adjust clothing prior to toileting, Adjust clothing after toileting Toileting steps completed by helper: Adjust clothing prior to toileting, Adjust clothing after toileting Toileting Assistive Devices: Grab bar or rail  Toileting assist Assist level: Supervision or verbal cues   Transfers Chair/bed transfer   Chair/bed transfer method: Stand pivot Chair/bed transfer assist level: Supervision or verbal cues Chair/bed transfer assistive device: Medical sales representative     Max distance: 249f  Assist level: Supervision or verbal cues   Wheelchair   Type: Manual Max wheelchair distance: 16105f Assist Level: Supervision or verbal cues  Cognition Comprehension Comprehension assist level: Understands basic 90% of the time/cues < 10% of the time  Expression Expression assist level: Expresses complex 90% of the time/cues < 10% of the time  Social Interaction Social Interaction assist level: Interacts appropriately 75 - 89% of the time - Needs redirection for appropriate language or to initiate interaction.  Problem Solving Problem solving assist level: Solves basic 75 - 89% of the time/requires cueing 10 - 24% of the time  Memory Memory assist level: Recognizes or recalls 75 - 89% of the time/requires cueing 10 - 24% of the time    Medical Problem List and Plan: 1. 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 Lumbar corset applied in sitting position  Cont CIR therapies. Continued education for patient/caregivers   2. DVT Prophylaxis/Anticoagulation: Eliquis 3. Pain Management:Lidoderm patch. Oxycodone as needed  -adding scheduled 62m36mxy IR to coincide with therapy activity 4. Mood: Provide emotional support 5. Neuropsych: This patient is somewhat capable of making decisions on hisown  behalf. 6. Skin/Wound Care: Routine skin checks 7. Fluids/Electrolytes/Nutrition:    Follow BMP 8.Infectious disease: plan is for6 weeks penicillin G per infectious disease  TEE without vegetation.   -CRP down to 10.1 today, ESR up to 110, WBC's sl decreased to 9.5 9.New-onset atrial fibrillation as well as history of CAD with stenting. Cardizem 90 mg every 6 hours, Lopressor 125 mg twice a day(decrease to 100m34m below). Cardiac rate control.   -monitor for exercise tolerance with PT, check VS as needed 10.Hypertension. Lisinopril 20 mg daily and monitor with increased mobility  BP's a little better today.  -continue to hold lisinopril   -decrease lopressor to 100mg40m 11.Hyperlipidemia. Lipitor 12.Constipation. Laxative assistance 13. ABLA  Hb 12.2 on 2/7   Cont to monitor 14. Hypoalbuminemia  Supplement initiated 2/10 15. GERD  Protonix changed to BID   Spent an extensive amount of time discussing plan/treatment/education. Greater than 50% of time during this encounter was spent counseling patient/family in regard to these issues.   LOS (Days) 7 A FACE TO FACE EVALUATION WAS PERFORMED  Kymberlyn Eckford T 03/06/2016 10:09 AM

## 2016-03-06 NOTE — Progress Notes (Signed)
Speech Language Pathology Discharge Summary  Patient Details  Name: Jonathan Holden MRN: [id number] Date of Birth: 1945-04-19  Today's Date: 03/06/2016 SLP Individual Time: 1400-1430 SLP Individual Time Calculation (min): 30 min   Skilled Therapeutic Interventions:  Skilled treatment session focused on cognition goals. SLP facilitated session by providing Min A verbal cues for realization that pt is often distracted from requested tasks d/t internal distractions such as rationalizing problem. Pt receptive to information but no retention d/t personality differences. Pt was returned to room, assisted into bed and all needs left within reach.      Patient has met 1 of 4 long term goals.  Patient to discharge at overall Supervision level.  Reasons goals not met: Personality based, pt often internally distracted by attempting to rationalize all information, instead of performing requested task   Clinical Impression/Discharge Summary:  Pt is discharging at supervision level in large part to personality differences that cause him to internally analyze all information and detract from purpose of cognition activity. Pt's girlfriend is able to provide supervision and no HHST is indicated.    Care Partner:  Caregiver Able to Provide Assistance: Yes  Type of Caregiver Assistance: Cognitive  Recommendation:  None      Equipment: None   Reasons for discharge: Treatment goals met (Adequate for discharge)   Patient/Family Agrees with Progress Made and Goals Achieved: Yes   Function:    Cognition Comprehension Comprehension assist level: Follows complex conversation/direction with extra time/assistive device  Expression   Expression assist level: Expresses complex 90% of the time/cues < 10% of the time  Social Interaction Social Interaction assist level: Interacts appropriately 90% of the time - Needs monitoring or encouragement for participation or interaction.  Problem Solving Problem  solving assist level: Solves basic problems with no assist  Memory Memory assist level: More than reasonable amount of time    Aaminah Forrester B. Rutherford Nail, M.S., [place] 03/06/2016, 4:02 PM

## 2016-03-06 NOTE — Progress Notes (Signed)
Patient and nurse concern in regards to some low BP readings with cardiziem ordered Qh6. Patient has refused 0000 and 0600 medication with nurse. Will inform day shift nurse and MD of concerns.

## 2016-03-06 NOTE — Progress Notes (Signed)
Pt arrived to unit in bed, no transfer at this time, pt alert to self and situation.  Pt skin is blanchable heels and buttocks, suture to right lateral ABD.  Pt lungs are diminshed in all lung fields, BS present x 4, Powerport PICC to RUE.  Back brace on at this time. Pt refused meal

## 2016-03-06 NOTE — Telephone Encounter (Signed)
Mollie( Friend) is calling to pick up the Praluent ( Cholesterol inejctions ) . Please call

## 2016-03-06 NOTE — Progress Notes (Addendum)
Social Work Patient ID: Jonathan Holden, male   DOB: May 30, 1945, 71 y.o.   MRN: [id number]  Pt has decided to get the rolling walker, along with the wheelchair. He is aware he will need to privately pay for the Rolling walker. Mollie-girlfriend to meet with [facility] to be educated about IV antibiotics at home. Have made referral to [facility] for follow up therapies. Working toward his discharge on Monday.  Went over all of the plans with pt and how it will work at home. At times pt needs to be pushed to do more and not just give up in therapy session.

## 2016-03-06 NOTE — Progress Notes (Signed)
Physical Therapy Session Note  Patient Details  Name: Jonathan Holden MRN: [id number] Date of Birth: [birth year]  Today's Date: 03/06/2016 PT Individual Time: 6834-1962 AND 1301-1400 PT Individual Time Calculation (min): 44 min   Short Term Goals: Week 1:  PT Short Term Goal 1 (Week 1): Pt will be able to perform bed mobility with supervision PT Short Term Goal 2 (Week 1): Pt will be able to perform basic transfers with min assist and LRAD PT Short Term Goal 3 (Week 1): Pt will be able to gait x 150' with min assist with LRAD PT Short Term Goal 4 (Week 1): Pt will be able to complete 12 steps for strengthening with rails with min assist Week 2:     Skilled Therapeutic Interventions/Progress Updates:   Pt received supine in bed and agreeable to PT. Supine>sit transfer with supervision assist for safety. Pt able to don brace with supervision assist from PT sitting EOB. [name] and girlfriend present for education  Gait to rehab gym x120f with RW with superiviosn assist from PT. PT educated pt's significant other in proper positioning for guarding and cues for AD safety.   Curb training with RW and supervision assist completed x 2 with PT and x 2 with assist from SO. Cues from PT to SO for positioning , and how to cue patient for AD management.   Car transfer training completed x 2 with supervision assist, once from PT, Once from SO. Cues to patient for proper hand placement and for proper use of AD to not leave walker mid turn.    Patient returned to room and left sitting on toilet following Toilet transfer with supervision assist provided  By SO. Cues for IV pole management and safety with. SO checked off for gait in room and transfers, Pt left on toilet with SO to perform transfer back to [place]       Session 2.  Pt received supine in bed and agreeable to PT. Supine>sit transfer with supervision assist and only slight cues for safety.    [facility]   Gait on unlevel cement surface x 1563fwith supervision assist and min cues for posture and improved step height to prevent foot drag.   Standing therex  SLS with finger tip UE support, Tandem stance without UE support, 3 way hip AROM x 15 BLE in each direction.    Gait in hall x 17057fck to room from rehab gym with distant supervision assist from PT and cues for direction in familiar environment.   .Patient returned to room and left sitting in WC [facility] call bell in reach and all needs met.         Therapy Documentation Precautions:  Precautions Precautions: Fall Required Braces or Orthoses: Spinal Brace Spinal Brace: Lumbar corset, Applied in sitting position Restrictions Weight Bearing Restrictions: No General:   Vital Signs:  Pain: Pain Assessment Pain Assessment: 0-10 Pain Score: 7  Pain Type: Chronic pain Pain Location: Back Pain Orientation: Lower Pain Descriptors / Indicators: Aching Pain Frequency: Intermittent Pain Onset: On-going Pain Intervention(s): Medication (See eMAR)   See Function Navigator for Current Functional Status.   Therapy/Group: Individual Therapy  AusLorie Phenix16/2018, 11:27 AM

## 2016-03-06 NOTE — Progress Notes (Signed)
Occupational Therapy Session Note  Patient Details  Name: Jonathan Holden MRN: [id number] Date of Birth: 1945/08/06  Today's Date: 03/06/2016 OT Individual Time: 1000-1045 OT Individual Time Calculation (min): 45 min    Short Term Goals: Week 1:  OT Short Term Goal 1 (Week 1): Pt will ambulate and gather all needed clothing items in prep for bathing/dressing task with supervision OT Short Term Goal 2 (Week 1): Pt will don LSO mod I for increased independence with ADLs OT Short Term Goal 3 (Week 1): Pt will complete 2 grooming tasks in standing with distant supervision OT Short Term Goal 4 (Week 1): Pt will attend to functional task for 3 minutes with no more than 1 VC for redirection  Skilled Therapeutic Interventions/Progress Updates:   Pt completed shower and dressing during session.  Discussed expectations of session with pt and significant other to start session.  IV team member present to disconnect IV for pt to shower.  Pt needed min questioning cueing to organize what he was going to do and to problem solve through it.  He completed shower with overall supervision but did not attempt washing his lower legs.  Completed dressing sitting on the BSC to donn pants and then out to the EOB to [employer] shirt.  Pt with decreased anticipatory awareness.  When asked about deodorant he stated that he put some on yesterday and it would last 48 hrs.  He was not able to rationalize that it would not last if he had just washed it off in the shower.  Pt then proceeded to lay down in the bed secondary to increased back pain.  Pain at a 7/10 level with nursing made aware and meds brought.  Pt declined doing any further therapy secondary to pain at this time and missed the last 15 mins of session.     Therapy Documentation Precautions:  Precautions Precautions: Fall Required Braces or Orthoses: Spinal Brace (comfort only) Spinal Brace: Lumbar corset, Applied in sitting position Restrictions Weight  Bearing Restrictions: No General: General OT Amount of Missed Time: 15 Minutes  Pain: Pain Assessment Pain Assessment: No/denies pain Pain Score: 7  Pain Type: Chronic pain Pain Location: Back Pain Orientation: Lower Pain Descriptors / Indicators: Aching Pain Frequency: Intermittent Pain Onset: On-going Pain Intervention(s): Medication (See eMAR) ADL: See Function Navigator for Current Functional Status.   Therapy/Group: Individual Therapy  Kambre Messner OTR/L 03/06/2016, 12:42 PM

## 2016-03-07 ENCOUNTER — Ambulatory Visit (HOSPITAL_COMMUNITY): Payer: Medicare Other | Admitting: Physical Therapy

## 2016-03-07 ENCOUNTER — Inpatient Hospital Stay (HOSPITAL_COMMUNITY): Payer: Medicare Other | Admitting: Occupational Therapy

## 2016-03-07 MED ORDER — FLEET ENEMA 7-19 GM/118ML RE ENEM
1.0000 | ENEMA | Freq: Every day | RECTAL | Status: DC | PRN
  Administered 2016-03-07: 1 via RECTAL
  Filled 2016-03-07: qty 1

## 2016-03-07 MED ORDER — BISACODYL 10 MG RE SUPP
10.0000 mg | Freq: Every day | RECTAL | Status: DC | PRN
  Administered 2016-03-07: 10 mg via RECTAL
  Filled 2016-03-07: qty 1

## 2016-03-07 NOTE — Progress Notes (Signed)
Physical Therapy Discharge Summary  Patient Details  Name: Jonathan Holden MRN: [id number] Date of Birth: 03-13-1945   Patient has met 85 of 74 long term goals due to improved activity tolerance, improved balance, improved postural control, increased strength, decreased pain, ability to compensate for deficits, functional use of  right lower extremity and left lower extremity, improved attention, improved awareness and improved coordination.  Patient to discharge at an ambulatory level Supervision.   Patient's care partner is independent to provide the necessary physical and cognitive assistance at discharge.  Reasons goals not met: All goals met  Recommendation:  Patient will benefit from ongoing skilled PT services in home health setting to continue to advance safe functional mobility, address ongoing impairments in Balance, endurance, safety with gait and transfers, awareness and minimize fall risk.  Equipment: Conservation officer, nature, Wheelchair  Reasons for discharge: treatment goals met and discharge from hospital  Patient/family agrees with progress made and goals achieved: Yes  PT Discharge Precautions/Restrictions Precautions Precautions: Fall Required Braces or Orthoses: Spinal Brace (comfort only) Spinal Brace: Lumbar corset;Applied in sitting position Pain Pain Assessment Pain Assessment: 0-10 Pain Score: 5  Pain Type: Chronic pain Pain Location: Back Pain Orientation: Right;Lower Pain Descriptors / Indicators: Aching;Discomfort;Sore Pain Onset: With Activity Pain Intervention(s): Repositioned;Ambulation/increased activity Multiple Pain Sites: No Vision/Perception    WFL Cognition Orientation Level: Oriented X4 Selective Attention: Impaired Memory: Impaired Memory Impairment: Decreased recall of new information Awareness: Impaired Problem Solving: Impaired Safety/Judgment: Impaired Comments: interally distracted.  Sensation Sensation Light Touch: Appears  Intact Stereognosis: Appears Intact Hot/Cold: Appears Intact Proprioception: Appears Intact Coordination Gross Motor Movements are Fluid and Coordinated: Yes Fine Motor Movements are Fluid and Coordinated: Yes Motor  Motor Motor: Within Functional Limits Motor - Discharge Observations: generalized weakness  Mobility Bed Mobility Bed Mobility: Rolling Right;Rolling Left;Supine to Sit;Sit to Supine Rolling Right: 6: Modified independent (Device/Increase time) Rolling Left: 6: Modified independent (Device/Increase time) Supine to Sit: 6: Modified independent (Device/Increase time) Sit to Supine: 6: Modified independent (Device/Increase time) Transfers Transfers: Yes Sit to Stand: 5: Supervision Sit to Stand Details: Verbal cues for technique;Verbal cues for precautions/safety Stand Pivot Transfers: 5: Supervision ( with RW ) Stand Pivot Transfer Details: Verbal cues for technique;Verbal cues for precautions/safety Locomotion  Ambulation Ambulation: Yes Ambulation/Gait Assistance: 5: Supervision Ambulation/Gait Assistance Details: Verbal cues for precautions/safety;Verbal cues for gait pattern Gait Gait: Yes Gait Pattern: Impaired Gait Pattern: Step-through pattern;Decreased step length - right;Decreased step length - left Gait velocity: 1.875 ft/sec Stairs / Additional Locomotion Stairs: Yes Stairs Assistance: 5: Supervision Stairs Assistance Details: Verbal cues for precautions/safety;Verbal cues for gait pattern Stair Management Technique: Two rails Height of Stairs: 6 Wheelchair Mobility Wheelchair Mobility: Yes Wheelchair Assistance: 5: Investment banker, operational Details: Verbal cues for Information systems manager: Both lower extermities Wheelchair Parts Management: Supervision/cueing  Trunk/Postural Assessment  Cervical Assessment Cervical Assessment: Within Functional Limits Lumbar Assessment Lumbar Assessment: Exceptions to [facility] Lumbar  Strength Overall Lumbar Strength Comments: Pt with decreased lumbar strength and endurance secondary to back pain and herniated L3-L4 disk.   Balance Balance Balance Assessed: Yes Standardized Balance Assessment Standardized Balance Assessment: Timed Up and Go Test Timed Up and Go Test TUG: Normal TUG;Cognitive TUG Normal TUG (seconds): 19 Cognitive TUG (seconds): 26 Static Sitting Balance Static Sitting - Balance Support: Feet supported Static Sitting - Level of Assistance: 6: Modified independent (Device/Increase time) Dynamic Sitting Balance Dynamic Sitting - Balance Support: Feet supported;During functional activity Dynamic Sitting - Level of Assistance: 6: Modified independent (Device/Increase time)  Static Standing Balance Static Standing - Balance Support: During functional activity Static Standing - Level of Assistance: 6: Modified independent (Device/Increase time) Dynamic Standing Balance Dynamic Standing - Balance Support: Bilateral upper extremity supported Dynamic Standing - Level of Assistance: 5: Stand by assistance Extremity Assessment  RUE Assessment RUE Assessment: Within Functional Limits LUE Assessment LUE Assessment: Within Functional Limits RLE Assessment RLE Assessment: Within Functional Limits LLE Assessment LLE Assessment: Within Functional Limits   See Function Navigator for Current Functional Status.  Jonathan Holden 03/07/2016, 6:20 PM

## 2016-03-07 NOTE — Progress Notes (Signed)
[facility]     PROGRESS NOTE  Subjective/Complaints:  Lying in bed. Still with occasional cough. Feels he's progressing with therapies  ROS: pt denies nausea, vomiting, diarrhea, cough, shortness of breath or chest pain    Objective: Vital Signs: Blood pressure 105/65, pulse 75, temperature 98.9 F (37.2 C), temperature source Oral, resp. rate 18, weight 91.9 kg (202 lb 11.2 oz), SpO2 98 %. No results found.  Recent Labs  03/06/16 0554  WBC 8.6  HGB 11.7*  HCT 35.1*  PLT 382    Recent Labs  03/06/16 0554  NA 131*  K 4.6  CL 96*  GLUCOSE 124*  BUN 11  CREATININE 0.95  CALCIUM 9.5   CBG (last 3)  No results for input(s): GLUCAP in the last 72 hours.  Wt Readings from Last 3 Encounters:  03/03/16 91.9 kg (202 lb 11.2 oz)  02/28/16 96.5 kg (212 lb 11.9 oz)  02/17/16 95.3 kg (210 lb)    Physical Exam:  BP 105/65   Pulse 75   Temp 98.9 F (37.2 C) (Oral)   Resp 18   Wt 91.9 kg (202 lb 11.2 oz)   SpO2 98%   BMI 26.74 kg/m  Constitutional: He appears well-developed. No distress.  HENT: Normocephalicand atraumatic. Cold sores around upper lip Eyes: EOMI. No discharge.  Cardiovascular: IRR IRR Respiratory: Effort normaland breath sounds normal. Intermittent productive cough GI: Soft. Bowel sounds are normal.  Musculoskeletal:  Low back is TTP Skin: Skin is warm. He is not diaphoretic.  Psychiatric:  Cooperative. STM deficits with improvement. Better insight and awareness Neurological: He is alert and oriented.  Motor: 5/5 bilateral deltoid, biceps, tricesp, wrist, hand intrinsics 4/5 bilateral hip flexors, knee extensors, ankle dorsiflexor --no change Sensation intact to light touch bilateral lower limbs to light touch. Motor and sensory exam stable.   Assessment/Plan: 1. 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 Physiatrist is providing close team supervision and 24 hour management of active medical problems listed below. Physiatrist and rehab team continue to assess barriers to discharge/monitor patient progress toward functional and medical goals.  Function:  Bathing Bathing position   Position: Shower  Bathing parts Body parts bathed by patient: Right arm, Left arm, Chest, Abdomen, Buttocks, Front perineal area, Right upper leg, Left upper leg Body parts bathed by helper: Back  Bathing assist Assist Level: Supervision or verbal cues      Upper Body Dressing/Undressing Upper body dressing   What is the patient wearing?: Pull over shirt/dress     Pull over shirt/dress - Perfomed by patient: Thread/unthread right sleeve, Thread/unthread left sleeve, Put head through opening, Pull shirt over trunk       Orthosis activity level: Performed by patient  Upper body assist Assist Level: Supervision or verbal cues   Set up : To obtain clothing/put away  Lower Body Dressing/Undressing Lower body dressing   What is the patient wearing?: Pants Underwear - Performed by patient: Thread/unthread left underwear leg, Pull underwear up/down Underwear - Performed by helper: Thread/unthread right underwear leg Pants- Performed by patient: Thread/unthread right pants leg, Thread/unthread left pants leg, Pull pants up/down, Fasten/unfasten pants   Non-skid slipper socks- Performed by patient: Don/doff right sock, Don/doff left sock Non-skid slipper socks- Performed by helper: Don/doff right sock, Don/doff left sock Socks - Performed by patient: Don/doff right sock, Don/doff left sock  Lower body assist Assist for lower body dressing: Supervision or verbal cues      Toileting Toileting Toileting activity did not occur: No continent bowel/bladder event Toileting steps completed by patient: Adjust clothing prior to toileting, Performs perineal hygiene, Adjust clothing  after toileting Toileting steps completed by helper: Adjust clothing prior to toileting, Adjust clothing after toileting Toileting Assistive Devices: Grab bar or rail  Toileting assist Assist level: Supervision or verbal cues   Transfers Chair/bed transfer   Chair/bed transfer method: Stand pivot Chair/bed transfer assist level: Supervision or verbal cues Chair/bed transfer assistive device: Medical sales representative     Max distance: 170f Assist level: Supervision or verbal cues   Wheelchair   Type: Manual Max wheelchair distance: 1671f Assist Level: Supervision or verbal cues  Cognition Comprehension Comprehension assist level: Follows complex conversation/direction with extra time/assistive device  Expression Expression assist level: Expresses complex 90% of the time/cues < 10% of the time  Social Interaction Social Interaction assist level: Interacts appropriately 90% of the time - Needs monitoring or encouragement for participation or interaction.  Problem Solving Problem solving assist level: Solves basic problems with no assist  Memory Memory assist level: More than reasonable amount of time    Medical Problem List and Plan: 1. 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 Lumbar corset applied in sitting position  Cont CIR therapies. Continued education for patient/caregivers   2. DVT Prophylaxis/Anticoagulation: Eliquis 3. Pain Management:Lidoderm patch. Oxycodone as needed  -adding scheduled 71m83mxy IR to coincide with therapy activity 4. Mood: Provide emotional support 5. Neuropsych: This patient is somewhat capable of making decisions on hisown behalf. 6. Skin/Wound Care: Routine skin checks 7. Fluids/Electrolytes/Nutrition:    Follow BMP 8.Infectious disease: plan is for6 weeks penicillin G per infectious disease  TEE without vegetation.   -CRP down to 10.1 today, ESR up to  110, WBC's sl decreased to 9.5 9.New-onset atrial fibrillation as well as history of CAD with stenting. Cardizem 90 mg every 6 hours, Lopressor 125 mg twice a day(decrease to 100m40m below). Cardiac rate control.   -monitor for exercise tolerance with PT, check VS as needed 10.Hypertension. Lisinopril 20 mg daily and monitor with increased mobility  BP's a little better today.  -continue to hold lisinopril   -decrease lopressor to 100mg49m 11.Hyperlipidemia. Lipitor 12.Constipation. Laxative assistance 13. ABLA  Hb 12.2 on 2/7   Cont to monitor 14. Hypoalbuminemia  Supplement initiated 2/10 15. GERD  Protonix changed to BID  16. Cough: mild URI, afebrile, exam benign  -mucinex bid  IS   LOS (Days) 8 A FACE TO FACE EVALUATION WAS PERFORMED  Edinson Domeier T 03/07/2016 9:47 AM

## 2016-03-07 NOTE — Progress Notes (Signed)
Occupational Therapy Session Note  Patient Details  Name: Jonathan Holden MRN: [id number] Date of Birth: [birth year]  Today's Date: 03/07/2016 OT Individual Time: [id number] OT Individual Time Calculation (min): 76 min    Short Term Goals: Week 1:  OT Short Term Goal 1 (Week 1): Pt will ambulate and gather all needed clothing items in prep for bathing/dressing task with supervision OT Short Term Goal 2 (Week 1): Pt will don LSO mod I for increased independence with ADLs OT Short Term Goal 3 (Week 1): Pt will complete 2 grooming tasks in standing with distant supervision OT Short Term Goal 4 (Week 1): Pt will attend to functional task for 3 minutes with no more than 1 VC for redirection  Skilled Therapeutic Interventions/Progress Updates:    Pt completed grooming tasks sit to stand at the sink this session.  He declined bathing or changing clothing and just decided to shave, wash his face, brush his teeth, and apply new deodorant.  Pt hooked up to IV antibiotics so could not doff shirt and donn new one at this time and he decided to just keep on his shorts he had been wearing.  He did wash his peri area however but with limited thoroughness.  He used the RW to complete toilet transfer to the 3:1 over top of the regular toilet with supervision.  All clothing management completed sit to stand as well with supervision.  Once he had washed his hands, he donned his slip on shoes with supervision as well.  He then ambulated to the [facility] gym for work on attention and standing balance with use of the dynavision.  He completed 3 intervals in standing with rest breaks in between.  During intervals had him pressing lights that were lit for 2 seconds while stating 2 and 3 digit numbers in the T-scope.  He was able to state 8/10 numbers in 2 intervals and was able to complete 29/30 lights with each interval, dividing his attention.  Added in having to avoid pushing green lights but only push the red ones.  He was  accurate 90% of the time while stating 9/10 3 digit numbers in the T-scope.  Back pain remained 5 or less during these intervals.  Returned to room at end of session with pt transferring back to bed.  Finished session with discussion on application of grab bars at the front and inside walls of his shower, as he states he is going to have some put in.  Encouraged working on memory further at home with use of card and board games as well as other activities that will alternate his attention.  Pt left in bed with call button and phone in reach.     Therapy Documentation Precautions:  Precautions Precautions: Fall Required Braces or Orthoses: Spinal Brace (comfort only) Spinal Brace: Lumbar corset, Applied in sitting position Restrictions Weight Bearing Restrictions: No  Pain: Pain Assessment Pain Assessment: 0-10 Pain Score: 5  Pain Type: Chronic pain Pain Location: Back Pain Descriptors / Indicators: Discomfort Pain Onset: With Activity Pain Intervention(s): Repositioned;Emotional support ADL: See Function Navigator for Current Functional Status.   Therapy/Group: Individual Therapy  Keyunna Coco OTR/L 03/07/2016, 12:39 PM

## 2016-03-07 NOTE — Progress Notes (Signed)
Physical Therapy Session Note  Patient Details  Name: Jonathan Holden MRN: [id number] Date of Birth: 14-Aug-1945  Today's Date: 03/07/2016 PT Group Time: 1300-1400 PT Group Time Calculation (min): 60 min  Short Term Goals: Week 1:  PT Short Term Goal 1 (Week 1): Pt will be able to perform bed mobility with supervision PT Short Term Goal 2 (Week 1): Pt will be able to perform basic transfers with min assist and LRAD PT Short Term Goal 3 (Week 1): Pt will be able to gait x 150' with min assist with LRAD PT Short Term Goal 4 (Week 1): Pt will be able to complete 12 steps for strengthening with rails with min assist Week 2:     Skilled Therapeutic Interventions/Progress Updates:   Pt received supine in bed and agreeable to PT. Supine>sit transfer without cues or assist.   Stand pivot transfer to [facility] with supervision assist and RW.   Dynamic gait training with RW  to weave through 8 cones and step over 2 walking sticks. And up/down curb and over uneven surface x 2 each. PT provided supervision assist throughout and min cues for AD management.   Stair training instructed by PT x 12 with BUE support on Rails and supervision assist. Self selected step to gait pattern with descent and step over step with Ascent.    Gait through hall with RW to locate 7 horse shoes x 121f; pt able to retain # of horse shoes to collect in hall.   Pt returned to room and performed stand pivot transfer with RW to bed . Sit>supine completed without supervision and left supine in bed with call bell in reach and all needs met.        Therapy Documentation Precautions:  Precautions Precautions: Fall Required Braces or Orthoses: Spinal Brace (comfort only) Spinal Brace: Lumbar corset, Applied in sitting position Restrictions Weight Bearing Restrictions: No Pain: Pain Assessment Pain Assessment: 0-10 Pain Score: 5  Pain Type: Chronic pain Pain Location: Back Pain Descriptors / Indicators: Discomfort Pain  Onset: With Activity Pain Intervention(s): Repositioned;Emotional support   See Function Navigator for Current Functional Status.   Therapy/Group: Individual Therapy  ALorie Phenix2/17/2018, 4:13 PM

## 2016-03-07 NOTE — Discharge Summary (Signed)
Occupational Therapy Discharge Summary  Patient Details  Name: Jonathan Holden MRN: [id number] Date of Birth: Oct 11, 1945    Today's Date: 03/08/2016 OT Individual Time: 1000-1100 OT Individual Time Calculation (min): 60 min    Patient has met 10 of 10 long term goals due to improved activity tolerance, improved balance, postural control, ability to compensate for deficits, improved attention, improved awareness and improved coordination.  Patient to discharge at overall Supervision level.  Patient's care partner is independent to provide the necessary cognitive assistance at discharge.    All goals met.   Recommendation:  Patient will benefit from ongoing skilled OT services in home health setting to continue to advance functional skills in the area of BADL, Vocation and Reduce care partner burden.  Pt continues to demonstrate decreased memory as well as decreased divided attention and higher level cognitive processing.  Back pain also continues to be a limiting and distracting factor as well.  Overall he completes most selfcare tasks at a supervision level, requiring cueing to sequence through the task as well as cueing for safe use of the walker as he tends to leave it to the side when attempting to sit down instead of keeping it in front of him.  Feel he needs initial 24 hour supervision for safety at home with continued HHOT to progress to independent level.    Equipment: RW, wheelchair, 3:1  Reasons for discharge: treatment goals met and discharge from hospital  Patient/family agrees with progress made and goals achieved: Yes   Skilled Therapeutic Intervention:  Pt was lying in bed at time of arrival, agreeable to session. Back brace donned with setup at EOB. Tx focus on IADL retraining, problem solving, and d/c planning. Pt ambulated to therapy apartment with RW and supervision. He engaged in light housekeeping/home mgt tasks, ambulating around kitchen with min cues for walker safety.  Pt educated on various modifications in kitchen and home that he can implement to minimize back pain. Discussed his meaningful roles at discharge, including pet caregiving and tending to raised garden bed. Pt required min-mod cues for problem solving methods to modify task demands to increase safety and minimize strain on back. Afterwards pt ambulated back to room in manner as written above. Educated pt on overall progress, goal achievement, and home therapies. He verbalized understanding. At end of session pt was returned to bed and left with all needs within reach.     OT Discharge Precautions/Restrictions  Precautions Precautions: Fall Required Braces or Orthoses: Spinal Brace (for comfort) Spinal Brace: Lumbar corset;Applied in sitting position Restrictions Weight Bearing Restrictions: No     ADL ADL Comments: Please see functional navigator for ADL status   Vision/Perception  Vision- History Baseline Vision/History: Wears glasses Wears Glasses: Reading only Patient Visual Report: No change from baseline Vision- Assessment Vision Assessment?: No apparent visual deficits  Cognition Orientation Level: Oriented X4 Attention: Sustained;Selective;Divided Sustained Attention: Appears intact Selective Attention: Impaired Divided Attention: Impaired Divided Attention Impairment: Verbal complex Awareness: Impaired Problem Solving: Impaired Safety/Judgment: Impaired Sensation Sensation Light Touch: Appears Intact Stereognosis: Appears Intact Hot/Cold: Appears Intact Proprioception: Appears Intact Coordination Gross Motor Movements are Fluid and Coordinated: Yes Fine Motor Movements are Fluid and Coordinated: Yes Motor  Motor Motor - Discharge Observations: generalized weakness Mobility     Trunk/Postural Assessment  Cervical Assessment Cervical Assessment: Within Functional Limits Lumbar Assessment Lumbar Assessment: Exceptions to [facility] Lumbar Strength Overall Lumbar  Strength Comments: Pt with decreased lumbar strength and endurance secondary to back pain and herniated L3-L4  disk.   Balance Balance Balance Assessed: Yes Static Sitting Balance Static Sitting - Balance Support: Feet supported Static Sitting - Level of Assistance: 6: Modified independent (Device/Increase time) Dynamic Sitting Balance Dynamic Sitting - Balance Support: Feet supported;During functional activity Dynamic Sitting - Level of Assistance: 6: Modified independent (Device/Increase time) Static Standing Balance Static Standing - Balance Support: During functional activity Static Standing - Level of Assistance: 6: Modified independent (Device/Increase time) Dynamic Standing Balance Dynamic Standing - Balance Support: Bilateral upper extremity supported Dynamic Standing - Level of Assistance: 5: Stand by assistance Extremity/Trunk Assessment RUE Assessment RUE Assessment: Within Functional Limits LUE Assessment LUE Assessment: Within Functional Limits   See Function Navigator for Current Functional Status.  Jonathan Holden A Jonathan Holden OTR/L 03/08/2016, 8:53 PM

## 2016-03-07 NOTE — Progress Notes (Signed)
Patient c/o constipation.  Given dulcolax suppository at 1533.  Up to bathroom at approx 1600 with little results.  Patient straining to pass hard stool and became diaphoretic.  B/P 57/33.  Remained alert and oriented.  Returned to bed.  Continued to c/o rectal discomfort related to stool.  Color good, skin warm and dry.  B/P 95/50.  Fleets enema given with good results.

## 2016-03-08 ENCOUNTER — Inpatient Hospital Stay (HOSPITAL_COMMUNITY): Payer: Medicare Other

## 2016-03-08 ENCOUNTER — Inpatient Hospital Stay (HOSPITAL_COMMUNITY): Payer: Medicare Other | Admitting: Physical Therapy

## 2016-03-08 ENCOUNTER — Inpatient Hospital Stay (HOSPITAL_COMMUNITY): Payer: Medicare Other | Admitting: Occupational Therapy

## 2016-03-08 NOTE — Progress Notes (Signed)
Physical Therapy Session Note  Patient Details  Name: Jonathan Holden MRN: [id number] Date of Birth: Sep 03, 1945  Today's Date: 03/08/2016 PT Individual Time: 0700-0815 PT Individual Time Calculation (min): 75 min   Short Term Goals: Week 1:  PT Short Term Goal 1 (Week 1): Pt will be able to perform bed mobility with supervision PT Short Term Goal 2 (Week 1): Pt will be able to perform basic transfers with min assist and LRAD PT Short Term Goal 3 (Week 1): Pt will be able to gait x 150' with min assist with LRAD PT Short Term Goal 4 (Week 1): Pt will be able to complete 12 steps for strengthening with rails with min assist  Skilled Therapeutic Interventions/Progress Updates:    Pt received asleep in bed, easily awoken and agreeable to treatment. C/o pain as below and agreeable to treatment. Pt performed dressing with setupA on EOB with min cues for recall to don back brace, S for standing balance with RW. Adjusted equipment to prepare for d/c home, educated pt on breakdown of w/c to allow for easy transport. Assessed all mobility and outcome measures as described in d/c summary with S overall using RW. Pt c/o increased pain today, reports he is trying to wean off pain medication prior to d/c; utilized extended rest breaks to prevent pain from escalating and allow for participation in all sessions today. Discussed d/c plan with pt, including follow up therapy and caregiver comfort with providing S; pt very passive throughout session reporting "I let Molly worry about that", and "I don't listen too closely to what's going on because it all depends on who's saying what, too many cooks in the kitchen". Pt also occasionally tangential in speech, requiring cues to redirect to current topic of conversation. Educated pt on recommendation for S, use of w/c for community mobility, RW in the home, and HHPT follow up. Pt requested to return to bed at end of session; performed transfer and bed mobility with  S/modI. Remained supine in bed at end of session, all needs in reach.   Therapy Documentation Precautions:  Precautions Precautions: Fall Required Braces or Orthoses: Spinal Brace (comfort only) Spinal Brace: Lumbar corset, Applied in sitting position Restrictions Weight Bearing Restrictions: No Pain: Pain Assessment Pain Assessment: 0-10 Pain Score: 5  Pain Type: Chronic pain Pain Location: Back Pain Orientation: Right;Lower Pain Descriptors / Indicators: Aching;Discomfort;Sore Pain Onset: With Activity Pain Intervention(s): Repositioned;Ambulation/increased activity Multiple Pain Sites: No   See Function Navigator for Current Functional Status.   Therapy/Group: Individual Therapy  Luberta Mutter 03/08/2016, 9:31 AM

## 2016-03-08 NOTE — Progress Notes (Signed)
Occupational Therapy Session Note  Patient Details  Name: KEMAL ORMS MRN: [id number] Date of Birth: 1945/08/22  Today's Date: 03/08/2016 OT Individual Time:1000-1100 50 [employer]  Short Term Goals: Week 1:  OT Short Term Goal 1 (Week 1): Pt will ambulate and gather all needed clothing items in prep for bathing/dressing task with supervision OT Short Term Goal 2 (Week 1): Pt will don LSO mod I for increased independence with ADLs OT Short Term Goal 3 (Week 1): Pt will complete 2 grooming tasks in standing with distant supervision OT Short Term Goal 4 (Week 1): Pt will attend to functional task for 3 minutes with no more than 1 VC for redirection  Skilled Therapeutic Interventions/Progress Updates: ADL-retraining at sink side with focus on attention, functional mobility, grooming, and improved independence with donning LSO.  Pt was unable to bathe in shower d/t IV treatment ongoing.   Pt was re-educated on graduation day planned activities and completed donning LSO independently while seated and functional transfers while ambulating in room with RW to/from toilet and to/from tub bench in shower with only supervision assist.   Pt ambulated within room using RW with OT assist only to manage IV pole for safety.   Pt reports purchase of w/c, BSC, and RW.   Pt was re-educated on use of reacher for fall prevention while retrieving items and he plans to purchase this as well.   Pt sustained attention to grooming at sink for 7 minutes without distraction in controlled environment and then donned clean shirt with OT/RN assist to manage IV line through sleeve.  OT then demonstrated use of [employer] Magic Wand for back massage to manage chronic low back pain which provided moderate relief of symptoms. Pt left in room at end of session seated in w/c with all needs within reach.     Therapy Documentation Precautions:  Precautions Precautions: Fall Required Braces or Orthoses: Spinal Brace (comfort  only) Spinal Brace: Lumbar corset, Applied in sitting position Restrictions Weight Bearing Restrictions: No   Vital Signs: Therapy Vitals Temp: 97.9 F (36.6 C) Temp Source: Oral Pulse Rate: 88 Resp: 18 BP: (!) 99/58 Patient Position (if appropriate): Lying Oxygen Therapy SpO2: 95 % O2 Device: Not Delivered   Pain: Pain Assessment Pain Assessment: 0-10 Pain Score: 5  Pain Type: Chronic pain Pain Location: Back Pain Orientation: Right;Lower Pain Descriptors / Indicators: Aching;Discomfort;Sore Pain Onset: With Activity Pain Intervention(s): Repositioned;Ambulation/increased activity Multiple Pain Sites: No   ADL:     Exercises:     Other Treatments:    See Function Navigator for Current Functional Status.   Therapy/Group: Individual Therapy  Kynedi Profitt 03/08/2016, 7:35 AM

## 2016-03-08 NOTE — Progress Notes (Signed)
[place] PHYSICAL MEDICINE & REHABILITATION     PROGRESS NOTE  Subjective/Complaints:  Up with PT. Still claims he has cough. Pain under control.   ROS: pt denies nausea, vomiting, diarrhea, cough, shortness of breath or chest pain    Objective: Vital Signs: Blood pressure 116/72, pulse 85, temperature 97.9 F (36.6 C), temperature source Oral, resp. rate 18, weight 90.3 kg (199 lb), SpO2 95 %. No results found.  Recent Labs  03/06/16 0554  WBC 8.6  HGB 11.7*  HCT 35.1*  PLT 382    Recent Labs  03/06/16 0554  NA 131*  K 4.6  CL 96*  GLUCOSE 124*  BUN 11  CREATININE 0.95  CALCIUM 9.5   CBG (last 3)  No results for input(s): GLUCAP in the last 72 hours.  Wt Readings from Last 3 Encounters:  03/08/16 90.3 kg (199 lb)  02/28/16 96.5 kg (212 lb 11.9 oz)  02/17/16 95.3 kg (210 lb)    Physical Exam:  BP 116/72   Pulse 85   Temp 97.9 F (36.6 C) (Oral)   Resp 18   Wt 90.3 kg (199 lb)   SpO2 95%   BMI 26.25 kg/m  Constitutional: He appears well-developed. No distress.  HENT: Normocephalicand atraumatic. Cold sores around upper lip Eyes: EOMI. No discharge.  Cardiovascular: IRR/IRR Respiratory: chest clear GI: Soft. Bowel sounds are normal.  Musculoskeletal:  Low back is TTP at baseline Skin: Skin is warm. He is not diaphoretic.  Psychiatric:  Cooperative. STM deficits with improvement. Better insight and awareness Neurological: He is alert and oriented.  Motor: 5/5 bilateral deltoid, biceps, tricesp, wrist, hand intrinsics 4/5 bilateral hip flexors, knee extensors, ankle dorsiflexor --no change Sensation intact to light touch bilateral lower limbs to light touch. Motor and sensory exam stable.   Assessment/Plan: 1. Functional deficits secondary to discitis and ecephalopathy which require 3+ hours per day of interdisciplinary therapy in a comprehensive inpatient rehab setting. Physiatrist is providing close team supervision and 24 hour management of  active medical problems listed below. Physiatrist and rehab team continue to assess barriers to discharge/monitor patient progress toward functional and medical goals.  Function:  Bathing Bathing position   Position: Shower  Bathing parts Body parts bathed by patient: Right arm, Left arm, Chest, Abdomen, Buttocks, Front perineal area, Right upper leg, Left upper leg Body parts bathed by helper: Back  Bathing assist Assist Level: Supervision or verbal cues      Upper Body Dressing/Undressing Upper body dressing   What is the patient wearing?: Pull over shirt/dress     Pull over shirt/dress - Perfomed by patient: Thread/unthread right sleeve, Thread/unthread left sleeve, Put head through opening, Pull shirt over trunk       Orthosis activity level: Performed by patient  Upper body assist Assist Level: Supervision or verbal cues   Set up : To obtain clothing/put away  Lower Body Dressing/Undressing Lower body dressing   What is the patient wearing?: Shoes Underwear - Performed by patient: Thread/unthread left underwear leg, Pull underwear up/down Underwear - Performed by helper: Thread/unthread right underwear leg Pants- Performed by patient: Thread/unthread right pants leg, Thread/unthread left pants leg, Pull pants up/down, Fasten/unfasten pants   Non-skid slipper socks- Performed by patient: Don/doff right sock, Don/doff left sock Non-skid slipper socks- Performed by helper: Don/doff right sock, Don/doff left sock Socks - Performed by patient: Don/doff right sock, Don/doff left sock   Shoes - Performed by patient: Don/doff right shoe, Don/doff left shoe (slip on shoes)  Lower body assist Assist for lower body dressing: Supervision or verbal cues      Toileting Toileting Toileting activity did not occur: No continent bowel/bladder event Toileting steps completed by patient: Adjust clothing prior to toileting, Performs perineal hygiene, Adjust clothing after  toileting Toileting steps completed by helper: Adjust clothing prior to toileting, Adjust clothing after toileting Toileting Assistive Devices: Grab bar or rail  Toileting assist Assist level: Supervision or verbal cues   Transfers Chair/bed transfer   Chair/bed transfer method: Ambulatory Chair/bed transfer assist level: Supervision or verbal cues Chair/bed transfer assistive device: Armrests, Medical sales representative     Max distance: 142f  Assist level: Supervision or verbal cues   Wheelchair   Type: Manual Max wheelchair distance: 1668f Assist Level: Supervision or verbal cues  Cognition Comprehension Comprehension assist level: Follows basic conversation/direction with extra time/assistive device  Expression Expression assist level: Expresses complex 90% of the time/cues < 10% of the time  Social Interaction Social Interaction assist level: Interacts appropriately 90% of the time - Needs monitoring or encouragement for participation or interaction.  Problem Solving Problem solving assist level: Solves basic problems with no assist  Memory Memory assist level: Recognizes or recalls 75 - 89% of the time/requires cueing 10 - 24% of the time    Medical Problem List and Plan: 1. 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 Lumbar corset applied in sitting position  Cont CIR therapies. Continued education for patient/caregivers  -dc home tomorrow.   -Patient to see MD in the office for transitional care encounter in 1-2 weeks.  2. DVT Prophylaxis/Anticoagulation: Eliquis 3. Pain Management:Lidoderm patch. Oxycodone as needed  -continue scheduled 12m62mxy IR to coincide with therapy activity while here 4. Mood: Provide emotional support 5. Neuropsych: This patient is somewhat capable of making decisions on hisown behalf. 6. Skin/Wound Care: Routine skin checks 7. Fluids/Electrolytes/Nutrition:     Follow BMP 8.Infectious disease: plan is for6 weeks penicillin G per infectious disease  TEE without vegetation.   -CRP down to 10.1 today, ESR up to 110, WBC's sl decreased to 9.5 9.New-onset atrial fibrillation as well as history of CAD with stenting. Cardizem 90 mg every 6 hours, Lopressor 125 mg twice a day(decrease to 100m26m below). Cardiac rate control.   -monitor for exercise tolerance with PT, check VS as needed 10.Hypertension. Lisinopril 20 mg daily and monitor with increased mobility  BP's a little better as a whole.  -continue to hold lisinopril   -maintain lopressor at 100mg49m 11.Hyperlipidemia. Lipitor 12.Constipation. Laxative assistance 13. ABLA  Hb 12.2 on 2/7   Cont to monitor 14. Hypoalbuminemia  Supplement initiated 2/10 15. GERD  Protonix changed to BID  16. Cough: mild URI, afebrile, exam benign  -mucinex bid  IS   LOS (Days) 9 A FACE TO FACE EVALUATION WAS PERFORMED  SWARTZ,ZACHARY T 03/08/2016 9:31 AM

## 2016-03-09 ENCOUNTER — Encounter (HOSPITAL_COMMUNITY): Payer: Self-pay

## 2016-03-09 DIAGNOSIS — M4649 Discitis, unspecified, multiple sites in spine: Secondary | ICD-10-CM

## 2016-03-09 MED ORDER — CEFTRIAXONE IV (FOR PTA / DISCHARGE USE ONLY): 2.0000 g | INTRAVENOUS | 0 refills | Status: AC

## 2016-03-09 MED ORDER — PREDNISOLONE ACETATE 1 % OP SUSP: 1.0000 [drp] | Freq: Every day | OPHTHALMIC | 0 refills | Status: DC

## 2016-03-09 MED ORDER — GUAIFENESIN ER 600 MG PO TB12: 600.0000 mg | ORAL_TABLET | Freq: Two times a day (BID) | ORAL | 0 refills | Status: DC

## 2016-03-09 MED ORDER — LIDOCAINE 5 % EX PTCH: MEDICATED_PATCH | CUTANEOUS | 0 refills | Status: DC

## 2016-03-09 MED ORDER — PANTOPRAZOLE SODIUM 40 MG PO TBEC: 40.0000 mg | DELAYED_RELEASE_TABLET | Freq: Two times a day (BID) | ORAL | 0 refills | Status: DC

## 2016-03-09 MED ORDER — HEPARIN SOD (PORK) LOCK FLUSH 100 UNIT/ML IV SOLN
250.0000 [IU] | INTRAVENOUS | Status: AC | PRN
  Administered 2016-03-09: 250 [IU]

## 2016-03-09 MED ORDER — METOPROLOL TARTRATE 100 MG PO TABS: 100.0000 mg | ORAL_TABLET | Freq: Two times a day (BID) | ORAL | 0 refills | Status: DC

## 2016-03-09 MED ORDER — DEXTROSE 5 % IV SOLN: 2.0000 g | INTRAVENOUS | 1 refills | Status: DC

## 2016-03-09 MED ORDER — DEXTROSE 5 % IV SOLN
2.0000 g | INTRAVENOUS | Status: DC
  Administered 2016-03-09: 2 g via INTRAVENOUS
  Filled 2016-03-09: qty 2

## 2016-03-09 MED ORDER — DILTIAZEM HCL 90 MG PO TABS: 90.0000 mg | ORAL_TABLET | Freq: Four times a day (QID) | ORAL | 0 refills | Status: DC

## 2016-03-09 MED ORDER — APIXABAN 5 MG PO TABS: 5.0000 mg | ORAL_TABLET | Freq: Two times a day (BID) | ORAL | 0 refills | Status: DC

## 2016-03-09 MED ORDER — OXYCODONE-ACETAMINOPHEN 5-325 MG PO TABS: 1.0000 | ORAL_TABLET | Freq: Three times a day (TID) | ORAL | 0 refills | Status: DC | PRN

## 2016-03-09 MED ORDER — METOPROLOL TARTRATE 50 MG PO TABS
100.0000 mg | ORAL_TABLET | Freq: Two times a day (BID) | ORAL | Status: DC
  Administered 2016-03-09: 100 mg via ORAL

## 2016-03-09 MED ORDER — FOLIC ACID 1 MG PO TABS: 1.0000 mg | ORAL_TABLET | Freq: Every day | ORAL | 0 refills | Status: DC

## 2016-03-09 NOTE — Telephone Encounter (Signed)
LM to return call.

## 2016-03-09 NOTE — Progress Notes (Signed)
Patient has been limiting narcotic use in the past couple of days. Rx for Oxycodone 5 mg #21--one every 8 hours prn give to patient.

## 2016-03-09 NOTE — Progress Notes (Signed)
Pam with [facility] was to meet [facility] (caretaker) at 11:00 today for IV training.  Have not seen either party at this time.

## 2016-03-09 NOTE — Discharge Summary (Signed)
Discharge summary job # [id number]

## 2016-03-09 NOTE — Progress Notes (Signed)
[place] PHYSICAL MEDICINE & REHABILITATION     PROGRESS NOTE  Subjective/Complaints:   No issues overnite, pt is aware of d/c  ROS: pt denies nausea, vomiting, diarrhea, cough, shortness of breath or chest pain    Objective: Vital Signs: Blood pressure 105/63, pulse 79, temperature 98 F (36.7 C), temperature source Oral, resp. rate 18, weight 91 kg (200 lb 9.9 oz), SpO2 98 %. No results found. No results for input(s): WBC, HGB, HCT, PLT in the last 72 hours. No results for input(s): NA, K, CL, GLUCOSE, BUN, CREATININE, CALCIUM in the last 72 hours.  Invalid input(s): CO CBG (last 3)  No results for input(s): GLUCAP in the last 72 hours.  Wt Readings from Last 3 Encounters:  03/09/16 91 kg (200 lb 9.9 oz)  02/28/16 96.5 kg (212 lb 11.9 oz)  02/17/16 95.3 kg (210 lb)    Physical Exam:  BP 105/63 (BP Location: Left Arm)   Pulse 79   Temp 98 F (36.7 C) (Oral)   Resp 18   Wt 91 kg (200 lb 9.9 oz)   SpO2 98%   BMI 26.47 kg/m  Constitutional: He appears well-developed. No distress.  HENT: Normocephalicand atraumatic. Cold sores around upper lip Eyes: EOMI. No discharge.  Cardiovascular: IRR IRR Respiratory: Effort normaland breath sounds normal. Intermittent productive cough GI: Soft. Bowel sounds are normal.  Musculoskeletal:  Low back is TTP Skin: Skin is warm. He is not diaphoretic.  Psychiatric:  Cooperative. STM deficits with improvement. Better insight and awareness Neurological: He is alert and oriented.  Motor: 5/5 bilateral deltoid, biceps, tricesp, wrist, hand intrinsics 4/5 bilateral hip flexors, knee extensors, ankle dorsiflexor --no change Sensation intact to light touch bilateral lower limbs to light touch. Motor and sensory exam stable.   Assessment/Plan: 1. Functional deficits secondary to discitis and ecephalopathy which require 3+ hours per day of interdisciplinary therapy in a comprehensive inpatient rehab setting.  Stable for D/C  today F/u PCP in 3-4 weeks  F/u PM&R 2 weeks See D/C summary See D/C instructions  Function:  Bathing Bathing position   Position: Wheelchair/chair at sink  Bathing parts Body parts bathed by patient: Right arm, Chest, Left arm, Right upper leg, Left upper leg Body parts bathed by helper: Back  Bathing assist Assist Level: Supervision or verbal cues      Upper Body Dressing/Undressing Upper body dressing   What is the patient wearing?: Pull over shirt/dress, Orthosis     Pull over shirt/dress - Perfomed by patient: Thread/unthread right sleeve, Thread/unthread left sleeve, Put head through opening, Pull shirt over trunk       Orthosis activity level: Performed by patient  Upper body assist Assist Level: Supervision or verbal cues   Set up : To obtain clothing/put away  Lower Body Dressing/Undressing Lower body dressing   What is the patient wearing?: Pants, Non-skid slipper socks Underwear - Performed by patient: Thread/unthread left underwear leg, Pull underwear up/down Underwear - Performed by helper: Thread/unthread right underwear leg Pants- Performed by patient: Thread/unthread right pants leg, Thread/unthread left pants leg, Fasten/unfasten pants, Pull pants up/down (per pt report)   Non-skid slipper socks- Performed by patient: Don/doff right sock, Don/doff left sock Non-skid slipper socks- Performed by helper: Don/doff right sock, Don/doff left sock Socks - Performed by patient: Don/doff right sock, Don/doff left sock   Shoes - Performed by patient: Don/doff right shoe, Don/doff left shoe (slip on shoes)            Lower body  assist Assist for lower body dressing: Supervision or verbal cues      Toileting Toileting Toileting activity did not occur: No continent bowel/bladder event Toileting steps completed by patient: Adjust clothing prior to toileting, Performs perineal hygiene, Adjust clothing after toileting Toileting steps completed by helper: Adjust  clothing prior to toileting, Adjust clothing after toileting Toileting Assistive Devices: Grab bar or rail  Toileting assist Assist level: Supervision or verbal cues   Transfers Chair/bed transfer   Chair/bed transfer method: Ambulatory Chair/bed transfer assist level: Supervision or verbal cues Chair/bed transfer assistive device: Armrests, Medical sales representative     Max distance: 200 Assist level: Supervision or verbal cues   Wheelchair   Type: Manual Max wheelchair distance: 175' Assist Level: Supervision or verbal cues  Cognition Comprehension Comprehension assist level: Follows basic conversation/direction with extra time/assistive device  Expression Expression assist level: Expresses complex 90% of the time/cues < 10% of the time  Social Interaction Social Interaction assist level: Interacts appropriately 90% of the time - Needs monitoring or encouragement for participation or interaction.  Problem Solving Problem solving assist level: Solves basic 90% of the time/requires cueing < 10% of the time  Memory Memory assist level: Recognizes or recalls 75 - 89% of the time/requires cueing 10 - 24% of the time    Medical Problem List and Plan: 1. 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 Lumbar corset applied in sitting position Plan d/c today   2. DVT Prophylaxis/Anticoagulation: Eliquis 3. Pain Management:Lidoderm patch. Oxycodone as needed  -adding scheduled 13m oxy IR to coincide with therapy activity 4. Mood: Provide emotional support 5. Neuropsych: This patient is somewhat capable of making decisions on hisown behalf. 6. Skin/Wound Care: Routine skin checks 7. Fluids/Electrolytes/Nutrition:    Follow BMP 8.Infectious disease: plan is for6 weeks penicillin G per infectious disease  TEE without vegetation.   -CRP down to 10.1  ESR  110, WBC's  9.5 9.New-onset atrial fibrillation  as well as history of CAD with stenting. Cardizem 90 mg every 6 hours, Lopressor 125 mg twice a day(decrease to 1080mas below). Cardiac rate control.   -monitor for exercise tolerance with PT, check VS as needed 10.Hypertension. Lisinopril 20 mg daily and monitor with increased mobility Vitals:   03/08/16 1500 03/09/16 0541  BP: 112/61 105/63  Pulse: 79 79  Resp: 19 18  Temp: 98.8 F (37.1 C) 98 F (36.7 C)     -continue to hold lisinopril f/u PCP  -decrease lopressor to 1006mID 11.Hyperlipidemia. Lipitor 12.Constipation. Laxative assistance 13. ABLA  Hb 12.3 on 2/9   Cont to monitor 14. Hypoalbuminemia  Supplement initiated 2/10 15. GERD  Protonix changed to BID  16. Cough: mild URI, afebrile, exam benign  -mucinex bid  IS   LOS (Days) 10 A FACE TO FACE EVALUATION WAS PERFORMED  KIRCharlett Blake19/2018 7:39 AM

## 2016-03-09 NOTE — Telephone Encounter (Signed)
LMOM to follow up with [facility].

## 2016-03-09 NOTE — Progress Notes (Signed)
Caretaker verbalized d/c instructions with an understanding of bowels, back brace, follow up appts and follow ups with [facility], verbalized understanding of medications and explained importance of continuing the eliquis. Gave caretaker coupon for Eliquis, hard scrip for narcotics and instruction to contact [facility] for appt time for tomorrow.

## 2016-03-09 NOTE — Discharge Summary (Signed)
Jonathan Holden, Jonathan Holden              ACCOUNT NO.:  [id number]  MEDICAL RECORD NO.:  [id number]  LOCATION:  [id number]                        FACILITY:  [facility]  PHYSICIAN:  Charlett Blake, M.D.DATE OF BIRTH:  [birth year]  DATE OF ADMISSION:  02/28/2016 DATE OF DISCHARGE:  03/09/2016                              DISCHARGE SUMMARY   DISCHARGE DIAGNOSES: 1. Encephalopathy-bacteremia-lumbar diskitis. 2. Eliquis for deep venous thrombosis prophylaxis, pain management. 3. New onset atrial fibrillation with history of coronary artery     disease and stenting. 4. Hypertension. 5. Hyperlipidemia. 6. Constipation. 7. Acute blood loss anemia. 8. Decreased nutritional storage. 9. Gastroesophageal reflux disease.  HISTORY OF PRESENT ILLNESS:  This is a 71 year old right-handed male with history of hypertension, CAD with stenting as well as previous laminectomy L4-5 and L5-S1.  He lives with his girlfriend, two-level home, independent prior to admission.  Presented on February 21, 2016, with low back pain and altered mental status.  He was initially seen in the ED on February 17, 2016, given prescription for steroids, hydrocodone, and sent home.  A recent MRI as an outpatient showed herniated disk at L3-4 and previous surgical changes.  The patient gradually became more confused, returned back to the ED, found to be in AFib with RVR.  Alcohol urine drug screen is negative.  Fever 102.  WBC 11000.  Lactic acid unremarkable.  Troponin 0.06.  Placed on broad- spectrum antibiotics, suspect sepsis.  Neurosurgery consulted.  MRI reviewed that did not mention disk space infection, however, diskitis was suspect at L4-5 and S1.  A lumbar corset was ordered when out of bed.  Infectious Disease consulted.  Pathogen consistent with streptococcal bacteremia, placed on high-dose penicillin x6 weeks. Cardiology consulted to address atrial fibrillation, is currently maintained on intravenous heparin and  transitioned to Eliquis.  Dr. Latanya Maudlin of Orthopedic Services would also be consulted as he had been following the patient for his back pain.  Echocardiogram with ejection fraction of 60%, no wall motion abnormalities.  Bouts ongoing of confusion that continued to improve.  Physical and occupational therapy ongoing.  The patient was admitted for a comprehensive rehab program.  PAST MEDICAL HISTORY:  See discharge diagnoses.  SOCIAL HISTORY:  Lives with girlfriend, independent prior to admission.  FUNCTIONAL STATUS:  Upon admission to rehab services was min-to-mod assist 70 feet, rolling walker; moderate assist, sit to stand; and mod- to-max assist, activities of daily living.  PHYSICAL EXAMINATION:  VITAL SIGNS:  Blood pressure 137/79, pulse 103, temperature 98, and respirations 18. GENERAL:  This was an alert male, well developed, in no acute distress, cooperative.  He was a bit impulsive.  He was oriented to person, date of birth, and place. LUNGS:  Clear to auscultation without wheeze. CARDIAC:  Regular rate and rhythm.  No murmur. ABDOMEN:  Soft and nontender.  Good bowel sounds.  Back corset was in place.  REHABILITATION HOSPITAL COURSE:  The patient was admitted to inpatient rehab services with therapies initiated on a 3-hour daily basis consisting of physical therapy, occupational therapy, speech therapy, and rehabilitation nursing.  The following issues were addressed during the patient's rehabilitation stay.  Pertaining to Mr. Becknell encephalopathy bacteremia with diskitis remained  stable.  He continued on high-dose penicillin changed to rocephin 2 gram daily for cost after discussed  with per Infectious Disease and would follow up outpatient with Dr. Latanya Maudlin.  He remained on Eliquis for new onset atrial fibrillation, cardiac rate controlled.  No chest pain or shortness of breath.  He also continued on Lopressor. Blood pressures overall well controlled.  He  continued on Lipitor for hyperlipidemia.  Bouts of constipation resolved with laxative assistance.  Acute blood loss anemia, 12.3 and stable.  Ongoing bouts of confusion, he continued to improve.  He did receive followup by speech therapy for cognitive issues.  The patient often internally distracted by attempting to rationalize all information.  He was able to follow complex conversation direct with extra time assistive device, able to express his full needs.  Social interaction 90% of the time.  Solves basic problems with no assistance.  His recall and memory had greatly improved.  All the issues in regard to his safety discussed with his girlfriend and need for supervision for his safety.  Full family teaching was completed.  The patient was working with balance improved postural control, ambulating with his back corset when out of bed, sit to stand supervision, and ambulating supervision.  He can gather his belongings for activities of daily living and homemaking.  It was discussed no driving.  Plan was to discharge to home.  DISCHARGE MEDICATIONS:  At the time of dictation included: 1. Zovirax ointment q.i.d. 2. Eliquis 5 mg p.o. b.i.d. 3. Lipitor 40 mg at bedtime. 4. Tums 200 mg p.o. b.i.d. 5. Folic acid 1 mg daily. 6. Lidoderm patch, 2 patches apply every 24 hours. 7. Lopressor 100 mg p.o. b.i.d. 8. Multivitamin daiy 9.Protonix 40 mg p.o. b.i.d. 10.Prednisone Forte 1 drop left eye 1% daily. 11.Rocephin 2 gram daily until 04/05/2016 12.Percocet 5/325 mg 1 tablet every 4 hours as needed, pain. 13.Tylenol as needed.  DIET:  His diet was regular.  SPECIAL INSTRUCTIONS:  No driving.  Back corset when out of bed.  Home health nurse to administer intravenous Rocephin 2 gram daily until April 05, 2016 and stop.  [facility] set up for ongoing therapies.  The patient will follow up with Dr. Alysia Penna at the outpatient rehab center as directed; Dr. Mertie Moores,  Cardiology Services to call for appointment; Dr. Latanya Maudlin, Orthopedic Service to call for appointment; Dr. Bobby Rumpf, call for appointment; and Dr. Daneen Schick, March 18, 2016.     Lauraine Rinne, P.A.   ______________________________ Charlett Blake, M.D.    DA/MEDQ  D:  03/09/2016  T:  03/09/2016  Job:  [id number]  cc:   Thayer Headings, M.D. Ronald A. Gladstone Lighter, M.D. Charlett Blake, M.D. Doroteo Bradford. Johnnye Sima, M.D.

## 2016-03-09 NOTE — Progress Notes (Signed)
Attempted to review d/c information while caretaker in room.  Instructed to wear back brace when OOB, encourage to get up and about and limit lying around as may increase risk of PNU.  Caretaker anxious and unable to redirect for education going home. Caretaker informed this nurse that has talked to everyone involved and has everything she needs for discharge and will be back in about 45 min to take him home.  Informed caregiver that PA will provide d/c instructions and caregiver left.

## 2016-03-09 NOTE — Progress Notes (Signed)
Social Work Patient ID: Jonathan Holden, male   DOB: Sep 15, 1945, 71 y.o.   MRN: [id number]  Met with Mollie when here and did go through training with Pam[facility] RN. Mollie going back home to get comfortable vehicle for pt to ride home in. Aware recommendation is 24 hr supervision. Pt very passive in all of this and doesn't ask many questions and just wants to get home.

## 2016-03-09 NOTE — Plan of Care (Signed)
Problem: Consults Goal: Skin Care Protocol Initiated - if Braden Score 18 or less If consults are not indicated, leave blank or document N/A  Outcome: Completed/Met Date Met: 03/09/16 Caretaker able to verbalize understanding of skin care to meet pt needs.   Problem: RH BOWEL ELIMINATION Goal: RH STG MANAGE BOWEL WITH ASSISTANCE STG Manage Bowel with min Assistance.   Outcome: Completed/Met Date Met: 03/09/16 Caretaker able to verbalize reasons for bowel medications to ensure proper evacuation.

## 2016-03-09 NOTE — Progress Notes (Signed)
PHARMACY CONSULT NOTE FOR:  OUTPATIENT  PARENTERAL ANTIBIOTIC THERAPY (OPAT)  Indication: diskitis Regimen: ceftriaxone 2g IV daily End date: 04/05/16  Received verbal orders from Dr. Baxter Flattery to change regimen from penicillin to ceftriaxone due to cost for outpatient administration.  IV antibiotic discharge orders are pended. To discharging provider:  please sign these orders via discharge navigator,  Select New Orders & click on the button choice - Manage This Unsigned Work.     Thank you for allowing pharmacy to be a part of this patient's care.  Gwenlyn Perking, PharmD PGY1 Pharmacy Resident Pager: [phone] 03/09/2016 11:39 AM

## 2016-03-09 NOTE — Progress Notes (Signed)
Social Work  Discharge Note  The overall goal for the admission was met for:   Discharge location: [place] 24 HR SUPERVISION  Length of Stay: Yes-10 DAYS  Discharge activity level: Yes-SUPERVISION LEVEL  Home/community participation: Yes  Services provided included: MD, RD, PT, OT, SLP, RN, CM, Pharmacy and [place]: Medicare and Private Insurance: [place]  Follow-up services arranged: Home Health: [facility]-PT,OT,RN, DME: [facility] and Patient/Family has no preference for HH/DME agencies  Comments (or additional information):PT [place]. BOTH AWARE OF THERAPY TEAM RECOMMENDATION OF 24 HR SUPERVISION. GIVEN MOLLIE PRIVATE DUTY AGENCY LIST TO HIRE ASSIST IF NEEDED. PT NEEDS TO BE PUSHED AT TIMES GIVES UP EASILY IN THERAPY. MOLLIE EXPRESSED SEVERAL CONCERNS WHILE PT WAS HERE AND  WANTED HIM TRANSFERRED TO [facility] WHICH MD FELT WAS NOT APPROPRIATE-FELT MEDICAL STABLE FOR DISCHARGE.  Patient/Family verbalized understanding of follow-up arrangements: Yes  Individual responsible for coordination of the follow-up plan: SELF & MOLLIE-GIRLFRIEND  Confirmed correct DME delivered: Elease Hashimoto 03/09/2016    Elease Hashimoto

## 2016-03-09 NOTE — Progress Notes (Signed)
Received call from [facility], informed me that she is on the way

## 2016-03-09 NOTE — Progress Notes (Signed)
Jonathan Holden is here for IV training. Will complete IV med at noon prior to d/c and home IV medication will start tomorrow.

## 2016-03-10 ENCOUNTER — Encounter (HOSPITAL_COMMUNITY): Payer: Self-pay

## 2016-03-10 ENCOUNTER — Telehealth: Payer: Self-pay | Admitting: Interventional Cardiology

## 2016-03-10 ENCOUNTER — Emergency Department (HOSPITAL_COMMUNITY): Payer: Medicare Other

## 2016-03-10 ENCOUNTER — Inpatient Hospital Stay (HOSPITAL_COMMUNITY)
Admission: EM | Admit: 2016-03-10 | Discharge: 2016-03-12 | DRG: 872 | Disposition: A | Discharge status: Home Health | Payer: Medicare Other | Attending: Internal Medicine | Admitting: Internal Medicine

## 2016-03-10 DIAGNOSIS — E785 Hyperlipidemia, unspecified: Secondary | ICD-10-CM | POA: Diagnosis not present

## 2016-03-10 DIAGNOSIS — I10 Essential (primary) hypertension: Secondary | ICD-10-CM | POA: Diagnosis present

## 2016-03-10 DIAGNOSIS — Z825 Family history of asthma and other chronic lower respiratory diseases: Secondary | ICD-10-CM

## 2016-03-10 DIAGNOSIS — I2510 Atherosclerotic heart disease of native coronary artery without angina pectoris: Secondary | ICD-10-CM | POA: Diagnosis present

## 2016-03-10 DIAGNOSIS — Z452 Encounter for adjustment and management of vascular access device: Secondary | ICD-10-CM | POA: Diagnosis not present

## 2016-03-10 DIAGNOSIS — Z8249 Family history of ischemic heart disease and other diseases of the circulatory system: Secondary | ICD-10-CM

## 2016-03-10 DIAGNOSIS — R740 Nonspecific elevation of levels of transaminase and lactic acid dehydrogenase [LDH]: Secondary | ICD-10-CM | POA: Diagnosis present

## 2016-03-10 DIAGNOSIS — M4646 Discitis, unspecified, lumbar region: Secondary | ICD-10-CM | POA: Diagnosis present

## 2016-03-10 DIAGNOSIS — I959 Hypotension, unspecified: Secondary | ICD-10-CM | POA: Diagnosis not present

## 2016-03-10 DIAGNOSIS — B955 Unspecified streptococcus as the cause of diseases classified elsewhere: Secondary | ICD-10-CM | POA: Diagnosis present

## 2016-03-10 DIAGNOSIS — I252 Old myocardial infarction: Secondary | ICD-10-CM

## 2016-03-10 DIAGNOSIS — Z7901 Long term (current) use of anticoagulants: Secondary | ICD-10-CM

## 2016-03-10 DIAGNOSIS — G9340 Encephalopathy, unspecified: Secondary | ICD-10-CM | POA: Diagnosis not present

## 2016-03-10 DIAGNOSIS — N179 Acute kidney failure, unspecified: Secondary | ICD-10-CM | POA: Diagnosis not present

## 2016-03-10 DIAGNOSIS — I272 Pulmonary hypertension, unspecified: Secondary | ICD-10-CM | POA: Diagnosis not present

## 2016-03-10 DIAGNOSIS — Z955 Presence of coronary angioplasty implant and graft: Secondary | ICD-10-CM

## 2016-03-10 DIAGNOSIS — Z87891 Personal history of nicotine dependence: Secondary | ICD-10-CM

## 2016-03-10 DIAGNOSIS — Z79899 Other long term (current) drug therapy: Secondary | ICD-10-CM

## 2016-03-10 DIAGNOSIS — K219 Gastro-esophageal reflux disease without esophagitis: Secondary | ICD-10-CM | POA: Diagnosis present

## 2016-03-10 DIAGNOSIS — I480 Paroxysmal atrial fibrillation: Secondary | ICD-10-CM | POA: Diagnosis present

## 2016-03-10 DIAGNOSIS — R55 Syncope and collapse: Secondary | ICD-10-CM | POA: Diagnosis present

## 2016-03-10 DIAGNOSIS — A419 Sepsis, unspecified organism: Principal | ICD-10-CM | POA: Diagnosis present

## 2016-03-10 DIAGNOSIS — D62 Acute posthemorrhagic anemia: Secondary | ICD-10-CM | POA: Diagnosis not present

## 2016-03-10 DIAGNOSIS — Z792 Long term (current) use of antibiotics: Secondary | ICD-10-CM | POA: Diagnosis not present

## 2016-03-10 DIAGNOSIS — R7881 Bacteremia: Secondary | ICD-10-CM | POA: Diagnosis not present

## 2016-03-10 DIAGNOSIS — Z86718 Personal history of other venous thrombosis and embolism: Secondary | ICD-10-CM | POA: Diagnosis not present

## 2016-03-10 LAB — PROTIME-INR
INR: 1.35
PROTHROMBIN TIME: 16.8 s — AB (ref 11.4–15.2)

## 2016-03-10 LAB — CBC WITH DIFFERENTIAL/PLATELET
Basophils Absolute: 0.1 10*3/uL (ref 0.0–0.1)
Basophils Relative: 0 %
Eosinophils Absolute: 0.2 10*3/uL (ref 0.0–0.7)
Eosinophils Relative: 1 %
HEMATOCRIT: 39.1 % (ref 39.0–52.0)
HEMOGLOBIN: 13.1 g/dL (ref 13.0–17.0)
LYMPHS ABS: 1.2 10*3/uL (ref 0.7–4.0)
Lymphocytes Relative: 10 %
MCH: 31.7 pg (ref 26.0–34.0)
MCHC: 33.5 g/dL (ref 30.0–36.0)
MCV: 94.7 fL (ref 78.0–100.0)
Monocytes Absolute: 1.1 10*3/uL — ABNORMAL HIGH (ref 0.1–1.0)
Monocytes Relative: 9 %
NEUTROS PCT: 80 %
Neutro Abs: 9.7 10*3/uL — ABNORMAL HIGH (ref 1.7–7.7)
Platelets: 403 10*3/uL — ABNORMAL HIGH (ref 150–400)
RBC: 4.13 MIL/uL — AB (ref 4.22–5.81)
RDW: 13.8 % (ref 11.5–15.5)
WBC: 12.2 10*3/uL — AB (ref 4.0–10.5)

## 2016-03-10 LAB — COMPREHENSIVE METABOLIC PANEL
ALBUMIN: 2.6 g/dL — AB (ref 3.5–5.0)
ALT: 86 U/L — AB (ref 17–63)
ANION GAP: 11 (ref 5–15)
AST: 55 U/L — ABNORMAL HIGH (ref 15–41)
Alkaline Phosphatase: 86 U/L (ref 38–126)
BUN: 20 mg/dL (ref 6–20)
CHLORIDE: 95 mmol/L — AB (ref 101–111)
CO2: 25 mmol/L (ref 22–32)
Calcium: 9.4 mg/dL (ref 8.9–10.3)
Creatinine, Ser: 1.58 mg/dL — ABNORMAL HIGH (ref 0.61–1.24)
GFR calc non Af Amer: 42 mL/min — ABNORMAL LOW (ref >60)
GFR, EST AFRICAN AMERICAN: 49 mL/min — AB (ref >60)
GLUCOSE: 121 mg/dL — AB (ref 65–99)
Potassium: 5.1 mmol/L (ref 3.5–5.1)
SODIUM: 131 mmol/L — AB (ref 135–145)
Total Bilirubin: 0.1 mg/dL — ABNORMAL LOW (ref 0.3–1.2)
Total Protein: 7.5 g/dL (ref 6.5–8.1)

## 2016-03-10 LAB — URINALYSIS, ROUTINE W REFLEX MICROSCOPIC
Bilirubin Urine: NEGATIVE
GLUCOSE, UA: NEGATIVE mg/dL
HGB URINE DIPSTICK: NEGATIVE
Ketones, ur: NEGATIVE mg/dL
LEUKOCYTES UA: NEGATIVE
NITRITE: NEGATIVE
PROTEIN: 30 mg/dL — AB
Specific Gravity, Urine: 1.023 (ref 1.005–1.030)
pH: 5 (ref 5.0–8.0)

## 2016-03-10 LAB — TROPONIN I: Troponin I: <0.03 ng/mL (ref <0.03)

## 2016-03-10 LAB — I-STAT CG4 LACTIC ACID, ED
Lactic Acid, Venous: 1.78 mmol/L (ref 0.5–1.9)
Lactic Acid, Venous: 0.68 mmol/L (ref 0.5–1.9)

## 2016-03-10 MED ORDER — SODIUM CHLORIDE 0.9 % IV BOLUS (SEPSIS)
1000.0000 mL | Freq: Once | INTRAVENOUS | Status: AC
  Administered 2016-03-10: 1000 mL via INTRAVENOUS

## 2016-03-10 NOTE — Telephone Encounter (Signed)
New message    Sherrill from [facility] is calling  Pt c/o BP issue: STAT if pt c/o blurred vision, one-sided weakness or slurred speech  1. What are your last 5 BP readings? palpitated 100/60 manual cuff 80 systolic 0000000 per Home health Nurse  2. Are you having any other symptoms (ex. Dizziness, headache, blurred vision, passed out)? Dizziness, weakness  3. What is your BP issue? Home health nurse stating that pt bp is difficult to find   Pt c/o medication issue:  1. Name of Medication: metoprolol  2. How are you currently taking this medication (dosage and times per day)?   3. Are you having a reaction (difficulty breathing--STAT)? no  4. What is your medication issue? Home health nurse states that pt is supposed to have this medication but does not have a script or any pills for this medicine.

## 2016-03-10 NOTE — Telephone Encounter (Signed)
Jonathan Holden, [facility] nurse in with pt today.  States unable to get BP manually, thinks systolic possibly 80 and when she tried to palpate it she thought she got systolic at [id number], but unsure.  HR is 128.  Metoprolol was not picked up from pharmacy so pt had not started it.  Advised prescription was sent in.  Pt's girlfriend will go to pharmacy to pick up.  Pt went out earlier for hair cut and worked out with PT today. Pt c/o dizziness and extreme fatigue today. Denies CP and SOB.  Nails beds are purple, O2 SATs 96-97%.  Lungs are clear per [place].  Pt constantly shivering even with 4 blankets on him.  Spoke with Dr. Curt Bears, DOD and he recommended pt go back to ER for evaluation and hold off on Metoprolol since BP is low.  Advised HH nurse of this and she spoke with pt who was in agreement with this plan.   Spoke with pt and received verbal permission to speak with his girlfriend Jonathan Holden.  Called Mollie and lmtcb.

## 2016-03-10 NOTE — ED Triage Notes (Signed)
Pt reports his home health nurse sent him here today to ER for hypotension, "systolic around 80" associated with tachycardia and chills. Recently admitted for sepsis and source was infection was "in my spine." pt is A&O4 with reports of feeling lightheaded.

## 2016-03-10 NOTE — Telephone Encounter (Signed)
Spoke with pt. He is a week overdue for his next Praluent injection. States someone told him in the hospital to stop taking it because it was a supplement, which is incorrect. Pt is still regaining energy after his hospital discharge yesterday. He will call next week to come in for a Praluent sample. Will also fill out financial assistance paperwork through [employer] for Praluent coverage.

## 2016-03-10 NOTE — H&P (Signed)
History and Physical    DAVE MERGEN QBH:[id number] DOB: 09-20-1945 DOA: 03/10/2016  PCP: Sinclair Grooms, MD   Patient coming from: Home  Chief Complaint: Chills, sweats, light-headedness  HPI: Jonathan Holden is a 71 y.o. gentleman with a history of CAD S/P stent, HTN, HLD, prior MI, paroxysmal atrial fibrillation (CHADS-Vasc 3, anticoagulated with Eliquis), and recent admission from 2/2 - 2/9 for lumbar diskitis and streptococcal bacteremia followed by admission to acute rehab.  The patient was just discharged yesterday.  This afternoon, he developed chills and sweats but no fever.  He had light-headedness but no LOC.  No chest pain or palpitations.  [facility] RN was there to set up his home antibiotic therapy and reportedly could not consistent blood pressures.  Systolic blood pressure 80 or less.  He called the cardiology office and was advised to report to the ED for evaluation.  No nausea or vomiting.  No dysuria.  No diarrhea.  No new rashes.  He has cough intermittently productive of phlegm. Decreased appetite.  Low back pain has been stable.  Now walks with a walker at baseline.  ED Course: Chest xray negative for acute process.  U/A negative for infection.  WBC count 12.2.  Lactic acid level 1.78.  Creatinine 1.58.  AST 55.  ALT 86.    Blood cultures drawn, one from PICC, one peripheral.  The patient received 1L NS.  Hospitalist asked to admit.  Review of Systems: As per HPI otherwise 10 point review of systems negative.    Past Medical History:  Diagnosis Date  . Arthritis HANDS AND RIGHT SHOULDER  . Coronary artery disease CARDIOLOGIST- DR Daneen Schick- LAST VISIT 6 MON AGO--  REQUESTED NOTE, EKG, ECHO, STRESS TEST   PT DENIES S & S  . H/O gastric ulcer   . History of MI (myocardial infarction) 1992  . Hyperlipidemia   . Hypertension   . Loose body of right shoulder   . S/P coronary angioplasty     Past Surgical History:  Procedure Laterality Date  .  CARDIOVASCULAR STRESS TEST  08-22-2008---  DR Daneen Schick   NO ISCHEMIA.   Marland Kitchen CORONARY ANGIOPLASTY  1992  . ERCP W/ SPHICTEROTOMY  12-15-2002   AND STONE EXTRACTION  . LAPAROSCOPIC CHOLECYSTECTOMY  12-16-2002  . LUMBAR LAMINECTOMY  20 YRS AGO   L5  . TEE WITHOUT CARDIOVERSION N/A 02/28/2016   Procedure: TRANSESOPHAGEAL ECHOCARDIOGRAM (TEE);  Surgeon: Fay Records, MD;  Location: [facility];  Service: Cardiovascular;  Laterality: N/A;     reports that he quit smoking about 25 years ago. His smoking use included Cigarettes. He has a 40.00 pack-year smoking history. He has never used smokeless tobacco. He reports that he drinks about 4.2 oz of alcohol per week . He reports that he does not use drugs.  Allergies  Allergen Reactions  . Other Other (See Comments)    Pt reports allergic reaction to melons - unknown reaction  . Ciprofloxacin Hives    Family History  Problem Relation Age of Onset  . Other Mother     AGE 21 HEALTHY  . Heart attack Father 39    2 MI  . Emphysema    . Other Brother     HEALTHY     Prior to Admission medications   Medication Sig Start Date End Date Taking? Authorizing Provider  cefTRIAXone (ROCEPHIN) IVPB Inject 2 g into the vein daily. Indication:  Diskitis, bacteremia Last Day of Therapy:  04/05/16  Labs - Once weekly:  CBC/D and BMP, Labs - Every other week:  ESR and CRP 03/09/16 04/05/16 Yes Ivan Anchors Love, PA-C  Multiple Vitamin (MULTIVITAMIN WITH MINERALS) TABS tablet Take 1 tablet by mouth daily. Centrum   Yes Historical Provider, MD  oxyCODONE-acetaminophen (PERCOCET/ROXICET) 5-325 MG tablet Take 1 tablet by mouth every 8 (eight) hours as needed for severe pain. 03/09/16  Yes Ivan Anchors Love, PA-C  prednisoLONE acetate (PRED FORTE) 1 % ophthalmic suspension Place 1 drop into the left eye daily. 03/09/16  Yes Ivan Anchors Love, PA-C  Alirocumab (PRALUENT) 150 MG/ML SOPN Inject 1 pen into the skin every 14 (fourteen) days.    Historical Provider, MD  apixaban  (ELIQUIS) 5 MG TABS tablet Take 1 tablet (5 mg total) by mouth 2 (two) times daily. 03/09/16   Bary Leriche, PA-C  atorvastatin (LIPITOR) 40 MG tablet Take 1 tablet (40 mg total) by mouth daily. 02/29/16   Allie Bossier, MD  folic acid (FOLVITE) 1 MG tablet Take 1 tablet (1 mg total) by mouth daily. 03/09/16   Bary Leriche, PA-C  guaiFENesin (MUCINEX) 600 MG 12 hr tablet Take 1 tablet (600 mg total) by mouth 2 (two) times daily. 03/09/16   Ivan Anchors Love, PA-C  lidocaine (LIDODERM) 5 % Apply to back at 11 pm and remove at 11 am daily. 03/09/16   Bary Leriche, PA-C  Melatonin 3 MG TABS Take 3 tablets (9 mg total) by mouth at bedtime as needed (sleep). 02/28/16   Allie Bossier, MD  metoprolol (LOPRESSOR) 100 MG tablet Take 1 tablet (100 mg total) by mouth 2 (two) times daily. 03/09/16   Ivan Anchors Love, PA-C  pantoprazole (PROTONIX) 40 MG tablet Take 1 tablet (40 mg total) by mouth 2 (two) times daily. 03/09/16   Bary Leriche, PA-C  thiamine 100 MG tablet Take 1 tablet (100 mg total) by mouth daily. 02/29/16   Allie Bossier, MD    Physical Exam: Vitals:   03/10/16 2215 03/10/16 2230 03/10/16 2245 03/10/16 2300  BP: 111/71 108/69 116/86 130/79  Pulse: 87 85 88 90  Resp: 17 17 17 20   Temp:      TempSrc:      SpO2: 98% 97% 98% 99%  Weight:      Height:          Constitutional: NAD, calm, comfortable Vitals:   03/10/16 2215 03/10/16 2230 03/10/16 2245 03/10/16 2300  BP: 111/71 108/69 116/86 130/79  Pulse: 87 85 88 90  Resp: 17 17 17 20   Temp:      TempSrc:      SpO2: 98% 97% 98% 99%  Weight:      Height:       Eyes: PERRL, lids and conjunctivae normal ENMT: Deferred.  Patient wearing mask. Neck: normal appearance, supple, no masses Respiratory: clear to auscultation bilaterally, no wheezing, no crackles. Normal respiratory effort. No accessory muscle use.  Cardiovascular: Normal rate, regular rhythm, no murmurs / rubs / gallops. No extremity edema. 2+ pedal pulses. GI: abdomen is soft  and compressible.  No distention.  No tenderness.  Bowel sounds are present. Musculoskeletal:  No joint deformity in upper and lower extremities. Good ROM, no contractures. Normal muscle tone.  Skin: no rashes, warm and dry.  Local erythema at PICC insertion site (right arm) but no induration, tenderness, or active drainage. Neurologic: CN 2-12 grossly intact. Sensation intact, Strength symmetric bilaterally. Psychiatric: Normal judgment and insight. Alert and oriented x  3. Normal mood.     Labs on Admission: I have personally reviewed following labs and imaging studies  CBC:  Recent Labs Lab 03/06/16 0554 03/10/16 1655  WBC 8.6 12.2*  NEUTROABS  --  9.7*  HGB 11.7* 13.1  HCT 35.1* 39.1  MCV 94.6 94.7  PLT 382 811*   Basic Metabolic Panel:  Recent Labs Lab 03/06/16 0554 03/10/16 1655  NA 131* 131*  K 4.6 5.1  CL 96* 95*  CO2 25 25  GLUCOSE 124* 121*  BUN 11 20  CREATININE 0.95 1.58*  CALCIUM 9.5 9.4   GFR: Estimated Creatinine Clearance: 49.9 mL/min (by C-G formula based on SCr of 1.58 mg/dL (H)). Liver Function Tests:  Recent Labs Lab 03/10/16 1655  AST 55*  ALT 86*  ALKPHOS 86  BILITOT 0.1*  PROT 7.5  ALBUMIN 2.6*   Coagulation Profile:  Recent Labs Lab 03/10/16 1655  INR 1.35   Cardiac Enzymes:  Recent Labs Lab 03/10/16 1757  TROPONINI <0.03   Urine analysis:    Component Value Date/Time   COLORURINE AMBER (A) 03/10/2016 1856   APPEARANCEUR HAZY (A) 03/10/2016 1856   LABSPEC 1.023 03/10/2016 1856   PHURINE 5.0 03/10/2016 1856   GLUCOSEU NEGATIVE 03/10/2016 1856   HGBUR NEGATIVE 03/10/2016 1856   BILIRUBINUR NEGATIVE 03/10/2016 1856   KETONESUR NEGATIVE 03/10/2016 1856   PROTEINUR 30 (A) 03/10/2016 1856   NITRITE NEGATIVE 03/10/2016 1856   LEUKOCYTESUR NEGATIVE 03/10/2016 1856   Sepsis Labs:  Lactic acid level 1.78, Repeat 0.68  Radiological Exams on Admission: Dg Chest Portable 1 View  Result Date: 03/10/2016 CLINICAL DATA:   Sepsis. EXAM: PORTABLE CHEST 1 VIEW COMPARISON:  Chest radiograph February 27, 2016 FINDINGS: RIGHT peripherally inserted central venous catheter looped at the brachiocephalic confluence, distal tip projecting in proximal superior vena cava. Cardiac silhouette is mildly enlarged and unchanged. Biapical pleural thickening. Mediastinal silhouette is nonsuspicious. No pleural effusion or focal consolidation. No pneumothorax. Moderate degenerative change of the RIGHT shoulder. IMPRESSION: Stable cardiomegaly, no acute pulmonary process. Retracted PICC distal tip now projects in proximal superior vena cava. Electronically Signed   By: Elon Alas M.D.   On: 03/10/2016 18:30    EKG: Independently reviewed. Sinus tachycardia.  Assessment/Plan Principal Problem:   Sepsis ([place]) Active Problems:   Streptococcal bacteremia   Lumbar discitis   Near syncope   Hypotension      Sepsis, etiology unclear but differential in this patient includes line infection, recurrent bacteremia, endocarditis.  Supposed to be on Rocephin 2g daily until 04/05/2016. --No fever here, normal lactic acid level. BP responded to 1L of NS in the ED.  Plan to continue IV Rocephin for now.  Low threshold to broaden coverage for fever or any sign of decompenation. --Will leave PICC in place for now; blood cultures pending. --Check procalcitonin level --Hydrate with NS --Check echo in the AM --Serial troponin  Relative hypotension --Reduce dose of metoprolol for now and monitor --I anticipate he will not be able to come off of this medication completely because of his history of atrial fibrillation.  It also appears that cardizem was stopped before he was discharged from rehab (?due to relative hypotension) --HOLD lisinopril --Check orthostatic vital signs --Hydrate with NS  Atrial fibrillation --Anticoagulated with Eliquis --Telemetry monitoring  Chronic back pain --Analgesics as needed  Recent admission for  diskitis --Consider repeat imaging of his back as indicated  AKI --Likely related to relative hypotension at his point.   --Hydrating with NS --Repeat BMP  in the AM  Mild transaminitis --Appears stable       DVT prophylaxis: Anticoagulated with Eliquis Code Status: FULL Family Communication: Two friends present at bedside in the ED at the time of admission. Disposition Plan: To be determined. Consults called: NONE Admission status: Place in observation with telemetry monitoring.   TIME SPENT: 60 minutes   Eber Jones MD [facility] Pager [phone]  If 7PM-7AM, please contact night-coverage [email or web address] Password [facility]  03/10/2016, 11:48 PM

## 2016-03-10 NOTE — Telephone Encounter (Signed)
Spoke with [place], pt's girlfriend.  She asked about pt starting Praluent back.  Advised her that Pharmacist, Jinny Blossom, spoke with pt today and advised him to call next week about coming in for samples.  Mollie verbalized understanding.  Mollie also mentioned while on the phone that she wanted to make sure that Dr. Tamala Julian was aware of everything going on with the pt.  Pt has been in the hospital since 2/9 and was discharged yesterday. Had new onset Afib and sepsis.  Pt now back at hospital which was recommended by Dr. Curt Bears, DOD, due to symptoms pt was having today.  I advised her that I would make Dr. Tamala Julian aware that pt is back in hospital.  [facility] appreciative for assistance.

## 2016-03-10 NOTE — ED Provider Notes (Signed)
[place] DEPT Provider Note   CSN: [id number] Arrival date & time: 03/10/16  1632     History   Chief Complaint Chief Complaint  Patient presents with  . Hypotension  . Chills    HPI Jonathan Holden is a 71 y.o. male.  HPI Patient was recently admitted to the hospital for bacteremia and discitis. And on IV Rocephin. Discharged from rehabilitation yesterday. States he was doing well overnight. Started to have some lightheadedness. Home health nurse was able to get patient's blood pressure. Patient admitted to diaphoresis and cough. Denies any fever. Has ongoing low back pain which is unchanged. No focal weakness or numbness. Denies chest pain or shortness of breath. No new lower extremity swelling. Patient states the lightheadedness is worse with standing. Has improved since onset. Past Medical History:  Diagnosis Date  . Arthritis HANDS AND RIGHT SHOULDER  . Coronary artery disease CARDIOLOGIST- DR Daneen Schick- LAST VISIT 6 MON AGO--  REQUESTED NOTE, EKG, ECHO, STRESS TEST   PT DENIES S & S  . H/O gastric ulcer   . History of MI (myocardial infarction) 1992  . Hyperlipidemia   . Hypertension   . Loose body of right shoulder   . S/P coronary angioplasty     Patient Active Problem List   Diagnosis Date Noted  . Near syncope 03/10/2016  . Gastroesophageal reflux disease   . Acute blood loss anemia   . Hypoalbuminemia due to protein-calorie malnutrition ([place])   . Benign essential HTN   . New onset a-fib ([place])   . Bacteremia   . Pulmonary hypertension   . Low back pain   . Ileus ([place])   . Endocarditis of native valve   . Infection of intervertebral disc ([place])   . Streptococcal bacteremia   . Lumbar discitis   . Sepsis ([place]) 02/21/2016  . Hyponatremia 02/21/2016  . Atrial fibrillation with RVR ([place]) 02/21/2016  . Back pain 02/21/2016  . Acute encephalopathy 02/21/2016  . CAD (coronary artery disease), native coronary artery 09/19/2014  . Essential hypertension  09/19/2014  . Hyperlipidemia 10/03/2013    Past Surgical History:  Procedure Laterality Date  . CARDIOVASCULAR STRESS TEST  08-22-2008---  DR Daneen Schick   NO ISCHEMIA.   Marland Kitchen CORONARY ANGIOPLASTY  1992  . ERCP W/ SPHICTEROTOMY  12-15-2002   AND STONE EXTRACTION  . LAPAROSCOPIC CHOLECYSTECTOMY  12-16-2002  . LUMBAR LAMINECTOMY  20 YRS AGO   L5  . TEE WITHOUT CARDIOVERSION N/A 02/28/2016   Procedure: TRANSESOPHAGEAL ECHOCARDIOGRAM (TEE);  Surgeon: Fay Records, MD;  Location: [facility] ENDOSCOPY;  Service: Cardiovascular;  Laterality: N/A;       Home Medications    Prior to Admission medications   Medication Sig Start Date End Date Taking? Authorizing Provider  cefTRIAXone (ROCEPHIN) IVPB Inject 2 g into the vein daily. Indication:  Diskitis, bacteremia Last Day of Therapy:  04/05/16 Labs - Once weekly:  CBC/D and BMP, Labs - Every other week:  ESR and CRP 03/09/16 04/05/16 Yes Ivan Anchors Love, PA-C  Multiple Vitamin (MULTIVITAMIN WITH MINERALS) TABS tablet Take 1 tablet by mouth daily. Centrum   Yes Historical Provider, MD  oxyCODONE-acetaminophen (PERCOCET/ROXICET) 5-325 MG tablet Take 1 tablet by mouth every 8 (eight) hours as needed for severe pain. 03/09/16  Yes Ivan Anchors Love, PA-C  prednisoLONE acetate (PRED FORTE) 1 % ophthalmic suspension Place 1 drop into the left eye daily. 03/09/16  Yes Ivan Anchors Love, PA-C  Alirocumab (PRALUENT) 150 MG/ML SOPN Inject 1 pen into  the skin every 14 (fourteen) days.    Historical Provider, MD  apixaban (ELIQUIS) 5 MG TABS tablet Take 1 tablet (5 mg total) by mouth 2 (two) times daily. 03/09/16   Bary Leriche, PA-C  atorvastatin (LIPITOR) 40 MG tablet Take 1 tablet (40 mg total) by mouth daily. 02/29/16   Allie Bossier, MD  folic acid (FOLVITE) 1 MG tablet Take 1 tablet (1 mg total) by mouth daily. 03/09/16   Bary Leriche, PA-C  guaiFENesin (MUCINEX) 600 MG 12 hr tablet Take 1 tablet (600 mg total) by mouth 2 (two) times daily. 03/09/16   Ivan Anchors Love, PA-C    lidocaine (LIDODERM) 5 % Apply to back at 11 pm and remove at 11 am daily. 03/09/16   Bary Leriche, PA-C  Melatonin 3 MG TABS Take 3 tablets (9 mg total) by mouth at bedtime as needed (sleep). 02/28/16   Allie Bossier, MD  metoprolol (LOPRESSOR) 100 MG tablet Take 1 tablet (100 mg total) by mouth 2 (two) times daily. 03/09/16   Ivan Anchors Love, PA-C  pantoprazole (PROTONIX) 40 MG tablet Take 1 tablet (40 mg total) by mouth 2 (two) times daily. 03/09/16   Bary Leriche, PA-C  thiamine 100 MG tablet Take 1 tablet (100 mg total) by mouth daily. 02/29/16   Allie Bossier, MD    Family History Family History  Problem Relation Age of Onset  . Other Mother     AGE 64 HEALTHY  . Heart attack Father 33    2 MI  . Emphysema    . Other Brother     HEALTHY    Social History Social History  Substance Use Topics  . Smoking status: Former Smoker    Packs/day: 2.00    Years: 20.00    Types: Cigarettes    Quit date: 03/27/1990  . Smokeless tobacco: Never Used  . Alcohol use 4.2 oz/week    7 Glasses of wine per week     Allergies   Other and Ciprofloxacin   Review of Systems Review of Systems  Constitutional: Positive for chills and diaphoresis. Negative for fever.  Respiratory: Positive for cough. Negative for shortness of breath and wheezing.   Cardiovascular: Negative for chest pain, palpitations and leg swelling.  Gastrointestinal: Negative for abdominal pain, diarrhea, nausea and vomiting.  Genitourinary: Negative for dysuria, flank pain and frequency.  Musculoskeletal: Positive for back pain. Negative for arthralgias, myalgias, neck pain and neck stiffness.  Skin: Negative for rash and wound.  Neurological: Positive for dizziness and light-headedness. Negative for syncope, weakness, numbness and headaches.  All other systems reviewed and are negative.    Physical Exam Updated Vital Signs BP 119/77   Pulse 92   Temp 97.9 F (36.6 C) (Oral)   Resp 17   Ht 6' 2"  (1.88 m)   Wt  194 lb (88 kg)   SpO2 95%   BMI 24.91 kg/m   Physical Exam  Constitutional: He is oriented to person, place, and time. He appears well-developed and well-nourished. No distress.  HENT:  Head: Normocephalic and atraumatic.  Mouth/Throat: Oropharynx is clear and moist.  Eyes: EOM are normal. Pupils are equal, round, and reactive to light.  Neck: Normal range of motion. Neck supple. No JVD present.  Cardiovascular: Normal rate and regular rhythm.  Exam reveals no gallop and no friction rub.   No murmur heard. Pulmonary/Chest: Effort normal and breath sounds normal. No respiratory distress. He has no wheezes. He has  no rales. He exhibits no tenderness.  Abdominal: Soft. Bowel sounds are normal. There is no tenderness. There is no rebound and no guarding.  Musculoskeletal: Normal range of motion. He exhibits no edema or tenderness.  No lower extremity swelling, asymmetry or tenderness. Patient has PICC line in right upper extremity. No evidence of infection around the site  Neurological: He is alert and oriented to person, place, and time.  5/5 motor in all extremities. Sensation fully intact.  Skin: Skin is warm and dry. No rash noted. No erythema.  Psychiatric: He has a normal mood and affect. His behavior is normal.  Nursing note and vitals reviewed.    ED Treatments / Results  Labs (all labs ordered are listed, but only abnormal results are displayed) Labs Reviewed  COMPREHENSIVE METABOLIC PANEL - Abnormal; Notable for the following:       Result Value   Sodium 131 (*)    Chloride 95 (*)    Glucose, Bld 121 (*)    Creatinine, Ser 1.58 (*)    Albumin 2.6 (*)    AST 55 (*)    ALT 86 (*)    Total Bilirubin 0.1 (*)    GFR calc non Af Amer 42 (*)    GFR calc Af Amer 49 (*)    All other components within normal limits  CBC WITH DIFFERENTIAL/PLATELET - Abnormal; Notable for the following:    WBC 12.2 (*)    RBC 4.13 (*)    Platelets 403 (*)    Neutro Abs 9.7 (*)    Monocytes  Absolute 1.1 (*)    All other components within normal limits  PROTIME-INR - Abnormal; Notable for the following:    Prothrombin Time 16.8 (*)    All other components within normal limits  URINALYSIS, ROUTINE W REFLEX MICROSCOPIC - Abnormal; Notable for the following:    Color, Urine AMBER (*)    APPearance HAZY (*)    Protein, ur 30 (*)    Bacteria, UA RARE (*)    Squamous Epithelial / LPF 0-5 (*)    All other components within normal limits  CULTURE, BLOOD (ROUTINE X 2)  CULTURE, BLOOD (ROUTINE X 2)  TROPONIN I  I-STAT CG4 LACTIC ACID, ED  I-STAT CG4 LACTIC ACID, ED    EKG  EKG Interpretation None       Radiology Dg Chest Portable 1 View  Result Date: 03/10/2016 CLINICAL DATA:  Sepsis. EXAM: PORTABLE CHEST 1 VIEW COMPARISON:  Chest radiograph February 27, 2016 FINDINGS: RIGHT peripherally inserted central venous catheter looped at the brachiocephalic confluence, distal tip projecting in proximal superior vena cava. Cardiac silhouette is mildly enlarged and unchanged. Biapical pleural thickening. Mediastinal silhouette is nonsuspicious. No pleural effusion or focal consolidation. No pneumothorax. Moderate degenerative change of the RIGHT shoulder. IMPRESSION: Stable cardiomegaly, no acute pulmonary process. Retracted PICC distal tip now projects in proximal superior vena cava. Electronically Signed   By: Elon Alas M.D.   On: 03/10/2016 18:30    Procedures Procedures (including critical care time)  Medications Ordered in ED Medications  sodium chloride 0.9 % bolus 1,000 mL (0 mLs Intravenous Stopped 03/10/16 2152)     Initial Impression / Assessment and Plan / ED Course  I have reviewed the triage vital signs and the nursing notes.  Pertinent labs & imaging results that were available during my care of the patient were reviewed by me and considered in my medical decision making (see chart for details).     Initial hypotension  in triage. Given IV fluids with  improvement of blood pressure. Patient with increase in creatinine, white blood cell count. Cultures drawn. No source of infection identified. Normal lactic acid 2. Discussed with Dr. Eulas Post. We'll see patient in the emergency department and admit for observation. Final Clinical Impressions(s) / ED Diagnoses   Final diagnoses:  Hypotension, unspecified hypotension type    New Prescriptions New Prescriptions   No medications on file     Julianne Rice, MD 03/10/16 2225

## 2016-03-10 NOTE — Telephone Encounter (Signed)
Ms. Jonathan Holden is calling back. Please call, thanks.

## 2016-03-10 NOTE — ED Notes (Signed)
Dr. Lita Mains informed of pts possible sepsis.

## 2016-03-11 ENCOUNTER — Encounter (HOSPITAL_COMMUNITY): Payer: Self-pay

## 2016-03-11 ENCOUNTER — Telehealth: Payer: Self-pay | Admitting: Interventional Cardiology

## 2016-03-11 ENCOUNTER — Observation Stay (HOSPITAL_COMMUNITY): Payer: Medicare Other

## 2016-03-11 DIAGNOSIS — I959 Hypotension, unspecified: Secondary | ICD-10-CM | POA: Diagnosis not present

## 2016-03-11 DIAGNOSIS — A419 Sepsis, unspecified organism: Secondary | ICD-10-CM | POA: Diagnosis present

## 2016-03-11 DIAGNOSIS — I10 Essential (primary) hypertension: Secondary | ICD-10-CM | POA: Diagnosis present

## 2016-03-11 DIAGNOSIS — Z825 Family history of asthma and other chronic lower respiratory diseases: Secondary | ICD-10-CM | POA: Diagnosis not present

## 2016-03-11 DIAGNOSIS — I480 Paroxysmal atrial fibrillation: Secondary | ICD-10-CM | POA: Diagnosis not present

## 2016-03-11 DIAGNOSIS — I4891 Unspecified atrial fibrillation: Secondary | ICD-10-CM | POA: Diagnosis not present

## 2016-03-11 DIAGNOSIS — R7881 Bacteremia: Secondary | ICD-10-CM

## 2016-03-11 DIAGNOSIS — K219 Gastro-esophageal reflux disease without esophagitis: Secondary | ICD-10-CM | POA: Diagnosis present

## 2016-03-11 DIAGNOSIS — R55 Syncope and collapse: Secondary | ICD-10-CM

## 2016-03-11 DIAGNOSIS — N179 Acute kidney failure, unspecified: Secondary | ICD-10-CM | POA: Diagnosis present

## 2016-03-11 DIAGNOSIS — Z79899 Other long term (current) drug therapy: Secondary | ICD-10-CM | POA: Diagnosis not present

## 2016-03-11 DIAGNOSIS — Z7901 Long term (current) use of anticoagulants: Secondary | ICD-10-CM | POA: Diagnosis not present

## 2016-03-11 DIAGNOSIS — Z8249 Family history of ischemic heart disease and other diseases of the circulatory system: Secondary | ICD-10-CM | POA: Diagnosis not present

## 2016-03-11 DIAGNOSIS — I2510 Atherosclerotic heart disease of native coronary artery without angina pectoris: Secondary | ICD-10-CM | POA: Diagnosis not present

## 2016-03-11 DIAGNOSIS — Z955 Presence of coronary angioplasty implant and graft: Secondary | ICD-10-CM | POA: Diagnosis not present

## 2016-03-11 DIAGNOSIS — M4646 Discitis, unspecified, lumbar region: Secondary | ICD-10-CM | POA: Diagnosis not present

## 2016-03-11 DIAGNOSIS — Z87891 Personal history of nicotine dependence: Secondary | ICD-10-CM | POA: Diagnosis not present

## 2016-03-11 DIAGNOSIS — I252 Old myocardial infarction: Secondary | ICD-10-CM | POA: Diagnosis not present

## 2016-03-11 DIAGNOSIS — E785 Hyperlipidemia, unspecified: Secondary | ICD-10-CM | POA: Diagnosis present

## 2016-03-11 DIAGNOSIS — R740 Nonspecific elevation of levels of transaminase and lactic acid dehydrogenase [LDH]: Secondary | ICD-10-CM | POA: Diagnosis present

## 2016-03-11 LAB — TSH: TSH: 1.148 u[IU]/mL (ref 0.350–4.500)

## 2016-03-11 LAB — TROPONIN I
Troponin I: 0.03 ng/mL (ref <0.03)
Troponin I: <0.03 ng/mL (ref <0.03)

## 2016-03-11 LAB — ECHOCARDIOGRAM COMPLETE
CHL CUP DOP CALC LVOT VTI: 22.4 cm
CHL CUP STROKE VOLUME: 53 mL
E decel time: 331 msec
EERAT: 4.79
FS: 29 % (ref 28–44)
HEIGHTINCHES: 74.016 in
IVS/LV PW RATIO, ED: 1.08
LA ID, A-P, ES: 39 mm
LA diam index: 1.82 cm/m2
LA vol A4C: 53.9 ml
LA vol index: 24.7 mL/m2
LAVOL: 52.9 mL
LDCA: 4.52 cm2
LEFT ATRIUM END SYS DIAM: 39 mm
LV E/e'average: 4.79
LV e' LATERAL: 9.14 cm/s
LV sys vol index: 19 mL/m2
LVDIAVOL: 93 mL (ref 62–150)
LVDIAVOLIN: 43 mL/m2
LVEEMED: 4.79
LVOT SV: 101 mL
LVOT peak grad rest: 3 mmHg
LVOT peak vel: 89.3 cm/s
LVOTD: 24 mm
LVSYSVOL: 40 mL (ref 21–61)
MV Dec: 331
MV pk A vel: 70.7 m/s
MV pk E vel: 43.8 m/s
MVAP: 2.27 cm2
P 1/2 time: 97 ms
PW: 12 mm — AB (ref 0.6–1.1)
RV LATERAL S' VELOCITY: 13.4 cm/s
RV TAPSE: 23.3 mm
Simpson's disk: 57
TDI e' lateral: 9.14
TDI e' medial: 6.64
WEIGHTICAEL: 3089.6 [oz_av]

## 2016-03-11 LAB — BASIC METABOLIC PANEL
Anion gap: 12 (ref 5–15)
BUN: 17 mg/dL (ref 6–20)
CHLORIDE: 98 mmol/L — AB (ref 101–111)
CO2: 25 mmol/L (ref 22–32)
CREATININE: 0.95 mg/dL (ref 0.61–1.24)
Calcium: 9 mg/dL (ref 8.9–10.3)
GFR calc Af Amer: >60 mL/min (ref >60)
GFR calc non Af Amer: >60 mL/min (ref >60)
Glucose, Bld: 120 mg/dL — ABNORMAL HIGH (ref 65–99)
Potassium: 4.1 mmol/L (ref 3.5–5.1)
Sodium: 135 mmol/L (ref 135–145)

## 2016-03-11 LAB — CBC
HEMATOCRIT: 33.7 % — AB (ref 39.0–52.0)
HEMOGLOBIN: 11.1 g/dL — AB (ref 13.0–17.0)
MCH: 31.1 pg (ref 26.0–34.0)
MCHC: 32.9 g/dL (ref 30.0–36.0)
MCV: 94.4 fL (ref 78.0–100.0)
Platelets: 308 10*3/uL (ref 150–400)
RBC: 3.57 MIL/uL — ABNORMAL LOW (ref 4.22–5.81)
RDW: 13.8 % (ref 11.5–15.5)
WBC: 7.9 10*3/uL (ref 4.0–10.5)

## 2016-03-11 LAB — INFLUENZA PANEL BY PCR (TYPE A & B)
INFLAPCR: NEGATIVE
Influenza B By PCR: NEGATIVE

## 2016-03-11 LAB — PROCALCITONIN: Procalcitonin: 0.12 ng/mL

## 2016-03-11 MED ORDER — ADULT MULTIVITAMIN W/MINERALS CH
1.0000 | ORAL_TABLET | Freq: Every day | ORAL | Status: DC
  Administered 2016-03-12: 1 via ORAL
  Filled 2016-03-11 (×2): qty 1

## 2016-03-11 MED ORDER — ONDANSETRON HCL 4 MG PO TABS: 4.0000 mg | ORAL_TABLET | Freq: Four times a day (QID) | ORAL | Status: DC | PRN

## 2016-03-11 MED ORDER — OXYCODONE-ACETAMINOPHEN 5-325 MG PO TABS: 1.0000 | ORAL_TABLET | Freq: Three times a day (TID) | ORAL | Status: DC | PRN

## 2016-03-11 MED ORDER — PREDNISOLONE ACETATE 1 % OP SUSP
1.0000 [drp] | Freq: Every day | OPHTHALMIC | Status: DC
  Administered 2016-03-11: 1 [drp] via OPHTHALMIC
  Filled 2016-03-11: qty 1

## 2016-03-11 MED ORDER — SODIUM CHLORIDE 0.9% FLUSH
3.0000 mL | Freq: Two times a day (BID) | INTRAVENOUS | Status: DC
  Administered 2016-03-11: 3 mL via INTRAVENOUS

## 2016-03-11 MED ORDER — THIAMINE HCL 100 MG PO TABS: 100.0000 mg | ORAL_TABLET | Freq: Every day | ORAL | Status: DC

## 2016-03-11 MED ORDER — SODIUM CHLORIDE 0.9 % IV SOLN
INTRAVENOUS | Status: AC
  Administered 2016-03-11: 100 mL/h via INTRAVENOUS

## 2016-03-11 MED ORDER — POLYETHYLENE GLYCOL 3350 17 G PO PACK
17.0000 g | PACK | Freq: Every day | ORAL | Status: DC
  Administered 2016-03-11 – 2016-03-12 (×2): 17 g via ORAL
  Filled 2016-03-11 (×2): qty 1

## 2016-03-11 MED ORDER — ACETAMINOPHEN 325 MG PO TABS: 650.0000 mg | ORAL_TABLET | Freq: Four times a day (QID) | ORAL | Status: DC | PRN

## 2016-03-11 MED ORDER — VITAMIN B-1 100 MG PO TABS
100.0000 mg | ORAL_TABLET | Freq: Every day | ORAL | Status: DC
  Administered 2016-03-12: 100 mg via ORAL
  Filled 2016-03-11 (×2): qty 1

## 2016-03-11 MED ORDER — DEXTROSE 5 % IV SOLN
2.0000 g | INTRAVENOUS | Status: DC
  Administered 2016-03-11 – 2016-03-12 (×2): 2 g via INTRAVENOUS
  Filled 2016-03-11 (×2): qty 2

## 2016-03-11 MED ORDER — ACETAMINOPHEN 650 MG RE SUPP: 650.0000 mg | Freq: Four times a day (QID) | RECTAL | Status: DC | PRN

## 2016-03-11 MED ORDER — ATORVASTATIN CALCIUM 40 MG PO TABS
40.0000 mg | ORAL_TABLET | Freq: Every day | ORAL | Status: DC
  Administered 2016-03-11: 40 mg via ORAL
  Filled 2016-03-11: qty 1

## 2016-03-11 MED ORDER — APIXABAN 5 MG PO TABS
5.0000 mg | ORAL_TABLET | Freq: Two times a day (BID) | ORAL | Status: DC
  Administered 2016-03-11 – 2016-03-12 (×3): 5 mg via ORAL
  Filled 2016-03-11 (×3): qty 1

## 2016-03-11 MED ORDER — LIDOCAINE 5 % EX PTCH
1.0000 | MEDICATED_PATCH | Freq: Every day | CUTANEOUS | Status: DC
  Administered 2016-03-11: 1 via TRANSDERMAL
  Filled 2016-03-11: qty 1

## 2016-03-11 MED ORDER — SODIUM CHLORIDE 0.9% FLUSH
10.0000 mL | INTRAVENOUS | Status: DC | PRN
  Administered 2016-03-11 – 2016-03-12 (×2): 10 mL
  Filled 2016-03-11 (×2): qty 40

## 2016-03-11 MED ORDER — METOPROLOL TARTRATE 25 MG PO TABS
25.0000 mg | ORAL_TABLET | Freq: Two times a day (BID) | ORAL | Status: DC
  Administered 2016-03-11 – 2016-03-12 (×2): 25 mg via ORAL
  Filled 2016-03-11 (×2): qty 1

## 2016-03-11 MED ORDER — FOLIC ACID 1 MG PO TABS
1.0000 mg | ORAL_TABLET | Freq: Every day | ORAL | Status: DC
  Administered 2016-03-12: 1 mg via ORAL
  Filled 2016-03-11 (×2): qty 1

## 2016-03-11 MED ORDER — METOPROLOL TARTRATE 12.5 MG HALF TABLET
12.5000 mg | ORAL_TABLET | Freq: Two times a day (BID) | ORAL | Status: DC
  Administered 2016-03-11: 12.5 mg via ORAL
  Filled 2016-03-11: qty 1

## 2016-03-11 MED ORDER — METOPROLOL TARTRATE 5 MG/5ML IV SOLN
5.0000 mg | Freq: Four times a day (QID) | INTRAVENOUS | Status: DC | PRN
  Administered 2016-03-11: 5 mg via INTRAVENOUS
  Filled 2016-03-11: qty 5

## 2016-03-11 MED ORDER — ONDANSETRON HCL 4 MG/2ML IJ SOLN: 4.0000 mg | Freq: Four times a day (QID) | INTRAMUSCULAR | Status: DC | PRN

## 2016-03-11 MED ORDER — PANTOPRAZOLE SODIUM 40 MG PO TBEC
40.0000 mg | DELAYED_RELEASE_TABLET | Freq: Two times a day (BID) | ORAL | Status: DC
  Administered 2016-03-11 – 2016-03-12 (×2): 40 mg via ORAL
  Filled 2016-03-11 (×3): qty 1

## 2016-03-11 NOTE — Progress Notes (Signed)
Telemetry notified RN that patient had converted to normal sinus rhythm with heart ratein the 90s. Dr Eliseo Squires notified of this.

## 2016-03-11 NOTE — Telephone Encounter (Signed)
Spoke with girlfriend and she states that the pharmacist at the hospital advised they contact our office because pt's medications need to be evaluated by cardiology.  She states that they told her the amount of Metoprolol that pt is on along with his other medications should really be reviewed with cards.  Advised her that now that pt is back in the hospital d/t hypotension it is likely those meds could change.  She states pt and she are very concerned and would feel better if Dr. Tamala Julian looked over everything and made recommendations.  Advised I would send message to him.  Mollie appreciative for assistance.

## 2016-03-11 NOTE — Telephone Encounter (Signed)
New Message     Pt went back to ER with a cardiac event bp 85/55 03/10/16, the pharmacist wants to go over all the medications this pt is taking.   Pt has not taken this since he has been in hospital  Alirocumab (PRALUENT) 150 MG/ML SOPN Inject 1 pen into the skin every 14 (fourteen) days.

## 2016-03-11 NOTE — Progress Notes (Signed)
RN notified Dr Eliseo Squires that patient's heart rate in a fib 130s-140s. Patient asymptomatic, sitting up in bed eating dinner. Blood pressure 130/70. Metoprolol 5 mg IV administered as directed by Dr Eliseo Squires

## 2016-03-11 NOTE — Progress Notes (Signed)
[facility]  Active pt with [facility] [place] home on Monday, 03-09-16. [facility] RN made initial home visit on 03-10-16 for first home dose of IV Rocephin 2 Grams Q 24 hours.  RN unable to obtain BP and called cardiologist who advised to send pt to ED. [facility] team will follow pt during this admission to support transition  home when ordered.  If patient discharges after hours, please call [phone].   [name] 03/11/2016, 7:09 AM

## 2016-03-11 NOTE — Progress Notes (Signed)
PROGRESS NOTE    [name]  [id number] DOB: 24-Jul-1945 DOA: 03/10/2016 PCP: Sinclair Grooms, MD   Outpatient Specialists:     Brief Narrative:  Jonathan Holden is a 71 y.o. gentleman with a history of CAD S/P stent, HTN, HLD, prior MI, paroxysmal atrial fibrillation (CHADS-Vasc 3, anticoagulated with Eliquis), and recent admission from 2/2 - 2/9 for lumbar diskitis and streptococcal bacteremia followed by admission to acute rehab.  The patient was just discharged yesterday.  This afternoon, he developed chills and sweats but no fever.  He had light-headedness but no LOC.  No chest pain or palpitations.  [facility] RN was there to set up his home antibiotic therapy and reportedly could not consistent blood pressures.  Systolic blood pressure 80 or less.  He called the cardiology office and was advised to report to the ED for evaluation.  No nausea or vomiting.  No dysuria.  No diarrhea.  No new rashes.  He has cough intermittently productive of phlegm. Decreased appetite.  Low back pain has been stable.  Now walks with a walker at baseline.   Assessment & Plan:   Principal Problem:   Sepsis ([place]) Active Problems:   Streptococcal bacteremia   Lumbar discitis   Near syncope   Hypotension   Bacteremia due to lumbar discitis -continue rocephin as arranged outpatient through March -repeat blood cultures pending  Hypotension with near syncope -suspect overmedicated with metoprolol 100 mg -BP running low in rehab and patient had 1 reading of SBP in the 50s while getting up to go to the bathroom -echo pending  Chills -r/o flu with swab  Mild elevation of AST/ALT -consistent with previous numbers -? From hypotension -outpatient follow up  Mildly elevated troponin -troponin x 1 -suspect related to hypotension  AKI -resolved with IVF  Atrial fib -low dose metoprolol-- decreased from 100 to 12.5 mg -eliquis  DVT prophylaxis:  Fully anticoagulated   Code  Status: Full Code   Family Communication: patient  Disposition Plan:  Home in AM if blood cultures back and BP stable   Consultants:        Subjective: No SOB, no CP No further chills here   Objective: Vitals:   03/10/16 2354 03/11/16 0000 03/11/16 0055 03/11/16 0435  BP:  131/82 118/70 118/68  Pulse:  88 89 87  Resp:  15  18  Temp: 98.6 F (37 C)  98.3 F (36.8 C) 98.6 F (37 C)  TempSrc: Rectal  Oral Oral  SpO2:  99% 99% 97%  Weight:   87.6 kg (193 lb 1.6 oz)   Height:   6' 2.02" (1.88 m)     Intake/Output Summary (Last 24 hours) at 03/11/16 0953 Last data filed at 03/11/16 0435  Gross per 24 hour  Intake                0 ml  Output              300 ml  Net             -300 ml   Filed Weights   03/10/16 1657 03/11/16 0055  Weight: 88 kg (194 lb) 87.6 kg (193 lb 1.6 oz)    Examination:  General exam: Appears calm and comfortable  Respiratory system: Clear to auscultation. Respiratory effort normal. Cardiovascular system: S1 & S2 heard, RRR. No JVD, murmurs, rubs, gallops or clicks. No pedal edema. Gastrointestinal system: Abdomen is nondistended, soft and nontender. No organomegaly or masses  felt. Normal bowel sounds heard. Central nervous system: Alert and oriented. No focal neurological deficits.     Data Reviewed: I have personally reviewed following labs and imaging studies  CBC:  Recent Labs Lab 03/06/16 0554 03/10/16 1655 03/11/16 0420  WBC 8.6 12.2* 7.9  NEUTROABS  --  9.7*  --   HGB 11.7* 13.1 11.1*  HCT 35.1* 39.1 33.7*  MCV 94.6 94.7 94.4  PLT 382 403* [id number]   Basic Metabolic Panel:  Recent Labs Lab 03/06/16 0554 03/10/16 1655 03/11/16 0420  NA 131* 131* 135  K 4.6 5.1 4.1  CL 96* 95* 98*  CO2 25 25 25   GLUCOSE 124* 121* 120*  BUN 11 20 17   CREATININE 0.95 1.58* 0.95  CALCIUM 9.5 9.4 9.0   GFR: Estimated Creatinine Clearance: 82.9 mL/min (by C-G formula based on SCr of 0.95 mg/dL). Liver Function Tests:  Recent  Labs Lab 03/10/16 1655  AST 55*  ALT 86*  ALKPHOS 86  BILITOT 0.1*  PROT 7.5  ALBUMIN 2.6*   No results for input(s): LIPASE, AMYLASE in the last 168 hours. No results for input(s): AMMONIA in the last 168 hours. Coagulation Profile:  Recent Labs Lab 03/10/16 1655  INR 1.35   Cardiac Enzymes:  Recent Labs Lab 03/10/16 1757 03/11/16 0125 03/11/16 0420  TROPONINI <0.03 <0.03 0.03*   BNP (last 3 results) No results for input(s): PROBNP in the last 8760 hours. HbA1C: No results for input(s): HGBA1C in the last 72 hours. CBG: No results for input(s): GLUCAP in the last 168 hours. Lipid Profile: No results for input(s): CHOL, HDL, LDLCALC, TRIG, CHOLHDL, LDLDIRECT in the last 72 hours. Thyroid Function Tests:  Recent Labs  03/11/16 0125  TSH 1.148   Anemia Panel: No results for input(s): VITAMINB12, FOLATE, FERRITIN, TIBC, IRON, RETICCTPCT in the last 72 hours. Urine analysis:    Component Value Date/Time   COLORURINE AMBER (A) 03/10/2016 1856   APPEARANCEUR HAZY (A) 03/10/2016 1856   LABSPEC 1.023 03/10/2016 1856   PHURINE 5.0 03/10/2016 1856   GLUCOSEU NEGATIVE 03/10/2016 1856   HGBUR NEGATIVE 03/10/2016 1856   BILIRUBINUR NEGATIVE 03/10/2016 1856   KETONESUR NEGATIVE 03/10/2016 1856   PROTEINUR 30 (A) 03/10/2016 1856   NITRITE NEGATIVE 03/10/2016 1856   LEUKOCYTESUR NEGATIVE 03/10/2016 1856     )No results found for this or any previous visit (from the past 240 hour(s)).    Anti-infectives    Start     Dose/Rate Route Frequency Ordered Stop   03/11/16 0200  cefTRIAXone (ROCEPHIN) 2 g in dextrose 5 % 50 mL IVPB     2 g 100 mL/hr over 30 Minutes Intravenous Every 24 hours 03/11/16 0052         Radiology Studies: Dg Chest Portable 1 View  Result Date: 03/10/2016 CLINICAL DATA:  Sepsis. EXAM: PORTABLE CHEST 1 VIEW COMPARISON:  Chest radiograph February 27, 2016 FINDINGS: RIGHT peripherally inserted central venous catheter looped at the  brachiocephalic confluence, distal tip projecting in proximal superior vena cava. Cardiac silhouette is mildly enlarged and unchanged. Biapical pleural thickening. Mediastinal silhouette is nonsuspicious. No pleural effusion or focal consolidation. No pneumothorax. Moderate degenerative change of the RIGHT shoulder. IMPRESSION: Stable cardiomegaly, no acute pulmonary process. Retracted PICC distal tip now projects in proximal superior vena cava. Electronically Signed   By: Elon Alas M.D.   On: 03/10/2016 18:30        Scheduled Meds: . apixaban  5 mg Oral BID  . atorvastatin  40 mg Oral  [id number]  . cefTRIAXone (ROCEPHIN)  IV  2 g Intravenous Q24H  . folic acid  1 mg Oral Daily  . lidocaine  1 patch Transdermal QHS  . metoprolol  12.5 mg Oral BID  . multivitamin with minerals  1 tablet Oral Daily  . pantoprazole  40 mg Oral BID  . prednisoLONE acetate  1 drop Left Eye Daily  . sodium chloride flush  3 mL Intravenous Q12H  . thiamine  100 mg Oral Daily   Continuous Infusions: . sodium chloride 100 mL/hr (03/11/16 0125)     LOS: 0 days    Time spent: 25 min    [place], DO [facility] Pager [phone]  If 7PM-7AM, please contact night-coverage [email or web address] Password [email or web address] 03/11/2016, 9:53 AM

## 2016-03-11 NOTE — Progress Notes (Addendum)
1045: Patient stated that he did not want to take his medications until his observation status was adressed, as patient stated "i should be an inpatient." Rn explained the importance of taking medication on time and patient stated that he understood but that he still did not want to take them. Dr Eliseo Squires notified and instructed RN to inform case management. Case management notified and stated that patient's chart would have to be reviewed and she did not know when that would take place. Patient informed of this and stated that he would "wait on taking my medications until I hear something."  1345: Dr Eliseo Squires notified RN that patient has met inpatient criteria. Patient informed of this. Patient agreeable to take medications.

## 2016-03-11 NOTE — Progress Notes (Signed)
Telemetry notified RN around [phone] that around 1500 patient had converted from NSR to a-flutter and then into a-fib. RN assessed telemetetry rhythm strip that did show a-fib with a rate from 105-120. Patient asymptomatic upon assessment and patient voiced no complaints, stating that he felt "fine." BP 127/75. EKG obtained. Dr Eliseo Squires notified and stated that she would increase patient's metoprolol dose for tonight.

## 2016-03-11 NOTE — Progress Notes (Signed)
*  PRELIMINARY RESULTS* Echocardiogram 2D Echocardiogram has been performed.  Samuel Germany 03/11/2016, 1:46 PM

## 2016-03-12 LAB — GLUCOSE, CAPILLARY: Glucose-Capillary: 97 mg/dL (ref 65–99)

## 2016-03-12 MED ORDER — METOPROLOL TARTRATE 25 MG PO TABS: 25.0000 mg | ORAL_TABLET | Freq: Two times a day (BID) | ORAL | 0 refills | Status: DC

## 2016-03-12 MED ORDER — HEPARIN SOD (PORK) LOCK FLUSH 100 UNIT/ML IV SOLN
250.0000 [IU] | INTRAVENOUS | Status: AC | PRN
  Administered 2016-03-12: 250 [IU]

## 2016-03-12 NOTE — Discharge Summary (Signed)
Physician Discharge Summary  [name] CVE:[id number] DOB: [birth year] DOA: 03/10/2016  PCP: Sinclair Grooms, MD  Admit date: 03/10/2016 Discharge date: 03/12/2016   Recommendations for Outpatient Follow-Up:   1. Resume home health 2. Continue IV abx 3. Metoprolol dose has been decreased to 25 mg BID-may need further adjustment as outpatient 4. Trend AST./ALT   Discharge Diagnosis:   Principal Problem:   Sepsis ([place]) Active Problems:   Streptococcal bacteremia   Lumbar discitis   Near syncope   Hypotension   Discharge disposition:  Home  Discharge Condition: Improved.  Diet recommendation: Low sodium, heart healthy.   Wound care: None.   History of Present Illness:   Jonathan Holden is a 71 y.o. gentleman with a history of CAD S/P stent, HTN, HLD, prior MI, paroxysmal atrial fibrillation (CHADS-Vasc 3, anticoagulated with Eliquis), and recent admission from 2/2 - 2/9 for lumbar diskitis and streptococcal bacteremia followed by admission to acute rehab.  The patient was just discharged yesterday.  This afternoon, he developed chills and sweats but no fever.  He had light-headedness but no LOC.  No chest pain or palpitations.  [facility] RN was there to set up his home antibiotic therapy and reportedly could not consistent blood pressures.  Systolic blood pressure 80 or less.  He called the cardiology office and was advised to report to the ED for evaluation.  No nausea or vomiting.  No dysuria.  No diarrhea.  No new rashes.  He has cough intermittently productive of phlegm. Decreased appetite.  Low back pain has been stable.  Now walks with a walker at baseline.   Hospital Course by Problem:   Bacteremia due to lumbar discitis -continue rocephin as arranged outpatient through March -repeat blood cultures NGTD  Hypotension with near syncope -suspect overmedicated with metoprolol 100 mg-- adjusted to 25 mg BID -echo: Normal LV size with mild LV hypertrophy. EF  55%. Normal RV size   and systolic function. No significant valvular abnormalities.   Mild elevation of AST/ALT -consistent with previous numbers -? From hypotension -outpatient follow up  Mildly elevated troponin -troponin x 1 -suspect related to hypotension  AKI -resolved with IVF  Atrial fib -low dose metoprolol-- decreased from 100 to 25 mg -eliquis     Medical Consultants:    None.   Discharge Exam:   Vitals:   03/11/16 2015 03/12/16 0621  BP: 112/68 124/85  Pulse: 89 77  Resp: 18 18  Temp: 99 F (37.2 C) 97.7 F (36.5 C)   Vitals:   03/11/16 1300 03/11/16 1717 03/11/16 2015 03/12/16 0621  BP: 122/68 130/70 112/68 124/85  Pulse: (!) 102  89 77  Resp: 20  18 18   Temp: 97.6 F (36.4 C)  99 F (37.2 C) 97.7 F (36.5 C)  TempSrc: Oral  Oral Oral  SpO2: 99%  97% 96%  Weight:    86.6 kg (190 lb 14.4 oz)  Height:        Gen:  NAD    The results of significant diagnostics from this hospitalization (including imaging, microbiology, ancillary and laboratory) are listed below for reference.     Procedures and Diagnostic Studies:   Dg Chest Portable 1 View  Result Date: 03/10/2016 CLINICAL DATA:  Sepsis. EXAM: PORTABLE CHEST 1 VIEW COMPARISON:  Chest radiograph February 27, 2016 FINDINGS: RIGHT peripherally inserted central venous catheter looped at the brachiocephalic confluence, distal tip projecting in proximal superior vena cava. Cardiac silhouette is mildly enlarged and unchanged. Biapical  pleural thickening. Mediastinal silhouette is nonsuspicious. No pleural effusion or focal consolidation. No pneumothorax. Moderate degenerative change of the RIGHT shoulder. IMPRESSION: Stable cardiomegaly, no acute pulmonary process. Retracted PICC distal tip now projects in proximal superior vena cava. Electronically Signed   By: Elon Alas M.D.   On: 03/10/2016 18:30     Labs:   Basic Metabolic Panel:  Recent Labs Lab 03/06/16 0554  03/10/16 1655 03/11/16 0420  NA 131* 131* 135  K 4.6 5.1 4.1  CL 96* 95* 98*  CO2 25 25 25   GLUCOSE 124* 121* 120*  BUN 11 20 17   CREATININE 0.95 1.58* 0.95  CALCIUM 9.5 9.4 9.0   GFR Estimated Creatinine Clearance: 82.9 mL/min (by C-G formula based on SCr of 0.95 mg/dL). Liver Function Tests:  Recent Labs Lab 03/10/16 1655  AST 55*  ALT 86*  ALKPHOS 86  BILITOT 0.1*  PROT 7.5  ALBUMIN 2.6*   No results for input(s): LIPASE, AMYLASE in the last 168 hours. No results for input(s): AMMONIA in the last 168 hours. Coagulation profile  Recent Labs Lab 03/10/16 1655  INR 1.35    CBC:  Recent Labs Lab 03/06/16 0554 03/10/16 1655 03/11/16 0420  WBC 8.6 12.2* 7.9  NEUTROABS  --  9.7*  --   HGB 11.7* 13.1 11.1*  HCT 35.1* 39.1 33.7*  MCV 94.6 94.7 94.4  PLT 382 403* 308   Cardiac Enzymes:  Recent Labs Lab 03/10/16 1757 03/11/16 0125 03/11/16 0420 03/11/16 1452  TROPONINI <0.03 <0.03 0.03* <0.03   BNP: Invalid input(s): POCBNP CBG:  Recent Labs Lab 03/12/16 0616  GLUCAP 97   D-Dimer No results for input(s): DDIMER in the last 72 hours. Hgb A1c No results for input(s): HGBA1C in the last 72 hours. Lipid Profile No results for input(s): CHOL, HDL, LDLCALC, TRIG, CHOLHDL, LDLDIRECT in the last 72 hours. Thyroid function studies  Recent Labs  03/11/16 0125  TSH 1.148   Anemia work up No results for input(s): VITAMINB12, FOLATE, FERRITIN, TIBC, IRON, RETICCTPCT in the last 72 hours. Microbiology Recent Results (from the past 240 hour(s))  Culture, blood (Routine x 2)     Status: None (Preliminary result)   Collection Time: 03/10/16  4:55 PM  Result Value Ref Range Status   Specimen Description BLOOD PICC LINE  Final   Special Requests BOTTLES DRAWN AEROBIC AND ANAEROBIC 5CC  Final   Culture NO GROWTH < 24 HOURS  Final   Report Status PENDING  Incomplete  Culture, blood (Routine x 2)     Status: None (Preliminary result)   Collection Time:  03/10/16  6:08 PM  Result Value Ref Range Status   Specimen Description BLOOD LEFT ANTECUBITAL  Final   Special Requests BOTTLES DRAWN AEROBIC AND ANAEROBIC 5CC  Final   Culture NO GROWTH < 24 HOURS  Final   Report Status PENDING  Incomplete     Discharge Instructions:   Discharge Instructions    Diet - low sodium heart healthy    Complete by:  As directed    Discharge instructions    Complete by:  As directed    Resume home health- continue IV abx as previous prescribed Be sure to drink 6-8 glassed of water daily   Increase activity slowly    Complete by:  As directed      Allergies as of 03/12/2016      Reactions   Other Other (See Comments)   Pt reports allergic reaction to melons - unknown reaction  Ciprofloxacin Hives      Medication List    TAKE these medications   apixaban 5 MG Tabs tablet Commonly known as:  ELIQUIS Take 1 tablet (5 mg total) by mouth 2 (two) times daily.   atorvastatin 40 MG tablet Commonly known as:  LIPITOR Take 1 tablet (40 mg total) by mouth daily.   cefTRIAXone IVPB Commonly known as:  ROCEPHIN Inject 2 g into the vein daily. Indication:  Diskitis, bacteremia Last Day of Therapy:  04/05/16 Labs - Once weekly:  CBC/D and BMP, Labs - Every other week:  ESR and CRP   folic acid 1 MG tablet Commonly known as:  FOLVITE Take 1 tablet (1 mg total) by mouth daily.   guaiFENesin 600 MG 12 hr tablet Commonly known as:  MUCINEX Take 1 tablet (600 mg total) by mouth 2 (two) times daily.   lidocaine 5 % Commonly known as:  LIDODERM Apply to back at 11 pm and remove at 11 am daily.   Melatonin 3 MG Tabs Take 3 tablets (9 mg total) by mouth at bedtime as needed (sleep).   metoprolol tartrate 25 MG tablet Commonly known as:  LOPRESSOR Take 1 tablet (25 mg total) by mouth 2 (two) times daily. What changed:  medication strength  how much to take   multivitamin with minerals Tabs tablet Take 1 tablet by mouth daily. Centrum    oxyCODONE-acetaminophen 5-325 MG tablet Commonly known as:  PERCOCET/ROXICET Take 1 tablet by mouth every 8 (eight) hours as needed for severe pain.   pantoprazole 40 MG tablet Commonly known as:  PROTONIX Take 1 tablet (40 mg total) by mouth 2 (two) times daily.   PRALUENT 150 MG/ML Sopn Generic drug:  Alirocumab Inject 1 pen into the skin every 14 (fourteen) days.   prednisoLONE acetate 1 % ophthalmic suspension Commonly known as:  PRED FORTE Place 1 drop into the left eye daily.   thiamine 100 MG tablet Take 1 tablet (100 mg total) by mouth daily.         Time coordinating discharge: 34 min  Signed:  Dosha Broshears U Madalyn Legner   [facility] 03/12/2016, 8:55 AM

## 2016-03-12 NOTE — Care Management Note (Addendum)
Case Management Note Marvetta Gibbons RN, BSN Unit 2W-Case Manager [phone]  Patient Details  Name: Jonathan Holden MRN: [id number] Date of Birth: April 04, 1945  Subjective/Objective:   Pt presented with hypotension and sepsis                Action/Plan: PTA pt lived at home with wife- recent d/c from [facility]- pt was active with [facility] for HHRN/PT/OT/SP and for home IV abx- will need resumption order for [facility] services at d/c to resume IV abx and HHRN/PT/OT/SP services at home.   Expected Discharge Date:  03/12/16               Expected Discharge Plan:  [place]  In-House Referral:     Discharge planning Services  CM Consult  Post Acute Care Choice:  Home Health, Resumption of Svcs/PTA Provider Choice offered to:  Patient, Spouse  DME Arranged:   NA DME Agency:     HH Arranged:  RN, IV Antibiotics, PT/OT/SP HH Agency:  [place]  Status of Service:  Completed, signed off  If discussed at [place] of Stay Meetings, dates discussed:    Discharge Disposition: home with home health  Additional Comments:  03/12/16- 1000- Marvetta Gibbons RN, CM- pt for d/c home today - order to resume [facility] services and for IV abx has been placed- spoke with both Santiago Glad and Pam with [facility] for [facility] services resumption- per Pam she does not need to see pt prior to d/c- [facility] will see pt at home to resume IV abx needs along with HHRN/PT/OT/SP. Spoke with pt at bedside to share that [facility] services needs had been taken care of.   Dawayne Patricia, RN 03/12/2016, 10:03 AM

## 2016-03-12 NOTE — Progress Notes (Signed)
Order received to discharge patient.  Telemetry monitor removed and CCMD notified.  Discharge instructions, follow up, medications and instructions for their use were discussed with patient and family.  Central line was deaccessed and capped for home use by IV team.

## 2016-03-12 NOTE — Progress Notes (Signed)
[facility]  Patient Status: Active (receiving services up to time of hospitalization)  [facility] is providing the following services: RN, PT, OT, ST and Home Infusion Services (teaching and education will be done by nurse in the home with patient and caregiver)  If patient discharges after hours, please call [phone].   Jonathan Holden 03/12/2016, 9:56 AM

## 2016-03-13 DIAGNOSIS — R7881 Bacteremia: Secondary | ICD-10-CM | POA: Diagnosis not present

## 2016-03-13 DIAGNOSIS — M4646 Discitis, unspecified, lumbar region: Secondary | ICD-10-CM | POA: Diagnosis not present

## 2016-03-13 DIAGNOSIS — B955 Unspecified streptococcus as the cause of diseases classified elsewhere: Secondary | ICD-10-CM | POA: Diagnosis not present

## 2016-03-13 DIAGNOSIS — G9340 Encephalopathy, unspecified: Secondary | ICD-10-CM | POA: Diagnosis not present

## 2016-03-13 DIAGNOSIS — Z452 Encounter for adjustment and management of vascular access device: Secondary | ICD-10-CM | POA: Diagnosis not present

## 2016-03-13 DIAGNOSIS — I2510 Atherosclerotic heart disease of native coronary artery without angina pectoris: Secondary | ICD-10-CM | POA: Diagnosis not present

## 2016-03-13 NOTE — Telephone Encounter (Signed)
Please be sure that when he is seen in [place], it is known that we should be working towards electrical cardioversion. After he is on continuous anticoagulation therapy for 3 weeks, we should start amiodarone prior to cardioversion. I need to see him prior to having cardioversion set up.

## 2016-03-13 NOTE — Telephone Encounter (Signed)
Will route to Bonney Leitz, PA-C and covering RMA to make them aware as Valetta Fuller is seeing pt on 03/18/16. Also placed message in appt comment section.

## 2016-03-14 ENCOUNTER — Encounter (HOSPITAL_COMMUNITY): Payer: Self-pay | Admitting: *Deleted

## 2016-03-14 ENCOUNTER — Emergency Department (HOSPITAL_COMMUNITY)
Admission: EM | Admit: 2016-03-14 | Discharge: 2016-03-15 | Disposition: A | Discharge status: Home / Self Care | Payer: Medicare Other | Source: Home / Self Care | Attending: Emergency Medicine | Admitting: Emergency Medicine

## 2016-03-14 DIAGNOSIS — D649 Anemia, unspecified: Secondary | ICD-10-CM | POA: Diagnosis not present

## 2016-03-14 DIAGNOSIS — I4819 Other persistent atrial fibrillation: Secondary | ICD-10-CM

## 2016-03-14 DIAGNOSIS — I2510 Atherosclerotic heart disease of native coronary artery without angina pectoris: Secondary | ICD-10-CM

## 2016-03-14 DIAGNOSIS — K922 Gastrointestinal hemorrhage, unspecified: Secondary | ICD-10-CM | POA: Diagnosis not present

## 2016-03-14 DIAGNOSIS — I481 Persistent atrial fibrillation: Secondary | ICD-10-CM | POA: Insufficient documentation

## 2016-03-14 DIAGNOSIS — D62 Acute posthemorrhagic anemia: Secondary | ICD-10-CM | POA: Diagnosis not present

## 2016-03-14 DIAGNOSIS — I38 Endocarditis, valve unspecified: Secondary | ICD-10-CM | POA: Diagnosis not present

## 2016-03-14 DIAGNOSIS — Z87891 Personal history of nicotine dependence: Secondary | ICD-10-CM | POA: Insufficient documentation

## 2016-03-14 DIAGNOSIS — I951 Orthostatic hypotension: Secondary | ICD-10-CM

## 2016-03-14 DIAGNOSIS — I10 Essential (primary) hypertension: Secondary | ICD-10-CM | POA: Diagnosis not present

## 2016-03-14 DIAGNOSIS — K5791 Diverticulosis of intestine, part unspecified, without perforation or abscess with bleeding: Secondary | ICD-10-CM | POA: Diagnosis not present

## 2016-03-14 DIAGNOSIS — I480 Paroxysmal atrial fibrillation: Secondary | ICD-10-CM | POA: Diagnosis not present

## 2016-03-14 DIAGNOSIS — Z7901 Long term (current) use of anticoagulants: Secondary | ICD-10-CM

## 2016-03-14 DIAGNOSIS — R55 Syncope and collapse: Secondary | ICD-10-CM | POA: Diagnosis not present

## 2016-03-14 DIAGNOSIS — E785 Hyperlipidemia, unspecified: Secondary | ICD-10-CM | POA: Diagnosis not present

## 2016-03-14 DIAGNOSIS — K625 Hemorrhage of anus and rectum: Secondary | ICD-10-CM | POA: Diagnosis not present

## 2016-03-14 DIAGNOSIS — Z955 Presence of coronary angioplasty implant and graft: Secondary | ICD-10-CM | POA: Insufficient documentation

## 2016-03-14 LAB — I-STAT CHEM 8, ED
BUN: 15 mg/dL (ref 6–20)
CHLORIDE: 97 mmol/L — AB (ref 101–111)
Calcium, Ion: 1.2 mmol/L (ref 1.15–1.40)
Creatinine, Ser: 1 mg/dL (ref 0.61–1.24)
GLUCOSE: 116 mg/dL — AB (ref 65–99)
HEMATOCRIT: 37 % — AB (ref 39.0–52.0)
HEMOGLOBIN: 12.6 g/dL — AB (ref 13.0–17.0)
POTASSIUM: 4.3 mmol/L (ref 3.5–5.1)
SODIUM: 134 mmol/L — AB (ref 135–145)
TCO2: 25 mmol/L (ref 0–100)

## 2016-03-14 LAB — I-STAT CG4 LACTIC ACID, ED: LACTIC ACID, VENOUS: 1.11 mmol/L (ref 0.5–1.9)

## 2016-03-14 NOTE — ED Triage Notes (Signed)
Pt presents with hypotension. Reports feeling lightheaded with blurred vision throughout the day. Pt was recently admitted and discharged 2 days ago for similar symptoms. Pt has a PICC to R arm.

## 2016-03-14 NOTE — ED Provider Notes (Signed)
[place] DEPT Provider Note   CSN: [id number] Arrival date & time: 03/14/16  2236  By signing my name below, I, Gwenlyn Fudge, attest that this documentation has been prepared under the direction and in the presence of Ripley Fraise, MD. Electronically Signed: Gwenlyn Fudge, ED Scribe. 03/14/16. 11:36 PM.   History   Chief Complaint Chief Complaint  Patient presents with  . Hypotension   The history is provided by the patient. No language interpreter was used.   HPI Comments: Jonathan Holden is a 71 y.o. male with PMHx of A-fib, CAD, MI, HLD and HTN who presents to the Emergency Department complaining of gradual onset, intermittent hypotension beginning today. Pt reports associated lightheadedness, fatigue and blurry vision. His blood pressure earlier today was measured at 154/134 before falling to 86/60 following a nap and his evening medications. Pt was discharged 2 days ago from the hospital for similar symptoms. His Metoprolol dosage was decreased recently. Pt takes Lopressor x2 daily. He is currently on blood thinners. He states he is compliant with all of his medications as prescribed. He is receiving IV antibiotics for bacterial infection that has resolved, but he has not completed his course of antibiotics. Pt denies fever, blood loss, vomiting, chest pain, shortness of breath, diarrhea, LOC.   Past Medical History:  Diagnosis Date  . Arthritis HANDS AND RIGHT SHOULDER  . Coronary artery disease CARDIOLOGIST- DR Daneen Schick- LAST VISIT 6 MON AGO--  REQUESTED NOTE, EKG, ECHO, STRESS TEST   PT DENIES S & S  . H/O gastric ulcer   . History of MI (myocardial infarction) 1992  . Hyperlipidemia   . Hypertension   . Loose body of right shoulder   . S/P coronary angioplasty     Patient Active Problem List   Diagnosis Date Noted  . Hypotension 03/11/2016  . Near syncope 03/10/2016  . Gastroesophageal reflux disease   . Acute blood loss anemia   . Hypoalbuminemia due to  protein-calorie malnutrition ([place])   . Benign essential HTN   . New onset a-fib ([place])   . Bacteremia   . Pulmonary hypertension   . Low back pain   . Ileus ([place])   . Endocarditis of native valve   . Infection of intervertebral disc ([place])   . Streptococcal bacteremia   . Lumbar discitis   . Sepsis ([place]) 02/21/2016  . Hyponatremia 02/21/2016  . Atrial fibrillation with RVR ([place]) 02/21/2016  . Back pain 02/21/2016  . Acute encephalopathy 02/21/2016  . CAD (coronary artery disease), native coronary artery 09/19/2014  . Essential hypertension 09/19/2014  . Hyperlipidemia 10/03/2013    Past Surgical History:  Procedure Laterality Date  . CARDIOVASCULAR STRESS TEST  08-22-2008---  DR Daneen Schick   NO ISCHEMIA.   Marland Kitchen CORONARY ANGIOPLASTY  1992  . ERCP W/ SPHICTEROTOMY  12-15-2002   AND STONE EXTRACTION  . LAPAROSCOPIC CHOLECYSTECTOMY  12-16-2002  . LUMBAR LAMINECTOMY  20 YRS AGO   L5  . TEE WITHOUT CARDIOVERSION N/A 02/28/2016   Procedure: TRANSESOPHAGEAL ECHOCARDIOGRAM (TEE);  Surgeon: Fay Records, MD;  [facility] ENDOSCOPY;  Service: Cardiovascular;  Laterality: N/A;       Home Medications    Prior to Admission medications   Medication Sig Start Date End Date Taking? Authorizing Provider  Alirocumab (PRALUENT) 150 MG/ML SOPN Inject 1 pen into the skin every 14 (fourteen) days.    Historical Provider, MD  apixaban (ELIQUIS) 5 MG TABS tablet Take 1 tablet (5 mg total) by mouth 2 (two)  times daily. 03/09/16   Bary Leriche, PA-C  atorvastatin (LIPITOR) 40 MG tablet Take 1 tablet (40 mg total) by mouth daily. 02/29/16   Allie Bossier, MD  cefTRIAXone (ROCEPHIN) IVPB Inject 2 g into the vein daily. Indication:  Diskitis, bacteremia Last Day of Therapy:  04/05/16 Labs - Once weekly:  CBC/D and BMP, Labs - Every other week:  ESR and CRP 03/09/16 04/05/16  Ivan Anchors Love, PA-C  folic acid (FOLVITE) 1 MG tablet Take 1 tablet (1 mg total) by mouth daily. 03/09/16   Bary Leriche, PA-C    guaiFENesin (MUCINEX) 600 MG 12 hr tablet Take 1 tablet (600 mg total) by mouth 2 (two) times daily. 03/09/16   Ivan Anchors Love, PA-C  lidocaine (LIDODERM) 5 % Apply to back at 11 pm and remove at 11 am daily. 03/09/16   Bary Leriche, PA-C  Melatonin 3 MG TABS Take 3 tablets (9 mg total) by mouth at bedtime as needed (sleep). 02/28/16   Allie Bossier, MD  metoprolol tartrate (LOPRESSOR) 25 MG tablet Take 1 tablet (25 mg total) by mouth 2 (two) times daily. 03/12/16   Geradine Girt, DO  Multiple Vitamin (MULTIVITAMIN WITH MINERALS) TABS tablet Take 1 tablet by mouth daily. Centrum    Historical Provider, MD  oxyCODONE-acetaminophen (PERCOCET/ROXICET) 5-325 MG tablet Take 1 tablet by mouth every 8 (eight) hours as needed for severe pain. 03/09/16   Ivan Anchors Love, PA-C  pantoprazole (PROTONIX) 40 MG tablet Take 1 tablet (40 mg total) by mouth 2 (two) times daily. 03/09/16   Ivan Anchors Love, PA-C  prednisoLONE acetate (PRED FORTE) 1 % ophthalmic suspension Place 1 drop into the left eye daily. 03/09/16   Bary Leriche, PA-C  thiamine 100 MG tablet Take 1 tablet (100 mg total) by mouth daily. 02/29/16   Allie Bossier, MD    Family History Family History  Problem Relation Age of Onset  . Other Mother     AGE 45 HEALTHY  . Heart attack Father 51    2 MI  . Emphysema    . Other Brother     HEALTHY    Social History Social History  Substance Use Topics  . Smoking status: Former Smoker    Packs/day: 2.00    Years: 20.00    Types: Cigarettes    Quit date: 03/27/1990  . Smokeless tobacco: Never Used  . Alcohol use 4.2 oz/week    7 Glasses of wine per week     Allergies   Other and Ciprofloxacin   Review of Systems Review of Systems  Constitutional: Positive for fatigue. Negative for fever.  Eyes: Positive for visual disturbance.  Respiratory: Negative for shortness of breath.   Cardiovascular: Negative for chest pain.  Gastrointestinal: Negative for diarrhea and vomiting.  Neurological:  Positive for light-headedness. Negative for syncope.  All other systems reviewed and are negative.  Physical Exam Updated Vital Signs BP (!) 86/65 (BP Location: Left Arm)   Pulse 68   Temp 98.3 F (36.8 C) (Oral)   Resp 19   SpO2 97%   Physical Exam CONSTITUTIONAL: Well developed/well nourished HEAD: Normocephalic/atraumatic EYES: EOMI/PERRL ENMT: Mucous membranes moist NECK: supple no meningeal signs SPINE/BACK:entire spine nontender CV: irregular, no murmurs/rubs/gallops LUNGS: Lungs are clear to auscultation bilaterally, no apparent distress ABDOMEN: soft, nontender, no rebound or guarding, bowel sounds noted throughout abdomen GU:no cva tenderness NEURO: Pt is awake/alert/appropriate, moves all extremitiesx4.  No facial droop.   EXTREMITIES: pulses  normal/equal, full ROM, PICC Line to right arm SKIN: warm, color normal PSYCH: no abnormalities of mood noted, alert and oriented to situation   ED Treatments / Results  DIAGNOSTIC STUDIES: Oxygen Saturation is 97% on RA, normal by my interpretation.    COORDINATION OF CARE: 11:17 PM Discussed treatment plan with pt at bedside which includes orthostatic vitals and observation and pt agreed to plan.  Labs (all labs ordered are listed, but only abnormal results are displayed) Labs Reviewed  I-STAT CHEM 8, ED - Abnormal; Notable for the following:       Result Value   Sodium 134 (*)    Chloride 97 (*)    Glucose, Bld 116 (*)    Hemoglobin 12.6 (*)    HCT 37.0 (*)    All other components within normal limits  I-STAT CG4 LACTIC ACID, ED    EKG  EKG Interpretation  Date/Time:  Saturday March 14 2016 23:40:55 EST Ventricular Rate:  114 PR Interval:    QRS Duration: 100 QT Interval:  344 QTC Calculation: 474 R Axis:   25 Text Interpretation:  Atrial fibrillation Paired ventricular premature complexes Borderline low voltage, extremity leads No significant change since last tracing Confirmed by Christy Gentles  MD, Jakyrie Totherow  [phone]) on 03/15/2016 12:08:35 AM       Radiology No results found.  Procedures Procedures (including critical care time)  Medications Ordered in ED Medications  sodium chloride 0.9 % bolus 1,000 mL (0 mLs Intravenous Stopped 03/15/16 0310)     Initial Impression / Assessment and Plan / ED Course  I have reviewed the triage vital signs and the nursing notes.  Pertinent labs  results that were available during my care of the patient were reviewed by me and considered in my medical decision making (see chart for details).     3:38 AM Pt with recent complicated course He has known diskitis, but improved with home IV rocephin He also has recent new diagnosis of afib, on DOAC He just got out of hospital for hypotension that was likely due to high dose of lopressor This was scaled back to lopressor 25BID   No fever or infectious symptoms reported Lactate normal  After 1LNS he is improved He did not have any dizziness upon standing  I suspect this may be related to his lopressor Will scale back to 12.5 BID He does not appear septic Labs reassuring He has cardiology f/u on 2/28 per patient  We discussed strict return precautions   I personally performed the services described in this documentation, which was scribed in my presence. The recorded information has been reviewed and is accurate.        Final Clinical Impressions(s) / ED Diagnoses   Final diagnoses:  Orthostatic hypotension  Persistent atrial fibrillation (HCC)    New Prescriptions New Prescriptions   METOPROLOL (LOPRESSOR) 25 MG TABLET    Take 0.5 tablets (12.5 mg total) by mouth 2 (two) times daily.     Ripley Fraise, MD 03/15/16 [phone]

## 2016-03-15 LAB — CULTURE, BLOOD (ROUTINE X 2)
CULTURE: NO GROWTH
Culture: NO GROWTH

## 2016-03-15 MED ORDER — SODIUM CHLORIDE 0.9 % IV BOLUS (SEPSIS)
1000.0000 mL | Freq: Once | INTRAVENOUS | Status: AC
  Administered 2016-03-15: 1000 mL via INTRAVENOUS

## 2016-03-15 MED ORDER — METOPROLOL TARTRATE 25 MG PO TABS: 12.5000 mg | ORAL_TABLET | Freq: Two times a day (BID) | ORAL | 0 refills | Status: DC

## 2016-03-16 ENCOUNTER — Telehealth: Payer: Self-pay | Admitting: Interventional Cardiology

## 2016-03-16 DIAGNOSIS — B955 Unspecified streptococcus as the cause of diseases classified elsewhere: Secondary | ICD-10-CM | POA: Diagnosis not present

## 2016-03-16 DIAGNOSIS — M4646 Discitis, unspecified, lumbar region: Secondary | ICD-10-CM | POA: Diagnosis not present

## 2016-03-16 DIAGNOSIS — R7881 Bacteremia: Secondary | ICD-10-CM | POA: Diagnosis not present

## 2016-03-16 DIAGNOSIS — Z452 Encounter for adjustment and management of vascular access device: Secondary | ICD-10-CM | POA: Diagnosis not present

## 2016-03-16 DIAGNOSIS — I2510 Atherosclerotic heart disease of native coronary artery without angina pectoris: Secondary | ICD-10-CM | POA: Diagnosis not present

## 2016-03-16 DIAGNOSIS — G9340 Encephalopathy, unspecified: Secondary | ICD-10-CM | POA: Diagnosis not present

## 2016-03-16 NOTE — Telephone Encounter (Signed)
Pt's girlfriend Bettye Boeck calling regarding pt being in the hospital 03-14-16. Sunday pt had bloody diarrhea , and today had harder stool but still bloody-BP 100/60 this am-Mollie called [facility] and was told to call our office to get advice-he has appt with App 03-18-16- no DPR -or Release on file-Kim in medical records to email forms-Mollie to have pt sign and will fax back--pls call [phone]-pt at the home-won't answer phone but may give verbal ok

## 2016-03-16 NOTE — Telephone Encounter (Signed)
Returned call.  Patient gave verbal consent to discuss his health issues with Bettye Boeck.  She was on the phone with [place].  Patient is concerned that he has been in the hospital for the last month and feels like everything was addressed but his cardiac issues.  Patient non-specific on cardiac issues. Per the hospital notes cardiac issues were addressed during his stay. He has an appt with [place] 03-18-16 with an extender and an appt with Dr Tamala Julian on 03-30-16.

## 2016-03-17 ENCOUNTER — Encounter (HOSPITAL_COMMUNITY): Payer: Self-pay

## 2016-03-17 ENCOUNTER — Inpatient Hospital Stay (HOSPITAL_COMMUNITY)
Admission: EM | Admit: 2016-03-17 | Discharge: 2016-03-24 | DRG: 378 | Disposition: A | Discharge status: Home Health | Payer: Medicare Other | Attending: Internal Medicine | Admitting: Internal Medicine

## 2016-03-17 ENCOUNTER — Ambulatory Visit: Payer: Medicare Other | Admitting: Nurse Practitioner

## 2016-03-17 DIAGNOSIS — Z9861 Coronary angioplasty status: Secondary | ICD-10-CM

## 2016-03-17 DIAGNOSIS — I4891 Unspecified atrial fibrillation: Secondary | ICD-10-CM | POA: Diagnosis not present

## 2016-03-17 DIAGNOSIS — K219 Gastro-esophageal reflux disease without esophagitis: Secondary | ICD-10-CM | POA: Diagnosis present

## 2016-03-17 DIAGNOSIS — Z7901 Long term (current) use of anticoagulants: Secondary | ICD-10-CM

## 2016-03-17 DIAGNOSIS — B955 Unspecified streptococcus as the cause of diseases classified elsewhere: Secondary | ICD-10-CM | POA: Diagnosis not present

## 2016-03-17 DIAGNOSIS — Z452 Encounter for adjustment and management of vascular access device: Secondary | ICD-10-CM | POA: Diagnosis not present

## 2016-03-17 DIAGNOSIS — D649 Anemia, unspecified: Secondary | ICD-10-CM | POA: Diagnosis not present

## 2016-03-17 DIAGNOSIS — I252 Old myocardial infarction: Secondary | ICD-10-CM

## 2016-03-17 DIAGNOSIS — K5791 Diverticulosis of intestine, part unspecified, without perforation or abscess with bleeding: Secondary | ICD-10-CM | POA: Diagnosis not present

## 2016-03-17 DIAGNOSIS — Z825 Family history of asthma and other chronic lower respiratory diseases: Secondary | ICD-10-CM

## 2016-03-17 DIAGNOSIS — Z87891 Personal history of nicotine dependence: Secondary | ICD-10-CM | POA: Diagnosis not present

## 2016-03-17 DIAGNOSIS — K922 Gastrointestinal hemorrhage, unspecified: Secondary | ICD-10-CM | POA: Diagnosis present

## 2016-03-17 DIAGNOSIS — M25461 Effusion, right knee: Secondary | ICD-10-CM | POA: Diagnosis present

## 2016-03-17 DIAGNOSIS — R52 Pain, unspecified: Secondary | ICD-10-CM

## 2016-03-17 DIAGNOSIS — G8929 Other chronic pain: Secondary | ICD-10-CM | POA: Diagnosis present

## 2016-03-17 DIAGNOSIS — Z8711 Personal history of peptic ulcer disease: Secondary | ICD-10-CM

## 2016-03-17 DIAGNOSIS — D509 Iron deficiency anemia, unspecified: Secondary | ICD-10-CM | POA: Diagnosis not present

## 2016-03-17 DIAGNOSIS — I10 Essential (primary) hypertension: Secondary | ICD-10-CM | POA: Diagnosis not present

## 2016-03-17 DIAGNOSIS — M25561 Pain in right knee: Secondary | ICD-10-CM | POA: Diagnosis present

## 2016-03-17 DIAGNOSIS — Z8249 Family history of ischemic heart disease and other diseases of the circulatory system: Secondary | ICD-10-CM | POA: Diagnosis not present

## 2016-03-17 DIAGNOSIS — I25119 Atherosclerotic heart disease of native coronary artery with unspecified angina pectoris: Secondary | ICD-10-CM | POA: Diagnosis present

## 2016-03-17 DIAGNOSIS — D62 Acute posthemorrhagic anemia: Secondary | ICD-10-CM | POA: Diagnosis present

## 2016-03-17 DIAGNOSIS — R7881 Bacteremia: Secondary | ICD-10-CM | POA: Diagnosis not present

## 2016-03-17 DIAGNOSIS — I480 Paroxysmal atrial fibrillation: Secondary | ICD-10-CM | POA: Diagnosis not present

## 2016-03-17 DIAGNOSIS — I38 Endocarditis, valve unspecified: Secondary | ICD-10-CM | POA: Diagnosis present

## 2016-03-17 DIAGNOSIS — I2510 Atherosclerotic heart disease of native coronary artery without angina pectoris: Secondary | ICD-10-CM | POA: Diagnosis present

## 2016-03-17 DIAGNOSIS — A419 Sepsis, unspecified organism: Secondary | ICD-10-CM | POA: Diagnosis not present

## 2016-03-17 DIAGNOSIS — G9340 Encephalopathy, unspecified: Secondary | ICD-10-CM | POA: Diagnosis not present

## 2016-03-17 DIAGNOSIS — R55 Syncope and collapse: Secondary | ICD-10-CM | POA: Diagnosis not present

## 2016-03-17 DIAGNOSIS — E785 Hyperlipidemia, unspecified: Secondary | ICD-10-CM | POA: Diagnosis not present

## 2016-03-17 DIAGNOSIS — Z79899 Other long term (current) drug therapy: Secondary | ICD-10-CM | POA: Diagnosis not present

## 2016-03-17 DIAGNOSIS — M4646 Discitis, unspecified, lumbar region: Secondary | ICD-10-CM | POA: Diagnosis not present

## 2016-03-17 DIAGNOSIS — R509 Fever, unspecified: Secondary | ICD-10-CM | POA: Diagnosis not present

## 2016-03-17 DIAGNOSIS — K625 Hemorrhage of anus and rectum: Secondary | ICD-10-CM | POA: Diagnosis not present

## 2016-03-17 LAB — COMPREHENSIVE METABOLIC PANEL
ALK PHOS: 71 U/L (ref 38–126)
ALT: 26 U/L (ref 17–63)
AST: 16 U/L (ref 15–41)
Albumin: 2.4 g/dL — ABNORMAL LOW (ref 3.5–5.0)
Anion gap: 5 (ref 5–15)
BUN: 21 mg/dL — ABNORMAL HIGH (ref 6–20)
CALCIUM: 7.9 mg/dL — AB (ref 8.9–10.3)
CHLORIDE: 108 mmol/L (ref 101–111)
CO2: 22 mmol/L (ref 22–32)
CREATININE: 0.79 mg/dL (ref 0.61–1.24)
Glucose, Bld: 170 mg/dL — ABNORMAL HIGH (ref 65–99)
Potassium: 4.2 mmol/L (ref 3.5–5.1)
Sodium: 135 mmol/L (ref 135–145)
Total Bilirubin: 0.4 mg/dL (ref 0.3–1.2)
Total Protein: 5.6 g/dL — ABNORMAL LOW (ref 6.5–8.1)

## 2016-03-17 LAB — POC OCCULT BLOOD, ED: Fecal Occult Bld: POSITIVE — AB

## 2016-03-17 LAB — CBC
HCT: 21.2 % — ABNORMAL LOW (ref 39.0–52.0)
Hemoglobin: 7.1 g/dL — ABNORMAL LOW (ref 13.0–17.0)
MCH: 30.3 pg (ref 26.0–34.0)
MCHC: 33.5 g/dL (ref 30.0–36.0)
MCV: 90.6 fL (ref 78.0–100.0)
PLATELETS: 244 10*3/uL (ref 150–400)
RBC: 2.34 MIL/uL — AB (ref 4.22–5.81)
RDW: 13.6 % (ref 11.5–15.5)
WBC: 11.1 10*3/uL — AB (ref 4.0–10.5)

## 2016-03-17 LAB — ABO/RH: ABO/RH(D): B POS

## 2016-03-17 LAB — MRSA PCR SCREENING: MRSA BY PCR: NEGATIVE

## 2016-03-17 LAB — PROTIME-INR
INR: 1.46
Prothrombin Time: 17.9 seconds — ABNORMAL HIGH (ref 11.4–15.2)

## 2016-03-17 LAB — I-STAT CG4 LACTIC ACID, ED: Lactic Acid, Venous: 1.02 mmol/L (ref 0.5–1.9)

## 2016-03-17 LAB — HEMOGLOBIN AND HEMATOCRIT, BLOOD
HEMATOCRIT: 20.9 % — AB (ref 39.0–52.0)
Hemoglobin: 7.1 g/dL — ABNORMAL LOW (ref 13.0–17.0)

## 2016-03-17 LAB — PREPARE RBC (CROSSMATCH)

## 2016-03-17 MED ORDER — PANTOPRAZOLE SODIUM 40 MG IV SOLR
40.0000 mg | Freq: Two times a day (BID) | INTRAVENOUS | Status: DC
  Administered 2016-03-21 – 2016-03-24 (×7): 40 mg via INTRAVENOUS
  Filled 2016-03-17 (×3): qty 40

## 2016-03-17 MED ORDER — SODIUM CHLORIDE 0.9 % IV SOLN
8.0000 mg/h | INTRAVENOUS | Status: AC
  Administered 2016-03-17 – 2016-03-19 (×9): 8 mg/h via INTRAVENOUS
  Filled 2016-03-17 (×15): qty 80

## 2016-03-17 MED ORDER — SODIUM CHLORIDE 0.9 % IV BOLUS (SEPSIS)
1000.0000 mL | Freq: Once | INTRAVENOUS | Status: AC
  Administered 2016-03-17: 1000 mL via INTRAVENOUS

## 2016-03-17 MED ORDER — ONDANSETRON HCL 4 MG PO TABS: 4.0000 mg | ORAL_TABLET | Freq: Four times a day (QID) | ORAL | Status: DC | PRN

## 2016-03-17 MED ORDER — ONDANSETRON HCL 4 MG/2ML IJ SOLN: 4.0000 mg | Freq: Four times a day (QID) | INTRAMUSCULAR | Status: DC | PRN

## 2016-03-17 MED ORDER — SODIUM CHLORIDE 0.9 % IV SOLN
Freq: Once | INTRAVENOUS | Status: AC
  Administered 2016-03-18: via INTRAVENOUS

## 2016-03-17 MED ORDER — SODIUM CHLORIDE 0.9 % IV SOLN
INTRAVENOUS | Status: DC
  Administered 2016-03-17 – 2016-03-20 (×9): via INTRAVENOUS

## 2016-03-17 MED ORDER — SODIUM CHLORIDE 0.9 % IV SOLN
80.0000 mg | Freq: Once | INTRAVENOUS | Status: AC
  Filled 2016-03-17: qty 80

## 2016-03-17 MED ORDER — DEXTROSE 5 % IV SOLN
2.0000 g | INTRAVENOUS | Status: DC
  Administered 2016-03-17 – 2016-03-23 (×7): 2 g via INTRAVENOUS
  Filled 2016-03-17 (×11): qty 2

## 2016-03-17 MED ORDER — SODIUM CHLORIDE 0.9 % IV SOLN
Freq: Once | INTRAVENOUS | Status: AC
  Administered 2016-03-17: 13:00:00 via INTRAVENOUS

## 2016-03-17 MED ORDER — SODIUM CHLORIDE 0.9% FLUSH
3.0000 mL | Freq: Two times a day (BID) | INTRAVENOUS | Status: DC
  Administered 2016-03-17 – 2016-03-22 (×9): 3 mL via INTRAVENOUS

## 2016-03-17 NOTE — H&P (Signed)
History and Physical  Jonathan Holden BMW:[id number] DOB: [birth year] DOA: 03/17/2016  Referring physician: Carlisle Cater ER PA,  PCP: Sinclair Grooms, MD  Outpatient Specialists: [facility]  Patient coming from: Home  & is able to ambulate without assistance   Chief Complaint: Bloody diarrhea    HPI: Jonathan Holden is a 71 y.o. male with medical history significant  for a number of hospitalizations in the last few months including several for diverticular bleeding which have been self-contained as well as A. fib being started on eliquis and a discitis leading to daily IV Rocephin 2 g. The last couple of days, patient noted bloody diarrhea, but because he previously experienced self-contained diverticular bleeds and had come in and was told it was nothing to do, he held off. However symptoms persisted today and he felt a little more lightheaded so he came into the emergency room.   ED Course: In the emergency room, patient's hemoglobin was noted to be 7.1 when 3 days ago it was 12.6. Patient was ordered 2 units packed red blood cells. The rest of his labs were unremarkable. Hospitalists were called for further evaluation.   Review of Systems: Patient seen after arrival to stepdown unit  . Pt complains of feeling a little bit tired. He is no longer having bloody diarrhea.    Pt denies any headaches, vision changes, dysphagia, chest pain, palpitations, shortness of breath, wheeze, cough, abdominal pain, hematuria, dysuria, constipation, focal extremity numbness weakness or pain .  Review of systems are otherwise negative   Past Medical History:  Diagnosis Date  . Arthritis HANDS AND RIGHT SHOULDER  . Coronary artery disease CARDIOLOGIST- DR Daneen Schick- LAST VISIT 6 MON AGO--  REQUESTED NOTE, EKG, ECHO, STRESS TEST   PT DENIES S & S  . H/O gastric ulcer   . History of MI (myocardial infarction) 1992  . Hyperlipidemia   . Hypertension   . Loose body of right shoulder     . S/P coronary angioplasty    Past Surgical History:  Procedure Laterality Date  . CARDIOVASCULAR STRESS TEST  08-22-2008---  DR Daneen Schick   NO ISCHEMIA.   Marland Kitchen CORONARY ANGIOPLASTY  1992  . ERCP W/ SPHICTEROTOMY  12-15-2002   AND STONE EXTRACTION  . LAPAROSCOPIC CHOLECYSTECTOMY  12-16-2002  . LUMBAR LAMINECTOMY  20 YRS AGO   L5  . TEE WITHOUT CARDIOVERSION N/A 02/28/2016   Procedure: TRANSESOPHAGEAL ECHOCARDIOGRAM (TEE);  Surgeon: Fay Records, MD;  Location: [facility] ENDOSCOPY;  Service: Cardiovascular;  Laterality: N/A;    Social History:  reports that he quit smoking about 25 years ago. His smoking use included Cigarettes. He has a 40.00 pack-year smoking history. He has never used smokeless tobacco. He reports that he drinks about 4.2 oz of alcohol per week . He reports that he does not use drugs.   Allergies  Allergen Reactions  . 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   Reaction:  Unknown   . Ciprofloxacin Hives    Family History  Problem Relation Age of Onset  . Other Mother     AGE 11 HEALTHY  . Heart attack Father 79    2 MI  . Emphysema    . Other Brother     HEALTHY      Prior to Admission medications   Medication Sig Start Date End Date Taking? Authorizing Provider  acidophilus (RISAQUAD) CAPS capsule Take 1 capsule by mouth at bedtime.  Yes Historical Provider, MD  Alirocumab (PRALUENT) 150 MG/ML SOPN Inject 150 mg into the skin every 14 (fourteen) days.    Yes Historical Provider, MD  apixaban (ELIQUIS) 5 MG TABS tablet Take 1 tablet (5 mg total) by mouth 2 (two) times daily. 03/09/16  Yes Ivan Anchors Love, PA-C  atorvastatin (LIPITOR) 40 MG tablet Take 1 tablet (40 mg total) by mouth daily. Patient taking differently: Take 40 mg by mouth at bedtime.  02/29/16  Yes Allie Bossier, MD  cefTRIAXone (ROCEPHIN) IVPB Inject 2 g into the vein daily. Indication:  Diskitis, bacteremia Last Day of Therapy:  04/05/16 Labs - Once weekly:  CBC/D and BMP, Labs  - Every other week:  ESR and CRP 03/09/16 04/05/16 Yes Ivan Anchors Love, PA-C  folic acid (FOLVITE) 1 MG tablet Take 1 tablet (1 mg total) by mouth daily. 03/09/16  Yes Ivan Anchors Love, PA-C  guaiFENesin (MUCINEX) 600 MG 12 hr tablet Take 1 tablet (600 mg total) by mouth 2 (two) times daily. Patient taking differently: Take 600 mg by mouth 2 (two) times daily as needed for cough or to loosen phlegm.  03/09/16  Yes Ivan Anchors Love, PA-C  ibuprofen (ADVIL,MOTRIN) 200 MG tablet Take 800 mg by mouth every 6 (six) hours as needed for mild pain or moderate pain.   Yes Historical Provider, MD  Melatonin 3 MG TABS Take 3 tablets (9 mg total) by mouth at bedtime as needed (sleep). 02/28/16  Yes Allie Bossier, MD  metoprolol (LOPRESSOR) 25 MG tablet Take 0.5 tablets (12.5 mg total) by mouth 2 (two) times daily. 03/15/16  Yes Ripley Fraise, MD  Multiple Vitamin (MULTIVITAMIN WITH MINERALS) TABS tablet Take 1 tablet by mouth daily.    Yes Historical Provider, MD  prednisoLONE acetate (PRED FORTE) 1 % ophthalmic suspension Place 1 drop into the left eye daily. 03/09/16  Yes Bary Leriche, PA-C    Physical Exam: BP 107/70 (BP Location: Left Arm)   Pulse 86   Temp 98 F (36.7 C) (Oral)   Resp 16   Ht 6' 2"  (1.88 m)   Wt 86.2 kg (190 lb 0.6 oz)   SpO2 99%   BMI 24.40 kg/m   General:  Alert and oriented 3, no acute distress   Eyes: Sclera nonicteric, extraocular movements are intact   ENT: Normocephalic, atraumatic communication remains are slightly dry   Neck: Supple, no JVD   Cardiovascular: Regular rate and rhythm, S1-S2   Respiratory: Clear to auscultation bilaterally   Abdomen: Soft, nontender, nondistended, normo bowel sounds : Skin: No skin breaks, tears or lesions  Musculoskeletal: No clubbing or cyanosis or edema  Psychiatric: Patient is appropriate, no evidence of psychoses  Neurologic: No focal deficits           Labs on Admission:  Basic Metabolic Panel:  Recent Labs Lab 03/11/16 0420  03/14/16 2348 03/17/16 1149  NA 135 134* 135  K 4.1 4.3 4.2  CL 98* 97* 108  CO2 25  --  22  GLUCOSE 120* 116* 170*  BUN 17 15 21*  CREATININE 0.95 1.00 0.79  CALCIUM 9.0  --  7.9*   Liver Function Tests:  Recent Labs Lab 03/17/16 1149  AST 16  ALT 26  ALKPHOS 71  BILITOT 0.4  PROT 5.6*  ALBUMIN 2.4*   No results for input(s): LIPASE, AMYLASE in the last 168 hours. No results for input(s): AMMONIA in the last 168 hours. CBC:  Recent Labs Lab 03/11/16 0420 03/14/16  2348 03/17/16 1149  WBC 7.9  --  11.1*  HGB 11.1* 12.6* 7.1*  HCT 33.7* 37.0* 21.2*  MCV 94.4  --  90.6  PLT 308  --  244   Cardiac Enzymes:  Recent Labs Lab 03/10/16 1757 03/11/16 0125 03/11/16 0420 03/11/16 1452  TROPONINI <0.03 <0.03 0.03* <0.03    BNP (last 3 results) No results for input(s): BNP in the last 8760 hours.  ProBNP (last 3 results) No results for input(s): PROBNP in the last 8760 hours.  CBG:  Recent Labs Lab 03/12/16 3953  UYEBXI 35    Radiological Exams on Admission: No results found.  EKG: Not done  Assessment/Plan Present on Admission: . GI bleed Secondary acute blood loss anemia: Secondary to suspected diverticular bleed exacerbated by eliquis. Eliquis on hold. Transfusing 2 units packed red blood cells. As patient had a recent colonoscopy, GI wants to hold off on scope. They will monitor how he does after receiving blood and if no further bleeding, scoping not be indicated. Continue to monitor in stepdown . Hyperlipidemia . CAD (coronary artery disease), native coronary artery . Essential hypertension: Holding antihypertensives secondary to blood loss . Atrial fibrillation paroxysmal: Currently normal sinus rhythm. Holding eliquis secondary to bleeding  . 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 Acute blood loss anemia  Active Problems:   Hyperlipidemia   CAD (coronary artery disease), native coronary  artery   Essential hypertension   Atrial fibrillation with RVR (HCC)   Lumbar discitis   Endocarditis of native valve   Acute blood loss anemia   GI bleed   DVT prophylaxis: SCDs   Code Status: Full code as per pt   Family Communication: No family present. Left message with significant other   Disposition Plan: Given severity of blood loss in the blood transfusion, expect he will be here for several days  Consults called: [facility]   Admission status: given expectation of being here for several days requiring Hospital services, have admitted as inpatient     Annita Brod MD [facility] Pager [phone]  If 7PM-7AM, please contact night-coverage [email or web address] Password [facility]  03/17/2016, 5:14 PM

## 2016-03-17 NOTE — ED Triage Notes (Signed)
Per EMS, pt complains of blood in stool since yesterday, witnessed altered/unconscious mental status. Pt was pale, dry and altered upon EMS arrival. Pt received 500cc fluid en route to hospital. Pt had fever of 101.7. Pt received 1000mg  tylenol.

## 2016-03-17 NOTE — ED Provider Notes (Signed)
[place] DEPT Provider Note   CSN: [id number] Arrival date & time: 03/17/16  1056     History   Chief Complaint Chief Complaint  Patient presents with  . Near Syncope    HPI Jonathan Holden is a 71 y.o. male.  Patient with history of CAD S/P angioplasty, HTN, HLD, prior MI, paroxysmal atrial fibrillation (CHADS-Vasc 3, anticoagulated with Eliquis), and recent admission from 2/2 - 2/9 for lumbar diskitis and streptococcal bacteremia followed by admission to acute rehab. He has had admission for symptomatic hypotension as well. He presents with complaint of rectal bleeding starting yesterday. Patient has had multiple episodes of bright red blood. No abdominal pain or chest pain. Patient has had a history of polyps that were removed. No history of diverticulosis or rectal bleeding. Patient was on the toilet this morning and had severe lightheadedness and nearly syncopized. No chest pain or shortness of breath. Patient is on daily Rocephin infusions at home. The onset of this condition was acute. The course is constant. Aggravating factors: none. Alleviating factors: none.   Most previous colonoscopy 03/2014 with sessile polyp, diverticulosis.        Past Medical History:  Diagnosis Date  . Arthritis HANDS AND RIGHT SHOULDER  . Coronary artery disease CARDIOLOGIST- DR Daneen Schick- LAST VISIT 6 MON AGO--  REQUESTED NOTE, EKG, ECHO, STRESS TEST   PT DENIES S & S  . H/O gastric ulcer   . History of MI (myocardial infarction) 1992  . Hyperlipidemia   . Hypertension   . Loose body of right shoulder   . S/P coronary angioplasty     Patient Active Problem List   Diagnosis Date Noted  . Hypotension 03/11/2016  . Near syncope 03/10/2016  . Gastroesophageal reflux disease   . Acute blood loss anemia   . Hypoalbuminemia due to protein-calorie malnutrition ([place])   . Benign essential HTN   . New onset a-fib ([place])   . Bacteremia   . Pulmonary hypertension   . Low back pain   .  Ileus ([place])   . Endocarditis of native valve   . Infection of intervertebral disc ([place])   . Streptococcal bacteremia   . Lumbar discitis   . Sepsis ([place]) 02/21/2016  . Hyponatremia 02/21/2016  . Atrial fibrillation with RVR ([place]) 02/21/2016  . Back pain 02/21/2016  . Acute encephalopathy 02/21/2016  . CAD (coronary artery disease), native coronary artery 09/19/2014  . Essential hypertension 09/19/2014  . Hyperlipidemia 10/03/2013    Past Surgical History:  Procedure Laterality Date  . CARDIOVASCULAR STRESS TEST  08-22-2008---  DR Daneen Schick   NO ISCHEMIA.   Marland Kitchen CORONARY ANGIOPLASTY  1992  . ERCP W/ SPHICTEROTOMY  12-15-2002   AND STONE EXTRACTION  . LAPAROSCOPIC CHOLECYSTECTOMY  12-16-2002  . LUMBAR LAMINECTOMY  20 YRS AGO   L5  . TEE WITHOUT CARDIOVERSION N/A 02/28/2016   Procedure: TRANSESOPHAGEAL ECHOCARDIOGRAM (TEE);  Surgeon: Fay Records, MD;  Location: [facility] ENDOSCOPY;  Service: Cardiovascular;  Laterality: N/A;       Home Medications    Prior to Admission medications   Medication Sig Start Date End Date Taking? Authorizing Provider  Alirocumab (PRALUENT) 150 MG/ML SOPN Inject 1 pen into the skin every 14 (fourteen) days.    Historical Provider, MD  apixaban (ELIQUIS) 5 MG TABS tablet Take 1 tablet (5 mg total) by mouth 2 (two) times daily. 03/09/16   Bary Leriche, PA-C  atorvastatin (LIPITOR) 40 MG tablet Take 1 tablet (40 mg total) by  mouth daily. 02/29/16   Allie Bossier, MD  cefTRIAXone (ROCEPHIN) IVPB Inject 2 g into the vein daily. Indication:  Diskitis, bacteremia Last Day of Therapy:  04/05/16 Labs - Once weekly:  CBC/D and BMP, Labs - Every other week:  ESR and CRP 03/09/16 04/05/16  Ivan Anchors Love, PA-C  folic acid (FOLVITE) 1 MG tablet Take 1 tablet (1 mg total) by mouth daily. 03/09/16   Bary Leriche, PA-C  guaiFENesin (MUCINEX) 600 MG 12 hr tablet Take 1 tablet (600 mg total) by mouth 2 (two) times daily. 03/09/16   Ivan Anchors Love, PA-C  lidocaine (LIDODERM) 5 %  Apply to back at 11 pm and remove at 11 am daily. 03/09/16   Bary Leriche, PA-C  Melatonin 3 MG TABS Take 3 tablets (9 mg total) by mouth at bedtime as needed (sleep). 02/28/16   Allie Bossier, MD  metoprolol (LOPRESSOR) 25 MG tablet Take 0.5 tablets (12.5 mg total) by mouth 2 (two) times daily. 03/15/16   Ripley Fraise, MD  Multiple Vitamin (MULTIVITAMIN WITH MINERALS) TABS tablet Take 1 tablet by mouth daily. Centrum    Historical Provider, MD  oxyCODONE-acetaminophen (PERCOCET/ROXICET) 5-325 MG tablet Take 1 tablet by mouth every 8 (eight) hours as needed for severe pain. 03/09/16   Ivan Anchors Love, PA-C  pantoprazole (PROTONIX) 40 MG tablet Take 1 tablet (40 mg total) by mouth 2 (two) times daily. 03/09/16   Ivan Anchors Love, PA-C  prednisoLONE acetate (PRED FORTE) 1 % ophthalmic suspension Place 1 drop into the left eye daily. 03/09/16   Bary Leriche, PA-C  thiamine 100 MG tablet Take 1 tablet (100 mg total) by mouth daily. 02/29/16   Allie Bossier, MD    Family History Family History  Problem Relation Age of Onset  . Other Mother     AGE 10 HEALTHY  . Heart attack Father 19    2 MI  . Emphysema    . Other Brother     HEALTHY    Social History Social History  Substance Use Topics  . Smoking status: Former Smoker    Packs/day: 2.00    Years: 20.00    Types: Cigarettes    Quit date: 03/27/1990  . Smokeless tobacco: Never Used  . Alcohol use 4.2 oz/week    7 Glasses of wine per week     Allergies   Other and Ciprofloxacin   Review of Systems Review of Systems  Constitutional: Negative for fever (isolated temp noted by EMS).  HENT: Negative for rhinorrhea and sore throat.   Eyes: Negative for redness.  Respiratory: Negative for cough.   Cardiovascular: Negative for chest pain.  Gastrointestinal: Positive for blood in stool. Negative for abdominal pain, diarrhea, nausea and vomiting.  Genitourinary: Negative for dysuria.  Musculoskeletal: Negative for myalgias.  Skin:  Positive for pallor. Negative for rash.  Neurological: Positive for light-headedness. Negative for headaches.     Physical Exam Updated Vital Signs BP 96/66 (BP Location: Left Arm)   Pulse 104   Temp 97.6 F (36.4 C) (Oral)   Ht 6' 2"  (1.88 m)   Wt 86.2 kg   SpO2 97%   BMI 24.39 kg/m   Physical Exam  Constitutional: He appears well-developed and well-nourished.  HENT:  Head: Normocephalic and atraumatic.  Mouth/Throat: Oropharynx is clear and moist.  Eyes: Right eye exhibits no discharge. Left eye exhibits no discharge.  Pale conjunctiva.  Neck: Normal range of motion. Neck supple.  Cardiovascular: Regular rhythm  and normal heart sounds.  Tachycardia present.   Mild tachycardia  Pulmonary/Chest: Effort normal and breath sounds normal.  Abdominal: Soft. There is no tenderness.  Genitourinary: Rectal exam shows guaiac positive stool. Rectal exam shows no external hemorrhoid, no internal hemorrhoid and no mass. Prostate is not tender.  Neurological: He is alert.  Skin: Skin is warm and dry. There is pallor.  Psychiatric: He has a normal mood and affect.  Nursing note and vitals reviewed.    ED Treatments / Results  Labs (all labs ordered are listed, but only abnormal results are displayed) Labs Reviewed  COMPREHENSIVE METABOLIC PANEL - Abnormal; Notable for the following:       Result Value   Glucose, Bld 170 (*)    BUN 21 (*)    Calcium 7.9 (*)    Total Protein 5.6 (*)    Albumin 2.4 (*)    All other components within normal limits  CBC - Abnormal; Notable for the following:    WBC 11.1 (*)    RBC 2.34 (*)    Hemoglobin 7.1 (*)    HCT 21.2 (*)    All other components within normal limits  PROTIME-INR - Abnormal; Notable for the following:    Prothrombin Time 17.9 (*)    All other components within normal limits  POC OCCULT BLOOD, ED - Abnormal; Notable for the following:    Fecal Occult Bld POSITIVE (*)    All other components within normal limits  I-STAT  CG4 LACTIC ACID, ED  TYPE AND SCREEN  PREPARE RBC (CROSSMATCH)  ABO/RH    Procedures Procedures (including critical care time)  Medications Ordered in ED Medications  sodium chloride 0.9 % bolus 1,000 mL (not administered)     Initial Impression / Assessment and Plan / ED Course  I have reviewed the triage vital signs and the nursing notes.  Pertinent labs & imaging results that were available during my care of the patient were reviewed by me and considered in my medical decision making (see chart for details).     Patient seen and examined. Work-up initiated. Fluid bolus ordered. Awaiting hemoglobin.  Vital signs reviewed and are as follows: BP 96/66 (BP Location: Left Arm)   Pulse 104   Temp 97.6 F (36.4 C) (Oral)   Ht 6' 2"  (1.88 m)   Wt 86.2 kg   SpO2 97%   BMI 24.39 kg/m   12:15 PM Pt stable. D/w Dr. Leonette Monarch. Hgb drop of 5.5 points in 2 days. Transfusion ordered. Pt updated. Discussed risks and benefits of transfusion. He agrees to proceed.   12:59 PM Spoke with Dr. Michail Sermon who will see patient.   Spoke with Dr. [place] Pink who will admit.    Final Clinical Impressions(s) / ED Diagnoses   Final diagnoses:  Acute lower GI bleeding  Near syncope  Symptomatic anemia   Admit.   New Prescriptions New Prescriptions   No medications on file     Carlisle Cater, PA-C 03/17/16 [place], [place] 03/18/16 [phone]

## 2016-03-17 NOTE — Telephone Encounter (Signed)
There is a lot going on. Not much related to heart.

## 2016-03-17 NOTE — Consult Note (Signed)
Referring Provider: Dr. [place] Pink Primary Care Physician:  Sinclair Grooms, MD Primary Gastroenterologist:  Althia Forts  Reason for Consultation:  Rectal bleeding  HPI: Jonathan Holden is a 71 y.o. male with multiple medical problems who is on Eliquis and was in the hospital earlier this month for strep bacteremia and lumbar diskitis. He had onset of rectal bleeding yesterday described as several episodes of bright red blood per rectum. Denies abdominal pain. Denies N/V. Felt dizzy yesterday and today. Hgb 7.1 on admit today (12.6 on 03/14/16). Last colonoscopy that showed diverticulosis, internal hemorrhoids, and 3 benign polyps which were removed.   Past Medical History:  Diagnosis Date  . Arthritis HANDS AND RIGHT SHOULDER  . Coronary artery disease CARDIOLOGIST- DR Daneen Schick- LAST VISIT 6 MON AGO--  REQUESTED NOTE, EKG, ECHO, STRESS TEST   PT DENIES S & S  . H/O gastric ulcer   . History of MI (myocardial infarction) 1992  . Hyperlipidemia   . Hypertension   . Loose body of right shoulder   . S/P coronary angioplasty     Past Surgical History:  Procedure Laterality Date  . CARDIOVASCULAR STRESS TEST  08-22-2008---  DR Daneen Schick   NO ISCHEMIA.   Marland Kitchen CORONARY ANGIOPLASTY  1992  . ERCP W/ SPHICTEROTOMY  12-15-2002   AND STONE EXTRACTION  . LAPAROSCOPIC CHOLECYSTECTOMY  12-16-2002  . LUMBAR LAMINECTOMY  20 YRS AGO   L5  . TEE WITHOUT CARDIOVERSION N/A 02/28/2016   Procedure: TRANSESOPHAGEAL ECHOCARDIOGRAM (TEE);  Surgeon: Fay Records, MD;  Location: [facility] ENDOSCOPY;  Service: Cardiovascular;  Laterality: N/A;    Prior to Admission medications   Medication Sig Start Date End Date Taking? Authorizing Provider  acidophilus (RISAQUAD) CAPS capsule Take 1 capsule by mouth at bedtime.    Yes Historical Provider, MD  Alirocumab (PRALUENT) 150 MG/ML SOPN Inject 150 mg into the skin every 14 (fourteen) days.    Yes Historical Provider, MD  apixaban (ELIQUIS) 5 MG TABS tablet Take 1 tablet  (5 mg total) by mouth 2 (two) times daily. 03/09/16  Yes Ivan Anchors Love, PA-C  atorvastatin (LIPITOR) 40 MG tablet Take 1 tablet (40 mg total) by mouth daily. Patient taking differently: Take 40 mg by mouth at bedtime.  02/29/16  Yes Allie Bossier, MD  cefTRIAXone (ROCEPHIN) IVPB Inject 2 g into the vein daily. Indication:  Diskitis, bacteremia Last Day of Therapy:  04/05/16 Labs - Once weekly:  CBC/D and BMP, Labs - Every other week:  ESR and CRP 03/09/16 04/05/16 Yes Ivan Anchors Love, PA-C  folic acid (FOLVITE) 1 MG tablet Take 1 tablet (1 mg total) by mouth daily. 03/09/16  Yes Ivan Anchors Love, PA-C  guaiFENesin (MUCINEX) 600 MG 12 hr tablet Take 1 tablet (600 mg total) by mouth 2 (two) times daily. Patient taking differently: Take 600 mg by mouth 2 (two) times daily as needed for cough or to loosen phlegm.  03/09/16  Yes Ivan Anchors Love, PA-C  ibuprofen (ADVIL,MOTRIN) 200 MG tablet Take 800 mg by mouth every 6 (six) hours as needed for mild pain or moderate pain.   Yes Historical Provider, MD  Melatonin 3 MG TABS Take 3 tablets (9 mg total) by mouth at bedtime as needed (sleep). 02/28/16  Yes Allie Bossier, MD  metoprolol (LOPRESSOR) 25 MG tablet Take 0.5 tablets (12.5 mg total) by mouth 2 (two) times daily. 03/15/16  Yes Ripley Fraise, MD  Multiple Vitamin (MULTIVITAMIN WITH MINERALS) TABS tablet Take 1 tablet by  mouth daily.    Yes Historical Provider, MD  prednisoLONE acetate (PRED FORTE) 1 % ophthalmic suspension Place 1 drop into the left eye daily. 03/09/16  Yes Ivan Anchors Love, PA-C    Scheduled Meds: . [START ON 03/21/2016] pantoprazole  40 mg Intravenous Q12H  . sodium chloride flush  3 mL Intravenous Q12H   Continuous Infusions: . sodium chloride 100 mL/hr at 03/17/16 1530  . pantoprozole (PROTONIX) infusion     PRN Meds:.ondansetron **OR** ondansetron (ZOFRAN) IV  Allergies as of 03/17/2016 - Review Complete 03/17/2016  Allergen Reaction Noted  . Food Other (See Comments) 03/17/2016  .  Ciprofloxacin Hives 02/17/2016    Family History  Problem Relation Age of Onset  . Other Mother     AGE 48 HEALTHY  . Heart attack Father 10    2 MI  . Emphysema    . Other Brother     HEALTHY    Social History   Social History  . Marital status: Divorced    Spouse name: N/A  . Number of children: N/A  . Years of education: N/A   Occupational History  . Not on file.   Social History Main Topics  . Smoking status: Former Smoker    Packs/day: 2.00    Years: 20.00    Types: Cigarettes    Quit date: 03/27/1990  . Smokeless tobacco: Never Used  . Alcohol use 4.2 oz/week    7 Glasses of wine per week  . Drug use: No  . Sexual activity: Not on file   Other Topics Concern  . Not on file   Social History Narrative  . No narrative on file    Review of Systems: All negative except as stated above in HPI.  Physical Exam: Vital signs: Vitals:   03/17/16 1400 03/17/16 1430  BP: 106/64 102/70  Pulse: 88 86  Resp: 16 18  Temp:    T 98.5   General:  Lethargic, pale, pleasant and cooperative in NAD HEENT: anicteric sclera Neck: supple, nontender Lungs:  Clear throughout to auscultation.   No wheezes, crackles, or rhonchi. No acute distress. Heart:  Regular rate and rhythm; no murmurs, clicks, rubs,  or gallops. Abdomen: right-sided tenderness with guarding, soft, nondistended, +BS  Rectal:  Deferred Ext: no edema  GI:  Lab Results:  Recent Labs  03/14/16 2348 03/17/16 1149  WBC  --  11.1*  HGB 12.6* 7.1*  HCT 37.0* 21.2*  PLT  --  244   BMET  Recent Labs  03/14/16 2348 03/17/16 1149  NA 134* 135  K 4.3 4.2  CL 97* 108  CO2  --  22  GLUCOSE 116* 170*  BUN 15 21*  CREATININE 1.00 0.79  CALCIUM  --  7.9*   LFT  Recent Labs  03/17/16 1149  PROT 5.6*  ALBUMIN 2.4*  AST 16  ALT 26  ALKPHOS 71  BILITOT 0.4   PT/INR  Recent Labs  03/17/16 1149  LABPROT 17.9*  INR 1.46     Studies/Results: No results  found.  Impression/Plan: Lower GI bleed likely diverticular in origin and in the setting of Eliquis. Doubt an upper GI source. Continue supportive care. Agree with blood transfusions. Clear liquid diet. Hold off on a repeat colonoscopy unless bleeding persists. I do not think an EGD is needed at this time. Ok to continue Protonix drip today and change to Protonix 40 mg IV Q 12 hours tomorrow if no further bleeding.    LOS: 0 days  [place] C.  03/17/2016, 3:45 PM  Pager [phone]  If no answer or after 5 PM call [phone]

## 2016-03-17 NOTE — Telephone Encounter (Signed)
Will route to Dr. Smith to make him aware. 

## 2016-03-17 NOTE — Telephone Encounter (Signed)
Follow up    [facility] is calling about pt. She said he is back in the hospital in the Er. She states pt is at [facility]. She is wanting Dr. Tamala Julian to know. She is very concerned with his care at the hospital.

## 2016-03-17 NOTE — ED Notes (Signed)
Bed: [id number] Expected date:  Expected time:  Means of arrival:  Comments: EMS- 71yo M, syncope/GI bleed

## 2016-03-18 ENCOUNTER — Ambulatory Visit: Payer: Medicare Other | Admitting: Physician Assistant

## 2016-03-18 DIAGNOSIS — D62 Acute posthemorrhagic anemia: Secondary | ICD-10-CM

## 2016-03-18 LAB — CBC
HEMATOCRIT: 24.5 % — AB (ref 39.0–52.0)
Hemoglobin: 8.5 g/dL — ABNORMAL LOW (ref 13.0–17.0)
MCH: 30.9 pg (ref 26.0–34.0)
MCHC: 34.7 g/dL (ref 30.0–36.0)
MCV: 89.1 fL (ref 78.0–100.0)
PLATELETS: 161 10*3/uL (ref 150–400)
RBC: 2.75 MIL/uL — AB (ref 4.22–5.81)
RDW: 14.7 % (ref 11.5–15.5)
WBC: 9 10*3/uL (ref 4.0–10.5)

## 2016-03-18 LAB — BASIC METABOLIC PANEL
Anion gap: 3 — ABNORMAL LOW (ref 5–15)
BUN: 15 mg/dL (ref 6–20)
CHLORIDE: 110 mmol/L (ref 101–111)
CO2: 24 mmol/L (ref 22–32)
CREATININE: 0.7 mg/dL (ref 0.61–1.24)
Calcium: 7.9 mg/dL — ABNORMAL LOW (ref 8.9–10.3)
Glucose, Bld: 115 mg/dL — ABNORMAL HIGH (ref 65–99)
POTASSIUM: 3.9 mmol/L (ref 3.5–5.1)
SODIUM: 137 mmol/L (ref 135–145)

## 2016-03-18 LAB — HEMOGLOBIN AND HEMATOCRIT, BLOOD
HEMATOCRIT: 22.6 % — AB (ref 39.0–52.0)
Hemoglobin: 7.9 g/dL — ABNORMAL LOW (ref 13.0–17.0)

## 2016-03-18 LAB — PREPARE RBC (CROSSMATCH)

## 2016-03-18 MED ORDER — LIDOCAINE 5 % EX PTCH
1.0000 | MEDICATED_PATCH | CUTANEOUS | Status: DC
  Administered 2016-03-18 – 2016-03-23 (×4): 1 via TRANSDERMAL
  Filled 2016-03-18 (×7): qty 1

## 2016-03-18 MED ORDER — CHLORHEXIDINE GLUCONATE CLOTH 2 % EX PADS
6.0000 | MEDICATED_PAD | Freq: Every day | CUTANEOUS | Status: DC
  Administered 2016-03-19 – 2016-03-24 (×6): 6 via TOPICAL

## 2016-03-18 MED ORDER — SODIUM CHLORIDE 0.9 % IV SOLN
Freq: Once | INTRAVENOUS | Status: AC
  Administered 2016-03-18: 17:00:00 via INTRAVENOUS

## 2016-03-18 MED ORDER — SODIUM CHLORIDE 0.9% FLUSH
10.0000 mL | INTRAVENOUS | Status: DC | PRN
  Administered 2016-03-21 – 2016-03-24 (×4): 10 mL
  Filled 2016-03-18 (×4): qty 40

## 2016-03-18 NOTE — Progress Notes (Signed)
[facility]  Patient Status: Active  [facility] is providing the following services: SN  If patient discharges after hours, please call [phone].   Jonathan Holden 03/18/2016, 1:09 PM

## 2016-03-18 NOTE — Progress Notes (Signed)
[facility]  Jonathan Holden is an active pt with [facility] for home IV ABX.   [facility] hospital team will follow Jonathan Holden while an inpatient to support DC home when ordered.  If patient discharges after hours, please call [phone].   [name] 03/18/2016, 10:56 PM

## 2016-03-18 NOTE — Progress Notes (Signed)
[facility] Gastroenterology Progress Note  Jonathan Holden 71 y.o. 1945-04-08   Subjective: Continues to have rectal bleeding. Denies abdominal pain.  Objective: Vital signs in last 24 hours: Vitals:   03/18/16 0800 03/18/16 1000  BP: 113/64 119/69  Pulse:    Resp: (!) 34 (!) 25  Temp:    P 85; T 98.4  Physical Exam: Gen: alert, no acute distress, elderly, pleasant CV: RRR Chest: CTA B Abd: soft, nontender, nondistended, +BS  Lab Results:  Recent Labs  03/17/16 1149 03/18/16 0800  NA 135 137  K 4.2 3.9  CL 108 110  CO2 22 24  GLUCOSE 170* 115*  BUN 21* 15  CREATININE 0.79 0.70  CALCIUM 7.9* 7.9*    Recent Labs  03/17/16 1149  AST 16  ALT 26  ALKPHOS 71  BILITOT 0.4  PROT 5.6*  ALBUMIN 2.4*    Recent Labs  03/17/16 1149 03/17/16 2137 03/18/16 0800  WBC 11.1*  --  9.0  HGB 7.1* 7.1* 8.5*  HCT 21.2* 20.9* 24.5*  MCV 90.6  --  89.1  PLT 244  --  161    Recent Labs  03/17/16 1149  LABPROT 17.9*  INR 1.46      Assessment/Plan: Suspected diverticular bleed that has been exacerbated by Eliquis. Hgb improved to 8.5 after 2 U PRBCs (Admit Hgb 7.1). Continue conservative management. Continue on clear liquids today and slowly advance tomorrow if bleeding stops.   Jonathan C. 03/18/2016, 11:46 AM   Pager [phone]  If no answer or after 5 PM call [phone] ID: Jonathan Holden, male   DOB: Apr 30, 1945, 71 y.o.   MRN: [id number]

## 2016-03-18 NOTE — Progress Notes (Signed)
PROGRESS NOTE    Jonathan Holden  [id number] DOB: October 10, 1945 DOA: 03/17/2016 PCP: Sinclair Grooms, MD    Brief Narrative: Jonathan Holden is a 71 y.o. male with medical history significant  for a number of hospitalizations in the last few months including several for diverticular bleeding which have been self-contained as well as A. fib being started on eliquis and a discitis leading to daily IV Rocephin 2 g. The last couple of days, patient noted bloody diarrhea, but because he previously experienced self-contained diverticular bleeds and had come in and was told it was nothing to do, he held off. However symptoms persisted today and he felt a little more lightheaded so he came into the emergency room.   ED Course: In the emergency room, patient's hemoglobin was noted to be 7.1 when 3 days ago it was 12.6. Patient was ordered 2 units packed red blood cells. The rest of his labs were unremarkable. Hospitalists were called for further evaluation.     Assessment & Plan:   Active Problems:   Hyperlipidemia   CAD (coronary artery disease), native coronary artery   Essential hypertension   Atrial fibrillation with RVR (HCC)   Lumbar discitis   Endocarditis of native valve   Acute blood loss anemia   GI bleed   1-Acute blood loss anemia, in setting of GI bleed, diverticular bleed.  He has received 4 units of PRBC.  Hb this am at 8. Repeat hb every twelve hours. Further transfusion if patient bleed more of hb trending down.  IV Protonix.  Appreciate Dr Hubbard Hartshorn assistance.  Holding eliquis.   2-history of paroxysmal [name];  Holding eliquis due to GI bleed.  Resume metoprolol when BP stable.   3-Lumbar Discitis;  Continue with IV ceftriaxone.  Will try lidocaine patch for back pain.     DVT prophylaxis: SCD, no anticoagulation in setting of bleeding.  Code Status: Full code.  Family Communication: care discussed with patient, and Brother who was at bedside.  Disposition  Plan: remain in the step down unit, patient with Active GI bleed.   Consultants:   Dr Michail Sermon.    Procedures: none   Antimicrobials:   Ceftriaxone.    Subjective: He had 2 blood BM last night and one this morning. BM with less amount of blood.  He denies abdominal pain.  He report taking 800 mg ibuprofen twice in 24 hour. He did that after he started having Blood in the stool.  He was taking Vicodin for back pain, but stop taking it 3 days prior to admission. He does not wants to take opioids.   Objective: Vitals:   03/18/16 0400 03/18/16 0428 03/18/16 0724 03/18/16 0800  BP: 123/82 110/72 121/71 113/64  Pulse:   85   Resp: 19 12  (!) 34  Temp: 97.6 F (36.4 C) 98.1 F (36.7 C) 98.4 F (36.9 C)   TempSrc: Oral Oral Oral   SpO2: 98% 98%  97%  Weight:      Height:        Intake/Output Summary (Last 24 hours) at 03/18/16 0944 Last data filed at 03/18/16 0800  Gross per 24 hour  Intake           3519.5 ml  Output              400 ml  Net           3119.5 ml   Filed Weights   03/17/16 1126 03/17/16 1600  Weight: 86.2 kg (190 lb) 86.2 kg (190 lb 0.6 oz)    Examination:  General exam: Appears calm and comfortable  Respiratory system: Clear to auscultation. Respiratory effort normal. Cardiovascular system: S1 & S2 heard, RRR. No JVD, murmurs, rubs, gallops or clicks. No pedal edema. Gastrointestinal system: Abdomen is nondistended, soft and nontender. No organomegaly or masses felt. Normal bowel sounds heard. Central nervous system: Alert and oriented. No focal neurological deficits. Extremities: Symmetric 5 x 5 power. Skin: No rashes, lesions or ulcers     Data Reviewed: I have personally reviewed following labs and imaging studies  CBC:  Recent Labs Lab 03/14/16 2348 03/17/16 1149 03/17/16 2137  WBC  --  11.1*  --   HGB 12.6* 7.1* 7.1*  HCT 37.0* 21.2* 20.9*  MCV  --  90.6  --   PLT  --  244  --    Basic Metabolic Panel:  Recent Labs Lab  03/14/16 2348 03/17/16 1149  NA 134* 135  K 4.3 4.2  CL 97* 108  CO2  --  22  GLUCOSE 116* 170*  BUN 15 21*  CREATININE 1.00 0.79  CALCIUM  --  7.9*   GFR: Estimated Creatinine Clearance: 98.5 mL/min (by C-G formula based on SCr of 0.79 mg/dL). Liver Function Tests:  Recent Labs Lab 03/17/16 1149  AST 16  ALT 26  ALKPHOS 71  BILITOT 0.4  PROT 5.6*  ALBUMIN 2.4*   No results for input(s): LIPASE, AMYLASE in the last 168 hours. No results for input(s): AMMONIA in the last 168 hours. Coagulation Profile:  Recent Labs Lab 03/17/16 1149  INR 1.46   Cardiac Enzymes:  Recent Labs Lab 03/11/16 1452  TROPONINI <0.03   BNP (last 3 results) No results for input(s): PROBNP in the last 8760 hours. HbA1C: No results for input(s): HGBA1C in the last 72 hours. CBG:  Recent Labs Lab 03/12/16 0616  GLUCAP 97   Lipid Profile: No results for input(s): CHOL, HDL, LDLCALC, TRIG, CHOLHDL, LDLDIRECT in the last 72 hours. Thyroid Function Tests: No results for input(s): TSH, T4TOTAL, FREET4, T3FREE, THYROIDAB in the last 72 hours. Anemia Panel: No results for input(s): VITAMINB12, FOLATE, FERRITIN, TIBC, IRON, RETICCTPCT in the last 72 hours. Sepsis Labs:  Recent Labs Lab 03/14/16 2349 03/17/16 1203  LATICACIDVEN 1.11 1.02    Recent Results (from the past 240 hour(s))  Culture, blood (Routine x 2)     Status: None   Collection Time: 03/10/16  4:55 PM  Result Value Ref Range Status   Specimen Description BLOOD PICC LINE  Final   Special Requests BOTTLES DRAWN AEROBIC AND ANAEROBIC 5CC  Final   Culture NO GROWTH 5 DAYS  Final   Report Status 03/15/2016 FINAL  Final  Culture, blood (Routine x 2)     Status: None   Collection Time: 03/10/16  6:08 PM  Result Value Ref Range Status   Specimen Description BLOOD LEFT ANTECUBITAL  Final   Special Requests BOTTLES DRAWN AEROBIC AND ANAEROBIC 5CC  Final   Culture NO GROWTH 5 DAYS  Final   Report Status 03/15/2016 FINAL   Final  MRSA PCR Screening     Status: None   Collection Time: 03/17/16  3:30 PM  Result Value Ref Range Status   MRSA by PCR NEGATIVE NEGATIVE Final    Comment:        The GeneXpert MRSA Assay (FDA approved for NASAL specimens only), is one component of a comprehensive MRSA colonization surveillance program. It is  not intended to diagnose MRSA infection nor to guide or monitor treatment for MRSA infections.          Radiology Studies: No results found.      Scheduled Meds: . cefTRIAXone (ROCEPHIN)  IV  2 g Intravenous Q24H  . [START ON 03/21/2016] pantoprazole  40 mg Intravenous Q12H  . sodium chloride flush  3 mL Intravenous Q12H   Continuous Infusions: . sodium chloride 100 mL/hr at 03/18/16 0800  . pantoprozole (PROTONIX) infusion 8 mg/hr (03/18/16 0800)     LOS: 1 day    Time spent: 35 minutes.     Elmarie Shiley, MD [facility] Pager [phone]  If 7PM-7AM, please contact night-coverage [email or web address] Password [facility] 03/18/2016, 9:44 AM

## 2016-03-19 ENCOUNTER — Encounter: Payer: Self-pay | Admitting: Interventional Cardiology

## 2016-03-19 DIAGNOSIS — K922 Gastrointestinal hemorrhage, unspecified: Secondary | ICD-10-CM

## 2016-03-19 HISTORY — DX: Gastrointestinal hemorrhage, unspecified: K92.2

## 2016-03-19 LAB — HEMOGLOBIN AND HEMATOCRIT, BLOOD
HCT: 22.1 % — ABNORMAL LOW (ref 39.0–52.0)
HCT: 24.9 % — ABNORMAL LOW (ref 39.0–52.0)
HEMATOCRIT: 23.1 % — AB (ref 39.0–52.0)
Hemoglobin: 7.7 g/dL — ABNORMAL LOW (ref 13.0–17.0)
Hemoglobin: 8 g/dL — ABNORMAL LOW (ref 13.0–17.0)
Hemoglobin: 8.5 g/dL — ABNORMAL LOW (ref 13.0–17.0)

## 2016-03-19 LAB — PREPARE RBC (CROSSMATCH)

## 2016-03-19 MED ORDER — METOPROLOL TARTRATE 5 MG/5ML IV SOLN
5.0000 mg | INTRAVENOUS | Status: DC | PRN
  Administered 2016-03-22 – 2016-03-24 (×2): 5 mg via INTRAVENOUS
  Filled 2016-03-19 (×2): qty 5

## 2016-03-19 MED ORDER — METOPROLOL TARTRATE 25 MG PO TABS: 25.0000 mg | ORAL_TABLET | Freq: Two times a day (BID) | ORAL | Status: DC

## 2016-03-19 MED ORDER — METOPROLOL TARTRATE 25 MG PO TABS
25.0000 mg | ORAL_TABLET | Freq: Two times a day (BID) | ORAL | Status: DC
  Administered 2016-03-19 – 2016-03-21 (×5): 25 mg via ORAL
  Filled 2016-03-19 (×5): qty 1

## 2016-03-19 MED ORDER — SODIUM CHLORIDE 0.9 % IV SOLN
Freq: Once | INTRAVENOUS | Status: AC
  Administered 2016-03-19: 11:00:00 via INTRAVENOUS

## 2016-03-19 MED ORDER — METOPROLOL TARTRATE 12.5 MG HALF TABLET
12.5000 mg | ORAL_TABLET | Freq: Two times a day (BID) | ORAL | Status: DC
  Administered 2016-03-19: 12.5 mg via ORAL
  Filled 2016-03-19: qty 1

## 2016-03-19 MED ORDER — METOPROLOL TARTRATE 5 MG/5ML IV SOLN
5.0000 mg | Freq: Once | INTRAVENOUS | Status: AC
  Administered 2016-03-19: 5 mg via INTRAVENOUS
  Filled 2016-03-19: qty 5

## 2016-03-19 NOTE — Progress Notes (Signed)
[facility] Gastroenterology Progress Note  RACHEL MUNETON 71 y.o. [birth year]   Subjective: He feels that bleeding is resolving. Denies abdominal pain, N/V.  Objective: Vital signs in last 24 hours: Vitals:   03/19/16 1047 03/19/16 1048  BP:  130/74  Pulse:    Resp: 18 15  Temp:  98.2 F (36.8 C)   P 82  Physical Exam: Gen: alert, no acute distress, pleasant CV: RRR Chest: CTA B Abd: soft, nontender, nondistended, +BS Ext: no edema  Lab Results:  Recent Labs  03/17/16 1149 03/18/16 0800  NA 135 137  K 4.2 3.9  CL 108 110  CO2 22 24  GLUCOSE 170* 115*  BUN 21* 15  CREATININE 0.79 0.70  CALCIUM 7.9* 7.9*    Recent Labs  03/17/16 1149  AST 16  ALT 26  ALKPHOS 71  BILITOT 0.4  PROT 5.6*  ALBUMIN 2.4*    Recent Labs  03/17/16 1149  03/18/16 0800  03/19/16 0005 03/19/16 0415  WBC 11.1*  --  9.0  --   --   --   HGB 7.1*  < > 8.5*  < > 8.0* 7.7*  HCT 21.2*  < > 24.5*  < > 23.1* 22.1*  MCV 90.6  --  89.1  --   --   --   PLT 244  --  161  --   --   --   < > = values in this interval not displayed.  Recent Labs  03/17/16 1149  LABPROT 17.9*  INR 1.46      Assessment/Plan: Presumed diverticular bleed in the setting of Eliquis. Bleeding frequency seems to be decreasing. Receiving another blood transfusion. Hgb 7.7 (8.5). Change to full liquids. Supportive care. Will follow.   [place] C. 03/19/2016, 12:24 PM   Pager [phone]  If no answer or after 5 PM call [phone] ID: [name], male   DOB: 06-29-1945, 71 y.o.   MRN: [id number]

## 2016-03-19 NOTE — Progress Notes (Signed)
PROGRESS NOTE    Jonathan Holden  [id number] DOB: 01-Nov-1945 DOA: 03/17/2016 PCP: Sinclair Grooms, MD    Brief Narrative: Jonathan Holden is a 71 y.o. male with medical history significant  for a number of hospitalizations in the last few months including several for diverticular bleeding which have been self-contained as well as A. fib being started on eliquis and a discitis leading to daily IV Rocephin 2 g. The last couple of days, patient noted bloody diarrhea, but because he previously experienced self-contained diverticular bleeds and had come in and was told it was nothing to do, he held off. However symptoms persisted today and he felt a little more lightheaded so he came into the emergency room.   ED Course: In the emergency room, patient's hemoglobin was noted to be 7.1 when 3 days ago it was 12.6. Patient was ordered 2 units packed red blood cells. The rest of his labs were unremarkable. Hospitalists were called for further evaluation.     Assessment & Plan:   Active Problems:   Hyperlipidemia   CAD (coronary artery disease), native coronary artery   Essential hypertension   Atrial fibrillation with RVR (HCC)   Lumbar discitis   Endocarditis of native valve   Acute blood loss anemia   GI bleed   1-Acute blood loss anemia, in setting of GI bleed, diverticular bleed.  He has received 5 units of PRBC.  IV Protonix.  Appreciate Dr Hubbard Hartshorn assistance.  Holding eliquis.  Hb at 7.7 this morning. Will order another unit PRBC.  Hopefully bleeding started to decreased/ he had this morning one brown, with less bright blood on it/  2-history of paroxysmal [name];  Holding eliquis due to GI bleed.  Resume metoprolol  3-Lumbar Discitis;  Continue with IV ceftriaxone.  Patch didn't help. Wants to try tylenol.     DVT prophylaxis: SCD, no anticoagulation in setting of bleeding.  Code Status: Full code.  Family Communication: care discussed with patient.  Disposition  Plan: remain in the step down unit, patient with Active GI bleed.   Consultants:   Dr Michail Sermon.    Procedures: none   Antimicrobials:   Ceftriaxone.    Subjective: He had one BM last night , and another BM this morning   Objective: Vitals:   03/19/16 0400 03/19/16 0500 03/19/16 0600 03/19/16 0700  BP: 124/73 140/77 131/70 126/86  Pulse:      Resp: 12 17 16 15   Temp:    97.4 F (36.3 C)  TempSrc:    Oral  SpO2: 96% 95% 97% 99%  Weight:      Height:        Intake/Output Summary (Last 24 hours) at 03/19/16 0828 Last data filed at 03/19/16 0800  Gross per 24 hour  Intake             2955 ml  Output             2675 ml  Net              280 ml   Filed Weights   03/17/16 1126 03/17/16 1600  Weight: 86.2 kg (190 lb) 86.2 kg (190 lb 0.6 oz)    Examination:  General exam: Appears calm and comfortable  Respiratory system: Clear to auscultation. Respiratory effort normal. Cardiovascular system: S1 & S2 heard, RRR. No JVD, murmurs, rubs, gallops or clicks. No pedal edema. Gastrointestinal system: Abdomen is nondistended, soft and nontender. No organomegaly or masses felt. Normal  bowel sounds heard. Central nervous system: Alert and oriented. No focal neurological deficits. Extremities: Symmetric 5 x 5 power. Skin: No rashes, lesions or ulcers     Data Reviewed: I have personally reviewed following labs and imaging studies  CBC:  Recent Labs Lab 03/17/16 1149 03/17/16 2137 03/18/16 0800 03/18/16 1549 03/19/16 0005 03/19/16 0415  WBC 11.1*  --  9.0  --   --   --   HGB 7.1* 7.1* 8.5* 7.9* 8.0* 7.7*  HCT 21.2* 20.9* 24.5* 22.6* 23.1* 22.1*  MCV 90.6  --  89.1  --   --   --   PLT 244  --  161  --   --   --    Basic Metabolic Panel:  Recent Labs Lab 03/14/16 2348 03/17/16 1149 03/18/16 0800  NA 134* 135 137  K 4.3 4.2 3.9  CL 97* 108 110  CO2  --  22 24  GLUCOSE 116* 170* 115*  BUN 15 21* 15  CREATININE 1.00 0.79 0.70  CALCIUM  --  7.9* 7.9*    GFR: Estimated Creatinine Clearance: 98.5 mL/min (by C-G formula based on SCr of 0.7 mg/dL). Liver Function Tests:  Recent Labs Lab 03/17/16 1149  AST 16  ALT 26  ALKPHOS 71  BILITOT 0.4  PROT 5.6*  ALBUMIN 2.4*   No results for input(s): LIPASE, AMYLASE in the last 168 hours. No results for input(s): AMMONIA in the last 168 hours. Coagulation Profile:  Recent Labs Lab 03/17/16 1149  INR 1.46   Cardiac Enzymes: No results for input(s): CKTOTAL, CKMB, CKMBINDEX, TROPONINI in the last 168 hours. BNP (last 3 results) No results for input(s): PROBNP in the last 8760 hours. HbA1C: No results for input(s): HGBA1C in the last 72 hours. CBG: No results for input(s): GLUCAP in the last 168 hours. Lipid Profile: No results for input(s): CHOL, HDL, LDLCALC, TRIG, CHOLHDL, LDLDIRECT in the last 72 hours. Thyroid Function Tests: No results for input(s): TSH, T4TOTAL, FREET4, T3FREE, THYROIDAB in the last 72 hours. Anemia Panel: No results for input(s): VITAMINB12, FOLATE, FERRITIN, TIBC, IRON, RETICCTPCT in the last 72 hours. Sepsis Labs:  Recent Labs Lab 03/14/16 2349 03/17/16 1203  LATICACIDVEN 1.11 1.02    Recent Results (from the past 240 hour(s))  Culture, blood (Routine x 2)     Status: None   Collection Time: 03/10/16  4:55 PM  Result Value Ref Range Status   Specimen Description BLOOD PICC LINE  Final   Special Requests BOTTLES DRAWN AEROBIC AND ANAEROBIC 5CC  Final   Culture NO GROWTH 5 DAYS  Final   Report Status 03/15/2016 FINAL  Final  Culture, blood (Routine x 2)     Status: None   Collection Time: 03/10/16  6:08 PM  Result Value Ref Range Status   Specimen Description BLOOD LEFT ANTECUBITAL  Final   Special Requests BOTTLES DRAWN AEROBIC AND ANAEROBIC 5CC  Final   Culture NO GROWTH 5 DAYS  Final   Report Status 03/15/2016 FINAL  Final  MRSA PCR Screening     Status: None   Collection Time: 03/17/16  3:30 PM  Result Value Ref Range Status   MRSA by  PCR NEGATIVE NEGATIVE Final    Comment:        The GeneXpert MRSA Assay (FDA approved for NASAL specimens only), is one component of a comprehensive MRSA colonization surveillance program. It is not intended to diagnose MRSA infection nor to guide or monitor treatment for MRSA infections.  Radiology Studies: No results found.      Scheduled Meds: . sodium chloride   Intravenous Once  . cefTRIAXone (ROCEPHIN)  IV  2 g Intravenous Q24H  . Chlorhexidine Gluconate Cloth  6 each Topical Daily  . lidocaine  1 patch Transdermal Q24H  . [START ON 03/21/2016] pantoprazole  40 mg Intravenous Q12H  . sodium chloride flush  3 mL Intravenous Q12H   Continuous Infusions: . sodium chloride 100 mL/hr at 03/19/16 0800  . pantoprozole (PROTONIX) infusion 8 mg/hr (03/19/16 0800)     LOS: 2 days    Time spent: 35 minutes.     Elmarie Shiley, MD [facility] Pager [phone]  If 7PM-7AM, please contact night-coverage [email or web address] Password [facility] 03/19/2016, 8:28 AM

## 2016-03-19 NOTE — Progress Notes (Signed)
Patient has gone into atrial fibrillation with a rapid ventricular rate. Patient has a history of this and states that he takes metoprolol for this as well. He states that the last time he had this was around the 22nd of February 2018. He states that he is not feeling any chest discomfort. EKG has been preformed. MD paged and a once time dose of IV metoprolol to be ordered.

## 2016-03-19 NOTE — Progress Notes (Deleted)
PROGRESS NOTE    Jonathan Holden  [id number] DOB: 1945-11-22 DOA: 03/17/2016 PCP: Sinclair Grooms, MD    Brief Narrative: Jonathan Holden is a 71 y.o. male with medical history significant  for a number of hospitalizations in the last few months including several for diverticular bleeding which have been self-contained as well as A. fib being started on eliquis and a discitis leading to daily IV Rocephin 2 g. The last couple of days, patient noted bloody diarrhea, but because he previously experienced self-contained diverticular bleeds and had come in and was told it was nothing to do, he held off. However symptoms persisted today and he felt a little more lightheaded so he came into the emergency room.   ED Course: In the emergency room, patient's hemoglobin was noted to be 7.1 when 3 days ago it was 12.6. Patient was ordered 2 units packed red blood cells. The rest of his labs were unremarkable. Hospitalists were called for further evaluation.     Assessment & Plan:   Active Problems:   Hyperlipidemia   CAD (coronary artery disease), native coronary artery   Essential hypertension   Atrial fibrillation with RVR (HCC)   Lumbar discitis   Endocarditis of native valve   Acute blood loss anemia   GI bleed   1-Acute blood loss anemia, in setting of GI bleed, diverticular bleed.  He has received 5 units of PRBC.  IV Protonix.  Appreciate Dr Hubbard Hartshorn assistance.  Holding eliquis.  Hb at 7.7 this morning. Will order another unit PRBC.  Hopefully bleeding started to decreased/ he had this morning one brown, with less bright blood on it stool.   2-history of paroxysmal [name];  Holding eliquis due to GI bleed.  Resume metoprolol, holder parameter for BP  3-Lumbar Discitis;  Continue with IV ceftriaxone.  Patch didn't help. Wants to try tylenol.     DVT prophylaxis: SCD, no anticoagulation in setting of bleeding.  Code Status: Full code.  Family Communication: care  discussed with patient.  Disposition Plan: remain in the step down unit, patient with Active GI bleed.   Consultants:   Dr Michail Sermon.    Procedures: none   Antimicrobials:   Ceftriaxone.    Subjective: He has one BM twice a day. Report stool more brown less amount of blood.   Objective: Vitals:   03/19/16 1026 03/19/16 1033 03/19/16 1047 03/19/16 1048  BP: 116/63   130/74  Pulse:      Resp: 18 (!) 22 18 15   Temp: 99.7 F (37.6 C)   98.2 F (36.8 C)  TempSrc: Oral   Oral  SpO2: 97% 98% 98% 98%  Weight:      Height:        Intake/Output Summary (Last 24 hours) at 03/19/16 1337 Last data filed at 03/19/16 1048  Gross per 24 hour  Intake             2735 ml  Output             2175 ml  Net              560 ml   Filed Weights   03/17/16 1126 03/17/16 1600  Weight: 86.2 kg (190 lb) 86.2 kg (190 lb 0.6 oz)    Examination:  General exam: Appears calm and comfortable  Respiratory system: Clear to auscultation. Respiratory effort normal. Cardiovascular system: S1 & S2 heard, RRR. No JVD, murmurs, rubs, gallops or clicks. No pedal edema. Gastrointestinal  system: Abdomen is nondistended, soft and nontender. No organomegaly or masses felt. Normal bowel sounds heard. Central nervous system: Alert and oriented. No focal neurological deficits. Extremities: Symmetric 5 x 5 power. Skin: No rashes, lesions or ulcers     Data Reviewed: I have personally reviewed following labs and imaging studies  CBC:  Recent Labs Lab 03/17/16 1149 03/17/16 2137 03/18/16 0800 03/18/16 1549 03/19/16 0005 03/19/16 0415  WBC 11.1*  --  9.0  --   --   --   HGB 7.1* 7.1* 8.5* 7.9* 8.0* 7.7*  HCT 21.2* 20.9* 24.5* 22.6* 23.1* 22.1*  MCV 90.6  --  89.1  --   --   --   PLT 244  --  161  --   --   --    Basic Metabolic Panel:  Recent Labs Lab 03/14/16 2348 03/17/16 1149 03/18/16 0800  NA 134* 135 137  K 4.3 4.2 3.9  CL 97* 108 110  CO2  --  22 24  GLUCOSE 116* 170* 115*    BUN 15 21* 15  CREATININE 1.00 0.79 0.70  CALCIUM  --  7.9* 7.9*   GFR: Estimated Creatinine Clearance: 98.5 mL/min (by C-G formula based on SCr of 0.7 mg/dL). Liver Function Tests:  Recent Labs Lab 03/17/16 1149  AST 16  ALT 26  ALKPHOS 71  BILITOT 0.4  PROT 5.6*  ALBUMIN 2.4*   No results for input(s): LIPASE, AMYLASE in the last 168 hours. No results for input(s): AMMONIA in the last 168 hours. Coagulation Profile:  Recent Labs Lab 03/17/16 1149  INR 1.46   Cardiac Enzymes: No results for input(s): CKTOTAL, CKMB, CKMBINDEX, TROPONINI in the last 168 hours. BNP (last 3 results) No results for input(s): PROBNP in the last 8760 hours. HbA1C: No results for input(s): HGBA1C in the last 72 hours. CBG: No results for input(s): GLUCAP in the last 168 hours. Lipid Profile: No results for input(s): CHOL, HDL, LDLCALC, TRIG, CHOLHDL, LDLDIRECT in the last 72 hours. Thyroid Function Tests: No results for input(s): TSH, T4TOTAL, FREET4, T3FREE, THYROIDAB in the last 72 hours. Anemia Panel: No results for input(s): VITAMINB12, FOLATE, FERRITIN, TIBC, IRON, RETICCTPCT in the last 72 hours. Sepsis Labs:  Recent Labs Lab 03/14/16 2349 03/17/16 1203  LATICACIDVEN 1.11 1.02    Recent Results (from the past 240 hour(s))  Culture, blood (Routine x 2)     Status: None   Collection Time: 03/10/16  4:55 PM  Result Value Ref Range Status   Specimen Description BLOOD PICC LINE  Final   Special Requests BOTTLES DRAWN AEROBIC AND ANAEROBIC 5CC  Final   Culture NO GROWTH 5 DAYS  Final   Report Status 03/15/2016 FINAL  Final  Culture, blood (Routine x 2)     Status: None   Collection Time: 03/10/16  6:08 PM  Result Value Ref Range Status   Specimen Description BLOOD LEFT ANTECUBITAL  Final   Special Requests BOTTLES DRAWN AEROBIC AND ANAEROBIC 5CC  Final   Culture NO GROWTH 5 DAYS  Final   Report Status 03/15/2016 FINAL  Final  MRSA PCR Screening     Status: None    Collection Time: 03/17/16  3:30 PM  Result Value Ref Range Status   MRSA by PCR NEGATIVE NEGATIVE Final    Comment:        The GeneXpert MRSA Assay (FDA approved for NASAL specimens only), is one component of a comprehensive MRSA colonization surveillance program. It is not intended to diagnose  MRSA infection nor to guide or monitor treatment for MRSA infections.          Radiology Studies: No results found.      Scheduled Meds: . cefTRIAXone (ROCEPHIN)  IV  2 g Intravenous Q24H  . Chlorhexidine Gluconate Cloth  6 each Topical Daily  . lidocaine  1 patch Transdermal Q24H  . metoprolol tartrate  12.5 mg Oral BID  . [START ON 03/21/2016] pantoprazole  40 mg Intravenous Q12H  . sodium chloride flush  3 mL Intravenous Q12H   Continuous Infusions: . sodium chloride 100 mL/hr at 03/19/16 1036  . pantoprozole (PROTONIX) infusion 8 mg/hr (03/19/16 1305)     LOS: 2 days    Time spent: 35 minutes.     Elmarie Shiley, MD [facility] Pager [phone]  If 7PM-7AM, please contact night-coverage [email or web address] Password [email or web address] 03/19/2016, 1:37 PM

## 2016-03-20 LAB — TYPE AND SCREEN
ABO/RH(D): B POS
Antibody Screen: NEGATIVE
UNIT DIVISION: 0
UNIT DIVISION: 0
Unit division: 0
Unit division: 0
Unit division: 0
Unit division: 0

## 2016-03-20 LAB — BPAM RBC
Blood Product Expiration Date: 201803282359
Blood Product Expiration Date: 201803302359
Blood Product Expiration Date: 201804022359
Blood Product Expiration Date: 201804022359
Blood Product Expiration Date: 201804032359
Blood Product Expiration Date: 201804042359
ISSUE DATE / TIME: 201802271648
ISSUE DATE / TIME: 201802272358
ISSUE DATE / TIME: 201802280353
ISSUE DATE / TIME: 201802271318
ISSUE DATE / TIME: 201802281747
ISSUE DATE / TIME: 201803011022
UNIT TYPE AND RH: 7300
UNIT TYPE AND RH: 7300
Unit Type and Rh: 7300
Unit Type and Rh: 7300
Unit Type and Rh: 7300
Unit Type and Rh: 7300

## 2016-03-20 LAB — BASIC METABOLIC PANEL
Anion gap: 6 (ref 5–15)
BUN: 6 mg/dL (ref 6–20)
CHLORIDE: 108 mmol/L (ref 101–111)
CO2: 23 mmol/L (ref 22–32)
CREATININE: 0.73 mg/dL (ref 0.61–1.24)
Calcium: 8.1 mg/dL — ABNORMAL LOW (ref 8.9–10.3)
GFR calc non Af Amer: >60 mL/min (ref >60)
Glucose, Bld: 92 mg/dL (ref 65–99)
Potassium: 3.4 mmol/L — ABNORMAL LOW (ref 3.5–5.1)
Sodium: 137 mmol/L (ref 135–145)

## 2016-03-20 LAB — HEMOGLOBIN AND HEMATOCRIT, BLOOD
HCT: 25.5 % — ABNORMAL LOW (ref 39.0–52.0)
Hemoglobin: 8.9 g/dL — ABNORMAL LOW (ref 13.0–17.0)

## 2016-03-20 MED ORDER — FERROUS SULFATE 325 (65 FE) MG PO TABS
325.0000 mg | ORAL_TABLET | Freq: Two times a day (BID) | ORAL | Status: DC
  Administered 2016-03-20 – 2016-03-24 (×8): 325 mg via ORAL
  Filled 2016-03-20 (×7): qty 1

## 2016-03-20 MED ORDER — POTASSIUM CHLORIDE CRYS ER 20 MEQ PO TBCR
40.0000 meq | EXTENDED_RELEASE_TABLET | Freq: Once | ORAL | Status: AC
  Administered 2016-03-20: 40 meq via ORAL
  Filled 2016-03-20: qty 2

## 2016-03-20 MED ORDER — OXYCODONE HCL 5 MG PO TABS
5.0000 mg | ORAL_TABLET | Freq: Four times a day (QID) | ORAL | Status: DC | PRN
  Administered 2016-03-20 – 2016-03-24 (×12): 5 mg via ORAL
  Filled 2016-03-20 (×12): qty 1

## 2016-03-20 NOTE — Evaluation (Signed)
Physical Therapy Evaluation Patient Details Name: Jonathan Holden MRN: [id number] DOB: 11-03-1945 Today's Date: 03/20/2016   History of Present Illness  Pt was admitted for bloody diarrhea. Hgb 7.1. He has had several recent hospitalizations for diverticular bleed, [name], and discitis.  PMH:  CAD, HTN, patient had a fall at home  Clinical Impression  The patient  Ambulated with RW x 150' and min guard. Gait appears steady. The patient reports that he wears the back brace off and on at home. It is not available today. Pt admitted with above diagnosis. Pt currently with functional limitations due to the deficits listed below (see PT Problem List).  Pt will benefit from skilled PT to increase their independence and safety with mobility to allow discharge to the venue listed below.      Follow Up Recommendations Home health PT;Supervision/Assistance - 24 hour    Equipment Recommendations  None recommended by PT    Recommendations for Other Services       Precautions / Restrictions Precautions Precautions: Fall Precaution Comments: pt reports he has a back brace, which he hasn't been wearing Required Braces or Orthoses: Spinal Brace Spinal Brace: Lumbar corset;Applied in sitting position Restrictions Weight Bearing Restrictions: No      Mobility  Bed Mobility Overal bed mobility: Needs Assistance Bed Mobility: Supine to Sit   Sidelying to sit: Min guard       General bed mobility comments: HOB raised  Transfers Overall transfer level: Needs assistance Equipment used: Rolling walker (2 wheeled) Transfers: Sit to/from Stand Sit to Stand: Min guard         General transfer comment: cues for UE placement  Ambulation/Gait Ambulation/Gait assistance: Min assist Ambulation Distance (Feet): 150 Feet Assistive device: Rolling walker (2 wheeled) Gait Pattern/deviations: Step-through pattern     General Gait Details: gait is steady today with RW,  Stairs             Wheelchair Mobility    Modified Rankin (Stroke Patients Only)       Balance Overall balance assessment: Needs assistance Sitting-balance support: No upper extremity supported Sitting balance-Leahy Scale: Good     Standing balance support: During functional activity;Bilateral upper extremity supported Standing balance-Leahy Scale: Fair                               Pertinent Vitals/Pain Pain Assessment: Faces Faces Pain Scale: Hurts even more Pain Location: back Pain Descriptors / Indicators: Aching Pain Intervention(s): Limited activity within patient's tolerance    Home Living Family/patient expects to be discharged to:: Private residence Living Arrangements: Spouse/significant other Available Help at Discharge: Available 24 hours/day Type of Home: House Home Access: Stairs to enter Entrance Stairs-Rails: None Entrance Stairs-Number of Steps: 1+1 Home Layout: Two level;Able to live on main level with bedroom/bathroom Home Equipment: Gilford Rile - 2 wheels;Shower seat;Bedside commode;Wheelchair - manual Additional Comments: DC from CIR 03/09/16    Prior Function Level of Independence: Needs assistance   Gait / Transfers Assistance Needed: uses RW           Hand Dominance        Extremity/Trunk Assessment   Upper Extremity Assessment Upper Extremity Assessment: Defer to OT evaluation    Lower Extremity Assessment Lower Extremity Assessment: Generalized weakness    Cervical / Trunk Assessment Cervical / Trunk Assessment: Normal  Communication      Cognition Arousal/Alertness: Awake/alert Behavior During Therapy: WFL for tasks assessed/performed Overall Cognitive  Status: No family/caregiver present to determine baseline cognitive functioning (patient not clear about the brace use recently)                      General Comments      Exercises     Assessment/Plan    PT Assessment Patient needs continued PT services  PT Problem  List Decreased strength;Decreased activity tolerance;Decreased mobility;Decreased knowledge of precautions;Decreased safety awareness;Decreased knowledge of use of DME;Pain       PT Treatment Interventions Gait training;Functional mobility training;Therapeutic activities;Patient/family education;DME instruction    PT Goals (Current goals can be found in the Care Plan section)  Acute Rehab PT Goals Patient Stated Goal: to return to PLOF PT Goal Formulation: With patient Time For Goal Achievement: 04/03/16 Potential to Achieve Goals: Good    Frequency Min 3X/week   Barriers to discharge        Co-evaluation PT/OT/SLP Co-Evaluation/Treatment: Yes Reason for Co-Treatment: For patient/therapist safety PT goals addressed during session: Mobility/safety with mobility OT goals addressed during session: ADL's and self-care       End of Session Equipment Utilized During Treatment: Gait belt (back brace not available) Activity Tolerance: Patient tolerated treatment well;No increased pain;Patient limited by fatigue Patient left: in chair;with call bell/phone within reach;with chair alarm set;with nursing/sitter in room Nurse Communication: Mobility status PT Visit Diagnosis: History of falling (Z91.81);Muscle weakness (generalized) (M62.81);Difficulty in walking, not elsewhere classified (R26.2)         Time: [id number] PT Time Calculation (min) (ACUTE ONLY): 33 min   Charges:   PT Evaluation $PT Eval Low Complexity: 1 Procedure     PT G CodesClaretha Cooper 03/20/2016, 1:34 PM  Tresa Endo PT [phone]

## 2016-03-20 NOTE — Progress Notes (Signed)
[facility] Gastroenterology Progress Note  LONDELL MADRIL 71 y.o. Apr 12, 1945   Subjective: Feels good. No BMs overnight or this morning.  Objective: Vital signs in last 24 hours: Vitals:   03/20/16 1000 03/20/16 1100  BP: 136/66 121/80  Pulse:    Resp: 20 17  Temp:    T 98.9, P 96  Physical Exam: Gen: alert, no acute distress CV: RRR Chest: CTA B Abd: soft, nontender, nondistended, +BS  Lab Results:  Recent Labs  03/18/16 0800 03/20/16 0417  NA 137 137  K 3.9 3.4*  CL 110 108  CO2 24 23  GLUCOSE 115* 92  BUN 15 6  CREATININE 0.70 0.73  CALCIUM 7.9* 8.1*    Recent Labs  03/17/16 1149  AST 16  ALT 26  ALKPHOS 71  BILITOT 0.4  PROT 5.6*  ALBUMIN 2.4*    Recent Labs  03/17/16 1149  03/18/16 0800  03/19/16 1551 03/20/16 0417  WBC 11.1*  --  9.0  --   --   --   HGB 7.1*  < > 8.5*  < > 8.5* 8.9*  HCT 21.2*  < > 24.5*  < > 24.9* 25.5*  MCV 90.6  --  89.1  --   --   --   PLT 244  --  161  --   --   --   < > = values in this interval not displayed.  Recent Labs  03/17/16 1149  LABPROT 17.9*  INR 1.46      Assessment/Plan: Resolved lower GI bleed - likely diverticular. Hgb stable. Advance diet. F/U with me in April. Will sign off. Call back if questions. Dr. Paulita Fujita on this weekend if necessary.   [place] C. 03/20/2016, 11:13 AM   Pager [phone]  If no answer or after 5 PM call [phone] ID: [name], male   DOB: 08/18/1945, 71 y.o.   MRN: [id number]

## 2016-03-20 NOTE — Evaluation (Signed)
Occupational Therapy Evaluation Patient Details Name: Jonathan Holden MRN: [id number] DOB: 06/19/1945 Today's Date: 03/20/2016    History of Present Illness Pt was admitted for bloody diarrhea. Hgb 7.1. He has had several recent hospitalizations for diverticular bleed, [name], and discitis.  PMH:  CAD, HTN   Clinical Impression   This 71 year old man was admitted for the above.  He will benefit from continued OT to increase safety and independence with adls. Goals in acute are for supervision level overall.  Pt has had several recent hospitalizations and a CIR stay:  Had started HHPT/OT but only had a couple of sessions.  He had not returned to PLOF    Follow Up Recommendations  Home health OT;Supervision/Assistance - 24 hour    Equipment Recommendations  None recommended by OT    Recommendations for Other Services       Precautions / Restrictions Precautions Precautions: Fall Precaution Comments: pt reports he has a back brace, which he hasn't been wearing Required Braces or Orthoses: Spinal Brace Spinal Brace: Lumbar corset;Applied in sitting position Restrictions Weight Bearing Restrictions: No      Mobility Bed Mobility       Sidelying to sit: Min guard       General bed mobility comments: HOB raised  Transfers   Equipment used: Rolling walker (2 wheeled)   Sit to Stand: Min guard         General transfer comment: cues for UE placement    Balance                                            ADL   Eating/Feeding: Set up;Sitting   Grooming: Set up;Sitting   Upper Body Bathing: Set up;Sitting   Lower Body Bathing: Minimal assistance;Sit to/from stand   Upper Body Dressing : Set up;Sitting   Lower Body Dressing: Maximal assistance;Sit to/from stand   Toilet Transfer: Min guard;RW;Ambulation (to chair)             General ADL Comments: pt is able to bring legs up partially to adjust socks.  Reinforced back education       Vision         Perception     Praxis      Pertinent Vitals/Pain Pain Assessment: Faces Faces Pain Scale: Hurts even more Pain Location: back Pain Descriptors / Indicators: Aching Pain Intervention(s): Limited activity within patient's tolerance;Monitored during session;Repositioned     Hand Dominance     Extremity/Trunk Assessment Upper Extremity Assessment Upper Extremity Assessment: Overall WFL for tasks assessed (did not resist due to back pain)           Communication     Cognition   Behavior During Therapy: WFL for tasks assessed/performed Overall Cognitive Status: No family/caregiver present to determine baseline cognitive functioning (wfls during eval; however, ? brace use)                     General Comments       Exercises       Shoulder Instructions      Home Living Family/patient expects to be discharged to:: Private residence Living Arrangements: Spouse/significant other Available Help at Discharge: Available 24 hours/day Type of Home: House Home Access: Stairs to enter [employer] of Steps: 1+1 Entrance Stairs-Rails: None Home Layout: Two level;Able to live on main level with bedroom/bathroom  Bathroom Shower/Tub: Occupational psychologist: Standard Bathroom Accessibility: Yes   Home Equipment: Environmental consultant - 2 wheels;Shower seat;Bedside commode;Wheelchair - manual   Additional Comments: DC from CIR 03/09/16  Lives With: Significant other    Prior Functioning/Environment Level of Independence: Needs assistance  Gait / Transfers Assistance Needed: uses RW              OT Problem List: Decreased strength;Decreased activity tolerance;Pain;Decreased knowledge of use of DME or AE;Decreased knowledge of precautions      OT Treatment/Interventions: Self-care/ADL training;DME and/or AE instruction;Therapeutic activities;Patient/family education;Balance training    OT Goals(Current goals can be found in the  care plan section) Acute Rehab OT Goals Patient Stated Goal: to return to PLOF OT Goal Formulation: With patient Time For Goal Achievement: 03/27/16 Potential to Achieve Goals: Good ADL Goals Pt Will Perform Grooming: with supervision;standing Pt Will Perform Lower Body Bathing: with supervision;sit to/from stand;with adaptive equipment Pt Will Perform Lower Body Dressing: with supervision;sit to/from stand;with adaptive equipment Pt Will Transfer to Toilet: with supervision;ambulating;bedside commode Pt Will Perform Toileting - Clothing Manipulation and hygiene: with supervision;sit to/from stand  OT Frequency: Min 2X/week   Barriers to D/C:            Co-evaluation PT/OT/SLP Co-Evaluation/Treatment: Yes Reason for Co-Treatment: For patient/therapist safety PT goals addressed during session: Mobility/safety with mobility OT goals addressed during session: ADL's and self-care      End of Session    Activity Tolerance: Patient tolerated treatment well Patient left: in chair;with call bell/phone within reach;with chair alarm set                   ADL either performed or assessed with clinical judgement  Time: [id number] OT Time Calculation (min): 33 min Charges:  OT General Charges $OT Visit: 1 Procedure OT Evaluation $OT Eval Moderate Complexity: 1 Procedure G-Codes:     [place], OTR/L [id number] 03/20/2016  Jonathan Holden 03/20/2016, 1:27 PM

## 2016-03-20 NOTE — Progress Notes (Signed)
Pt is active with [facility] if she discharge home. Pt transferred to 4W from ICU.

## 2016-03-20 NOTE — Progress Notes (Signed)
PROGRESS NOTE    Jonathan Holden  [id number] DOB: 05/11/1945 DOA: 03/17/2016 PCP: Sinclair Grooms, MD    Brief Narrative: Jonathan Holden is a 71 y.o. male with medical history significant  for a number of hospitalizations in the last few months including several for diverticular bleeding which have been self-contained as well as A. fib being started on eliquis and a discitis leading to daily IV Rocephin 2 g. The last couple of days, patient noted bloody diarrhea, but because he previously experienced self-contained diverticular bleeds and had come in and was told it was nothing to do, he held off. However symptoms persisted today and he felt a little more lightheaded so he came into the emergency room.   ED Course: In the emergency room, patient's hemoglobin was noted to be 7.1 when 3 days ago it was 12.6. Patient was ordered 2 units packed red blood cells. The rest of his labs were unremarkable. Hospitalists were called for further evaluation.     Assessment & Plan:   Active Problems:   Hyperlipidemia   CAD (coronary artery disease), native coronary artery   Essential hypertension   Atrial fibrillation with RVR (HCC)   Lumbar discitis   Endocarditis of native valve   Acute blood loss anemia   GI bleed   1-Acute blood loss anemia, in setting of GI bleed, diverticular bleed.  He has received 6 units of PRBC.  IV Protonix.  Appreciate Dr Hubbard Hartshorn assistance.  Holding eliquis. Continue to hold at discharge  Hb stable at 8.9/  No further bleeding. Advance diet   2-history of paroxysmal [name];  Holding eliquis due to GI bleed.  Resume metoprolol increased to 25 mg BID.  Had episode yesterday of a fib RVR. Respond to IV metoprolol.   3-Lumbar Discitis;  Continue with IV ceftriaxone.  Patch didn't help. Wants to try tylenol.  Needs IV antibiotics until 04/05/2016 Needs to follow up with Dr Tommy Medal.    DVT prophylaxis: SCD, no anticoagulation in setting of bleeding.    Code Status: Full code.  Family Communication: care discussed with patient.  Disposition Plan: remain in the step down unit, patient with Active GI bleed.   Consultants:   Dr Michail Sermon.    Procedures: none   Antimicrobials:   Ceftriaxone.    Subjective: No further Bloody bowel movement  Feeling well. He was not aware that he has [name]    Objective: Vitals:   03/20/16 0300 03/20/16 0400 03/20/16 0755 03/20/16 0800  BP: 127/84 139/77  136/74  Pulse:      Resp: 19 17  (!) 21  Temp:  98 F (36.7 C) 98.9 F (37.2 C)   TempSrc:  Oral Oral   SpO2: 95% 97%  96%  Weight:      Height:        Intake/Output Summary (Last 24 hours) at 03/20/16 0844 Last data filed at 03/20/16 0800  Gross per 24 hour  Intake             3490 ml  Output             3750 ml  Net             -260 ml   Filed Weights   03/17/16 1126 03/17/16 1600  Weight: 86.2 kg (190 lb) 86.2 kg (190 lb 0.6 oz)    Examination:  General exam: Appears calm and comfortable  Respiratory system: Clear to auscultation. Respiratory effort normal. Cardiovascular system:  S1 & S2 heard, RRR. No JVD, murmurs, rubs, gallops or clicks. No pedal edema. Gastrointestinal system: Abdomen is nondistended, soft and nontender. No organomegaly or masses felt. Normal bowel sounds heard. Central nervous system: Alert and oriented. No focal neurological deficits. Extremities: Symmetric 5 x 5 power. Skin: No rashes, lesions or ulcers     Data Reviewed: I have personally reviewed following labs and imaging studies  CBC:  Recent Labs Lab 03/17/16 1149  03/18/16 0800 03/18/16 1549 03/19/16 0005 03/19/16 0415 03/19/16 1551 03/20/16 0417  WBC 11.1*  --  9.0  --   --   --   --   --   HGB 7.1*  < > 8.5* 7.9* 8.0* 7.7* 8.5* 8.9*  HCT 21.2*  < > 24.5* 22.6* 23.1* 22.1* 24.9* 25.5*  MCV 90.6  --  89.1  --   --   --   --   --   PLT 244  --  161  --   --   --   --   --   < > = values in this interval not displayed. Basic  Metabolic Panel:  Recent Labs Lab 03/14/16 2348 03/17/16 1149 03/18/16 0800 03/20/16 0417  NA 134* 135 137 137  K 4.3 4.2 3.9 3.4*  CL 97* 108 110 108  CO2  --  22 24 23   GLUCOSE 116* 170* 115* 92  BUN 15 21* 15 6  CREATININE 1.00 0.79 0.70 0.73  CALCIUM  --  7.9* 7.9* 8.1*   GFR: Estimated Creatinine Clearance: 98.5 mL/min (by C-G formula based on SCr of 0.73 mg/dL). Liver Function Tests:  Recent Labs Lab 03/17/16 1149  AST 16  ALT 26  ALKPHOS 71  BILITOT 0.4  PROT 5.6*  ALBUMIN 2.4*   No results for input(s): LIPASE, AMYLASE in the last 168 hours. No results for input(s): AMMONIA in the last 168 hours. Coagulation Profile:  Recent Labs Lab 03/17/16 1149  INR 1.46   Cardiac Enzymes: No results for input(s): CKTOTAL, CKMB, CKMBINDEX, TROPONINI in the last 168 hours. BNP (last 3 results) No results for input(s): PROBNP in the last 8760 hours. HbA1C: No results for input(s): HGBA1C in the last 72 hours. CBG: No results for input(s): GLUCAP in the last 168 hours. Lipid Profile: No results for input(s): CHOL, HDL, LDLCALC, TRIG, CHOLHDL, LDLDIRECT in the last 72 hours. Thyroid Function Tests: No results for input(s): TSH, T4TOTAL, FREET4, T3FREE, THYROIDAB in the last 72 hours. Anemia Panel: No results for input(s): VITAMINB12, FOLATE, FERRITIN, TIBC, IRON, RETICCTPCT in the last 72 hours. Sepsis Labs:  Recent Labs Lab 03/14/16 2349 03/17/16 1203  LATICACIDVEN 1.11 1.02    Recent Results (from the past 240 hour(s))  Culture, blood (Routine x 2)     Status: None   Collection Time: 03/10/16  4:55 PM  Result Value Ref Range Status   Specimen Description BLOOD PICC LINE  Final   Special Requests BOTTLES DRAWN AEROBIC AND ANAEROBIC 5CC  Final   Culture NO GROWTH 5 DAYS  Final   Report Status 03/15/2016 FINAL  Final  Culture, blood (Routine x 2)     Status: None   Collection Time: 03/10/16  6:08 PM  Result Value Ref Range Status   Specimen Description  BLOOD LEFT ANTECUBITAL  Final   Special Requests BOTTLES DRAWN AEROBIC AND ANAEROBIC 5CC  Final   Culture NO GROWTH 5 DAYS  Final   Report Status 03/15/2016 FINAL  Final  MRSA PCR Screening  Status: None   Collection Time: 03/17/16  3:30 PM  Result Value Ref Range Status   MRSA by PCR NEGATIVE NEGATIVE Final    Comment:        The GeneXpert MRSA Assay (FDA approved for NASAL specimens only), is one component of a comprehensive MRSA colonization surveillance program. It is not intended to diagnose MRSA infection nor to guide or monitor treatment for MRSA infections.          Radiology Studies: No results found.      Scheduled Meds: . cefTRIAXone (ROCEPHIN)  IV  2 g Intravenous Q24H  . Chlorhexidine Gluconate Cloth  6 each Topical Daily  . lidocaine  1 patch Transdermal Q24H  . metoprolol tartrate  25 mg Oral BID  . [START ON 03/21/2016] pantoprazole  40 mg Intravenous Q12H  . potassium chloride  40 mEq Oral Once  . sodium chloride flush  3 mL Intravenous Q12H   Continuous Infusions: . sodium chloride 100 mL/hr at 03/20/16 0800  . pantoprozole (PROTONIX) infusion 8 mg/hr (03/20/16 0800)     LOS: 3 days    Time spent: 35 minutes.     Elmarie Shiley, MD [facility] Pager [phone]  If 7PM-7AM, please contact night-coverage [email or web address] Password [facility] 03/20/2016, 8:44 AM

## 2016-03-21 LAB — CBC
HCT: 24 % — ABNORMAL LOW (ref 39.0–52.0)
Hemoglobin: 8.3 g/dL — ABNORMAL LOW (ref 13.0–17.0)
MCH: 30.9 pg (ref 26.0–34.0)
MCHC: 34.6 g/dL (ref 30.0–36.0)
MCV: 89.2 fL (ref 78.0–100.0)
Platelets: 166 10*3/uL (ref 150–400)
RBC: 2.69 MIL/uL — ABNORMAL LOW (ref 4.22–5.81)
RDW: 14.7 % (ref 11.5–15.5)
WBC: 8.4 10*3/uL (ref 4.0–10.5)

## 2016-03-21 LAB — BASIC METABOLIC PANEL
ANION GAP: 6 (ref 5–15)
BUN: 10 mg/dL (ref 6–20)
CALCIUM: 8.4 mg/dL — AB (ref 8.9–10.3)
CO2: 24 mmol/L (ref 22–32)
CREATININE: 0.74 mg/dL (ref 0.61–1.24)
Chloride: 106 mmol/L (ref 101–111)
Glucose, Bld: 103 mg/dL — ABNORMAL HIGH (ref 65–99)
Potassium: 3.5 mmol/L (ref 3.5–5.1)
SODIUM: 136 mmol/L (ref 135–145)

## 2016-03-21 NOTE — Progress Notes (Signed)
Case discussed with wife at length over the telephone.  [place] for patient to be discharged home from GI perspective.  I would advise off Eliquis for at least one week if ok with cardiology.  Dr. Michail Sermon will follow-up with patient as outpatient.  Will sign-off; please call with questions.

## 2016-03-21 NOTE — Progress Notes (Signed)
PROGRESS NOTE    Jonathan Holden  [id number] DOB: 01/02/1946 DOA: 03/17/2016 PCP: Sinclair Grooms, MD    Brief Narrative: Jonathan Holden is a 71 y.o. male with medical history significant  for a number of hospitalizations in the last few months including several for diverticular bleeding which have been self-contained as well as A. fib being started on eliquis and a discitis leading to daily IV Rocephin 2 g. The last couple of days, patient noted bloody diarrhea, but because he previously experienced self-contained diverticular bleeds and had come in and was told it was nothing to do, he held off. However symptoms persisted today and he felt a little more lightheaded so he came into the emergency room.   ED Course: In the emergency room, patient's hemoglobin was noted to be 7.1 when 3 days ago it was 12.6. Patient was ordered 2 units packed red blood cells. The rest of his labs were unremarkable. Hospitalists were called for further evaluation.     Assessment & Plan:   Active Problems:   Hyperlipidemia   CAD (coronary artery disease), native coronary artery   Essential hypertension   Atrial fibrillation with RVR (HCC)   Lumbar discitis   Endocarditis of native valve   Acute blood loss anemia   GI bleed   1-Acute blood loss anemia, in setting of GI bleed, diverticular bleed.  He has received 6 units of PRBC.  IV Protonix.  Appreciate Dr Hubbard Hartshorn assistance.  Holding eliquis. Continue to hold at discharge  Hb stable at 8.5 No further bleeding. Tolerating diet. No BM in 24 hours.  Repeat labs in am.  Continue with ferrous sulfate.   2-history of paroxysmal [name];  Holding eliquis due to GI bleed. Will continue to hold at discharge.  Continue with  25 mg BID.  Stable/   3-Lumbar Discitis;  Continue with IV ceftriaxone.  Needs IV antibiotics until 04/05/2016 Needs to follow up with Dr Tommy Medal and Dr Emiliano Dyer.   4-Right Knee Pain and edema.  Dr Emiliano Dyer consulted.  Does patient need arthrocentesis ? Should we be worry about infection do  to recent history of bacteremia and diskitis.    DVT prophylaxis: SCD, no anticoagulation in setting of bleeding.  Code Status: Full code.  Family Communication: care discussed with patient and caretaker. Explain to caretaker about why patient was having GI bleed, explain about diverticular bleed, and that patient is stable.  Disposition Plan: evaluation for right knee pain and monitor overnight.   Consultants:   Dr Michail Sermon.    Procedures: none   Antimicrobials:   Ceftriaxone.    Subjective: No further Bloody bowel movement  He is now complaining of right knee pain, and edema. He think he might twisted yesterday walking with the therapist.    Objective: Vitals:   03/20/16 1330 03/20/16 1842 03/20/16 2049 03/21/16 0700  BP: 111/68 111/64 130/85 123/65  Pulse: 90 78 84 85  Resp: 20 18 18 18   Temp: 97.6 F (36.4 C) 98.3 F (36.8 C) 99.5 F (37.5 C) 98.7 F (37.1 C)  TempSrc: Oral Oral Oral Oral  SpO2: 100% 100% 100% 100%  Weight:      Height:        Intake/Output Summary (Last 24 hours) at 03/21/16 0932 Last data filed at 03/21/16 0904  Gross per 24 hour  Intake             1185 ml  Output  950 ml  Net              235 ml   Filed Weights   03/17/16 1126 03/17/16 1600  Weight: 86.2 kg (190 lb) 86.2 kg (190 lb 0.6 oz)    Examination:  General exam: Appears calm and comfortable  Respiratory system: Clear to auscultation. Respiratory effort normal. Cardiovascular system: S1 & S2 heard, RRR. No JVD, murmurs, rubs, gallops or clicks. No pedal edema. Gastrointestinal system: Abdomen is nondistended, soft and nontender. No organomegaly or masses felt. Normal bowel sounds heard. Central nervous system: Alert and oriented. No focal neurological deficits. Extremities: Symmetric 5 x 5 power. Skin: No rashes, lesions or ulcers     Data Reviewed: I have personally reviewed  following labs and imaging studies  CBC:  Recent Labs Lab 03/17/16 1149  03/18/16 0800  03/19/16 0005 03/19/16 0415 03/19/16 1551 03/20/16 0417 03/21/16 0526  WBC 11.1*  --  9.0  --   --   --   --   --  8.4  HGB 7.1*  < > 8.5*  < > 8.0* 7.7* 8.5* 8.9* 8.3*  HCT 21.2*  < > 24.5*  < > 23.1* 22.1* 24.9* 25.5* 24.0*  MCV 90.6  --  89.1  --   --   --   --   --  89.2  PLT 244  --  161  --   --   --   --   --  166  < > = values in this interval not displayed. Basic Metabolic Panel:  Recent Labs Lab 03/14/16 2348 03/17/16 1149 03/18/16 0800 03/20/16 0417 03/21/16 0526  NA 134* 135 137 137 136  K 4.3 4.2 3.9 3.4* 3.5  CL 97* 108 110 108 106  CO2  --  22 24 23 24   GLUCOSE 116* 170* 115* 92 103*  BUN 15 21* 15 6 10   CREATININE 1.00 0.79 0.70 0.73 0.74  CALCIUM  --  7.9* 7.9* 8.1* 8.4*   GFR: Estimated Creatinine Clearance: 98.5 mL/min (by C-G formula based on SCr of 0.74 mg/dL). Liver Function Tests:  Recent Labs Lab 03/17/16 1149  AST 16  ALT 26  ALKPHOS 71  BILITOT 0.4  PROT 5.6*  ALBUMIN 2.4*   No results for input(s): LIPASE, AMYLASE in the last 168 hours. No results for input(s): AMMONIA in the last 168 hours. Coagulation Profile:  Recent Labs Lab 03/17/16 1149  INR 1.46   Cardiac Enzymes: No results for input(s): CKTOTAL, CKMB, CKMBINDEX, TROPONINI in the last 168 hours. BNP (last 3 results) No results for input(s): PROBNP in the last 8760 hours. HbA1C: No results for input(s): HGBA1C in the last 72 hours. CBG: No results for input(s): GLUCAP in the last 168 hours. Lipid Profile: No results for input(s): CHOL, HDL, LDLCALC, TRIG, CHOLHDL, LDLDIRECT in the last 72 hours. Thyroid Function Tests: No results for input(s): TSH, T4TOTAL, FREET4, T3FREE, THYROIDAB in the last 72 hours. Anemia Panel: No results for input(s): VITAMINB12, FOLATE, FERRITIN, TIBC, IRON, RETICCTPCT in the last 72 hours. Sepsis Labs:  Recent Labs Lab 03/14/16 2349  03/17/16 1203  LATICACIDVEN 1.11 1.02    Recent Results (from the past 240 hour(s))  MRSA PCR Screening     Status: None   Collection Time: 03/17/16  3:30 PM  Result Value Ref Range Status   MRSA by PCR NEGATIVE NEGATIVE Final    Comment:        The GeneXpert MRSA Assay (FDA approved for NASAL specimens  only), is one component of a comprehensive MRSA colonization surveillance program. It is not intended to diagnose MRSA infection nor to guide or monitor treatment for MRSA infections.          Radiology Studies: No results found.      Scheduled Meds: . cefTRIAXone (ROCEPHIN)  IV  2 g Intravenous Q24H  . Chlorhexidine Gluconate Cloth  6 each Topical Daily  . ferrous sulfate  325 mg Oral BID WC  . lidocaine  1 patch Transdermal Q24H  . metoprolol tartrate  25 mg Oral BID  . pantoprazole  40 mg Intravenous Q12H  . sodium chloride flush  3 mL Intravenous Q12H   Continuous Infusions:    LOS: 4 days    Time spent: 35 minutes.     Elmarie Shiley, MD [facility] Pager [phone]  If 7PM-7AM, please contact night-coverage [email or web address] Password [facility] 03/21/2016, 9:32 AM

## 2016-03-21 NOTE — Progress Notes (Signed)
PT Cancellation Note  Patient Details Name: Jonathan Holden MRN: [id number] DOB: Apr 04, 1945   Cancelled Treatment:    Reason Eval/Treat Not Completed: Other (comment)the patient is on phone dealing with his Heating at home. Will check back tomorrow,   Claretha Cooper 03/21/2016, 4:26 PM Tresa Endo PT [phone]

## 2016-03-21 NOTE — Consult Note (Signed)
Reason for Consult:Right Knee and low back pain. Referring Physician: Patient has a history of a Streptococcus septicemia and has been treated by ID. He developed right knee pain yesterday when ambulating with PT.He was admitted by GI because of a GI Bleed secondary to Diverticulitis. He was on Rehab after his DC for his back issue.Dr. Frederic Jericho asked me to see him.  Jonathan Holden is an 71 y.o. male.  HPI:See above  Past Medical History:  Diagnosis Date  . Arthritis HANDS AND RIGHT SHOULDER  . Coronary artery disease CARDIOLOGIST- DR Daneen Schick- LAST VISIT 6 MON AGO--  REQUESTED NOTE, EKG, ECHO, STRESS TEST   PT DENIES S & S  . H/O gastric ulcer   . History of MI (myocardial infarction) 1992  . Hyperlipidemia   . Hypertension   . Loose body of right shoulder   . S/P coronary angioplasty     Past Surgical History:  Procedure Laterality Date  . CARDIOVASCULAR STRESS TEST  08-22-2008---  DR Daneen Schick   NO ISCHEMIA.   Marland Kitchen CORONARY ANGIOPLASTY  1992  . ERCP W/ SPHICTEROTOMY  12-15-2002   AND STONE EXTRACTION  . LAPAROSCOPIC CHOLECYSTECTOMY  12-16-2002  . LUMBAR LAMINECTOMY  20 YRS AGO   L5  . TEE WITHOUT CARDIOVERSION N/A 02/28/2016   Procedure: TRANSESOPHAGEAL ECHOCARDIOGRAM (TEE);  Surgeon: Fay Records, MD;  Location[facility] ENDOSCOPY;  Service: Cardiovascular;  Laterality: N/A;    Family History  Problem Relation Age of Onset  . Other Mother     AGE 50 HEALTHY  . Heart attack Father 12    2 MI  . Emphysema    . Other Brother     HEALTHY    Social History:  reports that he quit smoking about 26 years ago. His smoking use included Cigarettes. He has a 40.00 pack-year smoking history. He has never used smokeless tobacco. He reports that he drinks about 4.2 oz of alcohol per week . He reports that he does not use drugs.  Allergies:  Allergies  Allergen Reactions  . 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   Reaction:  Unknown   . Ciprofloxacin Hives     Medications: I have reviewed the patient's current medications.  Results for orders placed or performed during the hospital encounter of 03/17/16 (from the past 48 hour(s))  Hemoglobin and hematocrit, blood     Status: Abnormal   Collection Time: 03/19/16  3:51 PM  Result Value Ref Range   Hemoglobin 8.5 (L) 13.0 - 17.0 g/dL   HCT 24.9 (L) 39.0 - 52.0 %  Hemoglobin and hematocrit, blood     Status: Abnormal   Collection Time: 03/20/16  4:17 AM  Result Value Ref Range   Hemoglobin 8.9 (L) 13.0 - 17.0 g/dL   HCT 25.5 (L) 39.0 - 88.2 %  Basic metabolic panel     Status: Abnormal   Collection Time: 03/20/16  4:17 AM  Result Value Ref Range   Sodium 137 135 - 145 mmol/L   Potassium 3.4 (L) 3.5 - 5.1 mmol/L   Chloride 108 101 - 111 mmol/L   CO2 23 22 - 32 mmol/L   Glucose, Bld 92 65 - 99 mg/dL   BUN 6 6 - 20 mg/dL   Creatinine, Ser 0.73 0.61 - 1.24 mg/dL   Calcium 8.1 (L) 8.9 - 10.3 mg/dL   GFR calc non Af Amer >60 >60 mL/min   GFR calc Af Amer >60 >60 mL/min  Comment: (NOTE) The eGFR has been calculated using the CKD EPI equation. This calculation has not been validated in all clinical situations. eGFR's persistently <60 mL/min signify possible Chronic Kidney Disease.    Anion gap 6 5 - 15  CBC     Status: Abnormal   Collection Time: 03/21/16  5:26 AM  Result Value Ref Range   WBC 8.4 4.0 - 10.5 K/uL   RBC 2.69 (L) 4.22 - 5.81 MIL/uL   Hemoglobin 8.3 (L) 13.0 - 17.0 g/dL   HCT 24.0 (L) 39.0 - 52.0 %   MCV 89.2 78.0 - 100.0 fL   MCH 30.9 26.0 - 34.0 pg   MCHC 34.6 30.0 - 36.0 g/dL   RDW 14.7 11.5 - 15.5 %   Platelets 166 150 - 400 K/uL  Basic metabolic panel     Status: Abnormal   Collection Time: 03/21/16  5:26 AM  Result Value Ref Range   Sodium 136 135 - 145 mmol/L   Potassium 3.5 3.5 - 5.1 mmol/L   Chloride 106 101 - 111 mmol/L   CO2 24 22 - 32 mmol/L   Glucose, Bld 103 (H) 65 - 99 mg/dL   BUN 10 6 - 20 mg/dL   Creatinine, Ser 0.74 0.61 - 1.24 mg/dL    Calcium 8.4 (L) 8.9 - 10.3 mg/dL   GFR calc non Af Amer >60 >60 mL/min   GFR calc Af Amer >60 >60 mL/min    Comment: (NOTE) The eGFR has been calculated using the CKD EPI equation. This calculation has not been validated in all clinical situations. eGFR's persistently <60 mL/min signify possible Chronic Kidney Disease.    Anion gap 6 5 - 15    No results found.  Review of Systems  Constitutional: Negative.   HENT: Negative.   Eyes: Negative.   Respiratory: Negative.   Cardiovascular: Positive for leg swelling.  Gastrointestinal: Positive for blood in stool.  Genitourinary: Negative.   Musculoskeletal: Positive for back pain and joint pain.  Skin: Negative.   Neurological: Negative.   Endo/Heme/Allergies: Negative.   Psychiatric/Behavioral: Negative.    Blood pressure 123/65, pulse 85, temperature 98.7 F (37.1 C), temperature source Oral, resp. rate 18, height _0  (1.88 m), weight 86.2 kg (190 lb 0.6 oz), SpO2 100 %. Physical Exam  Constitutional: He appears well-developed.  HENT:  Head: Normocephalic.  Eyes: Pupils are equal, round, and reactive to light.  Neck: Normal range of motion.  Cardiovascular:  Atrial Fibrillation  Musculoskeletal: He exhibits tenderness.  Back and Right Knee Pain  Neurological: He is alert.  Skin: Skin is warm.  Psychiatric: He has a normal mood and affect.    Assessment/Plan: Knne Immobilizer and walker full Weight bearing. Office Tuesday as informed for Knee Xrays and further Evaluation. Note,there are NO signs of a Knee infection. He is Afebrile and his WBC is Normal.  Ayesha Markwell A 03/21/2016, 10:35 AM

## 2016-03-22 DIAGNOSIS — E785 Hyperlipidemia, unspecified: Secondary | ICD-10-CM

## 2016-03-22 DIAGNOSIS — I10 Essential (primary) hypertension: Secondary | ICD-10-CM

## 2016-03-22 DIAGNOSIS — I2510 Atherosclerotic heart disease of native coronary artery without angina pectoris: Secondary | ICD-10-CM

## 2016-03-22 DIAGNOSIS — I480 Paroxysmal atrial fibrillation: Secondary | ICD-10-CM

## 2016-03-22 LAB — HEMOGLOBIN AND HEMATOCRIT, BLOOD
HEMATOCRIT: 25.8 % — AB (ref 39.0–52.0)
HEMOGLOBIN: 8.7 g/dL — AB (ref 13.0–17.0)

## 2016-03-22 LAB — BASIC METABOLIC PANEL
Anion gap: 8 (ref 5–15)
BUN: 10 mg/dL (ref 6–20)
CALCIUM: 8.4 mg/dL — AB (ref 8.9–10.3)
CHLORIDE: 100 mmol/L — AB (ref 101–111)
CO2: 24 mmol/L (ref 22–32)
CREATININE: 0.66 mg/dL (ref 0.61–1.24)
GFR calc non Af Amer: >60 mL/min (ref >60)
Glucose, Bld: 107 mg/dL — ABNORMAL HIGH (ref 65–99)
Potassium: 3.4 mmol/L — ABNORMAL LOW (ref 3.5–5.1)
SODIUM: 132 mmol/L — AB (ref 135–145)

## 2016-03-22 LAB — PHOSPHORUS: PHOSPHORUS: 4.1 mg/dL (ref 2.5–4.6)

## 2016-03-22 LAB — MAGNESIUM: Magnesium: 1.7 mg/dL (ref 1.7–2.4)

## 2016-03-22 MED ORDER — MAGNESIUM SULFATE 2 GM/50ML IV SOLN
2.0000 g | Freq: Once | INTRAVENOUS | Status: AC
  Administered 2016-03-22: 2 g via INTRAVENOUS
  Filled 2016-03-22: qty 50

## 2016-03-22 MED ORDER — SODIUM CHLORIDE 0.9 % IV SOLN
INTRAVENOUS | Status: DC
  Administered 2016-03-22 – 2016-03-23 (×2): via INTRAVENOUS

## 2016-03-22 MED ORDER — METOPROLOL TARTRATE 25 MG PO TABS
25.0000 mg | ORAL_TABLET | Freq: Two times a day (BID) | ORAL | Status: DC
  Administered 2016-03-22: 25 mg via ORAL
  Filled 2016-03-22: qty 1

## 2016-03-22 MED ORDER — ENSURE ENLIVE PO LIQD
237.0000 mL | Freq: Two times a day (BID) | ORAL | Status: DC
  Administered 2016-03-22 – 2016-03-24 (×5): 237 mL via ORAL

## 2016-03-22 MED ORDER — SACCHAROMYCES BOULARDII 250 MG PO CAPS
250.0000 mg | ORAL_CAPSULE | Freq: Two times a day (BID) | ORAL | Status: DC
  Administered 2016-03-22 – 2016-03-24 (×5): 250 mg via ORAL
  Filled 2016-03-22 (×5): qty 1

## 2016-03-22 MED ORDER — METOPROLOL TARTRATE 50 MG PO TABS: 50.0000 mg | ORAL_TABLET | Freq: Two times a day (BID) | ORAL | Status: DC

## 2016-03-22 MED ORDER — POTASSIUM CHLORIDE CRYS ER 20 MEQ PO TBCR
40.0000 meq | EXTENDED_RELEASE_TABLET | Freq: Once | ORAL | Status: AC
  Administered 2016-03-22: 40 meq via ORAL
  Filled 2016-03-22: qty 2

## 2016-03-22 MED ORDER — METOPROLOL TARTRATE 50 MG PO TABS
50.0000 mg | ORAL_TABLET | Freq: Two times a day (BID) | ORAL | Status: DC
  Administered 2016-03-22 – 2016-03-24 (×4): 50 mg via ORAL
  Filled 2016-03-22 (×4): qty 1

## 2016-03-22 MED ORDER — METOPROLOL TARTRATE 25 MG PO TABS: 25.0000 mg | ORAL_TABLET | Freq: Two times a day (BID) | ORAL | Status: DC

## 2016-03-22 MED ORDER — POTASSIUM CHLORIDE CRYS ER 20 MEQ PO TBCR
40.0000 meq | EXTENDED_RELEASE_TABLET | Freq: Once | ORAL | Status: DC
  Filled 2016-03-22: qty 2

## 2016-03-22 NOTE — Progress Notes (Signed)
Patient ID: Jonathan Holden, male   DOB: [birth year], 71 y.o.   MRN: [id number] The patient had heart rate in the 130's with Afib rhythm. The patient was sleeping and asymptomatic. After metoprolol 5 mg IVP was given the HR decreased to the 90s and he converted back to sinus rhythm. He is currently sleeping. I will check potassium and magnesium with next blood draw.   Tennis Must, MD [phone].

## 2016-03-22 NOTE — Progress Notes (Signed)
    Subjective:    Patient reports pain as 8 on 0-10 scale.   Denies CP or SOB.  Voiding without difficulty. Positive flatus.  Pt in hospital for Diverticulitis.  Pt also has Afib.  Pt reports having chronic knee pain from an old football injury in high school.  It has significantly increased over the last few days.  Objective: Vital signs in last 24 hours: Temp:  [98.3 F (36.8 C)-98.6 F (37 C)] 98.6 F (37 C) (03/04 0416) Pulse Rate:  [95-120] 95 (03/04 0416) Resp:  [14-18] 18 (03/04 0416) BP: (111-127)/(71-82) 115/71 (03/04 0416) SpO2:  [97 %-98 %] 97 % (03/04 0416)  Intake/Output from previous day: 03/03 0701 - 03/04 0700 In: 70 [I.V.:20; IV Piggyback:50] Out: 1800 [Urine:1800] Intake/Output this shift: No intake/output data recorded.  Labs:  Recent Labs  03/19/16 1551 03/20/16 0417 03/21/16 0526 03/22/16 0406  HGB 8.5* 8.9* 8.3* 8.7*    Recent Labs  03/21/16 0526 03/22/16 0406  WBC 8.4  --   RBC 2.69*  --   HCT 24.0* 25.8*  PLT 166  --     Recent Labs  03/21/16 0526 03/22/16 0411  NA 136 132*  K 3.5 3.4*  CL 106 100*  CO2 24 24  BUN 10 10  CREATININE 0.74 0.66  GLUCOSE 103* 107*  CALCIUM 8.4* 8.4*   No results for input(s): LABPT, INR in the last 72 hours.  Physical Exam: Neurologically intact ABD soft Sensation intact distally Dorsiflexion/Plantar flexion intact Compartment soft  Assessment/Plan:    Advance diet Up with therapy  Pt will present to clinic for knee work up with Dr. Matilde Haymaker on Tuesday  Tyreece Gelles, Darla Lesches for Dr. Melina Schools [facility] [phone] 03/22/2016, 8:36 AM    Patient ID: Jonathan Holden, male   DOB: 02/29/1945, 71 y.o.   MRN: [id number]

## 2016-03-22 NOTE — Progress Notes (Signed)
PROGRESS NOTE    Jonathan Holden  [id number] DOB: [birth year] DOA: 03/17/2016 PCP: Sinclair Grooms, MD    Brief Narrative: Jonathan Holden is a 71 y.o. male with medical history significant  for a number of hospitalizations in the last few months including several for diverticular bleeding which have been self-contained as well as A. fib being started on eliquis and a discitis leading to daily IV Rocephin 2 g. The last couple of days, patient noted bloody diarrhea, but because he previously experienced self-contained diverticular bleeds and had come in and was told it was nothing to do, he held off. However symptoms persisted today and he felt a little more lightheaded so he came into the emergency room.   ED Course: In the emergency room, patient's hemoglobin was noted to be 7.1 when 3 days ago it was 12.6. Patient was ordered 2 units packed red blood cells. The rest of his labs were unremarkable. Hospitalists were called for further evaluation.     Assessment & Plan:   Active Problems:   Hyperlipidemia   CAD (coronary artery disease), native coronary artery   Essential hypertension   Atrial fibrillation with RVR (HCC)   Lumbar discitis   Endocarditis of native valve   Acute blood loss anemia   GI bleed   1-Acute blood loss anemia, in setting of GI bleed, diverticular bleed.  He has received 6 units of PRBC.  IV Protonix.  Appreciate Dr Hubbard Hartshorn assistance.  Holding eliquis. Continue to hold at discharge  No further bleeding. Tolerating diet. No BM in 24 hours.  Continue with ferrous sulfate.  Hb stable.   2-history of paroxysmal [name];  Holding eliquis due to GI bleed. Will continue to hold at discharge.  Continue with  25 mg BID.  Patient develops episode of [name] RVR last night then converted to sinus. Had another episode this morning. Cardiology consulted.    3-Lumbar Discitis;  Continue with IV ceftriaxone.  Needs IV antibiotics until 04/05/2016 Needs to  follow up with Dr Tommy Medal and Dr Emiliano Dyer.   4-Right Knee Pain and edema.  Dr Emiliano Dyer consulted.  Pain stable.  Follow up with ortho outpatient.   DVT prophylaxis: SCD, no anticoagulation in setting of bleeding.  Code Status: Full code.  Family Communication: care discussed with patient and caretaker. Explain to caretaker about why patient was having GI bleed, explain about diverticular bleed, and that patient is stable.  Disposition Plan: evaluation for right knee pain and monitor overnight.   Consultants:   Dr Michail Sermon.    Procedures: none   Antimicrobials:   Ceftriaxone.    Subjective: Feeling ok, right knee pain and swelling is not worse.  Denies dyspnea.   Objective: Vitals:   03/21/16 2247 03/22/16 0146 03/22/16 0237 03/22/16 0416  BP: 122/80 111/82  115/71  Pulse: 97 (!) 120 95 95  Resp: 14   18  Temp: 98.6 F (37 C)   98.6 F (37 C)  TempSrc: Oral   Oral  SpO2: 98%   97%  Weight:      Height:        Intake/Output Summary (Last 24 hours) at 03/22/16 1338 Last data filed at 03/22/16 0640  Gross per 24 hour  Intake               60 ml  Output             1500 ml  Net            -  1440 ml   Filed Weights   03/17/16 1126 03/17/16 1600  Weight: 86.2 kg (190 lb) 86.2 kg (190 lb 0.6 oz)    Examination:  General exam: Appears calm and comfortable  Respiratory system: Clear to auscultation. Respiratory effort normal. Cardiovascular system: S1 & S2 heard, RRR. No JVD, murmurs, rubs, gallops or clicks. No pedal edema. Gastrointestinal system: Abdomen is nondistended, soft and nontender. No organomegaly or masses felt. Normal bowel sounds heard. Central nervous system: Alert and oriented. No focal neurological deficits. Extremities: Symmetric 5 x 5 power. Skin: No rashes, lesions or ulcers     Data Reviewed: I have personally reviewed following labs and imaging studies  CBC:  Recent Labs Lab 03/17/16 1149  03/18/16 0800  03/19/16 0415  03/19/16 1551 03/20/16 0417 03/21/16 0526 03/22/16 0406  WBC 11.1*  --  9.0  --   --   --   --  8.4  --   HGB 7.1*  < > 8.5*  < > 7.7* 8.5* 8.9* 8.3* 8.7*  HCT 21.2*  < > 24.5*  < > 22.1* 24.9* 25.5* 24.0* 25.8*  MCV 90.6  --  89.1  --   --   --   --  89.2  --   PLT 244  --  161  --   --   --   --  166  --   < > = values in this interval not displayed. Basic Metabolic Panel:  Recent Labs Lab 03/17/16 1149 03/18/16 0800 03/20/16 0417 03/21/16 0526 03/22/16 0406 03/22/16 0411  NA 135 137 137 136  --  132*  K 4.2 3.9 3.4* 3.5  --  3.4*  CL 108 110 108 106  --  100*  CO2 22 24 23 24   --  24  GLUCOSE 170* 115* 92 103*  --  107*  BUN 21* 15 6 10   --  10  CREATININE 0.79 0.70 0.73 0.74  --  0.66  CALCIUM 7.9* 7.9* 8.1* 8.4*  --  8.4*  MG  --   --   --   --   --  1.7  PHOS  --   --   --   --  4.1  --    GFR: Estimated Creatinine Clearance: 98.5 mL/min (by C-G formula based on SCr of 0.66 mg/dL). Liver Function Tests:  Recent Labs Lab 03/17/16 1149  AST 16  ALT 26  ALKPHOS 71  BILITOT 0.4  PROT 5.6*  ALBUMIN 2.4*   No results for input(s): LIPASE, AMYLASE in the last 168 hours. No results for input(s): AMMONIA in the last 168 hours. Coagulation Profile:  Recent Labs Lab 03/17/16 1149  INR 1.46   Cardiac Enzymes: No results for input(s): CKTOTAL, CKMB, CKMBINDEX, TROPONINI in the last 168 hours. BNP (last 3 results) No results for input(s): PROBNP in the last 8760 hours. HbA1C: No results for input(s): HGBA1C in the last 72 hours. CBG: No results for input(s): GLUCAP in the last 168 hours. Lipid Profile: No results for input(s): CHOL, HDL, LDLCALC, TRIG, CHOLHDL, LDLDIRECT in the last 72 hours. Thyroid Function Tests: No results for input(s): TSH, T4TOTAL, FREET4, T3FREE, THYROIDAB in the last 72 hours. Anemia Panel: No results for input(s): VITAMINB12, FOLATE, FERRITIN, TIBC, IRON, RETICCTPCT in the last 72 hours. Sepsis Labs:  Recent Labs Lab  03/17/16 1203  LATICACIDVEN 1.02    Recent Results (from the past 240 hour(s))  MRSA PCR Screening     Status: None   Collection Time:  03/17/16  3:30 PM  Result Value Ref Range Status   MRSA by PCR NEGATIVE NEGATIVE Final    Comment:        The GeneXpert MRSA Assay (FDA approved for NASAL specimens only), is one component of a comprehensive MRSA colonization surveillance program. It is not intended to diagnose MRSA infection nor to guide or monitor treatment for MRSA infections.          Radiology Studies: No results found.      Scheduled Meds: . cefTRIAXone (ROCEPHIN)  IV  2 g Intravenous Q24H  . Chlorhexidine Gluconate Cloth  6 each Topical Daily  . ferrous sulfate  325 mg Oral BID WC  . lidocaine  1 patch Transdermal Q24H  . metoprolol tartrate  25 mg Oral BID  . pantoprazole  40 mg Intravenous Q12H  . sodium chloride flush  3 mL Intravenous Q12H   Continuous Infusions:    LOS: 5 days    Time spent: 35 minutes.     Elmarie Shiley, MD [facility] Pager [phone]  If 7PM-7AM, please contact night-coverage [email or web address] Password [email or web address] 03/22/2016, 1:38 PM

## 2016-03-22 NOTE — Consult Note (Signed)
CARDIOLOGY CONSULT NOTE     Primary Care Physician: Sinclair Grooms, MD Referring Physician: Lezlie Lye Date: 03/17/2016  Reason for consultation: atrial fibrillation  Jonathan Holden is a 71 y.o. male with a h/o a number of hospitalizations in the last few months including several for diverticular bleeding which have been self-contained as well as A. fib being started on eliquis and a discitis leading to daily IV Rocephin 2 g. The patient presented to the emergency room on 2/27 complaining of bloody diarrhea. In the emergency room, his hemoglobin was noted to be 7.1 down from 12.6, 3 days prior. He ultimately received 6 units of packed red cells. The patient has been in and out of atrial fibrillation with rapid rates since hospitalization. He was started on eliquis in February for atrial fibrillation. Of note the patient does not remember this diagnosis.    Today, he denies symptoms of palpitations, chest pain, shortness of breath, orthopnea, PND, lower extremity edema, presyncope, syncope, or neurologic sequela. He tried to get out of bed this afternoon, and felt dizzy and weak. His only major complaint is of right knee pain.  Past Medical History:  Diagnosis Date  . Arthritis HANDS AND RIGHT SHOULDER  . Coronary artery disease CARDIOLOGIST- DR Daneen Schick- LAST VISIT 6 MON AGO--  REQUESTED NOTE, EKG, ECHO, STRESS TEST   PT DENIES S & S  . H/O gastric ulcer   . History of MI (myocardial infarction) 1992  . Hyperlipidemia   . Hypertension   . Loose body of right shoulder   . S/P coronary angioplasty    Past Surgical History:  Procedure Laterality Date  . CARDIOVASCULAR STRESS TEST  08-22-2008---  DR Daneen Schick   NO ISCHEMIA.   Marland Kitchen CORONARY ANGIOPLASTY  1992  . ERCP W/ SPHICTEROTOMY  12-15-2002   AND STONE EXTRACTION  . LAPAROSCOPIC CHOLECYSTECTOMY  12-16-2002  . LUMBAR LAMINECTOMY  20 YRS AGO   L5  . TEE WITHOUT CARDIOVERSION N/A 02/28/2016   Procedure: TRANSESOPHAGEAL  ECHOCARDIOGRAM (TEE);  Surgeon: Fay Records, MD;  Location: [facility] [facility];  Service: Cardiovascular;  Laterality: N/A;    . cefTRIAXone (ROCEPHIN)  IV  2 g Intravenous Q24H  . Chlorhexidine Gluconate Cloth  6 each Topical Daily  . feeding supplement (ENSURE ENLIVE)  237 mL Oral BID BM  . ferrous sulfate  325 mg Oral BID WC  . lidocaine  1 patch Transdermal Q24H  . metoprolol tartrate  25 mg Oral BID  . pantoprazole  40 mg Intravenous Q12H  . saccharomyces boulardii  250 mg Oral BID  . sodium chloride flush  3 mL Intravenous Q12H     Allergies  Allergen Reactions  . 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   Reaction:  Unknown   . Ciprofloxacin Hives    Social History   Social History  . Marital status: Divorced    Spouse name: N/A  . Number of children: N/A  . Years of education: N/A   Occupational History  . Not on file.   Social History Main Topics  . Smoking status: Former Smoker    Packs/day: 2.00    Years: 20.00    Types: Cigarettes    Quit date: 03/27/1990  . Smokeless tobacco: Never Used  . Alcohol use 4.2 oz/week    7 Glasses of wine per week  . Drug use: No  . Sexual activity: Not on file   Other Topics Concern  .  Not on file   Social History Narrative  . No narrative on file    Family History  Problem Relation Age of Onset  . Other Mother     AGE 31 HEALTHY  . Heart attack Father 29    2 MI  . Emphysema    . Other Brother     HEALTHY    ROS- All systems are reviewed and negative except as per the HPI above  Physical Exam: Telemetry: Vitals:   03/22/16 0146 03/22/16 0237 03/22/16 0416 03/22/16 1439  BP: 111/82  115/71 114/62  Pulse: (!) 120 95 95 91  Resp:   18 18  Temp:   98.6 F (37 C) 99 F (37.2 C)  TempSrc:   Oral Oral  SpO2:   97% 99%  Weight:      Height:        GEN- The patient is well appearing, alert and oriented x 3 today.   Head- normocephalic, atraumatic Eyes-  Sclera clear, conjunctiva pink Ears-  hearing intact Oropharynx- clear Neck- supple, no JVP Lymph- no cervical lymphadenopathy Lungs- Clear to ausculation bilaterally, normal work of breathing Heart- iRRR, no murmurs, rubs or gallops, PMI not laterally displaced GI- soft, NT, ND, + BS Extremities- no clubbing, cyanosis, or edema MS- no significant deformity or atrophy Skin- no rash or lesion Psych- euthymic mood, full affect Neuro- strength and sensation are intact  EKG: atrial fibrillation  Labs:   Lab Results  Component Value Date   WBC 8.4 03/21/2016   HGB 8.7 (L) 03/22/2016   HCT 25.8 (L) 03/22/2016   MCV 89.2 03/21/2016   PLT 166 03/21/2016    Recent Labs Lab 03/17/16 1149  03/22/16 0411  NA 135  < > 132*  K 4.2  < > 3.4*  CL 108  < > 100*  CO2 22  < > 24  BUN 21*  < > 10  CREATININE 0.79  < > 0.66  CALCIUM 7.9*  < > 8.4*  PROT 5.6*  --   --   BILITOT 0.4  --   --   ALKPHOS 71  --   --   ALT 26  --   --   AST 16  --   --   GLUCOSE 170*  < > 107*  < > = values in this interval not displayed. Lab Results  Component Value Date   TROPONINI <0.03 03/11/2016    Lab Results  Component Value Date   CHOL 125 10/22/2014   CHOL 84 06/07/2014   CHOL 110 12/06/2013   Lab Results  Component Value Date   HDL 55.30 10/22/2014   HDL 37.60 (L) 06/07/2014   HDL 48.80 12/06/2013   Lab Results  Component Value Date   LDLCALC 48 10/22/2014   LDLCALC 16 06/07/2014   LDLCALC 40 12/06/2013   Lab Results  Component Value Date   TRIG 111.0 10/22/2014   TRIG 150.0 (H) 06/07/2014   TRIG 104.0 12/06/2013   Lab Results  Component Value Date   CHOLHDL 2 10/22/2014   CHOLHDL 2 06/07/2014   CHOLHDL 2 12/06/2013   Lab Results  Component Value Date   LDLDIRECT 143.5 12/06/2012      Radiology: Stable cardiomegaly, no acute pulmonary process.  Retracted PICC distal tip now projects in proximal superior vena cava.  Echo: - Left ventricle: The cavity size was normal. Wall thickness was   increased  in a pattern of mild LVH. The estimated ejection   fraction was  55%. Wall motion was normal; there were no regional   wall motion abnormalities. Doppler parameters are consistent with   abnormal left ventricular relaxation (grade 1 diastolic   dysfunction). - Aortic valve: There was no stenosis. There was trivial   regurgitation. - Mitral valve: There was no significant regurgitation. - Right ventricle: The cavity size was normal. Systolic function   was normal. - Pulmonary arteries: No complete TR doppler jet so unable to   estimate PA systolic pressure. - Inferior vena cava: The vessel was normal in size. The   respirophasic diameter changes were in the normal range (= 50%),   consistent with normal central venous pressure.  ASSESSMENT AND PLAN:   1. Paroxysmal atrial fibrillation: It appears that he has been in atrial fibrillation for the last month or so. He has had a quite complicated month with multiple infections, back issues, GI bleeding from diverticuli. It is possible that his atrial fibrillation is due to a reactive cause from his other issues. His left atrium is not significantly enlarged. Unfortunately, he is unable to tolerate anticoagulation with his recent GI bleed. We'll therefore pursue a rate control strategy. He is currently on metoprolol 25 mg twice a day. We'll increase that to 50 mg. His goal heart rate is between 90 and 110 at rest. He does have follow-up in cardiology clinic in 4 days. Should his heart rate remained stable, would be okay for discharge.  This patients CHA2DS2-VASc Score and unadjusted Ischemic Stroke Rate (% per year) is equal to 3.2 % stroke rate/year from a score of 3  Above score calculated as 1 point each if present [CHF, HTN, DM, Vascular=MI/PAD/Aortic Plaque, Age if 65-74, or Male] Above score calculated as 2 points each if present [Age > 75, or Stroke/TIA/TE]  2. Hypertension: BP has been well controlled  3. Hyperlipidemia: continue  current managment  4. Coronary artery disease: no current chest pain. Continue atorvastatin, metoprolol. Avoiding antiplatelets as having recurrent GI bleeding.   Felipa Laroche Meredith Leeds, MD 03/22/2016  3:13 PM

## 2016-03-22 NOTE — Care Management Note (Signed)
Case Management Note  Patient Details  Name: Jonathan Holden MRN: [id number] Date of Birth: May 30, 1945  Subjective/Objective:   GI Bleed, afib, Lumbar Discitis                 Action/Plan: Discharge Planning: Chart reviewed. Pt active with [facility] RN for IV abx, PT, OT, ST and aide. Pt has decided to dc to home with HH. CM will continue to follow for dc needs.    PCP Belva Crome  Expected Discharge Date:               Expected Discharge Plan:  [place]  In-House Referral:  Clinical Social Work  Discharge planning Services  CM Consult  Post Acute Care Choice:  Home Health, Resumption of Svcs/PTA Provider Choice offered to:  Patient  DME Arranged:  N/A DME Agency:  NA  HH Arranged:  RN, PT, IV Antibiotics, Speech Therapy HH Agency:  [facility]  Status of Service:  In process, will continue to follow  If discussed at Long Length of Stay Meetings, dates discussed:    Additional Comments:  Erenest Rasher, RN 03/22/2016, 5:43 PM

## 2016-03-22 NOTE — Progress Notes (Signed)
Physical Therapy Treatment Patient Details Name: Jonathan Holden MRN: [id number] DOB: 09-12-1945 Today's Date: 03/22/2016    History of Present Illness Pt was admitted for bloody diarrhea. Hgb 7.1. He has had several recent hospitalizations for diverticular bleed, [name], and discitis.  PMH:  CAD, HTN    PT Comments    Since last PT session in which pt ambulated 150' with RW, he has had new onset of R knee pain. KI now in room and pt scheduled for outpatient appointment on 03/24/16 with Dr. Gladstone Holden.  Pt with 6/10 R knee pain and MOD A to come to EOB, where he questioned purpose of getting up into the bed, and why couldn't he just return supine.  Educated on importance of transfer training as he may d/c today or tomorrow.  Pt reports "I'm deteriorating here. I haven't been up in 4 days."  Once again, educated on that was why PT was present and that was why he needed to get up to recliner and attempt transfer.  While at EOB, discussed with pt different transfer approaches and he sat on EOB at least 10 minutes. When transfer was about to be performed, he reported feeling dizzy.  HR 117. PT got dynamap to take BP.  While BP being taken, he stated he had to return supine and had to uncuff BP. Cardiologist in to speak with pt.  After MD left, returned to room to re-position pt and he again did not seem to connect purpose of PT and importance of mobility with goal of returning home.  He has questionable insight into his current deficits, although is alert and oriented.  If pt is d/c today, would need ambulance transfer as he has steps to enter.  Pt would benefit from continued acute care PT services.   Follow Up Recommendations  Home health PT;Supervision/Assistance - 24 hour     Equipment Recommendations  None recommended by PT    Recommendations for Other Services       Precautions / Restrictions Precautions Precautions: Fall Precaution Comments: pt reports he has a back brace, which he hasn't  been wearing Required Braces or Orthoses: Knee Immobilizer - Right Knee Immobilizer - Right: Other (comment) (now in room due to R knee pain) Restrictions Weight Bearing Restrictions: No    Mobility  Bed Mobility Overal bed mobility: Needs Assistance Bed Mobility: Supine to Sit Rolling: Mod assist Sidelying to sit: Mod assist Supine to sit: Mod assist     General bed mobility comments: MOD A given for R LE due to pain. Very slow transfer with pt leading.  Transfers                 General transfer comment: Pt sat at EOB and was educated on different ways to transfer and discussed home DME.  Pt wanting to attempt an unsafe transfer with RW to his side.  Eudcated again on safe techniques and then he began to feel dizzy.  HR 117 and went and got dynamap.  While BP was being taken he had to return supine and unable to get reading as it had to be uncuffed to return supine and  then cardiologist also walked ing  Ambulation/Gait                 Stairs            Wheelchair Mobility    Modified Rankin (Stroke Patients Only)       Balance Overall balance assessment: Needs assistance Sitting-balance  support: No upper extremity supported Sitting balance-Leahy Scale: Fair Sitting balance - Comments: Sat EOB for 10 mins prior to feeling dizzy                            Cognition Arousal/Alertness: Awake/alert Behavior During Therapy: WFL for tasks assessed/performed Overall Cognitive Status: Within Functional Limits for tasks assessed Area of Impairment: Safety/judgement         Safety/Judgement: Decreased awareness of safety;Decreased awareness of deficits     General Comments: Pt talks a lot about what he needs to do, but does not seem to connect PT's role with these tasks    Exercises      General Comments        Pertinent Vitals/Pain Pain Score: 6  Pain Location: R LE Pain Descriptors / Indicators: Sore Pain Intervention(s):  Limited activity within patient's tolerance;Monitored during session;Premedicated before session;Repositioned    Home Living                      Prior Function            PT Goals (current goals can now be found in the care plan section) Acute Rehab PT Goals Patient Stated Goal: to return to PLOF PT Goal Formulation: With patient Time For Goal Achievement: 04/03/16 Potential to Achieve Goals: Fair Progress towards PT goals: Not progressing toward goals - comment (R knee pain)    Frequency    Min 3X/week      PT Plan Current plan remains appropriate    Co-evaluation             End of Session Equipment Utilized During Treatment: Gait belt Activity Tolerance: Patient limited by pain Patient left: in bed;with call bell/phone within reach;with family/visitor present Nurse Communication: Mobility status PT Visit Diagnosis: Muscle weakness (generalized) (M62.81);Pain;Dizziness and giddiness (R42) Pain - Right/Left: Right Pain - part of body: Knee     Time: [id number] (no charge for time cardiologist in room) PT Time Calculation (min) (ACUTE ONLY): 57 min  Charges:  $Therapeutic Activity: 23-37 mins                    G Codes:       Jonathan Holden 03/22/2016, 5:00 PM

## 2016-03-22 NOTE — Progress Notes (Signed)
OT Cancellation Note  Patient Details Name: Jonathan Holden MRN: [id number] DOB: 11-04-1945   Cancelled Treatment:    Reason Eval/Treat Not Completed: Medical issues which prohibited therapy. Spoke with nursing and she requested OT check back on pt at a later time. Note HR fluctuating between 110 and low 120s at rest. Will reattempt later time.  Pauline Aus Onton 03/22/2016, 11:45 AM

## 2016-03-23 ENCOUNTER — Inpatient Hospital Stay (HOSPITAL_COMMUNITY): Payer: Medicare Other

## 2016-03-23 DIAGNOSIS — I4891 Unspecified atrial fibrillation: Secondary | ICD-10-CM

## 2016-03-23 DIAGNOSIS — M4646 Discitis, unspecified, lumbar region: Secondary | ICD-10-CM

## 2016-03-23 LAB — CBC
HCT: 23.5 % — ABNORMAL LOW (ref 39.0–52.0)
Hemoglobin: 8.1 g/dL — ABNORMAL LOW (ref 13.0–17.0)
MCH: 31.2 pg (ref 26.0–34.0)
MCHC: 34.5 g/dL (ref 30.0–36.0)
MCV: 90.4 fL (ref 78.0–100.0)
PLATELETS: 191 10*3/uL (ref 150–400)
RBC: 2.6 MIL/uL — ABNORMAL LOW (ref 4.22–5.81)
RDW: 14.6 % (ref 11.5–15.5)
WBC: 8.8 10*3/uL (ref 4.0–10.5)

## 2016-03-23 LAB — BASIC METABOLIC PANEL
Anion gap: 5 (ref 5–15)
BUN: 12 mg/dL (ref 6–20)
CALCIUM: 8.5 mg/dL — AB (ref 8.9–10.3)
CHLORIDE: 105 mmol/L (ref 101–111)
CO2: 25 mmol/L (ref 22–32)
CREATININE: 0.68 mg/dL (ref 0.61–1.24)
GFR calc non Af Amer: >60 mL/min (ref >60)
Glucose, Bld: 104 mg/dL — ABNORMAL HIGH (ref 65–99)
Potassium: 3.8 mmol/L (ref 3.5–5.1)
SODIUM: 135 mmol/L (ref 135–145)

## 2016-03-23 LAB — PREPARE RBC (CROSSMATCH)

## 2016-03-23 MED ORDER — POLYETHYLENE GLYCOL 3350 17 G PO PACK
17.0000 g | PACK | Freq: Every day | ORAL | Status: DC
  Administered 2016-03-23 – 2016-03-24 (×2): 17 g via ORAL
  Filled 2016-03-23 (×2): qty 1

## 2016-03-23 MED ORDER — SODIUM CHLORIDE 0.9 % IV SOLN
Freq: Once | INTRAVENOUS | Status: AC
  Administered 2016-03-23: 21:00:00 via INTRAVENOUS

## 2016-03-23 MED ORDER — SENNOSIDES-DOCUSATE SODIUM 8.6-50 MG PO TABS
1.0000 | ORAL_TABLET | Freq: Two times a day (BID) | ORAL | Status: DC
  Administered 2016-03-23 – 2016-03-24 (×3): 1 via ORAL
  Filled 2016-03-23 (×3): qty 1

## 2016-03-23 NOTE — Progress Notes (Signed)
Physical Therapy Treatment Patient Details Name: Jonathan Holden MRN: [id number] DOB: [birth year] Today's Date: 03/23/2016    History of Present Illness Pt was admitted for bloody diarrhea. Hgb 7.1. He has had several recent hospitalizations for diverticular bleed, [name], and discitis.  PMH:  CAD, HTN    PT Comments    The  Patient is clearer today. The right knee has edema and effusion, the right foot also with edema. The patient's BP 90/62 after standing up HR 123. RN and MD aware.   Follow Up Recommendations  Home health PT;Supervision/Assistance - 24 hour (pt declines SNF)     Equipment Recommendations    none   Recommendations for Other Services       Precautions / Restrictions Precautions Precautions: Fall Precaution Comments: pt reports he has a back brace, which he hasn't been wearing Required Braces or Orthoses: Knee Immobilizer - Right    Mobility  Bed Mobility Overal bed mobility: Needs Assistance Bed Mobility: Supine to Sit;Sit to Supine   Sidelying to sit: Mod assist Supine to sit: Mod assist Sit to supine: Mod assist   General bed mobility comments: assist with the right leg off of the bed and onto the bed., extra time to accomplish activity.  Transfers Overall transfer level: Needs assistance Equipment used: Rolling walker (2 wheeled) Transfers: Sit to/from Stand Sit to Stand: Mod assist;+2 safety/equipment         General transfer comment: multimodal cues for safety, hand posiiton. Stood at bedside x 3 minutes, practiced weight through the right leg. dDid not take any steps, Sat back down due to feeling weak and light headed.  assisted backl into bed  Ambulation/Gait             General Gait Details: unable to ambulate today. BP low and increased pain   Stairs            Wheelchair Mobility    Modified Rankin (Stroke Patients Only)       Balance                                    Cognition Arousal/Alertness:  Awake/alert Behavior During Therapy: Anxious Overall Cognitive Status: Within Functional Limits for tasks assessed                      Exercises      General Comments        Pertinent Vitals/Pain Pain Score: 6  Faces Pain Scale: Hurts even more Pain Location: right knee Pain Descriptors / Indicators: Stabbing;Grimacing;Guarding Pain Intervention(s): Monitored during session;Repositioned    Home Living Family/patient expects to be discharged to:: Private residence Living Arrangements: Spouse/significant other Available Help at Discharge: Available 24 hours/day Type of Home: House Home Access: Stairs to enter Entrance Stairs-Rails: None Home Layout: Two level;Able to live on main level with bedroom/bathroom Home Equipment: Gilford Rile - 2 wheels;Shower seat;Bedside commode;Wheelchair - manual Additional Comments: DC from CIR 03/09/16    Prior Function Level of Independence: Needs assistance  Gait / Transfers Assistance Needed: uses RW   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, Linna Hoff   PT Goals (current goals can now be found in the care plan section) Acute Rehab PT Goals Patient Stated Goal: to return to PLOF Progress towards PT goals: Not progressing toward goals - comment (right knee swelling and pain)    Frequency    Min 3X/week  PT Plan Current plan remains appropriate    Co-evaluation PT/OT/SLP Co-Evaluation/Treatment: Yes Reason for Co-Treatment: Complexity of the patient's impairments (multi-system involvement);For patient/therapist safety PT goals addressed during session: Mobility/safety with mobility OT goals addressed during session: ADL's and self-care     End of Session   Activity Tolerance: Patient limited by pain;Treatment limited secondary to medical complications (Comment) Patient left: in bed;with call bell/phone within reach;with bed alarm set Nurse Communication: Mobility status PT Visit Diagnosis: Muscle weakness (generalized)  (M62.81);Pain;Dizziness and giddiness (R42) Pain - Right/Left: Right Pain - part of body: Knee     Time: [id number] PT Time Calculation (min) (ACUTE ONLY): 48 min  Charges:  $Therapeutic Activity: 23-37 mins                    G Codes:       Marcelino Freestone PT [id number]  03/23/2016, 5:19 PM

## 2016-03-23 NOTE — Progress Notes (Signed)
PROGRESS NOTE    Jonathan Holden  [id number] DOB: 05-15-1945 DOA: 03/17/2016 PCP: Sinclair Grooms, MD    Brief Narrative: Jonathan Holden is a 71 y.o. male with medical history significant  for a number of hospitalizations in the last few months including several for diverticular bleeding which have been self-contained as well as A. fib being started on eliquis and a discitis leading to daily IV Rocephin 2 g. The last couple of days, patient noted bloody diarrhea, but because he previously experienced self-contained diverticular bleeds and had come in and was told it was nothing to do, he held off. However symptoms persisted today and he felt a little more lightheaded so he came into the emergency room.   ED Course: In the emergency room, patient's hemoglobin was noted to be 7.1 when 3 days ago it was 12.6. Patient was ordered 2 units packed red blood cells. The rest of his labs were unremarkable. Hospitalists were called for further evaluation.     Assessment & Plan:   Active Problems:   Hyperlipidemia   CAD (coronary artery disease), native coronary artery   Essential hypertension   Atrial fibrillation with RVR (HCC)   Lumbar discitis   Endocarditis of native valve   Acute blood loss anemia   GI bleed   1-Acute blood loss anemia, in setting of GI bleed, diverticular bleed.  He has received 6 units of PRBC.  IV Protonix. Appreciate Dr Hubbard Hartshorn assistance.  Holding eliquis. Continue to hold at discharge  No further bleeding. Tolerating diet. No BM in 24 hours.  Continue with ferrous sulfate.  Hb stable. Patient orthostatic, I will transfuse one unit PRBC to see if this will help with orthostatic  2-history of paroxysmal [name];  Holding eliquis due to GI bleed. Will continue to hold at discharge.  Continue with  50 mg BID.  Patient develops episode of [name] RVR last night then converted to sinus. Had another episode this morning. Cardiology consulted.    3-Lumbar  Discitis;  Continue with IV ceftriaxone.  Needs IV antibiotics until 04/05/2016 Needs to follow up with Dr Tommy Medal and Dr Emiliano Dyer.   4-Right Knee Pain and edema.  Dr Emiliano Dyer consulted.  X-ray ordered. Dr Cay Schillings will see patient tomorrow.   DVT prophylaxis: SCD, no anticoagulation in setting of bleeding.  Code Status: Full code.  Family Communication: sister in law at bedside.  Disposition Plan: will order one unit PRBC>   Consultants:   Dr Michail Sermon.    Procedures: none   Antimicrobials:   Ceftriaxone.    Subjective: Got dizzy yesterday with PT  No BM yet   Objective: Vitals:   03/22/16 0416 03/22/16 1439 03/22/16 2155 03/23/16 0549  BP: 115/71 114/62 (!) 91/57 120/62  Pulse: 95 91 83 89  Resp: 18 18 19 19   Temp: 98.6 F (37 C) 99 F (37.2 C) 98.2 F (36.8 C) 97.7 F (36.5 C)  TempSrc: Oral Oral Oral Oral  SpO2: 97% 99% 99% 99%  Weight:      Height:        Intake/Output Summary (Last 24 hours) at 03/23/16 1443 Last data filed at 03/23/16 0600  Gross per 24 hour  Intake          1748.75 ml  Output              350 ml  Net          1398.75 ml   Filed Weights   03/17/16  1126 03/17/16 1600  Weight: 86.2 kg (190 lb) 86.2 kg (190 lb 0.6 oz)    Examination:  General exam: Appears calm and comfortable  Respiratory system: Clear to auscultation. Respiratory effort normal. Cardiovascular system: S1 & S2 heard, RRR. No JVD, murmurs, rubs, gallops or clicks. No pedal edema. Gastrointestinal system: Abdomen is nondistended, soft and nontender. No organomegaly or masses felt. Normal bowel sounds heard. Central nervous system: Alert and oriented. No focal neurological deficits. Extremities: Symmetric 5 x 5 power. Right knee with swelling.  Skin: No rashes, lesions or ulcers     Data Reviewed: I have personally reviewed following labs and imaging studies  CBC:  Recent Labs Lab 03/17/16 1149  03/18/16 0800  03/19/16 1551 03/20/16 0417 03/21/16 0526  03/22/16 0406 03/23/16 0412  WBC 11.1*  --  9.0  --   --   --  8.4  --  8.8  HGB 7.1*  < > 8.5*  < > 8.5* 8.9* 8.3* 8.7* 8.1*  HCT 21.2*  < > 24.5*  < > 24.9* 25.5* 24.0* 25.8* 23.5*  MCV 90.6  --  89.1  --   --   --  89.2  --  90.4  PLT 244  --  161  --   --   --  166  --  191  < > = values in this interval not displayed. Basic Metabolic Panel:  Recent Labs Lab 03/18/16 0800 03/20/16 0417 03/21/16 0526 03/22/16 0406 03/22/16 0411 03/23/16 0412  NA 137 137 136  --  132* 135  K 3.9 3.4* 3.5  --  3.4* 3.8  CL 110 108 106  --  100* 105  CO2 24 23 24   --  24 25  GLUCOSE 115* 92 103*  --  107* 104*  BUN 15 6 10   --  10 12  CREATININE 0.70 0.73 0.74  --  0.66 0.68  CALCIUM 7.9* 8.1* 8.4*  --  8.4* 8.5*  MG  --   --   --   --  1.7  --   PHOS  --   --   --  4.1  --   --    GFR: Estimated Creatinine Clearance: 98.5 mL/min (by C-G formula based on SCr of 0.68 mg/dL). Liver Function Tests:  Recent Labs Lab 03/17/16 1149  AST 16  ALT 26  ALKPHOS 71  BILITOT 0.4  PROT 5.6*  ALBUMIN 2.4*   No results for input(s): LIPASE, AMYLASE in the last 168 hours. No results for input(s): AMMONIA in the last 168 hours. Coagulation Profile:  Recent Labs Lab 03/17/16 1149  INR 1.46   Cardiac Enzymes: No results for input(s): CKTOTAL, CKMB, CKMBINDEX, TROPONINI in the last 168 hours. BNP (last 3 results) No results for input(s): PROBNP in the last 8760 hours. HbA1C: No results for input(s): HGBA1C in the last 72 hours. CBG: No results for input(s): GLUCAP in the last 168 hours. Lipid Profile: No results for input(s): CHOL, HDL, LDLCALC, TRIG, CHOLHDL, LDLDIRECT in the last 72 hours. Thyroid Function Tests: No results for input(s): TSH, T4TOTAL, FREET4, T3FREE, THYROIDAB in the last 72 hours. Anemia Panel: No results for input(s): VITAMINB12, FOLATE, FERRITIN, TIBC, IRON, RETICCTPCT in the last 72 hours. Sepsis Labs:  Recent Labs Lab 03/17/16 1203  LATICACIDVEN 1.02     Recent Results (from the past 240 hour(s))  MRSA PCR Screening     Status: None   Collection Time: 03/17/16  3:30 PM  Result Value Ref Range Status  MRSA by PCR NEGATIVE NEGATIVE Final    Comment:        The GeneXpert MRSA Assay (FDA approved for NASAL specimens only), is one component of a comprehensive MRSA colonization surveillance program. It is not intended to diagnose MRSA infection nor to guide or monitor treatment for MRSA infections.          Radiology Studies: Dg Knee Complete 4 Views Right  Result Date: 03/23/2016 CLINICAL DATA:  Anterior right knee pain, swelling EXAM: RIGHT KNEE - COMPLETE 4+ VIEW COMPARISON:  None. FINDINGS: Degenerative changes with joint space narrowing and spurring in all 3 compartments. Large joint effusion. No acute bony abnormality. No fracture, subluxation or dislocation. IMPRESSION: Degenerative changes. Large joint effusion. No acute bony abnormality. Electronically Signed   By: Rolm Baptise M.D.   On: 03/23/2016 10:52        Scheduled Meds: . cefTRIAXone (ROCEPHIN)  IV  2 g Intravenous Q24H  . Chlorhexidine Gluconate Cloth  6 each Topical Daily  . feeding supplement (ENSURE ENLIVE)  237 mL Oral BID BM  . ferrous sulfate  325 mg Oral BID WC  . lidocaine  1 patch Transdermal Q24H  . metoprolol tartrate  50 mg Oral BID  . pantoprazole  40 mg Intravenous Q12H  . polyethylene glycol  17 g Oral Daily  . saccharomyces boulardii  250 mg Oral BID  . senna-docusate  1 tablet Oral BID  . sodium chloride flush  3 mL Intravenous Q12H   Continuous Infusions: . sodium chloride 75 mL/hr at 03/22/16 1657     LOS: 6 days    Time spent: 35 minutes.     Elmarie Shiley, MD [facility] Pager [phone]  If 7PM-7AM, please contact night-coverage [email or web address] Password [facility] 03/23/2016, 2:43 PM

## 2016-03-23 NOTE — Progress Notes (Signed)
Spoke with pt in length concerning a PCP, telephone number left on yellow pad for pt's wife to call for a list of PCP taking new pt's. Also pt can call PCP office of his choosing for an appointment.

## 2016-03-23 NOTE — Clinical Social Work Note (Signed)
CSW was contacted by nursing to talk to patient about transportation options.  Met with patient and his sister-in-law this afternoon. They were very concerned about how patient would get home as he is having great difficulties with his R knee which is suddenly swollen and very painful.  Patient denies ability to move his leg or bear weight on it.  Rachel Bo was notified and assessed patient with appt set up for Tuesday. Patient was concerned about cost of ambulance transport home from the hospital and possibly to MD appointment.  Also discussed option of [facility]ian[facility] Lei transport as less expensive option.  Patient is very tall (6'2") and does not have leg extended on his wheelchair at home.  Nursing was unsure if patient would go home this evening; waas awaiting clearance from cardiology due to recent bout of A-fib.  CSW completed [facility] ambulance from "just in case" he was released this evening and nursing notified how to call [facility].  Notified Alesia RNCM of above.  [facility] weekday number provided to patient/family as well to use to call from transport next week if needed to MD office.  Patient states he has 2 steps to get into his house but has some wooden slats that could be utilized for makeshift ramp.  PT recommends HH PT; notified RNCM of this. CSW will sign off but available if needed.  [name]. Pauline Good, Ginger Blue

## 2016-03-23 NOTE — Progress Notes (Signed)
Progress Note  Patient Name: Jonathan Holden Date of Encounter: 03/23/2016  Primary Cardiologist: Tamala Julian  Subjective   Feeling well today. No Afib on telemetry. Hoping to go home soon.   Inpatient Medications    Scheduled Meds: . cefTRIAXone (ROCEPHIN)  IV  2 g Intravenous Q24H  . Chlorhexidine Gluconate Cloth  6 each Topical Daily  . feeding supplement (ENSURE ENLIVE)  237 mL Oral BID BM  . ferrous sulfate  325 mg Oral BID WC  . lidocaine  1 patch Transdermal Q24H  . metoprolol tartrate  50 mg Oral BID  . pantoprazole  40 mg Intravenous Q12H  . polyethylene glycol  17 g Oral Daily  . saccharomyces boulardii  250 mg Oral BID  . senna-docusate  1 tablet Oral BID  . sodium chloride flush  3 mL Intravenous Q12H   Continuous Infusions: . sodium chloride 75 mL/hr at 03/22/16 1657   PRN Meds: metoprolol, ondansetron **OR** ondansetron (ZOFRAN) IV, oxyCODONE, sodium chloride flush   Vital Signs    Vitals:   03/22/16 0416 03/22/16 1439 03/22/16 2155 03/23/16 0549  BP: 115/71 114/62 (!) 91/57 120/62  Pulse: 95 91 83 89  Resp: 18 18 19 19   Temp: 98.6 F (37 C) 99 F (37.2 C) 98.2 F (36.8 C) 97.7 F (36.5 C)  TempSrc: Oral Oral Oral Oral  SpO2: 97% 99% 99% 99%  Weight:      Height:        Intake/Output Summary (Last 24 hours) at 03/23/16 1300 Last data filed at 03/23/16 0600  Gross per 24 hour  Intake          2118.75 ml  Output             1150 ml  Net           968.75 ml   Filed Weights   03/17/16 1126 03/17/16 1600  Weight: 190 lb (86.2 kg) 190 lb 0.6 oz (86.2 kg)    Telemetry    SR with PVCs - Personally Reviewed  ECG    N/A- Personally Reviewed  Physical Exam   General: Well developed, well nourished, male appearing in no acute distress. Head: Normocephalic, atraumatic.  Neck: Supple without bruits, JVD. Lungs:  Resp regular and unlabored, CTA. Heart: RRR, S1, S2, no S3, S4, or murmur; no rub. Abdomen: Soft, non-tender, non-distended with  normoactive bowel sounds. No hepatomegaly. No rebound/guarding. No obvious abdominal masses. Extremities: No clubbing, cyanosis, edema. Distal pedal pulses are 2+ bilaterally. Wearing brace to right leg. Neuro: Alert and oriented X 3. Moves all extremities spontaneously. Psych: Normal affect.  Labs    Chemistry Recent Labs Lab 03/17/16 1149  03/21/16 0526 03/22/16 0411 03/23/16 0412  NA 135  < > 136 132* 135  K 4.2  < > 3.5 3.4* 3.8  CL 108  < > 106 100* 105  CO2 22  < > 24 24 25   GLUCOSE 170*  < > 103* 107* 104*  BUN 21*  < > 10 10 12   CREATININE 0.79  < > 0.74 0.66 0.68  CALCIUM 7.9*  < > 8.4* 8.4* 8.5*  PROT 5.6*  --   --   --   --   ALBUMIN 2.4*  --   --   --   --   AST 16  --   --   --   --   ALT 26  --   --   --   --   ALKPHOS 71  --   --   --   --  BILITOT 0.4  --   --   --   --   GFRNONAA >60  < > >60 >60 >60  GFRAA >60  < > >60 >60 >60  ANIONGAP 5  < > 6 8 5   < > = values in this interval not displayed.   Hematology Recent Labs Lab 03/18/16 0800  03/21/16 0526 03/22/16 0406 03/23/16 0412  WBC 9.0  --  8.4  --  8.8  RBC 2.75*  --  2.69*  --  2.60*  HGB 8.5*  < > 8.3* 8.7* 8.1*  HCT 24.5*  < > 24.0* 25.8* 23.5*  MCV 89.1  --  89.2  --  90.4  MCH 30.9  --  30.9  --  31.2  MCHC 34.7  --  34.6  --  34.5  RDW 14.7  --  14.7  --  14.6  PLT 161  --  166  --  191  < > = values in this interval not displayed.  Cardiac EnzymesNo results for input(s): TROPONINI in the last 168 hours. No results for input(s): TROPIPOC in the last 168 hours.   BNPNo results for input(s): BNP, PROBNP in the last 168 hours.   DDimer No results for input(s): DDIMER in the last 168 hours.    Radiology    Dg Knee Complete 4 Views Right  Result Date: 03/23/2016 CLINICAL DATA:  Anterior right knee pain, swelling EXAM: RIGHT KNEE - COMPLETE 4+ VIEW COMPARISON:  None. FINDINGS: Degenerative changes with joint space narrowing and spurring in all 3 compartments. Large joint effusion. No  acute bony abnormality. No fracture, subluxation or dislocation. IMPRESSION: Degenerative changes. Large joint effusion. No acute bony abnormality. Electronically Signed   By: Rolm Baptise M.D.   On: 03/23/2016 10:52    Cardiac Studies   N/A  Patient Profile     71 y.o. male with PMH of PAF, HTN, HL and CAD who presented with GI bleed requiring PRBCs.   Assessment & Plan    1. Paroxysmal atrial fibrillation: It appears that he has been in atrial fibrillation for the last month or so. He has had a quite complicated month with multiple infections, back issues, GI bleeding from diverticuli. Likely his Afib is relates to recurrent illnesses and hospitalizations.  -- Unable to tolerate [place] with recurrent GI bleeding. Metoprolol was increased to 50mg  BID yesterday. No further episodes of AF and blood pressure has tolerated well. He has follow up arranged in the office with Dr. Tamala Julian this week.   2. Hypertension: BP has been well controlled  3. Hyperlipidemia: continue current managment  4. Coronary artery disease: no anginal symptoms. -- Continue atorvastatin, metoprolol.   Signed, Reino Bellis, NP  03/23/2016, 1:00 PM     Attending Note:   The patient was seen and examined.  Agree with assessment and plan as noted above.  Changes made to the above note as needed.  Patient seen and independently examined with Reino Bellis, NP.   We discussed all aspects of the encounter. I agree with the assessment and plan as stated above.  1. PAF:   Agree with holding the Eliquis.   And concentrating on rate control. His metoprolol has been increased and he seems to be tolerating it well.  He can go home from cardiology standpoint.     I have spent a total of 30 minutes with patient reviewing hospital  notes , telemetry, EKGs, labs and examining patient as well as establishing an assessment and plan  that was discussed with the patient. > 50% of time was spent in direct patient  care.  Will sign off.    Call for questions    Ramond Dial., MD, [facility] 03/23/2016, 1:48 PM [id number] [address]  [place] Pager [phone]

## 2016-03-23 NOTE — Progress Notes (Signed)
Occupational Therapy Treatment Patient Details Name: Jonathan Holden MRN: [id number] DOB: 1945/12/29 Today's Date: 03/23/2016    History of present illness Pt was admitted for bloody diarrhea. Hgb 7.1. He has had several recent hospitalizations for diverticular bleed, [name], and discitis.  PMH:  CAD, HTN   OT comments  Pt not able to get to [facility] this day- MD aware  Follow Up Recommendations  Home health OT;Supervision/Assistance - 24 hour;SNF    Equipment Recommendations  None recommended by OT    Recommendations for Other Services      Precautions / Restrictions Precautions Precautions: Fall Precaution Comments: pt reports he has a back brace, which he hasn't been wearing Required Braces or Orthoses: Knee Immobilizer - Right       Mobility Bed Mobility   Bed Mobility: Supine to Sit;Sit to Supine   Sidelying to sit: Mod assist Supine to sit: Mod assist     General bed mobility comments: assist with the right leg off of the bed and onto the bed., extra time to accomplish activity.  Transfers Overall transfer level: Needs assistance Equipment used: Rolling walker (2 wheeled) Transfers: Sit to/from Stand Sit to Stand: Mod assist;+2 safety/equipment         General transfer comment: multimodal cues for safety, hand posiiton. Stood at bedside x 3 minutes, practiced weight through the right leg. dDid not take any steps, Sat back down due to feeling weak and light headed.  assisted backl into bed    Balance                                   ADL Overall ADL's : Needs assistance/impaired     Grooming: Set up;Sitting           Upper Body Dressing : Set up;Sitting   Lower Body Dressing: Maximal assistance;Sit to/from stand;Cueing for safety;Cueing for sequencing;Cueing for compensatory techniques Lower Body Dressing Details (indicate cue type and reason): will benefit from AE   Toilet Transfer Details (indicate cue type and reason): pt performed sit  to stand but not able to get  to [facility]. Pt fatigued and wanted to return to supine           General ADL Comments: tx limited as pt fatigued, as well as BP decreased upon sitting. MD aware                Cognition   Behavior During Therapy: Anxious Overall Cognitive Status: Within Functional Limits for tasks assessed                                    Pertinent Vitals/ Pain       Pain Score: 6  Faces Pain Scale: Hurts even more Pain Location: right knee Pain Descriptors / Indicators: Stabbing;Grimacing;Guarding Pain Intervention(s): Monitored during session;Repositioned  Home Living Family/patient expects to be discharged to:: Private residence Living Arrangements: Spouse/significant other Available Help at Discharge: Available 24 hours/day Type of Home: House Home Access: Stairs to enter [employer] of Steps: 1+1 Entrance Stairs-Rails: None Home Layout: Two level;Able to live on main level with bedroom/bathroom     Bathroom Shower/Tub: Occupational psychologist: Standard Bathroom Accessibility: Yes   Home Equipment: Environmental consultant - 2 wheels;Shower seat;Bedside commode;Wheelchair - manual   Additional Comments: DC from CIR 03/09/16  Lives With: Significant other  Prior Functioning/Environment Level of Independence: Needs assistance  Gait / Transfers Assistance Needed: uses RW     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, Linna Hoff   Frequency  Min 2X/week        Progress Toward Goals  OT Goals(current goals can now be found in the care plan section)  Progress towards OT goals: Progressing toward goals  Acute Rehab OT Goals Patient Stated Goal: to return to [facility]  Plan Discharge plan needs to be updated    Co-evaluation    PT/OT/SLP Co-Evaluation/Treatment: Yes Reason for Co-Treatment: Complexity of the patient's impairments (multi-system involvement);For patient/therapist safety PT goals addressed during session:  Mobility/safety with mobility OT goals addressed during session: ADL's and self-care      End of Session    OT Visit Diagnosis: Pain;Unsteadiness on feet (R26.81)   Activity Tolerance Patient tolerated treatment well   Patient Left in chair;with call bell/phone within reach;with chair alarm set   Nurse Communication          Time: [id number] OT Time Calculation (min): 34 min  Charges: OT General Charges $OT Visit: 1 Procedure OT Treatments $Self Care/Home Management : 8-22 mins  [place], [place]   Betsy Pries 03/23/2016, 3:48 PM

## 2016-03-24 LAB — BASIC METABOLIC PANEL
Anion gap: 8 (ref 5–15)
BUN: 8 mg/dL (ref 6–20)
CALCIUM: 8.7 mg/dL — AB (ref 8.9–10.3)
CO2: 26 mmol/L (ref 22–32)
CREATININE: 0.6 mg/dL — AB (ref 0.61–1.24)
Chloride: 101 mmol/L (ref 101–111)
GFR calc non Af Amer: >60 mL/min (ref >60)
GLUCOSE: 96 mg/dL (ref 65–99)
Potassium: 4 mmol/L (ref 3.5–5.1)
Sodium: 135 mmol/L (ref 135–145)

## 2016-03-24 LAB — CBC
HCT: 27.6 % — ABNORMAL LOW (ref 39.0–52.0)
Hemoglobin: 9.4 g/dL — ABNORMAL LOW (ref 13.0–17.0)
MCH: 30.5 pg (ref 26.0–34.0)
MCHC: 34.1 g/dL (ref 30.0–36.0)
MCV: 89.6 fL (ref 78.0–100.0)
Platelets: 193 10*3/uL (ref 150–400)
RBC: 3.08 MIL/uL — ABNORMAL LOW (ref 4.22–5.81)
RDW: 14.5 % (ref 11.5–15.5)
WBC: 9 10*3/uL (ref 4.0–10.5)

## 2016-03-24 LAB — SYNOVIAL CELL COUNT + DIFF, W/ CRYSTALS
Crystals, Fluid: NONE SEEN
MONOCYTE-MACROPHAGE-SYNOVIAL FLUID: 7 % — AB (ref 50–90)
Neutrophil, Synovial: 93 % — ABNORMAL HIGH (ref 0–25)
WBC, Synovial: 11340 /mm3 — ABNORMAL HIGH (ref 0–200)

## 2016-03-24 MED ORDER — SENNOSIDES-DOCUSATE SODIUM 8.6-50 MG PO TABS: 1.0000 | ORAL_TABLET | Freq: Two times a day (BID) | ORAL | 0 refills | Status: DC

## 2016-03-24 MED ORDER — BISACODYL 10 MG RE SUPP
10.0000 mg | Freq: Once | RECTAL | Status: AC
  Administered 2016-03-24: 10 mg via RECTAL
  Filled 2016-03-24: qty 1

## 2016-03-24 MED ORDER — OXYCODONE HCL 5 MG PO TABS: 5.0000 mg | ORAL_TABLET | Freq: Four times a day (QID) | ORAL | 0 refills | Status: DC | PRN

## 2016-03-24 MED ORDER — METOPROLOL TARTRATE 25 MG PO TABS
25.0000 mg | ORAL_TABLET | Freq: Once | ORAL | Status: AC
  Administered 2016-03-24: 25 mg via ORAL
  Filled 2016-03-24: qty 1

## 2016-03-24 MED ORDER — HEPARIN SOD (PORK) LOCK FLUSH 100 UNIT/ML IV SOLN: 250.0000 [IU] | INTRAVENOUS | Status: DC | PRN

## 2016-03-24 MED ORDER — ENSURE ENLIVE PO LIQD: 237.0000 mL | Freq: Two times a day (BID) | ORAL | 12 refills | Status: DC

## 2016-03-24 MED ORDER — METOPROLOL TARTRATE 50 MG PO TABS: 75.0000 mg | ORAL_TABLET | Freq: Two times a day (BID) | ORAL | Status: DC

## 2016-03-24 MED ORDER — FERROUS SULFATE 325 (65 FE) MG PO TABS: 325.0000 mg | ORAL_TABLET | Freq: Two times a day (BID) | ORAL | 3 refills | Status: DC

## 2016-03-24 MED ORDER — METOPROLOL TARTRATE 75 MG PO TABS: 75.0000 mg | ORAL_TABLET | Freq: Two times a day (BID) | ORAL | 0 refills | Status: DC

## 2016-03-24 MED ORDER — POLYETHYLENE GLYCOL 3350 17 G PO PACK: 17.0000 g | PACK | Freq: Every day | ORAL | 0 refills | Status: DC

## 2016-03-24 MED ORDER — SACCHAROMYCES BOULARDII 250 MG PO CAPS: 250.0000 mg | ORAL_CAPSULE | Freq: Two times a day (BID) | ORAL | 0 refills | Status: DC

## 2016-03-24 NOTE — Progress Notes (Signed)
Occupational Therapy Treatment Patient Details Name: ZABIEN CUTHBERTSON MRN: [id number] DOB: [birth year] Today's Date: 03/24/2016    History of present illness Pt was admitted for bloody diarrhea. Hgb 7.1. He has had several recent hospitalizations for diverticular bleed, [name], and discitis.  PMH:  CAD, HTN. Dr Rushie Nyhan aspirated R knee 3/26   OT comments  Pt much improved!  Follow Up Recommendations  No OT follow up          Precautions / Restrictions Precautions Precaution Comments: R KI on when ambulating       Mobility Bed Mobility Overal bed mobility: Needs Assistance Bed Mobility: Supine to Sit     Supine to sit: Mod assist;Supervision        Transfers Overall transfer level: Needs assistance Equipment used: Rolling walker (2 wheeled) Transfers: Sit to/from [facility] Sit to Stand: Supervision Stand pivot transfers: Supervision                ADL               Lower Body Bathing: Minimal assistance;Sit to/from stand   Upper Body Dressing : Set up;Sitting   Lower Body Dressing: Minimal assistance;Sit to/from stand Lower Body Dressing Details (indicate cue type and reason): pt states he will obtain a Investment banker, operational Transfer: Supervision/safety;RW;Ambulation;BSC   Toileting- Water quality scientist and Hygiene: Min guard;Sit to/from stand;Cueing for safety;Cueing for sequencing     Tub/Shower Transfer Details (indicate cue type and reason): verbalized safety with shower t4ransfer using walker   General ADL Comments: pt will have A at home as needed      Vision                            Cognition   Behavior During Therapy: [facility] for tasks assessed/performed Overall Cognitive Status: Within Functional Limits for tasks assessed                                    Pertinent Vitals/ Pain       Pain Score: 3  Pain Location: right knee Pain Descriptors / Indicators: Tender;Pressure Pain Intervention(s):  Monitored during session;Repositioned         Frequency  Min 2X/week        Progress Toward Goals  OT Goals(current goals can now be found in the care plan section)  Progress towards OT goals: Progressing toward goals     Plan Discharge plan needs to be updated    Co-evaluation                 End of Session Equipment Utilized During Treatment: Rolling walker      Activity Tolerance Patient tolerated treatment well   Patient Left in chair   Nurse Communication Mobility status        Time: [id number] OT Time Calculation (min): 13 min  Charges: OT General Charges $OT Visit: 1 Procedure OT Treatments $Self Care/Home Management : 8-22 mins  [place], [place]   Payton Mccallum D 03/24/2016, 1:22 PM

## 2016-03-24 NOTE — Progress Notes (Addendum)
Patient's R SL PICC is flushed with 10 cc 0.9% saline flush. Spoke with Floor RN not to flush PICC with heparin per MD due to patient's GI bleed.    Enos Fling RN/IV VAST Team

## 2016-03-24 NOTE — Discharge Summary (Signed)
Physician Discharge Summary  Jonathan Holden DOB: 1945-07-13 DOA: 03/17/2016  PCP: Jonathan Grooms, MD  Admit date: 03/17/2016 Discharge date: 03/24/2016  Admitted From: Home  Disposition:  Home   Recommendations for Outpatient Follow-up:  1. Follow up with PCP in 1-2 weeks 2. Please obtain BMP/CBC in one week 3. Needs to follow up with Jonathan Holden for further care of knee effusion.  4. Follow up with ID for care of diskitis.  5. Follow with cardiology for further titration of medications for [name].   Home Health:Yes.   Discharge Condition: Stable.  CODE STATUS: Full code.  Diet recommendation: Heart Healthy  Brief/Interim Summary: Jonathan Kaupp Linkeris a 71 y.o.malewith medical history significant for a number of hospitalizations in the last few months including several for diverticular bleeding which have been self-contained as well as A. fib being started on eliquis and a discitis leading to daily IV Rocephin 2 g. The last couple of days, patient noted bloody diarrhea, but because he previously experienced self-contained diverticular bleeds and had come in and was told it was nothing to do, he held off. However symptoms persisted today and he felt a little more lightheaded so he came into the emergency room.   ED Course:In the emergency room, patient's hemoglobin was noted to be 7.1 when 3 days ago it was 12.6. Patient was ordered 2 units packed red blood cells. The rest of his labs were unremarkable. Hospitalists were called for further evaluation.     Assessment & Plan:   Active Problems:   Hyperlipidemia   CAD (coronary artery disease), native coronary artery   Essential hypertension   Atrial fibrillation with RVR (HCC)   Lumbar discitis   Endocarditis of native valve   Acute blood loss anemia   GI bleed   1-Acute blood loss anemia, in setting of GI bleed, diverticular bleed.  He  received 7 units of PRBC during this admission.   Appreciate Jonathan  Hubbard Holden assistance.  Holding eliquis. Continue to hold at discharge  No further bleeding. Tolerating diet.  Continue with ferrous sulfate.  Hb stable. Patient orthostatic, transfused one unit PRBC Hb at 9. Symptoms improved.   2- paroxysmal [name];  Holding eliquis due to GI bleed. Will continue to hold at discharge.  Patient had episodes of [name] RVR. Cardiology consulted.  Metoprolol increased to 75 mg BID.  Ok to discharge home today, per cardiology.    3-Lumbar Discitis;  Continue with IV ceftriaxone.  Needs IV antibiotics until 04/05/2016 Needs to follow up with Jonathan Holden and Jonathan Holden.   4-Right Knee Pain effusion; Jonathan Holden consulted.  Underwent aspiration 3-6, Synovial fluids negative for crystal. WBC 11,000. Discussed with Driofree results. Patient to follow up in the office. He will repeat aspirate as needed.  When he aspirated fluid, didn't look infected.     Discharge Diagnoses:  Active Problems:   Hyperlipidemia   CAD (coronary artery disease), native coronary artery   Essential hypertension   Atrial fibrillation with RVR (HCC)   Lumbar discitis   Endocarditis of native valve   Acute blood loss anemia   GI bleed    Discharge Instructions  Discharge Instructions    Diet - low sodium heart healthy    Complete by:  As directed    Increase activity slowly    Complete by:  As directed      Allergies as of 03/24/2016      Reactions   Food Other (See  Comments)   Pt is allergic to melons.   Reaction:  Unknown    Ciprofloxacin Hives      Medication List    STOP taking these medications   acidophilus Caps capsule   PRALUENT 150 MG/ML Sopn Generic drug:  Alirocumab     TAKE these medications   atorvastatin 40 MG tablet Commonly known as:  LIPITOR Take 1 tablet (40 mg total) by mouth daily. What changed:  when to take this   cefTRIAXone IVPB Commonly known as:  ROCEPHIN Inject 2 g into the vein daily. Indication:  Diskitis, bacteremia  Last Day of Therapy:  04/05/16 Labs - Once weekly:  CBC/D and BMP, Labs - Every other week:  ESR and CRP   feeding supplement (ENSURE ENLIVE) Liqd Take 237 mLs by mouth 2 (two) times daily between meals.   ferrous sulfate 325 (65 FE) MG tablet Take 1 tablet (325 mg total) by mouth 2 (two) times daily with a meal.   folic acid 1 MG tablet Commonly known as:  FOLVITE Take 1 tablet (1 mg total) by mouth daily.   guaiFENesin 600 MG 12 hr tablet Commonly known as:  MUCINEX Take 1 tablet (600 mg total) by mouth 2 (two) times daily. What changed:  when to take this  reasons to take this   Melatonin 3 MG Tabs Take 3 tablets (9 mg total) by mouth at bedtime as needed (sleep).   Metoprolol Tartrate 75 MG Tabs Take 75 mg by mouth 2 (two) times daily. What changed:  medication strength  how much to take   multivitamin with minerals Tabs tablet Take 1 tablet by mouth daily.   oxyCODONE 5 MG immediate release tablet Commonly known as:  Oxy IR/ROXICODONE Take 1 tablet (5 mg total) by mouth every 6 (six) hours as needed for severe pain.   polyethylene glycol packet Commonly known as:  MIRALAX / GLYCOLAX Take 17 g by mouth daily. Start taking on:  03/25/2016   prednisoLONE acetate 1 % ophthalmic suspension Commonly known as:  PRED FORTE Place 1 drop into the left eye daily.   saccharomyces boulardii 250 MG capsule Commonly known as:  FLORASTOR Take 1 capsule (250 mg total) by mouth 2 (two) times daily.   senna-docusate 8.6-50 MG tablet Commonly known as:  Senokot-S Take 1 tablet by mouth 2 (two) times daily.      Follow-up Information    [facility] Follow up.   Why:  Home Health RN, Physical Therapy, Occupational Therapy, Speech Therapy and aide Contact information: [place] [address] [phone]        Jonathan Bastos, MD Follow up.   Specialty:  Orthopedic Surgery Why:  needs to follow up with Jonathan Holden on friday or  saturday.  Contact information: [address] [place] [address] [phone]          Allergies  Allergen Reactions  . 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   Reaction:  Unknown   . Ciprofloxacin Hives    Consultations: Cardiology Jonathan Holden.    Procedures/Studies: Ct Maxillofacial W Contrast  Addendum Date: 02/26/2016   ADDENDUM REPORT: 02/26/2016 17:06 ADDENDUM: There is mild lucency surrounding the roots of the most posterior remaining right mandibular molar. Otherwise, there is no abnormality of the remaining teeth. I discussed this on 02/26/2016 at 5:03 pm with Jonathan. Rhina Brackett DAM . Electronically Signed   By: Cletus Gash.D.  On: 02/26/2016 17:06   Result Date: 02/26/2016 CLINICAL DATA:  Bacteremia.  Investigation for source. EXAM: CT MAXILLOFACIAL WITH CONTRAST TECHNIQUE: Multidetector CT imaging of the maxillofacial structures was performed. Multiplanar CT image reconstructions were also generated. A small metallic BB was placed on the right temple in order to reliably differentiate right from left. COMPARISON:  None. FINDINGS: Osseous: No facial fracture. Orbits: There is a left scleral band. Bilateral lens replacements. Otherwise normal. Sinuses --Frontal: There is mild left frontal sinus mucosal thickening inferiorly with suspected occlusion of the left frontal recess. No right frontal sinus mucosal thickening. The right frontal recess is patent. --Ethmoid: Minimal ethmoid sinus mucosal thickening without fluid levels or osseous changes. --Sphenoid: Minimal sphenoid mucosal thickening without fluid levels or osseous abnormalities. Sphenoethmoidal recesses and sphenoid ostia are patent. --Maxillary: Bilateral minimal maxillary mucosal thickening without fluid levels or osseous abnormalities. There is bilateral narrowing of the infundibular recesses. --Nasal cavity: 4 mm of rightward septal deviation. --Nasopharynx: Clear. --Mastoid air  cells: No effusion. Soft tissues: Normal. Limited intracranial: Normal. IMPRESSION: Very mild mucosal thickening of all paranasal sinuses without evidence of acute sinusitis. 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 Electronically Signed: By: Ulyses Jarred M.D. On: 02/25/2016 19:49   Dg Chest Portable 1 View  Result Date: 03/10/2016 CLINICAL DATA:  Sepsis. EXAM: PORTABLE CHEST 1 VIEW COMPARISON:  Chest radiograph February 27, 2016 FINDINGS: RIGHT peripherally inserted central venous catheter looped at the brachiocephalic confluence, distal tip projecting in proximal superior vena cava. Cardiac silhouette is mildly enlarged and unchanged. Biapical pleural thickening. Mediastinal silhouette is nonsuspicious. No pleural effusion or focal consolidation. No pneumothorax. Moderate degenerative change of the RIGHT shoulder. IMPRESSION: Stable cardiomegaly, no acute pulmonary process. Retracted PICC distal tip now projects in proximal superior vena cava. Electronically Signed   By: Elon Alas M.D.   On: 03/10/2016 18:30   Dg Chest Port 1 View  Result Date: 02/27/2016 CLINICAL DATA:  Status post right-sided PICC line insertion. EXAM: PORTABLE CHEST 1 VIEW COMPARISON:  Portable chest x-ray of February 21, 2016 FINDINGS: The right-sided PICC line tip projects at the junction of middle and distal portions of the SVC. There is no postprocedure pneumothorax. The lungs are mildly hypoinflated. The interstitial markings are increased but have improved since the study of 6 days ago. The cardiac silhouette remains enlarged. The pulmonary vascularity is less engorged. The bony thorax exhibits no acute abnormality. IMPRESSION: Reasonable positioning of the PICC line with the tip at the junction of the middle and distal portions of the SVC. No postplacement complication. Electronically Signed   By: David  Martinique M.D.   On: 02/27/2016 12:40   Dg Knee Complete 4 Views Right  Result Date:  03/23/2016 CLINICAL DATA:  Anterior right knee pain, swelling EXAM: RIGHT KNEE - COMPLETE 4+ VIEW COMPARISON:  None. FINDINGS: Degenerative changes with joint space narrowing and spurring in all 3 compartments. Large joint effusion. No acute bony abnormality. No fracture, subluxation or dislocation. IMPRESSION: Degenerative changes. Large joint effusion. No acute bony abnormality. Electronically Signed   By: Rolm Baptise M.D.   On: 03/23/2016 10:52   Mr Outside Films Spine  Result Date: 03/06/2016 This examination belongs to an outside facility and is stored here for comparison purposes only.  Contact the originating outside institution for any associated report or interpretation.   (Echo, Carotid, EGD, Colonoscopy, ERCP)    Subjective:   Discharge Exam: Vitals:   03/24/16 0451 03/24/16 0946  BP: 106/62 (!) 131/110  Pulse: (!) 116   Resp: 18   Temp: 98.6 F (37 C)    Vitals:   03/23/16 2109 03/23/16 2348 03/24/16 0451 03/24/16 0946  BP: 116/70 113/66 106/62 (!) 131/110  Pulse: 98 92 (!) 116   Resp: 18 18 18    Temp: 99.5 F (37.5 C) 98.2 F (36.8 C) 98.6 F (37 C)   TempSrc: Oral Oral Oral   SpO2: 100% 97% 100%   Weight:      Height:        General: Pt is alert, awake, not in acute distress Cardiovascular: RRR, S1/S2 +, no rubs, no gallops Respiratory: CTA bilaterally, no wheezing, no rhonchi Abdominal: Soft, NT, ND, bowel sounds + Extremities: no edema, no cyanosis    The results of significant diagnostics from this hospitalization (including imaging, microbiology, ancillary and laboratory) are listed below for reference.     Microbiology: Recent Results (from the past 240 hour(s))  MRSA PCR Screening     Status: None   Collection Time: 03/17/16  3:30 PM  Result Value Ref Range Status   MRSA by PCR NEGATIVE NEGATIVE Final    Comment:        The GeneXpert MRSA Assay (FDA approved for NASAL specimens only), is one component of a comprehensive MRSA  colonization surveillance program. It is not intended to diagnose MRSA infection nor to guide or monitor treatment for MRSA infections.      Labs: BNP (last 3 results) No results for input(s): BNP in the last 8760 hours. Basic Metabolic Panel:  Recent Labs Lab 03/20/16 0417 03/21/16 0526 03/22/16 0406 03/22/16 0411 03/23/16 0412 03/24/16 0407  NA 137 136  --  132* 135 135  K 3.4* 3.5  --  3.4* 3.8 4.0  CL 108 106  --  100* 105 101  CO2 23 24  --  24 25 26   GLUCOSE 92 103*  --  107* 104* 96  BUN 6 10  --  10 12 8   CREATININE 0.73 0.74  --  0.66 0.68 0.60*  CALCIUM 8.1* 8.4*  --  8.4* 8.5* 8.7*  MG  --   --   --  1.7  --   --   PHOS  --   --  4.1  --   --   --    Liver Function Tests: No results for input(s): AST, ALT, ALKPHOS, BILITOT, PROT, ALBUMIN in the last 168 hours. No results for input(s): LIPASE, AMYLASE in the last 168 hours. No results for input(s): AMMONIA in the last 168 hours. CBC:  Recent Labs Lab 03/18/16 0800  03/20/16 0417 03/21/16 0526 03/22/16 0406 03/23/16 0412 03/24/16 0407  WBC 9.0  --   --  8.4  --  8.8 9.0  HGB 8.5*  < > 8.9* 8.3* 8.7* 8.1* 9.4*  HCT 24.5*  < > 25.5* 24.0* 25.8* 23.5* 27.6*  MCV 89.1  --   --  89.2  --  90.4 89.6  PLT 161  --   --  166  --  191 193  < > = values in this interval not displayed. Cardiac Enzymes: No results for input(s): CKTOTAL, CKMB, CKMBINDEX, TROPONINI in the last 168 hours. BNP: Invalid input(s): POCBNP CBG: No results for input(s): GLUCAP in the last 168 hours. D-Dimer No results for input(s): DDIMER in the last 72 hours. Hgb A1c No results for input(s): HGBA1C in the last 72 hours. Lipid Profile No results for input(s): CHOL, HDL, LDLCALC, TRIG, CHOLHDL, LDLDIRECT in the last  72 hours. Thyroid function studies No results for input(s): TSH, T4TOTAL, T3FREE, THYROIDAB in the last 72 hours.  Invalid input(s): FREET3 Anemia work up No results for input(s): VITAMINB12, FOLATE, FERRITIN, TIBC,  IRON, RETICCTPCT in the last 72 hours. Urinalysis    Component Value Date/Time   COLORURINE AMBER (A) 03/10/2016 1856   APPEARANCEUR HAZY (A) 03/10/2016 1856   LABSPEC 1.023 03/10/2016 1856   PHURINE 5.0 03/10/2016 1856   GLUCOSEU NEGATIVE 03/10/2016 1856   HGBUR NEGATIVE 03/10/2016 1856   BILIRUBINUR NEGATIVE 03/10/2016 1856   KETONESUR NEGATIVE 03/10/2016 1856   PROTEINUR 30 (A) 03/10/2016 1856   NITRITE NEGATIVE 03/10/2016 1856   LEUKOCYTESUR NEGATIVE 03/10/2016 1856   Sepsis Labs Invalid input(s): PROCALCITONIN,  WBC,  LACTICIDVEN Microbiology Recent Results (from the past 240 hour(s))  MRSA PCR Screening     Status: None   Collection Time: 03/17/16  3:30 PM  Result Value Ref Range Status   MRSA by PCR NEGATIVE NEGATIVE Final    Comment:        The GeneXpert MRSA Assay (FDA approved for NASAL specimens only), is one component of a comprehensive MRSA colonization surveillance program. It is not intended to diagnose MRSA infection nor to guide or monitor treatment for MRSA infections.      Time coordinating discharge: Over 30 minutes  SIGNED:   Elmarie Shiley, MD  [facility] 03/24/2016, 12:52 PM Pager   If 7PM-7AM, please contact night-coverage [email or web address] Password [email or web address]

## 2016-03-24 NOTE — Progress Notes (Signed)
Pt was given the discharge instruction esp changes in Metropolol dosage & all other medicine due to be taken at home & written in medicine list including the Rocephin IV which is due at 1800 today. Brother present at bedside.

## 2016-03-24 NOTE — Progress Notes (Signed)
Subjective:    Patient reports pain as 3 on 0-10 scale. I aspirated about 60cc of Fluid from his Right Knee and specimen was sent to the Lab for analysis for Gout. No signs of an infection. His knee was not red or warm. He is afebrile and his WBC is normal. He may be DCd and followed in our office.   Objective: Vital signs in last 24 hours: Temp:  [97.6 F (36.4 C)-99.5 F (37.5 C)] 98.6 F (37 C) (03/06 0451) Pulse Rate:  [92-116] 116 (03/06 0451) Resp:  [18] 18 (03/06 0451) BP: (106-125)/(62-72) 106/62 (03/06 0451) SpO2:  [97 %-100 %] 100 % (03/06 0451)  Intake/Output from previous day: 03/05 0701 - 03/06 0700 In: 2198 [I.V.:1800; Blood:348; IV Piggyback:50] Out: [id number] Intake/Output this shift: No intake/output data recorded.   Recent Labs  03/22/16 0406 03/23/16 0412 03/24/16 0407  HGB 8.7* 8.1* 9.4*    Recent Labs  03/23/16 0412 03/24/16 0407  WBC 8.8 9.0  RBC 2.60* 3.08*  HCT 23.5* 27.6*  PLT 191 193    Recent Labs  03/23/16 0412 03/24/16 0407  NA 135 135  K 3.8 4.0  CL 105 101  CO2 25 26  BUN 12 8  CREATININE 0.68 0.60*  GLUCOSE 104* 96  CALCIUM 8.5* 8.7*   No results for input(s): LABPT, INR in the last 72 hours.  No cellulitis present Compartment soft  Assessment/Plan:    Up with therapy full range of motion Ortho Standpoin,he may be DCd with his Brace. Use Brace ONLY when ambulating.  Ilea Hilton A 03/24/2016, 7:39 AM

## 2016-03-24 NOTE — Progress Notes (Signed)
Progress Note  Patient Name: Jonathan Holden Date of Encounter: 03/24/2016  Primary Cardiologist: Tamala Julian  Subjective   Feeling well today.  Hoping to go home soon.  Still having episodes of PAF  No symptoms related to AF  Having gout issues.  BP is ok   Inpatient Medications    Scheduled Meds: . cefTRIAXone (ROCEPHIN)  IV  2 g Intravenous Q24H  . Chlorhexidine Gluconate Cloth  6 each Topical Daily  . feeding supplement (ENSURE ENLIVE)  237 mL Oral BID BM  . ferrous sulfate  325 mg Oral BID WC  . lidocaine  1 patch Transdermal Q24H  . metoprolol tartrate  75 mg Oral BID  . pantoprazole  40 mg Intravenous Q12H  . polyethylene glycol  17 g Oral Daily  . saccharomyces boulardii  250 mg Oral BID  . senna-docusate  1 tablet Oral BID  . sodium chloride flush  3 mL Intravenous Q12H   Continuous Infusions: . sodium chloride 75 mL/hr at 03/23/16 1738   PRN Meds: metoprolol, ondansetron **OR** ondansetron (ZOFRAN) IV, oxyCODONE, sodium chloride flush   Vital Signs    Vitals:   03/23/16 2109 03/23/16 2348 03/24/16 0451 03/24/16 0946  BP: 116/70 113/66 106/62 (!) 131/110  Pulse: 98 92 (!) 116   Resp: 18 18 18    Temp: 99.5 F (37.5 C) 98.2 F (36.8 C) 98.6 F (37 C)   TempSrc: Oral Oral Oral   SpO2: 100% 97% 100%   Weight:      Height:        Intake/Output Summary (Last 24 hours) at 03/24/16 1155 Last data filed at 03/24/16 [id number]  Gross per 24 hour  Intake             2198 ml  Output             1375 ml  Net              823 ml   Filed Weights   03/17/16 1126 03/17/16 1600  Weight: 190 lb (86.2 kg) 190 lb 0.6 oz (86.2 kg)    Telemetry    SR with PVCs - Personally Reviewed  ECG    N/A- Personally Reviewed  Physical Exam   General: Well developed, well nourished, male appearing in no acute distress. Head: Normocephalic, atraumatic.  Neck: Supple without bruits, JVD. Lungs:  Resp regular and unlabored, CTA. Heart: Irreg. Irreg. , S1, S2, no S3, S4, or  murmur; no rub. Abdomen: Soft, non-tender, non-distended with normoactive bowel sounds. No hepatomegaly. No rebound/guarding. No obvious abdominal masses. Extremities: No clubbing, cyanosis, edema. Distal pedal pulses are 2+ bilaterally.   Neuro: Alert and oriented X 3. Moves all extremities spontaneously. Psych: Normal affect.  Labs    Chemistry  Recent Labs Lab 03/22/16 0411 03/23/16 0412 03/24/16 0407  NA 132* 135 135  K 3.4* 3.8 4.0  CL 100* 105 101  CO2 24 25 26   GLUCOSE 107* 104* 96  BUN 10 12 8   CREATININE 0.66 0.68 0.60*  CALCIUM 8.4* 8.5* 8.7*  GFRNONAA >60 >60 >60  GFRAA >60 >60 >60  ANIONGAP 8 5 8      Hematology  Recent Labs Lab 03/21/16 0526 03/22/16 0406 03/23/16 0412 03/24/16 0407  WBC 8.4  --  8.8 9.0  RBC 2.69*  --  2.60* 3.08*  HGB 8.3* 8.7* 8.1* 9.4*  HCT 24.0* 25.8* 23.5* 27.6*  MCV 89.2  --  90.4 89.6  MCH 30.9  --  31.2 30.5  MCHC 34.6  --  34.5 34.1  RDW 14.7  --  14.6 14.5  PLT 166  --  191 193    Cardiac EnzymesNo results for input(s): TROPONINI in the last 168 hours. No results for input(s): TROPIPOC in the last 168 hours.   BNPNo results for input(s): BNP, PROBNP in the last 168 hours.   DDimer No results for input(s): DDIMER in the last 168 hours.    Radiology    Dg Knee Complete 4 Views Right  Result Date: 03/23/2016 CLINICAL DATA:  Anterior right knee pain, swelling EXAM: RIGHT KNEE - COMPLETE 4+ VIEW COMPARISON:  None. FINDINGS: Degenerative changes with joint space narrowing and spurring in all 3 compartments. Large joint effusion. No acute bony abnormality. No fracture, subluxation or dislocation. IMPRESSION: Degenerative changes. Large joint effusion. No acute bony abnormality. Electronically Signed   By: Rolm Baptise M.D.   On: 03/23/2016 10:52    Cardiac Studies   N/A  Patient Profile     71 y.o. male with PMH of PAF, HTN, HL and CAD who presented with GI bleed requiring PRBCs.   Assessment & Plan    1.  Paroxysmal atrial fibrillation: It appears that he has been in atrial fibrillation for the last month or so. He has had a quite complicated month with multiple infections, back issues, GI bleeding from diverticuli. Likely his Afib is relates to recurrent illnesses and hospitalizations.  -- Unable to tolerate [place] with recurrent GI bleeding. Metoprolol was increased to 50mg  BID yesterday.  Will increase it to 75 mg PO BID  He will follow up with Dr. Tamala Julian in our office    2. Hypertension: BP has been well controlled  3. Hyperlipidemia: continue current managment  4. Coronary artery disease: no anginal symptoms. -- Continue atorvastatin, metoprolol.      Mertie Moores, MD  03/24/2016 11:58 AM    [address],  [address] [place], [place]  [address] Pager [phone] Phone: [phone]; Fax: [phone]

## 2016-03-25 ENCOUNTER — Ambulatory Visit (INDEPENDENT_AMBULATORY_CARE_PROVIDER_SITE_OTHER): Payer: Medicare Other | Admitting: Interventional Cardiology

## 2016-03-25 ENCOUNTER — Telehealth: Payer: Self-pay | Admitting: Interventional Cardiology

## 2016-03-25 ENCOUNTER — Encounter: Payer: Self-pay | Admitting: Interventional Cardiology

## 2016-03-25 VITALS — BP 90/56 | HR 80 | Ht 74.0 in | Wt 189.8 lb

## 2016-03-25 DIAGNOSIS — Z5309 Procedure and treatment not carried out because of other contraindication: Secondary | ICD-10-CM | POA: Insufficient documentation

## 2016-03-25 DIAGNOSIS — I2510 Atherosclerotic heart disease of native coronary artery without angina pectoris: Secondary | ICD-10-CM | POA: Diagnosis not present

## 2016-03-25 DIAGNOSIS — I4891 Unspecified atrial fibrillation: Secondary | ICD-10-CM

## 2016-03-25 DIAGNOSIS — Z452 Encounter for adjustment and management of vascular access device: Secondary | ICD-10-CM | POA: Diagnosis not present

## 2016-03-25 DIAGNOSIS — K922 Gastrointestinal hemorrhage, unspecified: Secondary | ICD-10-CM | POA: Diagnosis not present

## 2016-03-25 DIAGNOSIS — G9340 Encephalopathy, unspecified: Secondary | ICD-10-CM | POA: Diagnosis not present

## 2016-03-25 DIAGNOSIS — Z7901 Long term (current) use of anticoagulants: Secondary | ICD-10-CM | POA: Diagnosis not present

## 2016-03-25 DIAGNOSIS — I10 Essential (primary) hypertension: Secondary | ICD-10-CM

## 2016-03-25 DIAGNOSIS — R7881 Bacteremia: Secondary | ICD-10-CM | POA: Diagnosis not present

## 2016-03-25 DIAGNOSIS — M4646 Discitis, unspecified, lumbar region: Secondary | ICD-10-CM

## 2016-03-25 DIAGNOSIS — B955 Unspecified streptococcus as the cause of diseases classified elsewhere: Secondary | ICD-10-CM | POA: Diagnosis not present

## 2016-03-25 DIAGNOSIS — Z9229 Personal history of other drug therapy: Secondary | ICD-10-CM

## 2016-03-25 MED ORDER — AMIODARONE HCL 200 MG PO TABS: 200.0000 mg | ORAL_TABLET | Freq: Two times a day (BID) | ORAL | 3 refills | Status: DC

## 2016-03-25 MED ORDER — METOPROLOL TARTRATE 25 MG PO TABS: 25.0000 mg | ORAL_TABLET | Freq: Every day | ORAL | 3 refills | Status: DC

## 2016-03-25 NOTE — Progress Notes (Signed)
Cardiology Office Note    Date:  03/25/2016   ID:  Jonathan, Holden 09/30/1945, MRN [id number]  PCP:  Jonathan Grooms, MD  Cardiologist: Jonathan Grooms, MD   Chief Complaint  Patient presents with  . Atrial Fibrillation    History of Present Illness:  Jonathan Holden is a 71 y.o. male with a prior history of coronary artery disease, right coronary angioplasty during acute infarction, history of gastric ulcer, recurrent diverticular colonic bleeding episodes on eloquent's, recent diagnosis of atrial fibrillation during acute illness which is persistent, lumbar discitis with streptococcal bacteremia, hyperlipidemia, and hypertension.  Jonathan Holden is here accompanied by his brother. He is in a wheelchair. States he feels dyspneic and fatigued when he is up moving around. He has had recurrent hospitalizations because of fatigue, weakness, fever, chills, related to the problems noted above which all started with bacteremia/discitis. He presented initially also in atrial fibrillation. Duration is not known. A rate control strategy was chosen until adequate anticoagulation could occur to get the patient back in sinus rhythm. He has never had adequate anticoagulation because of recurring episodes of bleeding from diverticular disease in association with anticoagulation. He is currently on no anticoagulant.  The [place] VASC score = 3. Chronic anticoagulation therapy is desired. Annual stroke risk is a proximally 3.5-4.5 % per year.       Past Medical History:  Diagnosis Date  . Arthritis HANDS AND RIGHT SHOULDER  . Coronary artery disease CARDIOLOGIST- DR Jonathan Holden- LAST VISIT 6 MON AGO--  REQUESTED NOTE, EKG, ECHO, STRESS TEST   PT DENIES S & S  . H/O gastric ulcer   . History of MI (myocardial infarction) 1992  . Hyperlipidemia   . Hypertension   . Loose body of right shoulder   . S/P coronary angioplasty     Past Surgical History:  Procedure Laterality Date  . CARDIOVASCULAR  STRESS TEST  08-22-2008---  DR Jonathan Holden   NO ISCHEMIA.   Marland Kitchen CORONARY ANGIOPLASTY  1992  . ERCP W/ SPHICTEROTOMY  12-15-2002   AND STONE EXTRACTION  . LAPAROSCOPIC CHOLECYSTECTOMY  12-16-2002  . LUMBAR LAMINECTOMY  20 YRS AGO   L5  . TEE WITHOUT CARDIOVERSION N/A 02/28/2016   Procedure: TRANSESOPHAGEAL ECHOCARDIOGRAM (TEE);  Surgeon: Jonathan Records, MD;  [facility] ENDOSCOPY;  Service: Cardiovascular;  Laterality: N/A;    Current Medications: Outpatient Medications Prior to Visit  Medication Sig Dispense Refill  . cefTRIAXone (ROCEPHIN) IVPB Inject 2 g into the vein daily. Indication:  Diskitis, bacteremia Last Day of Therapy:  04/05/16 Labs - Once weekly:  CBC/D and BMP, Labs - Every other week:  ESR and CRP 27 Units 0  . feeding supplement, ENSURE ENLIVE, (ENSURE ENLIVE) LIQD Take 237 mLs by mouth 2 (two) times daily between meals. 237 mL 12  . ferrous sulfate 325 (65 FE) MG tablet Take 1 tablet (325 mg total) by mouth 2 (two) times daily with a meal. 30 tablet 3  . folic acid (FOLVITE) 1 MG tablet Take 1 tablet (1 mg total) by mouth daily. 30 tablet 0  . guaiFENesin (MUCINEX) 600 MG 12 hr tablet Take 1 tablet (600 mg total) by mouth 2 (two) times daily. (Patient taking differently: Take 600 mg by mouth 2 (two) times daily as needed for cough or to loosen phlegm. ) 30 tablet 0  . Melatonin 3 MG TABS Take 3 tablets (9 mg total) by mouth at bedtime as needed (sleep). 12 tablet 0  .  Metoprolol Tartrate 75 MG TABS Take 75 mg by mouth 2 (two) times daily. 60 tablet 0  . Multiple Vitamin (MULTIVITAMIN WITH MINERALS) TABS tablet Take 1 tablet by mouth daily.     Marland Kitchen oxyCODONE (OXY IR/ROXICODONE) 5 MG immediate release tablet Take 1 tablet (5 mg total) by mouth every 6 (six) hours as needed for severe pain. 30 tablet 0  . polyethylene glycol (MIRALAX / GLYCOLAX) packet Take 17 g by mouth daily. 14 each 0  . prednisoLONE acetate (PRED FORTE) 1 % ophthalmic suspension Place 1 drop into the left eye  daily. 5 mL 0  . saccharomyces boulardii (FLORASTOR) 250 MG capsule Take 1 capsule (250 mg total) by mouth 2 (two) times daily. 30 capsule 0  . senna-docusate (SENOKOT-S) 8.6-50 MG tablet Take 1 tablet by mouth 2 (two) times daily. 30 tablet 0  . atorvastatin (LIPITOR) 40 MG tablet Take 1 tablet (40 mg total) by mouth daily. (Patient not taking: Reported on 03/25/2016) 30 tablet 0   No facility-administered medications prior to visit.      Allergies:   Food and Ciprofloxacin   Social History   Social History  . Marital status: Divorced    Spouse name: N/A  . Number of children: N/A  . Years of education: N/A   Social History Main Topics  . Smoking status: Former Smoker    Packs/day: 2.00    Years: 20.00    Types: Cigarettes    Quit date: 03/27/1990  . Smokeless tobacco: Never Used  . Alcohol use 4.2 oz/week    7 Glasses of wine per week  . Drug use: No  . Sexual activity: Not Asked   Other Topics Concern  . None   Social History Narrative  . None     Family History:  The patient's family history includes Heart attack (age of onset: 51) in his father; Other in his brother and mother.   ROS:   Please see the history of present illness.    Diverticulosis with recurrent bleeding, fatigue, cough, shortness of breath, diarrhea, back pain, muscle pain, history of recurrent syncope associated with medications and atrial fibrillation related to GI bleeding. Easy bruising. Chills, fever, but increased appetite.  All other systems reviewed and are negative.   PHYSICAL EXAM:   VS:  BP (!) 90/56 (BP Location: Left Arm)   Pulse 80   Ht 6' 2" (1.88 m)   Wt 189 lb 12.8 oz (86.1 kg)   BMI 24.37 kg/m    Heart rhythm is irregularly irregular and on auscultation the heart rate is greater than 120 bpm.  GEN: Well nourished, well developed, in no acute distress  HEENT: normal  Neck: no JVD, carotid bruits, or masses Cardiac: IIRR; no murmurs, rubs, or gallops,no edema  Respiratory:   clear to auscultation bilaterally, normal work of breathing GI: soft, nontender, nondistended, + BS MS: no deformity or atrophy  Skin: warm and dry, no rash Neuro:  Alert and Oriented x 3, Strength and sensation are intact Psych: euthymic mood, full affect  Wt Readings from Last 3 Encounters:  03/25/16 189 lb 12.8 oz (86.1 kg)  03/17/16 190 lb 0.6 oz (86.2 kg)  03/12/16 190 lb 14.4 oz (86.6 kg)      Studies/Labs Reviewed:   EKG:  EKG  Not done  Recent Labs: 03/11/2016: TSH 1.148 03/17/2016: ALT 26 03/22/2016: Magnesium 1.7 03/24/2016: BUN 8; Creatinine, Ser 0.60; Hemoglobin 9.4; Platelets 193; Potassium 4.0; Sodium 135   Lipid Panel  Component Value Date/Time   CHOL 125 10/22/2014 1631   TRIG 111.0 10/22/2014 1631   HDL 55.30 10/22/2014 1631   CHOLHDL 2 10/22/2014 1631   VLDL 22.2 10/22/2014 1631   LDLCALC 48 10/22/2014 1631   LDLDIRECT 143.5 12/06/2012 0909    Additional studies/ Holden that were reviewed today include:  2-D Doppler echocardiogram performed 03/11/2016: Study Conclusions  - Left ventricle: The cavity size was normal. Wall thickness was   increased in a pattern of mild LVH. The estimated ejection   fraction was 55%. Wall motion was normal; there were no regional   wall motion abnormalities. Doppler parameters are consistent with   abnormal left ventricular relaxation (grade 1 diastolic   dysfunction). - Aortic valve: There was no stenosis. There was trivial   regurgitation. - Mitral valve: There was no significant regurgitation. - Right ventricle: The cavity size was normal. Systolic function   was normal. - Pulmonary arteries: No complete TR doppler jet so unable to   estimate PA systolic pressure. - Inferior vena cava: The vessel was normal in size. The   respirophasic diameter changes were in the normal range (= 50%),   consistent with normal central venous pressure.  Impressions:  - Normal LV size with mild LV hypertrophy. EF 55%. Normal  RV size   and systolic function. No significant valvular abnormalities.    ASSESSMENT:    1. Atrial fibrillation with RVR ([place])   2. Acute lower GI bleeding   3. Coronary artery disease involving native coronary artery of native heart without angina pectoris   4. Essential hypertension   5. HX: anticoagulation   6. Lumbar discitis   7. Streptococcal bacteremia      PLAN:  In order of problems listed above:  1. Atrial fibrillation with uncontrolled ventricular response and continued exertional fatigue, weakness, and dyspnea. At risk for rate related cardiomyopathy. Long discussion concerning atrial fibrillation management. He has been managed as a rate control strategy because of inadequate anticoagulation. He is currently not a candidate for anticoagulation because of recurrent lower gastrointestinal bleeding due to diverticulosis. He has had 3 separate bleeding episodes. Anticoagulants have been discontinued. He has run relatively low blood pressure since developing atrial fibrillation. He is at risk for the development of rate related cardiomyopathy. I recommend that we attempt to get the patient in rhythm. After discussion, we have decided to embark upon a rhythm control strategy. Today amiodarone 200 mg ttwice daily is started and will continue for one month. If he is having intermittent atrial fibrillation perhaps once loaded with this medication he will maintain sinus rhythm. If still in atrial fibrillation at the end of one month we will then consider performing a TEE guided electrical cardioversion, deciding not to convert the patient if there is large mobile thrombus present. If no large mobile thrombus present, cardioversion will be requested. I discussed with the patient that he has a continuous 1-6% risk of embolic CVA without anticoagulation. Once in sinus rhythm on amiodarone, we may be able to maintain sinus rhythm and hence decrease embolic risk. I believe the etiology of atrial  fibrillation was acute illness induced as his echo demonstrates normal left atrial size (by report). 2. Recurrent diverticular bleeding which she has had off and on over his life. There is a family history of diverticulosis with bleeding. Episodes became more severe on Eliquis, requiring transfusion..  3. Stable without angina. 4. Relatively low due to recurrent GI bleeding, volume contraction, and medication. 5. No future role  for anticoagulation unless the GI source of recurrent bleeding is cured. 6. Lumbar discitis with streptococcal bacteremia, currently being treated with daily IV antibiotic therapy.  Overall plan is to return in 3-4 weeks with an EKG. At that time decision will be made concerning possible TEE cardioversion. If large mobile thrombus is noted no cardioversion will be done. If there is not significant thrombus noted, he will be cardioverted despite absence of anticoagulation therapy.  Will also speak to pharmacy colleagues concerning re-starting Praluent therapy.   Medication Adjustments/Labs and Tests Ordered: Current medicines are reviewed at length with the patient today.  Concerns regarding medicines are outlined above.  Medication changes, Labs and Tests ordered today are listed in the Patient Instructions below. There are no Patient Instructions on file for this visit.   Signed, Jonathan Grooms, MD  03/25/2016 12:31 PM    [facility] [place], [place], [place]  [address] Phone: [phone]; Fax: [phone]

## 2016-03-25 NOTE — Op Note (Signed)
NAMEJONERIC, STREIGHT              ACCOUNT NO.:  [id number]  MEDICAL RECORD NO.:  [id number]  LOCATION:                                 FACILITY:  PHYSICIAN:  Kipp Brood. Xzavier Swinger, M.D.DATE OF BIRTH:  [birth year]  DATE OF PROCEDURE:  03/24/2016 DATE OF DISCHARGE:                              OPERATIVE REPORT   SURGEON:  Kipp Brood. Gladstone Lighter, M.D.  ASSISTANT:  Nurse.  This was a simple aspiration of the right knee on the floor.  I am dictating an operative note.  I did appropriate time-out with the nurse there.  His leg was obviously marked.  This is right knee.  HISTORY:  He had a significant knee effusion on the right.  X-ray showed arthritis.  No time during the hospitalization these show any signs of any infection.  It really did not appear to be gout, but the knee effusion was painful, so I elected to aspirate it.  OPERATIONS: 1. Aspiration right knee under local anesthesia. 2. Injection of 2 mL of Depo-Medrol, right knee.  DESCRIPTION OF PROCEDURE:  Under local anesthetic, I injected about 10 mL of 0.5% Marcaine plain into the soft tissue and knee.  Aspirated approximately 60 mL of very mildly cloudy fluid.  It could be gout. Certainly with no signs of any infection.  There was no erythema.  His knee was not hot.  He was afebrile, and his white count was normal. After aspirating the fluid, I sent the fluid to the lab for specimen for crystals and cell count.  I then injected the 2 mL of Depo-Medrol. Obviously, a sterile prep was carried out prior to doing this as well.  PLAN:  He will be placed in a knee brace and discharged from the hospital when he is ready from a medical standpoint.  I will follow him in the office and he will eventually need to have an MRI of his right knee.  There was no trauma involved.    ______________________________ Kipp Brood Brighid Koch, M.D.   ______________________________ Kipp Brood. Gladstone Lighter, M.D.   RAG/MEDQ  D:  03/24/2016  T:  03/24/2016   Job:  [id number]

## 2016-03-25 NOTE — Telephone Encounter (Signed)
New message    Pt is calling states he is returning a call to [place] or [place].

## 2016-03-25 NOTE — Patient Instructions (Addendum)
Medication Instructions:  1) DECREASE Metoprolol to 25mg - one tablet at night 2) START Amiodarone 200mg  twice daily  Labwork: None  Testing/Procedures: None  Follow-Up: Your physician recommends that you schedule a follow-up appointment in: 3-4 weeks with Dr. Tamala Julian. (Can have 3/22 at 10:15A or 3/27 at 10A)   Any Other Special Instructions Will Be Listed Below (If Applicable).     If you need a refill on your cardiac medications before your next appointment, please call your pharmacy.

## 2016-03-25 NOTE — Telephone Encounter (Signed)
Returned call to pt - he will pick up Praluent samples in about 30 minutes.

## 2016-03-27 ENCOUNTER — Encounter: Payer: Self-pay | Admitting: Infectious Disease

## 2016-03-27 DIAGNOSIS — K922 Gastrointestinal hemorrhage, unspecified: Secondary | ICD-10-CM | POA: Diagnosis not present

## 2016-03-27 DIAGNOSIS — M4640 Discitis, unspecified, site unspecified: Secondary | ICD-10-CM | POA: Diagnosis not present

## 2016-03-27 DIAGNOSIS — Z79899 Other long term (current) drug therapy: Secondary | ICD-10-CM | POA: Diagnosis not present

## 2016-03-27 DIAGNOSIS — R7881 Bacteremia: Secondary | ICD-10-CM | POA: Diagnosis not present

## 2016-03-27 DIAGNOSIS — G9340 Encephalopathy, unspecified: Secondary | ICD-10-CM | POA: Diagnosis not present

## 2016-03-27 DIAGNOSIS — M25561 Pain in right knee: Secondary | ICD-10-CM | POA: Diagnosis not present

## 2016-03-27 DIAGNOSIS — M4646 Discitis, unspecified, lumbar region: Secondary | ICD-10-CM | POA: Diagnosis not present

## 2016-03-27 DIAGNOSIS — I2510 Atherosclerotic heart disease of native coronary artery without angina pectoris: Secondary | ICD-10-CM | POA: Diagnosis not present

## 2016-03-27 DIAGNOSIS — B955 Unspecified streptococcus as the cause of diseases classified elsewhere: Secondary | ICD-10-CM | POA: Diagnosis not present

## 2016-03-27 DIAGNOSIS — Z452 Encounter for adjustment and management of vascular access device: Secondary | ICD-10-CM | POA: Diagnosis not present

## 2016-03-27 LAB — TYPE AND SCREEN
ABO/RH(D): B POS
Antibody Screen: NEGATIVE
UNIT DIVISION: 0
Unit division: 0

## 2016-03-27 LAB — BPAM RBC
BLOOD PRODUCT EXPIRATION DATE: 201804042359
BLOOD PRODUCT EXPIRATION DATE: 201804052359
ISSUE DATE / TIME: 201803052040
Unit Type and Rh: 7300
Unit Type and Rh: 7300

## 2016-03-28 DIAGNOSIS — M25561 Pain in right knee: Secondary | ICD-10-CM | POA: Diagnosis not present

## 2016-03-30 ENCOUNTER — Ambulatory Visit: Payer: Medicare Other | Admitting: Interventional Cardiology

## 2016-03-31 DIAGNOSIS — M4646 Discitis, unspecified, lumbar region: Secondary | ICD-10-CM | POA: Diagnosis not present

## 2016-03-31 DIAGNOSIS — Z452 Encounter for adjustment and management of vascular access device: Secondary | ICD-10-CM | POA: Diagnosis not present

## 2016-03-31 DIAGNOSIS — R7881 Bacteremia: Secondary | ICD-10-CM | POA: Diagnosis not present

## 2016-03-31 DIAGNOSIS — B955 Unspecified streptococcus as the cause of diseases classified elsewhere: Secondary | ICD-10-CM | POA: Diagnosis not present

## 2016-03-31 DIAGNOSIS — G9340 Encephalopathy, unspecified: Secondary | ICD-10-CM | POA: Diagnosis not present

## 2016-03-31 DIAGNOSIS — A419 Sepsis, unspecified organism: Secondary | ICD-10-CM | POA: Diagnosis not present

## 2016-03-31 DIAGNOSIS — I2510 Atherosclerotic heart disease of native coronary artery without angina pectoris: Secondary | ICD-10-CM | POA: Diagnosis not present

## 2016-04-01 ENCOUNTER — Telehealth: Payer: Self-pay | Admitting: Interventional Cardiology

## 2016-04-01 ENCOUNTER — Encounter: Payer: Self-pay | Admitting: Internal Medicine

## 2016-04-01 ENCOUNTER — Telehealth: Payer: Self-pay | Admitting: *Deleted

## 2016-04-01 ENCOUNTER — Ambulatory Visit (INDEPENDENT_AMBULATORY_CARE_PROVIDER_SITE_OTHER): Payer: Medicare Other | Admitting: Internal Medicine

## 2016-04-01 VITALS — BP 128/82 | HR 97 | Temp 97.8°F | Ht 74.0 in | Wt 180.0 lb

## 2016-04-01 DIAGNOSIS — M4646 Discitis, unspecified, lumbar region: Secondary | ICD-10-CM | POA: Diagnosis not present

## 2016-04-01 DIAGNOSIS — I2510 Atherosclerotic heart disease of native coronary artery without angina pectoris: Secondary | ICD-10-CM

## 2016-04-01 DIAGNOSIS — A491 Streptococcal infection, unspecified site: Secondary | ICD-10-CM

## 2016-04-01 DIAGNOSIS — M25561 Pain in right knee: Secondary | ICD-10-CM

## 2016-04-01 LAB — CBC WITH DIFFERENTIAL/PLATELET
BASOS ABS: 0 {cells}/uL (ref 0–200)
Basophils Relative: 0 %
EOS PCT: 1 %
Eosinophils Absolute: 140 cells/uL (ref 15–500)
HEMATOCRIT: 34 % — AB (ref 38.5–50.0)
HEMOGLOBIN: 11.2 g/dL — AB (ref 13.2–17.1)
LYMPHS ABS: 1120 {cells}/uL (ref 850–3900)
Lymphocytes Relative: 8 %
MCH: 30.1 pg (ref 27.0–33.0)
MCHC: 32.9 g/dL (ref 32.0–36.0)
MCV: 91.4 fL (ref 80.0–100.0)
MONO ABS: 980 {cells}/uL — AB (ref 200–950)
MPV: 8.6 fL (ref 7.5–12.5)
Monocytes Relative: 7 %
NEUTROS PCT: 84 %
Neutro Abs: 11760 cells/uL — ABNORMAL HIGH (ref 1500–7800)
Platelets: 314 10*3/uL (ref 140–400)
RBC: 3.72 MIL/uL — ABNORMAL LOW (ref 4.20–5.80)
RDW: 14.8 % (ref 11.0–15.0)
WBC: 14 10*3/uL — ABNORMAL HIGH (ref 3.8–10.8)

## 2016-04-01 NOTE — Telephone Encounter (Signed)
New message   Jonathan Holden calling from [facility] stating that Pt wants to discontinue his PT and OT but continuing with nursing.

## 2016-04-01 NOTE — Telephone Encounter (Signed)
New Message  Anderson Malta voiced pts brother wants an order for speech therapy due to pts cognitive issues.

## 2016-04-01 NOTE — Progress Notes (Signed)
RFV: hospital follow up  Patient ID: Jonathan Holden, male   DOB: [birth year], 72 y.o.   MRN: [id number]  HPI 71yo M with CAD, HTN, HLD, pAFib, complicated medical course in the last 2 months. This  first stems for with acute on chronic back pain in setting of fevers. He was found to have streptococcal bacteremia with L4-L5, L5-S1 lumbar discitis. His TEE was negative for endocarditis. Source thought to be odontogenic due to lucency noted on right posterior molar mandible.He was evaluated by Dr Christella Noa who felt no surgical intervention at that time but recommended medical treatment. Plan to treat for 6 wk of ceftriaxone using 3/18 as end date.- he was iniitally hospitalized from 2/2- 2/9, then went to acute rehab. He had epsidoe of near syncope that prompted readmission from 2/20-2/22, symptoms were attributed to hypotension due to overmedications with beta-blockers for afib.   He was then readmitted on 2/27 to 3/6  for massive gi bleed suspected divertiucular bleed exacerbated by eliquis. During his hospitalization, he had a knee effusion after working with PT. He had aspirate on 3/6, which showed 11K with 93% N. He was continued on ceftriaxone, nad discharged home.  The patient is still very deconditioned. Has limited time working with PT since his knee has significant pain. He had MRI yesterday of his right knee by dr Rushie Nyhan to see if any meniscal tear. He feels that the swelling he once had in his knee is improved  He has had roughly 15-20# weight loss since all this occurred.  Home health did not due sed rate and crp this week    Sed rate on 3/9 is 74 and crp of 34. This week's labs have not been done  2 torn meniscus of lateral and medial  Outpatient Encounter Prescriptions as of 04/01/2016  Medication Sig  . Alirocumab (PRALUENT) 150 MG/ML SOPN Inject 1 pen into the skin every 14 (fourteen) days.  Marland Kitchen amiodarone (PACERONE) 200 MG tablet Take 1 tablet (200 mg total) by mouth 2  (two) times daily.  Marland Kitchen atorvastatin (LIPITOR) 40 MG tablet Take 40 mg by mouth at bedtime.  . cefTRIAXone (ROCEPHIN) IVPB Inject 2 g into the vein daily. Indication:  Diskitis, bacteremia Last Day of Therapy:  04/05/16 Labs - Once weekly:  CBC/D and BMP, Labs - Every other week:  ESR and CRP  . feeding supplement, ENSURE ENLIVE, (ENSURE ENLIVE) LIQD Take 237 mLs by mouth 2 (two) times daily between meals.  . ferrous sulfate 325 (65 FE) MG tablet Take 1 tablet (325 mg total) by mouth 2 (two) times daily with a meal.  . folic acid (FOLVITE) 1 MG tablet Take 1 tablet (1 mg total) by mouth daily.  Marland Kitchen guaiFENesin (MUCINEX) 600 MG 12 hr tablet Take 1 tablet (600 mg total) by mouth 2 (two) times daily. (Patient taking differently: Take 600 mg by mouth 2 (two) times daily as needed for cough or to loosen phlegm. )  . Melatonin 3 MG TABS Take 3 tablets (9 mg total) by mouth at bedtime as needed (sleep).  . metoprolol tartrate (LOPRESSOR) 25 MG tablet Take 1 tablet (25 mg total) by mouth at bedtime.  . Multiple Vitamin (MULTIVITAMIN WITH MINERALS) TABS tablet Take 1 tablet by mouth daily.   Marland Kitchen oxyCODONE (OXY IR/ROXICODONE) 5 MG immediate release tablet Take 1 tablet (5 mg total) by mouth every 6 (six) hours as needed for severe pain.  . polyethylene glycol (MIRALAX / GLYCOLAX) packet Take  17 g by mouth daily.  . prednisoLONE acetate (PRED FORTE) 1 % ophthalmic suspension Place 1 drop into the left eye daily.  Marland Kitchen saccharomyces boulardii (FLORASTOR) 250 MG capsule Take 1 capsule (250 mg total) by mouth 2 (two) times daily.  Marland Kitchen senna-docusate (SENOKOT-S) 8.6-50 MG tablet Take 1 tablet by mouth 2 (two) times daily.   No facility-administered encounter medications on file as of 04/01/2016.      Patient Active Problem List   Diagnosis Date Noted  . HX: anticoagulation 03/25/2016  . Acute lower GI bleeding 03/19/2016  . Hypotension 03/11/2016  . Near syncope 03/10/2016  . Gastroesophageal reflux disease   .  Acute blood loss anemia   . Pulmonary hypertension   . Ileus ([place])   . Endocarditis of native valve   . Infection of intervertebral disc ([place])   . Streptococcal bacteremia   . Lumbar discitis   . Atrial fibrillation with RVR ([place]) 02/21/2016  . Acute encephalopathy 02/21/2016  . CAD (coronary artery disease), native coronary artery 09/19/2014  . Essential hypertension 09/19/2014  . Hyperlipidemia 10/03/2013     Health Maintenance Due  Topic Date Due  . TETANUS/TDAP  07/31/1963  . PNA vac Low Risk Adult (1 of 2 - PCV13) 07/30/2009  . INFLUENZA VACCINE  08/20/2015     Review of Systems Per hpi, back pain, poor appetite, weight loss. Generalized weakness Physical Exam   BP 128/82   Pulse 97   Temp 97.8 F (36.6 C) (Oral)   Ht 6' 2"  (1.88 m)   Wt 180 lb (81.6 kg)   BMI 23.11 kg/m    Constitutional: He is oriented to person, place, and time. He appears older than stated age and under-nourished. No distress.  HENT:  Mouth/Throat: Oropharynx is clear and moist. No oropharyngeal exudate.  Cardiovascular: Normal rate, regular rhythm and normal heart sounds. Exam reveals no gallop and no friction rub.  No murmur heard.  Pulmonary/Chest: Effort normal and breath sounds normal. No respiratory distress. He has no wheezes.  Abdominal: Soft. Bowel sounds are normal. He exhibits no distension. There is no tenderness.  Lymphadenopathy:  He has no cervical adenopathy.  Neurological: He is alert and oriented to person, place, and time.  Skin: Skin is warm and dry. No rash noted. No erythema.  Psychiatric: He has a normal mood and affect. His behavior is normal.    CBC Lab Results  Component Value Date   WBC 9.0 03/24/2016   RBC 3.08 (L) 03/24/2016   HGB 9.4 (L) 03/24/2016   HCT 27.6 (L) 03/24/2016   PLT 193 03/24/2016   MCV 89.6 03/24/2016   MCH 30.5 03/24/2016   MCHC 34.1 03/24/2016   RDW 14.5 03/24/2016   LYMPHSABS 1.2 03/10/2016   MONOABS 1.1 (H) 03/10/2016   EOSABS 0.2  03/10/2016    BMET Lab Results  Component Value Date   NA 135 03/24/2016   K 4.0 03/24/2016   CL 101 03/24/2016   CO2 26 03/24/2016   GLUCOSE 96 03/24/2016   BUN 8 03/24/2016   CREATININE 0.60 (L) 03/24/2016   CALCIUM 8.7 (L) 03/24/2016   GFRNONAA >60 03/24/2016   GFRAA >60 03/24/2016   Lab Results  Component Value Date   ESRSEDRATE 94 (H) 04/01/2016   Lab Results  Component Value Date   CRP 105.4 (H) 04/01/2016   Lab Results  Component Value Date   WBC 14.0 (H) 04/01/2016   HGB 11.2 (L) 04/01/2016   HCT 34.0 (L) 04/01/2016   MCV  91.4 04/01/2016   PLT 314 04/01/2016    Assessment and Plan  Streptococcal lumbar discitis = the MRI was done as an outpatient initially, with prelim read of discitis. He is nearing completion of 6 wks. I would like to see his inflammatory markers to see if improving - will check sed rate and crp to decide if need to extend abtx by 2 more weeks  Addendum = elevated since last checked on the 3/8. Concern for undertreatment ? Abscess. Or other source of infection - will communicate orders to advance on 3/15 to extend iv abtx by 2 more weeks - Spoke with Dr Gladstone Lighter who is also concerned about elevated WBC and for repeating mri of spine - await MRI findings to refer to [place] caries = unclear if they had been addressed in the hospital. Recommend that he sees dentistry   Possible cognitive impairment = he freely admits that he is not completely clear with all the events that have occurred on his varioius hospitalizaiton. 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 I suspect that this serious illness has unmasked his mild cognitive impairment htat he previously was able to cope sufficiently  Deconditioning = recommend to do activity as tolerated. Increase nutritional intake  - recommended to see dentistry  Right knee pain = defer to dr Gladstone Lighter for next  intervention  Spent 40 min patient in direct counseling and treatment plan regarding discitis/streptococcal infection

## 2016-04-01 NOTE — Telephone Encounter (Signed)
Jonathan Holden PT [facility] called to report that [name] is refusing services (to participate) in PT,OT but is accepting the [facility].

## 2016-04-01 NOTE — Telephone Encounter (Signed)
Spoke with Jonathan Holden and made her aware that pt should be establishing with a PCP and that Dr. Tamala Julian is not signing [facility] orders.  Jonathan Holden said she is aware and that she just wanted to make Dr. Tamala Julian aware that pt was no longer participating in PT and OT.  Advised I would send message.

## 2016-04-01 NOTE — Telephone Encounter (Signed)
Spoke with Dr. Tamala Julian and he said he needs to have a Primary Care physician established and have them follow this as this should not come from Cardiology.  Spoke with Anderson Malta at [facility] and advised her.  She was appreciative for call back.

## 2016-04-02 ENCOUNTER — Telehealth: Payer: Self-pay | Admitting: *Deleted

## 2016-04-02 DIAGNOSIS — S83271A Complex tear of lateral meniscus, current injury, right knee, initial encounter: Secondary | ICD-10-CM | POA: Diagnosis not present

## 2016-04-02 DIAGNOSIS — M545 Low back pain: Secondary | ICD-10-CM | POA: Diagnosis not present

## 2016-04-02 DIAGNOSIS — A419 Sepsis, unspecified organism: Secondary | ICD-10-CM | POA: Diagnosis not present

## 2016-04-02 DIAGNOSIS — B955 Unspecified streptococcus as the cause of diseases classified elsewhere: Secondary | ICD-10-CM | POA: Diagnosis not present

## 2016-04-02 DIAGNOSIS — M4646 Discitis, unspecified, lumbar region: Secondary | ICD-10-CM | POA: Diagnosis not present

## 2016-04-02 DIAGNOSIS — S83231A Complex tear of medial meniscus, current injury, right knee, initial encounter: Secondary | ICD-10-CM | POA: Diagnosis not present

## 2016-04-02 DIAGNOSIS — Z452 Encounter for adjustment and management of vascular access device: Secondary | ICD-10-CM | POA: Diagnosis not present

## 2016-04-02 DIAGNOSIS — M25561 Pain in right knee: Secondary | ICD-10-CM | POA: Diagnosis not present

## 2016-04-02 DIAGNOSIS — G9340 Encephalopathy, unspecified: Secondary | ICD-10-CM | POA: Diagnosis not present

## 2016-04-02 DIAGNOSIS — I2510 Atherosclerotic heart disease of native coronary artery without angina pectoris: Secondary | ICD-10-CM | POA: Diagnosis not present

## 2016-04-02 DIAGNOSIS — R7881 Bacteremia: Secondary | ICD-10-CM | POA: Diagnosis not present

## 2016-04-02 LAB — SEDIMENTATION RATE: SED RATE: 94 mm/h — AB (ref 0–20)

## 2016-04-02 LAB — C-REACTIVE PROTEIN: CRP: 105.4 mg/L — AB (ref <8.0)

## 2016-04-02 NOTE — Telephone Encounter (Signed)
Verbal order per Dr Baxter Flattery to extend IV antibiotics ceftriaxone 2mg  daily for 2 more weeks relayed to [facility] at [place]. Repeated and verified.  Dr. Charlestine Night office will be coordinating MRI per Dr Baxter Flattery. Patient's girlfriend Mollie notified of plan. Landis Gandy, RN

## 2016-04-03 ENCOUNTER — Telehealth: Payer: Self-pay | Admitting: *Deleted

## 2016-04-03 DIAGNOSIS — M545 Low back pain: Secondary | ICD-10-CM | POA: Diagnosis not present

## 2016-04-03 NOTE — Telephone Encounter (Signed)
Patient left message in triage asking for Dr Baxter Flattery to refer him to [facility].  RN returned the call to clarify if patient is asking for neurology or neurosurgery.  Advised patient to ask for neurosurgery referral from current neurosurgeon, as they would need to release all records during referral process.  Advised patient that a neurology referral may best come from his primary care doctor. RN asked patient to call back with clarification. Jonathan Gandy, RN

## 2016-04-03 NOTE — Telephone Encounter (Signed)
Patient called back, RN explained. He will contact [facility] and [facility] for the referral and all notes.

## 2016-04-06 DIAGNOSIS — M545 Low back pain: Secondary | ICD-10-CM | POA: Diagnosis not present

## 2016-04-07 ENCOUNTER — Inpatient Hospital Stay: Payer: Medicare Other | Admitting: Physical Medicine & Rehabilitation

## 2016-04-07 ENCOUNTER — Telehealth: Payer: Self-pay | Admitting: *Deleted

## 2016-04-07 ENCOUNTER — Telehealth: Payer: Self-pay | Admitting: Interventional Cardiology

## 2016-04-07 ENCOUNTER — Encounter: Payer: Medicare Other | Attending: Physical Medicine & Rehabilitation

## 2016-04-07 DIAGNOSIS — M4646 Discitis, unspecified, lumbar region: Secondary | ICD-10-CM | POA: Diagnosis not present

## 2016-04-07 NOTE — Telephone Encounter (Signed)
New message      Pt saw Dr Tamala Julian on 03-25-16 and he wanted to see him again in 3-4 weeks.  The return appt was scheduled in 2wks.  Is this ok?

## 2016-04-07 NOTE — Telephone Encounter (Signed)
PreVisit Call attempted. Left VM requesting return call. Was also going to ask pt to schedule AWV after seeing Dr Juleen China 04/13/16.

## 2016-04-07 NOTE — Telephone Encounter (Signed)
Follow up      Pt request a callback from the pharmacist at her convenience

## 2016-04-07 NOTE — Telephone Encounter (Signed)
Noted will plan to come talk to him once he finishes with Dr. Tamala Julian and go over injection technique.

## 2016-04-07 NOTE — Telephone Encounter (Signed)
Spoke with pt and advised ok to keep sooner appt or I can move him out to 4/9.  Pt prefers to keep sooner appt as he has some questions.    While speaking with pt he mentioned that he was given two Praulent samples at time of last visit and they failed.  He said medicine would not come out.  Pt would like to get more samples on Thursday when he is here to see Dr. Tamala Julian and have pharmacist come in and watch him do the shot to make sure he is doing it correctly.  Advised I would send message to pharmacist and make them aware.  Pt appreciative for call.  Pt also unsure of whether or not to have lipid panel drawn at this time.

## 2016-04-07 NOTE — Telephone Encounter (Signed)
Patient left message in triage asking for RN to return the call. RN called back, left voicemail asking patient to call back if he still needed my assistance. Landis Gandy, RN

## 2016-04-08 DIAGNOSIS — Z452 Encounter for adjustment and management of vascular access device: Secondary | ICD-10-CM | POA: Diagnosis not present

## 2016-04-08 DIAGNOSIS — B955 Unspecified streptococcus as the cause of diseases classified elsewhere: Secondary | ICD-10-CM | POA: Diagnosis not present

## 2016-04-08 DIAGNOSIS — R7881 Bacteremia: Secondary | ICD-10-CM | POA: Diagnosis not present

## 2016-04-08 DIAGNOSIS — I2510 Atherosclerotic heart disease of native coronary artery without angina pectoris: Secondary | ICD-10-CM | POA: Diagnosis not present

## 2016-04-08 DIAGNOSIS — M4646 Discitis, unspecified, lumbar region: Secondary | ICD-10-CM | POA: Diagnosis not present

## 2016-04-08 DIAGNOSIS — G9340 Encephalopathy, unspecified: Secondary | ICD-10-CM | POA: Diagnosis not present

## 2016-04-08 NOTE — Telephone Encounter (Signed)
RN left message letting patient know that Dr Baxter Flattery has been in touch with Dr Gladstone Lighter, will continue to manage his antibiotics. Landis Gandy, RN

## 2016-04-08 NOTE — Telephone Encounter (Signed)
I spoke to dr. Gladstone Lighter regarding Jonathan Holden. The patient has followed up with Dr Christella Noa from [facility] who has evaluated his discitis and recommended non-surgical approach. Though this is different opinion than that an orthopedic back surgeon would do, dr Gladstone Lighter will only be managing mr. Okray's knee problem and defer back issues to [facility]. Too many surgeons getting involved-- I favor this plan so that it doesn't give mix messages to the patient.gioffre - will follow the knee. [facility]- to follow the back and ID -to manage abtx

## 2016-04-09 ENCOUNTER — Telehealth: Payer: Self-pay | Admitting: *Deleted

## 2016-04-09 ENCOUNTER — Encounter: Payer: Self-pay | Admitting: Interventional Cardiology

## 2016-04-09 ENCOUNTER — Ambulatory Visit (INDEPENDENT_AMBULATORY_CARE_PROVIDER_SITE_OTHER): Payer: Medicare Other | Admitting: Interventional Cardiology

## 2016-04-09 VITALS — BP 120/70 | HR 83 | Ht 74.0 in | Wt 184.8 lb

## 2016-04-09 DIAGNOSIS — R7881 Bacteremia: Secondary | ICD-10-CM | POA: Diagnosis not present

## 2016-04-09 DIAGNOSIS — Z419 Encounter for procedure for purposes other than remedying health state, unspecified: Secondary | ICD-10-CM | POA: Diagnosis not present

## 2016-04-09 DIAGNOSIS — I209 Angina pectoris, unspecified: Secondary | ICD-10-CM

## 2016-04-09 DIAGNOSIS — Z7901 Long term (current) use of anticoagulants: Secondary | ICD-10-CM | POA: Diagnosis not present

## 2016-04-09 DIAGNOSIS — G9340 Encephalopathy, unspecified: Secondary | ICD-10-CM | POA: Diagnosis not present

## 2016-04-09 DIAGNOSIS — I25118 Atherosclerotic heart disease of native coronary artery with other forms of angina pectoris: Secondary | ICD-10-CM | POA: Diagnosis not present

## 2016-04-09 DIAGNOSIS — M4646 Discitis, unspecified, lumbar region: Secondary | ICD-10-CM | POA: Diagnosis not present

## 2016-04-09 DIAGNOSIS — D62 Acute posthemorrhagic anemia: Secondary | ICD-10-CM | POA: Diagnosis not present

## 2016-04-09 DIAGNOSIS — I2510 Atherosclerotic heart disease of native coronary artery without angina pectoris: Secondary | ICD-10-CM | POA: Diagnosis not present

## 2016-04-09 DIAGNOSIS — I10 Essential (primary) hypertension: Secondary | ICD-10-CM | POA: Diagnosis not present

## 2016-04-09 DIAGNOSIS — Z452 Encounter for adjustment and management of vascular access device: Secondary | ICD-10-CM | POA: Diagnosis not present

## 2016-04-09 DIAGNOSIS — I4891 Unspecified atrial fibrillation: Secondary | ICD-10-CM

## 2016-04-09 DIAGNOSIS — B955 Unspecified streptococcus as the cause of diseases classified elsewhere: Secondary | ICD-10-CM | POA: Diagnosis not present

## 2016-04-09 DIAGNOSIS — Z79899 Other long term (current) drug therapy: Secondary | ICD-10-CM

## 2016-04-09 DIAGNOSIS — Z9229 Personal history of other drug therapy: Secondary | ICD-10-CM

## 2016-04-09 MED ORDER — AMIODARONE HCL 200 MG PO TABS: 200.0000 mg | ORAL_TABLET | Freq: Every day | ORAL | 3 refills | Status: DC

## 2016-04-09 NOTE — Telephone Encounter (Signed)
Spoke with patient while in office today to see Dr. Tamala Julian. He demonstrated appropriate injection technique. Samples provided. Pt aware to call when he needs more samples.

## 2016-04-09 NOTE — Telephone Encounter (Signed)
Patient called triage to ask if his antibiotic therapy needed to be extended/changed due to his most recent MRI reports.  Please advise.  Current stop date is 4/1. Landis Gandy, RN

## 2016-04-09 NOTE — Progress Notes (Signed)
Cardiology Office Note    Date:  04/09/2016   ID:  Juda, Toepfer [birth year], MRN [id number]  PCP:  Briscoe Deutscher, DO  Cardiologist: Sinclair Grooms, MD   Chief Complaint  Patient presents with  . Coronary Artery Disease  . Atrial Fibrillation  . Congestive Heart Failure    History of Present Illness:  Jonathan Holden is a 71 y.o. male with a prior history of coronary artery disease, right coronary angioplasty during acute infarction 1990's, history of gastric ulcer, recurrent diverticular colonic bleeding episodes on Eliquis, recent diagnosis of atrial fibrillation during acute illness for which Eliquis was started, lumbar discitis with streptococcal bacteremia, hyperlipidemia, and hypertension. When atrial fibrillation is present he is very symptomatic with low blood pressure and diastolic heart failure. Recent initiation of amiodarone therapy and back today for clinical follow-up.  Accompanied by his fiance. Feels better. No medication side effects. Denies dyspnea. Decreased memory. No recurrent bleeding. He is off anticoagulation therapy because of recurrent diverticular bleeding requiring transfusion.   Past Medical History:  Diagnosis Date  . Arthritis HANDS AND RIGHT SHOULDER  . Coronary artery disease CARDIOLOGIST- DR Daneen Schick- LAST VISIT 6 MON AGO--  REQUESTED NOTE, EKG, ECHO, STRESS TEST   PT DENIES S & S  . H/O gastric ulcer   . History of MI (myocardial infarction) 1992  . Hyperlipidemia   . Hypertension   . Loose body of right shoulder   . S/P coronary angioplasty     Past Surgical History:  Procedure Laterality Date  . CARDIOVASCULAR STRESS TEST  08-22-2008---  DR Daneen Schick   NO ISCHEMIA.   Marland Kitchen CORONARY ANGIOPLASTY  1992  . ERCP W/ SPHICTEROTOMY  12-15-2002   AND STONE EXTRACTION  . LAPAROSCOPIC CHOLECYSTECTOMY  12-16-2002  . LUMBAR LAMINECTOMY  20 YRS AGO   L5  . TEE WITHOUT CARDIOVERSION N/A 02/28/2016   Procedure: TRANSESOPHAGEAL  ECHOCARDIOGRAM (TEE);  Surgeon: Fay Records, MD;  Location: [facility] ENDOSCOPY;  Service: Cardiovascular;  Laterality: N/A;    Current Medications: Outpatient Medications Prior to Visit  Medication Sig Dispense Refill  . Alirocumab (PRALUENT) 150 MG/ML SOPN Inject 1 pen into the skin every 14 (fourteen) days.    Marland Kitchen atorvastatin (LIPITOR) 40 MG tablet Take 40 mg by mouth at bedtime.    . folic acid (FOLVITE) 1 MG tablet Take 1 tablet (1 mg total) by mouth daily. 30 tablet 0  . Melatonin 3 MG TABS Take 3 tablets (9 mg total) by mouth at bedtime as needed (sleep). 12 tablet 0  . metoprolol tartrate (LOPRESSOR) 25 MG tablet Take 1 tablet (25 mg total) by mouth at bedtime. 90 tablet 3  . Multiple Vitamin (MULTIVITAMIN WITH MINERALS) TABS tablet Take 1 tablet by mouth daily.     Marland Kitchen oxyCODONE (OXY IR/ROXICODONE) 5 MG immediate release tablet Take 1 tablet (5 mg total) by mouth every 6 (six) hours as needed for severe pain. 30 tablet 0  . senna-docusate (SENOKOT-S) 8.6-50 MG tablet Take 1 tablet by mouth 2 (two) times daily. 30 tablet 0  . amiodarone (PACERONE) 200 MG tablet Take 1 tablet (200 mg total) by mouth 2 (two) times daily. 180 tablet 3  . feeding supplement, ENSURE ENLIVE, (ENSURE ENLIVE) LIQD Take 237 mLs by mouth 2 (two) times daily between meals. (Patient not taking: Reported on 04/09/2016) 237 mL 12  . ferrous sulfate 325 (65 FE) MG tablet Take 1 tablet (325 mg total) by mouth 2 (two) times daily with  a meal. (Patient not taking: Reported on 04/09/2016) 30 tablet 3  . guaiFENesin (MUCINEX) 600 MG 12 hr tablet Take 1 tablet (600 mg total) by mouth 2 (two) times daily. (Patient not taking: Reported on 04/09/2016) 30 tablet 0  . polyethylene glycol (MIRALAX / GLYCOLAX) packet Take 17 g by mouth daily. (Patient not taking: Reported on 04/09/2016) 14 each 0  . prednisoLONE acetate (PRED FORTE) 1 % ophthalmic suspension Place 1 drop into the left eye daily. (Patient not taking: Reported on 04/09/2016) 5 mL 0    . saccharomyces boulardii (FLORASTOR) 250 MG capsule Take 1 capsule (250 mg total) by mouth 2 (two) times daily. (Patient not taking: Reported on 04/09/2016) 30 capsule 0   No facility-administered medications prior to visit.      Allergies:   Food and Ciprofloxacin   Social History   Social History  . Marital status: Divorced    Spouse name: N/A  . Number of children: N/A  . Years of education: N/A   Social History Main Topics  . Smoking status: Former Smoker    Packs/day: 2.00    Years: 20.00    Types: Cigarettes    Quit date: 03/27/1990  . Smokeless tobacco: Never Used  . Alcohol use 4.2 oz/week    7 Glasses of wine per week  . Drug use: No  . Sexual activity: Not Asked   Other Topics Concern  . None   Social History Narrative  . None     Family History:  The patient's family history includes Heart attack (age of onset: 21) in his father; Other in his brother and mother.   ROS:   Please see the history of present illness.    Back discomfort and possibly recurrent infection.  All other systems reviewed and are negative.   PHYSICAL EXAM:   VS:  BP 120/70 (BP Location: Left Arm)   Pulse 83   Ht 6\' 2"  (1.88 m)   Wt 184 lb 12.8 oz (83.8 kg)   BMI 23.73 kg/m    GEN: Well nourished, well developed, in no acute distress  HEENT: normal  Neck: no JVD, carotid bruits, or masses Cardiac: RRR; no murmurs, rubs, or gallops,no edema  Respiratory:  clear to auscultation bilaterally, normal work of breathing GI: soft, nontender, nondistended, + BS MS: no deformity or atrophy  Skin: warm and dry, no rash Neuro:  Alert and Oriented x 3, Strength and sensation are intact Psych: euthymic mood, full affect  Wt Readings from Last 3 Encounters:  04/09/16 184 lb 12.8 oz (83.8 kg)  04/01/16 180 lb (81.6 kg)  03/25/16 189 lb 12.8 oz (86.1 kg)      Studies/Labs Reviewed:   EKG:  EKG  Normal sinus rhythm, left atrial abnormality, otherwise no acute change. Compared to prior  tracing, atrial fibrillation has resolved.  Recent Labs: 03/11/2016: TSH 1.148 03/17/2016: ALT 26 03/22/2016: Magnesium 1.7 03/24/2016: BUN 8; Creatinine, Ser 0.60; Potassium 4.0; Sodium 135 04/01/2016: Hemoglobin 11.2; Platelets 314   Lipid Panel    Component Value Date/Time   CHOL 125 10/22/2014 1631   TRIG 111.0 10/22/2014 1631   HDL 55.30 10/22/2014 1631   CHOLHDL 2 10/22/2014 1631   VLDL 22.2 10/22/2014 1631   LDLCALC 48 10/22/2014 1631   LDLDIRECT 143.5 12/06/2012 0909    Additional studies/ records that were reviewed today include:  None  ASSESSMENT:    1. Atrial fibrillation with RVR ([place])   2. Coronary artery disease of native artery of native  heart with stable angina pectoris ([place])   3. On amiodarone therapy   4. Essential hypertension   5. HX: anticoagulation   6. Acute blood loss anemia      PLAN:  In order of problems listed above:  1. Atrial fib has reverted to normal sinus rhythm after 2 weeks of amiodarone 400 mg per day. Continue an additional 2 weeks of amiodarone therapy at the current level then decrease to 200 mg daily. Clinical follow-up in 6-8 weeks. TSH and hepatic panel will be done at that time. We discussed the role of amiodarone and potential toxicity including liver, lung, and thyroid. We also discussed the importance of maintaining sinus rhythm, which allows [place] to more safely continue off anticoagulation therapy. 2. Asymptomatic without evidence of infarction or angina during acute illness. 3. Tolerating amiodarone as noted above. Plan to decrease to 200 mg per day after 2 additional weeks. Clinical follow-up in 6-8 weeks with an EKG. Potential toxicity associated with amiodarone was discussed in detail. Rationale for therapy was also discussed. Clinically much improved with sinus rhythm. Blood pressure is much improved. 4. Blood pressure is under good control. It is substantially higher now that he is back in sinus rhythm. 5. Recurrent GI bleeding  occurred from diverticulosis in the setting of anticoagulation therapy for atrial fibrillation. Anticoagulant therapy has been discontinued.  Continue current therapy. Decrease amiodarone to 200 mg daily in 2 weeks. Clinical follow-up in 6 weeks with EKG. Notify them that they should call if resting heart rates greater than 110 bpm a noted even if the patient is asymptomatic.  Medication Adjustments/Labs and Tests Ordered: Current medicines are reviewed at length with the patient today.  Concerns regarding medicines are outlined above.  Medication changes, Labs and Tests ordered today are listed in the Patient Instructions below. Patient Instructions  Medication Instructions:  1) In two weeks, DECREASE Amiodarone to 200mg  once daily.  Labwork: None  Testing/Procedures: None  Follow-Up: Your physician recommends that you schedule a follow-up appointment in: 6-8 weeks wit Dr. Tamala Julian.   Any Other Special Instructions Will Be Listed Below (If Applicable).  Monitor your heart rate.  If it is consistently over 105, please contact our office.    If you need a refill on your cardiac medications before your next appointment, please call your pharmacy.      Signed, Sinclair Grooms, MD  04/09/2016 11:38 AM    [facility], [place],   [address] Phone: [phone]; Fax: [phone]

## 2016-04-09 NOTE — Patient Instructions (Signed)
Medication Instructions:  1) In two weeks, DECREASE Amiodarone to 200mg  once daily.  Labwork: None  Testing/Procedures: None  Follow-Up: Your physician recommends that you schedule a follow-up appointment in: 6-8 weeks wit Dr. Tamala Julian.   Any Other Special Instructions Will Be Listed Below (If Applicable).  Monitor your heart rate.  If it is consistently over 105, please contact our office.    If you need a refill on your cardiac medications before your next appointment, please call your pharmacy.

## 2016-04-10 DIAGNOSIS — B955 Unspecified streptococcus as the cause of diseases classified elsewhere: Secondary | ICD-10-CM | POA: Diagnosis not present

## 2016-04-10 DIAGNOSIS — I2510 Atherosclerotic heart disease of native coronary artery without angina pectoris: Secondary | ICD-10-CM | POA: Diagnosis not present

## 2016-04-10 DIAGNOSIS — Z452 Encounter for adjustment and management of vascular access device: Secondary | ICD-10-CM | POA: Diagnosis not present

## 2016-04-10 DIAGNOSIS — M4646 Discitis, unspecified, lumbar region: Secondary | ICD-10-CM | POA: Diagnosis not present

## 2016-04-10 DIAGNOSIS — R7881 Bacteremia: Secondary | ICD-10-CM | POA: Diagnosis not present

## 2016-04-10 DIAGNOSIS — G9340 Encephalopathy, unspecified: Secondary | ICD-10-CM | POA: Diagnosis not present

## 2016-04-13 ENCOUNTER — Ambulatory Visit (INDEPENDENT_AMBULATORY_CARE_PROVIDER_SITE_OTHER): Payer: Medicare Other | Admitting: Family Medicine

## 2016-04-13 ENCOUNTER — Encounter: Payer: Self-pay | Admitting: Family Medicine

## 2016-04-13 VITALS — BP 130/82 | HR 101 | Temp 98.1°F | Ht 74.0 in | Wt 186.4 lb

## 2016-04-13 DIAGNOSIS — I209 Angina pectoris, unspecified: Secondary | ICD-10-CM | POA: Diagnosis not present

## 2016-04-13 DIAGNOSIS — I2510 Atherosclerotic heart disease of native coronary artery without angina pectoris: Secondary | ICD-10-CM

## 2016-04-13 DIAGNOSIS — M4630 Infection of intervertebral disc (pyogenic), site unspecified: Secondary | ICD-10-CM

## 2016-04-13 DIAGNOSIS — I25118 Atherosclerotic heart disease of native coronary artery with other forms of angina pectoris: Secondary | ICD-10-CM

## 2016-04-13 DIAGNOSIS — I480 Paroxysmal atrial fibrillation: Secondary | ICD-10-CM

## 2016-04-13 DIAGNOSIS — Z5309 Procedure and treatment not carried out because of other contraindication: Secondary | ICD-10-CM

## 2016-04-13 DIAGNOSIS — D62 Acute posthemorrhagic anemia: Secondary | ICD-10-CM

## 2016-04-13 DIAGNOSIS — Z452 Encounter for adjustment and management of vascular access device: Secondary | ICD-10-CM | POA: Diagnosis not present

## 2016-04-13 DIAGNOSIS — M4646 Discitis, unspecified, lumbar region: Secondary | ICD-10-CM | POA: Diagnosis not present

## 2016-04-13 DIAGNOSIS — R7881 Bacteremia: Secondary | ICD-10-CM | POA: Diagnosis not present

## 2016-04-13 DIAGNOSIS — G9340 Encephalopathy, unspecified: Secondary | ICD-10-CM | POA: Diagnosis not present

## 2016-04-13 DIAGNOSIS — B955 Unspecified streptococcus as the cause of diseases classified elsewhere: Secondary | ICD-10-CM | POA: Diagnosis not present

## 2016-04-13 DIAGNOSIS — R29898 Other symptoms and signs involving the musculoskeletal system: Secondary | ICD-10-CM | POA: Diagnosis not present

## 2016-04-13 LAB — CBC WITH DIFFERENTIAL/PLATELET
Basophils Absolute: 0 10*3/uL (ref 0.0–0.1)
Basophils Relative: 0.4 % (ref 0.0–3.0)
Eosinophils Absolute: 0.1 10*3/uL (ref 0.0–0.7)
Eosinophils Relative: 0.6 % (ref 0.0–5.0)
HCT: 34.2 % — ABNORMAL LOW (ref 39.0–52.0)
Hemoglobin: 11.3 g/dL — ABNORMAL LOW (ref 13.0–17.0)
Lymphocytes Relative: 9.2 % — ABNORMAL LOW (ref 12.0–46.0)
Lymphs Abs: 1 10*3/uL (ref 0.7–4.0)
MCHC: 33.1 g/dL (ref 30.0–36.0)
MCV: 89.4 fl (ref 78.0–100.0)
Monocytes Absolute: 0.8 10*3/uL (ref 0.1–1.0)
Monocytes Relative: 7 % (ref 3.0–12.0)
Neutro Abs: 9.3 10*3/uL — ABNORMAL HIGH (ref 1.4–7.7)
Neutrophils Relative %: 82.8 % — ABNORMAL HIGH (ref 43.0–77.0)
Platelets: 352 10*3/uL (ref 150.0–400.0)
RBC: 3.82 Mil/uL — ABNORMAL LOW (ref 4.22–5.81)
RDW: 15 % (ref 11.5–15.5)
WBC: 11.2 10*3/uL — ABNORMAL HIGH (ref 4.0–10.5)

## 2016-04-13 NOTE — Progress Notes (Signed)
Pre visit review using our clinic review tool, if applicable. No additional management support is needed unless otherwise documented below in the visit note. 

## 2016-04-13 NOTE — Progress Notes (Addendum)
 Jonathan Holden is a 70 y.o. male is here to ESTABLISH CARE.   History of Present Illness:   Jonathan Holden CMA acting as scribe for Dr. .  HPI:  Jonathan Holden is a pleasant gentleman that is establishing with me today. He has had a complicated history more recently, with ongoing issues. He would like help with coordination of care and to have a primary care home.   His multiple ER visits and hospitalizations were reviewed. Problem list updated. SEE AP.  1. Streptoccocal bacteremia. L4-L5, L5-S1 lumbar discitis. TEE negative for endocarditis. Possible odontogenic source. On CTX IV with course recommendations by Dr. Snider.    2. Paroxysmal atrial fibrillation with RVR (HCC). Converted with Amiodorone. Noted to be extremely symptomatic - hypotensive, diastolic CHF when in atrial fibrillation with RVR.    3. Muscular deconditioning. Since recent medical issues (over the last few months). Has HH PT/OT in place. Nutrition needs may not be met.   4. Contraindication to anticoagulation therapy. Multiple lower GI bleeds due to Eliquis.     Health Maintenance Due  Topic Date Due  . TETANUS/TDAP  07/31/1963  . PNA vac Low Risk Adult (1 of 2 - PCV13) 07/30/2009   PMHx, SurgHx, SocialHx, Medications, and Allergies were reviewed in the Visit Navigator and updated as appropriate.   Past Medical History:  Diagnosis Date  . Acute blood loss anemia   . Acute lower GI bleeding 03/19/2016  . Arthritis   . Atrial fibrillation with RVR (HCC) 02/21/2016  . Coronary artery disease   . Diverticulosis 04/18/2016  . Endocarditis of native valve   . Former smoker   . H/O gastric ulcer   . History of MI (myocardial infarction) 1992  . Hyperlipidemia   . Hypertension   . Infection of intervertebral disc (HCC)   . Insomnia 04/18/2016  . Left retinal detachment 03/18/2015  . Loose body of right shoulder   . 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 Pseudophakia of left eye  05/22/2013  . Pseudophakia of right eye 06/05/2013  . S/P coronary angioplasty   . Streptococcal bacteremia    Past Surgical History:  Procedure Laterality Date  . CARDIOVASCULAR STRESS TEST  08/22/2008  . CORONARY ANGIOPLASTY  1992  . ERCP W/ SPHICTEROTOMY  12/15/2002  . LAPAROSCOPIC CHOLECYSTECTOMY  12/16/2002  . LUMBAR LAMINECTOMY    . TEE WITHOUT CARDIOVERSION N/A 02/28/2016   Family History  Problem Relation Age of Onset  . Other Mother     AGE 92 HEALTHY  . Heart attack Father 62    2 MI  . Emphysema    . Other Brother     HEALTHY   Social History  Substance Use Topics  . Smoking status: Former Smoker    Packs/day: 2.00    Years: 20.00    Types: Cigarettes    Quit date: 03/27/1990  . Smokeless tobacco: Never Used  . Alcohol use 4.2 oz/week    7 Glasses of wine per week   Current Medications and Allergies:   .  Alirocumab (PRALUENT) 150 MG/ML SOPN, Inject 1 pen into the skin every 14 (fourteen) days., Disp: , Rfl:  .  atorvastatin (LIPITOR) 40 MG tablet, Take 40 mg by mouth at bedtime., Disp: , Rfl:  .  cefTRIAXone (ROCEPHIN) 10 g injection, Take as directed., Disp: , Rfl:  .  Melatonin 3 MG TABS, Take 3 tablets (9 mg total) by mouth at bedtime as needed (sleep)., Disp: 12 tablet, Rfl: 0 .    metoprolol tartrate (LOPRESSOR) 25 MG tablet, Take 1 tablet (25 mg total) by mouth at bedtime., Disp: 90 tablet, Rfl: 3 .  Multiple Vitamin (MULTIVITAMIN WITH MINERALS) TABS tablet, Take 1 tablet by mouth daily. , Disp: , Rfl:  .  oxyCODONE (OXY IR/ROXICODONE) 5 MG immediate release tablet, Take 1 tablet (5 mg total) by mouth every 6 (six) hours as needed for severe pain., Disp: 30 tablet, Rfl: 0 .  senna-docusate (SENOKOT-S) 8.6-50 MG tablet, Take 1 tablet by mouth 2 (two) times daily., Disp: 30 tablet, Rfl: 0  Allergies  Allergen Reactions  . 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   Reaction:  Unknown   . Ciprofloxacin Hives   Review of Systems:   Review of  Systems  Constitutional: Positive for malaise/fatigue. Negative for chills, fever and weight loss.  Eyes: Negative for blurred vision and double vision.  Respiratory: Negative for cough, shortness of breath and wheezing.   Cardiovascular: Negative for chest pain, palpitations and leg swelling.  Gastrointestinal: Negative for abdominal pain, blood in stool, constipation, diarrhea, heartburn, melena, nausea and vomiting.  Genitourinary: Negative for dysuria.  Musculoskeletal: Positive for myalgias.  Skin: Negative for rash.  Neurological: Positive for weakness. Negative for dizziness, focal weakness, seizures, loss of consciousness and headaches.  Endo/Heme/Allergies: Does not bruise/bleed easily.  Psychiatric/Behavioral: Positive for depression. Negative for hallucinations, memory loss, substance abuse and suicidal ideas. The patient is not nervous/anxious.    Vitals:   Vitals:   04/13/16 1108  BP: 130/82  Pulse: (!) 101  Temp: 98.1 F (36.7 C)  TempSrc: Oral  SpO2: 97%  Weight: 186 lb 6.4 oz (84.6 kg)  Height: 6' 2" (1.88 m)     Body mass index is 23.93 kg/m.   Physical Exam:   Physical Exam  Constitutional: He is oriented to person, place, and time. He appears well-developed and well-nourished. No distress.  HENT:  Head: Normocephalic and atraumatic.  Right Ear: External ear normal.  Left Ear: External ear normal.  Nose: Nose normal.  Mouth/Throat: Oropharynx is clear and moist.  Eyes: Conjunctivae and EOM are normal. Pupils are equal, round, and reactive to light.  Neck: Normal range of motion. Neck supple.  Cardiovascular: Normal rate, regular rhythm, normal heart sounds and intact distal pulses.   Pulmonary/Chest: Effort normal and breath sounds normal.  Abdominal: Soft. Bowel sounds are normal.  Lymphadenopathy:    He has axillary adenopathy.       Right axillary: Pectoral adenopathy present.  Neurological: He is alert and oriented to person, place, and time. He  has normal reflexes.  General deconditioning, using a walker. No focal findings.  Skin: Skin is warm and dry.     Psychiatric: He has a normal mood and affect. His behavior is normal. Judgment and thought content normal.  Nursing note and vitals reviewed.    Results for orders placed or performed in visit on 04/13/16  CBC with Differential/Platelet  Result Value Ref Range   WBC 11.2 (H) 4.0 - 10.5 K/uL   RBC 3.82 (L) 4.22 - 5.81 Mil/uL   Hemoglobin 11.3 (L) 13.0 - 17.0 g/dL   HCT 34.2 (L) 39.0 - 52.0 %   MCV 89.4 78.0 - 100.0 fl   MCHC 33.1 30.0 - 36.0 g/dL   RDW 15.0 11.5 - 15.5 %   Platelets 352.0 150.0 - 400.0 K/uL   Neutrophils Relative % 82.8 (H) 43.0 - 77.0 %   Lymphocytes Relative 9.2 (L) 12.0 - 46.0 %  Monocytes Relative 7.0 3.0 - 12.0 %   Eosinophils Relative 0.6 0.0 - 5.0 %   Basophils Relative 0.4 0.0 - 3.0 %   Neutro Abs 9.3 (H) 1.4 - 7.7 K/uL   Lymphs Abs 1.0 0.7 - 4.0 K/uL   Monocytes Absolute 0.8 0.1 - 1.0 K/uL   Eosinophils Absolute 0.1 0.0 - 0.7 K/uL   Basophils Absolute 0.0 0.0 - 0.1 K/uL    Assessment and Plan:    Lequan was seen today for establish care.  Diagnoses and all orders for this visit:  Infection of intervertebral disc (HCC) Comments: WBC improving. Vitals stable. Question for ID will be how long patient will need to remain on IV CTX. Today's CBC will be available for review. Note: CRP/SED mistakenly not added to labs today.  CBC Latest Ref Rng & Units 04/13/2016 04/01/2016 03/24/2016  WBC 4.0 - 10.5 K/uL 11.2(H) 14.0(H) 9.0  Hemoglobin 13.0 - 17.0 g/dL 11.3(L) 11.2(L) 9.4(L)  Hematocrit 39.0 - 52.0 % 34.2(L) 34.0(L) 27.6(L)  Platelets 150.0 - 400.0 K/uL 352.0 314 193   Orders: -     CBC with Differential/Platelet  Acute blood loss anemia Comments: Improving as above.  Orders: -     CBC with Differential/Platelet  Coronary artery disease of native artery of native heart with stable angina pectoris (HCC) Comments: No red flags  today.  Paroxysmal atrial fibrillation with RVR (HCC) Comments: RRR today without issues. Continue Metoprolol, Amiodarone per Cardiology.   Muscular deconditioning Comments: Patient has [facility] PT/OT 1-2/week. Endorses improvement.   Contraindication to anticoagulation therapy Comments: Lower GI bleed due to Eliquis. I went ahead and added it to the patient's allergy list as a contraidication for safety.    . Reviewed expectations re: course of current medical issues. . Discussed self-management of symptoms. . Outlined signs and symptoms indicating need for more acute intervention. . Patient verbalized understanding and all questions were answered. . See orders for this visit as documented in the electronic medical record. . Patient received an After Visit Summary.  Records requested if needed. I spent 45 minutes with this patient, greater than 50% was face-to-face time counseling regarding the above diagnoses.  CMA served as scribe during this visit. History, Physical, and Plan performed by medical provider. Documentation and orders reviewed and attested to.  , D.O.   , D.O. [place], [place] 04/18/2016  Follow-up: No Follow-up on file.  No orders of the defined types were placed in this encounter.  Medications Discontinued During This Encounter  Medication Reason  . amiodarone (PACERONE) 200 MG tablet Error  . ferrous sulfate (KP FERROUS SULFATE) 325 (65 FE) MG tablet Error  . folic acid (FOLVITE) 1 MG tablet Error   Orders Placed This Encounter  Procedures  . CBC with Differential/Platelet   

## 2016-04-14 DIAGNOSIS — Z452 Encounter for adjustment and management of vascular access device: Secondary | ICD-10-CM | POA: Diagnosis not present

## 2016-04-14 DIAGNOSIS — I2510 Atherosclerotic heart disease of native coronary artery without angina pectoris: Secondary | ICD-10-CM | POA: Diagnosis not present

## 2016-04-14 DIAGNOSIS — G9340 Encephalopathy, unspecified: Secondary | ICD-10-CM | POA: Diagnosis not present

## 2016-04-14 DIAGNOSIS — M4646 Discitis, unspecified, lumbar region: Secondary | ICD-10-CM | POA: Diagnosis not present

## 2016-04-14 DIAGNOSIS — B955 Unspecified streptococcus as the cause of diseases classified elsewhere: Secondary | ICD-10-CM | POA: Diagnosis not present

## 2016-04-14 DIAGNOSIS — R7881 Bacteremia: Secondary | ICD-10-CM | POA: Diagnosis not present

## 2016-04-15 ENCOUNTER — Telehealth: Payer: Self-pay | Admitting: *Deleted

## 2016-04-15 ENCOUNTER — Encounter: Payer: Self-pay | Admitting: *Deleted

## 2016-04-15 DIAGNOSIS — I2510 Atherosclerotic heart disease of native coronary artery without angina pectoris: Secondary | ICD-10-CM | POA: Diagnosis not present

## 2016-04-15 DIAGNOSIS — Z452 Encounter for adjustment and management of vascular access device: Secondary | ICD-10-CM | POA: Diagnosis not present

## 2016-04-15 DIAGNOSIS — B955 Unspecified streptococcus as the cause of diseases classified elsewhere: Secondary | ICD-10-CM | POA: Diagnosis not present

## 2016-04-15 DIAGNOSIS — G9340 Encephalopathy, unspecified: Secondary | ICD-10-CM | POA: Diagnosis not present

## 2016-04-15 DIAGNOSIS — R7881 Bacteremia: Secondary | ICD-10-CM | POA: Diagnosis not present

## 2016-04-15 DIAGNOSIS — M4646 Discitis, unspecified, lumbar region: Secondary | ICD-10-CM | POA: Diagnosis not present

## 2016-04-15 NOTE — Telephone Encounter (Signed)
Patient called with updates.  He reports that he saw his dentist and 1 tooth was identified as a potential source of infection. This is scheduled to be pulled on 4/19. He also has had another MRI spine, approximately 3/18 at [facility].  He will bring the CD today to have it added to his chart.  He has given 1 copy to Dr Christella Noa. Landis Gandy, RN

## 2016-04-16 ENCOUNTER — Telehealth: Payer: Self-pay | Admitting: *Deleted

## 2016-04-16 ENCOUNTER — Encounter: Payer: Self-pay | Admitting: Internal Medicine

## 2016-04-16 DIAGNOSIS — G9340 Encephalopathy, unspecified: Secondary | ICD-10-CM | POA: Diagnosis not present

## 2016-04-16 DIAGNOSIS — I2510 Atherosclerotic heart disease of native coronary artery without angina pectoris: Secondary | ICD-10-CM | POA: Diagnosis not present

## 2016-04-16 DIAGNOSIS — R7881 Bacteremia: Secondary | ICD-10-CM | POA: Diagnosis not present

## 2016-04-16 DIAGNOSIS — M4646 Discitis, unspecified, lumbar region: Secondary | ICD-10-CM | POA: Diagnosis not present

## 2016-04-16 DIAGNOSIS — B955 Unspecified streptococcus as the cause of diseases classified elsewhere: Secondary | ICD-10-CM | POA: Diagnosis not present

## 2016-04-16 DIAGNOSIS — Z452 Encounter for adjustment and management of vascular access device: Secondary | ICD-10-CM | POA: Diagnosis not present

## 2016-04-16 NOTE — Telephone Encounter (Signed)
Have them do bone cx if possible. Thank you

## 2016-04-16 NOTE — Progress Notes (Deleted)
Pre visit review using our clinic review tool, if applicable. No additional management support is needed unless otherwise documented below in the visit note. 

## 2016-04-16 NOTE — Telephone Encounter (Signed)
Office note and letter faxed to patient's dentist per his request.  CD of most recent MRI dropped off at Radiology department for upload into the system. Landis Gandy, RN

## 2016-04-16 NOTE — Progress Notes (Deleted)
Subjective:   Jonathan Holden is a 71 y.o. male who presents for an Initial Medicare Annual Wellness Visit.  Review of Systems  No ROS.  Medicare Wellness Visit.   Sleep patterns:  Home Safety/Smoke Alarms:   Living environment; residence and Firearm Safety: . Seat Belt Safety/Bike Helmet: Wears seat belt.   Counseling:   Eye Exam-  Dental-  Male:   CCS-     PSA-     Objective:    There were no vitals filed for this visit. There is no height or weight on file to calculate BMI.  Current Medications (verified) Outpatient Encounter Prescriptions as of 04/20/2016  Medication Sig  . Alirocumab (PRALUENT) 150 MG/ML SOPN Inject 1 pen into the skin every 14 (fourteen) days.  Marland Kitchen atorvastatin (LIPITOR) 40 MG tablet Take 40 mg by mouth at bedtime.  . cefTRIAXone (ROCEPHIN) 10 g injection Take as directed.  . Melatonin 3 MG TABS Take 3 tablets (9 mg total) by mouth at bedtime as needed (sleep).  . metoprolol tartrate (LOPRESSOR) 25 MG tablet Take 1 tablet (25 mg total) by mouth at bedtime.  . Multiple Vitamin (MULTIVITAMIN WITH MINERALS) TABS tablet Take 1 tablet by mouth daily.   Marland Kitchen oxyCODONE (OXY IR/ROXICODONE) 5 MG immediate release tablet Take 1 tablet (5 mg total) by mouth every 6 (six) hours as needed for severe pain.  Marland Kitchen senna-docusate (SENOKOT-S) 8.6-50 MG tablet Take 1 tablet by mouth 2 (two) times daily.   No facility-administered encounter medications on file as of 04/20/2016.     Allergies (verified) Food and Ciprofloxacin   History: Past Medical History:  Diagnosis Date  . Arthritis HANDS AND RIGHT SHOULDER  . Coronary artery disease CARDIOLOGIST- DR Jonathan Holden- LAST VISIT 6 MON AGO--  REQUESTED NOTE, EKG, ECHO, STRESS TEST   PT DENIES S & S  . H/O gastric ulcer   . History of MI (myocardial infarction) 1992  . Hyperlipidemia   . Hypertension   . Loose body of right shoulder   . S/P coronary angioplasty    Past Surgical History:  Procedure Laterality Date  .  CARDIOVASCULAR STRESS TEST  08-22-2008---  DR Jonathan Holden   NO ISCHEMIA.   Marland Kitchen CORONARY ANGIOPLASTY  1992  . ERCP W/ SPHICTEROTOMY  12-15-2002   AND STONE EXTRACTION  . LAPAROSCOPIC CHOLECYSTECTOMY  12-16-2002  . LUMBAR LAMINECTOMY  20 YRS AGO   L5  . TEE WITHOUT CARDIOVERSION N/A 02/28/2016   Procedure: TRANSESOPHAGEAL ECHOCARDIOGRAM (TEE);  Surgeon: Jonathan Records, MD;  Location: [facility] ENDOSCOPY;  Service: Cardiovascular;  Laterality: N/A;   Family History  Problem Relation Age of Onset  . Other Mother     AGE 52 HEALTHY  . Heart attack Father 70    2 MI  . Emphysema    . Other Brother     HEALTHY   Social History   Occupational History  . Not on file.   Social History Main Topics  . Smoking status: Former Smoker    Packs/day: 2.00    Years: 20.00    Types: Cigarettes    Quit date: 03/27/1990  . Smokeless tobacco: Never Used  . Alcohol use 4.2 oz/week    7 Glasses of wine per week  . Drug use: No  . Sexual activity: Not Currently   Tobacco Counseling Counseling given: Not Answered   Activities of Daily Living In your present state of health, do you have any difficulty performing the following activities: 03/17/2016 03/11/2016  Hearing? N  N  Vision? N N  Difficulty concentrating or making decisions? N N  Walking or climbing stairs? Y Y  Dressing or bathing? Y Y  Doing errands, shopping? Jonathan Holden  Some recent data might be hidden    Immunizations and Health Maintenance Immunization History  Administered Date(s) Administered  . Influenza-Unspecified 11/20/2015   Health Maintenance Due  Topic Date Due  . TETANUS/TDAP  07/31/1963  . PNA vac Low Risk Adult (1 of 2 - PCV13) 07/30/2009    Patient Care Team: Jonathan Deutscher, DO as PCP - General (Family Medicine)  Indicate any recent Medical Services you may have received from other than [facility] providers in the past year (date may be approximate).    Assessment:   This is a routine wellness examination for Jonathan Holden. Physical  assessment deferred to PCP.  Hearing/Vision screen No exam data present  Dietary issues and exercise activities discussed:   Diet (meal preparation, eat out, water intake, caffeinated beverages, dairy products, fruits and vegetables): {Desc; diets:16563} Breakfast: Lunch:  Dinner:      Goals    . LDL CALC < 70      Depression Screen PHQ 2/9 Scores 04/13/2016  PHQ - 2 Score 0    Fall Risk Fall Risk  04/13/2016  Falls in the past year? Yes  Number falls in past yr: 1  Injury with Fall? Yes  Risk Factor Category  High Fall Risk  Risk for fall due to : History of fall(s);Impaired balance/gait;Impaired mobility    Cognitive Function:        Screening Tests Health Maintenance  Topic Date Due  . TETANUS/TDAP  07/31/1963  . PNA vac Low Risk Adult (1 of 2 - PCV13) 07/30/2009  . COLONOSCOPY  04/09/2024  . INFLUENZA VACCINE  Completed  . Hepatitis C Screening  Completed        Plan:     During the course of the visit Jonathan Holden was educated and counseled about the following appropriate screening and preventive services:   Vaccines to include Pneumoccal, Influenza, Hepatitis B, Td, Zostavax, HCV  Colorectal cancer screening  Cardiovascular disease screening  Diabetes screening  Glaucoma screening  Nutrition counseling  Prostate cancer screening  Smoking cessation counseling  Patient Instructions (the written plan) were given to the patient.   Jonathan Edman, RN   04/16/2016

## 2016-04-16 NOTE — Telephone Encounter (Signed)
Verbal order given to Melissa at [place] to extend IV antibiotics through 05/17/16 per Dr. Baxter Flattery.

## 2016-04-16 NOTE — Telephone Encounter (Signed)
Angie, RN called for order to pull picc line, stop date is 04/19/16. Per Dr. Baxter Flattery she needs to review upcoming labs before making a decision. Labs to be drawn tomorrow and gave Dr. Storm Frisk pager. Angie's phone [phone].

## 2016-04-16 NOTE — Telephone Encounter (Signed)
Patient requesting a phone call from Dr Baxter Flattery. They have multiple questions. Jonathan Holden [phone]

## 2016-04-16 NOTE — Telephone Encounter (Signed)
Relayed information to patient. Care team updated with his dentist, oral surgeon, and neurosurgeon.

## 2016-04-17 ENCOUNTER — Other Ambulatory Visit: Payer: Self-pay | Admitting: Internal Medicine

## 2016-04-17 ENCOUNTER — Ambulatory Visit
Admission: RE | Admit: 2016-04-17 | Discharge: 2016-04-17 | Disposition: A | Discharge status: Home / Self Care | Payer: Self-pay | Source: Ambulatory Visit | Attending: Internal Medicine | Admitting: Internal Medicine

## 2016-04-17 DIAGNOSIS — R52 Pain, unspecified: Secondary | ICD-10-CM

## 2016-04-18 ENCOUNTER — Encounter: Payer: Self-pay | Admitting: Family Medicine

## 2016-04-18 DIAGNOSIS — Z87891 Personal history of nicotine dependence: Secondary | ICD-10-CM | POA: Insufficient documentation

## 2016-04-18 DIAGNOSIS — M169 Osteoarthritis of hip, unspecified: Secondary | ICD-10-CM | POA: Insufficient documentation

## 2016-04-18 DIAGNOSIS — K5790 Diverticulosis of intestine, part unspecified, without perforation or abscess without bleeding: Secondary | ICD-10-CM | POA: Insufficient documentation

## 2016-04-18 DIAGNOSIS — M2550 Pain in unspecified joint: Secondary | ICD-10-CM | POA: Insufficient documentation

## 2016-04-18 DIAGNOSIS — R29898 Other symptoms and signs involving the musculoskeletal system: Secondary | ICD-10-CM | POA: Insufficient documentation

## 2016-04-18 DIAGNOSIS — M1990 Unspecified osteoarthritis, unspecified site: Secondary | ICD-10-CM | POA: Insufficient documentation

## 2016-04-18 DIAGNOSIS — I5033 Acute on chronic diastolic (congestive) heart failure: Secondary | ICD-10-CM | POA: Insufficient documentation

## 2016-04-18 DIAGNOSIS — Z8719 Personal history of other diseases of the digestive system: Secondary | ICD-10-CM | POA: Insufficient documentation

## 2016-04-18 DIAGNOSIS — G4700 Insomnia, unspecified: Secondary | ICD-10-CM | POA: Insufficient documentation

## 2016-04-18 DIAGNOSIS — Z8711 Personal history of peptic ulcer disease: Secondary | ICD-10-CM | POA: Insufficient documentation

## 2016-04-18 HISTORY — DX: Diverticulosis of intestine, part unspecified, without perforation or abscess without bleeding: K57.90

## 2016-04-18 HISTORY — DX: Insomnia, unspecified: G47.00

## 2016-04-18 MED ORDER — AMIODARONE HCL 200 MG PO TABS: 200.0000 mg | ORAL_TABLET | Freq: Every day | ORAL | 3 refills | Status: DC

## 2016-04-18 NOTE — Patient Instructions (Signed)
I will see you again next week with your fiance. Come in early to meet with our Health Coach for a Wellness Visit first.

## 2016-04-18 NOTE — Progress Notes (Signed)
  UPDATED Current Outpatient Prescriptions:  .  Alirocumab (PRALUENT) 150 MG/ML SOPN, Inject 1 pen into the skin every 14 (fourteen) days., Disp: , Rfl:  .  atorvastatin (LIPITOR) 40 MG tablet, Take 40 mg by mouth at bedtime., Disp: , Rfl:  .  cefTRIAXone (ROCEPHIN) 10 g injection, Take as directed., Disp: , Rfl:  .  Melatonin 3 MG TABS, Take 3 tablets (9 mg total) by mouth at bedtime as needed (sleep)., Disp: 12 tablet, Rfl: 0 .  metoprolol tartrate (LOPRESSOR) 25 MG tablet, Take 1 tablet (25 mg total) by mouth at bedtime., Disp: 90 tablet, Rfl: 3 .  Multiple Vitamin (MULTIVITAMIN WITH MINERALS) TABS tablet, Take 1 tablet by mouth daily. , Disp: , Rfl:  .  oxyCODONE (OXY IR/ROXICODONE) 5 MG immediate release tablet, Take 1 tablet (5 mg total) by mouth every 6 (six) hours as needed for severe pain., Disp: 30 tablet, Rfl: 0 .  senna-docusate (SENOKOT-S) 8.6-50 MG tablet, Take 1 tablet by mouth 2 (two) times daily., Disp: 30 tablet, Rfl: 0 .  amiodarone (PACERONE) 200 MG tablet, Take 1 tablet (200 mg total) by mouth daily., Disp: 90 tablet, Rfl: [place], D.O. [place], [facility] 04/18/2016

## 2016-04-18 NOTE — Addendum Note (Signed)
Addended by: Briscoe Deutscher R on: 04/18/2016 11:33 AM   Modules accepted: Orders

## 2016-04-18 NOTE — Progress Notes (Signed)
Thanks for seeing [name]. He should still be on amiodarone. The med has not been discontinued as suggested by your note.See my note from earlier this month.  Let me know if questions.

## 2016-04-20 ENCOUNTER — Ambulatory Visit: Payer: Medicare Other | Admitting: *Deleted

## 2016-04-20 ENCOUNTER — Ambulatory Visit (INDEPENDENT_AMBULATORY_CARE_PROVIDER_SITE_OTHER): Payer: Medicare Other | Admitting: Family Medicine

## 2016-04-20 ENCOUNTER — Other Ambulatory Visit (HOSPITAL_COMMUNITY)
Admission: AD | Admit: 2016-04-20 | Discharge: 2016-04-20 | Disposition: A | Discharge status: Home / Self Care | Payer: Medicare Other | Source: Other Acute Inpatient Hospital | Attending: Internal Medicine | Admitting: Internal Medicine

## 2016-04-20 ENCOUNTER — Telehealth: Payer: Self-pay | Admitting: *Deleted

## 2016-04-20 ENCOUNTER — Encounter: Payer: Self-pay | Admitting: Family Medicine

## 2016-04-20 VITALS — BP 138/78 | HR 92 | Temp 97.9°F | Resp 16 | Ht 72.0 in | Wt 186.2 lb

## 2016-04-20 DIAGNOSIS — Z79899 Other long term (current) drug therapy: Secondary | ICD-10-CM | POA: Diagnosis not present

## 2016-04-20 DIAGNOSIS — R7881 Bacteremia: Secondary | ICD-10-CM | POA: Diagnosis not present

## 2016-04-20 DIAGNOSIS — G9340 Encephalopathy, unspecified: Secondary | ICD-10-CM | POA: Diagnosis not present

## 2016-04-20 DIAGNOSIS — M4630 Infection of intervertebral disc (pyogenic), site unspecified: Secondary | ICD-10-CM | POA: Diagnosis not present

## 2016-04-20 DIAGNOSIS — I2510 Atherosclerotic heart disease of native coronary artery without angina pectoris: Secondary | ICD-10-CM | POA: Diagnosis not present

## 2016-04-20 DIAGNOSIS — B955 Unspecified streptococcus as the cause of diseases classified elsewhere: Secondary | ICD-10-CM

## 2016-04-20 DIAGNOSIS — M4646 Discitis, unspecified, lumbar region: Secondary | ICD-10-CM | POA: Diagnosis not present

## 2016-04-20 DIAGNOSIS — Z452 Encounter for adjustment and management of vascular access device: Secondary | ICD-10-CM | POA: Diagnosis not present

## 2016-04-20 DIAGNOSIS — Z0000 Encounter for general adult medical examination without abnormal findings: Secondary | ICD-10-CM

## 2016-04-20 DIAGNOSIS — R29898 Other symptoms and signs involving the musculoskeletal system: Secondary | ICD-10-CM

## 2016-04-20 LAB — CBC WITH DIFFERENTIAL/PLATELET
Basophils Absolute: 0.1 10*3/uL (ref 0.0–0.1)
Basophils Absolute: 0 10*3/uL (ref 0.0–0.1)
Basophils Relative: 0.6 % (ref 0.0–3.0)
Basophils Relative: 0 %
EOS ABS: 0.1 10*3/uL (ref 0.0–0.7)
EOS PCT: 1 %
Eosinophils Absolute: 0.1 10*3/uL (ref 0.0–0.7)
Eosinophils Relative: 0.6 % (ref 0.0–5.0)
HCT: 32.3 % — ABNORMAL LOW (ref 39.0–52.0)
HCT: 32.1 % — ABNORMAL LOW (ref 39.0–52.0)
Hemoglobin: 10.6 g/dL — ABNORMAL LOW (ref 13.0–17.0)
Hemoglobin: 10 g/dL — ABNORMAL LOW (ref 13.0–17.0)
LYMPHS ABS: 1.3 10*3/uL (ref 0.7–4.0)
Lymphocytes Relative: 11.8 % — ABNORMAL LOW (ref 12.0–46.0)
Lymphocytes Relative: 11 %
Lymphs Abs: 1.4 10*3/uL (ref 0.7–4.0)
MCH: 29.2 pg (ref 26.0–34.0)
MCHC: 32.8 g/dL (ref 30.0–36.0)
MCHC: 31.2 g/dL (ref 30.0–36.0)
MCV: 89.9 fl (ref 78.0–100.0)
MCV: 93.6 fL (ref 78.0–100.0)
Monocytes Absolute: 0.9 10*3/uL (ref 0.1–1.0)
Monocytes Absolute: 0.8 10*3/uL (ref 0.1–1.0)
Monocytes Relative: 7.3 % (ref 3.0–12.0)
Monocytes Relative: 7 %
Neutro Abs: 9.3 10*3/uL — ABNORMAL HIGH (ref 1.4–7.7)
Neutro Abs: 9.3 10*3/uL — ABNORMAL HIGH (ref 1.7–7.7)
Neutrophils Relative %: 79.7 % — ABNORMAL HIGH (ref 43.0–77.0)
Neutrophils Relative %: 81 %
PLATELETS: 358 10*3/uL (ref 150–400)
Platelets: 399 10*3/uL (ref 150.0–400.0)
RBC: 3.59 Mil/uL — ABNORMAL LOW (ref 4.22–5.81)
RBC: 3.43 MIL/uL — AB (ref 4.22–5.81)
RDW: 15.1 % (ref 11.5–15.5)
RDW: 15.3 % (ref 11.5–15.5)
WBC: 11.7 10*3/uL — ABNORMAL HIGH (ref 4.0–10.5)
WBC: 11.5 10*3/uL — AB (ref 4.0–10.5)

## 2016-04-20 LAB — COMPREHENSIVE METABOLIC PANEL
ALT: 18 U/L (ref 17–63)
ANION GAP: 12 (ref 5–15)
AST: 22 U/L (ref 15–41)
Albumin: 3.3 g/dL — ABNORMAL LOW (ref 3.5–5.0)
Alkaline Phosphatase: 105 U/L (ref 38–126)
BUN: 12 mg/dL (ref 6–20)
CHLORIDE: 100 mmol/L — AB (ref 101–111)
CO2: 25 mmol/L (ref 22–32)
Calcium: 9.2 mg/dL (ref 8.9–10.3)
Creatinine, Ser: 0.84 mg/dL (ref 0.61–1.24)
GFR calc non Af Amer: >60 mL/min (ref >60)
Glucose, Bld: 79 mg/dL (ref 65–99)
Potassium: 4.3 mmol/L (ref 3.5–5.1)
SODIUM: 137 mmol/L (ref 135–145)
Total Bilirubin: 0.4 mg/dL (ref 0.3–1.2)
Total Protein: 6.9 g/dL (ref 6.5–8.1)

## 2016-04-20 LAB — SEDIMENTATION RATE
Sed Rate: 46 mm/hr — ABNORMAL HIGH (ref 0–20)
Sed Rate: 112 mm/hr — ABNORMAL HIGH (ref 0–16)

## 2016-04-20 LAB — C-REACTIVE PROTEIN: CRP: 6.6 mg/dL (ref 0.5–20.0)

## 2016-04-20 NOTE — Patient Instructions (Signed)
Bring a copy of your advance directives to your next office visit.  

## 2016-04-20 NOTE — Progress Notes (Signed)
Jonathan Holden is a 71 y.o. male is here to [place].   History of Present Illness:   Chief Complaint  Patient presents with  . Medicare Wellness  . Follow Up   HPI:  1. Encounter for Medicare annual wellness exam: Reviewed and agree with RN note.   2. Streptococcal bacteremia. Repeat labs today. Followed by ID.     3. 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 Unable to view most recent MRI report.    4. On amiodarone therapy. Still RRR today. Cardiovascular ROS: negative for - chest pain, edema, palpitations or shortness of breath.    5. Muscular deconditioning. Needs new Home Health order.    6. Healthcare maintenance. Offered vaccinations below - patient would like for me to make sure that it is okay with ID first.     Health Maintenance Due  Topic Date Due  . TETANUS/TDAP  07/31/1963  . PNA vac Low Risk Adult (1 of 2 - PCV13) 07/30/2009   PMHx, SurgHx, SocialHx, Medications, and Allergies were reviewed in the Visit Navigator and updated as appropriate.   Past Medical History:  Diagnosis Date  . Acute blood loss anemia   . Acute lower GI bleeding 03/19/2016  . Arthritis   . Atrial fibrillation with RVR ([place]) 02/21/2016  . Coronary artery disease   . Diverticulosis 04/18/2016  . Endocarditis of native valve   . Former smoker   . H/O gastric ulcer   . History of MI (myocardial infarction) 1992  . Hyperlipidemia   . Hypertension   . Infection of intervertebral disc ([place])   . Insomnia 04/18/2016  . Left retinal detachment 03/18/2015  . Loose body of right shoulder   . 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 Pseudophakia of left eye 05/22/2013  . Pseudophakia of right eye 06/05/2013  . S/P coronary angioplasty   . Streptococcal bacteremia      Past Surgical History:  Procedure Laterality Date  . CARDIOVASCULAR STRESS TEST  08/22/2008  . CORONARY ANGIOPLASTY  1992  . ERCP W/ SPHICTEROTOMY  12/15/2002  . LAPAROSCOPIC CHOLECYSTECTOMY   12/16/2002  . LUMBAR LAMINECTOMY     L5  . TEE WITHOUT CARDIOVERSION N/A 02/28/2016   Procedure: TRANSESOPHAGEAL ECHOCARDIOGRAM (TEE);  Surgeon: Fay Records, MD;  Location: [facility] ENDOSCOPY;  Service: Cardiovascular;  Laterality: N/A;     Family History  Problem Relation Age of Onset  . Other Mother     AGE 38 HEALTHY  . Heart attack Father 29    2 MI  . Heart disease Maternal Grandfather   . Heart disease Paternal Grandfather   . Emphysema    . Other Brother     HEALTHY    Social History  Substance Use Topics  . Smoking status: Former Smoker    Packs/day: 2.00    Years: 20.00    Types: Cigarettes    Quit date: 03/27/1990  . Smokeless tobacco: Never Used  . Alcohol use 6.0 oz/week    10 Glasses of wine per week    Current Medications and Allergies:   .  Alirocumab (PRALUENT) 150 MG/ML SOPN, Inject 1 pen into the skin every 14 (fourteen) days., Disp: , Rfl:  .  amiodarone (PACERONE) 200 MG tablet, Take 1 tablet (200 mg total) by mouth daily., Disp: 90 tablet, Rfl: 3 .  atorvastatin (LIPITOR) 40 MG tablet, Take 40 mg by mouth at bedtime., Disp: , Rfl:  .  cefTRIAXone (ROCEPHIN) 10 g injection, Take as directed., Disp: ,  Rfl:  .  diphenhydramine-acetaminophen (TYLENOL PM) 25-500 MG TABS tablet, Take 1 tablet by mouth at bedtime as needed., Disp: , Rfl:  .  Melatonin 3 MG TABS, Take 3 tablets (9 mg total) by mouth at bedtime as needed (sleep)., Disp: 12 tablet, Rfl: 0 .  metoprolol tartrate (LOPRESSOR) 25 MG tablet, Take 1 tablet (25 mg total) by mouth at bedtime., Disp: 90 tablet, Rfl: 3 .  Multiple Vitamin (MULTIVITAMIN WITH MINERALS) TABS tablet, Take 1 tablet by mouth daily. , Disp: , Rfl:  .  oxyCODONE (OXY IR/ROXICODONE) 5 MG immediate release tablet, Take 1 tablet (5 mg total) by mouth every 6 (six) hours as needed for severe pain., Disp: 30 tablet, Rfl: 0 .  senna-docusate (SENOKOT-S) 8.6-50 MG tablet, Take 1 tablet by mouth 2 (two) times daily. (Patient taking differently:  Take 1 tablet by mouth as needed. ), Disp: 30 tablet, Rfl: 0   Allergies  Allergen Reactions  . Eliquis [Apixaban] Other (See Comments)    Recurrent GI bleeding occurred from diverticulosis in the setting of anticoagulation therapy for atrial fibrillation.  . Ciprofloxacin Hives   Review of Systems:   Review of Systems  Constitutional: Positive for malaise/fatigue. Negative for chills, fever and weight loss.  HENT: Negative for congestion, ear pain, hearing loss, nosebleeds, sinus pain and sore throat.   Eyes: Negative for blurred vision, double vision, pain and redness.  Respiratory: Positive for cough. Negative for shortness of breath and wheezing.   Cardiovascular: Negative for chest pain, palpitations and leg swelling.  Gastrointestinal: Positive for abdominal pain and constipation. Negative for blood in stool, diarrhea, heartburn, nausea and vomiting.  Genitourinary: Negative for dysuria, frequency, hematuria and urgency.  Musculoskeletal: Positive for back pain, falls and joint pain. Negative for neck pain.  Skin: Negative for itching and rash.  Neurological: Positive for weakness. Negative for dizziness, tingling, seizures and headaches.  Endo/Heme/Allergies: Bruises/bleeds easily.  Psychiatric/Behavioral: Negative for depression, hallucinations, memory loss, substance abuse and suicidal ideas. The patient is nervous/anxious. The patient does not have insomnia.     Vitals:   Vitals:   04/20/16 1021  BP: 138/78  Pulse: 92  Resp: 16  Temp: 97.9 F (36.6 C)  TempSrc: Oral  SpO2: 98%  Weight: 186 lb 3.2 oz (84.5 kg)  Height: 6' (1.829 m)     Body mass index is 25.25 kg/m.   Physical Exam:   Physical Exam  Constitutional: He is oriented to person, place, and time. He appears well-developed and well-nourished. No distress.  HENT:  Head: Normocephalic and atraumatic.  Right Ear: External ear normal.  Left Ear: External ear normal.  Nose: Nose normal.    Mouth/Throat: Oropharynx is clear and moist.  Eyes: Conjunctivae and EOM are normal. Pupils are equal, round, and reactive to light.  Neck: Normal range of motion. Neck supple.  Cardiovascular: Normal rate, regular rhythm, normal heart sounds and intact distal pulses.   Pulmonary/Chest: Effort normal and breath sounds normal.  Abdominal: Soft. Bowel sounds are normal.  Neurological: He is alert and oriented to person, place, and time. He has normal reflexes.  General deconditioning, using a walker. No focal findings.  Skin: Skin is warm and dry.     Psychiatric: He has a normal mood and affect. His behavior is normal. Judgment and thought content normal.  Nursing note and vitals reviewed.   Results for orders placed or performed in visit on 04/20/16  CBC with Differential/Platelet  Result Value Ref Range   WBC 11.7 (H)  4.0 - 10.5 K/uL   RBC 3.59 (L) 4.22 - 5.81 Mil/uL   Hemoglobin 10.6 (L) 13.0 - 17.0 g/dL   HCT 32.3 (L) 39.0 - 52.0 %   MCV 89.9 78.0 - 100.0 fl   MCHC 32.8 30.0 - 36.0 g/dL   RDW 15.1 11.5 - 15.5 %   Platelets 399.0 150.0 - 400.0 K/uL   Neutrophils Relative % 79.7 (H) 43.0 - 77.0 %   Lymphocytes Relative 11.8 (L) 12.0 - 46.0 %   Monocytes Relative 7.3 3.0 - 12.0 %   Eosinophils Relative 0.6 0.0 - 5.0 %   Basophils Relative 0.6 0.0 - 3.0 %   Neutro Abs 9.3 (H) 1.4 - 7.7 K/uL   Lymphs Abs 1.4 0.7 - 4.0 K/uL   Monocytes Absolute 0.9 0.1 - 1.0 K/uL   Eosinophils Absolute 0.1 0.0 - 0.7 K/uL   Basophils Absolute 0.1 0.0 - 0.1 K/uL  C-reactive protein  Result Value Ref Range   CRP 6.6 0.5 - 20.0 mg/dL  Sedimentation rate  Result Value Ref Range   Sed Rate 46 (H) 0 - 20 mm/hr    Assessment and Plan:    [place] was seen today for medicare wellness and follow-up.  Diagnoses and all orders for this visit:  Encounter for Medicare annual wellness exam  Streptococcal bacteremia -     CBC with Differential/Platelet -     C-reactive protein -      Sedimentation rate  Infection of intervertebral disc (H[facility]) Comments: Trying to locate MRI results. Labs as above.  On amiodarone therapy Comments: Confirmed that the patient was still taking his Amiodorone. Stable.  Muscular deconditioning -     Ambulatory referral to [place] maintenance Comments: Will ask ID if okay to give tdap, PCV 13.     . Reviewed expectations re: course of current medical issues. . Discussed self-management of symptoms. . Outlined signs and symptoms indicating need for more acute intervention. . Patient verbalized understanding and all questions were answered. . See orders for this visit as documented in the electronic medical record. . Patient received an After Visit Summary.  Records requested if needed. I spent 30 minutes with this patient, greater than 50% was face-to-face time counseling regarding the above diagnoses.  CMA served as Education administrator during this visit. History, Physical, and Plan performed by medical provider. Documentation and orders reviewed and attested to. Briscoe Deutscher, D.O.  Briscoe Deutscher, [place], [place] 04/21/2016   Meds ordered this encounter  Medications  . diphenhydramine-acetaminophen (TYLENOL PM) 25-500 MG TABS tablet    Sig: Take 1 tablet by mouth at bedtime as needed.

## 2016-04-20 NOTE — Progress Notes (Signed)
Subjective:   Jonathan Holden is a 71 y.o. male who presents for an Initial Medicare Annual Wellness Visit.  MRI outside films were given to Dr Storm Frisk office. No image has been uploaded yet.   Pt would like to continue PT/OT with [facility]  Review of Systems  No ROS.  Medicare Wellness Visit.  Cardiac Risk Factors include: advanced age (>33men, >70 women);dyslipidemia;hypertension;family history of premature cardiovascular disease;male gender  Sleep patterns: 6 hrs/night. Naps occ.  Home Safety/Smoke Alarms:  Feels safe in home. Smoke alarms in place.  Living environment; residence and Firearm Safety: Uses walker. Has elevated toilet seat.  Seat Belt Safety/Bike Helmet: Wears seat belt.   Counseling:   Eye Exam- Last exam in January at [facility]. Dental- Went last week and has possible infected tooth. Follows up with oral surgeon tomorrow to have tooth pulled and bone culture.   Male:   CCS-   04/10/2014. Normal. 138/78  PSA- No results found for: PSA     Objective:    Today's Vitals   04/20/16 1021  BP: 138/78  Pulse: 92  Resp: 16  Temp: 97.9 F (36.6 C)  TempSrc: Oral  SpO2: 98%  Weight: 186 lb 3.2 oz (84.5 kg)  Height: 6' (1.829 m)   Body mass index is 25.25 kg/m.  Current Medications (verified) Outpatient Encounter Prescriptions as of 04/20/2016  Medication Sig  . Alirocumab (PRALUENT) 150 MG/ML SOPN Inject 1 pen into the skin every 14 (fourteen) days.  Marland Kitchen amiodarone (PACERONE) 200 MG tablet Take 1 tablet (200 mg total) by mouth daily.  Marland Kitchen atorvastatin (LIPITOR) 40 MG tablet Take 40 mg by mouth at bedtime.  . cefTRIAXone (ROCEPHIN) 10 g injection Take as directed.  . diphenhydramine-acetaminophen (TYLENOL PM) 25-500 MG TABS tablet Take 1 tablet by mouth at bedtime as needed.  . Melatonin 3 MG TABS Take 3 tablets (9 mg total) by mouth at bedtime as needed (sleep).  . metoprolol tartrate (LOPRESSOR) 25 MG tablet Take 1 tablet (25 mg total) by mouth at  bedtime.  . Multiple Vitamin (MULTIVITAMIN WITH MINERALS) TABS tablet Take 1 tablet by mouth daily.   Marland Kitchen oxyCODONE (OXY IR/ROXICODONE) 5 MG immediate release tablet Take 1 tablet (5 mg total) by mouth every 6 (six) hours as needed for severe pain.  Marland Kitchen senna-docusate (SENOKOT-S) 8.6-50 MG tablet Take 1 tablet by mouth 2 (two) times daily. (Patient taking differently: Take 1 tablet by mouth as needed. )   No facility-administered encounter medications on file as of 04/20/2016.     Allergies (verified) Eliquis [apixaban]; Food; and Ciprofloxacin   History: Past Medical History:  Diagnosis Date  . Acute blood loss anemia   . Acute lower GI bleeding 03/19/2016  . Arthritis   . Atrial fibrillation with RVR ([place]) 02/21/2016  . Coronary artery disease   . Diverticulosis 04/18/2016  . Endocarditis of native valve   . Former smoker   . H/O gastric ulcer   . History of MI (myocardial infarction) 1992  . Hyperlipidemia   . Hypertension   . Infection of intervertebral disc ([place])   . Insomnia 04/18/2016  . Left retinal detachment 03/18/2015  . Loose body of right shoulder   . 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 Pseudophakia of left eye 05/22/2013  . Pseudophakia of right eye 06/05/2013  . S/P coronary angioplasty   . Streptococcal bacteremia    Past Surgical History:  Procedure Laterality Date  . CARDIOVASCULAR STRESS TEST  08/22/2008  .  CORONARY ANGIOPLASTY  1992  . ERCP W/ SPHICTEROTOMY  12/15/2002  . LAPAROSCOPIC CHOLECYSTECTOMY  12/16/2002  . LUMBAR LAMINECTOMY     L5  . TEE WITHOUT CARDIOVERSION N/A 02/28/2016   Procedure: TRANSESOPHAGEAL ECHOCARDIOGRAM (TEE);  Surgeon: Fay Records, MD;  Location: [facility] ENDOSCOPY;  Service: Cardiovascular;  Laterality: N/A;   Family History  Problem Relation Age of Onset  . Other Mother     AGE 4 HEALTHY  . Heart attack Father 36    2 MI  . Heart disease Maternal Grandfather   . Heart disease Paternal Grandfather   .  Emphysema    . Other Brother     HEALTHY   Social History   Occupational History  . Not on file.   Social History Main Topics  . Smoking status: Former Smoker    Packs/day: 2.00    Years: 20.00    Types: Cigarettes    Quit date: 03/27/1990  . Smokeless tobacco: Never Used  . Alcohol use 6.0 oz/week    10 Glasses of wine per week  . Drug use: No  . Sexual activity: Not Currently   Tobacco Counseling Counseling given: Not Answered   Activities of Daily Living In your present state of health, do you have any difficulty performing the following activities: 04/20/2016 03/17/2016  Hearing? N N  Vision? N N  Difficulty concentrating or making decisions? Y N  Walking or climbing stairs? Y Y  Dressing or bathing? Y Y  Doing errands, shopping? [name]  Preparing Food and eating ? N -  Using the Toilet? N -  In the past six months, have you accidently leaked urine? N -  Do you have problems with loss of bowel control? N -  Managing your Medications? N -  Managing your Finances? N -  Housekeeping or managing your Housekeeping? N -  Some recent data might be hidden    Immunizations and Health Maintenance Immunization History  Administered Date(s) Administered  . Influenza-Unspecified 11/20/2015   Health Maintenance Due  Topic Date Due  . TETANUS/TDAP  07/31/1963  . PNA vac Low Risk Adult (1 of 2 - PCV13) 07/30/2009    Patient Care Team: Briscoe Deutscher, DO as PCP - General (Family Medicine) Provider Not In System (Oral Surgery) Ashok Pall, MD as Consulting Physician (Neurosurgery) Provider Not In System (Dentistry) Carlyle Basques, MD as Consulting Physician (Infectious Diseases) Latanya Maudlin, MD as Consulting Physician (Orthopedic Surgery) Belva Crome, MD as Consulting Physician (Cardiology)  Indicate any recent Medical Services you may have received from other than [facility] providers in the past year (date may be approximate).    Assessment:   This is a routine wellness  examination for Jonathan Holden. Physical assessment deferred to PCP.   Hearing/Vision screen Hearing Screening Comments: Able to hear conversational tones w/o difficulty. No issues reported. Passed whisper test.   Vision Screening Comments: Last exam in January.  Dietary issues and exercise activities discussed: Current Exercise Habits: The patient does not participate in regular exercise at present, Exercise limited by: orthopedic condition(s) Diet (meal preparation, eat out, water intake, caffeinated beverages, dairy products, fruits and vegetables): 5 or 6 small meals/day. Snacks with yogurt, protein shake. States he eats healthy. Eats green leafy vegetables, eggs, chicken. Does not eat red meat. Most food cooked at home.   Goals    . LDL CALC < 70    . Walk independently in the next 30-60 days      Depression Screen PHQ 2/9 Scores  04/20/2016 04/13/2016  PHQ - 2 Score 0 0    Fall Risk Fall Risk  04/20/2016 04/13/2016  Falls in the past year? Yes Yes  Number falls in past yr: 1 1  Injury with Fall? Yes Yes  Risk Factor Category  High Fall Risk High Fall Risk  Risk for fall due to : Impaired balance/gait;Impaired mobility History of fall(s);Impaired balance/gait;Impaired mobility  Follow up Education provided;Falls prevention discussed -    Cognitive Function:   Ad8 score reviewed for issues:  Issues making decisions:no  Less interest in hobbies / activities:no  Repeats questions, stories (family complaining):no  Trouble using ordinary gadgets (microwave, computer, phone):no  Forgets the month or year: no  Mismanaging finances: no  Remembering appts:no  Daily problems with thinking and/or memory:no Ad8 score is=0      Screening Tests Health Maintenance  Topic Date Due  . TETANUS/TDAP  07/31/1963  . PNA vac Low Risk Adult (1 of 2 - PCV13) 07/30/2009  . INFLUENZA VACCINE  08/19/2016  . COLONOSCOPY  04/09/2024  . Hepatitis C Screening  Completed        Plan:    Pt  would like to continue PT/OT. Needs reorder.  Bring a copy of your advance directives to your next office visit.  During the course of the visit Jorah was educated and counseled about the following appropriate screening and preventive services:   Vaccines to include Pneumoccal, Influenza, Hepatitis B, Td, Zostavax, HCV  Colorectal cancer screening  Cardiovascular disease screening  Diabetes screening  Glaucoma screening  Nutrition counseling  Prostate cancer screening  Smoking cessation counseling  Patient Instructions (the written plan) were given to the patient.   Ree Edman, RN   04/20/2016    I have personally reviewed the Medicare Annual Wellness questionnaire and have noted 1. The patient's medical and social history 2. Their use of alcohol, tobacco or illicit drugs 3. Their current medications and supplements 4. The patient's functional ability including ADL's, fall risks, home safety risks and hearing or visual impairment. 5. Diet and physical activities 6. Evidence for depression or mood disorders 7. Reviewed Updated provider list, see scanned forms and CH[facility] Snapshot.   The patients weight, height, BMI and visual acuity have been recorded in the chart I have made referrals, counseling and provided education to the patient based review of the above and I have provided the pt with a written personalized care plan for preventive services.  Briscoe Deutscher, D.O. [place], [facility]

## 2016-04-20 NOTE — Telephone Encounter (Addendum)
Mollie is requesting a call from Dr Baxter Flattery. She has many questions regarding lab results, treatment plan. Please call [phone].

## 2016-04-20 NOTE — Telephone Encounter (Signed)
RN received message that Denver Faster was calling on Chet's behalf.  She called once this morning and again at 2:45, asking for this nurse to call back at my earliest convenience to talk about "what was going on". RN returned the call, left message on her voicemail. Will try to call back this afternoon. Landis Gandy, RN

## 2016-04-20 NOTE — Progress Notes (Signed)
Pre visit review using our clinic review tool, if applicable. No additional management support is needed unless otherwise documented below in the visit note. 

## 2016-04-20 NOTE — Telephone Encounter (Signed)
I spoke with patient.

## 2016-04-21 DIAGNOSIS — R7881 Bacteremia: Secondary | ICD-10-CM | POA: Diagnosis not present

## 2016-04-21 DIAGNOSIS — G9340 Encephalopathy, unspecified: Secondary | ICD-10-CM | POA: Diagnosis not present

## 2016-04-21 DIAGNOSIS — B955 Unspecified streptococcus as the cause of diseases classified elsewhere: Secondary | ICD-10-CM | POA: Diagnosis not present

## 2016-04-21 DIAGNOSIS — M4646 Discitis, unspecified, lumbar region: Secondary | ICD-10-CM | POA: Diagnosis not present

## 2016-04-21 DIAGNOSIS — I2510 Atherosclerotic heart disease of native coronary artery without angina pectoris: Secondary | ICD-10-CM | POA: Diagnosis not present

## 2016-04-21 DIAGNOSIS — Z452 Encounter for adjustment and management of vascular access device: Secondary | ICD-10-CM | POA: Diagnosis not present

## 2016-04-21 LAB — C-REACTIVE PROTEIN: CRP: 6.6 mg/dL — ABNORMAL HIGH (ref <1.0)

## 2016-04-22 ENCOUNTER — Telehealth: Payer: Self-pay | Admitting: Pharmacist

## 2016-04-22 DIAGNOSIS — Z452 Encounter for adjustment and management of vascular access device: Secondary | ICD-10-CM | POA: Diagnosis not present

## 2016-04-22 DIAGNOSIS — I2510 Atherosclerotic heart disease of native coronary artery without angina pectoris: Secondary | ICD-10-CM | POA: Diagnosis not present

## 2016-04-22 DIAGNOSIS — G9340 Encephalopathy, unspecified: Secondary | ICD-10-CM | POA: Diagnosis not present

## 2016-04-22 DIAGNOSIS — B955 Unspecified streptococcus as the cause of diseases classified elsewhere: Secondary | ICD-10-CM | POA: Diagnosis not present

## 2016-04-22 DIAGNOSIS — M4646 Discitis, unspecified, lumbar region: Secondary | ICD-10-CM | POA: Diagnosis not present

## 2016-04-22 DIAGNOSIS — R7881 Bacteremia: Secondary | ICD-10-CM | POA: Diagnosis not present

## 2016-04-22 NOTE — Telephone Encounter (Signed)
Luanne Bras, Pharmacist at [facility], and gave verbal order to stop patient's ceftriaxone and start vancomycin + cefepime per Dr. Baxter Flattery until 4/29. Jeani Hawking verbalized understanding.

## 2016-04-23 ENCOUNTER — Encounter: Payer: Self-pay | Admitting: Internal Medicine

## 2016-04-23 ENCOUNTER — Ambulatory Visit
Admission: RE | Admit: 2016-04-23 | Discharge: 2016-04-23 | Disposition: A | Discharge status: Home / Self Care | Payer: Self-pay | Source: Ambulatory Visit | Attending: Orthopedic Surgery | Admitting: Orthopedic Surgery

## 2016-04-23 ENCOUNTER — Other Ambulatory Visit (HOSPITAL_COMMUNITY): Payer: Self-pay | Admitting: Orthopedic Surgery

## 2016-04-23 ENCOUNTER — Other Ambulatory Visit: Payer: Self-pay | Admitting: Orthopedic Surgery

## 2016-04-23 ENCOUNTER — Ambulatory Visit (INDEPENDENT_AMBULATORY_CARE_PROVIDER_SITE_OTHER): Payer: Medicare Other | Admitting: Internal Medicine

## 2016-04-23 ENCOUNTER — Telehealth: Payer: Self-pay | Admitting: Family Medicine

## 2016-04-23 VITALS — BP 132/79 | HR 97 | Temp 97.4°F | Wt 183.0 lb

## 2016-04-23 DIAGNOSIS — M5440 Lumbago with sciatica, unspecified side: Secondary | ICD-10-CM

## 2016-04-23 DIAGNOSIS — R634 Abnormal weight loss: Secondary | ICD-10-CM | POA: Diagnosis not present

## 2016-04-23 DIAGNOSIS — M4646 Discitis, unspecified, lumbar region: Secondary | ICD-10-CM

## 2016-04-23 DIAGNOSIS — A491 Streptococcal infection, unspecified site: Secondary | ICD-10-CM

## 2016-04-23 DIAGNOSIS — R52 Pain, unspecified: Secondary | ICD-10-CM

## 2016-04-23 DIAGNOSIS — B955 Unspecified streptococcus as the cause of diseases classified elsewhere: Secondary | ICD-10-CM | POA: Diagnosis not present

## 2016-04-23 DIAGNOSIS — I2510 Atherosclerotic heart disease of native coronary artery without angina pectoris: Secondary | ICD-10-CM

## 2016-04-23 DIAGNOSIS — G9340 Encephalopathy, unspecified: Secondary | ICD-10-CM | POA: Diagnosis not present

## 2016-04-23 DIAGNOSIS — Z452 Encounter for adjustment and management of vascular access device: Secondary | ICD-10-CM | POA: Diagnosis not present

## 2016-04-23 DIAGNOSIS — R7881 Bacteremia: Secondary | ICD-10-CM | POA: Diagnosis not present

## 2016-04-23 MED ORDER — TRAMADOL HCL 50 MG PO TABS: 50.0000 mg | ORAL_TABLET | Freq: Four times a day (QID) | ORAL | 1 refills | Status: DC | PRN

## 2016-04-23 MED ORDER — PREGABALIN 75 MG PO CAPS: 75.0000 mg | ORAL_CAPSULE | Freq: Every day | ORAL | 1 refills | Status: DC

## 2016-04-23 NOTE — Telephone Encounter (Signed)
Patient called to advise that at his last appointment Dr. Juleen China offered to refer him to [facility]. Patient would like to further discuss this option. Also, patient stated he saw Dr. Graylon Good today and she thought that he may need a second opinion on the referral. Dr. Graylon Good also changed his antibiotic and the patient has questions about this change. Please call patient in the morning on 04-24-16 per patient. Patient "would like to speak directly to Dr. Juleen China and not a CMA if possible due to the complexity of his questions." (per patient). Okay to leave a detailed message on patient phone.

## 2016-04-23 NOTE — Telephone Encounter (Signed)
Done

## 2016-04-23 NOTE — Telephone Encounter (Signed)
Change to vancomycin plus ertapenem

## 2016-04-23 NOTE — Progress Notes (Signed)
RFV: follow up for dicitis  Patient ID: Jonathan Holden, male   DOB: [birth year], 71 y.o.   MRN: [id number]  HPI 71yo M with streptococcal bacteremia with L4-L5, L5-S1 lumbar discitis. HE was discharged on 6 wk of ceftriaxone using 3/18 as end date. Since his inflammatory markers had not considerably improved ( 110 on 2/16 to 94 on 3/14). His abtx were extended and repeat imaging of spine was done(coordinated through orthopedics- Dr Jonetta Osgood.). the patient had seen neurosurgery, Dr Cyndy Freeze who felt that the patient did not need any surgical debridement but imaging suggested ongoing infection,. He denies any fever or chills. He still has considerable back pain but does not want to take any opiates since that impaired his cognition, currently taking ibuprofen and tylenol which is not helping. He feels his pain mostly radiates down right thigh  In [facility], they are still elevated in 90s, though last week for unclear reasons, he had labs drawn twice in 1 day, with one sed rate improved at 46 and the 2nd 112.  I have reviewed his [place] medical records  Outpatient Encounter Prescriptions as of 04/23/2016  Medication Sig  . Alirocumab (PRALUENT) 150 MG/ML SOPN Inject 1 pen into the skin every 14 (fourteen) days.  Marland Kitchen amiodarone (PACERONE) 200 MG tablet Take 1 tablet (200 mg total) by mouth daily.  Marland Kitchen atorvastatin (LIPITOR) 40 MG tablet Take 40 mg by mouth at bedtime.  . cefTRIAXone (ROCEPHIN) 10 g injection Take as directed.  . diphenhydramine-acetaminophen (TYLENOL PM) 25-500 MG TABS tablet Take 1 tablet by mouth at bedtime as needed.  . Melatonin 3 MG TABS Take 3 tablets (9 mg total) by mouth at bedtime as needed (sleep).  . metoprolol tartrate (LOPRESSOR) 25 MG tablet Take 1 tablet (25 mg total) by mouth at bedtime.  . Multiple Vitamin (MULTIVITAMIN WITH MINERALS) TABS tablet Take 1 tablet by mouth daily.   Marland Kitchen oxyCODONE (OXY IR/ROXICODONE) 5 MG  immediate release tablet Take 1 tablet (5 mg total) by mouth every 6 (six) hours as needed for severe pain.  Marland Kitchen senna-docusate (SENOKOT-S) 8.6-50 MG tablet Take 1 tablet by mouth 2 (two) times daily. (Patient taking differently: Take 1 tablet by mouth as needed. )   No facility-administered encounter medications on file as of 04/23/2016.      Patient Active Problem List   Diagnosis Date Noted  . Diverticulosis 04/18/2016  . Insomnia 04/18/2016  . Arthralgia 04/18/2016  . Muscular deconditioning 04/18/2016  . CHF (congestive heart failure) ([place]) 04/18/2016  . H/O gastric ulcer   . Former smoker   . On amiodarone therapy 04/09/2016  . Contraindication to anticoagulation therapy 03/25/2016  . Gastroesophageal reflux disease   . Acute blood loss anemia   . Endocarditis of native valve   . Infection of intervertebral disc ([place])   . Streptococcal bacteremia   . Paroxysmal atrial fibrillation with RVR ([place]) 02/21/2016  . 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 Left retinal detachment 03/18/2015  . CAD (coronary artery disease) 09/19/2014  . HTN (hypertension) 09/19/2014  . Hyperlipidemia, unspecified 10/03/2013  . Pseudophakia of right eye 06/05/2013  . Pseudophakia of left eye 05/22/2013     Health Maintenance Due  Topic Date Due  . TETANUS/TDAP  07/31/1963  . PNA vac Low Risk Adult (1 of 2 - PCV13) 07/30/2009     Review of Systems + back pain, weight loss, loss of appetite. Deconditioned. 12 point ros is otherwise negative Physical  Exam   BP 132/79   Pulse 97   Temp 97.4 F (36.3 C) (Oral)   Wt 183 lb (83 kg)   BMI 24.82 kg/m   Physical Exam  Constitutional: He is oriented to person, place, and time. He appears well-developed and well-nourished. No distress.  HENT:  Mouth/Throat: Oropharynx is clear and moist. No oropharyngeal exudate.  Cardiovascular: Normal rate, regular rhythm and normal heart sounds. Exam reveals no gallop and no friction rub.  No  murmur heard.  Pulmonary/Chest: Effort normal and breath sounds normal. No respiratory distress. He has no wheezes.  Abdominal: Soft. Bowel sounds are normal. He exhibits no distension. There is no tenderness.  Lymphadenopathy:  He has no cervical adenopathy.  Neurological: He is alert and oriented to person, place, and time.  Skin: Skin is warm and dry. No rash noted. No erythema.  Psychiatric: He has a normal mood and affect. His behavior is normal.    CBC Lab Results  Component Value Date   WBC 11.5 (H) 04/20/2016   RBC 3.43 (L) 04/20/2016   HGB 10.0 (L) 04/20/2016   HCT 32.1 (L) 04/20/2016   PLT 358 04/20/2016   MCV 93.6 04/20/2016   MCH 29.2 04/20/2016   MCHC 31.2 04/20/2016   RDW 15.3 04/20/2016   LYMPHSABS 1.3 04/20/2016   MONOABS 0.8 04/20/2016   EOSABS 0.1 04/20/2016    BMET Lab Results  Component Value Date   NA 137 04/20/2016   K 4.3 04/20/2016   CL 100 (L) 04/20/2016   CO2 25 04/20/2016   GLUCOSE 79 04/20/2016   BUN 12 04/20/2016   CREATININE 0.84 04/20/2016   CALCIUM 9.2 04/20/2016   GFRNONAA >60 04/20/2016   GFRAA >60 04/20/2016      Assessment and Plan  Lumbar discitis = plan to review his repeat mri imaging with radiology to see if he can undergo biopsy to send for bone culture. Alternatively, if unable to get done soon, would empirically switch to vancomycin and ertapenem (for once a day dosing due to his household logistics, his girlfriend only able to do once aday)  - recommend to get 2nd opinion for neurosurgery  Elevated inflammatory markers = though they can be elevated for other reasons, it is disconcerting that they have not trended down sufficiently after 8 wk of IV abtx. It did appear to be decreased last week down to 40s but unclear what to make of 2nd test that was in the 100s. For now, will repeat lab work early next week as planned  Weight loss = he is now taking 2 ensure drinks in adddition to meals, recommend to increase to 3 cans.  He was 180 at our first visit on 3/14 and now at 183.  Back pain management = I have recommended that he does a trial of tramadol with one dose of lyrica to see if that helps his pain. He did appear cognitively impaired the first time I saw him, and he appears much more like himself, per his sister in [place] report since being off of the opiates. I also recommended he consider seeing a pain specialist, which can be accessed through his neurosurgeon's office.  Boundaries discussion = I spoke to mr. Landen specifically regarding his health advocate/girflriend, Mollie, who has been at times very aggressive on the phone with staff members. I reiterated that this is not condoned at the clinic. He states that he does not have problems with our staff, but had poor care while he was hospitalized and  thus her actions are warranted to prevent further error. Again, I mentioned that her behavior is threatening to our staff, even over the phone, this is not appropriate, nor the culture of our workplace. I mentioned that I will call Mollie to speak with her regarding this issue.

## 2016-04-24 DIAGNOSIS — G9340 Encephalopathy, unspecified: Secondary | ICD-10-CM | POA: Diagnosis not present

## 2016-04-24 DIAGNOSIS — Z452 Encounter for adjustment and management of vascular access device: Secondary | ICD-10-CM | POA: Diagnosis not present

## 2016-04-24 DIAGNOSIS — M4646 Discitis, unspecified, lumbar region: Secondary | ICD-10-CM | POA: Diagnosis not present

## 2016-04-24 DIAGNOSIS — R7881 Bacteremia: Secondary | ICD-10-CM | POA: Diagnosis not present

## 2016-04-24 DIAGNOSIS — I2510 Atherosclerotic heart disease of native coronary artery without angina pectoris: Secondary | ICD-10-CM | POA: Diagnosis not present

## 2016-04-24 DIAGNOSIS — B955 Unspecified streptococcus as the cause of diseases classified elsewhere: Secondary | ICD-10-CM | POA: Diagnosis not present

## 2016-04-24 NOTE — Telephone Encounter (Signed)
I spoke with patient and would like the referral to [facility]. He stated that Dr. Baxter Flattery is wanting to do a referral to a Neurosurgeon. She started him on Lyrica and Tramadol yesterday. He stated that the medication is helping. Dr. Baxter Flattery also started him on Vancomycin 1750 mg through IV and Invanz 1 gram through IV. If you get a chance please give him a call about antibiotics.

## 2016-04-24 NOTE — Telephone Encounter (Signed)
Left message for patient to return call. Spoke with [facility] the care giver and she stated that they only have someone coming in at 6:30 in the evening to giver the medication through the IV. She stated that Dr. Graylon Good has switched the antibiotic to morning and evening. Concern is that she has no one to give the morning antibiotic while she is out of town.

## 2016-04-27 ENCOUNTER — Inpatient Hospital Stay (HOSPITAL_COMMUNITY): Admit: 2016-04-27 | Payer: Self-pay

## 2016-04-27 ENCOUNTER — Other Ambulatory Visit (HOSPITAL_COMMUNITY)
Admission: AD | Admit: 2016-04-27 | Discharge: 2016-04-27 | Disposition: A | Discharge status: Home / Self Care | Payer: Medicare Other | Source: Other Acute Inpatient Hospital | Attending: Internal Medicine | Admitting: Internal Medicine

## 2016-04-27 DIAGNOSIS — Z792 Long term (current) use of antibiotics: Secondary | ICD-10-CM | POA: Insufficient documentation

## 2016-04-27 DIAGNOSIS — B955 Unspecified streptococcus as the cause of diseases classified elsewhere: Secondary | ICD-10-CM | POA: Diagnosis not present

## 2016-04-27 DIAGNOSIS — Z452 Encounter for adjustment and management of vascular access device: Secondary | ICD-10-CM | POA: Diagnosis not present

## 2016-04-27 DIAGNOSIS — M4646 Discitis, unspecified, lumbar region: Secondary | ICD-10-CM | POA: Diagnosis not present

## 2016-04-27 DIAGNOSIS — Z5181 Encounter for therapeutic drug level monitoring: Secondary | ICD-10-CM | POA: Diagnosis not present

## 2016-04-27 DIAGNOSIS — I2510 Atherosclerotic heart disease of native coronary artery without angina pectoris: Secondary | ICD-10-CM | POA: Diagnosis not present

## 2016-04-27 DIAGNOSIS — G9340 Encephalopathy, unspecified: Secondary | ICD-10-CM | POA: Diagnosis not present

## 2016-04-27 DIAGNOSIS — R7881 Bacteremia: Secondary | ICD-10-CM | POA: Diagnosis not present

## 2016-04-27 LAB — CBC WITH DIFFERENTIAL/PLATELET
BASOS PCT: 0 %
Basophils Absolute: 0 10*3/uL (ref 0.0–0.1)
EOS ABS: 0.2 10*3/uL (ref 0.0–0.7)
EOS PCT: 2 %
HCT: 33.2 % — ABNORMAL LOW (ref 39.0–52.0)
Hemoglobin: 10.6 g/dL — ABNORMAL LOW (ref 13.0–17.0)
Lymphocytes Relative: 19 %
Lymphs Abs: 1.8 10*3/uL (ref 0.7–4.0)
MCH: 29.3 pg (ref 26.0–34.0)
MCHC: 31.9 g/dL (ref 30.0–36.0)
MCV: 91.7 fL (ref 78.0–100.0)
MONOS PCT: 7 %
Monocytes Absolute: 0.7 10*3/uL (ref 0.1–1.0)
Neutro Abs: 7 10*3/uL (ref 1.7–7.7)
Neutrophils Relative %: 72 %
PLATELETS: 367 10*3/uL (ref 150–400)
RBC: 3.62 MIL/uL — AB (ref 4.22–5.81)
RDW: 14.9 % (ref 11.5–15.5)
WBC: 9.6 10*3/uL (ref 4.0–10.5)

## 2016-04-27 LAB — BASIC METABOLIC PANEL
Anion gap: 11 (ref 5–15)
BUN: 18 mg/dL (ref 6–20)
CALCIUM: 9.6 mg/dL (ref 8.9–10.3)
CHLORIDE: 98 mmol/L — AB (ref 101–111)
CO2: 26 mmol/L (ref 22–32)
CREATININE: 0.9 mg/dL (ref 0.61–1.24)
GFR calc Af Amer: >60 mL/min (ref >60)
GFR calc non Af Amer: >60 mL/min (ref >60)
Glucose, Bld: 103 mg/dL — ABNORMAL HIGH (ref 65–99)
Potassium: 4.5 mmol/L (ref 3.5–5.1)
SODIUM: 135 mmol/L (ref 135–145)

## 2016-04-27 LAB — SEDIMENTATION RATE: SED RATE: 124 mm/h — AB (ref 0–16)

## 2016-04-27 LAB — VANCOMYCIN, TROUGH: Vancomycin Tr: 7 ug/mL — ABNORMAL LOW (ref 15–20)

## 2016-04-28 ENCOUNTER — Other Ambulatory Visit: Payer: Self-pay | Admitting: Pharmacist

## 2016-04-28 ENCOUNTER — Telehealth: Payer: Self-pay | Admitting: Surgical

## 2016-04-28 ENCOUNTER — Other Ambulatory Visit: Payer: Self-pay | Admitting: Internal Medicine

## 2016-04-28 DIAGNOSIS — I2510 Atherosclerotic heart disease of native coronary artery without angina pectoris: Secondary | ICD-10-CM | POA: Diagnosis not present

## 2016-04-28 DIAGNOSIS — M4646 Discitis, unspecified, lumbar region: Secondary | ICD-10-CM

## 2016-04-28 DIAGNOSIS — G9340 Encephalopathy, unspecified: Secondary | ICD-10-CM | POA: Diagnosis not present

## 2016-04-28 DIAGNOSIS — B955 Unspecified streptococcus as the cause of diseases classified elsewhere: Secondary | ICD-10-CM | POA: Diagnosis not present

## 2016-04-28 DIAGNOSIS — R7881 Bacteremia: Secondary | ICD-10-CM | POA: Diagnosis not present

## 2016-04-28 DIAGNOSIS — Z452 Encounter for adjustment and management of vascular access device: Secondary | ICD-10-CM | POA: Diagnosis not present

## 2016-04-28 LAB — C-REACTIVE PROTEIN: CRP: 5.9 mg/dL — ABNORMAL HIGH (ref <1.0)

## 2016-04-28 NOTE — Telephone Encounter (Signed)
Mr Sanguinetti called to ask about referral to [facility] ID doctor. I advised that they will call him to schedule appointment. He then was asking about care for [facility] once she comes home from her procedure. I advised that he would need to call the surgeons office to determine the kind of care that she would need. He verbalized understanding. I gave him the name of the office to where the surgery will be.

## 2016-04-28 NOTE — Addendum Note (Signed)
Addended by: Briscoe Deutscher R on: 04/28/2016 08:37 PM   Modules accepted: Orders

## 2016-04-29 DIAGNOSIS — G9340 Encephalopathy, unspecified: Secondary | ICD-10-CM | POA: Diagnosis not present

## 2016-04-29 DIAGNOSIS — M4646 Discitis, unspecified, lumbar region: Secondary | ICD-10-CM | POA: Diagnosis not present

## 2016-04-29 DIAGNOSIS — B955 Unspecified streptococcus as the cause of diseases classified elsewhere: Secondary | ICD-10-CM | POA: Diagnosis not present

## 2016-04-29 DIAGNOSIS — Z452 Encounter for adjustment and management of vascular access device: Secondary | ICD-10-CM | POA: Diagnosis not present

## 2016-04-29 DIAGNOSIS — R7881 Bacteremia: Secondary | ICD-10-CM | POA: Diagnosis not present

## 2016-04-29 DIAGNOSIS — I2510 Atherosclerotic heart disease of native coronary artery without angina pectoris: Secondary | ICD-10-CM | POA: Diagnosis not present

## 2016-04-30 ENCOUNTER — Other Ambulatory Visit (HOSPITAL_COMMUNITY)
Admission: RE | Admit: 2016-04-30 | Discharge: 2016-04-30 | Disposition: A | Discharge status: Home / Self Care | Payer: Medicare Other | Source: Other Acute Inpatient Hospital | Attending: Internal Medicine | Admitting: Internal Medicine

## 2016-04-30 ENCOUNTER — Ambulatory Visit: Payer: Medicare Other | Admitting: Internal Medicine

## 2016-04-30 ENCOUNTER — Telehealth: Payer: Self-pay | Admitting: Pharmacist

## 2016-04-30 ENCOUNTER — Telehealth: Payer: Self-pay | Admitting: Family Medicine

## 2016-04-30 ENCOUNTER — Telehealth: Payer: Self-pay | Admitting: Internal Medicine

## 2016-04-30 DIAGNOSIS — G9340 Encephalopathy, unspecified: Secondary | ICD-10-CM | POA: Diagnosis not present

## 2016-04-30 DIAGNOSIS — R7881 Bacteremia: Secondary | ICD-10-CM | POA: Insufficient documentation

## 2016-04-30 DIAGNOSIS — M4646 Discitis, unspecified, lumbar region: Secondary | ICD-10-CM | POA: Diagnosis not present

## 2016-04-30 DIAGNOSIS — B955 Unspecified streptococcus as the cause of diseases classified elsewhere: Secondary | ICD-10-CM | POA: Diagnosis not present

## 2016-04-30 DIAGNOSIS — Z452 Encounter for adjustment and management of vascular access device: Secondary | ICD-10-CM | POA: Diagnosis not present

## 2016-04-30 DIAGNOSIS — I2510 Atherosclerotic heart disease of native coronary artery without angina pectoris: Secondary | ICD-10-CM | POA: Diagnosis not present

## 2016-04-30 LAB — BASIC METABOLIC PANEL
ANION GAP: 9 (ref 5–15)
BUN: 15 mg/dL (ref 6–20)
CALCIUM: 9.5 mg/dL (ref 8.9–10.3)
CO2: 27 mmol/L (ref 22–32)
Chloride: 101 mmol/L (ref 101–111)
Creatinine, Ser: 0.76 mg/dL (ref 0.61–1.24)
GLUCOSE: 91 mg/dL (ref 65–99)
POTASSIUM: 4.4 mmol/L (ref 3.5–5.1)
Sodium: 137 mmol/L (ref 135–145)

## 2016-04-30 LAB — CBC WITH DIFFERENTIAL/PLATELET
BASOS ABS: 0 10*3/uL (ref 0.0–0.1)
BASOS PCT: 1 %
Eosinophils Absolute: 0.2 10*3/uL (ref 0.0–0.7)
Eosinophils Relative: 3 %
HEMATOCRIT: 33.8 % — AB (ref 39.0–52.0)
Hemoglobin: 10.8 g/dL — ABNORMAL LOW (ref 13.0–17.0)
LYMPHS PCT: 21 %
Lymphs Abs: 1.8 10*3/uL (ref 0.7–4.0)
MCH: 29.6 pg (ref 26.0–34.0)
MCHC: 32 g/dL (ref 30.0–36.0)
MCV: 92.6 fL (ref 78.0–100.0)
MONO ABS: 0.7 10*3/uL (ref 0.1–1.0)
Monocytes Relative: 8 %
NEUTROS ABS: 5.6 10*3/uL (ref 1.7–7.7)
NEUTROS PCT: 67 %
PLATELETS: 345 10*3/uL (ref 150–400)
RBC: 3.65 MIL/uL — ABNORMAL LOW (ref 4.22–5.81)
RDW: 15.4 % (ref 11.5–15.5)
WBC: 8.3 10*3/uL (ref 4.0–10.5)

## 2016-04-30 LAB — SEDIMENTATION RATE: Sed Rate: 92 mm/hr — ABNORMAL HIGH (ref 0–16)

## 2016-04-30 LAB — VANCOMYCIN, TROUGH: VANCOMYCIN TR: 8 ug/mL — AB (ref 15–20)

## 2016-04-30 NOTE — Telephone Encounter (Signed)
Agree with plan to keep vanco 1750mg  daily. I will reach to patient to reinforce that they need to take meds accordingly so that we can monitor levels accordingly. I would not increase vancomycin level beyond this current dose due to risk of nephrotoxicity

## 2016-04-30 NOTE — Telephone Encounter (Signed)
Patient would like a call back. He declined to specify his concern.  Thank you,  -LL

## 2016-04-30 NOTE — Telephone Encounter (Signed)
Patient on vanc and ertapenem.  Jeani Hawking, [facility] PharmD, called to tell me his vanc trough has been low (7 on 4/9).  Jeani Hawking does not think home provider/caregiver is consistently giving him the daily dose of vanc 1750 mg q24h. Jeani Hawking does not want to increase dose for fear of accumulation if caregiver actually starts giving every day.  Will repeat labs today or tomorrow. Dr. Baxter Flattery notified and aware.

## 2016-04-30 NOTE — Telephone Encounter (Signed)
I spoke to mr Jonathan Holden to confirm that he knows of his mri appointment this Saturday  Also I have reiterated that he needs to take his vancomycin the same time of day so that we can accurately assess if he is at the correct dosage  I acknowledged his concern that his inflammatory markers are not getting better, and told him that is why we are getting the MRI to see if there is disease progression from the discitis/lumbar osteo

## 2016-04-30 NOTE — Telephone Encounter (Signed)
Patient is calling to let you know that lab results are in the system and they have gotten worse.

## 2016-04-30 NOTE — Telephone Encounter (Signed)
Due to inflammatory markers not improving, I wanted to get repeat mri to see what changes might be happening in the lumbar area at prior infection site. He had seen Dr Christella Noa who felt no surgical intervention needed in late Marpch. He is scheduled for repeat lumbar mri w and wo con on Saturday April 14th at 2:45 pm at [facility].  I have left this message on his voicemail.  This imaging will also be useful for his 2nd opinion on [facility] for ID and NSGY

## 2016-05-01 ENCOUNTER — Telehealth: Payer: Self-pay | Admitting: *Deleted

## 2016-05-01 ENCOUNTER — Telehealth: Payer: Self-pay

## 2016-05-01 ENCOUNTER — Telehealth: Payer: Self-pay | Admitting: Family Medicine

## 2016-05-01 DIAGNOSIS — I2510 Atherosclerotic heart disease of native coronary artery without angina pectoris: Secondary | ICD-10-CM | POA: Diagnosis not present

## 2016-05-01 DIAGNOSIS — R7881 Bacteremia: Secondary | ICD-10-CM | POA: Diagnosis not present

## 2016-05-01 DIAGNOSIS — M4646 Discitis, unspecified, lumbar region: Secondary | ICD-10-CM | POA: Diagnosis not present

## 2016-05-01 DIAGNOSIS — B955 Unspecified streptococcus as the cause of diseases classified elsewhere: Secondary | ICD-10-CM | POA: Diagnosis not present

## 2016-05-01 DIAGNOSIS — G9340 Encephalopathy, unspecified: Secondary | ICD-10-CM | POA: Diagnosis not present

## 2016-05-01 DIAGNOSIS — Z452 Encounter for adjustment and management of vascular access device: Secondary | ICD-10-CM | POA: Diagnosis not present

## 2016-05-01 LAB — C-REACTIVE PROTEIN: CRP: 1.2 mg/dL — ABNORMAL HIGH (ref <1.0)

## 2016-05-01 NOTE — Telephone Encounter (Signed)
Benjamine Mola, physical therapist from [place] called to request a verbal order for 2 additional physical therapy visits for home health and 2 occupational therapy visits. Please call and advise.

## 2016-05-01 NOTE — Addendum Note (Signed)
Addended by: Briscoe Deutscher R on: 05/01/2016 09:15 AM   Modules accepted: Orders

## 2016-05-01 NOTE — Telephone Encounter (Signed)
Spoke with patient.  He states he is having an MRI tomorrow and wanted to know how to get the images to Dr. Juleen China and to his ID doctor.  I explained to him (again) that because he is having the MRI at a [facility] facility, the images and results of his MRI will be available in his chart for Dr. Juleen China to view.  I advised he would need to get a copy of the MRI CD to take to any doctor outside the [facility] system.   He asked me how to get a CD.  I advised he would get that from the hospital.  He then stated that he was told that his referrals to [facility] were "being expedited" and wanted to know the status of them.  I told him I was not aware and that I would have to speak with our referral coordinator but she has already left the office for the day.  He asked me to call him Monday after speaking with [facility], but told me to call him early Monday because he's "busy Monday afternoon".  I advised that I would do my best and ended the call.

## 2016-05-01 NOTE — Telephone Encounter (Signed)
Called and left detailed message on Elizabeth's phone that I was giving a verbal order for the patient to have 2 PT and OT therapy. I gave our number for her to return call if she has any questions.

## 2016-05-01 NOTE — Telephone Encounter (Signed)
Both [facility] referrals are in place. Roselyn Reef reiterated that Dr. Baxter Flattery is coordinating his ID plan of care and that we here to help with coordination.

## 2016-05-01 NOTE — Telephone Encounter (Signed)
Received message that patient needed to know where to go to retreive his MRI CD.  CD was delivered by this RN to radiology on 3/28 for scan per patient's request with Mollie's card attached for CD pick up.  RN returned call to the patient with radiology's phone number - [phone], asked him to call that department for further information/instruction.  Patient verbalized understanding. Landis Gandy, RN

## 2016-05-02 ENCOUNTER — Telehealth: Payer: Self-pay | Admitting: Infectious Disease

## 2016-05-02 ENCOUNTER — Ambulatory Visit (HOSPITAL_COMMUNITY)
Admission: RE | Admit: 2016-05-02 | Discharge: 2016-05-02 | Disposition: A | Discharge status: Home / Self Care | Payer: Medicare Other | Source: Ambulatory Visit | Attending: Internal Medicine | Admitting: Internal Medicine

## 2016-05-02 DIAGNOSIS — M4046 Postural lordosis, lumbar region: Secondary | ICD-10-CM | POA: Diagnosis not present

## 2016-05-02 DIAGNOSIS — K5730 Diverticulosis of large intestine without perforation or abscess without bleeding: Secondary | ICD-10-CM | POA: Insufficient documentation

## 2016-05-02 DIAGNOSIS — M4316 Spondylolisthesis, lumbar region: Secondary | ICD-10-CM | POA: Diagnosis not present

## 2016-05-02 DIAGNOSIS — M2578 Osteophyte, vertebrae: Secondary | ICD-10-CM | POA: Diagnosis not present

## 2016-05-02 DIAGNOSIS — M48061 Spinal stenosis, lumbar region without neurogenic claudication: Secondary | ICD-10-CM | POA: Diagnosis not present

## 2016-05-02 DIAGNOSIS — M4646 Discitis, unspecified, lumbar region: Secondary | ICD-10-CM

## 2016-05-02 DIAGNOSIS — M4626 Osteomyelitis of vertebra, lumbar region: Secondary | ICD-10-CM | POA: Insufficient documentation

## 2016-05-02 DIAGNOSIS — Z9889 Other specified postprocedural states: Secondary | ICD-10-CM | POA: Insufficient documentation

## 2016-05-02 DIAGNOSIS — G061 Intraspinal abscess and granuloma: Secondary | ICD-10-CM | POA: Diagnosis not present

## 2016-05-02 MED ORDER — GADOBENATE DIMEGLUMINE 529 MG/ML IV SOLN
20.0000 mL | Freq: Once | INTRAVENOUS | Status: AC | PRN
  Administered 2016-05-02: 20 mL via INTRAVENOUS

## 2016-05-02 NOTE — Telephone Encounter (Signed)
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 The liquid component of it has nearly completely dissappeared. The overall size is the same as before  There is subtle finding at L2  w edema at the endplate not seen before but Dr. Nevada Crane was hesitant to over-emphasize imporance of this finding. Overall the MRI is improved compared to 3 weeks ago  I also discussed the results with the patient and with his girlfriend (he put me on speaker phone)  We discussed the antibiotic regimen. He has ONLY been on the new regimen of vanco and invanz for one week and the rest of the time on Ceftriaxone. I seriously doubt that one week of broader antibiotics would have improved the MRI findings and we have no data in between radiographically. The vanco carries risk of nephrotoxicity not limited to potentially having the patient end up on HD  The invanz is unnecessarily broad given the strep mitis  Patient and girlfriend agreed to return to Ceftriaxone which they have in freezer  I will call [facility]  They will see Dr Christella Noa on Tuesday.

## 2016-05-02 NOTE — Telephone Encounter (Signed)
Received call via hospital operator from patients advocate who was EXTREMELY confrontational.   She wanted me to tell her where to take the patient for his MRI on the 14th.   She then launched into an extensive number of complaints including alleging that the patient acquired his blood stream infection at [facility] (which is highly dubious allegation since he grew a strep intermedius which is typically an oral bacteria or alimentary one and not one that would be typically introduced via skin though I did not bring up anything)  She continued to rail about him not being on antibiotics that were working (note Dr. Baxter Flattery broadened to Vancomycin and Colbert Ewing already to accomodate this fear that the patient was on the wrong antibiotics)  She continued to be very confrontational and I had to remind her to let me get off the phone to call radiology so they could call her back and let her know where to take the patient since it was nearly 5pm  I have now noted issues of the advocate being confrontation and crossing boundaries with my staff per Dr. Storm Frisk note.   (she went on to complain about Dr. Baxter Flattery being at a conference among other comments that I found aggressive, offensive and inappropriate)

## 2016-05-03 NOTE — Telephone Encounter (Signed)
Thank you for the update and getting them back to ceftriaxone. I will let cassie know so that she can let lynn at [facility] know of the change. Again, thank you.

## 2016-05-04 ENCOUNTER — Telehealth: Payer: Self-pay | Admitting: Pharmacist

## 2016-05-04 ENCOUNTER — Other Ambulatory Visit (HOSPITAL_COMMUNITY)
Admission: AD | Admit: 2016-05-04 | Discharge: 2016-05-04 | Disposition: A | Discharge status: Home / Self Care | Payer: Medicare Other | Source: Skilled Nursing Facility | Attending: Infectious Disease | Admitting: Infectious Disease

## 2016-05-04 DIAGNOSIS — I2510 Atherosclerotic heart disease of native coronary artery without angina pectoris: Secondary | ICD-10-CM | POA: Diagnosis not present

## 2016-05-04 DIAGNOSIS — R7881 Bacteremia: Secondary | ICD-10-CM | POA: Insufficient documentation

## 2016-05-04 DIAGNOSIS — B955 Unspecified streptococcus as the cause of diseases classified elsewhere: Secondary | ICD-10-CM | POA: Diagnosis not present

## 2016-05-04 DIAGNOSIS — Z452 Encounter for adjustment and management of vascular access device: Secondary | ICD-10-CM | POA: Diagnosis not present

## 2016-05-04 DIAGNOSIS — M4646 Discitis, unspecified, lumbar region: Secondary | ICD-10-CM | POA: Diagnosis not present

## 2016-05-04 DIAGNOSIS — G9340 Encephalopathy, unspecified: Secondary | ICD-10-CM | POA: Diagnosis not present

## 2016-05-04 LAB — CBC WITH DIFFERENTIAL/PLATELET
Basophils Absolute: 0 10*3/uL (ref 0.0–0.1)
Basophils Relative: 0 %
Eosinophils Absolute: 0.2 10*3/uL (ref 0.0–0.7)
Eosinophils Relative: 2 %
HCT: 33.4 % — ABNORMAL LOW (ref 39.0–52.0)
HEMOGLOBIN: 10.8 g/dL — AB (ref 13.0–17.0)
LYMPHS ABS: 1.6 10*3/uL (ref 0.7–4.0)
LYMPHS PCT: 17 %
MCH: 29.8 pg (ref 26.0–34.0)
MCHC: 32.3 g/dL (ref 30.0–36.0)
MCV: 92.3 fL (ref 78.0–100.0)
MONOS PCT: 10 %
Monocytes Absolute: 0.9 10*3/uL (ref 0.1–1.0)
Neutro Abs: 6.3 10*3/uL (ref 1.7–7.7)
Neutrophils Relative %: 71 %
Platelets: 275 10*3/uL (ref 150–400)
RBC: 3.62 MIL/uL — ABNORMAL LOW (ref 4.22–5.81)
RDW: 15.8 % — ABNORMAL HIGH (ref 11.5–15.5)
WBC: 9 10*3/uL (ref 4.0–10.5)

## 2016-05-04 LAB — BASIC METABOLIC PANEL
Anion gap: 10 (ref 5–15)
BUN: 16 mg/dL (ref 6–20)
CHLORIDE: 100 mmol/L — AB (ref 101–111)
CO2: 27 mmol/L (ref 22–32)
Calcium: 9.3 mg/dL (ref 8.9–10.3)
Creatinine, Ser: 0.75 mg/dL (ref 0.61–1.24)
GFR calc Af Amer: >60 mL/min (ref >60)
GFR calc non Af Amer: >60 mL/min (ref >60)
Glucose, Bld: 85 mg/dL (ref 65–99)
POTASSIUM: 4.4 mmol/L (ref 3.5–5.1)
Sodium: 137 mmol/L (ref 135–145)

## 2016-05-04 LAB — SEDIMENTATION RATE: SED RATE: 74 mm/h — AB (ref 0–16)

## 2016-05-04 NOTE — Telephone Encounter (Signed)
I actually got in touch with Pam over weekend. Cyn you need to go enjoy your vacation !!!!

## 2016-05-04 NOTE — Telephone Encounter (Signed)
Per Dr. Baxter Flattery and Dr. Tommy Medal, called Jonathan Holden at [facility] and gave verbal orders to d/c ertapenem and vanc and transition patient back to ceftriaxone 2 gm daily until 4/30.  Will re-evaluate abx at that time. Jonathan Holden verbalized understanding.

## 2016-05-04 NOTE — Telephone Encounter (Signed)
I had called Pam and someone else at [facility] this weekend too   Patient and girlfriend also now said they wanted to switch over to me as main ID MD which I am ok with as long is Dr. Baxter Flattery is and pt understands that he would have to see my partners when I am not available.  Counselled to keep appts that are currently on schedule

## 2016-05-05 DIAGNOSIS — Z452 Encounter for adjustment and management of vascular access device: Secondary | ICD-10-CM | POA: Diagnosis not present

## 2016-05-05 DIAGNOSIS — G9340 Encephalopathy, unspecified: Secondary | ICD-10-CM | POA: Diagnosis not present

## 2016-05-05 DIAGNOSIS — B955 Unspecified streptococcus as the cause of diseases classified elsewhere: Secondary | ICD-10-CM | POA: Diagnosis not present

## 2016-05-05 DIAGNOSIS — M4646 Discitis, unspecified, lumbar region: Secondary | ICD-10-CM | POA: Diagnosis not present

## 2016-05-05 DIAGNOSIS — R7881 Bacteremia: Secondary | ICD-10-CM | POA: Diagnosis not present

## 2016-05-05 DIAGNOSIS — I2510 Atherosclerotic heart disease of native coronary artery without angina pectoris: Secondary | ICD-10-CM | POA: Diagnosis not present

## 2016-05-05 LAB — C-REACTIVE PROTEIN: CRP: 1.1 mg/dL — ABNORMAL HIGH (ref <1.0)

## 2016-05-07 ENCOUNTER — Telehealth: Payer: Self-pay

## 2016-05-07 DIAGNOSIS — I2510 Atherosclerotic heart disease of native coronary artery without angina pectoris: Secondary | ICD-10-CM | POA: Diagnosis not present

## 2016-05-07 DIAGNOSIS — Z452 Encounter for adjustment and management of vascular access device: Secondary | ICD-10-CM | POA: Diagnosis not present

## 2016-05-07 DIAGNOSIS — M4646 Discitis, unspecified, lumbar region: Secondary | ICD-10-CM | POA: Diagnosis not present

## 2016-05-07 DIAGNOSIS — B955 Unspecified streptococcus as the cause of diseases classified elsewhere: Secondary | ICD-10-CM | POA: Diagnosis not present

## 2016-05-07 DIAGNOSIS — G9340 Encephalopathy, unspecified: Secondary | ICD-10-CM | POA: Diagnosis not present

## 2016-05-07 DIAGNOSIS — R7881 Bacteremia: Secondary | ICD-10-CM | POA: Diagnosis not present

## 2016-05-07 NOTE — Telephone Encounter (Signed)
Referral to [facility] filled out and faxed on 05/07/2016.

## 2016-05-08 ENCOUNTER — Telehealth: Payer: Self-pay | Admitting: Interventional Cardiology

## 2016-05-08 ENCOUNTER — Other Ambulatory Visit: Payer: Medicare Other

## 2016-05-08 DIAGNOSIS — E785 Hyperlipidemia, unspecified: Secondary | ICD-10-CM

## 2016-05-08 DIAGNOSIS — I25118 Atherosclerotic heart disease of native coronary artery with other forms of angina pectoris: Secondary | ICD-10-CM

## 2016-05-08 DIAGNOSIS — E784 Other hyperlipidemia: Secondary | ICD-10-CM | POA: Diagnosis not present

## 2016-05-08 DIAGNOSIS — E7849 Other hyperlipidemia: Secondary | ICD-10-CM

## 2016-05-08 LAB — LIPID PANEL
CHOLESTEROL TOTAL: 90 mg/dL — AB (ref 100–199)
Chol/HDL Ratio: 2.3 ratio (ref 0.0–5.0)
HDL: 40 mg/dL (ref >39)
LDL Calculated: 24 mg/dL (ref 0–99)
Triglycerides: 128 mg/dL (ref 0–149)
VLDL Cholesterol Cal: 26 mg/dL (ref 5–40)

## 2016-05-08 LAB — LDL CHOLESTEROL, DIRECT: LDL DIRECT: 33 mg/dL (ref 0–99)

## 2016-05-08 NOTE — Telephone Encounter (Signed)
Returned call to pt. Will leave samples at the front desk of Praluent 150mg /dL. Will also check labs today since pt has not had an updated lipid panel in 2 years and he reports he lost 30 lbs in the hospital. Adding direct LDL since pt ate cereal a few hours ago.

## 2016-05-08 NOTE — Addendum Note (Signed)
Addended by: Velna Ochs on: 05/08/2016 11:28 AM   Modules accepted: Orders

## 2016-05-08 NOTE — Telephone Encounter (Signed)
New message    Pt is calling for Megan. He said he needs praluent.

## 2016-05-08 NOTE — Progress Notes (Signed)
Lipids 

## 2016-05-09 DIAGNOSIS — I2510 Atherosclerotic heart disease of native coronary artery without angina pectoris: Secondary | ICD-10-CM | POA: Diagnosis not present

## 2016-05-09 DIAGNOSIS — D62 Acute posthemorrhagic anemia: Secondary | ICD-10-CM | POA: Diagnosis not present

## 2016-05-09 DIAGNOSIS — Z792 Long term (current) use of antibiotics: Secondary | ICD-10-CM | POA: Diagnosis not present

## 2016-05-09 DIAGNOSIS — I38 Endocarditis, valve unspecified: Secondary | ICD-10-CM | POA: Diagnosis not present

## 2016-05-09 DIAGNOSIS — I480 Paroxysmal atrial fibrillation: Secondary | ICD-10-CM | POA: Diagnosis not present

## 2016-05-09 DIAGNOSIS — G9340 Encephalopathy, unspecified: Secondary | ICD-10-CM | POA: Diagnosis not present

## 2016-05-09 DIAGNOSIS — K922 Gastrointestinal hemorrhage, unspecified: Secondary | ICD-10-CM | POA: Diagnosis not present

## 2016-05-09 DIAGNOSIS — B955 Unspecified streptococcus as the cause of diseases classified elsewhere: Secondary | ICD-10-CM | POA: Diagnosis not present

## 2016-05-09 DIAGNOSIS — M4646 Discitis, unspecified, lumbar region: Secondary | ICD-10-CM | POA: Diagnosis not present

## 2016-05-09 DIAGNOSIS — I272 Pulmonary hypertension, unspecified: Secondary | ICD-10-CM | POA: Diagnosis not present

## 2016-05-09 DIAGNOSIS — R7881 Bacteremia: Secondary | ICD-10-CM | POA: Diagnosis not present

## 2016-05-09 DIAGNOSIS — Z452 Encounter for adjustment and management of vascular access device: Secondary | ICD-10-CM | POA: Diagnosis not present

## 2016-05-09 DIAGNOSIS — I10 Essential (primary) hypertension: Secondary | ICD-10-CM | POA: Diagnosis not present

## 2016-05-09 DIAGNOSIS — Z7901 Long term (current) use of anticoagulants: Secondary | ICD-10-CM | POA: Diagnosis not present

## 2016-05-09 DIAGNOSIS — E785 Hyperlipidemia, unspecified: Secondary | ICD-10-CM | POA: Diagnosis not present

## 2016-05-09 DIAGNOSIS — Z86718 Personal history of other venous thrombosis and embolism: Secondary | ICD-10-CM | POA: Diagnosis not present

## 2016-05-11 ENCOUNTER — Other Ambulatory Visit (HOSPITAL_COMMUNITY)
Admission: AD | Admit: 2016-05-11 | Discharge: 2016-05-11 | Disposition: A | Discharge status: Home / Self Care | Payer: Medicare Other | Source: Skilled Nursing Facility | Attending: Infectious Disease | Admitting: Infectious Disease

## 2016-05-11 DIAGNOSIS — M4646 Discitis, unspecified, lumbar region: Secondary | ICD-10-CM | POA: Diagnosis not present

## 2016-05-11 DIAGNOSIS — Z452 Encounter for adjustment and management of vascular access device: Secondary | ICD-10-CM | POA: Diagnosis not present

## 2016-05-11 DIAGNOSIS — B955 Unspecified streptococcus as the cause of diseases classified elsewhere: Secondary | ICD-10-CM | POA: Diagnosis not present

## 2016-05-11 DIAGNOSIS — R7881 Bacteremia: Secondary | ICD-10-CM | POA: Diagnosis not present

## 2016-05-11 DIAGNOSIS — G9340 Encephalopathy, unspecified: Secondary | ICD-10-CM | POA: Diagnosis not present

## 2016-05-11 DIAGNOSIS — I38 Endocarditis, valve unspecified: Secondary | ICD-10-CM | POA: Diagnosis not present

## 2016-05-11 LAB — CBC WITH DIFFERENTIAL/PLATELET
Basophils Absolute: 0.1 10*3/uL (ref 0.0–0.1)
Basophils Relative: 1 %
Eosinophils Absolute: 0.3 10*3/uL (ref 0.0–0.7)
Eosinophils Relative: 4 %
HEMATOCRIT: 33.7 % — AB (ref 39.0–52.0)
Hemoglobin: 10.8 g/dL — ABNORMAL LOW (ref 13.0–17.0)
LYMPHS PCT: 25 %
Lymphs Abs: 1.6 10*3/uL (ref 0.7–4.0)
MCH: 29.3 pg (ref 26.0–34.0)
MCHC: 32 g/dL (ref 30.0–36.0)
MCV: 91.6 fL (ref 78.0–100.0)
MONO ABS: 0.7 10*3/uL (ref 0.1–1.0)
MONOS PCT: 11 %
NEUTROS ABS: 3.8 10*3/uL (ref 1.7–7.7)
Neutrophils Relative %: 59 %
Platelets: 284 10*3/uL (ref 150–400)
RBC: 3.68 MIL/uL — ABNORMAL LOW (ref 4.22–5.81)
RDW: 15.7 % — AB (ref 11.5–15.5)
WBC: 6.4 10*3/uL (ref 4.0–10.5)

## 2016-05-11 LAB — BASIC METABOLIC PANEL
Anion gap: 10 (ref 5–15)
BUN: 16 mg/dL (ref 6–20)
CALCIUM: 9.5 mg/dL (ref 8.9–10.3)
CO2: 24 mmol/L (ref 22–32)
Chloride: 104 mmol/L (ref 101–111)
Creatinine, Ser: 0.88 mg/dL (ref 0.61–1.24)
GFR calc Af Amer: >60 mL/min (ref >60)
GFR calc non Af Amer: >60 mL/min (ref >60)
Glucose, Bld: 107 mg/dL — ABNORMAL HIGH (ref 65–99)
POTASSIUM: 4.1 mmol/L (ref 3.5–5.1)
Sodium: 138 mmol/L (ref 135–145)

## 2016-05-11 LAB — SEDIMENTATION RATE: Sed Rate: 97 mm/hr — ABNORMAL HIGH (ref 0–16)

## 2016-05-11 NOTE — Progress Notes (Signed)
[name] has come in today to discuss the Provider Referral Exercise Program (PREP) in which Dr. Juleen China has referred him into.  He states he has had a significant improvement in his gait over the last week or so having gone from using a walker, to a cane, and for the last 4 days no assistive device.  He has also been driving the last several days.  He does feel sharp pains in his back when hinging forward and is planning on setting up a follow-up appt with his infectious disease MD today so he can see if he may be close to the end of his IV abx administrations.  He would like to f/u with me in the next week or two to register for the PREP after knowing more about his current condition.

## 2016-05-12 LAB — C-REACTIVE PROTEIN: CRP: 2.8 mg/dL — ABNORMAL HIGH (ref <1.0)

## 2016-05-13 ENCOUNTER — Telehealth: Payer: Self-pay

## 2016-05-13 DIAGNOSIS — Z452 Encounter for adjustment and management of vascular access device: Secondary | ICD-10-CM | POA: Diagnosis not present

## 2016-05-13 DIAGNOSIS — M4646 Discitis, unspecified, lumbar region: Secondary | ICD-10-CM | POA: Diagnosis not present

## 2016-05-13 DIAGNOSIS — I38 Endocarditis, valve unspecified: Secondary | ICD-10-CM | POA: Diagnosis not present

## 2016-05-13 DIAGNOSIS — B955 Unspecified streptococcus as the cause of diseases classified elsewhere: Secondary | ICD-10-CM | POA: Diagnosis not present

## 2016-05-13 DIAGNOSIS — R7881 Bacteremia: Secondary | ICD-10-CM | POA: Diagnosis not present

## 2016-05-13 DIAGNOSIS — G9340 Encephalopathy, unspecified: Secondary | ICD-10-CM | POA: Diagnosis not present

## 2016-05-13 NOTE — Telephone Encounter (Signed)
Patient is calling for lab results that should have been available yesterday .  He has requested a call as soon as possible.   Please advise.    Laverle Patter, RN

## 2016-05-13 NOTE — Telephone Encounter (Signed)
Replied to phone call. Reviewed ESR and CRP. I DONT like getting these weekly labs. THe patients pain as measured by waht he has in the am when standing is MUCH better than a month ago. He still DOES have pain 24-7. His MRI is better.   He will see Dr. Baxter Flattery next week. I would consider changing to an oral antibiotic for further protracted therapy and repeat MRI down the road 1-3 months after most recent one. He will see Dr. Baxter Flattery next week

## 2016-05-14 ENCOUNTER — Telehealth: Payer: Self-pay

## 2016-05-14 DIAGNOSIS — B955 Unspecified streptococcus as the cause of diseases classified elsewhere: Secondary | ICD-10-CM | POA: Diagnosis not present

## 2016-05-14 DIAGNOSIS — R7881 Bacteremia: Secondary | ICD-10-CM | POA: Diagnosis not present

## 2016-05-14 NOTE — Telephone Encounter (Signed)
Spoke with patient.  Patient going to [facility] for ID appointment today.  Records from Dr. Juleen China were faxed to [facility] yesterday.  [facility] wants [facility] records.  Advised patient that he will have to request that [facility] send those records to [facility].  Also advised that those records should be visible to [facility] under Care Everywhere.  Patient verbalized understanding and will call [facility] for records.

## 2016-05-18 ENCOUNTER — Telehealth: Payer: Self-pay | Admitting: Family Medicine

## 2016-05-18 ENCOUNTER — Other Ambulatory Visit (HOSPITAL_COMMUNITY)
Admission: AD | Admit: 2016-05-18 | Discharge: 2016-05-18 | Disposition: A | Discharge status: Home / Self Care | Payer: Medicare Other | Source: Skilled Nursing Facility | Attending: Infectious Disease | Admitting: Infectious Disease

## 2016-05-18 DIAGNOSIS — R7881 Bacteremia: Secondary | ICD-10-CM | POA: Diagnosis not present

## 2016-05-18 DIAGNOSIS — M4646 Discitis, unspecified, lumbar region: Secondary | ICD-10-CM | POA: Diagnosis not present

## 2016-05-18 DIAGNOSIS — Z452 Encounter for adjustment and management of vascular access device: Secondary | ICD-10-CM | POA: Diagnosis not present

## 2016-05-18 DIAGNOSIS — G9340 Encephalopathy, unspecified: Secondary | ICD-10-CM | POA: Diagnosis not present

## 2016-05-18 DIAGNOSIS — B955 Unspecified streptococcus as the cause of diseases classified elsewhere: Secondary | ICD-10-CM | POA: Diagnosis not present

## 2016-05-18 DIAGNOSIS — I38 Endocarditis, valve unspecified: Secondary | ICD-10-CM | POA: Diagnosis not present

## 2016-05-18 LAB — BASIC METABOLIC PANEL
Anion gap: 9 (ref 5–15)
BUN: 15 mg/dL (ref 6–20)
CHLORIDE: 108 mmol/L (ref 101–111)
CO2: 24 mmol/L (ref 22–32)
Calcium: 9.3 mg/dL (ref 8.9–10.3)
Creatinine, Ser: 0.78 mg/dL (ref 0.61–1.24)
GFR calc Af Amer: >60 mL/min (ref >60)
GFR calc non Af Amer: >60 mL/min (ref >60)
GLUCOSE: 113 mg/dL — AB (ref 65–99)
POTASSIUM: 4.1 mmol/L (ref 3.5–5.1)
SODIUM: 141 mmol/L (ref 135–145)

## 2016-05-18 LAB — CBC WITH DIFFERENTIAL/PLATELET
Basophils Absolute: 0 10*3/uL (ref 0.0–0.1)
Basophils Relative: 1 %
EOS ABS: 0.3 10*3/uL (ref 0.0–0.7)
Eosinophils Relative: 4 %
HCT: 33.5 % — ABNORMAL LOW (ref 39.0–52.0)
HEMOGLOBIN: 10.7 g/dL — AB (ref 13.0–17.0)
LYMPHS ABS: 1.2 10*3/uL (ref 0.7–4.0)
Lymphocytes Relative: 19 %
MCH: 29.4 pg (ref 26.0–34.0)
MCHC: 31.9 g/dL (ref 30.0–36.0)
MCV: 92 fL (ref 78.0–100.0)
Monocytes Absolute: 0.7 10*3/uL (ref 0.1–1.0)
Monocytes Relative: 10 %
NEUTROS ABS: 4.3 10*3/uL (ref 1.7–7.7)
NEUTROS PCT: 66 %
Platelets: 255 10*3/uL (ref 150–400)
RBC: 3.64 MIL/uL — AB (ref 4.22–5.81)
RDW: 16.9 % — ABNORMAL HIGH (ref 11.5–15.5)
WBC: 6.5 10*3/uL (ref 4.0–10.5)

## 2016-05-18 LAB — SEDIMENTATION RATE: SED RATE: 74 mm/h — AB (ref 0–16)

## 2016-05-18 LAB — C-REACTIVE PROTEIN: CRP: 2.4 mg/dL — AB (ref <1.0)

## 2016-05-18 NOTE — Telephone Encounter (Signed)
Occupational therapist "Manuela Schwartz" requesting order for the next two weeks for patient.   She says it's ok to leave the orders on her voicemail if you desire.  Thank you,  -LL

## 2016-05-18 NOTE — Telephone Encounter (Signed)
Called Manuela Schwartz, left voicemail giving verbal order.

## 2016-05-19 DIAGNOSIS — B955 Unspecified streptococcus as the cause of diseases classified elsewhere: Secondary | ICD-10-CM | POA: Diagnosis not present

## 2016-05-19 DIAGNOSIS — R7881 Bacteremia: Secondary | ICD-10-CM | POA: Diagnosis not present

## 2016-05-19 DIAGNOSIS — Z452 Encounter for adjustment and management of vascular access device: Secondary | ICD-10-CM | POA: Diagnosis not present

## 2016-05-19 DIAGNOSIS — G9340 Encephalopathy, unspecified: Secondary | ICD-10-CM | POA: Diagnosis not present

## 2016-05-19 DIAGNOSIS — I38 Endocarditis, valve unspecified: Secondary | ICD-10-CM | POA: Diagnosis not present

## 2016-05-19 DIAGNOSIS — M4646 Discitis, unspecified, lumbar region: Secondary | ICD-10-CM | POA: Diagnosis not present

## 2016-05-20 DIAGNOSIS — I38 Endocarditis, valve unspecified: Secondary | ICD-10-CM | POA: Diagnosis not present

## 2016-05-20 DIAGNOSIS — G9340 Encephalopathy, unspecified: Secondary | ICD-10-CM | POA: Diagnosis not present

## 2016-05-20 DIAGNOSIS — R7881 Bacteremia: Secondary | ICD-10-CM | POA: Diagnosis not present

## 2016-05-20 DIAGNOSIS — B955 Unspecified streptococcus as the cause of diseases classified elsewhere: Secondary | ICD-10-CM | POA: Diagnosis not present

## 2016-05-20 DIAGNOSIS — Z452 Encounter for adjustment and management of vascular access device: Secondary | ICD-10-CM | POA: Diagnosis not present

## 2016-05-20 DIAGNOSIS — M4646 Discitis, unspecified, lumbar region: Secondary | ICD-10-CM | POA: Diagnosis not present

## 2016-05-21 ENCOUNTER — Ambulatory Visit (INDEPENDENT_AMBULATORY_CARE_PROVIDER_SITE_OTHER): Payer: Medicare Other | Admitting: Internal Medicine

## 2016-05-21 ENCOUNTER — Encounter: Payer: Self-pay | Admitting: Internal Medicine

## 2016-05-21 VITALS — BP 137/78 | HR 89 | Temp 98.0°F | Ht 74.0 in | Wt 199.0 lb

## 2016-05-21 DIAGNOSIS — M5440 Lumbago with sciatica, unspecified side: Secondary | ICD-10-CM

## 2016-05-21 DIAGNOSIS — M4646 Discitis, unspecified, lumbar region: Secondary | ICD-10-CM | POA: Diagnosis not present

## 2016-05-21 DIAGNOSIS — A491 Streptococcal infection, unspecified site: Secondary | ICD-10-CM | POA: Diagnosis not present

## 2016-05-21 DIAGNOSIS — I2510 Atherosclerotic heart disease of native coronary artery without angina pectoris: Secondary | ICD-10-CM

## 2016-05-21 NOTE — Progress Notes (Signed)
RFV: streptococcal osteomyelitis  Patient ID: Jonathan Holden, male   DOB: March 24, 1945, 71 y.o.   MRN: [id number]  HPI Jonathan Holden is a 68IH M with complicated streptococcal bacteremia with L4-L5,L5-S1 lumbosacral discitis. Who was initially treated with ceftriaxone but due to inflammatory marker still being elevated in early April. Repeat imaging was done on 4/14. Also he was seen by Dr Cyndy Freeze from [place] who felt that he did not require surgical intervention.IV abtx temporarily broadened in case had worsening spinal osteo. MRI imaging appeared overall improved with epidural abscess nearly completely resolved. There still was evidence of L3-L4 edema that could explain the patients back pain. There is still phlegmon at hte level but the liquid component is nearly completely disappeared but overall size is the same. With these findings the patient was kept on ceftriaxone.  Though clinically looks improved, with better mobility, his inflammatory markers still remain high. He does state he does have back pain worsening after physical exertion. He is not taking any opiates (including no tramadol, nor muscle relaxants) since it causes too much sedation for him.   Outpatient Encounter Prescriptions as of 05/21/2016  Medication Sig  . Alirocumab (PRALUENT) 150 MG/ML SOPN Inject 1 pen into the skin every 14 (fourteen) days.  Marland Kitchen amiodarone (PACERONE) 200 MG tablet Take 1 tablet (200 mg total) by mouth daily.  Marland Kitchen atorvastatin (LIPITOR) 40 MG tablet Take 40 mg by mouth at bedtime.  . cefTRIAXone (ROCEPHIN) 10 g injection Take as directed.  . diphenhydramine-acetaminophen (TYLENOL PM) 25-500 MG TABS tablet Take 1 tablet by mouth at bedtime as needed.  . Melatonin 3 MG TABS Take 3 tablets (9 mg total) by mouth at bedtime as needed (sleep).  . metoprolol tartrate (LOPRESSOR) 25 MG tablet Take 1 tablet (25 mg total) by mouth at bedtime.  . Multiple Vitamin (MULTIVITAMIN WITH MINERALS) TABS tablet Take 1 tablet  by mouth daily.   . pregabalin (LYRICA) 75 MG capsule Take 1 capsule (75 mg total) by mouth daily.  Marland Kitchen senna-docusate (SENOKOT-S) 8.6-50 MG tablet Take 1 tablet by mouth 2 (two) times daily. (Patient taking differently: Take 1 tablet by mouth as needed. )  . oxyCODONE (OXY IR/ROXICODONE) 5 MG immediate release tablet Take 1 tablet (5 mg total) by mouth every 6 (six) hours as needed for severe pain. (Patient not taking: Reported on 05/21/2016)  . traMADol (ULTRAM) 50 MG tablet Take 1 tablet (50 mg total) by mouth every 6 (six) hours as needed. (Patient not taking: Reported on 05/21/2016)   No facility-administered encounter medications on file as of 05/21/2016.      Patient Active Problem List   Diagnosis Date Noted  . Diverticulosis 04/18/2016  . Insomnia 04/18/2016  . Arthralgia 04/18/2016  . Muscular deconditioning 04/18/2016  . CHF (congestive heart failure) ([place]) 04/18/2016  . H/O gastric ulcer   . Former smoker   . On amiodarone therapy 04/09/2016  . Contraindication to anticoagulation therapy 03/25/2016  . Gastroesophageal reflux disease   . Acute blood loss anemia   . Endocarditis of native valve   . Infection of intervertebral disc ([place])   . Streptococcal bacteremia   . Paroxysmal atrial fibrillation with RVR ([place]) 02/21/2016  . 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 Left retinal detachment 03/18/2015  . CAD (coronary artery disease) 09/19/2014  . HTN (hypertension) 09/19/2014  . Hyperlipidemia, unspecified 10/03/2013  . Pseudophakia of right eye 06/05/2013  . Pseudophakia of left eye 05/22/2013     Health Maintenance Due  Topic Date Due  . TETANUS/TDAP  07/31/1963  . PNA vac Low Risk Adult (1 of 2 - PCV13) 07/30/2009     Review of Systems + low back pain, deconditioning. 12 point rosi is otherwise negative Physical Exam   BP 137/78   Pulse 89   Temp 98 F (36.7 C) (Oral)   Ht 6\' 2"  (1.88 m)   Wt 199 lb (90.3 kg)   BMI 25.55 kg/m   Physical  Exam  Constitutional: He is oriented to person, place, and time. He appears well-developed and well-nourished. No distress.  HENT:  Mouth/Throat: Oropharynx is clear and moist. No oropharyngeal exudate.  Cardiovascular: Normal rate, regular rhythm and normal heart sounds. Exam reveals no gallop and no friction rub.  No murmur heard.  Pulmonary/Chest: Effort normal and breath sounds normal. No respiratory distress. He has no wheezes.  Back = mild paraspinal tenderness in lumbar area neurol: He is alert and oriented to person, place, and time.  Skin: Skin is warm and dry. No rash noted. No erythema.  Psychiatric: He has a normal mood and affect. His behavior is normal.    CBC Lab Results  Component Value Date   WBC 6.5 05/18/2016   RBC 3.64 (L) 05/18/2016   HGB 10.7 (L) 05/18/2016   HCT 33.5 (L) 05/18/2016   PLT 255 05/18/2016   MCV 92.0 05/18/2016   MCH 29.4 05/18/2016   MCHC 31.9 05/18/2016   RDW 16.9 (H) 05/18/2016   LYMPHSABS 1.2 05/18/2016   MONOABS 0.7 05/18/2016   EOSABS 0.3 05/18/2016    BMET Lab Results  Component Value Date   NA 141 05/18/2016   K 4.1 05/18/2016   CL 108 05/18/2016   CO2 24 05/18/2016   GLUCOSE 113 (H) 05/18/2016   BUN 15 05/18/2016   CREATININE 0.78 05/18/2016   CALCIUM 9.3 05/18/2016   GFRNONAA >60 05/18/2016   GFRAA >60 05/18/2016    Lab Results  Component Value Date   ESRSEDRATE 74 (H) 05/18/2016   Lab Results  Component Value Date   CRP 2.4 (H) 05/18/2016     Assessment and Plan  Streptococcal lumbosacral discitis= -plan for  2 more weeks of IV therapy with ceftriaxone then switch to orals, such as PCN/cephalexin  Deconditioning = consider doing PT in pool,  Pain management = can do a trial of lidocaine cream to see if it improves symptoms

## 2016-05-25 ENCOUNTER — Encounter: Payer: Self-pay | Admitting: Infectious Disease

## 2016-05-25 ENCOUNTER — Telehealth: Payer: Self-pay | Admitting: Family Medicine

## 2016-05-25 DIAGNOSIS — B955 Unspecified streptococcus as the cause of diseases classified elsewhere: Secondary | ICD-10-CM | POA: Diagnosis not present

## 2016-05-25 DIAGNOSIS — Z452 Encounter for adjustment and management of vascular access device: Secondary | ICD-10-CM | POA: Diagnosis not present

## 2016-05-25 DIAGNOSIS — R7881 Bacteremia: Secondary | ICD-10-CM | POA: Diagnosis not present

## 2016-05-25 DIAGNOSIS — I38 Endocarditis, valve unspecified: Secondary | ICD-10-CM | POA: Diagnosis not present

## 2016-05-25 DIAGNOSIS — M4646 Discitis, unspecified, lumbar region: Secondary | ICD-10-CM | POA: Diagnosis not present

## 2016-05-25 DIAGNOSIS — G9340 Encephalopathy, unspecified: Secondary | ICD-10-CM | POA: Diagnosis not present

## 2016-05-25 NOTE — Telephone Encounter (Signed)
Patient Name: Jonathan Holden  DOB: 07/24/1945    Initial Comment Caller states that [name] has taken the antibiotics at home. She has noticed some issues. He is itching when he take the antibiotics. He is having an allergic reaction. He has been on pain medication for a long time. His cheeks are red and forehead. Now the pink on the face is itching now when she gives him the medicine. She spoke with the [place] health care nurse and she told her to ask the doctor about the reaction. He is taking Rocephin 68ml. He has noticed it about a week   Nurse Assessment  Nurse: Leilani Merl, RN, Nira Conn Date/Time (Eastern Time): 05/25/2016 1:10:02 PM  Confirm and document reason for call. If symptomatic, describe symptoms. ---Caller has been on rocephin 10 ml since March and about 10 days ago, he started flushing in the face and itching all over, but no rash and his face is still flushed. He noticed that it happens at the same time as the injection.  Does the patient have any new or worsening symptoms? ---Yes  Will a triage be completed? ---Yes  Related visit to physician within the last 2 weeks? ---No  Does the PT have any chronic conditions? (i.e. diabetes, asthma, etc.) ---Yes  List chronic conditions. ---See MR  Is this a behavioral health or substance abuse call? ---No     Guidelines    Guideline Title Affirmed Question Affirmed Notes  Itching - Widespread Taking prescription medication that could cause itching (e.g., codeine/morphine/other opiates, aspirin)    Final Disposition User   Call PCP within 24 Hours Stand[place], RN, Water quality scientist    Comments  Please call caller back and let him know if he needs to continue the medication or if he needs to stop the medication and be given a different medication.   Disagree/Comply: Comply

## 2016-05-25 NOTE — Telephone Encounter (Signed)
Please advise 

## 2016-05-25 NOTE — Telephone Encounter (Signed)
Noted  

## 2016-05-25 NOTE — Telephone Encounter (Signed)
Please Advise. Routing per Dr.Wallace

## 2016-05-25 NOTE — Telephone Encounter (Signed)
Stated Jonathan Holden was having a negative reaction to antibiotic, sent to [facility] for further evaluation.

## 2016-05-25 NOTE — Telephone Encounter (Signed)
This question needs to be routed to ID.

## 2016-05-25 NOTE — Telephone Encounter (Signed)
Attempted to contact office an make MD aware of message being routed. Got a recording that the office is closed.

## 2016-05-26 ENCOUNTER — Telehealth: Payer: Self-pay | Admitting: Internal Medicine

## 2016-05-26 ENCOUNTER — Ambulatory Visit (INDEPENDENT_AMBULATORY_CARE_PROVIDER_SITE_OTHER): Payer: Medicare Other | Admitting: Internal Medicine

## 2016-05-26 ENCOUNTER — Other Ambulatory Visit: Payer: Self-pay | Admitting: Pharmacist

## 2016-05-26 ENCOUNTER — Telehealth: Payer: Self-pay | Admitting: Pharmacist

## 2016-05-26 ENCOUNTER — Encounter: Payer: Self-pay | Admitting: Internal Medicine

## 2016-05-26 VITALS — BP 143/84 | HR 91 | Temp 97.9°F | Wt 197.0 lb

## 2016-05-26 DIAGNOSIS — I2510 Atherosclerotic heart disease of native coronary artery without angina pectoris: Secondary | ICD-10-CM | POA: Diagnosis not present

## 2016-05-26 DIAGNOSIS — L500 Allergic urticaria: Secondary | ICD-10-CM

## 2016-05-26 DIAGNOSIS — A491 Streptococcal infection, unspecified site: Secondary | ICD-10-CM

## 2016-05-26 DIAGNOSIS — T50905A Adverse effect of unspecified drugs, medicaments and biological substances, initial encounter: Secondary | ICD-10-CM

## 2016-05-26 DIAGNOSIS — M4646 Discitis, unspecified, lumbar region: Secondary | ICD-10-CM | POA: Diagnosis not present

## 2016-05-26 DIAGNOSIS — Z889 Allergy status to unspecified drugs, medicaments and biological substances status: Secondary | ICD-10-CM | POA: Diagnosis not present

## 2016-05-26 LAB — CBC WITH DIFFERENTIAL/PLATELET
BASOS PCT: 1 %
Basophils Absolute: 60 cells/uL (ref 0–200)
EOS PCT: 3 %
Eosinophils Absolute: 180 cells/uL (ref 15–500)
HEMATOCRIT: 33.7 % — AB (ref 38.5–50.0)
Hemoglobin: 10.8 g/dL — ABNORMAL LOW (ref 13.2–17.1)
LYMPHS PCT: 19 %
Lymphs Abs: 1140 cells/uL (ref 850–3900)
MCH: 29.1 pg (ref 27.0–33.0)
MCHC: 32 g/dL (ref 32.0–36.0)
MCV: 90.8 fL (ref 80.0–100.0)
MONO ABS: 540 {cells}/uL (ref 200–950)
MPV: 8.7 fL (ref 7.5–12.5)
Monocytes Relative: 9 %
NEUTROS PCT: 68 %
Neutro Abs: 4080 cells/uL (ref 1500–7800)
PLATELETS: 230 10*3/uL (ref 140–400)
RBC: 3.71 MIL/uL — AB (ref 4.20–5.80)
RDW: 18 % — AB (ref 11.0–15.0)
WBC: 6 10*3/uL (ref 3.8–10.8)

## 2016-05-26 MED ORDER — AMOXICILLIN 500 MG PO CAPS: 500.0000 mg | ORAL_CAPSULE | Freq: Three times a day (TID) | ORAL | 3 refills | Status: DC

## 2016-05-26 MED ORDER — LINEZOLID 600 MG PO TABS: 600.0000 mg | ORAL_TABLET | Freq: Two times a day (BID) | ORAL | 3 refills | Status: DC

## 2016-05-26 NOTE — Telephone Encounter (Signed)
Spoke to [place] at [facility] and gave verbal per Dr. Baxter Flattery to stop IV Rocephin and stop lab draws.  Patient will continue with PICC until further instructions.  Arbie Cookey verbalized understanding.

## 2016-05-26 NOTE — Telephone Encounter (Signed)
Spoke to [place] from [facility]. Sharyn Lull stated patient has appointment with provider there this afternoon.

## 2016-05-26 NOTE — Telephone Encounter (Signed)
Called patient and left voicemail to stop abtx. andcome to clinic this afternoon so that we can assess his rash/reaction to cephalosporins to decide on alternative

## 2016-05-26 NOTE — Telephone Encounter (Signed)
Made second attempt to contact Infectious Disease office; left voicemail to call office back. Spoke to patient. Patient states he has been on antibiotic for several months, no reaction. Within past week, he received feedback from caregiver he gets "blush" with each injection. Further, has developed to itching/hives on torso and upper extremities.   Patient states not able to find relief of symptoms. Educated patient to make continues efforts to contact ID's office for direction of care around antibiotics and reaction.   Advised on reaction: Ensure someone is with you when you take medication and up to 45 minutes after receiving medication. Be aware of any symptoms of itchy throat, throat swelling, wheezing, chest tightness, inability to catch breath or tongue swelling. If such occur, go to ED immediately. Patient verbalized understanding and states it "isn't that bad." However, found it to be appropriate to make patient aware of potential harm.   Will continue to make efforts to contact ID and have directed patient to do so as well.

## 2016-05-26 NOTE — Progress Notes (Signed)
rfv: possible drug reaction/rash  Patient ID: Jonathan Holden, male   DOB: 1945/07/22, 71 y.o.   MRN: [id number]  HPI Jonathan Holden is a 71yo M with strep intermedius bacteremia and discitis who has been on ceftriaxone. We saw him last week where he was feeling better, with no difficulty but in the last 4 days he is starting to have facial and chest flushing at the time of abtx infusion. In the last 2 days, he is noticing lingering pruritic sensation. He is unable to tell if he has ongoing rash. No pruritis around picc line.  Scattered macules on chest and back? Possibly related to ctx  All: allergy to FQ  Outpatient Encounter Prescriptions as of 05/26/2016  Medication Sig  . Alirocumab (PRALUENT) 150 MG/ML SOPN Inject 1 pen into the skin every 14 (fourteen) days.  Marland Kitchen amiodarone (PACERONE) 200 MG tablet Take 1 tablet (200 mg total) by mouth daily.  Marland Kitchen atorvastatin (LIPITOR) 40 MG tablet Take 40 mg by mouth at bedtime.  . cefTRIAXone (ROCEPHIN) 10 g injection Take as directed.  . diphenhydramine-acetaminophen (TYLENOL PM) 25-500 MG TABS tablet Take 1 tablet by mouth at bedtime as needed.  . Melatonin 3 MG TABS Take 3 tablets (9 mg total) by mouth at bedtime as needed (sleep).  . metoprolol tartrate (LOPRESSOR) 25 MG tablet Take 1 tablet (25 mg total) by mouth at bedtime.  . Multiple Vitamin (MULTIVITAMIN WITH MINERALS) TABS tablet Take 1 tablet by mouth daily.   Marland Kitchen oxyCODONE (OXY IR/ROXICODONE) 5 MG immediate release tablet Take 1 tablet (5 mg total) by mouth every 6 (six) hours as needed for severe pain.  . pregabalin (LYRICA) 75 MG capsule Take 1 capsule (75 mg total) by mouth daily.  Marland Kitchen senna-docusate (SENOKOT-S) 8.6-50 MG tablet Take 1 tablet by mouth 2 (two) times daily. (Patient taking differently: Take 1 tablet by mouth as needed. )  . traMADol (ULTRAM) 50 MG tablet Take 1 tablet (50 mg total) by mouth every 6 (six) hours as needed.   No facility-administered encounter medications on file as  of 05/26/2016.      Patient Active Problem List   Diagnosis Date Noted  . Diverticulosis 04/18/2016  . Insomnia 04/18/2016  . Arthralgia 04/18/2016  . Muscular deconditioning 04/18/2016  . CHF (congestive heart failure) ([place]) 04/18/2016  . H/O gastric ulcer   . Former smoker   . On amiodarone therapy 04/09/2016  . Contraindication to anticoagulation therapy 03/25/2016  . Gastroesophageal reflux disease   . Acute blood loss anemia   . Endocarditis of native valve   . Infection of intervertebral disc ([place])   . Streptococcal bacteremia   . Paroxysmal atrial fibrillation with RVR ([place]) 02/21/2016  . 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 Left retinal detachment 03/18/2015  . CAD (coronary artery disease) 09/19/2014  . HTN (hypertension) 09/19/2014  . Hyperlipidemia, unspecified 10/03/2013  . Pseudophakia of right eye 06/05/2013  . Pseudophakia of left eye 05/22/2013     Health Maintenance Due  Topic Date Due  . TETANUS/TDAP  07/31/1963  . PNA vac Low Risk Adult (1 of 2 - PCV13) 07/30/2009    Social History  Substance Use Topics  . Smoking status: Former Smoker    Packs/day: 2.00    Years: 20.00    Types: Cigarettes    Quit date: 03/27/1990  . Smokeless tobacco: Never Used  . Alcohol use 6.0 oz/week    10 Glasses of wine per week   Review of Systems Per hpi,  otherwise 12 point ros is negative Physical Exam   BP (!) 143/84   Wt 197 lb (89.4 kg)   BMI 25.29 kg/m   Physical Exam  Constitutional: He is oriented to person, place, and time. He appears well-developed and well-nourished. No distress.  HENT:  Mouth/Throat: Oropharynx is clear and moist. No oropharyngeal exudate.  Cardiovascular: Normal rate, regular rhythm and normal heart sounds. Exam reveals no gallop and no friction rub.  No murmur heard.  Pulmonary/Chest: Effort normal and breath sounds normal. No respiratory distress. He has no wheezes.  Back/skin: scattered macules, non blanching (  roughly 10 lesions) Neurological: He is alert and oriented to person, place, and time.  Ext: right arm picc line c/d/i Psychiatric: He has a normal mood and affect. His behavior is normal.    CBC Lab Results  Component Value Date   WBC 6.5 05/18/2016   RBC 3.64 (L) 05/18/2016   HGB 10.7 (L) 05/18/2016   HCT 33.5 (L) 05/18/2016   PLT 255 05/18/2016   MCV 92.0 05/18/2016   MCH 29.4 05/18/2016   MCHC 31.9 05/18/2016   RDW 16.9 (H) 05/18/2016   LYMPHSABS 1.2 05/18/2016   MONOABS 0.7 05/18/2016   EOSABS 0.3 05/18/2016    BMET Lab Results  Component Value Date   NA 141 05/18/2016   K 4.1 05/18/2016   CL 108 05/18/2016   CO2 24 05/18/2016   GLUCOSE 113 (H) 05/18/2016   BUN 15 05/18/2016   CREATININE 0.78 05/18/2016   CALCIUM 9.3 05/18/2016   GFRNONAA >60 05/18/2016   GFRAA >60 05/18/2016   Lab Results  Component Value Date   ESRSEDRATE 47 (H) 05/26/2016   Lab Results  Component Value Date   CRP 15.3 (H) 05/26/2016     Assessment and Plan  Discitis with possibly cephalosporin allergy = clinical history suggests that he is developing cephalosporin rash. Will have him discontinue abtx.  Will check sed rate and  crp and cbc with diff, possibly will see eos as sign of allergy - will do a trial of amoxicillin with hopes that there is not a cross reactivity, if so, we will switch to linezolid. - for pruritis, will recommend benadryl and zyrtec to ameliorate symptoms  Spent 45 min with patient regarding counseling nad coordination of care for abtx allergy

## 2016-05-27 ENCOUNTER — Other Ambulatory Visit: Payer: Self-pay | Admitting: Pharmacist

## 2016-05-27 ENCOUNTER — Telehealth: Payer: Self-pay | Admitting: Pharmacist

## 2016-05-27 DIAGNOSIS — Z452 Encounter for adjustment and management of vascular access device: Secondary | ICD-10-CM | POA: Diagnosis not present

## 2016-05-27 DIAGNOSIS — B955 Unspecified streptococcus as the cause of diseases classified elsewhere: Secondary | ICD-10-CM | POA: Diagnosis not present

## 2016-05-27 DIAGNOSIS — R7881 Bacteremia: Secondary | ICD-10-CM | POA: Diagnosis not present

## 2016-05-27 DIAGNOSIS — I38 Endocarditis, valve unspecified: Secondary | ICD-10-CM | POA: Diagnosis not present

## 2016-05-27 DIAGNOSIS — G9340 Encephalopathy, unspecified: Secondary | ICD-10-CM | POA: Diagnosis not present

## 2016-05-27 DIAGNOSIS — M4646 Discitis, unspecified, lumbar region: Secondary | ICD-10-CM | POA: Diagnosis not present

## 2016-05-27 LAB — C-REACTIVE PROTEIN: CRP: 15.3 mg/L — ABNORMAL HIGH (ref <8.0)

## 2016-05-27 LAB — SEDIMENTATION RATE: Sed Rate: 47 mm/hr — ABNORMAL HIGH (ref 0–20)

## 2016-05-27 NOTE — Telephone Encounter (Addendum)
Patient had reaction to amoxicillin last night.  Sent Linezolid 600 mg PO BID to [facility] and the out of pocket cost for him is $291/month.  He said it was too much and cost prohibitive.  Sent in medication assistance application to drug [employer].  Told Chet it would like be a few day before we heard back. He agrees with plan and is fine waiting.   Called Debbie at [facility] and gave verbal order from Dr. Baxter Flattery to pull patient's PICC.

## 2016-05-29 ENCOUNTER — Other Ambulatory Visit: Payer: Self-pay | Admitting: Pharmacist

## 2016-05-29 ENCOUNTER — Telehealth: Payer: Self-pay | Admitting: Pharmacist

## 2016-05-29 DIAGNOSIS — A491 Streptococcal infection, unspecified site: Secondary | ICD-10-CM

## 2016-05-29 MED ORDER — LINEZOLID 600 MG PO TABS: 600.0000 mg | ORAL_TABLET | Freq: Two times a day (BID) | ORAL | 3 refills | Status: DC

## 2016-05-29 NOTE — Telephone Encounter (Signed)
I was able to get a 30 day supply from [employer] for Jonathan Holden.  Called him and let him know that it will be delivered tomorrow. Counseled him on how to take and side effects.  Made a lab appointment for him on 5/22 at 10:45am.

## 2016-06-04 DIAGNOSIS — S63501A Unspecified sprain of right wrist, initial encounter: Secondary | ICD-10-CM | POA: Diagnosis not present

## 2016-06-04 NOTE — Progress Notes (Signed)
Cardiology Office Note    Date:  06/05/2016   ID:  Christop, Hippert [birth year], MRN [id number]  PCP:  Briscoe Deutscher, DO  Cardiologist: Sinclair Grooms, MD   Chief Complaint  Patient presents with  . Atrial Fibrillation    History of Present Illness:  Jonathan Holden is a 71 y.o. male with a prior history of coronary artery disease, right coronary angioplasty during acute infarction 1990's, history of gastric ulcer, recurrent diverticular colonic bleeding episodes on Eliquis, recent diagnosis of atrial fibrillation during acute illness for which Eliquis was started and amiodarone used to control rate.  No recurrent palpitations or near-syncope. No neurological complaints. Chills, fever, anorexia, have all improved.  Past Medical History:  Diagnosis Date  . Acute blood loss anemia   . Acute lower GI bleeding 03/19/2016  . Arthritis   . Atrial fibrillation with RVR ([place]) 02/21/2016  . Coronary artery disease   . Diverticulosis 04/18/2016  . Endocarditis of native valve   . Former smoker   . H/O gastric ulcer   . History of MI (myocardial infarction) 1992  . Hyperlipidemia   . Hypertension   . Infection of intervertebral disc ([place])   . Insomnia 04/18/2016  . Left retinal detachment 03/18/2015  . Loose body of right shoulder   . 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 Pseudophakia of left eye 05/22/2013  . Pseudophakia of right eye 06/05/2013  . S/P coronary angioplasty   . Streptococcal bacteremia     Past Surgical History:  Procedure Laterality Date  . CARDIOVASCULAR STRESS TEST  08/22/2008  . CORONARY ANGIOPLASTY  1992  . ERCP W/ SPHICTEROTOMY  12/15/2002  . LAPAROSCOPIC CHOLECYSTECTOMY  12/16/2002  . LUMBAR LAMINECTOMY     L5  . TEE WITHOUT CARDIOVERSION N/A 02/28/2016   Procedure: TRANSESOPHAGEAL ECHOCARDIOGRAM (TEE);  Surgeon: Fay Records, MD;  Location: [facility] ENDOSCOPY;  Service: Cardiovascular;  Laterality: N/A;    Current  Medications: Outpatient Medications Prior to Visit  Medication Sig Dispense Refill  . Alirocumab (PRALUENT) 150 MG/ML SOPN Inject 1 pen into the skin every 14 (fourteen) days.    Marland Kitchen amiodarone (PACERONE) 200 MG tablet Take 1 tablet (200 mg total) by mouth daily. 90 tablet 3  . atorvastatin (LIPITOR) 40 MG tablet Take 40 mg by mouth at bedtime.    . diphenhydramine-acetaminophen (TYLENOL PM) 25-500 MG TABS tablet Take 1 tablet by mouth at bedtime as needed.    Marland Kitchen linezolid (ZYVOX) 600 MG tablet Take 1 tablet (600 mg total) by mouth 2 (two) times daily. 60 tablet 3  . metoprolol tartrate (LOPRESSOR) 25 MG tablet Take 1 tablet (25 mg total) by mouth at bedtime. 90 tablet 3  . Multiple Vitamin (MULTIVITAMIN WITH MINERALS) TABS tablet Take 1 tablet by mouth daily.     Marland Kitchen amoxicillin (AMOXIL) 500 MG capsule Take 1 capsule (500 mg total) by mouth 3 (three) times daily. (Patient not taking: Reported on 06/05/2016) 90 capsule 3  . cefTRIAXone (ROCEPHIN) 10 g injection Take as directed.    . Melatonin 3 MG TABS Take 3 tablets (9 mg total) by mouth at bedtime as needed (sleep). (Patient not taking: Reported on 06/05/2016) 12 tablet 0  . oxyCODONE (OXY IR/ROXICODONE) 5 MG immediate release tablet Take 1 tablet (5 mg total) by mouth every 6 (six) hours as needed for severe pain. (Patient not taking: Reported on 06/05/2016) 30 tablet 0  . pregabalin (LYRICA) 75 MG capsule Take 1 capsule (75 mg total)  by mouth daily. (Patient not taking: Reported on 06/05/2016) 30 capsule 1  . senna-docusate (SENOKOT-S) 8.6-50 MG tablet Take 1 tablet by mouth 2 (two) times daily. (Patient not taking: Reported on 06/05/2016) 30 tablet 0  . traMADol (ULTRAM) 50 MG tablet Take 1 tablet (50 mg total) by mouth every 6 (six) hours as needed. (Patient not taking: Reported on 06/05/2016) 60 tablet 1   No facility-administered medications prior to visit.      Allergies:   Eliquis [apixaban]; Food; and Ciprofloxacin   Social History   Social  History  . Marital status: Divorced    Spouse name: N/A  . Number of children: N/A  . Years of education: N/A   Social History Main Topics  . Smoking status: Former Smoker    Packs/day: 2.00    Years: 20.00    Types: Cigarettes    Quit date: 03/27/1990  . Smokeless tobacco: Never Used  . Alcohol use 6.0 oz/week    10 Glasses of wine per week  . Drug use: No  . Sexual activity: Not Currently   Other Topics Concern  . None   Social History Narrative  . None     Family History:  The patient's family history includes Heart attack (age of onset: 91) in his father; Heart disease in his maternal grandfather and paternal grandfather; Other in his brother and mother.   ROS:   Please see the history of present illness.    Continues with back discomfort but otherwise no complaints.  All other systems reviewed and are negative.   PHYSICAL EXAM:   VS:  BP (!) 142/84 (BP Location: Right Arm)   Pulse 75   Ht 6\' 2"  (1.88 m)   Wt 200 lb (90.7 kg)   BMI 25.68 kg/m    GEN: Well nourished, well developed, in no acute distress  HEENT: normal  Neck: no JVD, carotid bruits, or masses Cardiac: RRR; no murmurs, rubs, or gallops,no edema  Respiratory:  clear to auscultation bilaterally, normal work of breathing GI: soft, nontender, nondistended, + BS MS: no deformity or atrophy  Skin: warm and dry, no rash Neuro:  Alert and Oriented x 3, Strength and sensation are intact Psych: euthymic mood, full affect  Wt Readings from Last 3 Encounters:  06/05/16 200 lb (90.7 kg)  05/26/16 197 lb (89.4 kg)  05/21/16 199 lb (90.3 kg)      Studies/Labs Reviewed:   EKG:  EKG  Normal sinus rhythm with overall normal appearance.  Recent Labs: 03/11/2016: TSH 1.148 03/22/2016: Magnesium 1.7 04/20/2016: ALT 18 05/18/2016: BUN 15; Creatinine, Ser 0.78; Potassium 4.1; Sodium 141 05/26/2016: Hemoglobin 10.8; Platelets 230   Lipid Panel    Component Value Date/Time   CHOL 90 (L) 05/08/2016 1128   TRIG  128 05/08/2016 1128   HDL 40 05/08/2016 1128   CHOLHDL 2.3 05/08/2016 1128   CHOLHDL 2 10/22/2014 1631   VLDL 22.2 10/22/2014 1631   LDLCALC 24 05/08/2016 1128   LDLDIRECT 33 05/08/2016 1128   LDLDIRECT 143.5 12/06/2012 0909    Additional studies/ records that were reviewed today include:  None.  Reviewed the infectious disease note from [facility].    ASSESSMENT:    1. Coronary artery disease of native artery of native heart with stable angina pectoris (HCC)   2. Paroxysmal atrial fibrillation with RVR (HCC)   3. Endocarditis of native valve   4. Acute on chronic diastolic heart failure ([place])   5. Other hyperlipidemia  6. On amiodarone therapy      PLAN:  In order of problems listed above:  1. Asymptomatic without angina 2. Rhythm control on amiodarone 3. Not addressed. No murmur or clinical evidence of valve dysfunction 4. Heart failure is resolved. 5. Praluent therapy will be continued 6. Decrease amiodarone to 100 mg per day starting August 1 with office visit 3 months later at which time we may consider discontinuation of amiodarone if no interval recurrences of atrial fibrillation.    Medication Adjustments/Labs and Tests Ordered: Current medicines are reviewed at length with the patient today.  Concerns regarding medicines are outlined above.  Medication changes, Labs and Tests ordered today are listed in the Patient Instructions below. Patient Instructions  Medication Instructions:  1) In August, DECREASE Amiodarone to 100mg  once daily.  Labwork: None  Testing/Procedures: .None  Follow-Up: Your physician wants you to follow-up in: November with Dr. Tamala Julian.  You will receive a reminder letter in the mail two months in advance. If you don't receive a letter, please call our office to schedule the follow-up appointment.   Any Other Special Instructions Will Be Listed Below (If Applicable).     If you need a refill on your cardiac  medications before your next appointment, please call your pharmacy.      Signed, Sinclair Grooms, MD  06/05/2016 10:06 AM    [facility] [place], [place],   [address] Phone: [phone]; Fax: [phone]

## 2016-06-05 ENCOUNTER — Ambulatory Visit (INDEPENDENT_AMBULATORY_CARE_PROVIDER_SITE_OTHER): Payer: Medicare Other | Admitting: Interventional Cardiology

## 2016-06-05 ENCOUNTER — Encounter: Payer: Self-pay | Admitting: Interventional Cardiology

## 2016-06-05 VITALS — BP 142/84 | HR 75 | Ht 74.0 in | Wt 200.0 lb

## 2016-06-05 DIAGNOSIS — E784 Other hyperlipidemia: Secondary | ICD-10-CM

## 2016-06-05 DIAGNOSIS — I25118 Atherosclerotic heart disease of native coronary artery with other forms of angina pectoris: Secondary | ICD-10-CM | POA: Diagnosis not present

## 2016-06-05 DIAGNOSIS — I38 Endocarditis, valve unspecified: Secondary | ICD-10-CM | POA: Diagnosis not present

## 2016-06-05 DIAGNOSIS — I209 Angina pectoris, unspecified: Secondary | ICD-10-CM | POA: Diagnosis not present

## 2016-06-05 DIAGNOSIS — Z79899 Other long term (current) drug therapy: Secondary | ICD-10-CM | POA: Diagnosis not present

## 2016-06-05 DIAGNOSIS — E7849 Other hyperlipidemia: Secondary | ICD-10-CM

## 2016-06-05 DIAGNOSIS — I2510 Atherosclerotic heart disease of native coronary artery without angina pectoris: Secondary | ICD-10-CM

## 2016-06-05 DIAGNOSIS — I480 Paroxysmal atrial fibrillation: Secondary | ICD-10-CM

## 2016-06-05 DIAGNOSIS — I5033 Acute on chronic diastolic (congestive) heart failure: Secondary | ICD-10-CM

## 2016-06-05 NOTE — Patient Instructions (Addendum)
Medication Instructions:  1) In August, DECREASE Amiodarone to 100mg  once daily.  Labwork: None  Testing/Procedures: .None  Follow-Up: Your physician wants you to follow-up in: November with Dr. Tamala Julian with an EKG.  You will receive a reminder letter in the mail two months in advance. If you don't receive a letter, please call our office to schedule the follow-up appointment.   Any Other Special Instructions Will Be Listed Below (If Applicable).     If you need a refill on your cardiac medications before your next appointment, please call your pharmacy.

## 2016-06-09 ENCOUNTER — Other Ambulatory Visit: Payer: Medicare Other

## 2016-06-09 DIAGNOSIS — A491 Streptococcal infection, unspecified site: Secondary | ICD-10-CM | POA: Diagnosis not present

## 2016-06-09 LAB — BASIC METABOLIC PANEL
BUN: 22 mg/dL (ref 7–25)
CO2: 25 mmol/L (ref 20–31)
CREATININE: 0.9 mg/dL (ref 0.70–1.18)
Calcium: 9.8 mg/dL (ref 8.6–10.3)
Chloride: 104 mmol/L (ref 98–110)
GLUCOSE: 198 mg/dL — AB (ref 65–99)
Potassium: 4.9 mmol/L (ref 3.5–5.3)
Sodium: 138 mmol/L (ref 135–146)

## 2016-06-09 LAB — CBC
HCT: 38.8 % (ref 38.5–50.0)
HEMOGLOBIN: 12 g/dL — AB (ref 13.2–17.1)
MCH: 29.3 pg (ref 27.0–33.0)
MCHC: 30.9 g/dL — AB (ref 32.0–36.0)
MCV: 94.6 fL (ref 80.0–100.0)
MPV: 8.9 fL (ref 7.5–12.5)
Platelets: 175 10*3/uL (ref 140–400)
RBC: 4.1 MIL/uL — ABNORMAL LOW (ref 4.20–5.80)
RDW: 18.7 % — ABNORMAL HIGH (ref 11.0–15.0)
WBC: 5.4 10*3/uL (ref 3.8–10.8)

## 2016-06-10 LAB — C-REACTIVE PROTEIN: CRP: 8.1 mg/L — ABNORMAL HIGH (ref <8.0)

## 2016-06-10 LAB — SEDIMENTATION RATE: SED RATE: 24 mm/h — AB (ref 0–20)

## 2016-06-16 ENCOUNTER — Other Ambulatory Visit: Payer: Self-pay | Admitting: *Deleted

## 2016-06-16 ENCOUNTER — Telehealth: Payer: Self-pay | Admitting: Interventional Cardiology

## 2016-06-16 MED ORDER — ATORVASTATIN CALCIUM 40 MG PO TABS: 40.0000 mg | ORAL_TABLET | Freq: Every day | ORAL | 3 refills | Status: DC

## 2016-06-16 NOTE — Telephone Encounter (Signed)
New Message  Pt voiced wanting to speak with nurse and for nurse to give him a call.  Please f/u

## 2016-06-17 DIAGNOSIS — H04123 Dry eye syndrome of bilateral lacrimal glands: Secondary | ICD-10-CM | POA: Diagnosis not present

## 2016-06-17 DIAGNOSIS — H3522 Other non-diabetic proliferative retinopathy, left eye: Secondary | ICD-10-CM | POA: Diagnosis not present

## 2016-06-17 DIAGNOSIS — H26493 Other secondary cataract, bilateral: Secondary | ICD-10-CM | POA: Diagnosis not present

## 2016-06-23 ENCOUNTER — Encounter: Payer: Self-pay | Admitting: Internal Medicine

## 2016-06-23 ENCOUNTER — Ambulatory Visit (INDEPENDENT_AMBULATORY_CARE_PROVIDER_SITE_OTHER): Payer: Medicare Other | Admitting: Internal Medicine

## 2016-06-23 VITALS — BP 136/83 | HR 84 | Temp 97.9°F | Ht 74.0 in | Wt 201.0 lb

## 2016-06-23 DIAGNOSIS — I2510 Atherosclerotic heart disease of native coronary artery without angina pectoris: Secondary | ICD-10-CM

## 2016-06-23 DIAGNOSIS — R197 Diarrhea, unspecified: Secondary | ICD-10-CM | POA: Diagnosis not present

## 2016-06-23 DIAGNOSIS — L239 Allergic contact dermatitis, unspecified cause: Secondary | ICD-10-CM

## 2016-06-23 DIAGNOSIS — Z79899 Other long term (current) drug therapy: Secondary | ICD-10-CM | POA: Diagnosis not present

## 2016-06-23 DIAGNOSIS — M4644 Discitis, unspecified, thoracic region: Secondary | ICD-10-CM | POA: Diagnosis not present

## 2016-06-23 LAB — CBC WITH DIFFERENTIAL/PLATELET
BASOS ABS: 0 {cells}/uL (ref 0–200)
Basophils Relative: 0 %
Eosinophils Absolute: 264 cells/uL (ref 15–500)
Eosinophils Relative: 4 %
HEMATOCRIT: 33 % — AB (ref 38.5–50.0)
HEMOGLOBIN: 10.6 g/dL — AB (ref 13.2–17.1)
LYMPHS ABS: 990 {cells}/uL (ref 850–3900)
Lymphocytes Relative: 15 %
MCH: 28.8 pg (ref 27.0–33.0)
MCHC: 32.1 g/dL (ref 32.0–36.0)
MCV: 89.7 fL (ref 80.0–100.0)
MONO ABS: 726 {cells}/uL (ref 200–950)
MPV: 9.3 fL (ref 7.5–12.5)
Monocytes Relative: 11 %
NEUTROS PCT: 70 %
Neutro Abs: 4620 cells/uL (ref 1500–7800)
Platelets: 153 10*3/uL (ref 140–400)
RBC: 3.68 MIL/uL — AB (ref 4.20–5.80)
RDW: 18.5 % — ABNORMAL HIGH (ref 11.0–15.0)
WBC: 6.6 10*3/uL (ref 3.8–10.8)

## 2016-06-23 MED ORDER — TRIAMCINOLONE ACETONIDE 0.025 % EX OINT: 1.0000 "application " | TOPICAL_OINTMENT | Freq: Two times a day (BID) | CUTANEOUS | 0 refills | Status: DC

## 2016-06-23 NOTE — Progress Notes (Signed)
Patient ID: Jonathan Holden, male   DOB: 1945/05/09, 71 y.o.   MRN: [id number]  HPI Jonathan Holden is a 71yo M with strep intermedius bacteremia and discitis who has been on ceftriaxone up until the first week of may, where he may have developed intolerance to ceftriaxone. He was changed to linezolid on 5/11. Roughly on it for the past 2-3 wk.  He has some mild back pain but manages with heating pad  Of late started to have 3 loose-watery stools with some abdominal cramping no blood x 10-14days  Starting back to work, he did 8 hrs today  Outpatient Encounter Prescriptions as of 06/23/2016  Medication Sig  . Alirocumab (PRALUENT) 150 MG/ML SOPN Inject 1 pen into the skin every 14 (fourteen) days.  Marland Kitchen amiodarone (PACERONE) 200 MG tablet Take 1 tablet (200 mg total) by mouth daily.  Marland Kitchen atorvastatin (LIPITOR) 40 MG tablet Take 1 tablet (40 mg total) by mouth at bedtime.  Marland Kitchen linezolid (ZYVOX) 600 MG tablet Take 1 tablet (600 mg total) by mouth 2 (two) times daily.  . metoprolol tartrate (LOPRESSOR) 25 MG tablet Take 1 tablet (25 mg total) by mouth at bedtime.  . [DISCONTINUED] diphenhydramine-acetaminophen (TYLENOL PM) 25-500 MG TABS tablet Take 1 tablet by mouth at bedtime as needed.  . [DISCONTINUED] Multiple Vitamin (MULTIVITAMIN WITH MINERALS) TABS tablet Take 1 tablet by mouth daily.    No facility-administered encounter medications on file as of 06/23/2016.      Patient Active Problem List   Diagnosis Date Noted  . Diverticulosis 04/18/2016  . Insomnia 04/18/2016  . Arthralgia 04/18/2016  . Muscular deconditioning 04/18/2016  . Acute on chronic diastolic heart failure ([place]) 04/18/2016  . H/O gastric ulcer   . Former smoker   . On amiodarone therapy 04/09/2016  . Contraindication to anticoagulation therapy 03/25/2016  . Gastroesophageal reflux disease   . Acute blood loss anemia   . Endocarditis of native valve   . Infection of intervertebral disc ([place])   . Streptococcal bacteremia   .  Paroxysmal atrial fibrillation with RVR ([place]) 02/21/2016  . 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 Left retinal detachment 03/18/2015  . CAD (coronary artery disease) 09/19/2014  . HTN (hypertension) 09/19/2014  . Hyperlipidemia, unspecified 10/03/2013  . Pseudophakia of right eye 06/05/2013  . Pseudophakia of left eye 05/22/2013     Health Maintenance Due  Topic Date Due  . TETANUS/TDAP  07/31/1963  . PNA vac Low Risk Adult (1 of 2 - PCV13) 07/30/2009     Review of Systems +diarrhea. Ongoing back pain. Fatigue from deconditioning. Otherwise 12 point ros is negative Physical Exam   BP 136/83   Pulse 84   Temp 97.9 F (36.6 C) (Oral)   Ht 6\' 2"  (1.88 m)   Wt 201 lb (91.2 kg)   BMI 25.81 kg/m   Physical Exam  Constitutional: He is oriented to person, place, and time. He appears well-developed and well-nourished. No distress.  HENT:  Mouth/Throat: Oropharynx is clear and moist. No oropharyngeal exudate.  Cardiovascular: Normal rate, regular rhythm and normal heart sounds. Exam reveals no gallop and no friction rub.  No murmur heard.  Pulmonary/Chest: Effort normal and breath sounds normal. No respiratory distress. He has no wheezes.  Abdominal: Soft. Bowel sounds are normal. He exhibits no distension. There is no tenderness.  Lymphadenopathy:  He has no cervical adenopathy.  Neurological: He is alert and oriented to person, place, and time.  Skin: large well circumscribed edematous plaque  to left ac and area behind right knee with central puncture ? bite Psychiatric: He has a normal mood and affect. His behavior is normal.    CBC Lab Results  Component Value Date   WBC 5.4 06/09/2016   RBC 4.10 (L) 06/09/2016   HGB 12.0 (L) 06/09/2016   HCT 38.8 06/09/2016   PLT 175 06/09/2016   MCV 94.6 06/09/2016   MCH 29.3 06/09/2016   MCHC 30.9 (L) 06/09/2016   RDW 18.7 (H) 06/09/2016   LYMPHSABS 1,140 05/26/2016   MONOABS 540 05/26/2016   EOSABS 180  05/26/2016    BMET Lab Results  Component Value Date   NA 138 06/09/2016   K 4.9 06/09/2016   CL 104 06/09/2016   CO2 25 06/09/2016   GLUCOSE 198 (H) 06/09/2016   BUN 22 06/09/2016   CREATININE 0.90 06/09/2016   CALCIUM 9.8 06/09/2016   GFRNONAA >60 05/18/2016   GFRAA >60 05/18/2016    Lab Results  Component Value Date   ESRSEDRATE 24 (H) 06/09/2016   Lab Results  Component Value Date   CRP 8.1 (H) 06/09/2016     Assessment and Plan  Discitis = will check his cbc and cmp. Will check sed rate and crp. But nearly normalized last week (though it maybe elevated if he is developing cdiff)  Drug monitoring of high risk medication = need to check his plt while on linezolid  Diarrhea = at risk for cdifficile given all the abtx. Will check his cdiff  Troubleshooting bills = he has outstanding $1700 for [facility] home [facility], maybe a coding issue  Dermatitis = left AC and right knee - it appears c/w with allergic reaction to bite. We will give him steroid cream to see if it improves. He is on linezolid which would cover staph/strep associated cellulitis. Also recommended zyrtec/ranitidine  Addendum -   Elevated crp and sed rate likely due to inflammation associated with recent insect bites rather than underlying infection that we have been treating. Will continue to monitor response to steroids and follow up at next visit. Plan is unchanged

## 2016-06-24 ENCOUNTER — Other Ambulatory Visit: Payer: Medicare Other

## 2016-06-24 DIAGNOSIS — R197 Diarrhea, unspecified: Secondary | ICD-10-CM

## 2016-06-24 LAB — COMPLETE METABOLIC PANEL WITH GFR
ALT: 16 U/L (ref 9–46)
AST: 16 U/L (ref 10–35)
Albumin: 4.1 g/dL (ref 3.6–5.1)
Alkaline Phosphatase: 85 U/L (ref 40–115)
BUN: 18 mg/dL (ref 7–25)
CHLORIDE: 100 mmol/L (ref 98–110)
CO2: 23 mmol/L (ref 20–31)
Calcium: 9.2 mg/dL (ref 8.6–10.3)
Creat: 1 mg/dL (ref 0.70–1.18)
GFR, Est African American: 87 mL/min (ref >=60)
GFR, Est Non African American: 75 mL/min (ref >=60)
GLUCOSE: 99 mg/dL (ref 65–99)
Potassium: 4.3 mmol/L (ref 3.5–5.3)
SODIUM: 135 mmol/L (ref 135–146)
Total Bilirubin: 0.5 mg/dL (ref 0.2–1.2)
Total Protein: 6.8 g/dL (ref 6.1–8.1)

## 2016-06-24 LAB — C-REACTIVE PROTEIN: CRP: 59 mg/L — ABNORMAL HIGH (ref <8.0)

## 2016-06-24 LAB — SEDIMENTATION RATE: SED RATE: 44 mm/h — AB (ref 0–20)

## 2016-06-25 DIAGNOSIS — H26491 Other secondary cataract, right eye: Secondary | ICD-10-CM | POA: Diagnosis not present

## 2016-06-25 DIAGNOSIS — Z961 Presence of intraocular lens: Secondary | ICD-10-CM | POA: Diagnosis not present

## 2016-06-25 DIAGNOSIS — H26492 Other secondary cataract, left eye: Secondary | ICD-10-CM | POA: Diagnosis not present

## 2016-06-26 ENCOUNTER — Telehealth: Payer: Self-pay | Admitting: Internal Medicine

## 2016-06-26 ENCOUNTER — Other Ambulatory Visit: Payer: Self-pay | Admitting: Internal Medicine

## 2016-06-26 DIAGNOSIS — M4646 Discitis, unspecified, lumbar region: Secondary | ICD-10-CM

## 2016-06-26 LAB — CLOSTRIDIUM DIFFICILE BY PCR: Toxigenic C. Difficile by PCR: NOT DETECTED

## 2016-06-26 NOTE — Telephone Encounter (Signed)
Spoke to the patient and gave results. Negative cdifficile testing. He also states his rash is improved on his arm and leg. He has 5 days left of linezolid. I asked him to check labs next week, sed rate and crp. Currently elevated but likely due to acute allergy response

## 2016-07-01 ENCOUNTER — Other Ambulatory Visit: Payer: Medicare Other

## 2016-07-02 ENCOUNTER — Other Ambulatory Visit: Payer: Medicare Other

## 2016-07-02 DIAGNOSIS — M4646 Discitis, unspecified, lumbar region: Secondary | ICD-10-CM | POA: Diagnosis not present

## 2016-07-03 LAB — C-REACTIVE PROTEIN: CRP: 2.3 mg/L (ref <8.0)

## 2016-07-03 LAB — SEDIMENTATION RATE: SED RATE: 21 mm/h — AB (ref 0–20)

## 2016-07-08 ENCOUNTER — Telehealth: Payer: Self-pay | Admitting: *Deleted

## 2016-07-08 NOTE — Telephone Encounter (Signed)
Patient is calling for lab results, interpretation, steps forward. Please advise. Landis Gandy, RN

## 2016-07-10 NOTE — Telephone Encounter (Signed)
Sed rate is 21 crp is 2.3. All looking great. Finish up his current abtx

## 2016-07-10 NOTE — Telephone Encounter (Signed)
Relayed info to patient.  He completed antibiotics Monday. He will call us with any questions between now and his follow up in July.

## 2016-07-16 DIAGNOSIS — Z9889 Other specified postprocedural states: Secondary | ICD-10-CM | POA: Diagnosis not present

## 2016-08-04 ENCOUNTER — Telehealth: Payer: Self-pay | Admitting: Family Medicine

## 2016-08-04 NOTE — Telephone Encounter (Signed)
Patient has been made aware that the coder has sent a message to charge corrections to re-file to the insurance. No further action required at this time.

## 2016-08-04 NOTE — Telephone Encounter (Signed)
Patient called with questions about his 04/20/16 visit and the code that was used for a collection. I advised patient that I would message one of our coders to look into this matter and will call him with an update once I have one for him. Awaiting a response from the coder.

## 2016-08-10 ENCOUNTER — Ambulatory Visit: Payer: Medicare Other | Admitting: Internal Medicine

## 2016-08-10 ENCOUNTER — Other Ambulatory Visit: Payer: Self-pay | Admitting: *Deleted

## 2016-08-10 DIAGNOSIS — I480 Paroxysmal atrial fibrillation: Secondary | ICD-10-CM

## 2016-08-10 MED ORDER — AMIODARONE HCL 200 MG PO TABS: 100.0000 mg | ORAL_TABLET | Freq: Every day | ORAL | 3 refills | Status: DC

## 2016-11-23 DIAGNOSIS — H20022 Recurrent acute iridocyclitis, left eye: Secondary | ICD-10-CM | POA: Diagnosis not present

## 2016-12-06 NOTE — Progress Notes (Signed)
Cardiology Office Note    Date:  12/07/2016   ID:  Jonathan, Holden 18-Apr-1945, MRN [id number]  PCP:  Jonathan Deutscher, DO  Cardiologist: Jonathan Grooms, MD   Chief Complaint  Patient presents with  . Atrial Fibrillation  . Coronary Artery Disease    Amiodarone therapy    History of Present Illness:  Jonathan Holden is a 71 y.o. male male with a prior history of coronary artery disease, right coronary angioplasty during acute infarction, history of gastric ulcer, recurrent diverticular colonic bleeding episodes on eloquent's, recent diagnosis of atrial fibrillation during acute illness which is persistent, lumbar discitis with streptococcal bacteremia, hyperlipidemia, and hypertension.  Developed a streptococcal lumbar discitis earlier this year.  During the acute illness he had atrial fibrillation and required amiodarone therapy for control.  Even when in atrial fibrillation with rapid ventricular response, there was no significant ischemia or anginal complaints.   Past Medical History:  Diagnosis Date  . Acute blood loss anemia   . Acute lower GI bleeding 03/19/2016  . Arthritis   . Atrial fibrillation with RVR ([place]) 02/21/2016  . Coronary artery disease   . Diverticulosis 04/18/2016  . Endocarditis of native valve   . Former smoker   . H/O gastric ulcer   . History of MI (myocardial infarction) 1992  . Hyperlipidemia   . Hypertension   . Infection of intervertebral disc ([place])   . Insomnia 04/18/2016  . Left retinal detachment 03/18/2015  . Loose body of right shoulder   . 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 Pseudophakia of left eye 05/22/2013  . Pseudophakia of right eye 06/05/2013  . S/P coronary angioplasty   . Streptococcal bacteremia     Past Surgical History:  Procedure Laterality Date  . CARDIOVASCULAR STRESS TEST  08/22/2008  . CORONARY ANGIOPLASTY  1992  . ERCP W/ SPHICTEROTOMY  12/15/2002  . LAPAROSCOPIC CHOLECYSTECTOMY   12/16/2002  . LUMBAR LAMINECTOMY     L5  . SHOULDER ARTHROSCOPY WITH SUBACROMIAL DECOMPRESSION Right 04/01/2011   Performed by Jonathan Holden, Jonathan Record, MD at [facility]  . TRANSESOPHAGEAL ECHOCARDIOGRAM (TEE) N/A 02/28/2016   Performed by Jonathan Records, MD at [facility] ENDOSCOPY    Current Medications: Outpatient Medications Prior to Visit  Medication Sig Dispense Refill  . Alirocumab (PRALUENT) 150 MG/ML SOPN Inject 1 pen into the skin every 14 (fourteen) days.    Marland Kitchen atorvastatin (LIPITOR) 40 MG tablet Take 1 tablet (40 mg total) by mouth at bedtime. 90 tablet 3  . amiodarone (PACERONE) 200 MG tablet Take 0.5 tablets (100 mg total) by mouth daily. 45 tablet 3  . metoprolol tartrate (LOPRESSOR) 25 MG tablet Take 1 tablet (25 mg total) by mouth at bedtime. 90 tablet 3  . linezolid (ZYVOX) 600 MG tablet Take 1 tablet (600 mg total) by mouth 2 (two) times daily. 60 tablet 3  . triamcinolone (KENALOG) 0.025 % ointment Apply 1 application topically 2 (two) times daily. 15 g 0   No facility-administered medications prior to visit.      Allergies:   Eliquis [apixaban]; Food; Glutamic acid; Ciprofloxacin; and Pollen extract   Social History   Socioeconomic History  . Marital status: Divorced    Spouse name: None  . Number of children: None  . Years of education: None  . Highest education level: None  Social Needs  . Financial resource strain: None  . Food insecurity - worry: None  . Food insecurity - inability:  None  . Transportation needs - medical: None  . Transportation needs - non-medical: None  Occupational History  . None  Tobacco Use  . Smoking status: Former Smoker    Packs/day: 2.00    Years: 20.00    Pack years: 40.00    Types: Cigarettes    Last attempt to quit: 03/27/1990    Years since quitting: 26.7  . Smokeless tobacco: Never Used  Substance and Sexual Activity  . Alcohol use: Yes    Alcohol/week: 6.0 oz    Types: 10 Glasses of wine per week  . Drug use: No    . Sexual activity: Not Currently  Other Topics Concern  . None  Social History Narrative  . None     Family History:  The patient's family history includes Emphysema in his unknown relative; Heart attack (age of onset: 9) in his father; Heart disease in his maternal grandfather and paternal grandfather; Other in his brother and mother.   ROS:   Please see the history of present illness.    His back feels stiff but is improving with physical therapy.  Difficulty with balance as mentioned earlier.  Interested in trying to simplify his medical regimen. All other systems reviewed and are negative.   PHYSICAL EXAM:   VS:  BP (!) 154/84   Pulse 74   Ht 6\' 2"  (1.88 m)   Wt 220 lb 6.4 oz (100 kg)   BMI 28.30 kg/m    GEN: Well nourished, well developed, in no acute distress  HEENT: normal  Neck: no JVD, carotid bruits, or masses Cardiac: RRR; no murmurs, rubs, or gallops,no edema  Respiratory:  clear to auscultation bilaterally, normal work of breathing GI: soft, nontender, nondistended, + BS MS: no deformity or atrophy  Skin: warm and dry, no rash Neuro:  Alert and Oriented x 3, Strength and sensation are intact Psych: euthymic mood, full affect  Wt Readings from Last 3 Encounters:  12/07/16 220 lb 6.4 oz (100 kg)  06/23/16 201 lb (91.2 kg)  06/05/16 200 lb (90.7 kg)      Studies/Labs Reviewed:   EKG:  EKG normal sinus rhythm with overall normal appearance to the EKG.  Recent Labs: 03/11/2016: TSH 1.148 03/22/2016: Magnesium 1.7 06/23/2016: ALT 16; BUN 18; Creat 1.00; Hemoglobin 10.6; Platelets 153; Potassium 4.3; Sodium 135   Lipid Panel    Component Value Date/Time   CHOL 90 (L) 05/08/2016 1128   TRIG 128 05/08/2016 1128   HDL 40 05/08/2016 1128   CHOLHDL 2.3 05/08/2016 1128   CHOLHDL 2 10/22/2014 1631   VLDL 22.2 10/22/2014 1631   LDLCALC 24 05/08/2016 1128   LDLDIRECT 33 05/08/2016 1128   LDLDIRECT 143.5 12/06/2012 0909    Additional studies/ Holden that were  reviewed today include:  No new functional data    ASSESSMENT:    1. Paroxysmal atrial fibrillation with RVR ([place])   2. Essential hypertension   3. Coronary artery disease of native artery of native heart with stable angina pectoris ([place])   4. Other hyperlipidemia   5. Acute on chronic diastolic heart failure (HCC)      PLAN:  In order of problems listed above:  1. No recurrence of atrial fibrillation on 100 mg of amiodarone daily.  We will go ahead and discontinue amiodarone as the stress of his intercurrent illness was a likely cause of atrial fibrillation.  If it recurs off amiodarone therapy, will need to consider reinstitution and long-term therapy or other options at  that time.  He was not able to receive anticoagulation because of recurrent bleeding related to gastric disease/GI bleeding. 2. Blood pressure is well controlled. 3. No symptoms to suggest angina/ischemia. 4. He is currently on Praluent and atorvastatin.  Most recent LDL direct in April was 33. 5. No evidence of volume overload or heart failure.  Clinical follow-up in 6 months.  Amiodarone was discontinued.  Liver and lipid panel will be obtained in 6 months.  He has to be taken off of atorvastatin but our Pharm.D. recommends that both Praluent and atorvastatin be continued.  Medication Adjustments/Labs and Tests Ordered: Current medicines are reviewed at length with the patient today.  Concerns regarding medicines are outlined above.  Medication changes, Labs and Tests ordered today are listed in the Patient Instructions below. Patient Instructions  Medication Instructions:  1) STOP Amiodarone.  Labwork: Liver and Lipid in  6 months.  Testing/Procedures: None  Follow-Up: Your physician wants you to follow-up in: 6 months with Dr. Tamala Julian with an EKG.  You will receive a reminder letter in the mail two months in advance. If you don't receive a letter, please call our office to schedule the follow-up  appointment.   Any Other Special Instructions Will Be Listed Below (If Applicable).  [id number]- Call to activate MyChart   If you need a refill on your cardiac medications before your next appointment, please call your pharmacy.      Signed, Jonathan Grooms, MD  12/07/2016 12:40 PM    [facility] [place], [place], [place]  [address] Phone: [phone]; Fax: [phone]

## 2016-12-07 ENCOUNTER — Ambulatory Visit (INDEPENDENT_AMBULATORY_CARE_PROVIDER_SITE_OTHER): Payer: Medicare Other | Admitting: Interventional Cardiology

## 2016-12-07 ENCOUNTER — Encounter: Payer: Self-pay | Admitting: Interventional Cardiology

## 2016-12-07 VITALS — BP 154/84 | HR 74 | Ht 74.0 in | Wt 220.4 lb

## 2016-12-07 DIAGNOSIS — I25118 Atherosclerotic heart disease of native coronary artery with other forms of angina pectoris: Secondary | ICD-10-CM | POA: Diagnosis not present

## 2016-12-07 DIAGNOSIS — I5033 Acute on chronic diastolic (congestive) heart failure: Secondary | ICD-10-CM | POA: Diagnosis not present

## 2016-12-07 DIAGNOSIS — E7849 Other hyperlipidemia: Secondary | ICD-10-CM

## 2016-12-07 DIAGNOSIS — I209 Angina pectoris, unspecified: Secondary | ICD-10-CM

## 2016-12-07 DIAGNOSIS — I10 Essential (primary) hypertension: Secondary | ICD-10-CM | POA: Diagnosis not present

## 2016-12-07 DIAGNOSIS — I480 Paroxysmal atrial fibrillation: Secondary | ICD-10-CM | POA: Diagnosis not present

## 2016-12-07 DIAGNOSIS — I2510 Atherosclerotic heart disease of native coronary artery without angina pectoris: Secondary | ICD-10-CM | POA: Diagnosis not present

## 2016-12-07 NOTE — Patient Instructions (Signed)
Medication Instructions:  1) STOP Amiodarone.  Labwork: Liver and Lipid in  6 months.  Testing/Procedures: None  Follow-Up: Your physician wants you to follow-up in: 6 months with Dr. Tamala Julian with an EKG.  You will receive a reminder letter in the mail two months in advance. If you don't receive a letter, please call our office to schedule the follow-up appointment.   Any Other Special Instructions Will Be Listed Below (If Applicable).  [phone]- Call to activate MyChart   If you need a refill on your cardiac medications before your next appointment, please call your pharmacy.

## 2016-12-08 ENCOUNTER — Ambulatory Visit: Payer: Medicare Other | Admitting: Interventional Cardiology

## 2016-12-09 DIAGNOSIS — H43821 Vitreomacular adhesion, right eye: Secondary | ICD-10-CM | POA: Diagnosis not present

## 2016-12-09 DIAGNOSIS — H3522 Other non-diabetic proliferative retinopathy, left eye: Secondary | ICD-10-CM | POA: Diagnosis not present

## 2016-12-09 DIAGNOSIS — H59812 Chorioretinal scars after surgery for detachment, left eye: Secondary | ICD-10-CM | POA: Diagnosis not present

## 2016-12-09 DIAGNOSIS — H35373 Puckering of macula, bilateral: Secondary | ICD-10-CM | POA: Diagnosis not present

## 2016-12-09 DIAGNOSIS — Z8669 Personal history of other diseases of the nervous system and sense organs: Secondary | ICD-10-CM | POA: Diagnosis not present

## 2016-12-16 ENCOUNTER — Encounter (HOSPITAL_COMMUNITY): Payer: Self-pay | Admitting: Emergency Medicine

## 2016-12-16 ENCOUNTER — Telehealth: Payer: Self-pay | Admitting: Interventional Cardiology

## 2016-12-16 ENCOUNTER — Emergency Department (HOSPITAL_COMMUNITY)
Admission: EM | Admit: 2016-12-16 | Discharge: 2016-12-16 | Disposition: A | Discharge status: Home / Self Care | Payer: Medicare Other | Attending: Emergency Medicine | Admitting: Emergency Medicine

## 2016-12-16 DIAGNOSIS — R9431 Abnormal electrocardiogram [ECG] [EKG]: Secondary | ICD-10-CM | POA: Diagnosis not present

## 2016-12-16 DIAGNOSIS — R42 Dizziness and giddiness: Secondary | ICD-10-CM | POA: Insufficient documentation

## 2016-12-16 DIAGNOSIS — Z79899 Other long term (current) drug therapy: Secondary | ICD-10-CM | POA: Insufficient documentation

## 2016-12-16 DIAGNOSIS — I5032 Chronic diastolic (congestive) heart failure: Secondary | ICD-10-CM | POA: Diagnosis not present

## 2016-12-16 DIAGNOSIS — I110 Hypertensive heart disease with heart failure: Secondary | ICD-10-CM | POA: Insufficient documentation

## 2016-12-16 DIAGNOSIS — I2510 Atherosclerotic heart disease of native coronary artery without angina pectoris: Secondary | ICD-10-CM | POA: Insufficient documentation

## 2016-12-16 DIAGNOSIS — Z87891 Personal history of nicotine dependence: Secondary | ICD-10-CM | POA: Diagnosis not present

## 2016-12-16 LAB — BASIC METABOLIC PANEL
ANION GAP: 10 (ref 5–15)
BUN: 16 mg/dL (ref 6–20)
CHLORIDE: 104 mmol/L (ref 101–111)
CO2: 24 mmol/L (ref 22–32)
Calcium: 9.7 mg/dL (ref 8.9–10.3)
Creatinine, Ser: 0.9 mg/dL (ref 0.61–1.24)
GFR calc non Af Amer: >60 mL/min (ref >60)
Glucose, Bld: 105 mg/dL — ABNORMAL HIGH (ref 65–99)
Potassium: 4.5 mmol/L (ref 3.5–5.1)
SODIUM: 138 mmol/L (ref 135–145)

## 2016-12-16 LAB — CBC
HCT: 43.9 % (ref 39.0–52.0)
HEMOGLOBIN: 14.7 g/dL (ref 13.0–17.0)
MCH: 31.5 pg (ref 26.0–34.0)
MCHC: 33.5 g/dL (ref 30.0–36.0)
MCV: 94 fL (ref 78.0–100.0)
PLATELETS: 190 10*3/uL (ref 150–400)
RBC: 4.67 MIL/uL (ref 4.22–5.81)
RDW: 15.8 % — ABNORMAL HIGH (ref 11.5–15.5)
WBC: 5.7 10*3/uL (ref 4.0–10.5)

## 2016-12-16 NOTE — Discharge Instructions (Signed)
Increase your Toprol/metoprolol to 50 mg once per day. You will receive a phone call from Dr. Thompson Caul office regarding when to come in to have monitor fitted.

## 2016-12-16 NOTE — Telephone Encounter (Signed)
New Message     STAT if patient feels like he/she is going to faint   1) Are you dizzy now?  yes  2) Do you feel faint or have you passed out?  Yes he feels faint, but has not passed out   3) Do you have any other symptoms?  no  4) Have you checked your HR and BP (record if available)? No , he has afib

## 2016-12-16 NOTE — ED Provider Notes (Addendum)
[facility] DEPT Provider Note   CSN: [id number] Arrival date & time: 12/16/16  1213     History   Chief Complaint Chief Complaint  Patient presents with  . Dizziness    HPI Jonathan Holden is a 71 y.o. male. Chief complaint is dizziness, lightheadedness  HPI:  71 year old male. 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 This is complicated with atrial fibrillation. He was seen and evaluated by Dr. Pernell Dupre. He continues to follow with Dr. Tamala Julian. He was on amiodarone until the ninth of this month. It was stopped as he had been asymptomatic and maintaining sinus rhythm.  Over the last 2 weeks she's had 3 episodes where he feels lightheaded and near syncopal and orthostatic. He is not subjectively describing palpitations. He was not subjectively aware of his A. fib when he first developed it in February.  This happened today. Resolved while he was en route to hospital. He felt while driving. He ambulated in the emergency room and is asymptomatic now. Has not been bleeding her historically anemic. No history of fluid loss or GI complaints.  Past Medical History:  Diagnosis Date  . Acute blood loss anemia   . Acute lower GI bleeding 03/19/2016  . Arthritis   . Atrial fibrillation with RVR ([place]) 02/21/2016  . Coronary artery disease   . Diverticulosis 04/18/2016  . Endocarditis of native valve   . Former smoker   . H/O gastric ulcer   . History of MI (myocardial infarction) 1992  . Hyperlipidemia   . Hypertension   . 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 Insomnia 04/18/2016  . Left retinal detachment 03/18/2015  . Loose body of right shoulder   . 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 Pseudophakia of left eye 05/22/2013  . Pseudophakia of right eye 06/05/2013  . S/P coronary angioplasty   . Streptococcal bacteremia     Patient Active Problem List   Diagnosis Date Noted  . Diverticulosis 04/18/2016  . Insomnia  04/18/2016  . Arthralgia 04/18/2016  . Muscular deconditioning 04/18/2016  . Acute on chronic diastolic heart failure ([place]) 04/18/2016  . H/O gastric ulcer   . Former smoker   . On amiodarone therapy 04/09/2016  . Contraindication to anticoagulation therapy 03/25/2016  . Gastroesophageal reflux disease   . Acute blood loss anemia   . Endocarditis of native valve   . 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 Streptococcal bacteremia   . Paroxysmal atrial fibrillation with RVR ([name]) 02/21/2016  . 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 Left retinal detachment 03/18/2015  . CAD (coronary artery disease) 09/19/2014  . HTN (hypertension) 09/19/2014  . Hyperlipidemia, unspecified 10/03/2013  . Pseudophakia of right eye 06/05/2013  . Pseudophakia of left eye 05/22/2013    Past Surgical History:  Procedure Laterality Date  . CARDIOVASCULAR STRESS TEST  08/22/2008  . CORONARY ANGIOPLASTY  1992  . ERCP W/ SPHICTEROTOMY  12/15/2002  . LAPAROSCOPIC CHOLECYSTECTOMY  12/16/2002  . LUMBAR LAMINECTOMY     L5  . TEE WITHOUT CARDIOVERSION N/A 02/28/2016   Procedure: TRANSESOPHAGEAL ECHOCARDIOGRAM (TEE);  Surgeon: Fay Records, MD;  Location: [facility] ENDOSCOPY;  Service: Cardiovascular;  Laterality: N/A;       Home Medications    Prior to Admission medications   Medication Sig Start Date End Date Taking? Authorizing Provider  Alirocumab (PRALUENT) 150 MG/ML SOPN Inject 1 pen into the skin every 14 (fourteen) days.   Yes [provider]  amiodarone (  PACERONE) 200 MG tablet Take 200 mg by mouth daily. 10/04/16  Yes [provider]  atorvastatin (LIPITOR) 40 MG tablet Take 1 tablet (40 mg total) by mouth at bedtime. 06/16/16  Yes Belva Crome, MD  metoprolol tartrate (LOPRESSOR) 25 MG tablet Take 1 tablet (25 mg total) by mouth at bedtime. 03/25/16 12/16/16 Yes Belva Crome, MD  Polyvinyl Alcohol (LIQUID TEARS OP) Place 1 drop into both eyes daily as needed (DRY  EYE).   Yes [provider]  Pseudoephedrine-APAP-DM (DAYQUIL PO) Take 2 capsules by mouth daily as needed (COLD SYMPTOMS).   Yes [provider]    Family History Family History  Problem Relation Age of Onset  . Other Mother        AGE 59 HEALTHY  . Heart attack Father 33       2 MI  . Heart disease Maternal Grandfather   . Heart disease Paternal Grandfather   . Emphysema Unknown   . Other Brother        HEALTHY    Social History Social History   Tobacco Use  . Smoking status: Former Smoker    Packs/day: 2.00    Years: 20.00    Pack years: 40.00    Types: Cigarettes    Last attempt to quit: 03/27/1990    Years since quitting: 26.7  . Smokeless tobacco: Never Used  Substance Use Topics  . Alcohol use: Yes    Alcohol/week: 6.0 oz    Types: 10 Glasses of wine per week  . Drug use: No     Allergies   Eliquis [apixaban]; Food; Glutamic acid; Ciprofloxacin; and Pollen extract   Review of Systems Review of Systems  Constitutional: Negative for appetite change, chills, diaphoresis, fatigue and fever.  HENT: Negative for mouth sores, sore throat and trouble swallowing.   Eyes: Negative for visual disturbance.  Respiratory: Negative for cough, chest tightness, shortness of breath and wheezing.   Cardiovascular: Negative for chest pain.  Gastrointestinal: Negative for abdominal distention, abdominal pain, diarrhea, nausea and vomiting.  Endocrine: Negative for polydipsia, polyphagia and polyuria.  Genitourinary: Negative for dysuria, frequency and hematuria.  Musculoskeletal: Negative for gait problem.  Skin: Negative for color change, pallor and rash.  Neurological: Positive for dizziness. Negative for syncope, light-headedness and headaches.  Hematological: Does not bruise/bleed easily.  Psychiatric/Behavioral: Negative for behavioral problems and confusion.     Physical Exam Updated Vital Signs BP (!) 165/94   Pulse 77   Temp 98.4 F (36.9 C)  (Oral)   Resp 10   Ht 6\' 2"  (1.88 m)   Wt 97.5 kg (215 lb)   SpO2 98%   BMI 27.60 kg/m   Physical Exam  Constitutional: He is oriented to person, place, and time. He appears well-developed and well-nourished. No distress.  Healthy-appearing 71 year old male. Awake and alert.  HENT:  Head: Normocephalic.  Conjunctiva not pale. Because membranes are moist.  Eyes: Conjunctivae are normal. Pupils are equal, round, and reactive to light. No scleral icterus.  Neck: Normal range of motion. Neck supple. No thyromegaly present.  Cardiovascular: Normal rate and regular rhythm. Exam reveals no gallop and no friction rub.  No murmur heard. Regular rhythm. Sinus on the monitor. No murmurs. No dependent edema. No JVD.  Pulmonary/Chest: Effort normal and breath sounds normal. No respiratory distress. He has no wheezes. He has no rales.  Abdominal: Soft. Bowel sounds are normal. He exhibits no distension. There is no tenderness. There is no rebound.  Musculoskeletal:  Normal range of motion.  Neurological: He is alert and oriented to person, place, and time.  Skin: Skin is warm and dry. No rash noted.  Psychiatric: He has a normal mood and affect. His behavior is normal.     ED Treatments / Results  Labs (all labs ordered are listed, but only abnormal results are displayed) Labs Reviewed  BASIC METABOLIC PANEL - Abnormal; Notable for the following components:      Result Value   Glucose, Bld 105 (*)    All other components within normal limits  CBC - Abnormal; Notable for the following components:   RDW 15.8 (*)    All other components within normal limits  URINALYSIS, ROUTINE W REFLEX MICROSCOPIC  CBG MONITORING, ED    EKG  EKG Interpretation  Date/Time:  Wednesday December 16 2016 12:35:47 EST Ventricular Rate:  88 PR Interval:    QRS Duration: 107 QT Interval:  381 QTC Calculation: 461 R Axis:   15 Text Interpretation:  Sinus rhythm Borderline T wave abnormalities Confirmed by  Tanna Furry [id number]) on 12/16/2016 5:03:41 PM       Radiology No results found.  Procedures Procedures (including critical care time)  Medications Ordered in ED Medications - No data to display   Initial Impression / Assessment and Plan / ED Course  I have reviewed the triage vital signs and the nursing notes.  Pertinent labs & imaging results that were available during my care of the patient were reviewed by me and considered in my medical decision making (see chart for details).    Sinus rhythm here. Is not anemic. Normal electrolytes. She is not orthostatic to formal testing exam the 20th of symptoms. He states that Dr. Tamala Julian had asked him to be aware of dizziness as he felt this may represent recurrence of his PAF. I discussed the case with Dr. Ellwood Sayers and patient will follow-up at [facility] cardiology for 30 day event monitor. We will increase his Toprol from 25, to 50 to ensure rate control.  Final Clinical Impressions(s) / ED Diagnoses   Final diagnoses:  [place]    ED Discharge Orders    None       Tanna Furry, MD 12/16/16 Junious Dresser    Tanna Furry, MD 12/16/16 [id number]

## 2016-12-16 NOTE — Telephone Encounter (Signed)
Amiodarone d/c'ed at [facility] on 12/07/16.  Pt states he has developed dizziness and has feelings like he going to pass out since this medication was stopped.  Pt currently having these feelings.  Pt currently driving.  Advised pt he needed to pull over because it was not safe for him to be driving with these feelings.  Pt stated he was almost to [facility] and was going to come into office.  Advised pt cant just walk into office.  Pt then informed me that he was about to drive past [place] so he will just stop and go into ER for eval.  Advised pt that I would send message to Dr. Tamala Julian to make him aware.  Pt thanked me for call.

## 2016-12-16 NOTE — ED Triage Notes (Signed)
Patient reports that he was seen in Feb for irregular heart beat and started on Amiodarone. Patient saw his cardiologist in past week and was supposed to be coming off of medications. When patient not taking medications he gets lightheaded and legs fell weak when goes to change positions after being idle for little while.

## 2016-12-17 ENCOUNTER — Telehealth: Payer: Self-pay | Admitting: Interventional Cardiology

## 2016-12-17 DIAGNOSIS — I4891 Unspecified atrial fibrillation: Secondary | ICD-10-CM

## 2016-12-17 NOTE — Telephone Encounter (Signed)
Returned call to patient who states he would like to see Dr. Tamala Julian due to feeling dizzy and lightheaded. Patient was seen in the ED yesterday for the same symptoms. Per ED note patient should follow up for a 30 day event monitor and metoprolol was increased from 25 mg to 50 mg once a day.   I informed patient that he will receive a call from our schedulers for an appointment for event monitor and he has a follow up with Dr. Tamala Julian on 02/04/17. Patient in agreement with plan and thanked me for the call.

## 2016-12-17 NOTE — Telephone Encounter (Signed)
New message    Patient wants to  request an order for holter monitor. Patient seen at [facility]. Patient  Wants next day appt with Dr Tamala Julian. Declined to see APP staff. Please call.

## 2016-12-24 ENCOUNTER — Encounter: Payer: Self-pay | Admitting: Interventional Cardiology

## 2016-12-24 ENCOUNTER — Ambulatory Visit (INDEPENDENT_AMBULATORY_CARE_PROVIDER_SITE_OTHER): Payer: Medicare Other | Admitting: Interventional Cardiology

## 2016-12-24 VITALS — BP 160/84 | HR 68 | Ht 74.0 in | Wt 224.8 lb

## 2016-12-24 DIAGNOSIS — I480 Paroxysmal atrial fibrillation: Secondary | ICD-10-CM | POA: Diagnosis not present

## 2016-12-24 DIAGNOSIS — R42 Dizziness and giddiness: Secondary | ICD-10-CM | POA: Diagnosis not present

## 2016-12-24 DIAGNOSIS — I2510 Atherosclerotic heart disease of native coronary artery without angina pectoris: Secondary | ICD-10-CM

## 2016-12-24 DIAGNOSIS — I10 Essential (primary) hypertension: Secondary | ICD-10-CM | POA: Diagnosis not present

## 2016-12-24 MED ORDER — METOPROLOL TARTRATE 50 MG PO TABS: 50.0000 mg | ORAL_TABLET | Freq: Two times a day (BID) | ORAL | 3 refills | Status: DC

## 2016-12-24 NOTE — Addendum Note (Signed)
Addended by: Loren Racer on: 12/24/2016 05:05 PM   Modules accepted: Orders

## 2016-12-24 NOTE — Telephone Encounter (Signed)
Spoke with Dr. Tamala Julian and he said ok to add pt to schedule today.  Called pt and he states he will come.  Scheduled pt to come in at 2:40pm to see Dr. Tamala Julian. Pt appreciative for call.

## 2016-12-24 NOTE — Progress Notes (Signed)
Cardiology Office Note    Date:  12/24/2016   ID:  Ras, Kollman [birth year], MRN [id number]  PCP:  Briscoe Deutscher, DO  Cardiologist: Sinclair Grooms, MD   Chief Complaint  Patient presents with  . Atrial Fibrillation  . Congestive Heart Failure    History of Present Illness:  Jonathan Holden is a 71 y.o. male  male with a prior history of coronary artery disease, right coronary angioplasty during acute infarction, history of gastric ulcer, recurrent diverticular colonic bleeding episodes on eloquent's, recent diagnosis of atrial fibrillation during acute illness which is persistent, lumbar discitis with streptococcal bacteremia, hyperlipidemia, and hypertension.   Jonathan Holden is complaining of feeling unsteady/dizzy.  This complaint started recently.  He has not had actual syncope.  He denies dyspnea.  There is no headache or difficulty with speech.  He has not had palpitations.  He went to the emergency room with this complaint and was not found to be in atrial fibrillation.  The only significant change found in his history is that we discontinued amiodarone approximately 3 weeks ago.  Also, his blood pressures have been significantly elevated.   Past Medical History:  Diagnosis Date  . Acute blood loss anemia   . Acute lower GI bleeding 03/19/2016  . Arthritis   . Atrial fibrillation with RVR ([place]) 02/21/2016  . Coronary artery disease   . Diverticulosis 04/18/2016  . Endocarditis of native valve   . Former smoker   . H/O gastric ulcer   . History of MI (myocardial infarction) 1992  . Hyperlipidemia   . Hypertension   . Infection of intervertebral disc ([place])   . Insomnia 04/18/2016  . Left retinal detachment 03/18/2015  . Loose body of right shoulder   . 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 Pseudophakia of left eye 05/22/2013  . Pseudophakia of right eye 06/05/2013  . S/P coronary angioplasty   . Streptococcal bacteremia     Past Surgical  History:  Procedure Laterality Date  . CARDIOVASCULAR STRESS TEST  08/22/2008  . CORONARY ANGIOPLASTY  1992  . ERCP W/ SPHICTEROTOMY  12/15/2002  . LAPAROSCOPIC CHOLECYSTECTOMY  12/16/2002  . LUMBAR LAMINECTOMY     L5  . TEE WITHOUT CARDIOVERSION N/A 02/28/2016   Procedure: TRANSESOPHAGEAL ECHOCARDIOGRAM (TEE);  Surgeon: Fay Records, MD;  Location: [facility] ENDOSCOPY;  Service: Cardiovascular;  Laterality: N/A;    Current Medications: Outpatient Medications Prior to Visit  Medication Sig Dispense Refill  . Alirocumab (PRALUENT) 150 MG/ML SOPN Inject 1 pen into the skin every 14 (fourteen) days.    Marland Kitchen amiodarone (PACERONE) 200 MG tablet Take 200 mg by mouth daily.    Marland Kitchen atorvastatin (LIPITOR) 40 MG tablet Take 1 tablet (40 mg total) by mouth at bedtime. 90 tablet 3  . Polyvinyl Alcohol (LIQUID TEARS OP) Place 1 drop into both eyes daily as needed (DRY EYE).    . Pseudoephedrine-APAP-DM (DAYQUIL PO) Take 2 capsules by mouth daily as needed (COLD SYMPTOMS).    . metoprolol tartrate (LOPRESSOR) 25 MG tablet Take 25 mg by mouth daily.   3  . metoprolol tartrate (LOPRESSOR) 25 MG tablet Take 1 tablet (25 mg total) by mouth at bedtime. 90 tablet 3   No facility-administered medications prior to visit.      Allergies:   Eliquis [apixaban]; Food; Glutamic acid; Ciprofloxacin; and Pollen extract   Social History   Socioeconomic History  . Marital status: Divorced    Spouse name: None  .  Number of children: None  . Years of education: None  . Highest education level: None  Social Needs  . Financial resource strain: None  . Food insecurity - worry: None  . Food insecurity - inability: None  . Transportation needs - medical: None  . Transportation needs - non-medical: None  Occupational History  . None  Tobacco Use  . Smoking status: Former Smoker    Packs/day: 2.00    Years: 20.00    Pack years: 40.00    Types: Cigarettes    Last attempt to quit: 03/27/1990    Years since quitting: 26.7    . Smokeless tobacco: Never Used  Substance and Sexual Activity  . Alcohol use: Yes    Alcohol/week: 6.0 oz    Types: 10 Glasses of wine per week  . Drug use: No  . Sexual activity: Not Currently  Other Topics Concern  . None  Social History Narrative  . None     Family History:  The patient's family history includes Emphysema in his unknown relative; Heart attack (age of onset: 39) in his father; Heart disease in his maternal grandfather and paternal grandfather; Other in his brother and mother.   ROS:   Please see the history of present illness.    Other than dizziness he has no complaints.  Described as an unsteady feeling. All other systems reviewed and are negative.   PHYSICAL EXAM:   VS:  BP (!) 160/84   Pulse 68   Ht 6\' 2"  (1.88 m)   Wt 224 lb 12.8 oz (102 kg)   BMI 28.86 kg/m    GEN: Well nourished, well developed, in no acute distress  HEENT: normal  Neck: no JVD, carotid bruits, or masses Cardiac: RRR; no murmurs, rubs, or gallops,no edema  Respiratory:  clear to auscultation bilaterally, normal work of breathing GI: soft, nontender, nondistended, + BS MS: no deformity or atrophy  Skin: warm and dry, no rash Neuro:  Alert and Oriented x 3, Strength and sensation are intact.  No focal deficit is noted. Psych: euthymic mood, full affect  Wt Readings from Last 3 Encounters:  12/24/16 224 lb 12.8 oz (102 kg)  12/16/16 215 lb (97.5 kg)  12/07/16 220 lb 6.4 oz (100 kg)      Studies/Labs Reviewed:   EKG:  EKG sinus rhythm, nonspecific T wave flattening, otherwise no significant abnormality.  Recent Labs: 03/11/2016: TSH 1.148 03/22/2016: Magnesium 1.7 06/23/2016: ALT 16 12/16/2016: BUN 16; Creatinine, Ser 0.90; Hemoglobin 14.7; Platelets 190; Potassium 4.5; Sodium 138   Lipid Panel    Component Value Date/Time   CHOL 90 (L) 05/08/2016 1128   TRIG 128 05/08/2016 1128   HDL 40 05/08/2016 1128   CHOLHDL 2.3 05/08/2016 1128   CHOLHDL 2 10/22/2014 1631   VLDL  22.2 10/22/2014 1631   LDLCALC 24 05/08/2016 1128   LDLDIRECT 33 05/08/2016 1128   LDLDIRECT 143.5 12/06/2012 0909    Additional studies/ records that were reviewed today include:  None.  Reviewed recent emergency room visit.    ASSESSMENT:    1. Essential hypertension   2. Dizzy   3. Paroxysmal atrial fibrillation with RVR (HCC)      PLAN:  In order of problems listed above:  1. Blood pressure is elevated.  Has been using pseudoephedrine.  We have asked him to discontinue Sudafed.  Increase metoprolol to 50 mg twice daily.  Monitor blood pressure.  If it remains elevated at HCTZ 12.5 mg daily 2. Complaint of  dizziness without focal neurological findings.  We will better control his blood pressure. 3. No evidence of atrial fibrillation.  I do believe he should follow through with the recommendation to wear a 30-day monitor.  This will help [place] exclude recurrent A. fib associated with symptoms.  Blood pressure clinic follow-up in 2-3 weeks.  Monitor blood pressure at home.  Give [place] follow-up reports home blood pressure.  Follow with his primary care physician concerning dizziness.    Medication Adjustments/Labs and Tests Ordered: Current medicines are reviewed at length with the patient today.  Concerns regarding medicines are outlined above.  Medication changes, Labs and Tests ordered today are listed in the Patient Instructions below. Patient Instructions  Medication Instructions:  1) INCREASE Metoprolol Tartrate to 50mg  twice daily  Labwork: None  Testing/Procedures: None  Follow-Up: Your physician recommends that you schedule a follow-up appointment as previously scheduled in January with Dr. Tamala Julian.   Any Other Special Instructions Will Be Listed Below (If Applicable).  Monitor your blood pressure two hours after you take your medication.  If it remains high, please contact our office.  If other symptoms, such as dizziness, headache, blurred vision, etc) continue,  please contact your Primary Care Physician for evaluation.    If you need a refill on your cardiac medications before your next appointment, please call your pharmacy.      Signed, Sinclair Grooms, MD  12/24/2016 3:12 PM    [facility] [address], [place], [place]  [address] Phone: [phone]; Fax: [phone]

## 2016-12-24 NOTE — Patient Instructions (Addendum)
Medication Instructions:  1) INCREASE Metoprolol Tartrate to 50mg  twice daily  Labwork: None  Testing/Procedures: None  Follow-Up: Your physician recommends that you schedule a follow-up appointment as previously scheduled in January with Dr. Tamala Julian.   Any Other Special Instructions Will Be Listed Below (If Applicable).  Monitor your blood pressure two hours after you take your medication.  If it remains high, please contact our office.  If other symptoms, such as dizziness, headache, blurred vision, etc) continue, please contact your Primary Care Physician for evaluation.    If you need a refill on your cardiac medications before your next appointment, please call your pharmacy.

## 2016-12-24 NOTE — Telephone Encounter (Signed)
Follow up      Pt c/o BP issue: STAT if pt c/o blurred vision, one-sided weakness or slurred speech  1. What are your last 5 BP readings?  190/109 this morning    2. Are you having any other symptoms (ex. Dizziness, headache, blurred vision, passed out)?  Feeling all these symptoms, feels like he is going to fall over , they come and go he has doubled his medication and it is not helping   3. What is your BP issue?  The last week his bp has been to high, went to the ER last week and they said he was not in AFIB , just that the bp was elevated

## 2016-12-31 DIAGNOSIS — Z23 Encounter for immunization: Secondary | ICD-10-CM | POA: Diagnosis not present

## 2017-01-20 ENCOUNTER — Ambulatory Visit (INDEPENDENT_AMBULATORY_CARE_PROVIDER_SITE_OTHER): Payer: Medicare Other

## 2017-01-20 DIAGNOSIS — I4891 Unspecified atrial fibrillation: Secondary | ICD-10-CM

## 2017-02-04 ENCOUNTER — Encounter: Payer: Self-pay | Admitting: Interventional Cardiology

## 2017-02-04 ENCOUNTER — Ambulatory Visit (INDEPENDENT_AMBULATORY_CARE_PROVIDER_SITE_OTHER): Payer: Medicare Other | Admitting: Interventional Cardiology

## 2017-02-04 VITALS — BP 126/78 | HR 71 | Ht 74.0 in | Wt 230.0 lb

## 2017-02-04 DIAGNOSIS — I480 Paroxysmal atrial fibrillation: Secondary | ICD-10-CM

## 2017-02-04 DIAGNOSIS — E782 Mixed hyperlipidemia: Secondary | ICD-10-CM | POA: Diagnosis not present

## 2017-02-04 DIAGNOSIS — I38 Endocarditis, valve unspecified: Secondary | ICD-10-CM | POA: Diagnosis not present

## 2017-02-04 DIAGNOSIS — I10 Essential (primary) hypertension: Secondary | ICD-10-CM | POA: Diagnosis not present

## 2017-02-04 MED ORDER — LISINOPRIL-HYDROCHLOROTHIAZIDE 10-12.5 MG PO TABS: 1.0000 | ORAL_TABLET | Freq: Every day | ORAL | 3 refills | Status: DC

## 2017-02-04 NOTE — Patient Instructions (Signed)
Medication Instructions:  1) START Lisinopril/HCTZ 10/12.5mg  once daily  Labwork: Your physician recommends that you return for lab work in: 7-14 days (BMET)   Testing/Procedures: None  Follow-Up: Your physician wants you to follow-up in: 4-6 months with Dr. Tamala Julian.  You will receive a reminder letter in the mail two months in advance. If you don't receive a letter, please call our office to schedule the follow-up appointment.   Any Other Special Instructions Will Be Listed Below (If Applicable).     If you need a refill on your cardiac medications before your next appointment, please call your pharmacy.

## 2017-02-04 NOTE — Progress Notes (Signed)
Cardiology Office Note    Date:  02/04/2017   ID:  Jonathan Holden, DOB [birth year], MRN [id number]  PCP:  Briscoe Deutscher, DO  Cardiologist: Sinclair Grooms, MD   Chief Complaint  Patient presents with  . Atrial Fibrillation    History of Present Illness:  Jonathan Holden is a 72 y.o. male male with a prior history of coronary artery disease, right coronary angioplasty during acute infarction, history of gastric ulcer, recurrent diverticular colonic bleeding episodes on eloquent's, recent diagnosis of atrial fibrillation during acute illness which is persistent, lumbar discitis with streptococcal bacteremia, hyperlipidemia, and hypertension.     He is doing okay.  He is wearing a 30-day monitor.  He has had no prolonged palpitations.  Episodes of dizziness and lightheadedness of significance have not recurred.   Past Medical History:  Diagnosis Date  . Acute blood loss anemia   . Acute lower GI bleeding 03/19/2016  . Arthritis   . Atrial fibrillation with RVR ([place]) 02/21/2016  . Coronary artery disease   . Diverticulosis 04/18/2016  . Endocarditis of native valve   . Former smoker   . H/O gastric ulcer   . History of MI (myocardial infarction) 1992  . Hyperlipidemia   . Hypertension   . Infection of intervertebral disc ([place])   . Insomnia 04/18/2016  . Left retinal detachment 03/18/2015  . Loose body of right shoulder   . 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 Pseudophakia of left eye 05/22/2013  . Pseudophakia of right eye 06/05/2013  . S/P coronary angioplasty   . Streptococcal bacteremia     Past Surgical History:  Procedure Laterality Date  . CARDIOVASCULAR STRESS TEST  08/22/2008  . CORONARY ANGIOPLASTY  1992  . ERCP W/ SPHICTEROTOMY  12/15/2002  . LAPAROSCOPIC CHOLECYSTECTOMY  12/16/2002  . LUMBAR LAMINECTOMY     L5  . TEE WITHOUT CARDIOVERSION N/A 02/28/2016   Procedure: TRANSESOPHAGEAL ECHOCARDIOGRAM (TEE);  Surgeon: Fay Records, MD;   Location: [facility] ENDOSCOPY;  Service: Cardiovascular;  Laterality: N/A;    Current Medications: Outpatient Medications Prior to Visit  Medication Sig Dispense Refill  . Alirocumab (PRALUENT) 150 MG/ML SOPN Inject 1 pen into the skin every 14 (fourteen) days.    Marland Kitchen atorvastatin (LIPITOR) 40 MG tablet Take 1 tablet (40 mg total) by mouth at bedtime. 90 tablet 3  . metoprolol tartrate (LOPRESSOR) 50 MG tablet Take 1 tablet (50 mg total) by mouth 2 (two) times daily. 180 tablet 3  . Polyvinyl Alcohol (LIQUID TEARS OP) Place 1 drop into both eyes daily as needed (DRY EYE).    . Pseudoephedrine-APAP-DM (DAYQUIL PO) Take 2 capsules by mouth daily as needed (COLD SYMPTOMS).     No facility-administered medications prior to visit.      Allergies:   Eliquis [apixaban]; Food; Glutamic acid; Ciprofloxacin; and Pollen extract   Social History   Socioeconomic History  . Marital status: Divorced    Spouse name: None  . Number of children: None  . Years of education: None  . Highest education level: None  Social Needs  . Financial resource strain: None  . Food insecurity - worry: None  . Food insecurity - inability: None  . Transportation needs - medical: None  . Transportation needs - non-medical: None  Occupational History  . None  Tobacco Use  . Smoking status: Former Smoker    Packs/day: 2.00    Years: 20.00    Pack years: 40.00    Types:  Cigarettes    Last attempt to quit: 03/27/1990    Years since quitting: 26.8  . Smokeless tobacco: Never Used  Substance and Sexual Activity  . Alcohol use: Yes    Alcohol/week: 6.0 oz    Types: 10 Glasses of wine per week  . Drug use: No  . Sexual activity: Not Currently  Other Topics Concern  . None  Social History Narrative  . None     Family History:  The patient's family history includes Emphysema in his unknown relative; Heart attack (age of onset: 83) in his father; Heart disease in his maternal grandfather and paternal grandfather; Other  in his brother and mother.   ROS:   Please see the history of present illness.    Overall feeling relatively well. All other systems reviewed and are negative.   PHYSICAL EXAM:   VS:  BP 126/78   Pulse 71   Ht 6\' 2"  (1.88 m)   Wt 230 lb (104.3 kg)   BMI 29.53 kg/m    GEN: Well nourished, well developed, in no acute distress  HEENT: normal  Neck: no JVD, carotid bruits, or masses Cardiac: RRR; no murmurs, rubs, or gallops,no edema  Respiratory:  clear to auscultation bilaterally, normal work of breathing GI: soft, nontender, nondistended, + BS MS: no deformity or atrophy  Skin: warm and dry, no rash Neuro:  Alert and Oriented x 3, Strength and sensation are intact Psych: euthymic mood, full affect  Wt Readings from Last 3 Encounters:  02/04/17 230 lb (104.3 kg)  12/24/16 224 lb 12.8 oz (102 kg)  12/16/16 215 lb (97.5 kg)      Studies/Labs Reviewed:   EKG:  EKG not repeated  Recent Labs: 03/11/2016: TSH 1.148 03/22/2016: Magnesium 1.7 06/23/2016: ALT 16 12/16/2016: BUN 16; Creatinine, Ser 0.90; Hemoglobin 14.7; Platelets 190; Potassium 4.5; Sodium 138   Lipid Panel    Component Value Date/Time   CHOL 90 (L) 05/08/2016 1128   TRIG 128 05/08/2016 1128   HDL 40 05/08/2016 1128   CHOLHDL 2.3 05/08/2016 1128   CHOLHDL 2 10/22/2014 1631   VLDL 22.2 10/22/2014 1631   LDLCALC 24 05/08/2016 1128   LDLDIRECT 33 05/08/2016 1128   LDLDIRECT 143.5 12/06/2012 0909    Additional studies/ records that were reviewed today include:  No new data.  30-day continuous monitor is pending    ASSESSMENT:    1. Endocarditis of native valve   2. Paroxysmal atrial fibrillation with RVR ([place])   3. Essential hypertension   4. Mixed hyperlipidemia      PLAN:  In order of problems listed above:  1. No significant regurgitation is heard. 2. No recurrences of amiodarone. 3. Blood pressure is elevated with most recordings greater than 140/90 and at times as high as 295 mmHg  systolic.  Start lisinopril HCT 10/12.5 mg/day.  Basic metabolic panel 1-88 days. 4. Not addressed  Clinical follow-up in 3-6 months.  Remain off amiodarone if we do not find any evidence of atrial fibrillation on.  Blood pressures not well controlled so we are reinstituting ACE/diuretic therapy and will check a basic metabolic panel 4-16 days later.    Medication Adjustments/Labs and Tests Ordered: Current medicines are reviewed at length with the patient today.  Concerns regarding medicines are outlined above.  Medication changes, Labs and Tests ordered today are listed in the Patient Instructions below. Patient Instructions  Medication Instructions:  1) START Lisinopril/HCTZ 10/12.5mg  once daily  Labwork: Your physician recommends that you return for  lab work in: 7-14 days (BMET)   Testing/Procedures: None  Follow-Up: Your physician wants you to follow-up in: 4-6 months with Dr. Tamala Julian.  You will receive a reminder letter in the mail two months in advance. If you don't receive a letter, please call our office to schedule the follow-up appointment.   Any Other Special Instructions Will Be Listed Below (If Applicable).     If you need a refill on your cardiac medications before your next appointment, please call your pharmacy.      Signed, Sinclair Grooms, MD  02/04/2017 11:38 AM    [facility] [address],  [place], [place]  [address] Phone: [phone]; Fax: [phone]

## 2017-02-15 ENCOUNTER — Other Ambulatory Visit: Payer: Medicare Other | Admitting: *Deleted

## 2017-02-15 DIAGNOSIS — I25118 Atherosclerotic heart disease of native coronary artery with other forms of angina pectoris: Secondary | ICD-10-CM

## 2017-02-15 DIAGNOSIS — I10 Essential (primary) hypertension: Secondary | ICD-10-CM | POA: Diagnosis not present

## 2017-02-15 LAB — BASIC METABOLIC PANEL
BUN/Creatinine Ratio: 21 (ref 10–24)
BUN: 26 mg/dL (ref 8–27)
CALCIUM: 9.9 mg/dL (ref 8.6–10.2)
CO2: 24 mmol/L (ref 20–29)
Chloride: 97 mmol/L (ref 96–106)
Creatinine, Ser: 1.25 mg/dL (ref 0.76–1.27)
GFR, EST AFRICAN AMERICAN: 66 mL/min/{1.73_m2} (ref >59)
GFR, EST NON AFRICAN AMERICAN: 57 mL/min/{1.73_m2} — AB (ref >59)
Glucose: 97 mg/dL (ref 65–99)
Potassium: 4.9 mmol/L (ref 3.5–5.2)
Sodium: 138 mmol/L (ref 134–144)

## 2017-02-15 LAB — LIPID PANEL
CHOL/HDL RATIO: 2.7 ratio (ref 0.0–5.0)
Cholesterol, Total: 87 mg/dL — ABNORMAL LOW (ref 100–199)
HDL: 32 mg/dL — AB (ref >39)
LDL Calculated: 31 mg/dL (ref 0–99)
Triglycerides: 120 mg/dL (ref 0–149)
VLDL CHOLESTEROL CAL: 24 mg/dL (ref 5–40)

## 2017-02-15 LAB — HEPATIC FUNCTION PANEL
ALT: 19 IU/L (ref 0–44)
AST: 21 IU/L (ref 0–40)
Albumin: 4.6 g/dL (ref 3.5–4.8)
Alkaline Phosphatase: 86 IU/L (ref 39–117)
Bilirubin Total: 0.6 mg/dL (ref 0.0–1.2)
Bilirubin, Direct: 0.16 mg/dL (ref 0.00–0.40)
TOTAL PROTEIN: 6.9 g/dL (ref 6.0–8.5)

## 2017-02-24 DIAGNOSIS — L578 Other skin changes due to chronic exposure to nonionizing radiation: Secondary | ICD-10-CM | POA: Diagnosis not present

## 2017-02-24 DIAGNOSIS — Z1283 Encounter for screening for malignant neoplasm of skin: Secondary | ICD-10-CM | POA: Diagnosis not present

## 2017-02-24 DIAGNOSIS — D225 Melanocytic nevi of trunk: Secondary | ICD-10-CM | POA: Diagnosis not present

## 2017-02-24 DIAGNOSIS — L821 Other seborrheic keratosis: Secondary | ICD-10-CM | POA: Diagnosis not present

## 2017-02-24 DIAGNOSIS — D1801 Hemangioma of skin and subcutaneous tissue: Secondary | ICD-10-CM | POA: Diagnosis not present

## 2017-02-24 DIAGNOSIS — B351 Tinea unguium: Secondary | ICD-10-CM | POA: Diagnosis not present

## 2017-02-24 DIAGNOSIS — Z85828 Personal history of other malignant neoplasm of skin: Secondary | ICD-10-CM | POA: Diagnosis not present

## 2017-02-24 DIAGNOSIS — L300 Nummular dermatitis: Secondary | ICD-10-CM | POA: Diagnosis not present

## 2017-02-24 DIAGNOSIS — L814 Other melanin hyperpigmentation: Secondary | ICD-10-CM | POA: Diagnosis not present

## 2017-02-24 DIAGNOSIS — L570 Actinic keratosis: Secondary | ICD-10-CM | POA: Diagnosis not present

## 2017-02-24 DIAGNOSIS — D2270 Melanocytic nevi of unspecified lower limb, including hip: Secondary | ICD-10-CM | POA: Diagnosis not present

## 2017-02-24 DIAGNOSIS — L218 Other seborrheic dermatitis: Secondary | ICD-10-CM | POA: Diagnosis not present

## 2017-03-29 ENCOUNTER — Telehealth: Payer: Self-pay | Admitting: Interventional Cardiology

## 2017-03-29 NOTE — Telephone Encounter (Signed)
Patient calling the office for samples of medication:   1.  What medication and dosage are you requesting samples for? Praluent 150 mg  2.  Are you currently out of this medication? Yes  Patient would like to know if he can pick samples up this afternoon.

## 2017-03-29 NOTE — Telephone Encounter (Signed)
Left 2 samples up front for pt. Advised him to bring in Part D Medicare insurance to scan in - Praluent's price is currently dropping 60% and will need to see if copay is now affordable through insurance rather than relying on samples.

## 2017-04-08 ENCOUNTER — Telehealth: Payer: Self-pay | Admitting: Interventional Cardiology

## 2017-04-08 MED ORDER — ALIROCUMAB 150 MG/ML ~~LOC~~ SOPN: 1.0000 "pen " | PEN_INJECTOR | SUBCUTANEOUS | 11 refills | Status: DC

## 2017-04-08 NOTE — Telephone Encounter (Signed)
Rx has been sent to specialty pharmacy, awaiting new Praluent NDCs with lower price to see if pt can start receiving prescriptions from pharmacy rather than relying on samples. Pt is aware.

## 2017-04-08 NOTE — Telephone Encounter (Signed)
F/U   Patient calling states that he received a letter from [facility] that he was approved for coverage for the Praluent.  Patient states that he needs prescription sent to [facility].

## 2017-04-22 ENCOUNTER — Ambulatory Visit: Payer: Medicare Other | Admitting: *Deleted

## 2017-04-30 NOTE — Progress Notes (Deleted)
Subjective:   Jonathan Holden is a 72 y.o. male who presents for Medicare Annual/Subsequent preventive examination.  Review of Systems:  No ROS.  Medicare Wellness Visit. Additional risk factors are reflected in the social history.        Objective:    Vitals: There were no vitals taken for this visit.  There is no height or weight on file to calculate BMI.  Advanced Directives 12/16/2016 04/20/2016 03/17/2016 03/14/2016 03/11/2016 03/10/2016 02/29/2016  Does Patient Have a Medical Advance Directive? No Yes Yes Yes Yes No No  Type of Advance Directive - [place];Living will Living will;Healthcare Power of [place];Living will - -  Does patient want to make changes to medical advance directive? - - Yes (ED - Information included in AVS) - No - Patient declined - -  Copy of [place] in Chart? - No - copy requested Yes - Yes - -  Would patient like information on creating a medical advance directive? - - No - Patient declined - No - Patient declined - No - Patient declined  Pre-existing out of facility DNR order (yellow form or pink MOST form) - - - - - - -    Tobacco Social History   Tobacco Use  Smoking Status Former Smoker  . Packs/day: 2.00  . Years: 20.00  . Pack years: 40.00  . Types: Cigarettes  . Last attempt to quit: 03/27/1990  . Years since quitting: 27.1  Smokeless Tobacco Never Used     Counseling given: Not Answered  Past Medical History:  Diagnosis Date  . Acute blood loss anemia   . Acute lower GI bleeding 03/19/2016  . Arthritis   . Atrial fibrillation with RVR ([place]) 02/21/2016  . Coronary artery disease   . Diverticulosis 04/18/2016  . Endocarditis of native valve   . Former smoker   . H/O gastric ulcer   . History of MI (myocardial infarction) 1992  . Hyperlipidemia   . Hypertension   . Infection of intervertebral disc ([place])   . Insomnia 04/18/2016  . Left  retinal detachment 03/18/2015  . Loose body of right shoulder   . 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 Pseudophakia of left eye 05/22/2013  . Pseudophakia of right eye 06/05/2013  . S/P coronary angioplasty   . Streptococcal bacteremia    Past Surgical History:  Procedure Laterality Date  . CARDIOVASCULAR STRESS TEST  08/22/2008  . CORONARY ANGIOPLASTY  1992  . ERCP W/ SPHICTEROTOMY  12/15/2002  . LAPAROSCOPIC CHOLECYSTECTOMY  12/16/2002  . LUMBAR LAMINECTOMY     L5  . TEE WITHOUT CARDIOVERSION N/A 02/28/2016   Procedure: TRANSESOPHAGEAL ECHOCARDIOGRAM (TEE);  Surgeon: Fay Records, MD;  Location: [facility] ENDOSCOPY;  Service: Cardiovascular;  Laterality: N/A;   Family History  Problem Relation Age of Onset  . Other Mother        AGE 40 HEALTHY  . Heart attack Father 53       2 MI  . Heart disease Maternal Grandfather   . Heart disease Paternal Grandfather   . Emphysema Unknown   . Other Brother        HEALTHY   Social History   Socioeconomic History  . Marital status: Divorced    Spouse name: Not on file  . Number of children: Not on file  . Years of education: Not on file  . Highest education level: Not on file  Occupational  History  . Not on file  Social Needs  . Financial resource strain: Not on file  . Food insecurity:    Worry: Not on file    Inability: Not on file  . Transportation needs:    Medical: Not on file    Non-medical: Not on file  Tobacco Use  . Smoking status: Former Smoker    Packs/day: 2.00    Years: 20.00    Pack years: 40.00    Types: Cigarettes    Last attempt to quit: 03/27/1990    Years since quitting: 27.1  . Smokeless tobacco: Never Used  Substance and Sexual Activity  . Alcohol use: Yes    Alcohol/week: 6.0 oz    Types: 10 Glasses of wine per week  . Drug use: No  . Sexual activity: Not Currently  Lifestyle  . Physical activity:    Days per week: Not on file    Minutes per session: Not on file  . Stress: Not on  file  Relationships  . Social connections:    Talks on phone: Not on file    Gets together: Not on file    Attends religious service: Not on file    Active member of club or organization: Not on file    Attends meetings of clubs or organizations: Not on file    Relationship status: Not on file  Other Topics Concern  . Not on file  Social History Narrative  . Not on file    Outpatient Encounter Medications as of 05/04/2017  Medication Sig  . Alirocumab (PRALUENT) 150 MG/ML SOPN Inject 1 pen into the skin every 14 (fourteen) days.  Marland Kitchen atorvastatin (LIPITOR) 40 MG tablet Take 1 tablet (40 mg total) by mouth at bedtime.  Marland Kitchen lisinopril-hydrochlorothiazide (PRINZIDE,ZESTORETIC) 10-12.5 MG tablet Take 1 tablet by mouth daily.  . metoprolol tartrate (LOPRESSOR) 50 MG tablet Take 1 tablet (50 mg total) by mouth 2 (two) times daily.  . Polyvinyl Alcohol (LIQUID TEARS OP) Place 1 drop into both eyes daily as needed (DRY EYE).  . Pseudoephedrine-APAP-DM (DAYQUIL PO) Take 2 capsules by mouth daily as needed (COLD SYMPTOMS).   No facility-administered encounter medications on file as of 05/04/2017.     Activities of Daily Living No flowsheet data found.  Patient Care Team: Briscoe Deutscher, DO as PCP - General (Family Medicine) Belva Crome, MD as PCP - Cardiology (Cardiology) System, Provider Not In (Oral Surgery) Ashok Pall, MD as Consulting Physician (Neurosurgery) System, Provider Not In (Dentistry) Carlyle Basques, MD as Consulting Physician (Infectious Diseases) Latanya Maudlin, MD as Consulting Physician (Orthopedic Surgery)   Assessment:   This is a routine wellness examination for [facility].  Exercise Activities and Dietary recommendations    Goals    . LDL CALC < 70       Fall Risk Fall Risk  06/23/2016 04/23/2016 04/20/2016 04/13/2016  Falls in the past year? No Yes Yes Yes  Number falls in past yr: - 1 1 1   Injury with Fall? - Yes Yes Yes  Risk Factor Category  - High Fall  Risk High Fall Risk High Fall Risk  Risk for fall due to : - - Impaired balance/gait;Impaired mobility History of fall(s);Impaired balance/gait;Impaired mobility  Follow up - Falls evaluation completed;Education provided;Falls prevention discussed Education provided;Falls prevention discussed -   Depression Screen PHQ 2/9 Scores 06/23/2016 04/23/2016 04/20/2016 04/13/2016  PHQ - 2 Score 0 0 0 0    Cognitive Function        Immunization  History  Administered Date(s) Administered  . Influenza-Unspecified 11/20/2015   Screening Tests Health Maintenance  Topic Date Due  . TETANUS/TDAP  07/31/1963  . PNA vac Low Risk Adult (1 of 2 - PCV13) 07/30/2009  . INFLUENZA VACCINE  08/19/2017  . COLONOSCOPY  04/09/2024  . Hepatitis C Screening  Completed      Plan:   Follow up with PCP as directed.  I have personally reviewed and noted the following in the patient's chart:   . Medical and social history . Use of alcohol, tobacco or illicit drugs  . Current medications and supplements . Functional ability and status . Nutritional status . Physical activity . Advanced directives . List of other physicians . Vitals . Screenings to include cognitive, depression, and falls . Referrals and appointments  In addition, I have reviewed and discussed with patient certain preventive protocols, quality metrics, and best practice recommendations. A written personalized care plan for preventive services as well as general preventive health recommendations were provided to patient.     Williemae Area, RN  04/30/2017

## 2017-04-30 NOTE — Progress Notes (Deleted)
PCP notes:   Health maintenance: Tdap: PCV13:  Abnormal screenings:    Patient concerns:    Nurse concerns:   Next PCP appt: needs scheduled.

## 2017-05-04 ENCOUNTER — Ambulatory Visit: Payer: Medicare Other | Admitting: *Deleted

## 2017-05-11 ENCOUNTER — Telehealth: Payer: Self-pay | Admitting: Interventional Cardiology

## 2017-05-11 ENCOUNTER — Encounter: Payer: Self-pay | Admitting: Interventional Cardiology

## 2017-05-11 NOTE — Telephone Encounter (Signed)
Spoke with Dr. Tamala Julian and he said pt needs to be seen in [facility] ASAP.  Will route message to [facility] to see if pt can be scheduled.

## 2017-05-11 NOTE — Telephone Encounter (Signed)
Pt need to speak to nurse concerning his BP issues and HR 101. Pt is feeling lightheaded and think it is his meds. Please advise

## 2017-05-11 NOTE — Telephone Encounter (Signed)
Pt states over the last couple of weeks his BP has been lower and HR has been higher.  Vitals normally 130's/80's, HR 80's.  Today BP was 119/65, HR 101.  Pt states HR has been in 90's past several days.  Pt c/o lightheadedness and had an episode where he thought he was going to black out.  Pt has not missed any doses of Metoprolol.  No appt availability in [facility] or [facility] office this week.  Will route to Dr. Tamala Julian for review and advisement.    Should we send to [facility] clinic?

## 2017-05-12 ENCOUNTER — Ambulatory Visit (HOSPITAL_COMMUNITY): Payer: Medicare Other | Admitting: Nurse Practitioner

## 2017-05-12 ENCOUNTER — Ambulatory Visit (INDEPENDENT_AMBULATORY_CARE_PROVIDER_SITE_OTHER): Payer: Medicare Other

## 2017-05-12 ENCOUNTER — Ambulatory Visit (HOSPITAL_COMMUNITY)
Admission: RE | Admit: 2017-05-12 | Discharge: 2017-05-12 | Disposition: A | Discharge status: Home / Self Care | Payer: Medicare Other | Source: Ambulatory Visit | Attending: Nurse Practitioner | Admitting: Nurse Practitioner

## 2017-05-12 ENCOUNTER — Encounter (HOSPITAL_COMMUNITY): Payer: Self-pay | Admitting: Nurse Practitioner

## 2017-05-12 VITALS — BP 146/92 | HR 66 | Ht 74.0 in | Wt 209.0 lb

## 2017-05-12 DIAGNOSIS — K5790 Diverticulosis of intestine, part unspecified, without perforation or abscess without bleeding: Secondary | ICD-10-CM | POA: Diagnosis not present

## 2017-05-12 DIAGNOSIS — I10 Essential (primary) hypertension: Secondary | ICD-10-CM | POA: Insufficient documentation

## 2017-05-12 DIAGNOSIS — I252 Old myocardial infarction: Secondary | ICD-10-CM | POA: Insufficient documentation

## 2017-05-12 DIAGNOSIS — Z8719 Personal history of other diseases of the digestive system: Secondary | ICD-10-CM | POA: Insufficient documentation

## 2017-05-12 DIAGNOSIS — G4700 Insomnia, unspecified: Secondary | ICD-10-CM | POA: Diagnosis not present

## 2017-05-12 DIAGNOSIS — R55 Syncope and collapse: Secondary | ICD-10-CM | POA: Diagnosis not present

## 2017-05-12 DIAGNOSIS — Z955 Presence of coronary angioplasty implant and graft: Secondary | ICD-10-CM | POA: Diagnosis not present

## 2017-05-12 DIAGNOSIS — Z8711 Personal history of peptic ulcer disease: Secondary | ICD-10-CM | POA: Insufficient documentation

## 2017-05-12 DIAGNOSIS — I2510 Atherosclerotic heart disease of native coronary artery without angina pectoris: Secondary | ICD-10-CM | POA: Insufficient documentation

## 2017-05-12 DIAGNOSIS — Z87891 Personal history of nicotine dependence: Secondary | ICD-10-CM | POA: Insufficient documentation

## 2017-05-12 DIAGNOSIS — Z79899 Other long term (current) drug therapy: Secondary | ICD-10-CM | POA: Diagnosis not present

## 2017-05-12 DIAGNOSIS — E785 Hyperlipidemia, unspecified: Secondary | ICD-10-CM | POA: Diagnosis not present

## 2017-05-12 DIAGNOSIS — I4891 Unspecified atrial fibrillation: Secondary | ICD-10-CM | POA: Insufficient documentation

## 2017-05-12 LAB — CBC
HCT: 44.9 % (ref 39.0–52.0)
Hemoglobin: 14.9 g/dL (ref 13.0–17.0)
MCH: 30.9 pg (ref 26.0–34.0)
MCHC: 33.2 g/dL (ref 30.0–36.0)
MCV: 93.2 fL (ref 78.0–100.0)
PLATELETS: 232 10*3/uL (ref 150–400)
RBC: 4.82 MIL/uL (ref 4.22–5.81)
RDW: 13.5 % (ref 11.5–15.5)
WBC: 5.9 10*3/uL (ref 4.0–10.5)

## 2017-05-12 LAB — BASIC METABOLIC PANEL
ANION GAP: 7 (ref 5–15)
BUN: 12 mg/dL (ref 6–20)
CO2: 27 mmol/L (ref 22–32)
Calcium: 9.6 mg/dL (ref 8.9–10.3)
Chloride: 101 mmol/L (ref 101–111)
Creatinine, Ser: 1.01 mg/dL (ref 0.61–1.24)
GFR calc Af Amer: >60 mL/min (ref >60)
GLUCOSE: 101 mg/dL — AB (ref 65–99)
POTASSIUM: 4.3 mmol/L (ref 3.5–5.1)
Sodium: 135 mmol/L (ref 135–145)

## 2017-05-12 LAB — MAGNESIUM: MAGNESIUM: 2.2 mg/dL (ref 1.7–2.4)

## 2017-05-12 LAB — TSH: TSH: 0.88 u[IU]/mL (ref 0.350–4.500)

## 2017-05-12 MED ORDER — LISINOPRIL 10 MG PO TABS: 10.0000 mg | ORAL_TABLET | Freq: Every day | ORAL | 3 refills | Status: DC

## 2017-05-12 NOTE — Addendum Note (Signed)
Encounter addended by: Sherran Needs, NP on: 05/12/2017 2:03 PM  Actions taken: Sign clinical note

## 2017-05-12 NOTE — Patient Instructions (Signed)
Stop lisinopril with HCTZ  Start Lisinopril 10mg  once a day

## 2017-05-12 NOTE — Telephone Encounter (Signed)
Pt in the office to get Praluent samples and asked to speak with me.  Pt seen in [facility] clinic today and noted to be in NSR.  Lisinopril-HCTZ was changed to Lisinopril 10mg  QD.  Pt states since he last seen [place] he has only been taking Metoprolol Tartrate 25mg  BID, instead of 50mg  BID.  Pt states about 2-4 hrs after taking Metoprolol in AM he gets lightheaded for a few hrs and then it resolves.  Does not notice at night because he goes to bed not long after he takes his evening dose.  Pt has monitored BP when he becomes lightheaded and it's usually 1teens/60's.  This has been occurring for about 3 wks now.  Advised I would send message to Dr. Tamala Julian for review.

## 2017-05-12 NOTE — Telephone Encounter (Signed)
appt made for 4/24 at 330

## 2017-05-12 NOTE — Progress Notes (Addendum)
Primary Care Physician: Briscoe Deutscher, DO Referring Physician: Dr. Mitchell Heir is a 72 y.o. male with a h/o coronary artery disease, right coronary angioplasty during acute infarction, history of gastric ulcer, recurrent diverticular colonic bleeding episodes on eliquis. 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   He saw Dr. Tamala Julian in December and c/o intermittent dizziness. A 30 day event monitor was worn that did not show any afib or significant bradycardia, NSR. He was also in the ER in November with dizziness, afib was questioned by pt, NSR was found on EKG.   The pt was referred from [facility] triage for syncope to afib clinic which occurred yesterday  He states that he was in his usual state of health driving home and his vision went black. He pulled over and soon as he pulled over, his vision came back. He was not aware of any other symptoms. He drove home which was just a couple miles away. He got home and checked hid BP which was 140/80 with a HR of 101. 10 mins later, it was 88 and a few minutes later, it was 80. He was not aware of any heart irregularity. He has checked  V/S several times since yesterday and HR is always in normal range 60-100. No afib has been documented since early March 2018. He questioned since he saw a HR of 100 bpm yesterday, it might be afib.  He describes intermittent lightheadedness/dizziness for months. Occurs more so with position change from sitting to standing. Once he is up walking, he is alright. EKG today shows SR. Marland Kitchen   Today, he denies symptoms of palpitations, chest pain, shortness of breath, orthopnea, PND, lower extremity edema. + for intermittent dizziness, + brief syncope yesterday. The  patient is tolerating medications without difficulties and is otherwise without complaint today.   Past Medical History:  Diagnosis Date  . Acute blood loss anemia   . Acute lower GI bleeding 03/19/2016  . Arthritis   . Atrial fibrillation with RVR ([place]) 02/21/2016  . Coronary artery disease   . Diverticulosis 04/18/2016  . Endocarditis of native valve   . Former smoker   . H/O gastric ulcer   . History of MI (myocardial infarction) 1992  . Hyperlipidemia   . Hypertension   . Infection of intervertebral disc ([place])   . Insomnia 04/18/2016  . Left retinal detachment 03/18/2015  . Loose body of right shoulder   . 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 Pseudophakia of left eye 05/22/2013  . Pseudophakia of right eye 06/05/2013  . S/P coronary angioplasty   . Streptococcal bacteremia    Past Surgical History:  Procedure Laterality Date  . CARDIOVASCULAR STRESS TEST  08/22/2008  . CORONARY ANGIOPLASTY  1992  . ERCP W/ SPHICTEROTOMY  12/15/2002  . LAPAROSCOPIC CHOLECYSTECTOMY  12/16/2002  . LUMBAR LAMINECTOMY     L5  . TEE WITHOUT CARDIOVERSION N/A 02/28/2016   Procedure: TRANSESOPHAGEAL ECHOCARDIOGRAM (TEE);  Surgeon: Fay Records, MD;  Location: [facility] ENDOSCOPY;  Service: Cardiovascular;  Laterality: N/A;    Current Outpatient Medications  Medication Sig Dispense Refill  . Alirocumab (PRALUENT) 150 MG/ML SOPN Inject 1 pen into the skin every 14 (fourteen) days. 2 pen 11  . metoprolol tartrate (LOPRESSOR)  25 MG tablet Take 25 mg by mouth 2 (two) times daily.    . Polyvinyl Alcohol (LIQUID TEARS OP) Place 1 drop into both eyes daily as needed (DRY EYE).    Marland Kitchen lisinopril (PRINIVIL,ZESTRIL) 10 MG tablet Take 1 tablet (10 mg total) by mouth daily. 30 tablet 3   No current facility-administered medications for this encounter.     Allergies  Allergen Reactions  . Eliquis [Apixaban] Other (See Comments)    Recurrent GI bleeding occurred from diverticulosis in the  setting of anticoagulation therapy for atrial fibrillation.  . 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   Reaction:  Unknown   . Glutamic Acid Other (See Comments)    Pt is allergic to melons. Reaction:Unknown   . Ciprofloxacin Hives  . Pollen Extract Other (See Comments)    Sneezing     Social History   Socioeconomic History  . Marital status: Divorced    Spouse name: Not on file  . Number of children: Not on file  . Years of education: Not on file  . Highest education level: Not on file  Occupational History  . Not on file  Social Needs  . Financial resource strain: Not on file  . Food insecurity:    Worry: Not on file    Inability: Not on file  . Transportation needs:    Medical: Not on file    Non-medical: Not on file  Tobacco Use  . Smoking status: Former Smoker    Packs/day: 2.00    Years: 20.00    Pack years: 40.00    Types: Cigarettes    Last attempt to quit: 03/27/1990    Years since quitting: 27.1  . Smokeless tobacco: Never Used  Substance and Sexual Activity  . Alcohol use: Yes    Alcohol/week: 6.0 oz    Types: 10 Glasses of wine per week  . Drug use: No  . Sexual activity: Not Currently  Lifestyle  . Physical activity:    Days per week: Not on file    Minutes per session: Not on file  . Stress: Not on file  Relationships  . Social connections:    Talks on phone: Not on file    Gets together: Not on file    Attends religious service: Not on file    Active member of club or organization: Not on file    Attends meetings of clubs or organizations: Not on file    Relationship status: Not on file  . Intimate partner violence:    Fear of current or ex partner: Not on file    Emotionally abused: Not on file    Physically abused: Not on file    Forced sexual activity: Not on file  Other Topics Concern  . Not on file  Social History Narrative  . Not on file    Family History  Problem Relation Age of Onset  . Other Mother         AGE 60 HEALTHY  . Heart attack Father 17       2 MI  . Heart disease Maternal Grandfather   . Heart disease Paternal Grandfather   . Emphysema Unknown   . Other Brother        HEALTHY    ROS- All systems are reviewed and negative except as per the HPI above  Physical Exam: Vitals:   05/12/17 1027  BP: (!) 146/92  Pulse: 66  Weight: 209 lb (94.8 kg)  Height: 6\' 2"  (1.88 m)   Wt Readings from Last 3 Encounters:  05/12/17 209 lb (94.8 kg)  02/04/17 230 lb (104.3 kg)  12/24/16 224 lb 12.8 oz (102 kg)    Labs: Lab Results  Component Value Date   NA 135 05/12/2017   K 4.3 05/12/2017   CL 101 05/12/2017   CO2 27 05/12/2017   GLUCOSE 101 (H) 05/12/2017   BUN 12 05/12/2017   CREATININE 1.01 05/12/2017   CALCIUM 9.6 05/12/2017   PHOS 4.1 03/22/2016   MG 2.2 05/12/2017   Lab Results  Component Value Date   INR 1.46 03/17/2016   Lab Results  Component Value Date   CHOL 87 (L) 02/15/2017   HDL 32 (L) 02/15/2017   LDLCALC 31 02/15/2017   TRIG 120 02/15/2017     GEN- The patient is well appearing, alert and oriented x 3 today.   Head- normocephalic, atraumatic Eyes-  Sclera clear, conjunctiva pink Ears- hearing intact Oropharynx- clear Neck- supple, no JVP Lymph- no cervical lymphadenopathy Lungs- Clear to ausculation bilaterally, normal work of breathing Heart- Regular rate and rhythm, no murmurs, rubs or gallops, PMI not laterally displaced GI- soft, NT, ND, + BS Extremities- no clubbing, cyanosis, or edema MS- no significant deformity or atrophy Skin- no rash or lesion Psych- euthymic mood, full affect Neuro- strength and sensation are intact  EKG-Normal sinus rhythm, normal ekg, pr int 172 ms, qrs int 100 ms, qtc 421 ms Echo- 2018-Study Conclusions  - Left ventricle: The cavity size was normal. Wall thickness was   increased in a pattern of mild LVH. The estimated ejection   fraction was 55%. Wall motion was normal; there were no regional   wall  motion abnormalities. Doppler parameters are consistent with   abnormal left ventricular relaxation (grade 1 diastolic   dysfunction). - Aortic valve: There was no stenosis. There was trivial   regurgitation. - Mitral valve: There was no significant regurgitation. - Right ventricle: The cavity size was normal. Systolic function   was normal. - Pulmonary arteries: No complete TR doppler jet so unable to   estimate PA systolic pressure. - Inferior vena cava: The vessel was normal in size. The   respirophasic diameter changes were in the normal range (= 50%),   consistent with normal central venous pressure.  Impressions:  - Normal LV size with mild LV hypertrophy. EF 55%. Normal RV size   and systolic function. No significant valvular abnormalities. Event monitor-Study Highlights - January 2019    Underlying rhythm is sinus rhythm.  No atrial fibrillation is noted.  No excessive bradycardia is noted.        Assessment and Plan: 1. Syncope I am not sure what contributed to his symptoms yesterday, consisting  of  4-5 seconds of LOC I can not r/o afib but sounds like a very atypical presentation for afib Recent event monitor did not show any afib or other arrhythmia's But with syncope, and questioning afib, I feel compelled to place another monitor  He will go  to [facility] street office from here for monitor placement Pt has had recurrent c/o's of dizziness but it sounds more positional  Cbc, bmet,tsh today  Orthostatic v/s with BP drop from 142/96 to 130 /76, HR from 66 to 70. For now will stop hctz and give lisinopril 10 mg only only to see if contributing to dizziness Carotid U/S I have advised him not to drive until further evaluated  F/u with Dr. Tamala Julian as scheduled 5/7  Geroge Baseman Sylus Stgermain, [facility] [address] [place], [place] [address] [phone]

## 2017-05-13 ENCOUNTER — Ambulatory Visit (HOSPITAL_COMMUNITY)
Admission: RE | Admit: 2017-05-13 | Discharge: 2017-05-13 | Disposition: A | Discharge status: Home / Self Care | Payer: Medicare Other | Source: Ambulatory Visit | Attending: Nurse Practitioner | Admitting: Nurse Practitioner

## 2017-05-13 DIAGNOSIS — R55 Syncope and collapse: Secondary | ICD-10-CM | POA: Diagnosis not present

## 2017-05-13 NOTE — Telephone Encounter (Signed)
Spoke with Dr. Tamala Julian yesterday and he said let's leave medications the same for now as taking HCTZ away may help with the spells of lightheadedness.  Called pt and left message to call back.

## 2017-05-13 NOTE — Telephone Encounter (Signed)
Pt walked into office this morning.  Went to have carotid at Afib clinic this AM.  States he got into the car and suddenly had memory issues.  Couldn't remember where he was suppose to go.  Also having issues with lightheadedness.  Pt took Metoprolol Tartrate 25mg  last night but has taken no medication this morning.  Pt states vitals this AM were 130's/79, HR 79.  Vitals here in office were 130/74, HR 70, O2 97%.  Pt showed no classic signs of stroke.  Spoke with Dr. Acie Fredrickson, DOD and he said memory issue not related to heart or his medications.  Dr. Acie Fredrickson recommended pt talk to PCP if memory issue returns.  Advised pt of recommendations. Pt was also made aware of Dr. Thompson Caul recommendations from our conversation yesterday.  Pt verbalized understanding and was appreciative for assistance.

## 2017-05-13 NOTE — Progress Notes (Signed)
Carotid duplex prelim: 1-39% ICA stenosis.  Lashawn Orrego Eunice, RDMS, RVT   

## 2017-05-17 ENCOUNTER — Other Ambulatory Visit (HOSPITAL_COMMUNITY): Payer: Self-pay | Admitting: *Deleted

## 2017-05-17 ENCOUNTER — Telehealth: Payer: Self-pay | Admitting: *Deleted

## 2017-05-17 MED ORDER — AMIODARONE HCL 200 MG PO TABS: ORAL_TABLET | ORAL | 2 refills | Status: DC

## 2017-05-17 NOTE — Telephone Encounter (Signed)
Jonathan Holden discussed plan with Dr. Tamala Julian - will resume Amiodarone at 200mg  BID until follow up with Dr. Tamala Julian on 5/7. Metoprolol will decreased to 12.5mg  BID. Pt verbalized understanding.

## 2017-05-17 NOTE — Telephone Encounter (Signed)
Biotel faxed over an auto-triggered report on this pt from 05/14/17 at 6:26 pm EDT.  Noted that at this time was an auto-triggered report showing that the pt was in a-fib, sinus rhythm, PVCs, PACs, and atrial couplets.  Pts HR was 110 at the time of the event. Spoke with the pt and he reports he was asymptomatic from a cardiac perspective,and has been asymptomatic since event monitor was placed on 05/12/17.  Original ordering Provider was Roderic Palau NP, who saw the pt on 4/24.  Informed the pt that being he is asymptomatic at this time, I will go ahead and fax this report to [facility] clinic, for their review, recommendation, and follow-up with the pt.  Pt verbalized understanding and agrees with this plan. Will route this message to Butch Penny and her RN for their review.

## 2017-05-17 NOTE — Telephone Encounter (Signed)
Unfortunately, Praluent copay is still cost prohibitive at $283 per month even with new [employer]DC pricing. Pt will continue to rely on samples when available in clinic.

## 2017-05-19 ENCOUNTER — Telehealth: Payer: Self-pay | Admitting: Interventional Cardiology

## 2017-05-19 NOTE — Telephone Encounter (Signed)
Spoke with pt and he just wanted clarification on his recent medication changes.  Went over medication dosages and when to take.  Pt verbalized understanding and was appreciative for call.

## 2017-05-19 NOTE — Telephone Encounter (Signed)
New Message:       Pls call regarding changing multiple medications.

## 2017-05-24 NOTE — Progress Notes (Signed)
Cardiology Office Note    Date:  05/24/2017   ID:  [name], Shin 1945-06-01, MRN [id number]  PCP:  Briscoe Deutscher, DO  Cardiologist: Sinclair Grooms, MD   No chief complaint on file.   History of Present Illness:  Jonathan Holden is a 72 y.o. male male with a prior history of coronary artery disease, right coronary angioplasty during acute infarction, history of gastric ulcer, recurrent diverticular colonic bleeding episodes on eloquent's, recent diagnosis of atrial fibrillation during acute illness which is persistent, lumbar discitis with streptococcal bacteremia, hyperlipidemia, and hypertension. Recurrent AF after DC amiodarone and now undergoing resumption of Amio therapy.    The patient continues to have episodes of dizziness.  He has a 30-day monitor which is being worn.  No alerts have been sent from the monitoring service.  The episodes of dizziness have occurred at random, once while driving his car.  He denies chest pain, lower extremity swelling,   Past Medical History:  Diagnosis Date  . Acute blood loss anemia   . Acute lower GI bleeding 03/19/2016  . Arthritis   . Atrial fibrillation with RVR ([place]) 02/21/2016  . Coronary artery disease   . Diverticulosis 04/18/2016  . Endocarditis of native valve   . Former smoker   . H/O gastric ulcer   . History of MI (myocardial infarction) 1992  . Hyperlipidemia   . Hypertension   . Infection of intervertebral disc ([place])   . Insomnia 04/18/2016  . Left retinal detachment 03/18/2015  . Loose body of right shoulder   . 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 Pseudophakia of left eye 05/22/2013  . Pseudophakia of right eye 06/05/2013  . S/P coronary angioplasty   . Streptococcal bacteremia     Past Surgical History:  Procedure Laterality Date  . CARDIOVASCULAR STRESS TEST  08/22/2008  . CORONARY ANGIOPLASTY  1992  . ERCP W/ SPHICTEROTOMY  12/15/2002  . LAPAROSCOPIC CHOLECYSTECTOMY  12/16/2002   . LUMBAR LAMINECTOMY     L5  . TEE WITHOUT CARDIOVERSION N/A 02/28/2016   Procedure: TRANSESOPHAGEAL ECHOCARDIOGRAM (TEE);  Surgeon: Fay Records, MD;  Location: [facility] ENDOSCOPY;  Service: Cardiovascular;  Laterality: N/A;    Current Medications: Outpatient Medications Prior to Visit  Medication Sig Dispense Refill  . Alirocumab (PRALUENT) 150 MG/ML SOPN Inject 1 pen into the skin every 14 (fourteen) days. 2 pen 11  . amiodarone (PACERONE) 200 MG tablet Take 1 tablet by mouth twice a day for 1 month then decrease to 1 tablet daily 60 tablet 2  . lisinopril (PRINIVIL,ZESTRIL) 10 MG tablet Take 1 tablet (10 mg total) by mouth daily. 30 tablet 3  . metoprolol tartrate (LOPRESSOR) 25 MG tablet Take 12.5 mg by mouth 2 (two) times daily.    . Polyvinyl Alcohol (LIQUID TEARS OP) Place 1 drop into both eyes daily as needed (DRY EYE).     No facility-administered medications prior to visit.      Allergies:   Eliquis [apixaban]; Food; Glutamic acid; Ciprofloxacin; and Pollen extract   Social History   Socioeconomic History  . Marital status: Divorced    Spouse name: Not on file  . Number of children: Not on file  . Years of education: Not on file  . Highest education level: Not on file  Occupational History  . Not on file  Social Needs  . Financial resource strain: Not on file  . Food insecurity:    Worry: Not on file  Inability: Not on file  . Transportation needs:    Medical: Not on file    Non-medical: Not on file  Tobacco Use  . Smoking status: Former Smoker    Packs/day: 2.00    Years: 20.00    Pack years: 40.00    Types: Cigarettes    Last attempt to quit: 03/27/1990    Years since quitting: 27.1  . Smokeless tobacco: Never Used  Substance and Sexual Activity  . Alcohol use: Yes    Alcohol/week: 6.0 oz    Types: 10 Glasses of wine per week  . Drug use: No  . Sexual activity: Not Currently  Lifestyle  . Physical activity:    Days per week: Not on file    Minutes per  session: Not on file  . Stress: Not on file  Relationships  . Social connections:    Talks on phone: Not on file    Gets together: Not on file    Attends religious service: Not on file    Active member of club or organization: Not on file    Attends meetings of clubs or organizations: Not on file    Relationship status: Not on file  Other Topics Concern  . Not on file  Social History Narrative  . Not on file     Family History:  The patient's family history includes Emphysema in his unknown relative; Heart attack (age of onset: 72) in his father; Heart disease in his maternal grandfather and paternal grandfather; Other in his brother and mother.   ROS:   Please see the history of present illness.    He feels weak and tired.  Episodes of weakness have been unpredictable.  No back discomfort, angina, or other complaints. All other systems reviewed and are negative.   PHYSICAL EXAM:   VS:  There were no vitals taken for this visit.   GEN: Well nourished, well developed, in no acute distress  HEENT: normal  Neck: no JVD, carotid bruits, or masses Cardiac: RRR; no murmurs, rubs, or gallops,no edema  Respiratory:  clear to auscultation bilaterally, normal work of breathing GI: soft, nontender, nondistended, + BS MS: no deformity or atrophy  Skin: warm and dry, no rash Neuro:  Alert and Oriented x 3, Strength and sensation are intact Psych: euthymic mood, full affect  Wt Readings from Last 3 Encounters:  05/12/17 209 lb (94.8 kg)  02/04/17 230 lb (104.3 kg)  12/24/16 224 lb 12.8 oz (102 kg)      Studies/Labs Reviewed:   EKG:  EKG not repeated.  Most recent tracing was performed on 05/12/2017 and reveals sinus rhythm with PACs.  No evidence of Q wave infarction.  Recent Labs: 02/15/2017: ALT 19 05/12/2017: BUN 12; Creatinine, Ser 1.01; Hemoglobin 14.9; Magnesium 2.2; Platelets 232; Potassium 4.3; Sodium 135; TSH 0.880   Lipid Panel    Component Value Date/Time   CHOL 87  (L) 02/15/2017 1014   TRIG 120 02/15/2017 1014   HDL 32 (L) 02/15/2017 1014   CHOLHDL 2.7 02/15/2017 1014   CHOLHDL 2 10/22/2014 1631   VLDL 22.2 10/22/2014 1631   LDLCALC 31 02/15/2017 1014   LDLDIRECT 33 05/08/2016 1128   LDLDIRECT 143.5 12/06/2012 0909    Additional studies/ records that were reviewed today include:  None    ASSESSMENT:    1. Paroxysmal atrial fibrillation with RVR ([place])   2. On amiodarone therapy   3. Essential hypertension   4. Mixed hyperlipidemia   5. Coronary artery disease  involving native coronary artery of native heart with angina pectoris ([place])      PLAN:  In order of problems listed above:  1. Currently in sinus rhythm based upon clinical exam and reloading with amiodarone. 2. Amiodarone will be chronic therapy.  May need to reassess patient for the possibility of ablation if we have medication toxicity issues.  Hopefully the monitor will demonstrate if recent episodes of dizziness have anything to do with rhythm disturbance.  He is keeping a diary. 3. Excellent blood pressure control without orthostasis. 4. LDL target less than 70.  Not assessed with laboratory data recently. 5. Stable without angina.  Clinical follow-up in 8-12 weeks.  He is cautioned not to drive.  Decrease amiodarone to 200 mg daily in 3 weeks.  TSH, hepatic panel will be performed in 3 months    Medication Adjustments/Labs and Tests Ordered: Current medicines are reviewed at length with the patient today.  Concerns regarding medicines are outlined above.  Medication changes, Labs and Tests ordered today are listed in the Patient Instructions below. There are no Patient Instructions on file for this visit.   Signed, Sinclair Grooms, MD  05/24/2017 8:52 PM    [facility] [place], [place], [place]  [address] Phone: [phone]; Fax: [phone]

## 2017-05-25 ENCOUNTER — Ambulatory Visit (INDEPENDENT_AMBULATORY_CARE_PROVIDER_SITE_OTHER): Payer: Medicare Other | Admitting: Interventional Cardiology

## 2017-05-25 ENCOUNTER — Encounter: Payer: Self-pay | Admitting: Interventional Cardiology

## 2017-05-25 VITALS — BP 116/72 | HR 76 | Ht 74.5 in | Wt 206.6 lb

## 2017-05-25 DIAGNOSIS — Z79899 Other long term (current) drug therapy: Secondary | ICD-10-CM | POA: Diagnosis not present

## 2017-05-25 DIAGNOSIS — I10 Essential (primary) hypertension: Secondary | ICD-10-CM | POA: Diagnosis not present

## 2017-05-25 DIAGNOSIS — I480 Paroxysmal atrial fibrillation: Secondary | ICD-10-CM | POA: Diagnosis not present

## 2017-05-25 DIAGNOSIS — I25119 Atherosclerotic heart disease of native coronary artery with unspecified angina pectoris: Secondary | ICD-10-CM

## 2017-05-25 DIAGNOSIS — E782 Mixed hyperlipidemia: Secondary | ICD-10-CM | POA: Diagnosis not present

## 2017-05-25 NOTE — Patient Instructions (Addendum)
Medication Instructions:  1) In 3 weeks, decrease Amiodarone to 200mg  once daily  Labwork: You will have labs drawn at your next office visit.  Testing/Procedures: None  Follow-Up: Your physician recommends that you schedule a follow-up appointment in: 3 months with Dr. Tamala Julian. (Can have  8/14 or 8/16 at 11:40A)   Any Other Special Instructions Will Be Listed Below (If Applicable).  YOU SHOULD NOT BE DRIVING!   If you need a refill on your cardiac medications before your next appointment, please call your pharmacy.

## 2017-06-08 ENCOUNTER — Encounter: Payer: Self-pay | Admitting: *Deleted

## 2017-06-08 ENCOUNTER — Ambulatory Visit (INDEPENDENT_AMBULATORY_CARE_PROVIDER_SITE_OTHER): Payer: Medicare Other | Admitting: *Deleted

## 2017-06-08 VITALS — BP 140/90 | HR 69 | Resp 16 | Ht 74.0 in | Wt 204.2 lb

## 2017-06-08 DIAGNOSIS — Z0000 Encounter for general adult medical examination without abnormal findings: Secondary | ICD-10-CM | POA: Diagnosis not present

## 2017-06-08 NOTE — Progress Notes (Addendum)
PCP notes:   Health maintenance: Tdap: Educated patient on coverage.  PCV13: Patient declined.  Abnormal screenings: None.   Patient concerns: [place] on Saturday after hitting his head on a bar in a greenhouse.  Patient also states that when he stands up he has to go slowly due to joint pain.  Nurse concerns: None.    Next PCP appt: 06/09/17.

## 2017-06-08 NOTE — Progress Notes (Signed)
Subjective:   Jonathan Holden is a 72 y.o. male who presents for Medicare Annual/Subsequent preventive examination.  Lives in single family one story home. A couple steps, does ok. Eye sight is the biggest barrier.   Review of Systems:  No ROS.  Medicare Wellness Visit. Additional risk factors are reflected in the social history.  Sleeps 6-8 hrs/night. Wakes up feeling rested. Occasionally has back pain when laying in bed due to past infection.   Cardiac Risk Factors include: advanced age (>27men, >58 women);dyslipidemia;hypertension;family history of premature cardiovascular disease;male gender     Objective:    Vitals: BP 140/90 (BP Location: Left Arm, Patient Position: Sitting, Cuff Size: Normal)   Pulse 69   Resp 16   Ht 6\' 2"  (1.88 m)   Wt 204 lb 3.2 oz (92.6 kg)   SpO2 98%   BMI 26.22 kg/m   Body mass index is 26.22 kg/m.  Advanced Directives 06/08/2017 12/16/2016 04/20/2016 03/17/2016 03/14/2016 03/11/2016 03/10/2016  Does Patient Have a Medical Advance Directive? Yes No Yes Yes Yes Yes No  Type of Paramedic of [place];Living will - [place];Living will Living will;Healthcare Power of [place];Living will -  Does patient want to make changes to medical advance directive? No - Patient declined - - Yes (ED - Information included in AVS) - No - Patient declined -  Copy of [place] in Chart? No - copy requested - No - copy requested Yes - Yes -  Would patient like information on creating a medical advance directive? - - - No - Patient declined - No - Patient declined -  Pre-existing out of facility DNR order (yellow form or pink MOST form) - - - - - - -    Tobacco Social History   Tobacco Use  Smoking Status Former Smoker  . Packs/day: 2.00  . Years: 20.00  . Pack years: 40.00  . Types: Cigarettes  . Last attempt to quit: 03/27/1990  . Years since  quitting: 27.2  Smokeless Tobacco Never Used     Counseling given: Not Answered   Past Medical History:  Diagnosis Date  . Acute blood loss anemia   . Acute lower GI bleeding 03/19/2016  . Arthritis   . Atrial fibrillation with RVR ([place]) 02/21/2016  . Coronary artery disease   . Diverticulosis 04/18/2016  . Endocarditis of native valve   . Former smoker   . H/O gastric ulcer   . History of MI (myocardial infarction) 1992  . Hyperlipidemia   . Hypertension   . Infection of intervertebral disc ([place])   . Insomnia 04/18/2016  . Left retinal detachment 03/18/2015  . Loose body of right shoulder   . 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 Pseudophakia of left eye 05/22/2013  . Pseudophakia of right eye 06/05/2013  . S/P coronary angioplasty   . Streptococcal bacteremia    Past Surgical History:  Procedure Laterality Date  . CARDIOVASCULAR STRESS TEST  08/22/2008  . CORONARY ANGIOPLASTY  1992  . ERCP W/ SPHICTEROTOMY  12/15/2002  . LAPAROSCOPIC CHOLECYSTECTOMY  12/16/2002  . LUMBAR LAMINECTOMY     L5  . TEE WITHOUT CARDIOVERSION N/A 02/28/2016   Procedure: TRANSESOPHAGEAL ECHOCARDIOGRAM (TEE);  Surgeon: Fay Records, MD;  Location: [facility] ENDOSCOPY;  Service: Cardiovascular;  Laterality: N/A;   Family History  Problem Relation Age of Onset  . Other Mother  AGE 8 HEALTHY  . Heart attack Father 12       2 MI  . Heart disease Maternal Grandfather   . Heart disease Paternal Grandfather   . Emphysema Unknown   . Other Brother        HEALTHY   Social History   Socioeconomic History  . Marital status: Divorced    Spouse name: Not on file  . Number of children: Not on file  . Years of education: Not on file  . Highest education level: Not on file  Occupational History  . Not on file  Social Needs  . Financial resource strain: Not on file  . Food insecurity:    Worry: Not on file    Inability: Not on file  . Transportation needs:    Medical: Not on  file    Non-medical: Not on file  Tobacco Use  . Smoking status: Former Smoker    Packs/day: 2.00    Years: 20.00    Pack years: 40.00    Types: Cigarettes    Last attempt to quit: 03/27/1990    Years since quitting: 27.2  . Smokeless tobacco: Never Used  Substance and Sexual Activity  . Alcohol use: Not Currently    Comment: Quit drinking this year   . Drug use: No  . Sexual activity: Not Currently  Lifestyle  . Physical activity:    Days per week: Not on file    Minutes per session: Not on file  . Stress: Not on file  Relationships  . Social connections:    Talks on phone: Not on file    Gets together: Not on file    Attends religious service: Not on file    Active member of club or organization: Not on file    Attends meetings of clubs or organizations: Not on file    Relationship status: Not on file  Other Topics Concern  . Not on file  Social History Narrative  . Not on file    Outpatient Encounter Medications as of 06/08/2017  Medication Sig  . Alirocumab (PRALUENT) 150 MG/ML SOPN Inject 1 pen into the skin every 14 (fourteen) days.  Marland Kitchen amiodarone (PACERONE) 200 MG tablet Take 1 tablet by mouth twice a day for 1 month then decrease to 1 tablet daily  . lisinopril (PRINIVIL,ZESTRIL) 10 MG tablet Take 1 tablet (10 mg total) by mouth daily.  . metoprolol tartrate (LOPRESSOR) 25 MG tablet Take 12.5 mg by mouth 2 (two) times daily.  . Polyvinyl Alcohol (LIQUID TEARS OP) Place 1 drop into both eyes daily as needed (DRY EYE).   No facility-administered encounter medications on file as of 06/08/2017.     Activities of Daily Living In your present state of health, do you have any difficulty performing the following activities: 06/08/2017  Hearing? N  Vision? N  Difficulty concentrating or making decisions? N  Walking or climbing stairs? N  Dressing or bathing? N  Doing errands, shopping? N  Preparing Food and eating ? N  Using the Toilet? N  In the past six months, have  you accidently leaked urine? N  Do you have problems with loss of bowel control? N  Managing your Medications? N  Managing your Finances? N  Housekeeping or managing your Housekeeping? N  Some recent data might be hidden    Patient Care Team: Briscoe Deutscher, DO as PCP - General (Family Medicine) Belva Crome, MD as PCP - Cardiology (Cardiology) System, Provider Not In (Oral  Surgery) Ashok Pall, MD as Consulting Physician (Neurosurgery) System, Provider Not In (Dentistry) Carlyle Basques, MD as Consulting Physician (Infectious Diseases) Latanya Maudlin, MD as Consulting Physician (Orthopedic Surgery)   Assessment:   This is a routine wellness examination for [place].  Exercise Activities and Dietary recommendations Current Exercise Habits: The patient does not participate in regular exercise at present, Exercise limited by: None identified  Drinks 4-5 bottled waters/day. Does not eat red meat. Does eat chicken.  Breakfast: yogurt with fruit Lunch: salad Dinner: light snack  Goals    . LDL CALC < 70       Fall Risk Fall Risk  06/08/2017 06/23/2016 04/23/2016 04/20/2016 04/13/2016  Falls in the past year? Yes No Yes Yes Yes  Number falls in past yr: 1 - 1 1 1   Injury with Fall? No - Yes Yes Yes  Risk Factor Category  - - High Fall Risk High Fall Risk High Fall Risk  Risk for fall due to : History of fall(s);Impaired balance/gait - - Impaired balance/gait;Impaired mobility History of fall(s);Impaired balance/gait;Impaired mobility  Follow up Education provided - Falls evaluation completed;Education provided;Falls prevention discussed Education provided;Falls prevention discussed -  Patient states that his balance has been off since his infection. We discussed the balance class at [facility]. He agrees to look into this. I also advised him that we have PT in the office. He states he does better with a class.   Depression Screen PHQ 2/9 Scores 06/08/2017 06/23/2016 04/23/2016 04/20/2016    PHQ - 2 Score 0 0 0 0  PHQ- 9 Score 0 - - -  PHQ9 completed. No signs or symptoms of depression. States that he is coping well from prior infection. Total time spent on topic was 8 minutes.   Cognitive Function        Immunization History  Administered Date(s) Administered  . Influenza-Unspecified 11/20/2015   Screening Tests Health Maintenance  Topic Date Due  . TETANUS/TDAP  06/09/2018 (Originally 07/31/1964)  . PNA vac Low Risk Adult (1 of 2 - PCV13) 06/09/2018 (Originally 07/30/2009)  . INFLUENZA VACCINE  08/19/2017  . COLONOSCOPY  04/09/2024  . Hepatitis C Screening  Completed       Plan:   Follow up with PCP as directed.   I have personally reviewed and noted the following in the patient's chart:   . Medical and social history . Use of alcohol, tobacco or illicit drugs  . Current medications and supplements . Functional ability and status . Nutritional status . Physical activity . Advanced directives . List of other physicians . Vitals . Screenings to include cognitive, depression, and falls . Referrals and appointments  In addition, I have reviewed and discussed with patient certain preventive protocols, quality metrics, and best practice recommendations. A written personalized care plan for preventive services as well as general preventive health recommendations were provided to patient.     Williemae Area, RN  06/08/2017

## 2017-06-08 NOTE — Patient Instructions (Addendum)
Jonathan Holden ,  Bring a copy of your living will and/or healthcare power of attorney to your next office visit.  Thank you for taking time to come for your Medicare Wellness Visit. I appreciate your ongoing commitment to your health goals. Please review the following plan we discussed and let me know if I can assist you in the future.   These are the goals we discussed: Goals    . LDL CALC < 70       This is a list of the screening recommended for you and due dates:  Health Maintenance  Topic Date Due  . Tetanus Vaccine  06/09/2018*  . Pneumonia vaccines (1 of 2 - PCV13) 06/09/2018*  . Flu Shot  08/19/2017  . Colon Cancer Screening  04/09/2024  .  Hepatitis C: One time screening is recommended by Center for Disease Control  (CDC) for  adults born from [birth year] through 1966.   Completed  *Topic was postponed. The date shown is not the original due date.   Preventive Care for Adults  A healthy lifestyle and preventive care can promote health and wellness. Preventive health guidelines for adults include the following key practices.  . A routine yearly physical is a good way to check with your health care provider about your health and preventive screening. It is a chance to share any concerns and updates on your health and to receive a thorough exam.  . Visit your dentist for a routine exam and preventive care every 6 months. Brush your teeth twice a day and floss once a day. Good oral hygiene prevents tooth decay and gum disease.  . The frequency of eye exams is based on your age, health, family medical history, use  of contact lenses, and other factors. Follow your health care provider's recommendations for frequency of eye exams.  . Eat a healthy diet. Foods like vegetables, fruits, whole grains, low-fat dairy products, and lean protein foods contain the nutrients you need without too many calories. Decrease your intake of foods high in solid fats, added sugars, and salt. Eat the right  amount of calories for you. Get information about a proper diet from your health care provider, if necessary.  . Regular physical exercise is one of the most important things you can do for your health. Most adults should get at least 150 minutes of moderate-intensity exercise (any activity that increases your heart rate and causes you to sweat) each week. In addition, most adults need muscle-strengthening exercises on 2 or more days a week.  Silver Sneakers may be a benefit available to you. To determine eligibility, you may visit the website: www.silversneakers.com or contact program at (908)158-7022 Mon-Fri between 8AM-8PM.   . Maintain a healthy weight. The body mass index (BMI) is a screening tool to identify possible weight problems. It provides an estimate of body fat based on height and weight. Your health care provider can find your BMI and can help you achieve or maintain a healthy weight.   For adults 20 years and older: ? A BMI below 18.5 is considered underweight. ? A BMI of 18.5 to 24.9 is normal. ? A BMI of 25 to 29.9 is considered overweight. ? A BMI of 30 and above is considered obese.   . Maintain normal blood lipids and cholesterol levels by exercising and minimizing your intake of saturated fat. Eat a balanced diet with plenty of fruit and vegetables. Blood tests for lipids and cholesterol should begin at age 3 and be  repeated every 5 years. If your lipid or cholesterol levels are high, you are over 50, or you are at high risk for heart disease, you may need your cholesterol levels checked more frequently. Ongoing high lipid and cholesterol levels should be treated with medicines if diet and exercise are not working.  . If you smoke, find out from your health care provider how to quit. If you do not use tobacco, please do not start.  . If you choose to drink alcohol, please do not consume more than 2 drinks per day. One drink is considered to be 12 ounces (355 mL) of beer, 5  ounces (148 mL) of wine, or 1.5 ounces (44 mL) of liquor.  . If you are 41-61 years old, ask your health care provider if you should take aspirin to prevent strokes.  . Use sunscreen. Apply sunscreen liberally and repeatedly throughout the day. You should seek shade when your shadow is shorter than you. Protect yourself by wearing long sleeves, pants, a wide-brimmed hat, and sunglasses year round, whenever you are outdoors.  . Once a month, do a whole body skin exam, using a mirror to look at the skin on your back. Tell your health care provider of new moles, moles that have irregular borders, moles that are larger than a pencil eraser, or moles that have changed in shape or color.

## 2017-06-08 NOTE — Progress Notes (Signed)
I have personally reviewed the Medicare Annual Wellness questionnaire and have noted 1. The patient's medical and social history 2. Their use of alcohol, tobacco or illicit drugs 3. Their current medications and supplements 4. The patient's functional ability including ADL's, fall risks, home safety risks and hearing or visual impairment. 5. Diet and physical activities 6. Evidence for depression or mood disorders 7. Reviewed Updated provider list, see scanned forms and [facility] Snapshot.   The patients weight, height, BMI and visual acuity have been recorded in the chart I have made referrals, counseling and provided education to the patient based review of the above and I have provided the pt with a written personalized care plan for preventive services.  I have provided the patient with a copy of your personalized plan for preventive services. Instructed to take the time to review along with their updated medication list.  Daniela Siebers DO 

## 2017-06-09 ENCOUNTER — Ambulatory Visit (INDEPENDENT_AMBULATORY_CARE_PROVIDER_SITE_OTHER): Payer: Medicare Other | Admitting: Family Medicine

## 2017-06-09 ENCOUNTER — Ambulatory Visit (INDEPENDENT_AMBULATORY_CARE_PROVIDER_SITE_OTHER)
Admission: RE | Admit: 2017-06-09 | Discharge: 2017-06-09 | Disposition: A | Discharge status: Home / Self Care | Payer: Medicare Other | Source: Ambulatory Visit | Attending: Family Medicine | Admitting: Family Medicine

## 2017-06-09 ENCOUNTER — Encounter: Payer: Self-pay | Admitting: Family Medicine

## 2017-06-09 DIAGNOSIS — G44311 Acute post-traumatic headache, intractable: Secondary | ICD-10-CM | POA: Diagnosis not present

## 2017-06-09 DIAGNOSIS — I25119 Atherosclerotic heart disease of native coronary artery with unspecified angina pectoris: Secondary | ICD-10-CM

## 2017-06-09 DIAGNOSIS — G44319 Acute post-traumatic headache, not intractable: Secondary | ICD-10-CM | POA: Diagnosis not present

## 2017-06-09 DIAGNOSIS — R51 Headache: Secondary | ICD-10-CM | POA: Diagnosis not present

## 2017-06-09 DIAGNOSIS — S0990XA Unspecified injury of head, initial encounter: Secondary | ICD-10-CM | POA: Diagnosis not present
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
## 2017-06-10 ENCOUNTER — Telehealth: Payer: Self-pay | Admitting: Interventional Cardiology

## 2017-06-10 NOTE — Telephone Encounter (Signed)
New Message   Patient calling the office for samples of medication:   1.  What medication and dosage are you requesting samples for? Alirocumab (PRALUENT) 150 MG/ML SOPN  2.  Are you currently out of this medication? yes

## 2017-06-11 NOTE — Telephone Encounter (Signed)
Pt aware to come get samples.

## 2017-06-16 ENCOUNTER — Telehealth: Payer: Self-pay | Admitting: Interventional Cardiology

## 2017-06-16 NOTE — Telephone Encounter (Signed)
Spoke with pt and he said he has been wearing a month.  Advised ok to take off and send it back in. Pt appreciative for call.

## 2017-06-16 NOTE — Telephone Encounter (Signed)
New Message   Pt calling, wanting to know when can he discontinue wearing his heart monitor. Please call

## 2017-06-16 NOTE — Telephone Encounter (Signed)
Order for monitor came from [facility].  Will route to [facility] RN to f/u.

## 2017-07-12 ENCOUNTER — Telehealth: Payer: Self-pay | Admitting: Pharmacist

## 2017-07-12 ENCOUNTER — Telehealth: Payer: Self-pay | Admitting: Interventional Cardiology

## 2017-07-12 MED ORDER — METOPROLOL SUCCINATE ER 25 MG PO TB24: 12.5000 mg | ORAL_TABLET | Freq: Every day | ORAL | 2 refills | Status: DC

## 2017-07-12 NOTE — Telephone Encounter (Signed)
Pt calling   Pt want to speak to you concerning his mediations. Pt wouldn't give me any details to the call. Pt was very blunt with the call. Please call pt

## 2017-07-12 NOTE — Telephone Encounter (Signed)
Spoke with Dr. Tamala Julian and he said switch Metoprolol to Metoprolol Succ 12.5mg  QAM and have pt call if he feels bad on this medication.  Continue Amio in PM.  Spoke with pt and advised him of Dr. Thompson Caul recommendations.  Pt asked that prescription be sent to [facility] in [place]. Pt will call and let me know if he does not feel well on new medication.

## 2017-07-12 NOTE — Telephone Encounter (Signed)
Pt calling to ask questions about medications.  Pt was started on Amiodarone 200mg  BID x 1 month at the end of April.  Was told after 1 month to decrease to 200mg  QD.  While on 200mg  BID pt only took Metoprolol Tartrate 12.5mg  once daily and still felt bad.  Pt felt he was getting too much medication.  For about 2 weeks now pt has not taken any Metoprolol and states he feels better.  BP over the last 2 weeks has been 115-130/80's (occasionally in the 70's), HR is normally 70's-80's.  Advised pt I would send message to Dr. Tamala Julian for review and advisement.

## 2017-07-12 NOTE — Telephone Encounter (Signed)
Disregard - encounter opened in error

## 2017-07-12 NOTE — Telephone Encounter (Signed)
Pt will come get samples tomorrow.

## 2017-07-12 NOTE — Telephone Encounter (Signed)
New Message    Patient calling the office for samples of medication:   1.  What medication and dosage are you requesting samples for? Praulent  2.  Are you currently out of this medication? Two days.

## 2017-08-13 ENCOUNTER — Telehealth: Payer: Self-pay | Admitting: Interventional Cardiology

## 2017-08-13 NOTE — Telephone Encounter (Signed)
New message    Patient calling the office for samples of medication:   1.  What medication and dosage are you requesting samples for? Alirocumab (PRALUENT) 150 MG/ML SOPN  2.  Are you currently out of this medication? yes

## 2017-08-13 NOTE — Telephone Encounter (Signed)
Will leave samples up front, pt aware.

## 2017-08-31 ENCOUNTER — Telehealth: Payer: Self-pay | Admitting: Interventional Cardiology

## 2017-08-31 DIAGNOSIS — E782 Mixed hyperlipidemia: Secondary | ICD-10-CM

## 2017-08-31 NOTE — Telephone Encounter (Signed)
Spoke with patient and confirmed that he will have fasting labs tomorrow after his Dr. Tamala Julian appointment. Sending to Dr. Thompson Caul nurse for reference.

## 2017-08-31 NOTE — Telephone Encounter (Signed)
New Message:      Pt is calling to see if he needs to have fasting labs on tomorrow.

## 2017-08-31 NOTE — Telephone Encounter (Addendum)
Patient is asking if fasting labs are needed for their appointment with Dr. Tamala Julian tomorrow. Per Dr. Tamala Julian note, they are going to have a hepatic function test and a TSH. They are also on praluent. The last lipid was about 6 months ago. Sending to Pharmacy for recommendation.

## 2017-08-31 NOTE — Progress Notes (Signed)
Cardiology Office Note:    Date:  09/01/2017   ID:  Jonathan Holden, DOB 05-27-1945, MRN [id number]  PCP:  Briscoe Deutscher, DO  Cardiologist:  Sinclair Grooms, MD   Referring MD: Briscoe Deutscher, DO   Chief Complaint  Patient presents with  . Dizziness    History of Present Illness:    Jonathan Holden is a 72 y.o. male with a hx of prior history of coronary artery disease, right coronary angioplasty during acute infarction, history of gastric ulcer, recurrent diverticular colonic bleeding episodes on eloquent's, recent diagnosis of atrial fibrillation during acute illness which is persistent, lumbar discitis with streptococcal bacteremia, hyperlipidemia, and hypertension.Recurrent AF after DC amiodarone and now undergoing resumption of Amio therapy.  He still experiences episodes of dizziness.  These episodes are characterized as a spinning/motion type dizziness.  He is more apt to have it while driving.  It occasionally happens with changes in posture or sudden direction change.  He has not felt significant palpitations.  He is frequently measure blood pressure and heart rate during episodes and is not noted an increase in heart rate or drop in blood pressure.  He feels it may be medication related.  Past Medical History:  Diagnosis Date  . Acute blood loss anemia   . Acute lower GI bleeding 03/19/2016  . Arthritis   . Atrial fibrillation with RVR ([place]) 02/21/2016  . Coronary artery disease   . Diverticulosis 04/18/2016  . Endocarditis of native valve   . Former smoker   . H/O gastric ulcer   . History of MI (myocardial infarction) 1992  . Hyperlipidemia   . Hypertension   . Infection of intervertebral disc ([place])   . Insomnia 04/18/2016  . Left retinal detachment 03/18/2015  . Loose body of right shoulder   . 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 Pseudophakia of left eye 05/22/2013  . Pseudophakia of right eye 06/05/2013  . S/P coronary angioplasty   .  Streptococcal bacteremia     Past Surgical History:  Procedure Laterality Date  . CARDIOVASCULAR STRESS TEST  08/22/2008  . CORONARY ANGIOPLASTY  1992  . ERCP W/ SPHICTEROTOMY  12/15/2002  . LAPAROSCOPIC CHOLECYSTECTOMY  12/16/2002  . LUMBAR LAMINECTOMY     L5  . TEE WITHOUT CARDIOVERSION N/A 02/28/2016   Procedure: TRANSESOPHAGEAL ECHOCARDIOGRAM (TEE);  Surgeon: Fay Records, MD;  Location: [facility] ENDOSCOPY;  Service: Cardiovascular;  Laterality: N/A;    Current Medications: Current Meds  Medication Sig  . Alirocumab (PRALUENT) 150 MG/ML SOPN Inject 1 pen into the skin every 14 (fourteen) days.  Marland Kitchen amiodarone (PACERONE) 200 MG tablet Take 1 tablet by mouth twice a day for 1 month then decrease to 1 tablet daily  . lisinopril (PRINIVIL,ZESTRIL) 10 MG tablet Take 1 tablet (10 mg total) by mouth daily.  . metoprolol succinate (TOPROL-XL) 25 MG 24 hr tablet Take 0.5 tablets (12.5 mg total) by mouth daily.  . Polyvinyl Alcohol (LIQUID TEARS OP) Place 1 drop into both eyes daily as needed (DRY EYE).     Allergies:   Eliquis [apixaban]; Food; Glutamic acid; Ciprofloxacin; and Pollen extract   Social History   Socioeconomic History  . Marital status: Divorced    Spouse name: Not on file  . Number of children: Not on file  . Years of education: Not on file  . Highest education level: Not on file  Occupational History  . Not on file  Social Needs  . Financial resource strain: Not  on file  . Food insecurity:    Worry: Not on file    Inability: Not on file  . Transportation needs:    Medical: Not on file    Non-medical: Not on file  Tobacco Use  . Smoking status: Former Smoker    Packs/day: 2.00    Years: 20.00    Pack years: 40.00    Types: Cigarettes    Last attempt to quit: 03/27/1990    Years since quitting: 27.4  . Smokeless tobacco: Never Used  Substance and Sexual Activity  . Alcohol use: Not Currently    Comment: Quit drinking this year   . Drug use: No  . Sexual  activity: Not Currently  Lifestyle  . Physical activity:    Days per week: Not on file    Minutes per session: Not on file  . Stress: Not on file  Relationships  . Social connections:    Talks on phone: Not on file    Gets together: Not on file    Attends religious service: Not on file    Active member of club or organization: Not on file    Attends meetings of clubs or organizations: Not on file    Relationship status: Not on file  Other Topics Concern  . Not on file  Social History Narrative  . Not on file     Family History: The patient's family history includes Emphysema in his unknown relative; Heart attack (age of onset: 66) in his father; Heart disease in his maternal grandfather and paternal grandfather; Other in his brother and mother.  ROS:   Please see the history of present illness.    Depression and anxiety concerning medical illness all other systems reviewed and are negative.  EKGs/Labs/Other Studies Reviewed:    The following studies were reviewed today: No new data  EKG:  EKG is not ordered today.   Recent Labs: 02/15/2017: ALT 19 05/12/2017: BUN 12; Creatinine, Ser 1.01; Hemoglobin 14.9; Magnesium 2.2; Platelets 232; Potassium 4.3; Sodium 135; TSH 0.880  Recent Lipid Panel    Component Value Date/Time   CHOL 87 (L) 02/15/2017 1014   TRIG 120 02/15/2017 1014   HDL 32 (L) 02/15/2017 1014   CHOLHDL 2.7 02/15/2017 1014   CHOLHDL 2 10/22/2014 1631   VLDL 22.2 10/22/2014 1631   LDLCALC 31 02/15/2017 1014   LDLDIRECT 33 05/08/2016 1128   LDLDIRECT 143.5 12/06/2012 0909    Physical Exam:    VS:  BP 134/72   Pulse 75   Ht 6\' 3"  (1.905 m)   Wt 204 lb 12.8 oz (92.9 kg)   BMI 25.60 kg/m     Wt Readings from Last 3 Encounters:  09/01/17 204 lb 12.8 oz (92.9 kg)  06/09/17 204 lb 12.8 oz (92.9 kg)  06/08/17 204 lb 3.2 oz (92.6 kg)     GEN: Somewhat frail-appearing but well nourished, well developed in no acute distress HEENT: Normal NECK: No  JVD. LYMPHATICS: No lymphadenopathy CARDIAC: RRR, no murmur, no gallop, no  edema. VASCULAR: 2+ bilateral radial pulses.  No bruits. RESPIRATORY:  Clear to auscultation without rales, wheezing or rhonchi  ABDOMEN: Soft, non-tender, non-distended, No pulsatile mass, MUSCULOSKELETAL: No deformity  SKIN: Warm and dry NEUROLOGIC:  Alert and oriented x 3 PSYCHIATRIC:  Normal affect   ASSESSMENT:    1. Paroxysmal atrial fibrillation with RVR (HCC)   2. Vertigo   3. Coronary artery disease of native artery of native heart with stable angina pectoris ([place])  4. On amiodarone therapy   5. Other hyperlipidemia   6. Chronic diastolic HF (heart failure) ([place])   7. Mixed hyperlipidemia    PLAN:    In order of problems listed above:  1. Currently maintaining normal sinus rhythm.  Plan is to maintain amiodarone therapy at current dose. 2. Antivert 12.5 mg each evening.  Can optionally also take a dose first thing each morning.  If dizziness improves no further changes.  If symptoms continue will need to be referred back to primary care or ENT for evaluation. 3. No recurrence of anginal symptoms following remote stenting performed years ago 4. Continue amiodarone.  TSH and liver panel will be done on next office visit. 5. LDL target less than 70. 6. No evidence of volume overload. 7. LDL target less than 70.  Clinical observation.  Consider discontinuation of beta-blocker therapy.  Decrease amiodarone 100 mg/day if dizziness persists despite efforts to manage.  Medication Adjustments/Labs and Tests Ordered: Current medicines are reviewed at length with the patient today.  Concerns regarding medicines are outlined above.  No orders of the defined types were placed in this encounter.  No orders of the defined types were placed in this encounter.   Patient Instructions  Medication Instructions:  1) You may take Antivery/Meclizine 12.5mg  once daily at night to see if it helps with dizziness.   You may also take an additional dose during the day for dizziness.  Labwork: None  Testing/Procedures: None  Follow-Up: Your physician recommends that you schedule a follow-up appointment in: 4 months with Dr. Tamala Julian.   Any Other Special Instructions Will Be Listed Below (If Applicable).  Please contact your Primary Care Physician to discuss dizziness/vertigo.   If no improvement of dizziness with new medication, please contact the office so we can change your Metoprolol and Amiodarone.   If you need a refill on your cardiac medications before your next appointment, please call your pharmacy.      Signed, Sinclair Grooms, MD  09/01/2017 6:43 PM    [place]

## 2017-08-31 NOTE — Telephone Encounter (Signed)
Pt lipids are stable. We will want annual labs for Praluent renewal through insurance (thus it would be reasonable to get these labs with other lab orders). Lab order has been entered.

## 2017-09-01 ENCOUNTER — Encounter: Payer: Self-pay | Admitting: Interventional Cardiology

## 2017-09-01 ENCOUNTER — Ambulatory Visit (INDEPENDENT_AMBULATORY_CARE_PROVIDER_SITE_OTHER): Payer: Medicare Other | Admitting: Interventional Cardiology

## 2017-09-01 ENCOUNTER — Other Ambulatory Visit: Payer: Medicare Other

## 2017-09-01 VITALS — BP 134/72 | HR 75 | Ht 75.0 in | Wt 204.8 lb

## 2017-09-01 DIAGNOSIS — Z79899 Other long term (current) drug therapy: Secondary | ICD-10-CM | POA: Diagnosis not present

## 2017-09-01 DIAGNOSIS — R42 Dizziness and giddiness: Secondary | ICD-10-CM

## 2017-09-01 DIAGNOSIS — I25119 Atherosclerotic heart disease of native coronary artery with unspecified angina pectoris: Secondary | ICD-10-CM

## 2017-09-01 DIAGNOSIS — I5032 Chronic diastolic (congestive) heart failure: Secondary | ICD-10-CM

## 2017-09-01 DIAGNOSIS — E7849 Other hyperlipidemia: Secondary | ICD-10-CM | POA: Diagnosis not present

## 2017-09-01 DIAGNOSIS — E782 Mixed hyperlipidemia: Secondary | ICD-10-CM

## 2017-09-01 DIAGNOSIS — I25118 Atherosclerotic heart disease of native coronary artery with other forms of angina pectoris: Secondary | ICD-10-CM

## 2017-09-01 DIAGNOSIS — I480 Paroxysmal atrial fibrillation: Secondary | ICD-10-CM | POA: Diagnosis not present

## 2017-09-01 NOTE — Patient Instructions (Signed)
Medication Instructions:  1) You may take Antivery/Meclizine 12.5mg  once daily at night to see if it helps with dizziness.  You may also take an additional dose during the day for dizziness.  Labwork: None  Testing/Procedures: None  Follow-Up: Your physician recommends that you schedule a follow-up appointment in: 4 months with Dr. Tamala Julian.   Any Other Special Instructions Will Be Listed Below (If Applicable).  Please contact your Primary Care Physician to discuss dizziness/vertigo.   If no improvement of dizziness with new medication, please contact the office so we can change your Metoprolol and Amiodarone.   If you need a refill on your cardiac medications before your next appointment, please call your pharmacy.

## 2017-09-02 LAB — LIPID PANEL
CHOL/HDL RATIO: 4.4 ratio (ref 0.0–5.0)
CHOLESTEROL TOTAL: 153 mg/dL (ref 100–199)
HDL: 35 mg/dL — ABNORMAL LOW (ref >39)
LDL Calculated: 76 mg/dL (ref 0–99)
Triglycerides: 210 mg/dL — ABNORMAL HIGH (ref 0–149)
VLDL CHOLESTEROL CAL: 42 mg/dL — AB (ref 5–40)

## 2017-11-25 ENCOUNTER — Other Ambulatory Visit: Payer: Self-pay | Admitting: Interventional Cardiology

## 2017-11-29 ENCOUNTER — Other Ambulatory Visit (HOSPITAL_COMMUNITY): Payer: Self-pay | Admitting: Nurse Practitioner

## 2017-12-09 NOTE — Progress Notes (Signed)
Jonathan Holden is a 72 y.o. male is here for follow up.  History of Present Illness:   I,Jonathan Holden, industrial/product as a Education administrator for Jonathan Deutscher, DO,have documented all relevant documentation on the behalf of Jonathan Deutscher, DO,as directed by  Jonathan Deutscher, DO while in the presence of Jonathan Deutscher, DO.  HPI: Patient in office for follow up for:   Paroxysmal atrial fibrillation with RVR ([place]) Followed by cardiology. Last appointment 09/01/2017. Notes reviewed.   Mixed hyperlipidemia  Is the patient taking medications without problems? No. Does the patient complain of muscle aches?  No. Trying to exercise on a regular basis? Yes. Compliant with diet? Yes.  Lab Results  Component Value Date   CHOL 153 09/01/2017   HDL 35 (L) 09/01/2017   LDLCALC 76 09/01/2017   LDLDIRECT 33 05/08/2016   TRIG 210 (H) 09/01/2017   CHOLHDL 4.4 09/01/2017   Lab Results  Component Value Date   ALT 19 02/15/2017   AST 21 02/15/2017   ALKPHOS 86 02/15/2017   BILITOT 0.6 02/15/2017     Review of Systems  Constitutional: Negative for chills, fever, malaise/fatigue and weight loss.  Respiratory: Negative for cough, shortness of breath and wheezing.   Cardiovascular: Negative for chest pain, palpitations and leg swelling.  Gastrointestinal: Negative for abdominal pain, constipation, diarrhea, nausea and vomiting.  Genitourinary: Negative for dysuria and urgency.  Musculoskeletal: Negative for joint pain and myalgias.  Skin: Negative for rash.  Neurological: Negative for dizziness and headaches.  Psychiatric/Behavioral: Negative for depression, substance abuse and suicidal ideas. The patient is not nervous/anxious.    Health Maintenance Due  Topic Date Due  . INFLUENZA VACCINE  08/19/2017   Depression screen [facility] 2/9 12/10/2017 06/08/2017 06/23/2016  Decreased Interest 0 0 0  Down, Depressed, Hopeless 0 0 0  PHQ - 2 Score 0 0 0  Altered sleeping 0 0 -  Tired, decreased energy 0 0 -  Change in  appetite 0 0 -  Feeling bad or failure about yourself  0 0 -  Trouble concentrating 0 0 -  Moving slowly or fidgety/restless 0 0 -  Suicidal thoughts 0 0 -  PHQ-9 Score 0 0 -  Difficult doing work/chores Not difficult at all Not difficult at all -   PMHx, SurgHx, SocialHx, FamHx, Medications, and Allergies were reviewed in the Visit Navigator and updated as appropriate.   Patient Active Problem List   Diagnosis Date Noted  . Vertigo 12/10/2017  . History of anemia 12/10/2017  . Diverticulosis 04/18/2016  . Insomnia 04/18/2016  . Arthralgia, diffuse 04/18/2016  . Muscular deconditioning 04/18/2016  . Acute on chronic diastolic heart failure ([place]) 04/18/2016  . H/O gastric ulcer   . Former smoker   . On amiodarone therapy 04/09/2016  . Contraindication to anticoagulation therapy 03/25/2016  . Gastroesophageal reflux disease   . Acute blood loss anemia   . Paroxysmal atrial fibrillation with RVR ([place]) 02/21/2016  . 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 Left retinal detachment 03/18/2015  . Coronary artery disease involving native coronary artery of native heart with angina pectoris ([place]) 09/19/2014  . HTN (hypertension) 09/19/2014  . Hyperlipidemia LDL goal <70, on Praluent 10/03/2013  . Pseudophakia of right eye 06/05/2013  . Pseudophakia of left eye 05/22/2013   Social History   Tobacco Use  . Smoking status: Former Smoker    Packs/day: 2.00    Years: 20.00    Pack years: 40.00    Types: Cigarettes  Last attempt to quit: 03/27/1990    Years since quitting: 27.7  . Smokeless tobacco: Never Used  Substance Use Topics  . Alcohol use: Not Currently    Comment: Quit drinking this year   . Drug use: No   Current Medications and Allergies:   .  Alirocumab (PRALUENT) 150 MG/ML SOPN, Inject 1 pen into the skin every 14 (fourteen) days., Disp: 2 pen, .  amiodarone (PACERONE) 200 MG tablet, Take 1 tablet (200 mg total) by mouth daily., Disp: 30 tablet,  Rfl: 3 .  lisinopril (PRINIVIL,ZESTRIL) 10 MG tablet, Take 1 tablet (10 mg total) by mouth daily., Disp: 30 tablet, Rfl: 3 .  metoprolol succinate (TOPROL-XL) 25 MG 24 hr tablet, TAKE 1/2 TABLET BY MOUTH DAILY, Disp: 15  .  Polyvinyl Alcohol (LIQUID TEARS OP), Place 1 drop into both eyes daily as needed (DRY EYE)., Disp: , Rfl:  .  Meclizine HCl 25 MG CHEW, Chew 1 tablet (25 mg total) by mouth daily., Disp: 90 each, Rfl: 0   Allergies  Allergen Reactions  . Eliquis [Apixaban] Other (See Comments)    Recurrent GI bleeding occurred from diverticulosis in the setting of anticoagulation therapy for atrial fibrillation.  . 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   Reaction:  Unknown   . Glutamic Acid Other (See Comments)    Pt is allergic to melons. Reaction:Unknown   . Ciprofloxacin Hives  . Pollen Extract Other (See Comments)    Sneezing    Review of Systems   Pertinent items are noted in the HPI. Otherwise, ROS is negative.  Vitals:   Vitals:   12/10/17 0723  BP: 120/68  Pulse: 71  Temp: 98 F (36.7 C)  TempSrc: Oral  SpO2: 99%  Weight: 205 lb (93 kg)  Height: 6\' 3"  (1.905 m)     Body mass index is 25.62 kg/m.  Physical Exam:   Physical Exam  Constitutional: He appears well-developed and well-nourished. No distress.  HENT:  Head: Normocephalic and atraumatic.  Right Ear: External ear normal.  Left Ear: External ear normal.  Nose: Nose normal.  Mouth/Throat: Oropharynx is clear and moist.  Eyes: Pupils are equal, round, and reactive to light. Conjunctivae and EOM are normal.  Neck: Neck supple.  Cardiovascular: Normal rate, regular rhythm and intact distal pulses.  Pulmonary/Chest: Effort normal.  Abdominal: Soft. Bowel sounds are normal.  Musculoskeletal: Normal range of motion.  Neurological: He is alert.  Skin: Skin is warm.  Psychiatric: He has a normal mood and affect. His behavior is normal.  Nursing note and vitals  reviewed.  Assessment and Plan:   [name] was seen today for follow-up.  Diagnoses and all orders for this visit:  Hyperlipidemia LDL goal <70, on Praluent Comments: Upcoming appointment with Cardiology, not fasting today. Will go ahead and add labs. Orders: -     Comprehensive metabolic panel; Future -     Lipid panel; Future  Coronary artery disease involving native coronary artery of native heart with angina pectoris ([place]) Comments: No new concerns. Doing very well. Patient feels about 90% back to normal.  On amiodarone therapy Comments: Thyroid labs ordered.  Orders: -     T4, free; Future -     TSH; Future  Essential hypertension Comments: At goal. No concerns. Continue current treatment.   History of anemia Comments: Labs ordered. No signs of bleeding or anemia.  Orders: -     CBC with Differential/Platelet; Future -  Iron, TIBC and Ferritin Panel  Vertigo Comments: New. Started Meclizine after visit with Cardiology. Still taking regularly. Helping and not causing sedation. Discussed PT. See AVS.  . Reviewed expectations re: course of current medical issues. . Discussed self-management of symptoms. . Outlined signs and symptoms indicating need for more acute intervention. . Patient verbalized understanding and all questions were answered. Marland Kitchen Health Maintenance issues including appropriate healthy diet, exercise, and smoking avoidance were discussed with patient. . See orders for this visit as documented in the electronic medical record. . Patient received an After Visit Summary.  CMA served as Education administrator during this visit. History, Physical, and Plan performed by medical provider. The above documentation has been reviewed and is accurate and complete. Jonathan Holden, D.O.  Jonathan Deutscher, DO [place], [place] [facility] 12/10/2017

## 2017-12-10 ENCOUNTER — Encounter: Payer: Self-pay | Admitting: Family Medicine

## 2017-12-10 ENCOUNTER — Ambulatory Visit (INDEPENDENT_AMBULATORY_CARE_PROVIDER_SITE_OTHER): Payer: Medicare Other | Admitting: Family Medicine

## 2017-12-10 VITALS — BP 120/68 | HR 71 | Temp 98.0°F | Ht 75.0 in | Wt 205.0 lb

## 2017-12-10 DIAGNOSIS — Z79899 Other long term (current) drug therapy: Secondary | ICD-10-CM

## 2017-12-10 DIAGNOSIS — R42 Dizziness and giddiness: Secondary | ICD-10-CM | POA: Diagnosis not present

## 2017-12-10 DIAGNOSIS — I25119 Atherosclerotic heart disease of native coronary artery with unspecified angina pectoris: Secondary | ICD-10-CM | POA: Diagnosis not present

## 2017-12-10 DIAGNOSIS — E785 Hyperlipidemia, unspecified: Secondary | ICD-10-CM | POA: Diagnosis not present

## 2017-12-10 DIAGNOSIS — I10 Essential (primary) hypertension: Secondary | ICD-10-CM | POA: Diagnosis not present

## 2017-12-10 DIAGNOSIS — Z862 Personal history of diseases of the blood and blood-forming organs and certain disorders involving the immune mechanism: Secondary | ICD-10-CM | POA: Diagnosis not present

## 2017-12-10 DIAGNOSIS — Z23 Encounter for immunization: Secondary | ICD-10-CM | POA: Diagnosis not present

## 2017-12-10 NOTE — Patient Instructions (Addendum)
..It takes about 2 weeks for protection to develop after vaccination.  There are many flu viruses, and they are always changing. Each year a new flu vaccine is made to protect against three or four viruses that are likely to cause disease in the upcoming flu season. Even when the vaccine doesn't exactly match these viruses, it may still provide some protection.   Influenza vaccine does not cause flu.  Influenza vaccine may be given at the same time as other vaccines.  3. Talk with your health care provider  Tell your vaccine provider if the person getting the vaccine: ; Has had an allergic reaction after a previous dose of influenza vaccine, or has any severe, life-threatening allergies.  ; Has ever had Guillain-Barr Syndrome (also called GBS).  In some cases, your health care provider may decide to postpone influenza vaccination to a future visit.  People with minor illnesses, such as a cold, may be vaccinated. People who are moderately or severely ill should usually wait until they recover before getting influenza vaccine.  Your health care provider can give you more information.  4. Risks of a reaction  ; Soreness, redness, and swelling where shot is given, fever, muscle aches, and headache can happen after influenza vaccine. ; There may be a very small increased risk of Guillain-Barr Syndrome (GBS) after inactivated influenza vaccine (the flu shot).  Young children who get the flu shot along with pneumococcal vaccine (PCV13), and/or DTaP vaccine at the same time might be slightly more likely to have a seizure caused by fever. Tell your health care provider if a child who is getting flu vaccine has ever had a seizure.  People sometimes faint after medical procedures, including vaccination. Tell your provider if you feel dizzy or have vision changes or ringing in the ears.  As with any medicine, there is a very remote chance of a vaccine causing a severe allergic reaction, other  serious injury, or death.  5. What if there is a serious problem?  An allergic reaction could occur after the vaccinated person leaves the clinic. If you see signs of a severe allergic reaction (hives, swelling of the face and throat, difficulty breathing, a fast heartbeat, dizziness, or weakness), call 9-1-1 and get the person to the nearest hospital.  For other signs that concern you, call your health care provider.  Adverse reactions should be reported to the Vaccine Adverse Event Reporting System (VAERS). Your health care provider will usually file this report, or you can do it yourself. Visit the VAERS website at www.vaers.SamedayNews.es or call 979-072-2573.  VAERS is only for reporting reactions, and VAERS staff do not give medical advice.  6. The National Vaccine Injury Compensation Program  The [employer] Vaccine Injury Compensation Program (VICP) is a federal program that was created to compensate people who may have been injured by certain vaccines. Visit the VICP website at GoldCloset.com.ee or call 228-616-0354 to learn about the program and about filing a claim. There is a time limit to file a claim for compensation.  7. How can I learn more?  ; Ask your health care provider.  ; Call your local or state health department. ; Contact the Centers for Disease Control and Prevention (CDC): - Call 770-781-1341 (1-800-CDC-INFO) or - Visit CDC's influenza website at https://gibson.com/  Vaccine Information Statement (Interim) Inactivated Influenza Vaccine  09/02/2017 42 U.S.C.  [phone]   Department of Health and Geneticist, molecular for Disease Control and Prevention  Office Use Only   How  to Perform the Epley Maneuver The Epley maneuver is an exercise that relieves symptoms of vertigo. Vertigo is the feeling that you or your surroundings are moving when they are not. When you feel vertigo, you may feel like the room is spinning and have trouble walking. Dizziness  is a little different than vertigo. When you are dizzy, you may feel unsteady or light-headed. You can do this maneuver at home whenever you have symptoms of vertigo. You can do it up to 3 times a day until your symptoms go away. Even though the Epley maneuver may relieve your vertigo for a few weeks, it is possible that your symptoms will return. This maneuver relieves vertigo, but it does not relieve dizziness. What are the risks? If it is done correctly, the Epley maneuver is considered safe. Sometimes it can lead to dizziness or nausea that goes away after a short time. If you develop other symptoms, such as changes in vision, weakness, or numbness, stop doing the maneuver and call your health care provider. How to perform the Epley maneuver 1. Sit on the edge of a bed or table with your back straight and your legs extended or hanging over the edge of the bed or table. 2. Turn your head halfway toward the affected ear or side. 3. Lie backward quickly with your head turned until you are lying flat on your back. You may want to position a pillow under your shoulders. 4. Hold this position for 30 seconds. You may experience an attack of vertigo. This is normal. 5. Turn your head to the opposite direction until your unaffected ear is facing the floor. 6. Hold this position for 30 seconds. You may experience an attack of vertigo. This is normal. Hold this position until the vertigo stops. 7. Turn your whole body to the same side as your head. Hold for another 30 seconds. 8. Sit back up. You can repeat this exercise up to 3 times a day. Follow these instructions at home:  After doing the Epley maneuver, you can return to your normal activities.  Ask your health care provider if there is anything you should do at home to prevent vertigo. He or she may recommend that you: ? Keep your head raised (elevated) with two or more pillows while you sleep. ? Do not sleep on the side of your affected  ear. ? Get up slowly from bed. ? Avoid sudden movements during the day. ? Avoid extreme head movement, like looking up or bending over. Contact a health care provider if:  Your vertigo gets worse.  You have other symptoms, including: ? Nausea. ? Vomiting. ? Headache. Get help right away if:  You have vision changes.  You have a severe or worsening headache or neck pain.  You cannot stop vomiting.  You have new numbness or weakness in any part of your body. Summary  Vertigo is the feeling that you or your surroundings are moving when they are not.  The Epley maneuver is an exercise that relieves symptoms of vertigo.  If the Epley maneuver is done correctly, it is considered safe. You can do it up to 3 times a day. This information is not intended to replace advice given to you by your health care provider. Make sure you discuss any questions you have with your health care provider. Document Released: 01/10/2013 Document Revised: 11/26/2015 Document Reviewed: 11/26/2015 Elsevier Interactive Patient Education  2017 [employer].

## 2017-12-12 NOTE — Progress Notes (Signed)
Cardiology Office Note:    Date:  12/14/2017   ID:  LOYALTY BRASHIER, DOB 1945/12/22, MRN [id number]  PCP:  Briscoe Deutscher, DO  Cardiologist:  Sinclair Grooms, MD   Referring MD: Briscoe Deutscher, DO   Chief Complaint  Patient presents with  . Coronary Artery Disease  . Atrial Fibrillation  . Advice Only    Amiodarone    History of Present Illness:    Jonathan Holden is a 72 y.o. male with a hx of coronary artery disease, right coronary angioplasty during acute infarction, history of gastric ulcer, recurrent diverticular colonic bleeding on Eliquis, paroxysmal atrial fibrillation with rhythm control on Amiodarone, lumbar discitis with streptococcal bacteremia, hyperlipidemia, and hypertension.  Chest is doing well.  He has not had angina.  No excessive shortness of breath or exertional limitations.  No palpitations or unexplained weakness.  He has not had syncope.  No medication side effects.  He denies neurological complaints.  He has not had blood in his urine or stool.  We are unable to use anticoagulation therapy because of recurrent GI bleeding on Eliquis.    Past Medical History:  Diagnosis Date  . Acute blood loss anemia   . Acute lower GI bleeding 03/19/2016  . Arthritis   . Atrial fibrillation with RVR ([place]) 02/21/2016  . Coronary artery disease   . Diverticulosis 04/18/2016  . Endocarditis of native valve   . Former smoker   . H/O gastric ulcer   . History of MI (myocardial infarction) 1992  . Hyperlipidemia   . Hypertension   . Infection of intervertebral disc ([place])   . Insomnia 04/18/2016  . Left retinal detachment 03/18/2015  . Loose body of right shoulder   . 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 Pseudophakia of left eye 05/22/2013  . Pseudophakia of right eye 06/05/2013  . S/P coronary angioplasty   . Streptococcal bacteremia     Past Surgical History:  Procedure Laterality Date  . CARDIOVASCULAR STRESS TEST  08/22/2008  .  CORONARY ANGIOPLASTY  1992  . ERCP W/ SPHICTEROTOMY  12/15/2002  . LAPAROSCOPIC CHOLECYSTECTOMY  12/16/2002  . LUMBAR LAMINECTOMY     L5  . TEE WITHOUT CARDIOVERSION N/A 02/28/2016   Procedure: TRANSESOPHAGEAL ECHOCARDIOGRAM (TEE);  Surgeon: Fay Records, MD;  Location: [facility] ENDOSCOPY;  Service: Cardiovascular;  Laterality: N/A;    Current Medications: Current Meds  Medication Sig  . Alirocumab (PRALUENT) 150 MG/ML SOPN Inject 1 pen into the skin every 14 (fourteen) days.  Marland Kitchen amiodarone (PACERONE) 200 MG tablet Take 1 tablet (200 mg total) by mouth daily.  Marland Kitchen lisinopril (PRINIVIL,ZESTRIL) 10 MG tablet Take 1 tablet (10 mg total) by mouth daily.  . Meclizine HCl 25 MG CHEW Chew 1 tablet (25 mg total) by mouth daily.  . metoprolol succinate (TOPROL-XL) 25 MG 24 hr tablet TAKE 1/2 TABLET BY MOUTH DAILY  . Polyvinyl Alcohol (LIQUID TEARS OP) Place 1 drop into both eyes daily as needed (DRY EYE).     Allergies:   Eliquis [apixaban]; Food; Glutamic acid; Ciprofloxacin; and Pollen extract   Social History   Socioeconomic History  . Marital status: Divorced    Spouse name: Not on file  . Number of children: Not on file  . Years of education: Not on file  . Highest education level: Not on file  Occupational History  . Not on file  Social Needs  . Financial resource strain: Not on file  . Food insecurity:  Worry: Not on file    Inability: Not on file  . Transportation needs:    Medical: Not on file    Non-medical: Not on file  Tobacco Use  . Smoking status: Former Smoker    Packs/day: 2.00    Years: 20.00    Pack years: 40.00    Types: Cigarettes    Last attempt to quit: 03/27/1990    Years since quitting: 27.7  . Smokeless tobacco: Never Used  Substance and Sexual Activity  . Alcohol use: Not Currently    Comment: Quit drinking this year   . Drug use: No  . Sexual activity: Not Currently  Lifestyle  . Physical activity:    Days per week: Not on file    Minutes per session:  Not on file  . Stress: Not on file  Relationships  . Social connections:    Talks on phone: Not on file    Gets together: Not on file    Attends religious service: Not on file    Active member of club or organization: Not on file    Attends meetings of clubs or organizations: Not on file    Relationship status: Not on file  Other Topics Concern  . Not on file  Social History Narrative  . Not on file     Family History: The patient's family history includes Emphysema in his unknown relative; Heart attack (age of onset: 13) in his father; Heart disease in his maternal grandfather and paternal grandfather; Other in his brother and mother.  ROS:   Please see the history of present illness.    Rare episodes of back discomfort otherwise unremarkable.  All other systems reviewed and are negative.  EKGs/Labs/Other Studies Reviewed:    The following studies were reviewed today: No significant cardiovascular imaging or functional studies  EKG:  EKG is not ordered today.    Recent Labs: 02/15/2017: ALT 19 05/12/2017: BUN 12; Creatinine, Ser 1.01; Hemoglobin 14.9; Magnesium 2.2; Platelets 232; Potassium 4.3; Sodium 135; TSH 0.880  Recent Lipid Panel    Component Value Date/Time   CHOL 153 09/01/2017 1244   TRIG 210 (H) 09/01/2017 1244   HDL 35 (L) 09/01/2017 1244   CHOLHDL 4.4 09/01/2017 1244   CHOLHDL 2 10/22/2014 1631   VLDL 22.2 10/22/2014 1631   LDLCALC 76 09/01/2017 1244   LDLDIRECT 33 05/08/2016 1128   LDLDIRECT 143.5 12/06/2012 0909    Physical Exam:    VS:  BP 108/82   Pulse 67   Ht 6\' 3"  (1.905 m)   Wt 204 lb (92.5 kg)   SpO2 99%   BMI 25.50 kg/m     Wt Readings from Last 3 Encounters:  12/14/17 204 lb (92.5 kg)  12/10/17 205 lb (93 kg)  09/01/17 204 lb 12.8 oz (92.9 kg)     GEN:  Well nourished, well developed in no acute distress HEENT: Normal NECK: No JVD. LYMPHATICS: No lymphadenopathy CARDIAC: RRR, no murmur, no gallop, no edema. VASCULAR: 2+  bilateral radial and carotid pulses.  No bruits. RESPIRATORY:  Clear to auscultation without rales, wheezing or rhonchi  ABDOMEN: Soft, non-tender, non-distended, No pulsatile mass, MUSCULOSKELETAL: No deformity  SKIN: Warm and dry NEUROLOGIC:  Alert and oriented x 3 PSYCHIATRIC:  Normal affect   ASSESSMENT:    1. Paroxysmal atrial fibrillation with RVR ([place])   2. Coronary artery disease of native artery of native heart with stable angina pectoris ([place])   3. Other hyperlipidemia   4. On amiodarone  therapy   5. Chronic diastolic HF (heart failure) (H[facility])    PLAN:    In order of problems listed above:  1. Suppressed with low-dose amiodarone 200 mg/day. 2. Stable without angina pectoris.  Months to for recurrence.  Encourage 150 minutes of moderate aerobic activity. 3. Is having no issues with alirocumab.  Most recent LDL was 76 in August 2000. 4. TSH and liver panel today.  When he returns in 6 months will repeat those labs and get AP and lateral chest x-ray. 5. No evidence of diastolic heart failure, i.e. no volume overload or JVD noted.  150 minutes of moderate aerobic activity per week.  Clinical follow-up in 6 months.  Consider decreasing amiodarone to 100 mg/day at that time.   Medication Adjustments/Labs and Tests Ordered: Current medicines are reviewed at length with the patient today.  Concerns regarding medicines are outlined above.  Orders Placed This Encounter  Procedures  . TSH  . Hepatic function panel  . Basic metabolic panel   No orders of the defined types were placed in this encounter.   Patient Instructions  Medication Instructions:  Your physician recommends that you continue on your current medications as directed. Please refer to the Current Medication list given to you today.  If you need a refill on your cardiac medications before your next appointment, please call your pharmacy.   Lab work: TSH, liver and BMET today  If you have labs (blood work)  drawn today and your tests are completely normal, you will receive your results only by: Marland Kitchen MyChart Message (if you have MyChart) OR . A paper copy in the mail If you have any lab test that is abnormal or we need to change your treatment, we will call you to review the results.  Testing/Procedures: None  Follow-Up: At [facility], you and your health needs are our priority.  As part of our continuing mission to provide you with exceptional heart care, we have created designated Provider Care Teams.  These Care Teams include your primary Cardiologist (physician) and Advanced Practice Providers (APPs -  Physician Assistants and Nurse Practitioners) who all work together to provide you with the care you need, when you need it. You will need a follow up appointment in 6 months.  Please call our office 2 months in advance to schedule this appointment.  You may see Sinclair Grooms, MD or one of the following Advanced Practice Providers on your designated Care Team:   Truitt Merle, NP Cecilie Kicks, NP . [name], NP  Any Other Special Instructions Will Be Listed Below (If Applicable).       Signed, Sinclair Grooms, MD  12/14/2017 8:35 AM    [place]

## 2017-12-13 ENCOUNTER — Ambulatory Visit: Payer: Medicare Other | Admitting: Family Medicine

## 2017-12-13 DIAGNOSIS — H43821 Vitreomacular adhesion, right eye: Secondary | ICD-10-CM | POA: Diagnosis not present

## 2017-12-13 DIAGNOSIS — H3522 Other non-diabetic proliferative retinopathy, left eye: Secondary | ICD-10-CM | POA: Diagnosis not present

## 2017-12-13 DIAGNOSIS — Z8669 Personal history of other diseases of the nervous system and sense organs: Secondary | ICD-10-CM | POA: Diagnosis not present

## 2017-12-13 DIAGNOSIS — H20022 Recurrent acute iridocyclitis, left eye: Secondary | ICD-10-CM | POA: Diagnosis not present

## 2017-12-13 DIAGNOSIS — H35373 Puckering of macula, bilateral: Secondary | ICD-10-CM | POA: Diagnosis not present

## 2017-12-14 ENCOUNTER — Encounter: Payer: Self-pay | Admitting: Interventional Cardiology

## 2017-12-14 ENCOUNTER — Ambulatory Visit (INDEPENDENT_AMBULATORY_CARE_PROVIDER_SITE_OTHER): Payer: Medicare Other | Admitting: Interventional Cardiology

## 2017-12-14 VITALS — BP 108/82 | HR 67 | Ht 75.0 in | Wt 204.0 lb

## 2017-12-14 DIAGNOSIS — I480 Paroxysmal atrial fibrillation: Secondary | ICD-10-CM

## 2017-12-14 DIAGNOSIS — E7849 Other hyperlipidemia: Secondary | ICD-10-CM | POA: Diagnosis not present

## 2017-12-14 DIAGNOSIS — I25118 Atherosclerotic heart disease of native coronary artery with other forms of angina pectoris: Secondary | ICD-10-CM

## 2017-12-14 DIAGNOSIS — Z79899 Other long term (current) drug therapy: Secondary | ICD-10-CM

## 2017-12-14 DIAGNOSIS — I25119 Atherosclerotic heart disease of native coronary artery with unspecified angina pectoris: Secondary | ICD-10-CM

## 2017-12-14 DIAGNOSIS — I5032 Chronic diastolic (congestive) heart failure: Secondary | ICD-10-CM | POA: Diagnosis not present

## 2017-12-14 LAB — HEPATIC FUNCTION PANEL
ALK PHOS: 100 IU/L (ref 39–117)
ALT: 17 IU/L (ref 0–44)
AST: 18 IU/L (ref 0–40)
Albumin: 4.6 g/dL (ref 3.5–4.8)
BILIRUBIN TOTAL: 0.4 mg/dL (ref 0.0–1.2)
Bilirubin, Direct: 0.08 mg/dL (ref 0.00–0.40)
Total Protein: 7 g/dL (ref 6.0–8.5)

## 2017-12-14 LAB — BASIC METABOLIC PANEL
BUN/Creatinine Ratio: 12 (ref 10–24)
BUN: 15 mg/dL (ref 8–27)
CALCIUM: 9.9 mg/dL (ref 8.6–10.2)
CHLORIDE: 98 mmol/L (ref 96–106)
CO2: 23 mmol/L (ref 20–29)
CREATININE: 1.21 mg/dL (ref 0.76–1.27)
GFR calc non Af Amer: 59 mL/min/{1.73_m2} — ABNORMAL LOW (ref >59)
GFR, EST AFRICAN AMERICAN: 68 mL/min/{1.73_m2} (ref >59)
Glucose: 99 mg/dL (ref 65–99)
Potassium: 4.4 mmol/L (ref 3.5–5.2)
Sodium: 136 mmol/L (ref 134–144)

## 2017-12-14 LAB — TSH: TSH: 2.16 u[IU]/mL (ref 0.450–4.500)

## 2017-12-14 NOTE — Patient Instructions (Signed)
Medication Instructions:  Your physician recommends that you continue on your current medications as directed. Please refer to the Current Medication list given to you today.  If you need a refill on your cardiac medications before your next appointment, please call your pharmacy.   Lab work: TSH, liver and BMET today  If you have labs (blood work) drawn today and your tests are completely normal, you will receive your results only by: Marland Kitchen MyChart Message (if you have MyChart) OR . A paper copy in the mail If you have any lab test that is abnormal or we need to change your treatment, we will call you to review the results.  Testing/Procedures: None  Follow-Up: At [facility], you and your health needs are our priority.  As part of our continuing mission to provide you with exceptional heart care, we have created designated Provider Care Teams.  These Care Teams include your primary Cardiologist (physician) and Advanced Practice Providers (APPs -  Physician Assistants and Nurse Practitioners) who all work together to provide you with the care you need, when you need it. You will need a follow up appointment in 6 months.  Please call our office 2 months in advance to schedule this appointment.  You may see Sinclair Grooms, MD or one of the following Advanced Practice Providers on your designated Care Team:   Truitt Merle, NP Cecilie Kicks, NP . [name], NP  Any Other Special Instructions Will Be Listed Below (If Applicable).

## 2018-01-03 ENCOUNTER — Ambulatory Visit: Payer: Medicare Other | Admitting: Interventional Cardiology

## 2018-01-09 ENCOUNTER — Other Ambulatory Visit (HOSPITAL_COMMUNITY): Payer: Self-pay | Admitting: Nurse Practitioner

## 2018-02-22 ENCOUNTER — Other Ambulatory Visit: Payer: Self-pay | Admitting: Pharmacist

## 2018-02-22 MED ORDER — ALIROCUMAB 150 MG/ML ~~LOC~~ SOAJ: 1.0000 "pen " | SUBCUTANEOUS | 11 refills | Status: DC

## 2018-03-02 DIAGNOSIS — L308 Other specified dermatitis: Secondary | ICD-10-CM | POA: Diagnosis not present

## 2018-03-02 DIAGNOSIS — L986 Other infiltrative disorders of the skin and subcutaneous tissue: Secondary | ICD-10-CM | POA: Diagnosis not present

## 2018-03-02 DIAGNOSIS — L820 Inflamed seborrheic keratosis: Secondary | ICD-10-CM | POA: Diagnosis not present

## 2018-03-02 DIAGNOSIS — I781 Nevus, non-neoplastic: Secondary | ICD-10-CM | POA: Diagnosis not present

## 2018-03-02 DIAGNOSIS — Z85828 Personal history of other malignant neoplasm of skin: Secondary | ICD-10-CM | POA: Diagnosis not present

## 2018-03-02 DIAGNOSIS — D225 Melanocytic nevi of trunk: Secondary | ICD-10-CM | POA: Diagnosis not present

## 2018-03-02 DIAGNOSIS — C44519 Basal cell carcinoma of skin of other part of trunk: Secondary | ICD-10-CM | POA: Diagnosis not present

## 2018-03-02 DIAGNOSIS — L218 Other seborrheic dermatitis: Secondary | ICD-10-CM | POA: Diagnosis not present

## 2018-03-02 DIAGNOSIS — L570 Actinic keratosis: Secondary | ICD-10-CM | POA: Diagnosis not present

## 2018-03-02 DIAGNOSIS — D2260 Melanocytic nevi of unspecified upper limb, including shoulder: Secondary | ICD-10-CM | POA: Diagnosis not present

## 2018-03-02 DIAGNOSIS — L821 Other seborrheic keratosis: Secondary | ICD-10-CM | POA: Diagnosis not present

## 2018-03-02 DIAGNOSIS — L578 Other skin changes due to chronic exposure to nonionizing radiation: Secondary | ICD-10-CM | POA: Diagnosis not present

## 2018-03-02 DIAGNOSIS — B351 Tinea unguium: Secondary | ICD-10-CM | POA: Diagnosis not present

## 2018-03-02 DIAGNOSIS — L309 Dermatitis, unspecified: Secondary | ICD-10-CM | POA: Diagnosis not present

## 2018-03-02 DIAGNOSIS — D2270 Melanocytic nevi of unspecified lower limb, including hip: Secondary | ICD-10-CM | POA: Diagnosis not present

## 2018-03-02 DIAGNOSIS — L300 Nummular dermatitis: Secondary | ICD-10-CM | POA: Diagnosis not present

## 2018-03-02 DIAGNOSIS — D1801 Hemangioma of skin and subcutaneous tissue: Secondary | ICD-10-CM | POA: Diagnosis not present

## 2018-03-02 DIAGNOSIS — Z1283 Encounter for screening for malignant neoplasm of skin: Secondary | ICD-10-CM | POA: Diagnosis not present

## 2018-03-02 DIAGNOSIS — D485 Neoplasm of uncertain behavior of skin: Secondary | ICD-10-CM | POA: Diagnosis not present

## 2018-03-02 DIAGNOSIS — L814 Other melanin hyperpigmentation: Secondary | ICD-10-CM | POA: Diagnosis not present

## 2018-03-07 ENCOUNTER — Telehealth: Payer: Self-pay | Admitting: Pharmacist

## 2018-03-07 NOTE — Telephone Encounter (Signed)
Submitted PASS application for Praluent patient assistance.

## 2018-03-28 DIAGNOSIS — M25542 Pain in joints of left hand: Secondary | ICD-10-CM | POA: Diagnosis not present

## 2018-03-28 DIAGNOSIS — L820 Inflamed seborrheic keratosis: Secondary | ICD-10-CM | POA: Diagnosis not present

## 2018-03-28 DIAGNOSIS — Z114 Encounter for screening for human immunodeficiency virus [HIV]: Secondary | ICD-10-CM | POA: Diagnosis not present

## 2018-03-28 DIAGNOSIS — Z1159 Encounter for screening for other viral diseases: Secondary | ICD-10-CM | POA: Diagnosis not present

## 2018-03-28 DIAGNOSIS — Z79899 Other long term (current) drug therapy: Secondary | ICD-10-CM | POA: Diagnosis not present

## 2018-03-28 DIAGNOSIS — L400 Psoriasis vulgaris: Secondary | ICD-10-CM | POA: Diagnosis not present

## 2018-03-28 DIAGNOSIS — C44519 Basal cell carcinoma of skin of other part of trunk: Secondary | ICD-10-CM | POA: Diagnosis not present

## 2018-03-28 DIAGNOSIS — M25541 Pain in joints of right hand: Secondary | ICD-10-CM | POA: Diagnosis not present

## 2018-03-29 ENCOUNTER — Telehealth: Payer: Self-pay | Admitting: Pharmacist

## 2018-03-29 NOTE — Telephone Encounter (Signed)
Patient approved for Praluent assistance through the PASS program through 01/19/19. He is aware and will call with issues.

## 2018-04-11 ENCOUNTER — Other Ambulatory Visit (HOSPITAL_COMMUNITY): Payer: Self-pay | Admitting: Nurse Practitioner

## 2018-05-13 IMAGING — CT CT MAXILLOFACIAL W/ CM
3 series · 15 of 47 positions shown, 18 images · IV contrast (agent unspecified)
Comparison: None.

ADDENDUM:
There is mild lucency surrounding the roots of the most posterior
remaining right mandibular molar. Otherwise, there is no abnormality
of the remaining teeth. I discussed this on 02/26/2016 at [DATE] with
Dr. MUHD MALI KASUMA .
CLINICAL DATA: Bacteremia.  Investigation for source.

EXAM:
CT MAXILLOFACIAL WITH CONTRAST
TECHNIQUE: Multidetector CT imaging of the maxillofacial structures was
performed. Multiplanar CT image reconstructions were also generated.
A small metallic BB was placed on the right temple in order to
reliably differentiate right from left.

[Series 3: facial/orbits w 2.0 st · axial · 0.37mm/px · z∈[-282,-110]mm · 9 of 100 slices shown, 12 images]
[im 7/100  brain]
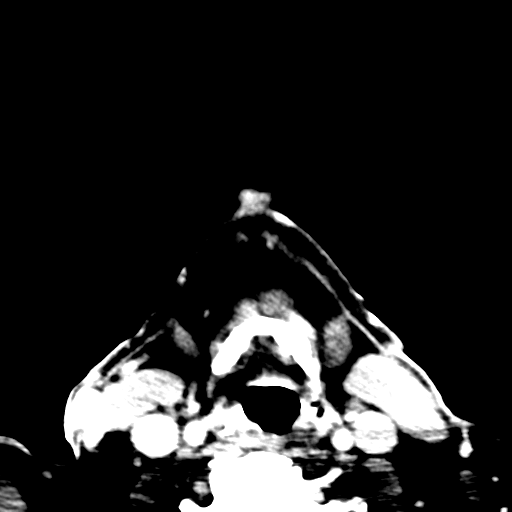
[im 7/100  bone]
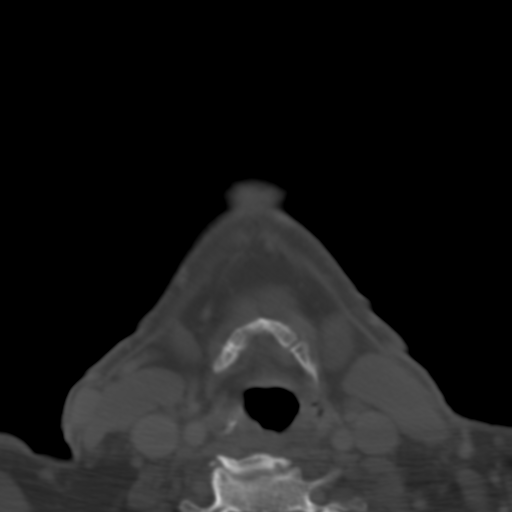
[im 18/100  bone]
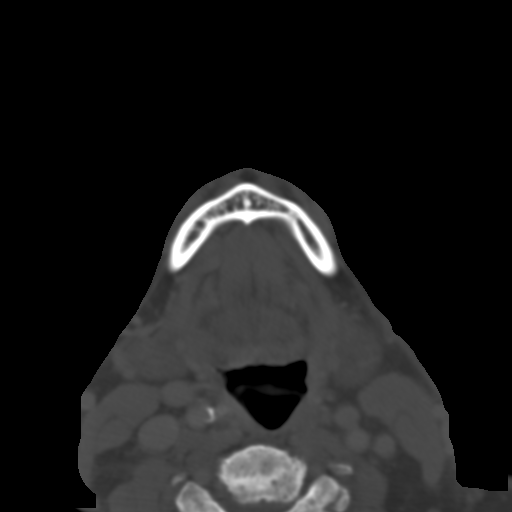
[im 28/100  bone]
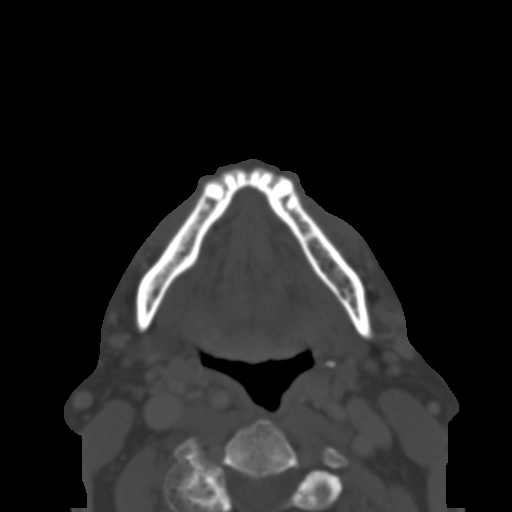
[im 38/100  bone]
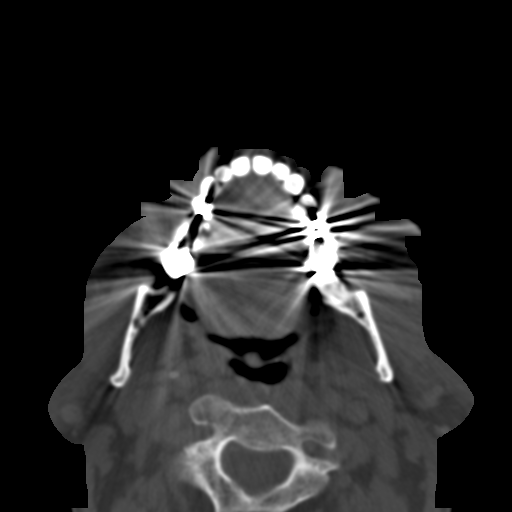
[im 52/100  brain]
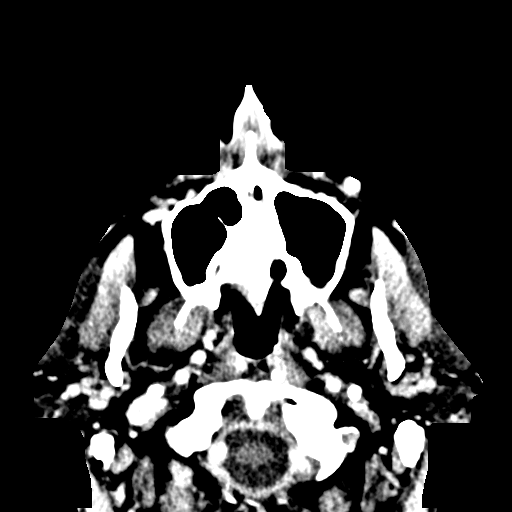
[im 52/100  bone]
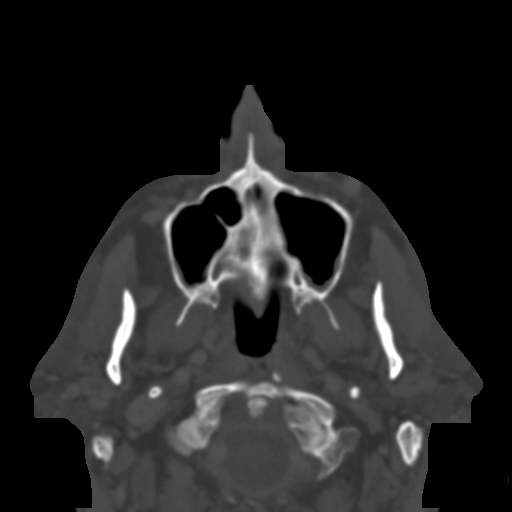
[im 62/100  bone]
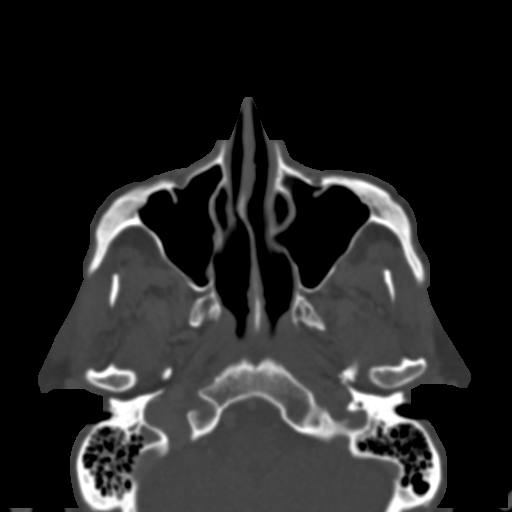
[im 72/100  bone]
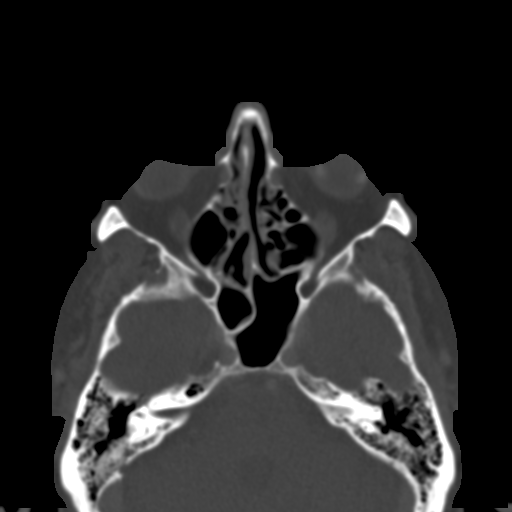
[im 82/100  bone]
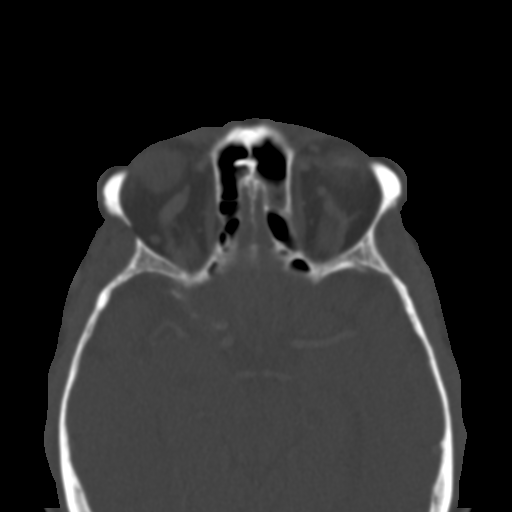
[im 93/100  brain]
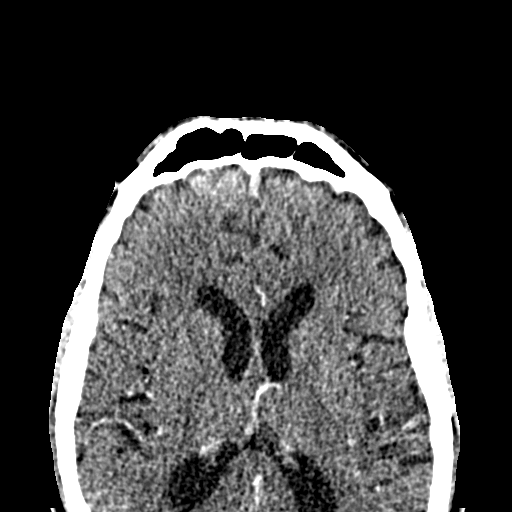
[im 93/100  bone]
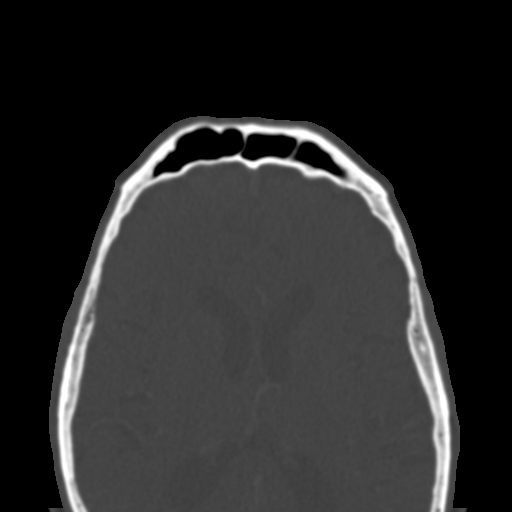

[Series 7: coronal soft tissue · coronal · 0.39mm/px · 3 of 85 slices shown]
[im 29/85  bone]
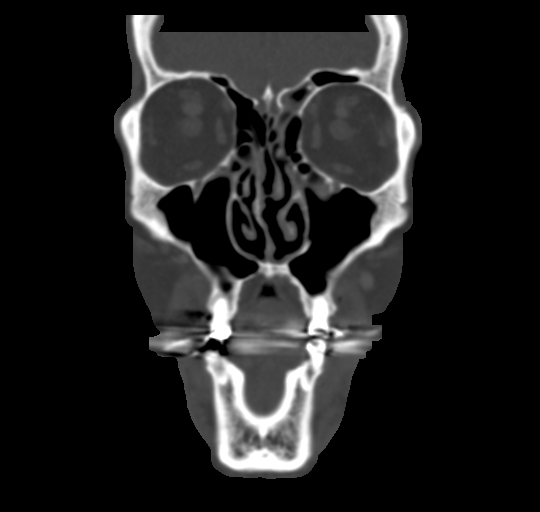
[im 38/85  bone]
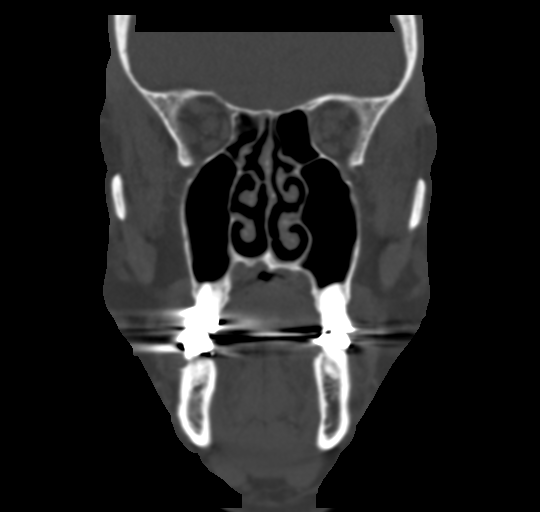
[im 47/85  bone]
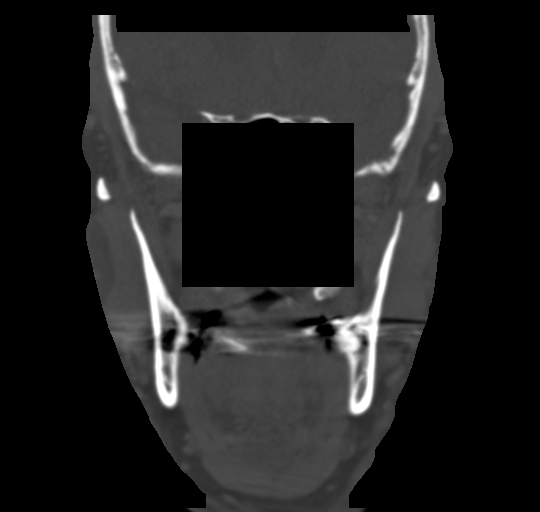

[Series 8: sagittal soft tissue · sagittal · 0.35mm/px · 3 of 83 slices shown]
[im 28/83  bone]
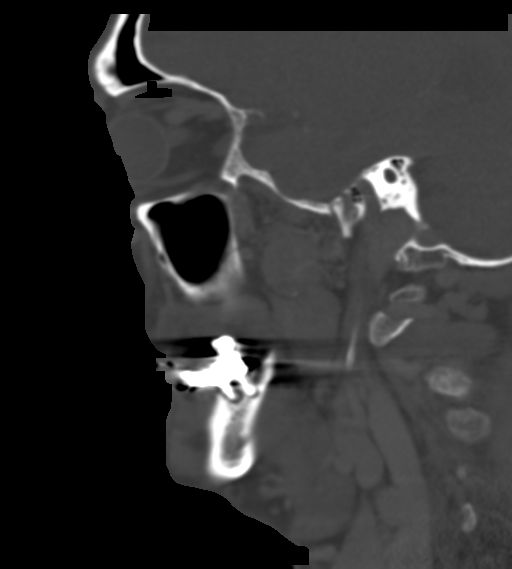
[im 42/83  bone]
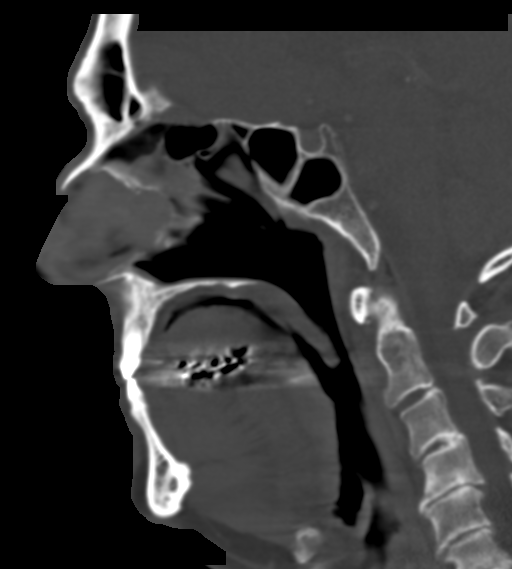
[im 55/83  bone]
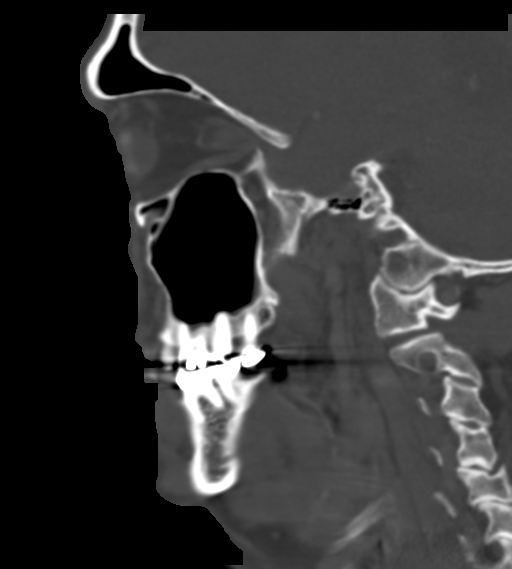

[15 of 47 positions shown; findings below may reference images not displayed]

FINDINGS: Osseous: No facial fracture.

Orbits: There is a left scleral band. Bilateral lens replacements.
Otherwise normal.

Sinuses

--Frontal: There is mild left frontal sinus mucosal thickening
inferiorly with suspected occlusion of the left frontal recess. No
right frontal sinus mucosal thickening. The right frontal recess is
patent.

--Ethmoid: Minimal ethmoid sinus mucosal thickening without fluid
levels or osseous changes.

--Sphenoid: Minimal sphenoid mucosal thickening without fluid levels
or osseous abnormalities. Sphenoethmoidal recesses and sphenoid
ostia are patent.

--Maxillary: Bilateral minimal maxillary mucosal thickening without
fluid levels or osseous abnormalities. There is bilateral narrowing
of the infundibular recesses.

--Nasal cavity: 4 mm of rightward septal deviation.

--Nasopharynx: Clear.

--Mastoid air cells: No effusion.

Soft tissues: Normal.

Limited intracranial: Normal.
IMPRESSION: Very mild mucosal thickening of all paranasal sinuses without
evidence of acute sinusitis. Stenosis of the left frontal recess and
bilateral infundibular recesses.

## 2018-06-07 ENCOUNTER — Telehealth: Payer: Self-pay

## 2018-06-07 DIAGNOSIS — L400 Psoriasis vulgaris: Secondary | ICD-10-CM | POA: Diagnosis not present

## 2018-06-07 DIAGNOSIS — J329 Chronic sinusitis, unspecified: Secondary | ICD-10-CM | POA: Diagnosis not present

## 2018-06-07 DIAGNOSIS — L0201 Cutaneous abscess of face: Secondary | ICD-10-CM | POA: Diagnosis not present

## 2018-06-07 DIAGNOSIS — Z79899 Other long term (current) drug therapy: Secondary | ICD-10-CM | POA: Diagnosis not present

## 2018-06-07 DIAGNOSIS — L570 Actinic keratosis: Secondary | ICD-10-CM | POA: Diagnosis not present

## 2018-06-07 DIAGNOSIS — D485 Neoplasm of uncertain behavior of skin: Secondary | ICD-10-CM | POA: Diagnosis not present

## 2018-06-07 NOTE — Telephone Encounter (Signed)
YOUR CARDIOLOGY TEAM HAS ARRANGED FOR AN E-VISIT FOR YOUR APPOINTMENT - PLEASE REVIEW IMPORTANT INFORMATION BELOW SEVERAL DAYS PRIOR TO YOUR APPOINTMENT  Due to the recent COVID-19 pandemic, we are transitioning in-person office visits to tele-medicine visits in an effort to decrease unnecessary exposure to our patients, their families, and staff. These visits are billed to your insurance just like a normal visit is. We also encourage you to sign up for MyChart if you have not already done so. You will need a smartphone if possible. For patients that do not have this, we can still complete the visit using a regular telephone but do prefer a smartphone to enable video when possible. You may have a family member that lives with you that can help. If possible, we also ask that you have a blood pressure cuff and scale at home to measure your blood pressure, heart rate and weight prior to your scheduled appointment. Patients with clinical needs that need an in-person evaluation and testing will still be able to come to the office if absolutely necessary. If you have any questions, feel free to call our office.     YOUR PROVIDER WILL BE USING THE FOLLOWING PLATFORM TO COMPLETE YOUR VISIT: Doximity  . IF USING MYCHART - How to Download the MyChart App to Your SmartPhone   - If Apple, go to App Store and type in MyChart in the search bar and download the app. If Android, ask patient to go to Google Play Store and type in MyChart in the search bar and download the app. The app is free but as with any other app downloads, your phone may require you to verify saved payment information or Apple/Android password.  - You will need to then log into the app with your MyChart username and password, and select [facility] as your healthcare provider to link the account.  - When it is time for your visit, go to the MyChart app, find appointments, and click Begin Video Visit. Be sure to Select Allow for your device to  access the Microphone and Camera for your visit. You will then be connected, and your provider will be with you shortly.  **If you have any issues connecting or need assistance, please contact MyChart service desk [phone] ([phone])**  **If using a computer, in order to ensure the best quality for your visit, you will need to use either of the following Internet Browsers: Google Chrome or Microsoft Edge**  . IF USING DOXIMITY or [email or web address].ME - The staff will give you instructions on receiving your link to join the meeting the day of your visit.      2-3 DAYS BEFORE YOUR APPOINTMENT  You will receive a telephone call from one of our [facility] team members - your caller ID may say "Unknown caller." If this is a video visit, we will walk you through how to get the video launched on your phone. We will remind you check your blood pressure, heart rate and weight prior to your scheduled appointment. If you have an Apple Watch or Kardia, please upload any pertinent ECG strips the day before or morning of your appointment to MyChart. Our staff will also make sure you have reviewed the consent and agree to move forward with your scheduled tele-health visit.     THE DAY OF YOUR APPOINTMENT  Approximately 15 minutes prior to your scheduled appointment, you will receive a telephone call from one of HeartCare team - your caller ID may say "Unknown caller."    Our staff will confirm medications, vital signs for the day and any symptoms you may be experiencing. Please have this information available prior to the time of visit start. It may also be helpful for you to have a pad of paper and pen handy for any instructions given during your visit. They will also walk you through joining the smartphone meeting if this is a video visit.    CONSENT FOR TELE-HEALTH VISIT - PLEASE REVIEW  I hereby voluntarily request, consent and authorize [facility] and its employed or contracted physicians, physician  assistants, nurse practitioners or other licensed health care professionals (the Practitioner), to provide me with telemedicine health care services (the "Services") as deemed necessary by the treating Practitioner. I acknowledge and consent to receive the Services by the Practitioner via telemedicine. I understand that the telemedicine visit will involve communicating with the Practitioner through live audiovisual communication technology and the disclosure of certain medical information by electronic transmission. I acknowledge that I have been given the opportunity to request an in-person assessment or other available alternative prior to the telemedicine visit and am voluntarily participating in the telemedicine visit.  I understand that I have the right to withhold or withdraw my consent to the use of telemedicine in the course of my care at any time, without affecting my right to future care or treatment, and that the Practitioner or I may terminate the telemedicine visit at any time. I understand that I have the right to inspect all information obtained and/or recorded in the course of the telemedicine visit and may receive copies of available information for a reasonable fee.  I understand that some of the potential risks of receiving the Services via telemedicine include:  . Delay or interruption in medical evaluation due to technological equipment failure or disruption; . Information transmitted may not be sufficient (e.g. poor resolution of images) to allow for appropriate medical decision making by the Practitioner; and/or  . In rare instances, security protocols could fail, causing a breach of personal health information.  Furthermore, I acknowledge that it is my responsibility to provide information about my medical history, conditions and care that is complete and accurate to the best of my ability. I acknowledge that Practitioner's advice, recommendations, and/or decision may be based on  factors not within their control, such as incomplete or inaccurate data provided by me or distortions of diagnostic images or specimens that may result from electronic transmissions. I understand that the practice of medicine is not an exact science and that Practitioner makes no warranties or guarantees regarding treatment outcomes. I acknowledge that I will receive a copy of this consent concurrently upon execution via email to the email address I last provided but may also request a printed copy by calling the office of [facility].    I understand that my insurance will be billed for this visit.   I have read or had this consent read to me. . I understand the contents of this consent, which adequately explains the benefits and risks of the Services being provided via telemedicine.  . I have been provided ample opportunity to ask questions regarding this consent and the Services and have had my questions answered to my satisfaction. . I give my informed consent for the services to be provided through the use of telemedicine in my medical care  By participating in this telemedicine visit I agree to the above.  

## 2018-06-07 NOTE — Progress Notes (Signed)
Virtual Visit via Telephone Note   This visit type was conducted due to national recommendations for restrictions regarding the COVID-19 Pandemic (e.g. social distancing) in an effort to limit this patient's exposure and mitigate transmission in our community.  Due to his co-morbid illnesses, this patient is at least at moderate risk for complications without adequate follow up.  This format is felt to be most appropriate for this patient at this time.  The patient did not have access to video technology/had technical difficulties with video requiring transitioning to audio format only (telephone).  All issues noted in this document were discussed and addressed.  No physical exam could be performed with this format.  Please refer to the patient's chart for his  consent to telehealth for [facility].   Date:  06/09/2018   ID:  Jonathan Holden, DOB October 07, 1945, MRN [id number]  Patient Location: Home Provider Location: Home  PCP:  Briscoe Deutscher, DO  Cardiologist:  Sinclair Grooms, MD  Electrophysiologist:  None   Evaluation Performed:  Follow-Up Visit  Chief Complaint: 45-month follow-up for PAF, CAD seen for Dr. Tamala Julian  History of Present Illness:    Jonathan Holden is a 73 y.o. 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 Tamala Julian 12/14/2017 in follow-up. He was noted to be doing well with no anginal symptoms, no palpitations, shortness of breath or syncope.  He is not on anticoagulation therapy secondary to recurrent GI bleeding on Eliquis in the past. He is maintained on amiodarone. Follows with our lipid clinic for the administration of Praluent.   Today he reports that he has been feeling well from a cardiac perspective. He has had no palpations, shortness of  breath or abnormal fatigue.  He continues to have vertigo symptoms with driving but this is controlled on meclizine.  His BP is elevated today however he reports that he does not typically take this on a regular basis and was also rushing around this morning prior to reading.  We discussed options for increasing either Toprol or lisinopril dosage however he prefers to keep a BP log for the next 2 weeks and report those readings prior to dosage increase.  He is anxious about trying to get off of amiodarone.  We will reduce his dose today to 100 mg daily and see how he does.  He reports regular eye exams at [facility].  He is for labs and CXR today given amiodarone.   The patient does not have symptoms concerning for COVID-19 infection (fever, chills, cough, or new shortness of breath).   Past Medical History:  Diagnosis Date  . Acute blood loss anemia   . Acute lower GI bleeding 03/19/2016  . Arthritis   . Atrial fibrillation with RVR ([place]) 02/21/2016  . Coronary artery disease   . Diverticulosis 04/18/2016  . Endocarditis of native valve   . Former smoker   . H/O gastric ulcer   . History of MI (myocardial infarction) 1992  . Hyperlipidemia   . Hypertension   . Infection of intervertebral disc ([place])   . Insomnia 04/18/2016  . Left retinal detachment 03/18/2015  . Loose body of right shoulder   . 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 Pseudophakia of left eye 05/22/2013  . Pseudophakia of right eye 06/05/2013  .  S/P coronary angioplasty   . Streptococcal bacteremia    Past Surgical History:  Procedure Laterality Date  . CARDIOVASCULAR STRESS TEST  08/22/2008  . CORONARY ANGIOPLASTY  1992  . ERCP W/ SPHICTEROTOMY  12/15/2002  . LAPAROSCOPIC CHOLECYSTECTOMY  12/16/2002  . LUMBAR LAMINECTOMY     L5  . TEE WITHOUT CARDIOVERSION N/A 02/28/2016   Procedure: TRANSESOPHAGEAL ECHOCARDIOGRAM (TEE);  Surgeon: Fay Records, MD;  Location: [facility] ENDOSCOPY;  Service: Cardiovascular;   Laterality: N/A;     Current Meds  Medication Sig  . Alirocumab (PRALUENT) 150 MG/ML SOAJ Inject 1 pen into the skin every 14 (fourteen) days.  Marland Kitchen amiodarone (PACERONE) 200 MG tablet TAKE 1 TABLET BY MOUTH EVERY DAY  . ketoconazole (NIZORAL) 2 % shampoo APPLY 1 3 TIMES A WEEK TO SCALP. LET SIT 10 MIN THEN RINSE OUT.  Marland Kitchen lisinopril (PRINIVIL,ZESTRIL) 10 MG tablet TAKE 1 TABLET(10 MG) BY MOUTH DAILY  . Meclizine HCl 25 MG CHEW Chew 1 tablet (25 mg total) by mouth daily.  . metoprolol succinate (TOPROL-XL) 25 MG 24 hr tablet TAKE 1/2 TABLET BY MOUTH DAILY  . Polyvinyl Alcohol (LIQUID TEARS OP) Place 1 drop into both eyes daily as needed (DRY EYE).  . valACYclovir (VALTREX) 1000 MG tablet TAKE 2 TABLETS BY MOUTH TWICE A DAY AS NEEDED FOR FLARES     Allergies:   Eliquis [apixaban]; Food; Glutamic acid; Ciprofloxacin; and Pollen extract   Social History   Tobacco Use  . Smoking status: Former Smoker    Packs/day: 2.00    Years: 20.00    Pack years: 40.00    Types: Cigarettes    Last attempt to quit: 03/27/1990    Years since quitting: 28.2  . Smokeless tobacco: Never Used  Substance Use Topics  . Alcohol use: Not Currently    Comment: Quit drinking this year   . Drug use: No    Family Hx: The patient's family history includes Emphysema in his unknown relative; Heart attack (age of onset: 36) in his father; Heart disease in his maternal grandfather and paternal grandfather; Other in his brother and mother.  ROS:   Please see the history of present illness.     All other systems reviewed and are negative.  Prior CV studies:   The following studies were reviewed today:  Echocardiogram 03/11/2016: Study Conclusions  - Left ventricle: The cavity size was normal. Wall thickness was   increased in a pattern of mild LVH. The estimated ejection   fraction was 55%. Wall motion was normal; there were no regional   wall motion abnormalities. Doppler parameters are consistent with    abnormal left ventricular relaxation (grade 1 diastolic   dysfunction). - Aortic valve: There was no stenosis. There was trivial   regurgitation. - Mitral valve: There was no significant regurgitation. - Right ventricle: The cavity size was normal. Systolic function   was normal. - Pulmonary arteries: No complete TR doppler jet so unable to   estimate PA systolic pressure. - Inferior vena cava: The vessel was normal in size. The   respirophasic diameter changes were in the normal range (= 50%),   consistent with normal central venous pressure.  Impressions:  - Normal LV size with mild LV hypertrophy. EF 55%. Normal RV size   and systolic function. No significant valvular abnormalities.  Labs/Other Tests and Data Reviewed:    EKG:  An ECG dated 05/12/2017 was personally reviewed today and demonstrated:  NSR  Recent Labs: 12/14/2017: ALT 17; BUN  15; Creatinine, Ser 1.21; Potassium 4.4; Sodium 136; TSH 2.160   Recent Lipid Panel Lab Results  Component Value Date/Time   CHOL 153 09/01/2017 12:44 PM   TRIG 210 (H) 09/01/2017 12:44 PM   HDL 35 (L) 09/01/2017 12:44 PM   CHOLHDL 4.4 09/01/2017 12:44 PM   CHOLHDL 2 10/22/2014 04:31 PM   LDLCALC 76 09/01/2017 12:44 PM   LDLDIRECT 33 05/08/2016 11:28 AM   LDLDIRECT 143.5 12/06/2012 09:09 AM    Wt Readings from Last 3 Encounters:  06/09/18 210 lb (95.3 kg)  12/14/17 204 lb (92.5 kg)  12/10/17 205 lb (93 kg)     Objective:    Vital Signs:  BP (!) 154/87   Pulse 75   Ht 6\' 3"  (1.905 m)   Wt 210 lb (95.3 kg)   BMI 26.25 kg/m    VITAL SIGNS:  reviewed GEN:  No distress  RESPIRATORY:  normal respiratory effort, symmetric expansion NEURO:  alert and oriented x 3, no obvious focal deficit PSYCH:  normal affect  ASSESSMENT & PLAN:    1.  Paroxysmal atrial fibrillation: -Patient maintained on low-dose amiodarone 200 mg/day>>> per last office visit note, consider reducing to 100 mg/day>> given that he is asymptomatic, will  reduce amiodarone to 100 mg/day with close follow-up -Reports regular eye exams at [facility] -TSH from 12/14/2017, 2.160 -Needs more up-to-date CXR (AP/lateral) -Most recent AST/ALT, 18/17 on 12/14/2016 -No anticoagulation in the setting of GI bleeding on Eliquis in the past -We will repeat TSH and LFTs today as well as CXR  2.  CAD without angina: -Denies anginal symptoms -No need for NTG -Physical activity to at least 150 minutes of aerobic activity per week  3.  Hyperlipidemia: -Last LDL, 76 on 09/01/2017 -Continue Praluent per lipid clinic>> tolerating -Patient was approved for Praluent through 01/19/2019  4.  Amiodarone therapy: -Last TSH, AST/ALT stable -We will obtain repeat labs as well as CXR  5.  Chronic diastolic HF: -Denies SOB or orthopnea symptoms -Continue lisinopril 10 mg, Toprol XL 12.5 mg daily   COVID-19 Education: The signs and symptoms of COVID-19 were discussed with the patient and how to seek care for testing (follow up with PCP or arrange E-visit). The importance of social distancing was discussed today.  Time:   Today, I have spent 20 minutes with the patient with telehealth technology discussing the above problems.     Medication Adjustments/Labs and Tests Ordered: Current medicines are reviewed at length with the patient today.  Concerns regarding medicines are outlined above.   Tests Ordered: No orders of the defined types were placed in this encounter.   Medication Changes: No orders of the defined types were placed in this encounter.   Disposition:  Follow up Dr. Tamala Julian in 3 months or sooner  Signed, [name], NP  06/09/2018 9:26 AM    [place]

## 2018-06-09 ENCOUNTER — Encounter: Payer: Self-pay | Admitting: Cardiology

## 2018-06-09 ENCOUNTER — Other Ambulatory Visit: Payer: Self-pay

## 2018-06-09 ENCOUNTER — Telehealth (INDEPENDENT_AMBULATORY_CARE_PROVIDER_SITE_OTHER): Payer: Medicare Other | Admitting: Cardiology

## 2018-06-09 VITALS — BP 154/87 | HR 75 | Ht 75.0 in | Wt 210.0 lb

## 2018-06-09 DIAGNOSIS — Z79899 Other long term (current) drug therapy: Secondary | ICD-10-CM

## 2018-06-09 DIAGNOSIS — E785 Hyperlipidemia, unspecified: Secondary | ICD-10-CM

## 2018-06-09 DIAGNOSIS — I10 Essential (primary) hypertension: Secondary | ICD-10-CM

## 2018-06-09 DIAGNOSIS — I5032 Chronic diastolic (congestive) heart failure: Secondary | ICD-10-CM

## 2018-06-09 DIAGNOSIS — I25118 Atherosclerotic heart disease of native coronary artery with other forms of angina pectoris: Secondary | ICD-10-CM

## 2018-06-09 DIAGNOSIS — I480 Paroxysmal atrial fibrillation: Secondary | ICD-10-CM

## 2018-06-09 MED ORDER — AMIODARONE HCL 100 MG PO TABS: 100.0000 mg | ORAL_TABLET | Freq: Every day | ORAL | 3 refills | Status: DC

## 2018-06-09 NOTE — Patient Instructions (Addendum)
Medication Instructions:  DECREASE: Amiodarone to 100 mg once a day   If you need a refill on your cardiac medications before your next appointment, please call your pharmacy.   Lab work: FUTURE:TSH, LFT & LIPIDS ON 06/14/2018   If you have labs (blood work) drawn today and your tests are completely normal, you will receive your results only by: Marland Kitchen MyChart Message (if you have MyChart) OR . A paper copy in the mail If you have any lab test that is abnormal or we need to change your treatment, we will call you to review the results.  Testing/Procedures: A chest x-ray takes a picture of the organs and structures inside the chest, including the heart, lungs, and blood vessels. This test can show several things, including, whether the heart is enlarges; whether fluid is building up in the lungs; and whether pacemaker / defibrillator leads are still in place.  Follow-Up: At [facility], you and your health needs are our priority.  As part of our continuing mission to provide you with exceptional heart care, we have created designated Provider Care Teams.  These Care Teams include your primary Cardiologist (physician) and Advanced Practice Providers (APPs -  Physician Assistants and Nurse Practitioners) who all work together to provide you with the care you need, when you need it. You will need a follow up appointment in 3 months.  Please call our office 2 months in advance to schedule this appointment.  You may see Sinclair Grooms, MD or one of the following Advanced Practice Providers on your designated Care Team:   Truitt Merle, NP Cecilie Kicks, NP . [name], NP  Any Other Special Instructions Will Be Listed Below (If Applicable).  Please keep a 2 week log of your blood pressures and call us with the readings

## 2018-06-14 ENCOUNTER — Ambulatory Visit: Payer: Medicare Other | Admitting: Interventional Cardiology

## 2018-06-14 ENCOUNTER — Other Ambulatory Visit: Payer: Medicare Other

## 2018-06-15 ENCOUNTER — Telehealth: Payer: Self-pay | Admitting: Interventional Cardiology

## 2018-06-15 NOTE — Telephone Encounter (Signed)
New Message    Pt is calling and wanting to schedule his xray for tomorrow    Please calll

## 2018-06-15 NOTE — Telephone Encounter (Signed)
Called patient back. Informed him to go to [facility] for chest xray. Informed patient that he does not need an appointment. Patient verbalized understanding and will go tomorrow.

## 2018-06-16 ENCOUNTER — Ambulatory Visit
Admission: RE | Admit: 2018-06-16 | Discharge: 2018-06-16 | Disposition: A | Discharge status: Home / Self Care | Payer: Medicare Other | Source: Ambulatory Visit | Attending: Cardiology | Admitting: Cardiology

## 2018-06-16 ENCOUNTER — Other Ambulatory Visit: Payer: Medicare Other | Admitting: *Deleted

## 2018-06-16 ENCOUNTER — Other Ambulatory Visit: Payer: Self-pay

## 2018-06-16 DIAGNOSIS — E785 Hyperlipidemia, unspecified: Secondary | ICD-10-CM

## 2018-06-16 DIAGNOSIS — I5032 Chronic diastolic (congestive) heart failure: Secondary | ICD-10-CM | POA: Diagnosis not present

## 2018-06-16 DIAGNOSIS — I480 Paroxysmal atrial fibrillation: Secondary | ICD-10-CM

## 2018-06-16 DIAGNOSIS — Z79899 Other long term (current) drug therapy: Secondary | ICD-10-CM

## 2018-06-16 DIAGNOSIS — I25118 Atherosclerotic heart disease of native coronary artery with other forms of angina pectoris: Secondary | ICD-10-CM

## 2018-06-16 LAB — HEPATIC FUNCTION PANEL
ALT: 17 IU/L (ref 0–44)
AST: 23 IU/L (ref 0–40)
Albumin: 4.6 g/dL (ref 3.7–4.7)
Alkaline Phosphatase: 81 IU/L (ref 39–117)
Bilirubin Total: 0.3 mg/dL (ref 0.0–1.2)
Bilirubin, Direct: 0.1 mg/dL (ref 0.00–0.40)
Total Protein: 6.8 g/dL (ref 6.0–8.5)

## 2018-06-16 LAB — LIPID PANEL
Chol/HDL Ratio: 4.3 ratio (ref 0.0–5.0)
Cholesterol, Total: 143 mg/dL (ref 100–199)
HDL: 33 mg/dL — ABNORMAL LOW (ref >39)
LDL Calculated: 77 mg/dL (ref 0–99)
Triglycerides: 164 mg/dL — ABNORMAL HIGH (ref 0–149)
VLDL Cholesterol Cal: 33 mg/dL (ref 5–40)

## 2018-06-16 LAB — TSH: TSH: 1.59 u[IU]/mL (ref 0.450–4.500)

## 2018-07-12 ENCOUNTER — Telehealth: Payer: Self-pay | Admitting: Interventional Cardiology

## 2018-07-12 NOTE — Telephone Encounter (Signed)
New message   Patient states that he has b/p results to report. Please call to discuss.

## 2018-07-12 NOTE — Telephone Encounter (Signed)
Pt was seen for a virtual visit in May and told to call back with BPs.  Pt currently takes Lisinopril 10mg  and Metoprolol Succ 12.5mg  at night.  Checks BPs at the same time he takes his meds and it is typically 140s-160s/80-90.  During the day, BP is usually 120-140/70s.  HR always 60s-70s.  Two weeks ago he had episode of lightheadedness and BP was 87/50, few mins later 85/58.  On one other incident BP was 92/56.  Pt takes [employer] for vertigo like sx.  Advised I will send message to Dr. Tamala Julian for review.  Pt appreciative for call.

## 2018-07-14 NOTE — Telephone Encounter (Signed)
Generally, blood pressures are acceptable.  No recommendations.  Continue same therapy.

## 2018-07-14 NOTE — Telephone Encounter (Signed)
Spoke with pt and made him aware of information from Dr. Tamala Julian.  Pt verbalized understanding and was in agreement with this plan.

## 2018-09-20 ENCOUNTER — Telehealth: Payer: Self-pay

## 2018-09-20 NOTE — Telephone Encounter (Signed)
Called to change patients in office visit (09/22/2018) to virtual visit and patient stated he does not want another virtual visit. He stated that he feels fine with no complaints and his blood pressure is running fine and he checks it daily. He would like to be rescheduled to when he can have a face to face in person office visit with Dr Tamala Julian.

## 2018-09-21 NOTE — Telephone Encounter (Signed)
Left message to call back  

## 2018-09-22 ENCOUNTER — Ambulatory Visit: Payer: Medicare Other | Admitting: Interventional Cardiology

## 2018-09-27 NOTE — Telephone Encounter (Signed)
Spoke with pt and scheduled him to see Dr. Tamala Julian 10/7.  Pt appreciative for call.

## 2018-10-09 ENCOUNTER — Other Ambulatory Visit: Payer: Self-pay | Admitting: Interventional Cardiology

## 2018-10-25 NOTE — Progress Notes (Signed)
Cardiology Office Note:    Date:  10/26/2018   ID:  Jonathan Holden, DOB 1945-06-23, MRN [id number]  PCP:  Briscoe Deutscher, DO  Cardiologist:  Sinclair Grooms, MD   Referring MD: Briscoe Deutscher, DO   Chief Complaint  Patient presents with  . Coronary Artery Disease  . Atrial Fibrillation  . Congestive Heart Failure    History of Present Illness:    Jonathan Holden is a 73 y.o. male with a hx of coronary artery disease, right coronary angioplasty during acute infarction, history of gastric ulcer, recurrent diverticular colonic bleeding on Eliquis, paroxysmal atrial fibrillation with rhythm control on Amiodarone, lumbar discitis with streptococcal bacteremia, hyperlipidemia, and hypertension.  Jonathan Holden feels well.  He has downwardly adjusted lisinopril to 5 mg/day and episodes of lightheadedness that occurred 2 or 3 times a week have completely resolved.  He denies angina.  There is no orthopnea PND.  He denies lower extremity edema.  He does not feel he has had atrial fibrillation.  Appetite is been stable.  There is no difficulty with breathing.  Past Medical History:  Diagnosis Date  . Acute blood loss anemia   . Acute lower GI bleeding 03/19/2016  . Arthritis   . Atrial fibrillation with RVR ([place]) 02/21/2016  . Coronary artery disease   . Diverticulosis 04/18/2016  . Endocarditis of native valve   . Former smoker   . H/O gastric ulcer   . History of MI (myocardial infarction) 1992  . Hyperlipidemia   . Hypertension   . Infection of intervertebral disc ([place])   . Insomnia 04/18/2016  . Left retinal detachment 03/18/2015  . Loose body of right shoulder   . 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 Pseudophakia of left eye 05/22/2013  . Pseudophakia of right eye 06/05/2013  . S/P coronary angioplasty   . Streptococcal bacteremia     Past Surgical History:  Procedure Laterality Date  . CARDIOVASCULAR STRESS TEST  08/22/2008  . CORONARY ANGIOPLASTY  1992  .  ERCP W/ SPHICTEROTOMY  12/15/2002  . LAPAROSCOPIC CHOLECYSTECTOMY  12/16/2002  . LUMBAR LAMINECTOMY     L5  . TEE WITHOUT CARDIOVERSION N/A 02/28/2016   Procedure: TRANSESOPHAGEAL ECHOCARDIOGRAM (TEE);  Surgeon: Fay Records, MD;  Location: [facility] ENDOSCOPY;  Service: Cardiovascular;  Laterality: N/A;    Current Medications: Current Meds  Medication Sig  . Alirocumab (PRALUENT) 150 MG/ML SOAJ Inject 1 pen into the skin every 14 (fourteen) days.  Marland Kitchen amiodarone (PACERONE) 100 MG tablet Take 1 tablet (100 mg total) by mouth daily.  Marland Kitchen ketoconazole (NIZORAL) 2 % shampoo APPLY 1 3 TIMES A WEEK TO SCALP. LET SIT 10 MIN THEN RINSE OUT.  Marland Kitchen lisinopril (ZESTRIL) 10 MG tablet Take 5 mg by mouth daily.  . Meclizine HCl 25 MG CHEW Chew 1 tablet (25 mg total) by mouth daily.  . metoprolol succinate (TOPROL-XL) 25 MG 24 hr tablet TAKE 1/2 TABLET BY MOUTH EVERY DAY  . Polyvinyl Alcohol (LIQUID TEARS OP) Place 1 drop into both eyes daily as needed (DRY EYE).  . valACYclovir (VALTREX) 1000 MG tablet TAKE 2 TABLETS BY MOUTH TWICE A DAY AS NEEDED FOR FLARES     Allergies:   Eliquis [apixaban], Food, Glutamic acid, Ciprofloxacin, and Pollen extract   Social History   Socioeconomic History  . Marital status: Divorced    Spouse name: Not on file  . Number of children: Not on file  . Years of education: Not on file  .  Highest education level: Not on file  Occupational History  . Not on file  Social Needs  . Financial resource strain: Not on file  . Food insecurity    Worry: Not on file    Inability: Not on file  . Transportation needs    Medical: Not on file    Non-medical: Not on file  Tobacco Use  . Smoking status: Former Smoker    Packs/day: 2.00    Years: 20.00    Pack years: 40.00    Types: Cigarettes    Quit date: 03/27/1990    Years since quitting: 28.6  . Smokeless tobacco: Never Used  Substance and Sexual Activity  . Alcohol use: Not Currently    Comment: Quit drinking this year   . Drug  use: No  . Sexual activity: Not Currently  Lifestyle  . Physical activity    Days per week: Not on file    Minutes per session: Not on file  . Stress: Not on file  Relationships  . Social Herbalist on phone: Not on file    Gets together: Not on file    Attends religious service: Not on file    Active member of club or organization: Not on file    Attends meetings of clubs or organizations: Not on file    Relationship status: Not on file  Other Topics Concern  . Not on file  Social History Narrative  . Not on file     Family History: The patient's family history includes Emphysema in his unknown relative; Heart attack (age of onset: 46) in his father; Heart disease in his maternal grandfather and paternal grandfather; Other in his brother and mother.  ROS:   Please see the history of present illness.    His back is doing well.  He stopped drinking alcohol.  He is gone to a vegetarian diet.  All other systems reviewed and are negative.  EKGs/Labs/Other Studies Reviewed:    The following studies were reviewed today: No new data  EKG:  EKG sinus rhythm, normal EKG appearance.  Recent Labs: 12/14/2017: BUN 15; Creatinine, Ser 1.21; Potassium 4.4; Sodium 136 06/16/2018: ALT 17; TSH 1.590  Recent Lipid Panel    Component Value Date/Time   CHOL 143 06/16/2018 1011   TRIG 164 (H) 06/16/2018 1011   HDL 33 (L) 06/16/2018 1011   CHOLHDL 4.3 06/16/2018 1011   CHOLHDL 2 10/22/2014 1631   VLDL 22.2 10/22/2014 1631   LDLCALC 77 06/16/2018 1011   LDLDIRECT 33 05/08/2016 1128   LDLDIRECT 143.5 12/06/2012 0909    Physical Exam:    VS:  BP 132/82   Pulse 69   Ht 6\' 3"  (1.905 m)   Wt 206 lb 6.4 oz (93.6 kg)   SpO2 98%   BMI 25.80 kg/m     Wt Readings from Last 3 Encounters:  10/26/18 206 lb 6.4 oz (93.6 kg)  06/09/18 210 lb (95.3 kg)  12/14/17 204 lb (92.5 kg)     GEN: Healthy-appearing. No acute distress HEENT: Normal NECK: No JVD. LYMPHATICS: No  lymphadenopathy CARDIAC:  RRR without murmur, gallop, or edema. VASCULAR:  Normal Pulses. No bruits. RESPIRATORY:  Clear to auscultation without rales, wheezing or rhonchi  ABDOMEN: Soft, non-tender, non-distended, No pulsatile mass, MUSCULOSKELETAL: No deformity  SKIN: Warm and dry NEUROLOGIC:  Alert and oriented x 3 PSYCHIATRIC:  Normal affect   ASSESSMENT:    1. Paroxysmal atrial fibrillation with RVR ([place])   2. On  amiodarone therapy   3. Coronary artery disease of native artery of native heart with stable angina pectoris ([place])   4. Essential hypertension   5. Hyperlipidemia LDL goal <70, on Praluent   6. Chronic diastolic HF (heart failure) ([place])   7. Educated about COVID-19 virus infection    PLAN:    In order of problems listed above:  1. Low-dose amiodarone is suppressing atrial fibrillation. 2. TSH and liver panel will be done in March when he returns. 3. Secondary prevention discussed in detail. 4. Target 130/80 mmHg.  Today's blood pressure is before any anti-hypertensive therapy. 5. Aggressive LDL lowering using PCSK9 therapy.  I would love an LDL less than 55 if possible. 6. No volume overload or dyspnea. 7. The 3W's are discussed and affirmed by the patient has lifestyle practices.  Overall education and awareness concerning primary/secondary risk prevention was discussed in detail: LDL less than 70, hemoglobin A1c less than 7, blood pressure target less than 130/80 mmHg, >150 minutes of moderate aerobic activity per week, avoidance of smoking, weight control (via diet and exercise), and continued surveillance/management of/for obstructive sleep apnea.    Medication Adjustments/Labs and Tests Ordered: Current medicines are reviewed at length with the patient today.  Concerns regarding medicines are outlined above.  Orders Placed This Encounter  Procedures  . EKG 12-Lead   No orders of the defined types were placed in this encounter.   Patient Instructions   Medication Instructions:  Your physician recommends that you continue on your current medications as directed. Please refer to the Current Medication list given to you today.  If you need a refill on your cardiac medications before your next appointment, please call your pharmacy.   Lab work: None If you have labs (blood work) drawn today and your tests are completely normal, you will receive your results only by: Marland Kitchen MyChart Message (if you have MyChart) OR . A paper copy in the mail If you have any lab test that is abnormal or we need to change your treatment, we will call you to review the results.  Testing/Procedures: None  Follow-Up: At [facility], you and your health needs are our priority.  As part of our continuing mission to provide you with exceptional heart care, we have created designated Provider Care Teams.  These Care Teams include your primary Cardiologist (physician) and Advanced Practice Providers (APPs -  Physician Assistants and Nurse Practitioners) who all work together to provide you with the care you need, when you need it. You will need a follow up appointment in 5 months.  Please call our office 2 months in advance to schedule this appointment.  You may see Sinclair Grooms, MD or one of the following Advanced Practice Providers on your designated Care Team:   Truitt Merle, NP Cecilie Kicks, NP . [name], NP  Any Other Special Instructions Will Be Listed Below (If Applicable).       Signed, Sinclair Grooms, MD  10/26/2018 9:11 AM    [place]

## 2018-10-26 ENCOUNTER — Ambulatory Visit (INDEPENDENT_AMBULATORY_CARE_PROVIDER_SITE_OTHER): Payer: Medicare Other

## 2018-10-26 ENCOUNTER — Other Ambulatory Visit: Payer: Self-pay

## 2018-10-26 ENCOUNTER — Encounter: Payer: Self-pay | Admitting: Interventional Cardiology

## 2018-10-26 ENCOUNTER — Encounter: Payer: Self-pay | Admitting: Family Medicine

## 2018-10-26 ENCOUNTER — Ambulatory Visit (INDEPENDENT_AMBULATORY_CARE_PROVIDER_SITE_OTHER): Payer: Medicare Other | Admitting: Interventional Cardiology

## 2018-10-26 VITALS — BP 132/82 | HR 69 | Ht 75.0 in | Wt 206.4 lb

## 2018-10-26 DIAGNOSIS — I5032 Chronic diastolic (congestive) heart failure: Secondary | ICD-10-CM | POA: Diagnosis not present

## 2018-10-26 DIAGNOSIS — Z23 Encounter for immunization: Secondary | ICD-10-CM

## 2018-10-26 DIAGNOSIS — E785 Hyperlipidemia, unspecified: Secondary | ICD-10-CM

## 2018-10-26 DIAGNOSIS — I25118 Atherosclerotic heart disease of native coronary artery with other forms of angina pectoris: Secondary | ICD-10-CM

## 2018-10-26 DIAGNOSIS — Z7189 Other specified counseling: Secondary | ICD-10-CM

## 2018-10-26 DIAGNOSIS — Z79899 Other long term (current) drug therapy: Secondary | ICD-10-CM | POA: Diagnosis not present

## 2018-10-26 DIAGNOSIS — I10 Essential (primary) hypertension: Secondary | ICD-10-CM

## 2018-10-26 DIAGNOSIS — I480 Paroxysmal atrial fibrillation: Secondary | ICD-10-CM | POA: Diagnosis not present

## 2018-10-26 NOTE — Patient Instructions (Addendum)
Medication Instructions:  Your physician recommends that you continue on your current medications as directed. Please refer to the Current Medication list given to you today.  If you need a refill on your cardiac medications before your next appointment, please call your pharmacy.   Lab work: None If you have labs (blood work) drawn today and your tests are completely normal, you will receive your results only by: Marland Kitchen MyChart Message (if you have MyChart) OR . A paper copy in the mail If you have any lab test that is abnormal or we need to change your treatment, we will call you to review the results.  Testing/Procedures: None  Follow-Up: At [facility] you and your health needs are our priority.  As part of our continuing mission to provide you with exceptional heart care, we have created designated Provider Care Teams.  These Care Teams include your primary Cardiologist (physician) and Advanced Practice Providers (APPs -  Physician Assistants and Nurse Practitioners) who all work together to provide you with the care you need, when you need it. You will need a follow up appointment in 5 months.  Please call our office 2 months in advance to schedule this appointment.  You may see Sinclair Grooms, MD or one of the following Advanced Practice Providers on your designated Care Team:   Truitt Merle, NP Cecilie Kicks, NP . [name], NP  Any Other Special Instructions Will Be Listed Below (If Applicable).

## 2018-11-30 NOTE — Telephone Encounter (Signed)
Did not need this encounter °

## 2018-12-05 ENCOUNTER — Encounter: Payer: Medicare Other | Admitting: Family Medicine

## 2018-12-13 ENCOUNTER — Telehealth: Payer: Self-pay | Admitting: Interventional Cardiology

## 2018-12-13 NOTE — Telephone Encounter (Signed)
Returned call to patient - he has questions about Praluent patient assistance. 1 in household, 50k in income this year. Pt states he has 2 more Praluent shots left. He will call clinic when he uses his last pen - advised him we can apply for [employer] for next year.

## 2018-12-13 NOTE — Telephone Encounter (Signed)
Patient has a question for [employer]. Asking that she give him a call at her earliest convenience. Would not say what the question was.

## 2018-12-27 ENCOUNTER — Telehealth: Payer: Self-pay | Admitting: Interventional Cardiology

## 2018-12-27 NOTE — Telephone Encounter (Signed)
Call was already transferred to me, pt wondering where and when he could receive COVID vaccine. Advised him we do not have this information available yet.

## 2018-12-27 NOTE — Telephone Encounter (Signed)
New Message    Pt would like to speak with Jinny Blossom the pharmacist   Please call back

## 2019-01-29 ENCOUNTER — Other Ambulatory Visit: Payer: Self-pay | Admitting: Interventional Cardiology

## 2019-02-08 ENCOUNTER — Telehealth: Payer: Self-pay

## 2019-02-08 NOTE — Telephone Encounter (Signed)
lmomed the pt to call us back with insurance information

## 2019-02-10 NOTE — Telephone Encounter (Signed)
Prior auth approved, called pt who has 3 shots left (was in PASS program last year). He will call when he uses his last shot so that we can apply for [employer].

## 2019-02-21 MED ORDER — PRALUENT 150 MG/ML ~~LOC~~ SOAJ: 1.0000 "pen " | SUBCUTANEOUS | 3 refills | Status: DC

## 2019-02-21 NOTE — Telephone Encounter (Signed)
Pt called clinic for [employer] aid - grant has been submitted and approved. Info sent to to pharmacy, confirmed $0 copay.

## 2019-02-21 NOTE — Addendum Note (Signed)
Addended by: Kayleena Eke E on: 02/21/2019 01:59 PM   Modules accepted: Orders

## 2019-02-27 ENCOUNTER — Ambulatory Visit: Payer: Medicare Other | Attending: Internal Medicine

## 2019-02-27 DIAGNOSIS — Z23 Encounter for immunization: Secondary | ICD-10-CM

## 2019-02-27 NOTE — Progress Notes (Signed)
   Covid-19 Vaccination Clinic  Name:  Jonathan Holden    MRN: [id number] DOB: 26-Jul-1945  02/27/2019  [name] was observed post Covid-19 immunization for 15 minutes without incidence. He was provided with Vaccine Information Sheet and instruction to access the V-Safe system.   [name] was instructed to call 911 with any severe reactions post vaccine: Marland Kitchen Difficulty breathing  . Swelling of your face and throat  . A fast heartbeat  . A bad rash all over your body  . Dizziness and weakness    Immunizations Administered    Name Date Dose VIS Date Route   Pfizer COVID-19 Vaccine 02/27/2019  2:45 PM 0.3 mL 12/30/2018 Intramuscular   Manufacturer: [place]   Lot: [id number]   [place]: [id number]

## 2019-03-07 ENCOUNTER — Other Ambulatory Visit: Payer: Self-pay

## 2019-03-09 ENCOUNTER — Encounter: Payer: Medicare Other | Admitting: Family Medicine

## 2019-03-24 ENCOUNTER — Ambulatory Visit: Payer: Medicare Other | Attending: Internal Medicine

## 2019-03-24 DIAGNOSIS — Z23 Encounter for immunization: Secondary | ICD-10-CM | POA: Insufficient documentation

## 2019-03-24 NOTE — Progress Notes (Signed)
   Covid-19 Vaccination Clinic  Name:  Jonathan Holden    MRN: [id number] DOB: 1945/06/27  03/24/2019  [name] was observed post Covid-19 immunization for 15 minutes without incident. He was provided with Vaccine Information Sheet and instruction to access the V-Safe system.   [name] was instructed to call 911 with any severe reactions post vaccine: Marland Kitchen Difficulty breathing  . Swelling of face and throat  . A fast heartbeat  . A bad rash all over body  . Dizziness and weakness   Immunizations Administered    Name Date Dose VIS Date Route   Pfizer COVID-19 Vaccine 03/24/2019 10:12 AM 0.3 mL 12/30/2018 Intramuscular   Manufacturer: [employer]   Lot: [id number]   [place]: [id number]

## 2019-05-10 ENCOUNTER — Telehealth: Payer: Self-pay | Admitting: Interventional Cardiology

## 2019-05-10 NOTE — Telephone Encounter (Signed)
Reiterated to the patient that Dr. Tamala Julian planned to have TSH and liver tests drawn at visit. He will come fasting as he would like his cholesterol checked as well (it has been almost a year). He was grateful for assistance.

## 2019-05-10 NOTE — Telephone Encounter (Signed)
Patient has an appointment scheduled tomorrow with [name]. The appointment notes state he needs lab work, but there are not orders.

## 2019-05-10 NOTE — Progress Notes (Addendum)
Cardiology Office Note   Date:  05/11/2019   ID:  Jonathan Holden [birth year], MRN [id number]  PCP:  Jonathan Deutscher, DO  Cardiologist: Dr. Tamala Julian, MD   Chief Complaint  Patient presents with  . Follow-up    History of Present Illness: Jonathan Holden is a 74 y.o. male who presents for  follow-up, seen for Dr. Tamala Julian.  [name] has a history of coronary artery disease s/p PCI/DES to RCA, history of gastric ulcer, recurrent diverticular colonic bleeding while on Eliquis, paroxysmal atrial fibrillation on amiodarone, lumbar discitis with streptococcal bacteremia, hyperlipidemia and hypertension.  He was last seen by Dr. Tamala Julian in follow-up 10/26/2018 at which time he had self titrated his lisinopril down to 5 mg/day secondary to episodes of lightheadedness that were occurring approximately 2-3 times per week however had resolved at last visit.  [name] has been doing well from a CV standpoint. He has no specific complaints today. Continues to tend to his garden without issues other than arthritis. Reports that last year he decreased all of his medications in half with discussion with Dr. Tamala Julian. No further dizziness after doing this. He denies chest pain, palpitations, SOB, orthopnea, or syncope. We discussed the possibility of discontinuing Amio however will discuss case with Dr. Tamala Julian first.    Past Medical History:  Diagnosis Date  . Acute blood loss anemia   . Acute lower GI bleeding 03/19/2016  . Arthritis   . Atrial fibrillation with RVR ([place]) 02/21/2016  . Coronary artery disease   . Diverticulosis 04/18/2016  . Endocarditis of native valve   . Former smoker   . H/O gastric ulcer   . History of MI (myocardial infarction) 1992  . Hyperlipidemia   . Hypertension   . Infection of intervertebral disc ([place])   . Insomnia 04/18/2016  . Left retinal detachment 03/18/2015  . Loose body of right shoulder   . 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  Pseudophakia of left eye 05/22/2013  . Pseudophakia of right eye 06/05/2013  . S/P coronary angioplasty   . Streptococcal bacteremia     Past Surgical History:  Procedure Laterality Date  . CARDIOVASCULAR STRESS TEST  08/22/2008  . CORONARY ANGIOPLASTY  1992  . ERCP W/ SPHICTEROTOMY  12/15/2002  . LAPAROSCOPIC CHOLECYSTECTOMY  12/16/2002  . LUMBAR LAMINECTOMY     L5  . TEE WITHOUT CARDIOVERSION N/A 02/28/2016   Procedure: TRANSESOPHAGEAL ECHOCARDIOGRAM (TEE);  Surgeon: Fay Records, MD;  Location: [facility] ENDOSCOPY;  Service: Cardiovascular;  Laterality: N/A;     Current Outpatient Medications  Medication Sig Dispense Refill  . Alirocumab (PRALUENT) 150 MG/ML SOAJ Inject 1 pen into the skin every 14 (fourteen) days. 6 pen 3  . amiodarone (PACERONE) 100 MG tablet Take 50 mg by mouth daily.    Marland Kitchen ketoconazole (NIZORAL) 2 % shampoo APPLY 1 3 TIMES A WEEK TO SCALP. LET SIT 10 MIN THEN RINSE OUT.    Marland Kitchen lisinopril (ZESTRIL) 10 MG tablet Take 0.5 tablets (5 mg total) by mouth daily. 45 tablet 3  . Meclizine HCl 25 MG CHEW Chew 1 tablet (25 mg total) by mouth daily. 90 each 0  . metoprolol succinate (TOPROL-XL) 25 MG 24 hr tablet TAKE 1/2 TABLET BY MOUTH EVERY DAY 45 tablet 2  . Polyvinyl Alcohol (LIQUID TEARS OP) Place 1 drop into both eyes daily as needed (DRY EYE).    . valACYclovir (VALTREX) 1000 MG tablet TAKE 2 TABLETS BY MOUTH TWICE A  DAY AS NEEDED FOR FLARES     No current facility-administered medications for this visit.    Allergies:   Eliquis [apixaban], Food, Glutamic acid, Ciprofloxacin, and Pollen extract    Social History:  The patient  reports that he quit smoking about 29 years ago. His smoking use included cigarettes. He has a 40.00 pack-year smoking history. He has never used smokeless tobacco. He reports previous alcohol use. He reports that he does not use drugs.   Family History:  The patient's family history includes Emphysema in his unknown relative; Heart attack (age of  onset: 70) in his father; Heart disease in his maternal grandfather and paternal grandfather; Other in his brother and mother.    ROS:  Please see the history of present illness. Otherwise, review of systems are positive for none.   All other systems are reviewed and negative.    PHYSICAL EXAM: VS:  BP 126/72 (BP Location: Right Arm, Patient Position: Sitting, Cuff Size: Normal)   Pulse 72   Ht 6\' 3"  (1.905 m)   Wt 209 lb 12.8 oz (95.2 kg)   SpO2 97%   BMI 26.22 kg/m  , BMI Body mass index is 26.22 kg/m.   General: Well developed, well nourished, NAD Neck: Negative for carotid bruits. No JVD Lungs:Clear to ausculation bilaterally. No wheezes, rales, or rhonchi. Breathing is unlabored. Cardiovascular: RRR with S1 S2. No murmurs Extremities: No edema. Radial pulses 2+ bilaterally Neuro: Alert and oriented. No focal deficits. No facial asymmetry. MAE spontaneously. Psych: Responds to questions appropriately with normal affect.     EKG:  EKG is not ordered today.   Recent Labs: 06/16/2018: ALT 17; TSH 1.590    Lipid Panel    Component Value Date/Time   CHOL 143 06/16/2018 1011   TRIG 164 (H) 06/16/2018 1011   HDL 33 (L) 06/16/2018 1011   CHOLHDL 4.3 06/16/2018 1011   CHOLHDL 2 10/22/2014 1631   VLDL 22.2 10/22/2014 1631   LDLCALC 77 06/16/2018 1011   LDLDIRECT 33 05/08/2016 1128   LDLDIRECT 143.5 12/06/2012 0909      Wt Readings from Last 3 Encounters:  05/11/19 209 lb 12.8 oz (95.2 kg)  10/26/18 206 lb 6.4 oz (93.6 kg)  06/09/18 210 lb (95.3 kg)    Other studies Reviewed: Additional studies/ records that were reviewed today include:   Echocardiogram 03/11/2016: Study Conclusions  - Left ventricle: The cavity size was normal. Wall thickness was increased in a pattern of mild LVH. The estimated ejection fraction was 55%. Wall motion was normal; there were no regional wall motion abnormalities. Doppler parameters are consistent with abnormal left  ventricular relaxation (grade 1 diastolic dysfunction). - Aortic valve: There was no stenosis. There was trivial regurgitation. - Mitral valve: There was no significant regurgitation. - Right ventricle: The cavity size was normal. Systolic function was normal. - Pulmonary arteries: No complete TR doppler jet so unable to estimate PA systolic pressure. - Inferior vena cava: The vessel was normal in size. The respirophasic diameter changes were in the normal range (= 50%), consistent with normal central venous pressure.  Impressions:  - Normal LV size with mild LV hypertrophy. EF 55%. Normal RV size and systolic function. No significant valvular abnormalities.  ASSESSMENT AND PLAN:  1.  Paroxysmal atrial fibrillation: -Denies palpitations, shortness of breath -On low-dose amiodarone>>reports decreasing to 50mg  PO QD secondary to dizziness which has resolved.  -HR stable today -Will discuss if pt may discontinue Amio therapy given no recurrence of AF and  small dose?  -Needs TSH, liver panel today -[facility] exams at [facility] -Not on anticoagulation secondary to previous GI bleed  2.  CAD status post PCI/DES to RCA: -Denies anginal symptoms -Continue secondary prevention -Continue ASA, beta-blocker, Praluent   3.  Hypertension: -Stable, 126/72 -Continue current regimen  -Reports only taking 1/5 doses of current medications  -Lisinopril 2.5, Amio 50, Toprol 12.5   4.  Hyperlipidemia: -Last LDL, 77 on 06/16/2018 -Currently on Praluent with an LDL goal of <70 -Goal per Dr. Tamala Julian LDL <55 -Check lab work today  5.  Chronic diastolic heart failure: -Appears euvolemic on exam -No SOB, LE edema    Current medicines are reviewed at length with the patient today.  The patient does not have concerns regarding medicines.  The following changes have been made:  no change  Labs/ tests ordered today include: CMET, Lipid, TSH No orders of the defined types were placed in  this encounter.    Disposition:   FU with Dr. Tamala Julian in 9 months  Signed, [name], NP  05/11/2019 9:06 AM    [address], [place], [place]  [address] Phone: [phone]; Fax: [phone]

## 2019-05-11 ENCOUNTER — Other Ambulatory Visit: Payer: Self-pay

## 2019-05-11 ENCOUNTER — Ambulatory Visit (INDEPENDENT_AMBULATORY_CARE_PROVIDER_SITE_OTHER): Payer: Medicare Other | Admitting: Cardiology

## 2019-05-11 ENCOUNTER — Encounter: Payer: Self-pay | Admitting: Cardiology

## 2019-05-11 VITALS — BP 126/72 | HR 72 | Ht 75.0 in | Wt 209.8 lb

## 2019-05-11 DIAGNOSIS — I480 Paroxysmal atrial fibrillation: Secondary | ICD-10-CM | POA: Diagnosis not present

## 2019-05-11 DIAGNOSIS — I10 Essential (primary) hypertension: Secondary | ICD-10-CM | POA: Diagnosis not present

## 2019-05-11 DIAGNOSIS — I25118 Atherosclerotic heart disease of native coronary artery with other forms of angina pectoris: Secondary | ICD-10-CM

## 2019-05-11 DIAGNOSIS — E785 Hyperlipidemia, unspecified: Secondary | ICD-10-CM

## 2019-05-11 DIAGNOSIS — Z79899 Other long term (current) drug therapy: Secondary | ICD-10-CM

## 2019-05-11 LAB — COMPREHENSIVE METABOLIC PANEL
ALT: 13 IU/L (ref 0–44)
AST: 18 IU/L (ref 0–40)
Albumin/Globulin Ratio: 1.9 (ref 1.2–2.2)
Albumin: 4.6 g/dL (ref 3.7–4.7)
Alkaline Phosphatase: 93 IU/L (ref 39–117)
BUN/Creatinine Ratio: 16 (ref 10–24)
BUN: 15 mg/dL (ref 8–27)
Bilirubin Total: 0.6 mg/dL (ref 0.0–1.2)
CO2: 24 mmol/L (ref 20–29)
Calcium: 10 mg/dL (ref 8.6–10.2)
Chloride: 100 mmol/L (ref 96–106)
Creatinine, Ser: 0.95 mg/dL (ref 0.76–1.27)
GFR calc Af Amer: 91 mL/min/{1.73_m2} (ref >59)
GFR calc non Af Amer: 79 mL/min/{1.73_m2} (ref >59)
Globulin, Total: 2.4 g/dL (ref 1.5–4.5)
Glucose: 97 mg/dL (ref 65–99)
Potassium: 4.5 mmol/L (ref 3.5–5.2)
Sodium: 137 mmol/L (ref 134–144)
Total Protein: 7 g/dL (ref 6.0–8.5)

## 2019-05-11 LAB — LIPID PANEL
Chol/HDL Ratio: 4.4 ratio (ref 0.0–5.0)
Cholesterol, Total: 145 mg/dL (ref 100–199)
HDL: 33 mg/dL — ABNORMAL LOW (ref >39)
LDL Chol Calc (NIH): 77 mg/dL (ref 0–99)
Triglycerides: 206 mg/dL — ABNORMAL HIGH (ref 0–149)
VLDL Cholesterol Cal: 35 mg/dL (ref 5–40)

## 2019-05-11 LAB — TSH: TSH: 1.64 u[IU]/mL (ref 0.450–4.500)

## 2019-05-11 NOTE — Patient Instructions (Signed)
Medication Instructions:   Your physician recommends that you continue on your current medications as directed. Please refer to the Current Medication list given to you today.  *If you need a refill on your cardiac medications before your next appointment, please call your pharmacy*  Lab Work:  You will have labs drawn today: CMET/TSH/lipid panel   If you have labs (blood work) drawn today and your tests are completely normal, you will receive your results only by: Marland Kitchen MyChart Message (if you have MyChart) OR . A paper copy in the mail If you have any lab test that is abnormal or we need to change your treatment, we will call you to review the results.  Testing/Procedures:  None ordered today  Follow-Up: At [facility], you and your health needs are our priority.  As part of our continuing mission to provide you with exceptional heart care, we have created designated Provider Care Teams.  These Care Teams include your primary Cardiologist (physician) and Advanced Practice Providers (APPs -  Physician Assistants and Nurse Practitioners) who all work together to provide you with the care you need, when you need it.  We recommend signing up for the patient portal called "MyChart".  Sign up information is provided on this After Visit Summary.  MyChart is used to connect with patients for Virtual Visits (Telemedicine).  Patients are able to view lab/test results, encounter notes, upcoming appointments, etc.  Non-urgent messages can be sent to your provider as well.   To learn more about what you can do with MyChart, go to [email or web address].    Your next appointment:   9 month(s)  The format for your next appointment:   In Person  Provider:   Daneen Schick, MD

## 2019-05-18 ENCOUNTER — Encounter: Payer: Medicare Other | Admitting: Family Medicine

## 2019-06-15 ENCOUNTER — Other Ambulatory Visit (HOSPITAL_COMMUNITY): Payer: Self-pay | Admitting: Nurse Practitioner

## 2019-06-16 MED ORDER — AMIODARONE HCL 100 MG PO TABS: 50.0000 mg | ORAL_TABLET | Freq: Every day | ORAL | 3 refills | Status: DC

## 2019-07-25 ENCOUNTER — Ambulatory Visit (INDEPENDENT_AMBULATORY_CARE_PROVIDER_SITE_OTHER): Payer: Medicare Other | Admitting: Family Medicine

## 2019-07-25 ENCOUNTER — Encounter: Payer: Self-pay | Admitting: Family Medicine

## 2019-07-25 ENCOUNTER — Other Ambulatory Visit: Payer: Self-pay

## 2019-07-25 VITALS — BP 142/80 | HR 76 | Temp 97.0°F | Ht 75.0 in | Wt 209.2 lb

## 2019-07-25 DIAGNOSIS — B001 Herpesviral vesicular dermatitis: Secondary | ICD-10-CM | POA: Insufficient documentation

## 2019-07-25 DIAGNOSIS — I25119 Atherosclerotic heart disease of native coronary artery with unspecified angina pectoris: Secondary | ICD-10-CM | POA: Diagnosis not present

## 2019-07-25 DIAGNOSIS — M1990 Unspecified osteoarthritis, unspecified site: Secondary | ICD-10-CM

## 2019-07-25 DIAGNOSIS — E785 Hyperlipidemia, unspecified: Secondary | ICD-10-CM | POA: Diagnosis not present

## 2019-07-25 DIAGNOSIS — I509 Heart failure, unspecified: Secondary | ICD-10-CM | POA: Diagnosis not present

## 2019-07-25 DIAGNOSIS — I10 Essential (primary) hypertension: Secondary | ICD-10-CM

## 2019-07-25 NOTE — Progress Notes (Signed)
Jonathan Holden is a 74 y.o. male who presents today for an office visit. He is transferring care.   Assessment/Plan:  Chronic Problems Addressed Today: HTN (hypertension) At goal per JNC 8.  Continue medications per current cardiology.  Coronary artery disease involving native coronary artery of native heart with angina pectoris ([place]) Continue medications per cardiology.  Hyperlipidemia LDL goal <70, on Praluent Stable.  Continue Praluent per cardiology.   Cold sore Stable.  Continue Valtrex as needed.  CHF (congestive heart failure) (HCC) No signs of volume overload today.  Continue management per cardiology.  Osteoarthritis He will be following up with orthopedics.     Subjective:  HPI:  His stable, chronic medical conditions are outlined below:    # Essential Hypertension / Afib / CHF / CAD - Follows with cardiology - On lisinopril 5mg  daily, metoprolol succinate 25mg  daily, and amiodarone 50mg  daily - On praluent injections  # Cold Sores - Uses valtrex as needed for flare ups  PMH:  The following were reviewed and entered/updated in epic: Past Medical History:  Diagnosis Date  . Acute blood loss anemia   . Acute lower GI bleeding 03/19/2016  . Arthritis   . Atrial fibrillation with RVR ([place]) 02/21/2016  . Coronary artery disease   . Diverticulosis 04/18/2016  . Endocarditis of native valve   . Former smoker   . H/O gastric ulcer   . History of MI (myocardial infarction) 1992  . Hyperlipidemia   . Hypertension   . Infection of intervertebral disc ([place])   . Insomnia 04/18/2016  . Left retinal detachment 03/18/2015  . Loose body of right shoulder   . 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 Pseudophakia of left eye 05/22/2013  . Pseudophakia of right eye 06/05/2013  . S/P coronary angioplasty   . Streptococcal bacteremia    Patient Active Problem List   Diagnosis Date Noted  . CHF (congestive heart failure) ([place]) 07/25/2019  .  Cold sore 07/25/2019  . Diverticulosis 04/18/2016  . Insomnia 04/18/2016  . Osteoarthritis 04/18/2016  . H/O gastric ulcer   . Former smoker   . Gastroesophageal reflux disease   . Paroxysmal atrial fibrillation with RVR ([place]) 02/21/2016  . Coronary artery disease involving native coronary artery of native heart with angina pectoris ([place]) 09/19/2014  . HTN (hypertension) 09/19/2014  . Hyperlipidemia LDL goal <70, on Praluent 10/03/2013   Past Surgical History:  Procedure Laterality Date  . CARDIOVASCULAR STRESS TEST  08/22/2008  . CORONARY ANGIOPLASTY  1992  . ERCP W/ SPHICTEROTOMY  12/15/2002  . LAPAROSCOPIC CHOLECYSTECTOMY  12/16/2002  . LUMBAR LAMINECTOMY     L5  . TEE WITHOUT CARDIOVERSION N/A 02/28/2016   Procedure: TRANSESOPHAGEAL ECHOCARDIOGRAM (TEE);  Surgeon: Fay Records, MD;  [facility] ENDOSCOPY;  Service: Cardiovascular;  Laterality: N/A;    Family History  Problem Relation Age of Onset  . Other Mother        AGE 87 HEALTHY  . Heart attack Father 32       2 MI  . Heart disease Maternal Grandfather   . Heart disease Paternal Grandfather   . Emphysema Other   . Other Brother     Medications- reviewed and updated Current Outpatient Medications  Medication Sig Dispense Refill  . Alirocumab (PRALUENT) 150 MG/ML SOAJ Inject 1 pen into the skin every 14 (fourteen) days. 6 pen 3  . amiodarone (PACERONE) 100 MG tablet Take 0.5 tablets (50 mg total) by mouth daily. 45 tablet 3  .  ketoconazole (NIZORAL) 2 % shampoo APPLY 1 3 TIMES A WEEK TO SCALP. LET SIT 10 MIN THEN RINSE OUT.    Marland Kitchen lisinopril (ZESTRIL) 10 MG tablet Take 0.5 tablets (5 mg total) by mouth daily. 45 tablet 3  . metoprolol succinate (TOPROL-XL) 25 MG 24 hr tablet TAKE 1/2 TABLET BY MOUTH EVERY DAY 45 tablet 2  . Polyvinyl Alcohol (LIQUID TEARS OP) Place 1 drop into both eyes daily as needed (DRY EYE).    . valACYclovir (VALTREX) 1000 MG tablet TAKE 2 TABLETS BY MOUTH TWICE A DAY AS NEEDED FOR FLARES      No current facility-administered medications for this visit.    Allergies-reviewed and updated Allergies  Allergen Reactions  . Eliquis [Apixaban] Other (See Comments)    Recurrent GI bleeding occurred from diverticulosis in the setting of anticoagulation therapy for atrial fibrillation.  . 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   Reaction:  Unknown   . Glutamic Acid Other (See Comments)    Pt is allergic to melons. Reaction:Unknown   . Ciprofloxacin Hives  . Pollen Extract Other (See Comments)    Sneezing     Social History   Socioeconomic History  . Marital status: Divorced    Spouse name: Not on file  . Number of children: Not on file  . Years of education: Not on file  . Highest education level: Not on file  Occupational History  . Not on file  Tobacco Use  . Smoking status: Former Smoker    Packs/day: 2.00    Years: 20.00    Pack years: 40.00    Types: Cigarettes    Quit date: 03/27/1990    Years since quitting: 29.3  . Smokeless tobacco: Never Used  Vaping Use  . Vaping Use: Never used  Substance and Sexual Activity  . Alcohol use: Not Currently    Comment: Quit drinking this year   . Drug use: No  . Sexual activity: Not Currently  Other Topics Concern  . Not on file  Social History Narrative  . Not on file   Social Determinants of Health   Financial Resource Strain:   . Difficulty of Paying Living Expenses:   Food Insecurity:   . Worried About Charity fundraiser in the Last Year:   . Arboriculturist in the Last Year:   Transportation Needs:   . Film/video editor (Medical):   Marland Kitchen Lack of Transportation (Non-Medical):   Physical Activity:   . Days of Exercise per Week:   . Minutes of Exercise per Session:   Stress:   . Feeling of Stress :   Social Connections:   . Frequency of Communication with Friends and Family:   . Frequency of Social Gatherings with Friends and Family:   . Attends Religious Services:   . Active  Member of Clubs or Organizations:   . Attends Archivist Meetings:   Marland Kitchen Marital Status:            Objective:  Physical Exam: BP (!) 142/80   Pulse 76   Temp (!) 97 F (36.1 C)   Ht 6\' 3"  (1.905 m)   Wt 209 lb 3.2 oz (94.9 kg)   SpO2 97%   BMI 26.15 kg/m   Gen: No acute distress, resting comfortably CV: Regular rate and rhythm with no murmurs appreciated Pulm: Normal work of breathing, clear to auscultation bilaterally with no crackles, wheezes, or rhonchi Neuro: Grossly normal,  moves all extremities Psych: Normal affect and thought content  Time Spent: 43 minutes of total time was spent on the date of the encounter performing the following actions: chart review prior to seeing the patient including past visits with previous PCP and specialists, obtaining history, performing a medically necessary exam, counseling on the treatment plan, placing orders, and documenting in our EHR.        Algis Greenhouse. Jerline Pain, MD 07/25/2019 10:49 AM

## 2019-07-25 NOTE — Assessment & Plan Note (Signed)
Stable.  Continue Valtrex as needed.

## 2019-07-25 NOTE — Assessment & Plan Note (Addendum)
At goal per JNC 8.  Continue medications per current cardiology.

## 2019-07-25 NOTE — Patient Instructions (Addendum)
It was very nice to see you today!  [facility]leace check with the [employer] about the personal trainer program.   Keep up the good work!  Come back in a year for your annual check up, or sooner if needed.   Take care, Dr Jerline Pain  Please try these tips to maintain a healthy lifestyle:   Eat at least 3 REAL meals and 1-2 snacks per day.  Aim for no more than 5 hours between eating.  If you eat breakfast, please do so within one hour of getting up.    Each meal should contain half fruits/vegetables, one quarter protein, and one quarter carbs (no bigger than a computer mouse)   Cut down on sweet beverages. This includes juice, soda, and sweet tea.     Drink at least 1 glass of water with each meal and aim for at least 8 glasses per day   Exercise at least 150 minutes every week.

## 2019-07-25 NOTE — Assessment & Plan Note (Signed)
Continue medications per cardiology.

## 2019-07-25 NOTE — Assessment & Plan Note (Signed)
Stable.  Continue Praluent per cardiology.

## 2019-07-25 NOTE — Assessment & Plan Note (Signed)
No signs of volume overload today.  Continue management per cardiology. 

## 2019-07-25 NOTE — Assessment & Plan Note (Signed)
He will be following up with orthopedics.

## 2019-07-26 ENCOUNTER — Other Ambulatory Visit: Payer: Self-pay | Admitting: Interventional Cardiology

## 2019-08-04 ENCOUNTER — Telehealth: Payer: Self-pay

## 2019-08-04 NOTE — Telephone Encounter (Signed)
Intake appt for PREP scheduled for 7/21 at [place] at [facility]

## 2019-08-09 NOTE — Progress Notes (Signed)
[place] Report   Patient Details  Name: NEITHAN DAY MRN: [id number] Date of Birth: 1945/05/13 Age: 74 y.o. PCP: Vivi Barrack, MD  Vitals:   08/09/19 0947  BP: (!) 142/88  Pulse: 78  SpO2: 96%  Weight: 209 lb 12.8 oz (95.2 kg)  Height: 6\' 2"  (1.88 m)      [employer] Eval - 08/09/19 0900      Referral    Referring Provider Jerline Pain    Reason for referral Inactivity    Program Start Date 08/14/19   M/W 1p-215p x 12 wks      Measurement   Neck measurement 16 Inches    Waist Circumference 46 inches    Body fat 30.1 percent      Information for Trainer   Goals Exercise regimen, strength knee, decrease pain    Current Exercise none    Orthopedic Concerns R knee, R Shoulder, hx of back and shoulder surg    Pertinent Medical History HTN, Sepsis, MI    Medications that affect exercise Beta blocker;Medication causing dizziness/drowsiness      Timed Up and Go (TUGS)   Timed Up and Go Low risk <9 seconds      Mobility and Daily Activities   I find it easy to walk up or down two or more flights of stairs. 2    I have no trouble taking out the trash. 4    I do housework such as vacuuming and dusting on my own without difficulty. 4    I can easily lift a gallon of milk (8lbs). 4    I can easily walk a mile. 1    I have no trouble reaching into high cupboards or reaching down to pick up something from the floor. 3    I do not have trouble doing out-door work such as Armed forces logistics/support/administrative officer, raking leaves, or gardening. 4      Mobility and Daily Activities   I feel younger than my age. 1    I feel independent. 2    I feel energetic. 2    I live an active life.  2    I feel strong. 2    I feel healthy. 2    I feel active as other people my age. 4      How fit and strong are you.   Fit and Strong Total Score 37          Past Medical History:  Diagnosis Date  . Acute blood loss anemia   . Acute lower GI bleeding 03/19/2016  . Arthritis   . Atrial  fibrillation with RVR ([place]) 02/21/2016  . Coronary artery disease   . Diverticulosis 04/18/2016  . Endocarditis of native valve   . Former smoker   . H/O gastric ulcer   . History of MI (myocardial infarction) 1992  . Hyperlipidemia   . Hypertension   . Infection of intervertebral disc ([place])   . Insomnia 04/18/2016  . Left retinal detachment 03/18/2015  . Loose body of right shoulder   . 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 Pseudophakia of left eye 05/22/2013  . Pseudophakia of right eye 06/05/2013  . S/P coronary angioplasty   . Streptococcal bacteremia    Past Surgical History:  Procedure Laterality Date  . CARDIOVASCULAR STRESS TEST  08/22/2008  . CORONARY ANGIOPLASTY  1992  . ERCP W/ SPHICTEROTOMY  12/15/2002  . LAPAROSCOPIC CHOLECYSTECTOMY  12/16/2002  . LUMBAR LAMINECTOMY  L5  . TEE WITHOUT CARDIOVERSION N/A 02/28/2016   Procedure: TRANSESOPHAGEAL ECHOCARDIOGRAM (TEE);  Surgeon: Fay Records, MD;  Location: [facility] ENDOSCOPY;  Service: Cardiovascular;  Laterality: N/A;   Social History   Tobacco Use  Smoking Status Former Smoker  . Packs/day: 2.00  . Years: 20.00  . Pack years: 40.00  . Types: Cigarettes  . Quit date: 03/27/1990  . Years since quitting: 29.3  Smokeless Tobacco Never Used        Barnett Hatter 08/09/2019, 9:52 AM

## 2019-08-10 DIAGNOSIS — M25561 Pain in right knee: Secondary | ICD-10-CM | POA: Diagnosis not present

## 2019-08-16 ENCOUNTER — Telehealth: Payer: Self-pay | Admitting: Interventional Cardiology

## 2019-08-16 NOTE — Telephone Encounter (Signed)
Pt called to report that earlier today he was relaxing and took out the garbage... when he got back to his chair he felt very out of breath which is unusual for him.   He says he has been checking his BP and it has been 151-162/ 90-100 and HR 90-100. He denies continued SOB, dizziness, palpitations but feels anxious since this has not happened in several months. He feels he has improved now and will wait a bit before he rechecks his BP and HR. No chest pain.   He has been taking all of his meds daily.   I have advised him to continue to monitor and write down hjis readings but not to check too often. He will try to hydrate since he has not drank too much fluids today. He has no new OTC meds. He will call the MD on call tonight or go to the ED of anything changes or worsens.    Will forward to Dr. Tamala Julian for review. Next OV is not until 01/2020.

## 2019-08-16 NOTE — Telephone Encounter (Signed)
This sounds like recurrent atrial fibrillation

## 2019-08-16 NOTE — Telephone Encounter (Signed)
Pt c/o BP issue: STAT if pt c/o blurred vision, one-sided weakness or slurred speech  1. What are your last 5 BP readings?  162/89 159/92 2. Are you having any other symptoms (ex. Dizziness, headache, blurred vision, passed out)? SOB  3. What is your BP issue? Patient states BP has been elevated.   Pt c/o Shortness Of Breath: STAT if SOB developed within the last 24 hours or pt is noticeably SOB on the phone  1. Are you currently SOB (can you hear that pt is SOB on the phone)? No  2. How long have you been experiencing SOB? Patient states SOB began this morning, however it only lasted about 2 minutes. He states he is feeling better now.  3. Are you SOB when sitting or when up moving around? When up and moving around  4. Are you currently experiencing any other symptoms? No

## 2019-08-17 ENCOUNTER — Other Ambulatory Visit: Payer: Self-pay | Admitting: *Deleted

## 2019-08-17 ENCOUNTER — Other Ambulatory Visit: Payer: Self-pay

## 2019-08-17 MED ORDER — AMIODARONE HCL 200 MG PO TABS: 100.0000 mg | ORAL_TABLET | Freq: Every day | ORAL | Status: DC

## 2019-08-17 NOTE — Progress Notes (Signed)
Cardiology Office Note:    Date:  08/18/2019   ID:  [name], DOB May 15, 1945, MRN [id number]  PCP:  Vivi Barrack, MD  Cardiologist:  Sinclair Grooms, MD   Referring MD: Vivi Barrack, MD   Chief Complaint  Patient presents with  . Atrial Fibrillation  . Coronary Artery Disease    History of Present Illness:    Jonathan Holden is a 74 y.o. male with a hx of coronary artery disease, right coronary angioplasty during acute infarction, history of gastric ulcer, recurrent diverticular colonic bleeding onEliquis,paroxysmalatrial fibrillationwith rhythm control on Amiodarone,lumbar discitis with streptococcal bacteremia, hyperlipidemia, and hypertension.  Recent episode of not feeling well as described in the telephone note: "Pt called to report that earlier today he was relaxing and took out the garbage... when he got back to his chair he felt very out of breath which is unusual for him.   He says he has been checking his BP and it has been 151-162/ 90-100 and HR 90-100. He denies continued SOB, dizziness, palpitations but feels anxious since this has not happened in several months. He feels he has improved now and will wait a bit before he rechecks his BP and HR. No chest pain."  By the next day he felt better.  Palpitations have resolved.Marland Kitchen  He feels back to normal.  The entire episode lasted less than 5 minutes.  He feels great today.  Did have a blood pressure this morning of 150/84 mmHg.  Past Medical History:  Diagnosis Date  . Acute blood loss anemia   . Acute lower GI bleeding 03/19/2016  . Arthritis   . Atrial fibrillation with RVR ([place]) 02/21/2016  . Coronary artery disease   . Diverticulosis 04/18/2016  . Endocarditis of native valve   . Former smoker   . H/O gastric ulcer   . History of MI (myocardial infarction) 1992  . Hyperlipidemia   . Hypertension   . Infection of intervertebral disc ([place])   . Insomnia 04/18/2016  . Left retinal detachment  03/18/2015  . Loose body of right shoulder   . 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 Pseudophakia of left eye 05/22/2013  . Pseudophakia of right eye 06/05/2013  . S/P coronary angioplasty   . Streptococcal bacteremia     Past Surgical History:  Procedure Laterality Date  . CARDIOVASCULAR STRESS TEST  08/22/2008  . CORONARY ANGIOPLASTY  1992  . ERCP W/ SPHICTEROTOMY  12/15/2002  . LAPAROSCOPIC CHOLECYSTECTOMY  12/16/2002  . LUMBAR LAMINECTOMY     L5  . TEE WITHOUT CARDIOVERSION N/A 02/28/2016   Procedure: TRANSESOPHAGEAL ECHOCARDIOGRAM (TEE);  Surgeon: Fay Records, MD;  Location: [facility] ENDOSCOPY;  Service: Cardiovascular;  Laterality: N/A;    Current Medications: No outpatient medications have been marked as taking for the 08/18/19 encounter (Office Visit) with Belva Crome, MD.     Allergies:   Eliquis [apixaban], Food, Glutamic acid, Ciprofloxacin, and Pollen extract   Social History   Socioeconomic History  . Marital status: Divorced    Spouse name: Not on file  . Number of children: Not on file  . Years of education: Not on file  . Highest education level: Not on file  Occupational History  . Not on file  Tobacco Use  . Smoking status: Former Smoker    Packs/day: 2.00    Years: 20.00    Pack years: 40.00    Types: Cigarettes    Quit date: 03/27/1990  Years since quitting: 29.4  . Smokeless tobacco: Never Used  Vaping Use  . Vaping Use: Never used  Substance and Sexual Activity  . Alcohol use: Not Currently    Comment: Quit drinking this year   . Drug use: No  . Sexual activity: Not Currently  Other Topics Concern  . Not on file  Social History Narrative  . Not on file   Social Determinants of Health   Financial Resource Strain:   . Difficulty of Paying Living Expenses:   Food Insecurity:   . Worried About Charity fundraiser in the Last Year:   . Arboriculturist in the Last Year:   Transportation Needs:   . Film/video editor  (Medical):   Marland Kitchen Lack of Transportation (Non-Medical):   Physical Activity:   . Days of Exercise per Week:   . Minutes of Exercise per Session:   Stress:   . Feeling of Stress :   Social Connections:   . Frequency of Communication with Friends and Family:   . Frequency of Social Gatherings with Friends and Family:   . Attends Religious Services:   . Active Member of Clubs or Organizations:   . Attends Archivist Meetings:   Marland Kitchen Marital Status:      Family History: The patient's family history includes Emphysema in an other family member; Heart attack (age of onset: 96) in his father; Heart disease in his maternal grandfather and paternal grandfather; Other in his brother and mother.  ROS:   Please see the history of present illness.    No dizziness or other complaints.  All other systems reviewed and are negative.  EKGs/Labs/Other Studies Reviewed:    The following studies were reviewed today: No new functional data  EKG:  EKG today reveals normal sinus rhythm in October 2020.  Normal sinus rhythm with normal appearance.  No change when compared to prior.  Recent Labs: 05/11/2019: ALT 13; BUN 15; Creatinine, Ser 0.95; Potassium 4.5; Sodium 137; TSH 1.640  Recent Lipid Panel    Component Value Date/Time   CHOL 145 05/11/2019 0913   TRIG 206 (H) 05/11/2019 0913   HDL 33 (L) 05/11/2019 0913   CHOLHDL 4.4 05/11/2019 0913   CHOLHDL 2 10/22/2014 1631   VLDL 22.2 10/22/2014 1631   LDLCALC 77 05/11/2019 0913   LDLDIRECT 33 05/08/2016 1128   LDLDIRECT 143.5 12/06/2012 0909    Physical Exam:    VS:  Ht 6\' 2"  (1.88 m)   BMI 26.94 kg/m     Wt Readings from Last 3 Encounters:  08/09/19 209 lb 12.8 oz (95.2 kg)  07/25/19 209 lb 3.2 oz (94.9 kg)  05/11/19 209 lb 12.8 oz (95.2 kg)     GEN: Compatible with age. No acute distress HEENT: Normal NECK: No JVD. LYMPHATICS: No lymphadenopathy CARDIAC:  RRR without murmur, gallop, or edema. VASCULAR:  Normal Pulses. No  bruits. RESPIRATORY:  Clear to auscultation without rales, wheezing or rhonchi  ABDOMEN: Soft, non-tender, non-distended, No pulsatile mass, MUSCULOSKELETAL: No deformity  SKIN: Warm and dry NEUROLOGIC:  Alert and oriented x 3 PSYCHIATRIC:  Normal affect   ASSESSMENT:    1. Paroxysmal atrial fibrillation with RVR ([place])   2. Coronary artery disease of native artery of native heart with stable angina pectoris ([place])   3. On amiodarone therapy   4. Hyperlipidemia LDL goal <70   5. Essential hypertension   6. Educated about COVID-19 virus infection    PLAN:  In order of problems listed above:  1. No evidence of atrial fibrillation.  On amiodarone to control rhythm.  Continue 200 mg amiodarone, 1/2 tablet daily. 2. Secondary prevention discussed  Significant conversation concerning physical activity and attempting to achieve greater than 150 minutes of moderate activity per week. 3. Follow-up in January for amnio screen for toxicity with liver and lipid panel. 4. Continue sufficient intensity of Praluent to keep LDL less than 70. 5. Blood pressure will be monitored at home.  Target is 130/80 mmHg.  He will call back results in a couple weeks and if the majority of those are not in acceptable range, will need to increase Zestril, Toprol, or both.  Overall education and awareness concerning secondary risk prevention was discussed in detail: LDL less than 70, hemoglobin A1c less than 7, blood pressure target less than 130/80 mmHg, >150 minutes of moderate aerobic activity per week, avoidance of smoking, weight control (via diet and exercise), and continued surveillance/management of/for obstructive sleep apnea.    Medication Adjustments/Labs and Tests Ordered: Current medicines are reviewed at length with the patient today.  Concerns regarding medicines are outlined above.  No orders of the defined types were placed in this encounter.  No orders of the defined types were placed in this  encounter.   There are no Patient Instructions on file for this visit.   Signed, Sinclair Grooms, MD  08/18/2019 1:44 PM    [place]

## 2019-08-17 NOTE — Telephone Encounter (Signed)
Pt states he feels almost back to normal today.  HR lying down this morning on his smart watch was 92.  Got up and checked vitals and they were 130/71, 96.  Yesterday when the episode occurred HR on watch fluctuated between 90s-117.  Pt states HR normally in the 70s-80s range.  Scheduled pt to come in and see Dr. Tamala Julian tomorrow so we can do an EKG to make sure he is not in AF, especially since he is not anticoagulated.  Pt verbalized understanding and was in agreement with this plan.

## 2019-08-18 ENCOUNTER — Encounter: Payer: Self-pay | Admitting: Interventional Cardiology

## 2019-08-18 ENCOUNTER — Ambulatory Visit (INDEPENDENT_AMBULATORY_CARE_PROVIDER_SITE_OTHER): Payer: Medicare Other | Admitting: Interventional Cardiology

## 2019-08-18 ENCOUNTER — Other Ambulatory Visit: Payer: Self-pay

## 2019-08-18 VITALS — BP 142/84 | HR 75 | Ht 74.0 in | Wt 209.0 lb

## 2019-08-18 DIAGNOSIS — I480 Paroxysmal atrial fibrillation: Secondary | ICD-10-CM | POA: Diagnosis not present

## 2019-08-18 DIAGNOSIS — I10 Essential (primary) hypertension: Secondary | ICD-10-CM

## 2019-08-18 DIAGNOSIS — Z79899 Other long term (current) drug therapy: Secondary | ICD-10-CM

## 2019-08-18 DIAGNOSIS — E785 Hyperlipidemia, unspecified: Secondary | ICD-10-CM | POA: Diagnosis not present

## 2019-08-18 DIAGNOSIS — Z7189 Other specified counseling: Secondary | ICD-10-CM

## 2019-08-18 DIAGNOSIS — I25119 Atherosclerotic heart disease of native coronary artery with unspecified angina pectoris: Secondary | ICD-10-CM

## 2019-08-18 DIAGNOSIS — I25118 Atherosclerotic heart disease of native coronary artery with other forms of angina pectoris: Secondary | ICD-10-CM

## 2019-08-18 NOTE — Patient Instructions (Signed)
Medication Instructions:  Your physician recommends that you continue on your current medications as directed. Please refer to the Current Medication list given to you today.  *If you need a refill on your cardiac medications before your next appointment, please call your pharmacy*   Lab Work: None If you have labs (blood work) drawn today and your tests are completely normal, you will receive your results only by: Marland Kitchen MyChart Message (if you have MyChart) OR . A paper copy in the mail If you have any lab test that is abnormal or we need to change your treatment, we will call you to review the results.   Testing/Procedures: None   Follow-Up: At [facility], you and your health needs are our priority.  As part of our continuing mission to provide you with exceptional heart care, we have created designated Provider Care Teams.  These Care Teams include your primary Cardiologist (physician) and Advanced Practice Providers (APPs -  Physician Assistants and Nurse Practitioners) who all work together to provide you with the care you need, when you need it.  We recommend signing up for the patient portal called "MyChart".  Sign up information is provided on this After Visit Summary.  MyChart is used to connect with patients for Virtual Visits (Telemedicine).  Patients are able to view lab/test results, encounter notes, upcoming appointments, etc.  Non-urgent messages can be sent to your provider as well.   To learn more about what you can do with MyChart, go to [email or web address].    Follow up in January as previously planned  Other Instructions

## 2019-09-21 ENCOUNTER — Ambulatory Visit (INDEPENDENT_AMBULATORY_CARE_PROVIDER_SITE_OTHER): Payer: Medicare Other

## 2019-09-21 DIAGNOSIS — Z0000 Encounter for general adult medical examination without abnormal findings: Secondary | ICD-10-CM | POA: Diagnosis not present

## 2019-09-21 NOTE — Patient Instructions (Addendum)
[name] , Thank you for taking time to come for your Medicare Wellness Visit. I appreciate your ongoing commitment to your health goals. Please review the following plan we discussed and let me know if I can assist you in the future.   Screening recommendations/referrals: Colonoscopy: Done 04/10/14 Recommended yearly ophthalmology/optometry visit for glaucoma screening and checkup Recommended yearly dental visit for hygiene and checkup  Vaccinations: Influenza vaccine: Up to date Pneumococcal vaccine: Due and Discussed Tdap vaccine: Due and Discussed Shingles vaccine: Shingrix discussed. Please contact your pharmacy for coverage information.    Covid-19: Completed 02/27/19 & 03/24/19  Advanced directives: Please bring a copy of your health care power of attorney and living will to the office at your convenience.  Conditions/risks identified: Lose weight  Next appointment: Follow up in one year for your annual wellness visit.   Preventive Care 75 Years and Older, Male Preventive care refers to lifestyle choices and visits with your health care provider that can promote health and wellness. What does preventive care include?  A yearly physical exam. This is also called an annual well check.  Dental exams once or twice a year.  Routine eye exams. Ask your health care provider how often you should have your eyes checked.  Personal lifestyle choices, including:  Daily care of your teeth and gums.  Regular physical activity.  Eating a healthy diet.  Avoiding tobacco and drug use.  Limiting alcohol use.  Practicing safe sex.  Taking low doses of aspirin every day.  Taking vitamin and mineral supplements as recommended by your health care provider. What happens during an annual well check? The services and screenings done by your health care provider during your annual well check will depend on your age, overall health, lifestyle risk factors, and family history of  disease. Counseling  Your health care provider may ask you questions about your:  Alcohol use.  Tobacco use.  Drug use.  Emotional well-being.  Home and relationship well-being.  Sexual activity.  Eating habits.  History of falls.  Memory and ability to understand (cognition).  Work and work Statistician. Screening  You may have the following tests or measurements:  Height, weight, and BMI.  Blood pressure.  Lipid and cholesterol levels. These may be checked every 5 years, or more frequently if you are over 22 years old.  Skin check.  Lung cancer screening. You may have this screening every year starting at age 86 if you have a 30-pack-year history of smoking and currently smoke or have quit within the past 15 years.  Fecal occult blood test (FOBT) of the stool. You may have this test every year starting at age 14.  Flexible sigmoidoscopy or colonoscopy. You may have a sigmoidoscopy every 5 years or a colonoscopy every 10 years starting at age 19.  Prostate cancer screening. Recommendations will vary depending on your family history and other risks.  Hepatitis C blood test.  Hepatitis B blood test.  Sexually transmitted disease (STD) testing.  Diabetes screening. This is done by checking your blood sugar (glucose) after you have not eaten for a while (fasting). You may have this done every 1-3 years.  Abdominal aortic aneurysm (AAA) screening. You may need this if you are a current or former smoker.  Osteoporosis. You may be screened starting at age 33 if you are at high risk. Talk with your health care provider about your test results, treatment options, and if necessary, the need for more tests. Vaccines  Your health care  provider may recommend certain vaccines, such as:  Influenza vaccine. This is recommended every year.  Tetanus, diphtheria, and acellular pertussis (Tdap, Td) vaccine. You may need a Td booster every 10 years.  Zoster vaccine. You may  need this after age 60.  Pneumococcal 13-valent conjugate (PCV13) vaccine. One dose is recommended after age 1.  Pneumococcal polysaccharide (PPSV23) vaccine. One dose is recommended after age 37. Talk to your health care provider about which screenings and vaccines you need and how often you need them. This information is not intended to replace advice given to you by your health care provider. Make sure you discuss any questions you have with your health care provider. Document Released: 02/01/2015 Document Revised: 09/25/2015 Document Reviewed: 11/06/2014 Elsevier Interactive Patient Education  2017 [place] Prevention in the Home Falls can cause injuries. They can happen to people of all ages. There are many things you can do to make your home safe and to help prevent falls. What can I do on the outside of my home?  Regularly fix the edges of walkways and driveways and fix any cracks.  Remove anything that might make you trip as you walk through a door, such as a raised step or threshold.  Trim any bushes or trees on the path to your home.  Use bright outdoor lighting.  Clear any walking paths of anything that might make someone trip, such as rocks or tools.  Regularly check to see if handrails are loose or broken. Make sure that both sides of any steps have handrails.  Any raised decks and porches should have guardrails on the edges.  Have any leaves, snow, or ice cleared regularly.  Use sand or salt on walking paths during winter.  Clean up any spills in your garage right away. This includes oil or grease spills. What can I do in the bathroom?  Use night lights.  Install grab bars by the toilet and in the tub and shower. Do not use towel bars as grab bars.  Use non-skid mats or decals in the tub or shower.  If you need to sit down in the shower, use a plastic, non-slip stool.  Keep the floor dry. Clean up any water that spills on the floor as soon as it  happens.  Remove soap buildup in the tub or shower regularly.  Attach bath mats securely with double-sided non-slip rug tape.  Do not have throw rugs and other things on the floor that can make you trip. What can I do in the bedroom?  Use night lights.  Make sure that you have a light by your bed that is easy to reach.  Do not use any sheets or blankets that are too big for your bed. They should not hang down onto the floor.  Have a firm chair that has side arms. You can use this for support while you get dressed.  Do not have throw rugs and other things on the floor that can make you trip. What can I do in the kitchen?  Clean up any spills right away.  Avoid walking on wet floors.  Keep items that you use a lot in easy-to-reach places.  If you need to reach something above you, use a strong step stool that has a grab bar.  Keep electrical cords out of the way.  Do not use floor polish or wax that makes floors slippery. If you must use wax, use non-skid floor wax.  Do not have throw  rugs and other things on the floor that can make you trip. What can I do with my stairs?  Do not leave any items on the stairs.  Make sure that there are handrails on both sides of the stairs and use them. Fix handrails that are broken or loose. Make sure that handrails are as long as the stairways.  Check any carpeting to make sure that it is firmly attached to the stairs. Fix any carpet that is loose or worn.  Avoid having throw rugs at the top or bottom of the stairs. If you do have throw rugs, attach them to the floor with carpet tape.  Make sure that you have a light switch at the top of the stairs and the bottom of the stairs. If you do not have them, ask someone to add them for you. What else can I do to help prevent falls?  Wear shoes that:  Do not have high heels.  Have rubber bottoms.  Are comfortable and fit you well.  Are closed at the toe. Do not wear sandals.  If you  use a stepladder:  Make sure that it is fully opened. Do not climb a closed stepladder.  Make sure that both sides of the stepladder are locked into place.  Ask someone to hold it for you, if possible.  Clearly mark and make sure that you can see:  Any grab bars or handrails.  First and last steps.  Where the edge of each step is.  Use tools that help you move around (mobility aids) if they are needed. These include:  Canes.  Walkers.  Scooters.  Crutches.  Turn on the lights when you go into a dark area. Replace any light bulbs as soon as they burn out.  Set up your furniture so you have a clear path. Avoid moving your furniture around.  If any of your floors are uneven, fix them.  If there are any pets around you, be aware of where they are.  Review your medicines with your doctor. Some medicines can make you feel dizzy. This can increase your chance of falling. Ask your doctor what other things that you can do to help prevent falls. This information is not intended to replace advice given to you by your health care provider. Make sure you discuss any questions you have with your health care provider. Document Released: 11/01/2008 Document Revised: 06/13/2015 Document Reviewed: 02/09/2014 Elsevier Interactive Patient Education  2017 [employer].

## 2019-09-21 NOTE — Progress Notes (Addendum)
Virtual Visit via Telephone Note  I connected with  Jonathan Holden on 09/21/19 at  8:00 AM EDT by telephone and verified that I am speaking with the correct person using two identifiers.  Medicare Annual Wellness visit completed telephonically due to Covid-19 pandemic.   Persons participating in this call: This Health Coach and this patient.   Location: Patient: Home Provider: Office   I discussed the limitations, risks, security and privacy concerns of performing an evaluation and management service by telephone and the availability of in person appointments. The patient expressed understanding and agreed to proceed.  Unable to perform video visit due to video visit attempted and failed and/or patient does not have video capability.   Some vital signs may be absent or patient reported.   Willette Brace, LPN    Subjective:   Jonathan Holden is a 74 y.o. male who presents for Medicare Annual/Subsequent preventive examination.  Review of Systems     Cardiac Risk Factors include: male gender;dyslipidemia;hypertension     Objective:    There were no vitals filed for this visit. There is no height or weight on file to calculate BMI.  Advanced Directives 09/21/2019 06/08/2017 12/16/2016 04/20/2016 03/17/2016 03/14/2016 03/11/2016  Does Patient Have a Medical Advance Directive? Yes Yes No Yes Yes Yes Yes  Type of Paramedic of [place];Living will [place];Living will - [place];Living will Living will;Healthcare Power of [place];Living will  Does patient want to make changes to medical advance directive? - No - Patient declined - - Yes (ED - Information included in AVS) - No - Patient declined  Copy of [place] in Chart? No - copy requested No - copy requested - No - copy requested Yes - Yes  Would patient like information on creating a medical  advance directive? - - - - No - Patient declined - No - Patient declined  Pre-existing out of facility DNR order (yellow form or pink MOST form) - - - - - - -    Current Medications (verified) Outpatient Encounter Medications as of 09/21/2019  Medication Sig  . Alirocumab (PRALUENT) 150 MG/ML SOAJ Inject 1 pen into the skin every 14 (fourteen) days.  Marland Kitchen amiodarone (PACERONE) 200 MG tablet Take 0.5 tablets (100 mg total) by mouth daily.  . clobetasol ointment (TEMOVATE) 0.01 % Apply 1 application topically 2 (two) times daily.  Marland Kitchen ketoconazole (NIZORAL) 2 % shampoo APPLY 1 3 TIMES A WEEK TO SCALP. LET SIT 10 MIN THEN RINSE OUT.  Marland Kitchen lisinopril (ZESTRIL) 10 MG tablet Take 0.5 tablets (5 mg total) by mouth daily.  . metoprolol succinate (TOPROL-XL) 25 MG 24 hr tablet TAKE 1/2 TABLET BY MOUTH EVERY DAY  . Polyvinyl Alcohol (LIQUID TEARS OP) Place 1 drop into both eyes daily as needed (DRY EYE).  . valACYclovir (VALTREX) 1000 MG tablet TAKE 2 TABLETS BY MOUTH TWICE A DAY AS NEEDED FOR FLARES   No facility-administered encounter medications on file as of 09/21/2019.    Allergies (verified) Eliquis [apixaban], Food, Glutamic acid, Ciprofloxacin, and Pollen extract   History: Past Medical History:  Diagnosis Date  . Acute blood loss anemia   . Acute lower GI bleeding 03/19/2016  . Arthritis   . Atrial fibrillation with RVR ([place]) 02/21/2016  . Coronary artery disease   . Diverticulosis 04/18/2016  . Endocarditis of native valve   . Former smoker   . H/O  gastric ulcer   . History of MI (myocardial infarction) 1992  . Hyperlipidemia   . Hypertension   . Infection of intervertebral disc ([place])   . Insomnia 04/18/2016  . Left retinal detachment 03/18/2015  . Loose body of right shoulder   . 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 Pseudophakia of left eye 05/22/2013  . Pseudophakia of right eye 06/05/2013  . S/P coronary angioplasty   . Streptococcal bacteremia    Past Surgical  History:  Procedure Laterality Date  . CARDIOVASCULAR STRESS TEST  08/22/2008  . CORONARY ANGIOPLASTY  1992  . ERCP W/ SPHICTEROTOMY  12/15/2002  . LAPAROSCOPIC CHOLECYSTECTOMY  12/16/2002  . LUMBAR LAMINECTOMY     L5  . TEE WITHOUT CARDIOVERSION N/A 02/28/2016   Procedure: TRANSESOPHAGEAL ECHOCARDIOGRAM (TEE);  Surgeon: Fay Records, MD;  [facility];  Service: Cardiovascular;  Laterality: N/A;   Family History  Problem Relation Age of Onset  . Other Mother        AGE 84 HEALTHY  . Heart attack Father 1       2 MI  . Heart disease Maternal Grandfather   . Heart disease Paternal Grandfather   . Emphysema Other   . Other Brother    Social History   Socioeconomic History  . Marital status: Divorced    Spouse name: Not on file  . Number of children: Not on file  . Years of education: Not on file  . Highest education level: Not on file  Occupational History  . Occupation: Press photographer  Tobacco Use  . Smoking status: Former Smoker    Packs/day: 2.00    Years: 20.00    Pack years: 40.00    Types: Cigarettes    Quit date: 03/27/1990    Years since quitting: 29.5  . Smokeless tobacco: Never Used  Vaping Use  . Vaping Use: Never used  Substance and Sexual Activity  . Alcohol use: Not Currently    Comment: Quit drinking this year   . Drug use: No  . Sexual activity: Not Currently  Other Topics Concern  . Not on file  Social History Narrative  . Not on file   Social Determinants of Health   Financial Resource Strain: Low Risk   . Difficulty of Paying Living Expenses: Not hard at all  Food Insecurity: No Food Insecurity  . Worried About Charity fundraiser in the Last Year: Never true  . Ran Out of Food in the Last Year: Never true  Transportation Needs: No Transportation Needs  . Lack of Transportation (Medical): No  . Lack of Transportation (Non-Medical): No  Physical Activity: Sufficiently Active  . Days of Exercise per Week: 7 days  . Minutes of Exercise per  Session: 60 min  Stress: No Stress Concern Present  . Feeling of Stress : Not at all  Social Connections: Socially Isolated  . Frequency of Communication with Friends and Family: More than three times a week  . Frequency of Social Gatherings with Friends and Family: More than three times a week  . Attends Religious Services: Never  . Active Member of Clubs or Organizations: No  . Attends Archivist Meetings: Never  . Marital Status: Divorced    Tobacco Counseling Counseling given: Not Answered   Clinical Intake:  Pre-visit preparation completed: Yes  Pain : No/denies pain     BMI - recorded: 26.83 Nutritional Status: BMI 25 -29 Overweight Nutritional Risks: None Diabetes: No  How often do you  need to have someone help you when you read instructions, pamphlets, or other written materials from your doctor or pharmacy?: 1 - Never  Diabetic?No  Interpreter Needed?: No  Information entered by :: Charlott Rakes, LPN   Activities of Daily Living In your present state of health, do you have any difficulty performing the following activities: 09/21/2019  Hearing? N  Vision? N  Difficulty concentrating or making decisions? N  Walking or climbing stairs? N  Dressing or bathing? N  Doing errands, shopping? N  Preparing Food and eating ? N  Using the Toilet? N  In the past six months, have you accidently leaked urine? N  Do you have problems with loss of bowel control? N  Managing your Medications? N  Managing your Finances? N  Housekeeping or managing your Housekeeping? N  Some recent data might be hidden    Patient Care Team: Vivi Barrack, MD as PCP - General (Family Medicine) Belva Crome, MD as PCP - Cardiology (Cardiology) System, Provider Not In (Oral Surgery) Ashok Pall, MD as Consulting Physician (Neurosurgery) System, Provider Not In (Dentistry) Carlyle Basques, MD as Consulting Physician (Infectious Diseases) Latanya Maudlin, MD as  Consulting Physician (Orthopedic Surgery) Merleen Nicely, MD as Referring Physician (Dermatology)  Indicate any recent Medical Services you may have received from other than [facility] providers in the past year (date may be approximate).     Assessment:   This is a routine wellness examination for [place].  Hearing/Vision screen  Hearing Screening   125Hz  250Hz  500Hz  1000Hz  2000Hz  3000Hz  4000Hz  6000Hz  8000Hz   Right ear:           Left ear:           Comments: Pt denies any difficulty hearing  Vision Screening Comments: Pt states he has lens replacement [facility]  Dietary issues and exercise activities discussed: Current Exercise Habits: Home exercise routine, Time (Minutes): 60, Frequency (Times/Week): 7, Weekly Exercise (Minutes/Week): 420  Goals    . LDL CALC < 70    . Patient Stated     Lose weight     . Walk independently in the next 30-60 days      Depression Screen PHQ 2/9 Scores 09/21/2019 07/25/2019 12/10/2017 06/08/2017 06/23/2016 04/23/2016 04/20/2016  PHQ - 2 Score 0 0 0 0 0 0 0  PHQ- 9 Score - - 0 0 - - -    Fall Risk Fall Risk  09/21/2019 07/25/2019 06/08/2017 06/23/2016 04/23/2016  Falls in the past year? 0 0 Yes No Yes  Number falls in past yr: 0 - 1 - 1  Injury with Fall? 0 - No - Yes  Risk Factor Category  - - - - High Fall Risk  Risk for fall due to : Impaired balance/gait - History of fall(s);Impaired balance/gait - -  Follow up Falls prevention discussed - Education provided - Falls evaluation completed;Education provided;Falls prevention discussed    Any stairs in or around the home? Yes  If so, are there any without handrails? Yes  Home free of loose throw rugs in walkways, pet beds, electrical cords, etc? Yes  Adequate lighting in your home to reduce risk of falls? Yes   ASSISTIVE DEVICES UTILIZED TO PREVENT FALLS:  Life alert? No  Use of a cane, walker or w/c? No  Grab bars in the bathroom? No  Shower chair or bench in shower? No  Elevated  toilet seat or a handicapped toilet? No   TIMED UP AND GO:  Was the test performed? No .   Cognitive Function:     6CIT Screen 09/21/2019  What Year? 0 points  What month? 0 points  Count back from 20 0 points  Months in reverse 0 points  Repeat phrase 0 points    Immunizations Immunization History  Administered Date(s) Administered  . Fluad Quad(high Dose 65+) 10/26/2018  . Influenza, High Dose Seasonal PF 12/31/2016, 12/10/2017  . Influenza-Unspecified 11/20/2015  . PFIZER SARS-COV-2 Vaccination 02/27/2019, 03/24/2019    TDAP status: Due, Education has been provided regarding the importance of this vaccine. Advised may receive this vaccine at local pharmacy or Health Dept. Aware to provide a copy of the vaccination record if obtained from local pharmacy or Health Dept. Verbalized acceptance and understanding. Flu Vaccine status: Up to date Pneumococcal vaccine status: Declined,  Education has been provided regarding the importance of this vaccine but patient still declined. Advised may receive this vaccine at local pharmacy or Health Dept. Aware to provide a copy of the vaccination record if obtained from local pharmacy or Health Dept. Verbalized acceptance and understanding.  Covid-19 vaccine status: Completed vaccines  Qualifies for Shingles Vaccine? Yes   Zostavax completed No   Shingrix Completed?: No.    Education has been provided regarding the importance of this vaccine. Patient has been advised to call insurance company to determine out of pocket expense if they have not yet received this vaccine. Advised may also receive vaccine at local pharmacy or Health Dept. Verbalized acceptance and understanding.  Screening Tests Health Maintenance  Topic Date Due  . PNA vac Low Risk Adult (1 of 2 - PCV13) Never done  . INFLUENZA VACCINE  08/20/2019  . TETANUS/TDAP  07/24/2020 (Originally 07/31/1964)  . COLONOSCOPY  04/09/2024  . COVID-19 Vaccine  Completed  . Hepatitis C  Screening  Completed    Health Maintenance  Health Maintenance Due  Topic Date Due  . PNA vac Low Risk Adult (1 of 2 - PCV13) Never done  . INFLUENZA VACCINE  08/20/2019    Colorectal cancer screening: Completed 04/10/14. Repeat every 10 years   Additional Screening:  Hepatitis C Screening: qualify; Completed 02/26/16  Vision Screening: Recommended annual ophthalmology exams for early detection of glaucoma and other disorders of the eye. Is the patient up to date with their annual eye exam?  Yes  Who is the provider or what is the name of the office in which the patient attends annual eye exams? [facility]   Dental Screening: Recommended annual dental exams for proper oral hygiene  Community Resource Referral / Chronic Care Management: CRR required this visit?  No   CCM required this visit?  No      Plan:     I have personally reviewed and noted the following in the patient's chart:   . Medical and social history . Use of alcohol, tobacco or illicit drugs  . Current medications and supplements . Functional ability and status . Nutritional status . Physical activity . Advanced directives . List of other physicians . Hospitalizations, surgeries, and ER visits in previous 12 months . Vitals . Screenings to include cognitive, depression, and falls . Referrals and appointments  In addition, I have reviewed and discussed with patient certain preventive protocols, quality metrics, and best practice recommendations. A written personalized care plan for preventive services as well as general preventive health recommendations were provided to patient.     Willette Brace, LPN   04/27/1854   Nurse Notes: None

## 2019-10-20 ENCOUNTER — Other Ambulatory Visit: Payer: Self-pay

## 2019-10-20 MED ORDER — VALACYCLOVIR HCL 1 G PO TABS: ORAL_TABLET | ORAL | 0 refills | Status: DC

## 2019-10-20 NOTE — Telephone Encounter (Signed)
Requesting Rx Valtrex refill Historical provider

## 2019-10-20 NOTE — Telephone Encounter (Signed)
Rx sent in

## 2019-10-20 NOTE — Telephone Encounter (Signed)
  LAST APPOINTMENT DATE: 09/21/2019   NEXT APPOINTMENT DATE:07/25/20  MEDICATION: valACYclovir (VALTREX) 1000 MG tablet  PHARMACY: [id number] - [place], [place] - [address]  COMMENTS: Patient states he only has 2 pills left

## 2019-10-24 DIAGNOSIS — M25561 Pain in right knee: Secondary | ICD-10-CM | POA: Diagnosis not present

## 2019-11-06 ENCOUNTER — Other Ambulatory Visit (HOSPITAL_COMMUNITY): Payer: Self-pay | Admitting: Nurse Practitioner

## 2019-11-09 DIAGNOSIS — M170 Bilateral primary osteoarthritis of knee: Secondary | ICD-10-CM | POA: Diagnosis not present

## 2019-11-11 DIAGNOSIS — Z23 Encounter for immunization: Secondary | ICD-10-CM | POA: Diagnosis not present

## 2019-11-13 DIAGNOSIS — Z85828 Personal history of other malignant neoplasm of skin: Secondary | ICD-10-CM | POA: Diagnosis not present

## 2019-11-13 DIAGNOSIS — L570 Actinic keratosis: Secondary | ICD-10-CM | POA: Diagnosis not present

## 2019-11-13 DIAGNOSIS — L821 Other seborrheic keratosis: Secondary | ICD-10-CM | POA: Diagnosis not present

## 2019-11-13 DIAGNOSIS — D485 Neoplasm of uncertain behavior of skin: Secondary | ICD-10-CM | POA: Diagnosis not present

## 2019-12-03 ENCOUNTER — Other Ambulatory Visit: Payer: Self-pay | Admitting: Interventional Cardiology

## 2019-12-06 DIAGNOSIS — M1711 Unilateral primary osteoarthritis, right knee: Secondary | ICD-10-CM | POA: Diagnosis not present

## 2019-12-13 DIAGNOSIS — M1711 Unilateral primary osteoarthritis, right knee: Secondary | ICD-10-CM | POA: Diagnosis not present

## 2019-12-19 DIAGNOSIS — M1711 Unilateral primary osteoarthritis, right knee: Secondary | ICD-10-CM | POA: Diagnosis not present

## 2019-12-29 ENCOUNTER — Other Ambulatory Visit: Payer: Self-pay | Admitting: *Deleted

## 2019-12-29 MED ORDER — VALACYCLOVIR HCL 1 G PO TABS: ORAL_TABLET | ORAL | 0 refills | Status: DC

## 2020-01-20 ENCOUNTER — Other Ambulatory Visit: Payer: Self-pay | Admitting: Family Medicine

## 2020-01-26 DIAGNOSIS — M1711 Unilateral primary osteoarthritis, right knee: Secondary | ICD-10-CM | POA: Diagnosis not present

## 2020-02-03 DIAGNOSIS — M75101 Unspecified rotator cuff tear or rupture of right shoulder, not specified as traumatic: Secondary | ICD-10-CM | POA: Diagnosis not present

## 2020-02-03 DIAGNOSIS — M25511 Pain in right shoulder: Secondary | ICD-10-CM | POA: Diagnosis not present

## 2020-02-08 ENCOUNTER — Other Ambulatory Visit: Payer: Self-pay | Admitting: Family Medicine

## 2020-02-09 DIAGNOSIS — M19011 Primary osteoarthritis, right shoulder: Secondary | ICD-10-CM | POA: Diagnosis not present

## 2020-02-22 ENCOUNTER — Other Ambulatory Visit: Payer: Self-pay | Admitting: *Deleted

## 2020-02-22 DIAGNOSIS — I10 Essential (primary) hypertension: Secondary | ICD-10-CM

## 2020-02-22 DIAGNOSIS — I25118 Atherosclerotic heart disease of native coronary artery with other forms of angina pectoris: Secondary | ICD-10-CM

## 2020-02-22 DIAGNOSIS — I5032 Chronic diastolic (congestive) heart failure: Secondary | ICD-10-CM

## 2020-02-22 NOTE — Progress Notes (Signed)
[place] Report   Patient Details  Name: Jonathan Holden MRN: [id number] Date of Birth: [birth year] Age: 75 y.o. PCP: Vivi Barrack, MD  Vitals:   02/22/20 1221  BP: 128/70  Pulse: 81  SpO2: 98%  Weight: 218 lb (98.9 kg)  Height: 6\' 2"  (1.88 m)      [facility] Eval - 02/22/20 1200      Referral    Referring Harue Pribble self   Previously referred by Juleen China, current Card Tamala Julian   Reason for referral Inactivity;Hypertension    Program Start Date 02/26/20   MW 10am-1115am     Measurement   Neck measurement 16.5 Inches    Waist Circumference 46.5 inches    Body fat 31.2 percent      Information for Trainer   Goals Gain core strength, balance, lose weight    Current Exercise walking    Orthopedic Concerns r shoulder, r knee, back    Pertinent Medical History [place], previous shoulder sx, HTN, MI in '92, hx of afiib    Restrictions/Precautions Fall risk   Fell on ice recently   Medications that affect exercise Medication causing dizziness/drowsiness;Beta blocker      Timed Up and Go (TUGS)   Timed Up and Go Low risk <9 seconds      Mobility and Daily Activities   I find it easy to walk up or down two or more flights of stairs. 1    I have no trouble taking out the trash. 1    I do housework such as vacuuming and dusting on my own without difficulty. 4    I can easily lift a gallon of milk (8lbs). 4    I can easily walk a mile. 1    I have no trouble reaching into high cupboards or reaching down to pick up something from the floor. 2    I do not have trouble doing out-door work such as Armed forces logistics/support/administrative officer, raking leaves, or gardening. 4      Mobility and Daily Activities   I feel younger than my age. 3    I feel independent. 3    I feel energetic. 3    I live an active life.  3    I feel strong. 1    I feel healthy. 3    I feel active as other people my age. 4      How fit and strong are you.   Fit and Strong Total Score 37          Past Medical  History:  Diagnosis Date  . Acute blood loss anemia   . Acute lower GI bleeding 03/19/2016  . Arthritis   . Atrial fibrillation with RVR ([place]) 02/21/2016  . Coronary artery disease   . Diverticulosis 04/18/2016  . Endocarditis of native valve   . Former smoker   . H/O gastric ulcer   . History of MI (myocardial infarction) 1992  . Hyperlipidemia   . Hypertension   . Infection of intervertebral disc ([place])   . Insomnia 04/18/2016  . Left retinal detachment 03/18/2015  . Loose body of right shoulder   . 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 Pseudophakia of left eye 05/22/2013  . Pseudophakia of right eye 06/05/2013  . S/P coronary angioplasty   . Streptococcal bacteremia    Past Surgical History:  Procedure Laterality Date  . CARDIOVASCULAR STRESS TEST  08/22/2008  . CORONARY ANGIOPLASTY  1992  . ERCP  W/ SPHICTEROTOMY  12/15/2002  . LAPAROSCOPIC CHOLECYSTECTOMY  12/16/2002  . LUMBAR LAMINECTOMY     L5  . TEE WITHOUT CARDIOVERSION N/A 02/28/2016   Procedure: TRANSESOPHAGEAL ECHOCARDIOGRAM (TEE);  Surgeon: Fay Records, MD;  Location: [facility] ENDOSCOPY;  Service: Cardiovascular;  Laterality: N/A;   Social History   Tobacco Use  Smoking Status Former Smoker  . Packs/day: 2.00  . Years: 20.00  . Pack years: 40.00  . Types: Cigarettes  . Quit date: 03/27/1990  . Years since quitting: 29.9  Smokeless Tobacco Never Used    Came by office at [facility] inquiring about PREP. Previously completed intake but did not attend sessions. Agreeable to starting class on Monday. Will reach out to Cards for referral.    Barnett Hatter 02/22/2020, 12:26 PM

## 2020-03-04 NOTE — Progress Notes (Signed)
[facility] PREP Weekly Session   Patient Details  Name: Jonathan Holden MRN: [id number] Date of Birth: 08-21-1945 Age: 75 y.o. PCP: Vivi Barrack, MD  Vitals:   03/04/20 1639  Weight: 211 lb (95.7 kg)     [facility] [employer] Weekly seesion - 03/04/20 1600      Weekly Session   Topic Discussed Importance of resistance training;Other ways to be active    Minutes exercised this week 20 minutes    Classes attended to date 2          Weekly progress notes reviewed   Barnett Hatter 03/04/2020, 4:40 PM

## 2020-03-11 NOTE — Progress Notes (Signed)
[facility] PREP Weekly Session   Patient Details  Name: Jonathan Holden MRN: [id number] Date of Birth: December 05, 1945 Age: 75 y.o. PCP: Vivi Barrack, MD  Vitals:   03/11/20 1202  Weight: 212 lb (96.2 kg)     [facility] Weekly seesion - 03/11/20 1200      Weekly Session   Topic Discussed Healthy eating tips    Minutes exercised this week 55 minutes    Classes attended to date [place] 03/11/2020, 12:03 PM

## 2020-03-18 NOTE — Progress Notes (Signed)
[facility] PREP Weekly Session   Patient Details  Name: Jonathan Holden MRN: [id number] Date of Birth: [birth year] Age: 75 y.o. PCP: Vivi Barrack, MD  Vitals:   03/18/20 1258  Weight: 214 lb (97.1 kg)     [facility] Weekly seesion - 03/18/20 1200      Weekly Session   Topic Discussed Health habits;Water    Minutes exercised this week 30 minutes    Classes attended to date [place] 03/18/2020, 1:00 PM

## 2020-03-25 NOTE — Progress Notes (Signed)
[facility] PREP Weekly Session   Patient Details  Name: Jonathan Holden MRN: [id number] Date of Birth: [birth year] Age: 75 y.o. PCP: Vivi Barrack, MD  Vitals:   03/25/20 1429  Weight: 215 lb (97.5 kg)     [facility] Weekly seesion - 03/25/20 1400      Weekly Session   Topic Discussed Restaurant Eating    Minutes exercised this week 150 minutes   reports daily stretching   Classes attended to date [place] 03/25/2020, 2:32 PM

## 2020-04-08 ENCOUNTER — Other Ambulatory Visit: Payer: Self-pay | Admitting: Interventional Cardiology

## 2020-04-08 NOTE — Progress Notes (Signed)
[facility] PREP Weekly Session   Patient Details  Name: Jonathan Holden MRN: [id number] Date of Birth: 10/17/1945 Age: 75 y.o. PCP: Vivi Barrack, MD  There were no vitals filed for this visit.   [facility] Weekly seesion - 04/08/20 1100      Weekly Session   Topic Discussed Expectations and non-scale victories    Minutes exercised this week --   Cardio/walking and stretching   Classes attended to date [place] 04/08/2020, 11:36 AM

## 2020-04-11 ENCOUNTER — Other Ambulatory Visit: Payer: Self-pay

## 2020-04-11 NOTE — Telephone Encounter (Signed)
Pt is stating that Dr. Tamala Julian increased his medication for lisinopril and metoprolol, to 1 tablet daily. This was not done. Does pt supposed to be taking 1 tablet daily for lisinopril and metoprolol? Please address

## 2020-04-12 ENCOUNTER — Other Ambulatory Visit: Payer: Self-pay

## 2020-04-12 MED ORDER — LISINOPRIL 10 MG PO TABS: 5.0000 mg | ORAL_TABLET | Freq: Every day | ORAL | 0 refills | Status: DC

## 2020-04-12 MED ORDER — METOPROLOL SUCCINATE ER 25 MG PO TB24: 12.5000 mg | ORAL_TABLET | Freq: Every day | ORAL | 0 refills | Status: DC

## 2020-04-12 NOTE — Telephone Encounter (Signed)
At last visit pt was on half tablet of each of these medications.  Was told to monitor BP and call us with readings.  If readings were elevated, we discussed possibly increasing dose.  Doesn't look like pt called with those readings.  I don't see where we ever instructed him to increase the dose.  He should be taking a half tablet of each.

## 2020-04-13 DIAGNOSIS — M654 Radial styloid tenosynovitis [de Quervain]: Secondary | ICD-10-CM | POA: Diagnosis not present

## 2020-04-29 NOTE — Progress Notes (Signed)
[facility] PREP Weekly Session   Patient Details  Name: Jonathan Holden MRN: [id number] Date of Birth: 09-05-1945 Age: 75 y.o. PCP: Vivi Barrack, MD  Vitals:   04/29/20 1000  Weight: 214 lb (97.1 kg)     [facility] Weekly seesion - 04/29/20 1100      Weekly Session   Topic Discussed Calorie breakdown    Minutes exercised this week --   walking and stretching   Classes attended to date [place] 04/29/2020, 11:55 AM

## 2020-05-26 ENCOUNTER — Other Ambulatory Visit: Payer: Self-pay | Admitting: Interventional Cardiology

## 2020-05-27 NOTE — Progress Notes (Signed)
[place] Report   Patient Details  Name: Jonathan Holden MRN: [id number] Date of Birth: 24-Aug-1945 Age: 75 y.o. PCP: Vivi Barrack, MD  Vitals:   05/27/20 1245  BP: 138/82  Pulse: 84  Weight: 215 lb (97.5 kg)      [facility] Eval - 05/27/20 1200      Measurement   Neck measurement 16 Inches    Waist Circumference 46 inches      Mobility and Daily Activities   I find it easy to walk up or down two or more flights of stairs. 4    I have no trouble taking out the trash. 4    I do housework such as vacuuming and dusting on my own without difficulty. 4    I can easily lift a gallon of milk (8lbs). 4    I can easily walk a mile. 2    I have no trouble reaching into high cupboards or reaching down to pick up something from the floor. 4    I do not have trouble doing out-door work such as Armed forces logistics/support/administrative officer, raking leaves, or gardening. 4      Mobility and Daily Activities   I feel younger than my age. 4    I feel independent. 4    I feel energetic. 4    I live an active life.  4    I feel strong. 2    I feel healthy. 2    I feel active as other people my age. 4      How fit and strong are you.   Fit and Strong Total Score 50          Past Medical History:  Diagnosis Date  . Acute blood loss anemia   . Acute lower GI bleeding 03/19/2016  . Arthritis   . Atrial fibrillation with RVR ([place]) 02/21/2016  . Coronary artery disease   . Diverticulosis 04/18/2016  . Endocarditis of native valve   . Former smoker   . H/O gastric ulcer   . History of MI (myocardial infarction) 1992  . Hyperlipidemia   . Hypertension   . Infection of intervertebral disc ([place])   . Insomnia 04/18/2016  . Left retinal detachment 03/18/2015  . Loose body of right shoulder   . 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 Pseudophakia of left eye 05/22/2013  . Pseudophakia of right eye 06/05/2013  . S/P coronary angioplasty   . Streptococcal bacteremia    Past  Surgical History:  Procedure Laterality Date  . CARDIOVASCULAR STRESS TEST  08/22/2008  . CORONARY ANGIOPLASTY  1992  . ERCP W/ SPHICTEROTOMY  12/15/2002  . LAPAROSCOPIC CHOLECYSTECTOMY  12/16/2002  . LUMBAR LAMINECTOMY     L5  . TEE WITHOUT CARDIOVERSION N/A 02/28/2016   Procedure: TRANSESOPHAGEAL ECHOCARDIOGRAM (TEE);  Surgeon: Fay Records, MD;  Location: [facility] ENDOSCOPY;  Service: Cardiovascular;  Laterality: N/A;   Social History   Tobacco Use  Smoking Status Former Smoker  . Packs/day: 2.00  . Years: 20.00  . Pack years: 40.00  . Types: Cigarettes  . Quit date: 03/27/1990  . Years since quitting: 30.1  Smokeless Tobacco Never Used      Yevonne Aline 05/27/2020, 12:47 PM

## 2020-06-04 ENCOUNTER — Telehealth: Payer: Self-pay | Admitting: Interventional Cardiology

## 2020-06-04 ENCOUNTER — Other Ambulatory Visit: Payer: Self-pay | Admitting: Interventional Cardiology

## 2020-06-04 DIAGNOSIS — I10 Essential (primary) hypertension: Secondary | ICD-10-CM

## 2020-06-04 MED ORDER — PRALUENT 150 MG/ML ~~LOC~~ SOAJ
1.0000 | SUBCUTANEOUS | 3 refills | Status: DC
Start: 2020-06-04 — End: 2021-04-07

## 2020-06-04 NOTE — Telephone Encounter (Signed)
*  STAT* If patient is at the pharmacy, call can be transferred to refill team.   1. Which medications need to be refilled? (please list name of each medication and dose if known)  metoprolol succinate (TOPROL-XL) 25 MG 24 hr tablet lisinopril (ZESTRIL) 10 MG tablet Alirocumab (PRALUENT) 150 MG/ML SOAJ  2. Which pharmacy/location (including street and city if local pharmacy) is medication to be sent to? [id number] [place], [place] - [address]  3. Do they need a 30 day or 90 day supply? 90 day supply  PT is all out of the medication.

## 2020-06-04 NOTE — Telephone Encounter (Signed)
Left message to call back  

## 2020-06-04 NOTE — Telephone Encounter (Signed)
*  STAT* If patient is at the pharmacy, call can be transferred to refill team.   1. Which medications need to be refilled? (please list name of each medication and dose if known)  New prescriptions with new directions for Lisinopril 10 mg  1 tablet daily and Metoprolol 25 mg take 1 tablet daily  2. Which pharmacy/location (including street and city if local pharmacy) is medication to be sent to? [facility] [place]  3. Do they need a 30 day or 90 day supply?90 days and refills- pt is completely out

## 2020-06-04 NOTE — Telephone Encounter (Signed)
Pt calling stating that his medications lisinopril and metoprolol was increased to 1 tablet daily. This change is not on the medication list. Please address

## 2020-06-05 ENCOUNTER — Other Ambulatory Visit: Payer: Medicare Other | Admitting: *Deleted

## 2020-06-05 ENCOUNTER — Other Ambulatory Visit: Payer: Self-pay

## 2020-06-05 DIAGNOSIS — I10 Essential (primary) hypertension: Secondary | ICD-10-CM | POA: Diagnosis not present

## 2020-06-05 MED ORDER — METOPROLOL SUCCINATE ER 25 MG PO TB24: 25.0000 mg | ORAL_TABLET | Freq: Every day | ORAL | 0 refills | Status: DC

## 2020-06-05 MED ORDER — LISINOPRIL 10 MG PO TABS: 10.0000 mg | ORAL_TABLET | Freq: Every day | ORAL | 0 refills | Status: DC

## 2020-06-05 NOTE — Telephone Encounter (Signed)
PT is returning a call 

## 2020-06-05 NOTE — Telephone Encounter (Signed)
Spoke with pt and he states when he was here last, Dr. Tamala Julian had mentioned possibly having to increase Lisinopril and/or Metoprolol to a full tablet.  Pt states he has been taking a full tablet of both medications for about 6 months.  BP and HR have been running fine.  States he feels great.  Took vitals while on the phone with me but hasn't had medications today.  BP 147/88, HR 83.  Advised I will send message to Dr. Tamala Julian to get the ok to send in new refills for the increased doses of medication.

## 2020-06-05 NOTE — Telephone Encounter (Signed)
Clarified with Dr. Tamala Julian that it is ok to send in the higher doses.  Spoke with pt and scheduled him to come in for lab work today.

## 2020-06-05 NOTE — Telephone Encounter (Signed)
If taking a higher dose of Lisinopril, need to check BMET.

## 2020-06-06 LAB — BASIC METABOLIC PANEL
BUN/Creatinine Ratio: 19 (ref 10–24)
BUN: 18 mg/dL (ref 8–27)
CO2: 22 mmol/L (ref 20–29)
Calcium: 9.7 mg/dL (ref 8.6–10.2)
Chloride: 101 mmol/L (ref 96–106)
Creatinine, Ser: 0.93 mg/dL (ref 0.76–1.27)
Glucose: 94 mg/dL (ref 65–99)
Potassium: 4.6 mmol/L (ref 3.5–5.2)
Sodium: 137 mmol/L (ref 134–144)
eGFR: 86 mL/min/{1.73_m2} (ref >59)

## 2020-06-20 DIAGNOSIS — M1711 Unilateral primary osteoarthritis, right knee: Secondary | ICD-10-CM | POA: Diagnosis not present

## 2020-06-27 DIAGNOSIS — M179 Osteoarthritis of knee, unspecified: Secondary | ICD-10-CM | POA: Diagnosis not present

## 2020-06-27 DIAGNOSIS — M1711 Unilateral primary osteoarthritis, right knee: Secondary | ICD-10-CM | POA: Diagnosis not present

## 2020-07-04 DIAGNOSIS — M179 Osteoarthritis of knee, unspecified: Secondary | ICD-10-CM | POA: Diagnosis not present

## 2020-07-04 DIAGNOSIS — M1711 Unilateral primary osteoarthritis, right knee: Secondary | ICD-10-CM | POA: Diagnosis not present

## 2020-07-06 DIAGNOSIS — Z23 Encounter for immunization: Secondary | ICD-10-CM | POA: Diagnosis not present

## 2020-07-23 ENCOUNTER — Telehealth: Payer: Self-pay | Admitting: Interventional Cardiology

## 2020-07-23 NOTE — Telephone Encounter (Signed)
Returned call to patient who called to tell us that he his having excessive skin tearing with minor injuries and minor trauma. States he has a history of bruising easily. I verified that he is not taking aspirin or any other blood thinning or anti-platelet medications which he confirmed. I advised that he reach out to his PCP and make an appointment for lab work for bleeding or clotting disorders. He states he has an appointment on Thursday and asked that I note this in his chart and forward to Dr. Tamala Julian. He thanked me for the call.

## 2020-07-23 NOTE — Telephone Encounter (Signed)
Patient is asking that nurse give him a call. Wouldn't say what it is, just wanting to speak to the nurse.

## 2020-07-25 ENCOUNTER — Other Ambulatory Visit: Payer: Self-pay

## 2020-07-25 ENCOUNTER — Encounter: Payer: Self-pay | Admitting: Family Medicine

## 2020-07-25 ENCOUNTER — Ambulatory Visit (INDEPENDENT_AMBULATORY_CARE_PROVIDER_SITE_OTHER): Payer: Medicare Other | Admitting: Family Medicine

## 2020-07-25 VITALS — BP 138/80 | HR 71 | Temp 97.7°F | Ht 74.0 in | Wt 215.8 lb

## 2020-07-25 DIAGNOSIS — I480 Paroxysmal atrial fibrillation: Secondary | ICD-10-CM | POA: Diagnosis not present

## 2020-07-25 DIAGNOSIS — E785 Hyperlipidemia, unspecified: Secondary | ICD-10-CM

## 2020-07-25 DIAGNOSIS — I509 Heart failure, unspecified: Secondary | ICD-10-CM

## 2020-07-25 DIAGNOSIS — M1990 Unspecified osteoarthritis, unspecified site: Secondary | ICD-10-CM

## 2020-07-25 DIAGNOSIS — R2681 Unsteadiness on feet: Secondary | ICD-10-CM | POA: Insufficient documentation

## 2020-07-25 DIAGNOSIS — I25118 Atherosclerotic heart disease of native coronary artery with other forms of angina pectoris: Secondary | ICD-10-CM

## 2020-07-25 DIAGNOSIS — I25119 Atherosclerotic heart disease of native coronary artery with unspecified angina pectoris: Secondary | ICD-10-CM

## 2020-07-25 DIAGNOSIS — I10 Essential (primary) hypertension: Secondary | ICD-10-CM

## 2020-07-25 LAB — COMPREHENSIVE METABOLIC PANEL
ALT: 15 U/L (ref 0–53)
AST: 21 U/L (ref 0–37)
Albumin: 4.3 g/dL (ref 3.5–5.2)
Alkaline Phosphatase: 67 U/L (ref 39–117)
BUN: 19 mg/dL (ref 6–23)
CO2: 26 mEq/L (ref 19–32)
Calcium: 9.8 mg/dL (ref 8.4–10.5)
Chloride: 104 mEq/L (ref 96–112)
Creatinine, Ser: 1 mg/dL (ref 0.40–1.50)
GFR: 73.38 mL/min (ref >60.00)
Glucose, Bld: 92 mg/dL (ref 70–99)
Potassium: 4.6 mEq/L (ref 3.5–5.1)
Sodium: 139 mEq/L (ref 135–145)
Total Bilirubin: 0.3 mg/dL (ref 0.2–1.2)
Total Protein: 7.3 g/dL (ref 6.0–8.3)

## 2020-07-25 LAB — CBC
HCT: 41.1 % (ref 39.0–52.0)
Hemoglobin: 13.8 g/dL (ref 13.0–17.0)
MCHC: 33.7 g/dL (ref 30.0–36.0)
MCV: 96.5 fl (ref 78.0–100.0)
Platelets: 199 10*3/uL (ref 150.0–400.0)
RBC: 4.26 Mil/uL (ref 4.22–5.81)
RDW: 14.8 % (ref 11.5–15.5)
WBC: 5.7 10*3/uL (ref 4.0–10.5)

## 2020-07-25 LAB — TSH: TSH: 1.54 u[IU]/mL (ref 0.35–5.50)

## 2020-07-25 LAB — LIPID PANEL
Cholesterol: 158 mg/dL (ref 0–200)
HDL: 43.3 mg/dL (ref >39.00)
NonHDL: 114.76
Total CHOL/HDL Ratio: 4
Triglycerides: 229 mg/dL — ABNORMAL HIGH (ref 0.0–149.0)
VLDL: 45.8 mg/dL — ABNORMAL HIGH (ref 0.0–40.0)

## 2020-07-25 LAB — LDL CHOLESTEROL, DIRECT: Direct LDL: 80 mg/dL

## 2020-07-25 MED ORDER — CLOBETASOL PROPIONATE 0.05 % EX OINT: 1.0000 "application " | TOPICAL_OINTMENT | Freq: Two times a day (BID) | CUTANEOUS | 2 refills | Status: DC

## 2020-07-25 NOTE — Assessment & Plan Note (Signed)
Continue management per cardiology. 

## 2020-07-25 NOTE — Assessment & Plan Note (Signed)
No signs of volume overload.  Continue management per cardiology. 

## 2020-07-25 NOTE — Assessment & Plan Note (Signed)
Ongoing for several years but is worsened recently.  Will place referral to physical therapy for further evaluation/management.

## 2020-07-25 NOTE — Assessment & Plan Note (Signed)
At goal on lisinopril 10 mg daily and metoprolol succinate 25 mg daily.

## 2020-07-25 NOTE — Patient Instructions (Signed)
It was very nice to see you today!  We will check blood work today.  I will refer you to see physical therapy.  We will refill your clobetasol.  Please come back to see me in 1 year for your next yearly visit.  Please come back to see me sooner if needed.  Take care, Dr Jerline Pain  PLEASE NOTE:  If you had any lab tests please let us know if you have not heard back within a few days. You may see your results on mychart before we have a chance to review them but we will give you a call once they are reviewed by [place]. If we ordered any referrals today, please let us know if you have not heard from their office within the next week.   Please try these tips to maintain a healthy lifestyle:  Eat at least 3 REAL meals and 1-2 snacks per day.  Aim for no more than 5 hours between eating.  If you eat breakfast, please do so within one hour of getting up.   Each meal should contain half fruits/vegetables, one quarter protein, and one quarter carbs (no bigger than a computer mouse)  Cut down on sweet beverages. This includes juice, soda, and sweet tea.   Drink at least 1 glass of water with each meal and aim for at least 8 glasses per day  Exercise at least 150 minutes every week.

## 2020-07-25 NOTE — Assessment & Plan Note (Addendum)
Check labs.  On praluent per cardiology.

## 2020-07-25 NOTE — Assessment & Plan Note (Signed)
Regular rate and rhythm today.  Follows with cardiology.

## 2020-07-25 NOTE — Progress Notes (Signed)
   Jonathan Holden is a 75 y.o. male who presents today for an office visit.  Assessment/Plan:  New/Acute Problems: Skin Tear Appears to be healing normally.  Healthy granulation tissue present with no signs of infection.  We will continue bandaging at home.  We discussed strategies to avoid skin tears in the future including wearing long sleeves and pants as much as tolerated as well as daily lotion.  Chronic Problems Addressed Today: HTN (hypertension) At goal on lisinopril 10 mg daily and metoprolol succinate 25 mg daily.  Hyperlipidemia LDL goal <70, on Praluent Check labs.  On praluent per cardiology.  Unsteady gait Ongoing for several years but is worsened recently.  Will place referral to physical therapy for further evaluation/management.  CHF (congestive heart failure) (HCC) No signs of volume overload.  Continue management per cardiology.  Paroxysmal atrial fibrillation with RVR (HCC) Regular rate and rhythm today.  Follows with cardiology.  Coronary artery disease involving native coronary artery of native heart with angina pectoris [facility]) Continue management per cardiology.     Subjective:  HPI:  See A/P for status of chronic conditions  He has had ongoing issues with unsteady gait.  This started a few years ago after having a systemic bacterial infection resulting in compression on his spinal cord.  He worked with physical therapy at that time.  He has had 1 recent fall related to hitting his head while working on his car.  Suffered a skin tear on his left elbow as a result of this.  He is interested in seeing physical therapy again.       Objective:  Physical Exam: BP 138/80   Pulse 71   Temp 97.7 F (36.5 C) (Temporal)   Ht 6\' 2"  (1.88 m)   Wt 215 lb 12.8 oz (97.9 kg)   SpO2 95%   BMI 27.71 kg/m   Wt Readings from Last 3 Encounters:  07/25/20 215 lb 12.8 oz (97.9 kg)  05/27/20 215 lb (97.5 kg)  04/29/20 214 lb (97.1 kg)    Gen: No acute distress,  resting comfortably CV: Regular rate and rhythm with no murmurs appreciated Pulm: Normal work of breathing, clear to auscultation bilaterally with no crackles, wheezes, or rhonchi Skin: Approximately 3 cm skin tear on the left lateral elbow.  Granulation tissue present.  No surrounding erythema. Neuro: Grossly normal, moves all extremities Psych: Normal affect and thought content      Roniesha Hollingshead M. Jerline Pain, MD 07/25/2020 10:12 AM

## 2020-07-26 NOTE — Progress Notes (Signed)
Please inform patient of the following:  His cholesterol is up a bit but otherwise all of his other labs are stable.  Recommend he follow-up with cardiology soon.  We can recheck everything in a year.

## 2020-08-01 ENCOUNTER — Other Ambulatory Visit: Payer: Self-pay

## 2020-08-01 ENCOUNTER — Encounter: Payer: Self-pay | Admitting: Physical Therapy

## 2020-08-01 ENCOUNTER — Ambulatory Visit (INDEPENDENT_AMBULATORY_CARE_PROVIDER_SITE_OTHER): Payer: Medicare Other | Admitting: Physical Therapy

## 2020-08-01 DIAGNOSIS — M25561 Pain in right knee: Secondary | ICD-10-CM

## 2020-08-01 DIAGNOSIS — G8929 Other chronic pain: Secondary | ICD-10-CM | POA: Diagnosis not present

## 2020-08-01 DIAGNOSIS — M6281 Muscle weakness (generalized): Secondary | ICD-10-CM

## 2020-08-01 DIAGNOSIS — R2689 Other abnormalities of gait and mobility: Secondary | ICD-10-CM | POA: Diagnosis not present

## 2020-08-07 ENCOUNTER — Other Ambulatory Visit: Payer: Self-pay

## 2020-08-07 ENCOUNTER — Ambulatory Visit (INDEPENDENT_AMBULATORY_CARE_PROVIDER_SITE_OTHER): Payer: Medicare Other | Admitting: Physical Therapy

## 2020-08-07 DIAGNOSIS — M6281 Muscle weakness (generalized): Secondary | ICD-10-CM

## 2020-08-07 DIAGNOSIS — R2689 Other abnormalities of gait and mobility: Secondary | ICD-10-CM

## 2020-08-07 DIAGNOSIS — G8929 Other chronic pain: Secondary | ICD-10-CM

## 2020-08-07 DIAGNOSIS — M25561 Pain in right knee: Secondary | ICD-10-CM | POA: Diagnosis not present

## 2020-08-13 ENCOUNTER — Ambulatory Visit (INDEPENDENT_AMBULATORY_CARE_PROVIDER_SITE_OTHER): Payer: Medicare Other | Admitting: Physical Therapy

## 2020-08-13 ENCOUNTER — Other Ambulatory Visit: Payer: Self-pay

## 2020-08-13 DIAGNOSIS — R2689 Other abnormalities of gait and mobility: Secondary | ICD-10-CM | POA: Diagnosis not present

## 2020-08-13 DIAGNOSIS — M6281 Muscle weakness (generalized): Secondary | ICD-10-CM

## 2020-08-13 DIAGNOSIS — M25561 Pain in right knee: Secondary | ICD-10-CM | POA: Diagnosis not present

## 2020-08-13 DIAGNOSIS — G8929 Other chronic pain: Secondary | ICD-10-CM | POA: Diagnosis not present

## 2020-08-15 ENCOUNTER — Encounter: Payer: Self-pay | Admitting: Physical Therapy

## 2020-08-15 ENCOUNTER — Other Ambulatory Visit: Payer: Self-pay

## 2020-08-15 ENCOUNTER — Ambulatory Visit (INDEPENDENT_AMBULATORY_CARE_PROVIDER_SITE_OTHER): Payer: Medicare Other | Admitting: Physical Therapy

## 2020-08-15 DIAGNOSIS — M6281 Muscle weakness (generalized): Secondary | ICD-10-CM | POA: Diagnosis not present

## 2020-08-15 DIAGNOSIS — R2689 Other abnormalities of gait and mobility: Secondary | ICD-10-CM | POA: Diagnosis not present

## 2020-08-15 DIAGNOSIS — M25561 Pain in right knee: Secondary | ICD-10-CM

## 2020-08-15 DIAGNOSIS — G8929 Other chronic pain: Secondary | ICD-10-CM | POA: Diagnosis not present

## 2020-08-15 NOTE — Therapy (Signed)
[facility] [address] [place], [place], [address] Phone: [phone]   Fax:  [phone]  Physical Therapy Treatment  Patient Details  Name: Jonathan Holden MRN: [id number] Date of Birth: 1945/06/30 Referring Provider (PT): Dimas Chyle   Encounter Date: 08/07/2020   PT End of Session - 08/15/20 1233     Visit Number 2    Number of Visits 12    Date for PT Re-Evaluation 09/12/20    Authorization Type Medicare    PT Start Time [id number]    PT Stop Time [id number]    PT Time Calculation (min) 41 min    Activity Tolerance Patient tolerated treatment well    Behavior During Therapy [facility] for tasks assessed/performed             Past Medical History:  Diagnosis Date   Acute blood loss anemia    Acute lower GI bleeding 03/19/2016   Arthritis    Atrial fibrillation with RVR ([place]) 02/21/2016   Coronary artery disease    Diverticulosis 04/18/2016   Endocarditis of native valve    Former smoker    H/O gastric ulcer    History of MI (myocardial infarction) 1992   Hyperlipidemia    Hypertension    Infection of intervertebral disc ([place])    Insomnia 04/18/2016   Left retinal detachment 03/18/2015   Loose body of right shoulder    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   Subjective Assessment - 08/15/20 1233     Subjective  Pt with no new complaints.    Currently in Pain? Yes    Pain Score 2     Pain Location Knee    Pain Orientation Right    Pain Descriptors / Indicators Aching    Pain Type Chronic pain    Pain Onset More than a month ago    Pain Frequency Intermittent                [facility] PT Assessment - 08/15/20 1238       Standardized Balance Assessment   Standardized Balance Assessment Dynamic Gait Index      Dynamic Gait Index   Level Surface Normal    Change in Gait Speed Mild Impairment    Gait with Horizontal Head Turns Mild Impairment    Gait with Vertical Head Turns Mild Impairment    Gait and Pivot Turn Mild Impairment    Step Over Obstacle Mild Impairment    Step Around Obstacles Mild Impairment    Steps Mild Impairment    Total Score 17                           [facility] Adult PT Treatment/Exercise - 08/15/20 1235       Knee/Hip Exercises: Stretches   Piriformis Stretch 2 reps;30 seconds  Piriformis Stretch Limitations supine modified fig 4 stretch(support for R knee)      Knee/Hip Exercises: Standing   Heel Raises 20 reps    Hip Flexion 20 reps;Knee bent    Other Standing Knee Exercises 1/2 Tandem stance 30 sec x 3      Knee/Hip Exercises: Seated   Long Arc Quad 20 reps    Long Arc Quad Weight --    Sit to [employer] 10 reps;without UE support   mat table, education on form     Knee/Hip Exercises: Supine   Bridges --    Straight Leg Raises 10 reps;Both      Knee/Hip Exercises: Sidelying   Hip ABduction 10 reps;Both                    PT Education - 08/15/20 1233     Education Details INitial HEP    Person(s) Educated Patient    Methods Explanation;Demonstration;Tactile cues;Verbal cues;Handout    Comprehension Verbalized understanding;Returned demonstration;Verbal cues required;Tactile cues required;Need further instruction              PT Short Term Goals - 08/15/20 1207       PT SHORT TERM GOAL #1   Title Pt to be  independent wtih initial HEP    Time 2    Period Weeks    Status New    Target Date 08/15/20               PT Long Term Goals - 08/15/20 1239       PT LONG TERM GOAL #1   Title Pt to demo improved time of 5 time Sit to stand by at least 5 sec.    Time 6    Period Weeks    Status New    Target Date 09/12/20      PT LONG TERM GOAL #2   Title Pt to demo improved score on DGI by at least 5 points .    Baseline score: 17 at eval.    Time 6    Period Weeks    Status Revised    Target Date 09/12/20      PT LONG TERM GOAL #3   Title Pt to demo increased strength of bil hips and knees to 5/5, to improve stability, stairs, and endurance.    Time 6    Period Weeks    Status New    Target Date 09/12/20      PT LONG TERM GOAL #4   Title Pt to report ability for standing activity for at least 30 min at home, without requiring seated rest break, to improve abilty for IADLs and community activities.    Time 6    Period Weeks    Status New    Target Date 09/12/20                   Plan - 08/15/20 1241     Clinical Impression Statement Pt given HEP today, educated on LE ther ex for strengthening. Education and practice for sit to stand from chair, improved ability to rise, faster, and with less UE support after practice. DGI also tested today. Pt with deficits with dynamic balance, will benefit from progressive practice for this.    Personal Factors and Comorbidities Comorbidity 1    Comorbidities Afib, CHF, CAD, HTN    Examination-Activity Limitations Lift;Stand;Transfers;Carry;Squat;Stairs    Examination-Participation Restrictions Cleaning;Meal Prep;Yard Work;Community Activity;Shop    Stability/Clinical Decision Making Stable/Uncomplicated  Rehab Potential Good    PT Frequency 2x / week    PT Duration 6 weeks    PT Treatment/Interventions ADLs/Self Care Home Management;Cryotherapy;Moist Heat;Traction;DME Instruction;Balance training;Therapeutic  exercise;Therapeutic activities;Functional mobility training;Stair training;Gait training;Neuromuscular re-education;Patient/family education;Manual techniques;Passive range of motion;Dry needling;Taping;Joint Manipulations;Spinal Manipulations;Vasopneumatic Device    Consulted and Agree with Plan of Care Patient             Patient will benefit from skilled therapeutic intervention in order to improve the following deficits and impairments:  Abnormal gait, Pain, Decreased mobility, Decreased strength, Decreased range of motion, Decreased endurance, Decreased activity tolerance, Impaired vision/preception, Decreased safety awareness  Visit Diagnosis: Other abnormalities of gait and mobility  Muscle weakness (generalized)  Chronic pain of right knee     Problem List Patient Active Problem List   Diagnosis Date Noted   Unsteady gait 07/25/2020   CHF (congestive heart failure) ([place]) 07/25/2019   Cold sore 07/25/2019   Diverticulosis 04/18/2016   Insomnia 04/18/2016   Osteoarthritis 04/18/2016   H/O gastric ulcer    Former smoker    Gastroesophageal reflux disease    Paroxysmal atrial fibrillation with RVR ([place]) 02/21/2016   Coronary artery disease involving native coronary artery of native heart with angina pectoris ([place]) 09/19/2014   HTN (hypertension) 09/19/2014   Hyperlipidemia LDL goal <70, on Praluent 10/03/2013   Lyndee Hensen, PT, DPT 12:42 PM  08/15/20    [facility] [address] [place], [place], [address] Phone: [phone]   Fax:  [phone]  Name: Jonathan Holden MRN: [id number] Date of Birth: November 11, 1945

## 2020-08-15 NOTE — Therapy (Signed)
[facility] [address] [place], [place], [address] Phone: [phone]   Fax:  [phone]  Physical Therapy Evaluation  Patient Details  Name: Jonathan Holden MRN: [id number] Date of Birth: February 14, 1945 Referring Provider (PT): Dimas Chyle   Encounter Date: 08/01/2020   PT End of Session - 08/15/20 1204     Visit Number 1    Number of Visits 12    Date for PT Re-Evaluation 09/12/20    Authorization Type Medicare    PT Start Time 0846    PT Stop Time 0930    PT Time Calculation (min) 44 min    Activity Tolerance Patient tolerated treatment well    Behavior During Therapy [facility] for tasks assessed/performed             Past Medical History:  Diagnosis Date   Acute blood loss anemia    Acute lower GI bleeding 03/19/2016   Arthritis    Atrial fibrillation with RVR ([place]) 02/21/2016   Coronary artery disease    Diverticulosis 04/18/2016   Endocarditis of native valve    Former smoker    H/O gastric ulcer    History of MI (myocardial infarction) 1992   Hyperlipidemia    Hypertension    Infection of intervertebral disc ([place])    Insomnia 04/18/2016   Left retinal detachment 03/18/2015   Loose body of right shoulder    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    Subjective Assessment - 08/15/20 1159      Subjective Pt states increased weakness in the last several months, after having bacterial injection, Was hospitalized for 30 days. Did have home health PT. States chronic pain and issues with R knee , usually gets it drained every 6 months, as well as gel injections([place]). Also states L hip pain, thinks some due to pain in R knee.  Feels decreased mobility in last couple years, clumsy, decreased strength, core, legs.  Exercise: would like to get back into doing more. Has had 2 falls but were accidents/slipped, no injuries.    Pertinent History A-fib, CAD, HTN, CHF    Limitations Lifting;Standing;Walking;House hold activities    Patient Stated Goals improved LE strength, increase ability for exercise, improve balance    Currently in Pain? Yes    Pain Score 3     Pain Location Knee    Pain Orientation Right    Pain Descriptors / Indicators Aching    Pain Type Acute pain;Chronic pain    Pain Radiating Towards up to 6/10 at times.    Pain Onset More than a month ago    Pain Frequency Intermittent    Aggravating Factors  standing, walking, increased activity                [facility] PT Assessment - 08/15/20 0001       Assessment  Medical Diagnosis unsteady gait    Referring Provider (PT) Dimas Chyle    Prior Therapy no      Precautions   Precautions None      Restrictions   Weight Bearing Restrictions No      Balance Screen   Has the patient fallen in the past 6 months Yes    How many times? 1    Has the patient had a decrease in activity level because of a fear of falling?  No    Is the patient reluctant to leave their home because of a fear of falling?  No      Prior Function   Level of Independence Independent      Cognition   Overall Cognitive Status Within Functional Limits for tasks assessed      AROM   Overall AROM Comments L shoulder PROM: 140    Right Shoulder Flexion 145 Degrees    Left Shoulder Flexion 110 Degrees    Left Shoulder External Rotation 38  Degrees    Right Knee Extension -5    Right Knee Flexion 105    Left Knee Extension --   wnl   Left Knee Flexion --   wnl     Strength   Overall Strength Comments R knee: 4/5, L knee: 5/5, Hips: 4/5 Shoulders: 4+/5      Palpation   Palpation comment moderate edema noted in R knee; hypomobile for full knee extension on R;      Transfers   Five time sit to stand comments  21.53 sec from regular arm chair, requires use of hands.      Ambulation/Gait   Gait Comments DGI to be tested. for balance                        Objective measurements completed on examination: See above findings.       [facility] Adult PT Treatment/Exercise - 08/15/20 0001       Knee/Hip Exercises: Stretches   Piriformis Stretch --    Piriformis Stretch Limitations --      Knee/Hip Exercises: Standing   Heel Raises --    Other Standing Knee Exercises --      Knee/Hip Exercises: Seated   Long Arc Quad 20 reps    Long Arc Quad Weight 3 lbs.    Sit to Sand 10 reps;without UE support   mat table.     Knee/Hip Exercises: Supine   Quad Sets 10 reps    Quad Sets Limitations for HEP    Bridges 15 reps    Straight Leg Raises 10 reps;2 sets;Both      Knee/Hip Exercises: Sidelying   Hip ABduction 10 reps;2 sets;Both                    PT Education - 08/15/20 1203     Education Details PT POC, Exam findings,    Person(s) Educated Patient    Methods Explanation;Demonstration    Comprehension Verbalized understanding;Returned demonstration              PT Short Term Goals - 08/15/20 1207       PT SHORT TERM GOAL #1   Title Pt to be independent wtih initial HEP    Time 2    Period Weeks    Status New    Target Date 08/15/20               PT Long Term Goals -  08/15/20 1212       PT LONG TERM GOAL #1   Title Pt to demo improved time of 5 time Sit to stand by at least 5 sec.    Time 6    Period Weeks    Status New    Target Date 09/12/20      PT LONG TERM  GOAL #2   Title Pt to demo improved score on DGI- tbd after testing.    Time 6    Period Weeks    Status New    Target Date 09/12/20      PT LONG TERM GOAL #3   Title Pt to demo increased strength of bil hips and knees to 5/5, to improve stability, stairs, and endurance.    Time 6    Period Weeks    Status New    Target Date 09/12/20      PT LONG TERM GOAL #4   Title Pt to report ability for standing activity for at least 30 min at home, without requiring seated rest break, to improve abilty for IADLs and community activities.    Time 6    Period Weeks    Status New    Target Date 09/12/20                    Plan - 08/15/20 1222     Clinical Impression Statement Pt presents with primary complaint of decreased strength, following hospitalization. Pt with decreased endurance for standing , outdoor, and community activities. Pt with decreased confidence with balance, stairs, changing directions, and uneven surfaces. Pt with decreased score on 5 time sit to stand today. Dynamic balance test to be done next visit. Pt with decreased strength in LEs, as well as increased pain and deficits in R knee from OA. Pt to benefit from skilled PT to improve deficits, and improve safety with functional activites.    Personal Factors and Comorbidities Comorbidity 1    Comorbidities Afib, CHF, CAD, HTN    Examination-Activity Limitations Lift;Stand;Transfers;Carry;Squat;Stairs    Examination-Participation Restrictions Cleaning;Meal Prep;Yard Work;Community Activity;Shop    Stability/Clinical Decision Making Stable/Uncomplicated    Clinical Decision Making Low    Rehab Potential Good    PT Frequency 2x / week    PT Duration 6 weeks    PT Treatment/Interventions ADLs/Self Care Home Management;Cryotherapy;Moist Heat;Traction;DME Instruction;Balance training;Therapeutic exercise;Therapeutic activities;Functional mobility training;Stair training;Gait training;Neuromuscular  re-education;Patient/family education;Manual techniques;Passive range of motion;Dry needling;Taping;Joint Manipulations;Spinal Manipulations;Vasopneumatic Device    Consulted and Agree with Plan of Care Patient             Patient will benefit from skilled therapeutic intervention in order to improve the following deficits and impairments:  Abnormal gait, Pain, Decreased mobility, Decreased strength, Decreased range of motion, Decreased endurance, Decreased activity tolerance, Impaired vision/preception, Decreased safety awareness  Visit Diagnosis: Other abnormalities of gait and mobility  Muscle weakness (generalized)  Chronic pain of right knee     Problem List Patient Active Problem List   Diagnosis Date Noted   Unsteady gait 07/25/2020   CHF (congestive heart failure) ([place]) 07/25/2019   Cold sore 07/25/2019   Diverticulosis 04/18/2016   Insomnia 04/18/2016   Osteoarthritis 04/18/2016   H/O gastric ulcer    Former smoker    Gastroesophageal reflux disease    Paroxysmal atrial fibrillation with RVR ([place]) 02/21/2016   Coronary artery disease involving native coronary artery of native heart with angina pectoris ([place]) 09/19/2014   HTN (hypertension) 09/19/2014   Hyperlipidemia LDL goal <  70, on Praluent 10/03/2013    Lyndee Hensen, PT, DPT 12:30 PM  08/15/20    [facility] [place], [place], [address] Phone: [phone]   Fax:  [phone]  Name: Jonathan Holden MRN: [id number] Date of Birth: 1945-02-16

## 2020-08-15 NOTE — Patient Instructions (Signed)
Access Code: [email or web address] URL: [email or web address] Date: 08/07/2020 Prepared by: Lyndee Hensen  Exercises Seated Knee Extension AROM - 1 x daily - 2 sets - 10 reps Heel Raises with Unilateral Counter Support - 1 x daily - 2 sets - 10 reps Standing Marching - 1 x daily - 2 sets - 10 reps Straight Leg Raise - 1 x daily - 1-2 sets - 10 reps Sidelying Hip Abduction - 2 x daily - 2 sets - 10 reps Supine Piriformis Stretch Pulling Heel to Hip - 2 x daily - 3 reps - 30 hold

## 2020-08-16 ENCOUNTER — Encounter: Payer: Self-pay | Admitting: Physical Therapy

## 2020-08-16 NOTE — Therapy (Signed)
[facility] [address] [place], [place], [address] Phone: [phone]   Fax:  [phone]  Physical Therapy Treatment  Patient Details  Name: Jonathan Holden MRN: [id number] Date of Birth: [birth year] Referring Provider (PT): Dimas Chyle   Encounter Date: 08/13/2020   PT End of Session - 08/16/20 1033     Visit Number 3    Number of Visits 12    Date for PT Re-Evaluation 09/12/20    Authorization Type Medicare    PT Start Time 0802    PT Stop Time [id number]    PT Time Calculation (min) 42 min    Activity Tolerance Patient tolerated treatment well    Behavior During Therapy [facility] for tasks assessed/performed             Past Medical History:  Diagnosis Date   Acute blood loss anemia    Acute lower GI bleeding 03/19/2016   Arthritis    Atrial fibrillation with RVR ([place]) 02/21/2016   Coronary artery disease    Diverticulosis 04/18/2016   Endocarditis of native valve    Former smoker    H/O gastric ulcer    History of MI (myocardial infarction) 1992   Hyperlipidemia    Hypertension    Infection of intervertebral disc ([place])    Insomnia 04/18/2016   Left retinal detachment 03/18/2015   Loose body of right shoulder    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   Subjective Assessment - 08/16/20 1028     Subjective  Pt with no new complaints. Has been doing HEP.    Currently in Pain? Yes    Pain Score 2     Pain Location Knee    Pain Orientation Right    Pain Descriptors / Indicators Aching    Pain Type Chronic pain    Pain Onset More than a month ago    Pain Frequency Intermittent                               [facility] Adult PT Treatment/Exercise - 08/16/20 0001       Knee/Hip Exercises: Stretches   Piriformis Stretch 2 reps;30 seconds    Piriformis Stretch Limitations supine modified fig 4 stretch(support for R knee)      Knee/Hip Exercises: Standing   Heel Raises 20 reps    Other Standing Knee Exercises Tandem stance,  toe taps, 1/2 Tandem stance with head turns, , Slow march for SLS; Torso turns x 10 bil;    Other Standing Knee Exercises Walking around cones , stepping up over cones 25 ft x 4 each;      Knee/Hip Exercises: Seated   Long Arc Quad 20 reps    Long Arc Quad Weight 3 lbs.    Sit to Sand 10 reps;without UE  support   mat table.     Knee/Hip Exercises: Supine   Straight Leg Raises 10 reps;2 sets;Both      Knee/Hip Exercises: Sidelying   Hip ABduction 10 reps;2 sets;Both                      PT Short Term Goals - 08/15/20 1207       PT SHORT TERM GOAL #1   Title Pt to be independent wtih initial HEP    Time 2    Period Weeks    Status New    Target Date 08/15/20               PT Long Term Goals - 08/15/20 1239       PT LONG TERM GOAL #1   Title Pt to demo improved time of 5 time Sit to stand by at least 5 sec.    Time 6    Period Weeks    Status New    Target Date 09/12/20      PT LONG TERM GOAL #2   Title Pt to demo improved score on DGI by at least 5 points .    Baseline score: 17 at eval.    Time 6    Period Weeks    Status Revised    Target Date 09/12/20      PT LONG TERM GOAL #3   Title Pt to demo increased strength of bil hips and knees to 5/5, to improve stability, stairs, and endurance.    Time 6    Period  Weeks    Status New    Target Date 09/12/20      PT LONG TERM GOAL #4   Title Pt to report ability for standing activity for at least 30 min at home, without requiring seated rest break, to improve abilty for IADLs and community activities.    Time 6    Period Weeks    Status New    Target Date 09/12/20                   Plan - 08/16/20 1036     Clinical Impression Statement . Pt challenged with progression to dynamic balance today. Improved safety and control after education and practice for stepping up over cones. Pt also challenged with  direction changes in clinic. Plan to progress stability as tolerated.    Personal Factors and Comorbidities Comorbidity 1    Comorbidities Afib, CHF, CAD, HTN    Examination-Activity Limitations Lift;Stand;Transfers;Carry;Squat;Stairs    Examination-Participation Restrictions Cleaning;Meal Prep;Yard Work;Community Activity;Shop    Stability/Clinical Decision Making Stable/Uncomplicated    Rehab Potential Good    PT Frequency 2x / week    PT Duration 6 weeks    PT Treatment/Interventions ADLs/Self Care Home Management;Cryotherapy;Moist Heat;Traction;DME Instruction;Balance training;Therapeutic exercise;Therapeutic activities;Functional mobility training;Stair training;Gait training;Neuromuscular re-education;Patient/family education;Manual techniques;Passive range of motion;Dry needling;Taping;Joint Manipulations;Spinal Manipulations;Vasopneumatic Device    Consulted and Agree with Plan of Care Patient             Patient will benefit from skilled therapeutic intervention in order to improve the following deficits and impairments:  Abnormal gait, Pain, Decreased mobility, Decreased strength, Decreased range of motion, Decreased endurance, Decreased activity tolerance, Impaired vision/preception, Decreased safety awareness  Visit Diagnosis: Other abnormalities of gait and mobility  Muscle weakness (generalized)  Chronic pain of right  knee     Problem List Patient Active Problem List   Diagnosis Date Noted   Unsteady gait  07/25/2020   CHF (congestive heart failure) ([place]) 07/25/2019   Cold sore 07/25/2019   Diverticulosis 04/18/2016   Insomnia 04/18/2016   Osteoarthritis 04/18/2016   H/O gastric ulcer    Former smoker    Gastroesophageal reflux disease    Paroxysmal atrial fibrillation with RVR ([place]) 02/21/2016   Coronary artery disease involving native coronary artery of native heart with angina pectoris ([place]) 09/19/2014   HTN (hypertension) 09/19/2014   Hyperlipidemia LDL goal <70, on Praluent 10/03/2013    Lyndee Hensen, PT, DPT 10:39 AM  08/16/20    [facility] [place], [place], [address] Phone: [phone]   Fax:  [phone]  Name: Jonathan Holden MRN: [id number] Date of Birth: 07-12-1945

## 2020-08-16 NOTE — Therapy (Signed)
[facility] [address] [place], [place], [address] Phone: [phone]   Fax:  [phone]  Physical Therapy Treatment  Patient Details  Name: Jonathan Holden MRN: [id number] Date of Birth: [birth year] Referring Provider (PT): Dimas Chyle   Encounter Date: 08/15/2020   PT End of Session - 08/16/20 1045     Visit Number 4    Number of Visits 12    Date for PT Re-Evaluation 09/12/20    Authorization Type Medicare    PT Start Time 1020    PT Stop Time 1100    PT Time Calculation (min) 40 min    Activity Tolerance Patient tolerated treatment well    Behavior During Therapy [facility] for tasks assessed/performed             Past Medical History:  Diagnosis Date   Acute blood loss anemia    Acute lower GI bleeding 03/19/2016   Arthritis    Atrial fibrillation with RVR ([place]) 02/21/2016   Coronary artery disease    Diverticulosis 04/18/2016   Endocarditis of native valve    Former smoker    H/O gastric ulcer    History of MI (myocardial infarction) 1992   Hyperlipidemia    Hypertension    Infection of intervertebral disc ([place])    Insomnia 04/18/2016   Left retinal detachment 03/18/2015   Loose body of right shoulder    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   Subjective Assessment - 08/16/20 1044     Subjective  Pt states minimal pain in R knee. Has been doing HEP. Also states vision problems in L eye, with detached retina, thinks he has some loss of peripheral vision, which may effect balance.    Currently in Pain? No/denies    Pain Score 0-No pain                               [facility] Adult PT Treatment/Exercise - 08/16/20 1040       Knee/Hip Exercises: Stretches   Piriformis Stretch 2 reps;30 seconds    Piriformis Stretch Limitations supine modified fig 4 stretch(support for R knee)      Knee/Hip Exercises: Aerobic   Recumbent Bike declines due to knee pain      Knee/Hip Exercises: Standing   Heel Raises 20 reps    Hip Flexion 20 reps;Knee bent    Forward Step Up 10 reps;Hand Hold: 1;Step Height: 6";Both    Other Standing Knee Exercises 1/2 Tandem stance with head turns 2x10 bil; Tandem stance 30 sec bil;  Toe taps on 6 in step x 20, no UE support; fwd, bwd, and lateral stepping with weight shifts x 10 each bil; dyanmic walking, with direction changes, fwd, lateral, and bwd x 3 min;      Knee/Hip Exercises:  Seated   Long Arc Quad 20 reps    Long Arc Quad Weight 3 lbs.    Sit to Sand 10 reps;without UE support   mat table, education on form     Knee/Hip Exercises: Supine   Straight Leg Raises 10 reps;2 sets;Both                    PT Education - 08/16/20 1045     Education Details HEP updated    Person(s) Educated Patient    Methods Explanation;Demonstration;Tactile cues;Verbal cues;Handout    Comprehension Verbalized understanding;Returned demonstration;Verbal cues required;Tactile cues required;Need further instruction              PT Short Term Goals - 08/15/20 1207       PT SHORT TERM GOAL #1   Title Pt to be independent wtih initial HEP    Time 2    Period Weeks    Status New    Target Date 08/15/20               PT Long Term Goals - 08/15/20 1239       PT LONG TERM GOAL #1   Title Pt to demo improved time of 5 time Sit to  stand by at least 5 sec.    Time 6    Period Weeks    Status New    Target Date 09/12/20      PT LONG TERM GOAL #2   Title Pt to demo improved score on DGI by at least 5 points .    Baseline score: 17 at eval.    Time 6    Period Weeks    Status Revised    Target Date 09/12/20      PT LONG TERM GOAL #3   Title Pt to demo increased strength of bil hips and knees to 5/5, to improve stability, stairs, and endurance.    Time 6    Period Weeks    Status New    Target Date 09/12/20      PT LONG TERM GOAL #4   Title Pt to report ability for standing activity for at least 30 min at home, without requiring seated rest break, to improve abilty for IADLs and community activities.    Time 6    Period Weeks    Status New    Target Date 09/12/20                   Plan - 08/16/20 1046     Clinical Impression Statement Pt challenged with dynamic balance today. Most deficit seen with (both planned and unplanned) direction changes. Updated HEP, discussed safe performance of balance exercises at home. Plan to progress strength and balance as tolerated.    Personal Factors and Comorbidities Comorbidity 1    Comorbidities Afib, CHF, CAD, HTN    Examination-Activity Limitations Lift;Stand;Transfers;Carry;Squat;Stairs    Examination-Participation Restrictions Cleaning;Meal Prep;Yard Work;Community Activity;Shop    Stability/Clinical Decision Making Stable/Uncomplicated    Rehab Potential Good    PT Frequency 2x / week    PT Duration 6 weeks    PT Treatment/Interventions ADLs/Self Care Home Management;Cryotherapy;Moist Heat;Traction;DME Instruction;Balance training;Therapeutic exercise;Therapeutic activities;Functional mobility training;Stair training;Gait training;Neuromuscular re-education;Patient/family education;Manual techniques;Passive range of motion;Dry needling;Taping;Joint Manipulations;Spinal Manipulations;Vasopneumatic Device    Consulted and Agree with Plan of Care Patient              Patient will benefit from skilled therapeutic intervention in order to improve the following deficits and  impairments:  Abnormal gait, Pain, Decreased mobility, Decreased strength, Decreased range of motion, Decreased endurance, Decreased activity tolerance, Impaired vision/preception, Decreased safety awareness  Visit Diagnosis: Other abnormalities of gait and mobility  Muscle weakness (generalized)  Chronic pain of right knee     Problem List Patient Active Problem List   Diagnosis Date Noted   Unsteady gait 07/25/2020   CHF (congestive heart failure) ([place]) 07/25/2019   Cold sore 07/25/2019   Diverticulosis 04/18/2016   Insomnia 04/18/2016   Osteoarthritis 04/18/2016   H/O gastric ulcer    Former smoker    Gastroesophageal reflux disease    Paroxysmal atrial fibrillation with RVR ([place]) 02/21/2016   Coronary artery disease involving native coronary artery of native heart with angina pectoris ([place]) 09/19/2014   HTN (hypertension) 09/19/2014   Hyperlipidemia LDL goal <70, on Praluent 10/03/2013    Lyndee Hensen, PT, DPT 10:48 AM  08/16/20    [facility] [place], [place], [address] Phone: [phone]   Fax:  [phone]  Name: Jonathan Holden MRN: [id number] Date of Birth: Apr 22, 1945

## 2020-08-20 ENCOUNTER — Ambulatory Visit (INDEPENDENT_AMBULATORY_CARE_PROVIDER_SITE_OTHER): Payer: Medicare Other | Admitting: Physical Therapy

## 2020-08-20 ENCOUNTER — Encounter: Payer: Self-pay | Admitting: Physical Therapy

## 2020-08-20 ENCOUNTER — Other Ambulatory Visit: Payer: Self-pay

## 2020-08-20 DIAGNOSIS — M25561 Pain in right knee: Secondary | ICD-10-CM | POA: Diagnosis not present

## 2020-08-20 DIAGNOSIS — M6281 Muscle weakness (generalized): Secondary | ICD-10-CM | POA: Diagnosis not present

## 2020-08-20 DIAGNOSIS — R2689 Other abnormalities of gait and mobility: Secondary | ICD-10-CM

## 2020-08-20 DIAGNOSIS — G8929 Other chronic pain: Secondary | ICD-10-CM

## 2020-08-20 NOTE — Therapy (Signed)
[facility] [address] [place], [place], [address] Phone: [phone]   Fax:  [phone]  Physical Therapy Treatment  Patient Details  Name: Jonathan Holden MRN: [id number] Date of Birth: 1945/09/23 Referring Provider (PT): Dimas Chyle   Encounter Date: 08/20/2020   PT End of Session - 08/20/20 1150     Visit Number 5    Number of Visits 12    Date for PT Re-Evaluation 09/12/20    Authorization Type Medicare    PT Start Time 0804    PT Stop Time 0845    PT Time Calculation (min) 41 min    Activity Tolerance Patient tolerated treatment well    Behavior During Therapy [facility] for tasks assessed/performed             Past Medical History:  Diagnosis Date   Acute blood loss anemia    Acute lower GI bleeding 03/19/2016   Arthritis    Atrial fibrillation with RVR ([place]) 02/21/2016   Coronary artery disease    Diverticulosis 04/18/2016   Endocarditis of native valve    Former smoker    H/O gastric ulcer    History of MI (myocardial infarction) 1992   Hyperlipidemia    Hypertension    Infection of intervertebral disc ([place])    Insomnia 04/18/2016   Left retinal detachment 03/18/2015   Loose body of right shoulder    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   Subjective Assessment - 08/20/20 0848     Subjective Pt  states doing well.    Currently in Pain? No/denies    Pain Score 0-No pain                               [facility] Adult PT Treatment/Exercise - 08/20/20 0001       Knee/Hip Exercises: Stretches   Piriformis Stretch --    Piriformis Stretch Limitations --      Knee/Hip Exercises: Standing   Forward Step Up 10 reps;Step Height: 6";Both;Hand Hold: 0    Other Standing Knee Exercises Toe taps on 6 in step x 20, no UE support; fwd, bwd, and lateral stepping with weight shifts x 10 each bil; dyanmic walking, with direction changes, fwd, lateral, and bwd x 2 min;  AirEx: a/p weight shifts x 30;  L/R weight shifts with lateral reaching x 20;    Other Standing Knee Exercises Marching and hip abd 2 x 10 bil;      Knee/Hip Exercises: Seated   Long Arc Quad 20 reps    Long Arc Quad Weight 3 lbs.    Sit to Sand 10 reps;without UE support   mat table, with fwd and bwd walking after standing. x 5;  x10 from reg height chair(education on protecting R knee as needed)  PT Short Term Goals - 08/15/20 1207       PT SHORT TERM GOAL #1   Title Pt to be independent wtih initial HEP    Time 2    Period Weeks    Status New    Target Date 08/15/20               PT Long Term Goals - 08/15/20 1239       PT LONG TERM GOAL #1   Title Pt to demo improved time of 5 time Sit to stand by at least 5 sec.    Time 6    Period Weeks    Status New    Target Date 09/12/20      PT LONG TERM GOAL #2   Title Pt to demo improved score on DGI by at least 5 points .    Baseline score: 17 at eval.    Time 6    Period Weeks    Status Revised    Target Date 09/12/20      PT LONG TERM GOAL #3   Title Pt to demo increased strength of bil hips and knees to 5/5, to improve stability, stairs, and endurance.    Time 6    Period Weeks    Status New    Target Date 09/12/20      PT LONG TERM GOAL #4   Title Pt to report ability for standing activity for at  least 30 min at home, without requiring seated rest break, to improve abilty for IADLs and community activities.    Time 6    Period Weeks    Status New    Target Date 09/12/20                   Plan - 08/20/20 1159     Clinical Impression Statement Pt with good ability and tolerance for activities done today. Improving ability for step ups and static balance. Challenged most with dynamic balance and direction changes. Pt to benefit from continued progression of this. Plan to work some outside on uneven ground and curbs next visit.    Personal Factors and Comorbidities Comorbidity 1    Comorbidities Afib, CHF, CAD, HTN    Examination-Activity Limitations Lift;Stand;Transfers;Carry;Squat;Stairs    Examination-Participation Restrictions Cleaning;Meal Prep;Yard Work;Community Activity;Shop    Stability/Clinical Decision Making Stable/Uncomplicated    Rehab Potential Good    PT Frequency 2x / week    PT Duration 6 weeks    PT Treatment/Interventions ADLs/Self Care Home Management;Cryotherapy;Moist Heat;Traction;DME Instruction;Balance training;Therapeutic exercise;Therapeutic activities;Functional mobility training;Stair training;Gait training;Neuromuscular re-education;Patient/family education;Manual techniques;Passive range of motion;Dry needling;Taping;Joint Manipulations;Spinal Manipulations;Vasopneumatic Device    Consulted and Agree with Plan of Care Patient             Patient will benefit from skilled therapeutic intervention in order to improve the following deficits and impairments:  Abnormal gait, Pain, Decreased mobility, Decreased strength, Decreased range of motion, Decreased endurance, Decreased activity tolerance, Impaired vision/preception, Decreased safety awareness  Visit Diagnosis: Other abnormalities of gait and mobility  Muscle weakness (generalized)  Chronic pain of right knee     Problem List Patient Active Problem List   Diagnosis Date Noted    Unsteady gait 07/25/2020   CHF (congestive heart failure) ([place]) 07/25/2019   Cold sore 07/25/2019   Diverticulosis 04/18/2016   Insomnia 04/18/2016   Osteoarthritis 04/18/2016   H/O gastric ulcer    Former smoker    Gastroesophageal reflux disease    Paroxysmal atrial  fibrillation with RVR ([place]) 02/21/2016   Coronary artery disease involving native coronary artery of native heart with angina pectoris ([place]) 09/19/2014   HTN (hypertension) 09/19/2014   Hyperlipidemia LDL goal <70, on Praluent 10/03/2013   Lyndee Hensen, PT, DPT 12:02 PM  08/20/20    [facility] [place], [place], [address] Phone: [phone]   Fax:  [phone]  Name: Jonathan Holden MRN: [id number] Date of Birth: 19-Mar-1945

## 2020-08-22 ENCOUNTER — Ambulatory Visit (INDEPENDENT_AMBULATORY_CARE_PROVIDER_SITE_OTHER): Payer: Medicare Other | Admitting: Physical Therapy

## 2020-08-22 ENCOUNTER — Other Ambulatory Visit: Payer: Self-pay

## 2020-08-22 ENCOUNTER — Encounter: Payer: Self-pay | Admitting: Physical Therapy

## 2020-08-22 DIAGNOSIS — G8929 Other chronic pain: Secondary | ICD-10-CM

## 2020-08-22 DIAGNOSIS — M25561 Pain in right knee: Secondary | ICD-10-CM | POA: Diagnosis not present

## 2020-08-22 DIAGNOSIS — R2689 Other abnormalities of gait and mobility: Secondary | ICD-10-CM | POA: Diagnosis not present

## 2020-08-22 DIAGNOSIS — M6281 Muscle weakness (generalized): Secondary | ICD-10-CM

## 2020-08-22 NOTE — Therapy (Signed)
[facility] [address] [place], [place], [address] Phone: [phone]   Fax:  [phone]  Physical Therapy Treatment  Patient Details  Name: Jonathan Holden MRN: [id number] Date of Birth: [birth year] Referring Provider (PT): Dimas Chyle   Encounter Date: 08/22/2020   PT End of Session - 08/22/20 0810     Visit Number 6    Number of Visits 12    Date for PT Re-Evaluation 09/12/20    Authorization Type Medicare    PT Start Time 0801    PT Stop Time 0840    PT Time Calculation (min) 39 min    Activity Tolerance Patient tolerated treatment well    Behavior During Therapy [facility] for tasks assessed/performed             Past Medical History:  Diagnosis Date   Acute blood loss anemia    Acute lower GI bleeding 03/19/2016   Arthritis    Atrial fibrillation with RVR ([place]) 02/21/2016   Coronary artery disease    Diverticulosis 04/18/2016   Endocarditis of native valve    Former smoker    H/O gastric ulcer    History of MI (myocardial infarction) 1992   Hyperlipidemia    Hypertension    Infection of intervertebral disc ([place])    Insomnia 04/18/2016   Left retinal detachment 03/18/2015   Loose body of right shoulder    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   Subjective Assessment - 08/22/20 0810     Subjective Pt  with no new complaints.    Currently in Pain? No/denies    Pain Score 0-No pain                [facility] PT Assessment - 08/22/20 0001       Standardized Balance Assessment   Standardized Balance Assessment Timed Up and Go Test      Timed Up and Go Test   TUG Comments 11.5 sec;                           [facility] Adult PT Treatment/Exercise - 08/22/20 0001       Knee/Hip Exercises: Standing   Forward Step Up 10 reps;Both;Hand Hold: 0    Forward Step Up Limitations AirEx    Stairs up/down 5 steps recipricol, no Hand rail, x 5    Gait Training outdoor walking on uneven grass, inclines x 400 ft;  up/down curb x 10;    Other Standing Knee Exercises Toe taps on 6 in step x 20, no UE support; fwd, bwd, and lateral stepping with weight shifts x 10 each bil;    AirEx: a/p weight shifts x 30; Side stepping 25 ft x4;  Bwd walking 25 ft x 4;  Fast fwd walking 35 ft x 4;  Other Standing Knee Exercises Marching and hip abd 2 x 10 bil;      Knee/Hip Exercises: Seated   Long Arc Quad --    Long Arc [employer] --    Sit to [employer] 10 reps;without UE support   mat table,     Knee/Hip Exercises: Supine   Other Supine Knee/Hip Exercises reviewed shoulder flexion stretch with cane x 10 for shoulder stiffness (HEP)                      PT Short Term Goals - 08/15/20 1207       PT SHORT TERM GOAL #1   Title Pt to be independent wtih initial HEP    Time 2    Period Weeks    Status New    Target Date 08/15/20               PT Long Term Goals - 08/15/20 1239       PT LONG TERM GOAL #1   Title Pt to demo improved time of 5 time Sit to stand by at least 5 sec.    Time 6    Period Weeks    Status New    Target Date 09/12/20      PT LONG TERM GOAL #2   Title Pt to demo improved score on DGI by at least 5 points .    Baseline score: 17 at eval.    Time 6    Period Weeks    Status Revised    Target Date 09/12/20      PT LONG TERM GOAL #3   Title Pt  to demo increased strength of bil hips and knees to 5/5, to improve stability, stairs, and endurance.    Time 6    Period Weeks    Status New    Target Date 09/12/20      PT LONG TERM GOAL #4   Title Pt to report ability for standing activity for at least 30 min at home, without requiring seated rest break, to improve abilty for IADLs and community activities.    Time 6    Period Weeks    Status New    Target Date 09/12/20                   Plan - 08/22/20 0856     Clinical Impression Statement Pt progressing very well. Improving confidence and ability for dynamic balance. Has been able to progress to unstable surface, and did well with outdoor walking today. Pt to benefit from continued care for starting/stopping motion, direction changes, and dyanamic balance. Will likely work towards d/c in next 2 weeks.    Personal Factors and Comorbidities Comorbidity 1    Comorbidities Afib, CHF, CAD, HTN    Examination-Activity Limitations Lift;Stand;Transfers;Carry;Squat;Stairs    Examination-Participation Restrictions Cleaning;Meal Prep;Yard Work;Community Activity;Shop    Stability/Clinical Decision Making Stable/Uncomplicated    Rehab Potential Good    PT Frequency 2x / week    PT Duration 6 weeks    PT Treatment/Interventions ADLs/Self Care Home Management;Cryotherapy;Moist Heat;Traction;DME Instruction;Balance training;Therapeutic exercise;Therapeutic activities;Functional mobility training;Stair training;Gait training;Neuromuscular re-education;Patient/family education;Manual techniques;Passive range of motion;Dry needling;Taping;Joint Manipulations;Spinal Manipulations;Vasopneumatic Device    Consulted and Agree with Plan of Care Patient             Patient will benefit from skilled therapeutic intervention in order to improve the following deficits and impairments:  Abnormal gait, Pain, Decreased mobility, Decreased strength, Decreased range of  motion, Decreased endurance,  Decreased activity tolerance, Impaired vision/preception, Decreased safety awareness  Visit Diagnosis: Other abnormalities of gait and mobility  Muscle weakness (generalized)  Chronic pain of right knee     Problem List Patient Active Problem List   Diagnosis Date Noted   Unsteady gait 07/25/2020   CHF (congestive heart failure) ([place]) 07/25/2019   Cold sore 07/25/2019   Diverticulosis 04/18/2016   Insomnia 04/18/2016   Osteoarthritis 04/18/2016   H/O gastric ulcer    Former smoker    Gastroesophageal reflux disease    Paroxysmal atrial fibrillation with RVR ([place]) 02/21/2016   Coronary artery disease involving native coronary artery of native heart with angina pectoris ([place]) 09/19/2014   HTN (hypertension) 09/19/2014   Hyperlipidemia LDL goal <70, on Praluent 10/03/2013   Lyndee Hensen, PT, DPT 8:58 AM  08/22/20    [facility] [place], [place], [address] Phone: [phone]   Fax:  [phone]  Name: MITCHEAL RABBITT MRN: [id number] Date of Birth: 1945-10-20

## 2020-08-25 ENCOUNTER — Other Ambulatory Visit (HOSPITAL_COMMUNITY): Payer: Self-pay | Admitting: Interventional Cardiology

## 2020-08-26 ENCOUNTER — Ambulatory Visit (INDEPENDENT_AMBULATORY_CARE_PROVIDER_SITE_OTHER): Payer: Medicare Other | Admitting: Physical Therapy

## 2020-08-26 ENCOUNTER — Encounter: Payer: Self-pay | Admitting: Physical Therapy

## 2020-08-26 ENCOUNTER — Other Ambulatory Visit: Payer: Self-pay

## 2020-08-26 DIAGNOSIS — M6281 Muscle weakness (generalized): Secondary | ICD-10-CM | POA: Diagnosis not present

## 2020-08-26 DIAGNOSIS — G8929 Other chronic pain: Secondary | ICD-10-CM | POA: Diagnosis not present

## 2020-08-26 DIAGNOSIS — M25561 Pain in right knee: Secondary | ICD-10-CM | POA: Diagnosis not present

## 2020-08-26 DIAGNOSIS — R2689 Other abnormalities of gait and mobility: Secondary | ICD-10-CM

## 2020-08-26 NOTE — Therapy (Signed)
[facility] [address] [place], [place], [address] Phone: [phone]   Fax:  [phone]  Physical Therapy Treatment  Patient Details  Name: Jonathan Holden MRN: [id number] Date of Birth: August 15, 1945 Referring Provider (PT): Dimas Chyle   Encounter Date: 08/26/2020   PT End of Session - 08/26/20 0852     Visit Number 7    Number of Visits 12    Date for PT Re-Evaluation 09/12/20    Authorization Type Medicare    PT Start Time 0805    PT Stop Time 0845    PT Time Calculation (min) 40 min    Activity Tolerance Patient tolerated treatment well    Behavior During Therapy [facility] for tasks assessed/performed             Past Medical History:  Diagnosis Date   Acute blood loss anemia    Acute lower GI bleeding 03/19/2016   Arthritis    Atrial fibrillation with RVR ([place]) 02/21/2016   Coronary artery disease    Diverticulosis 04/18/2016   Endocarditis of native valve    Former smoker    H/O gastric ulcer    History of MI (myocardial infarction) 1992   Hyperlipidemia    Hypertension    Infection of intervertebral disc ([place])    Insomnia 04/18/2016   Left retinal detachment 03/18/2015   Loose body of right shoulder    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   Subjective Assessment - 08/26/20 0850     Subjective Pt  had fall on saturday, was getting out of car, turned ankle on L, and fell to L, on hip. states mild increase in L hip pain, but no other injuries. Was raining and wet on ground .    Currently in Pain? Yes    Pain Score 3     Pain Location Hip    Pain Orientation Left    Pain Descriptors / Indicators Aching    Pain Type Acute pain    Pain Onset In the past 7 days    Pain Frequency Intermittent                               [facility] Adult PT Treatment/Exercise - 08/26/20 0001       Knee/Hip Exercises: Stretches   Piriformis Stretch 3 reps;30 seconds    Piriformis Stretch Limitations for L hip pain      Knee/Hip Exercises: Standing   Heel Raises 20 reps    Forward Step Up 10 reps;Both;Hand Hold: 0    Forward Step Up Limitations AirEx    Stairs up/down 5 steps recipricol, no Hand rail, x6    Gait Training --    Other Standing Knee Exercises Toe taps on 6 in step x 20, no UE support;  Corner balance: L/R and a/p weight shifts x 30;  Tandem stance 30 sec x 1 bil, tandem stance with head turns x 10 ea bil; Cone pick up from floor, practice for bending down, 25 ft x 3 with cuing for squat/bend mechanics.    Other Standing Knee Exercises Marching (slow) x 20;      Knee/Hip Exercises: Seated   Sit to Sand --      Knee/Hip Exercises: Supine   Bridges 20 reps    Other Supine Knee/Hip Exercises --                    PT Education - 08/26/20 0851     Education Details Reviewed HEP, reviewed optimal mechanics for car transfer    Person(s) Educated Patient    Methods Demonstration;Tactile cues;Verbal cues;Explanation;Handout    Comprehension Verbalized understanding;Returned demonstration;Verbal cues required;Tactile cues required;Need further instruction              PT Short Term Goals - 08/26/20 0852       PT SHORT TERM GOAL #1   Title Pt to be independent wtih initial HEP    Time 2    Period Weeks    Status Achieved    Target Date 08/15/20                PT Long Term Goals - 08/15/20 1239       PT LONG TERM GOAL #1   Title Pt to demo improved time of 5 time Sit to stand by at least 5 sec.    Time 6    Period Weeks    Status New    Target Date 09/12/20      PT LONG TERM GOAL #2   Title Pt to demo improved score on DGI by at least 5 points .    Baseline score: 17 at eval.    Time 6    Period Weeks    Status Revised    Target Date 09/12/20      PT LONG TERM GOAL #3   Title Pt to demo increased strength of bil hips and knees to 5/5, to improve stability, stairs, and endurance.    Time 6    Period Weeks    Status New    Target Date 09/12/20      PT LONG TERM GOAL #4   Title Pt to report ability for standing activity for at least 30 min at home, without requiring seated rest break, to improve abilty for IADLs and community activities.    Time 6    Period Weeks    Status New    Target Date 09/12/20                   Plan - 08/26/20 0853     Clinical Impression Statement Screened L hip post fall, pt with no bruising, goog ability for ROM. Does have tenderness to palpate L gr trochanter, did have mild pain/bursitis here at baseline, but increased soreness today. Was able to do light stretch and perform regular NMR activities  today without increased pain. Discussed icing and not over doing activity, also discussed need to see MD if pain does not improve in next couple days. Pt with mild difficutly and lob on stairs today, likely due to weakness in R knee/LE. Advised pt to continue use of hand rails at home. Pt to benefit from continued care.    Personal Factors and Comorbidities Comorbidity 1  Comorbidities Afib, CHF, CAD, HTN    Examination-Activity Limitations Lift;Stand;Transfers;Carry;Squat;Stairs    Examination-Participation Restrictions Cleaning;Meal Prep;Yard Work;Community Activity;Shop    Stability/Clinical Decision Making Stable/Uncomplicated    Rehab Potential Good    PT Frequency 2x /  week    PT Duration 6 weeks    PT Treatment/Interventions ADLs/Self Care Home Management;Cryotherapy;Moist Heat;Traction;DME Instruction;Balance training;Therapeutic exercise;Therapeutic activities;Functional mobility training;Stair training;Gait training;Neuromuscular re-education;Patient/family education;Manual techniques;Passive range of motion;Dry needling;Taping;Joint Manipulations;Spinal Manipulations;Vasopneumatic Device    Consulted and Agree with Plan of Care Patient             Patient will benefit from skilled therapeutic intervention in order to improve the following deficits and impairments:  Abnormal gait, Pain, Decreased mobility, Decreased strength, Decreased range of motion, Decreased endurance, Decreased activity tolerance, Impaired vision/preception, Decreased safety awareness  Visit Diagnosis: Other abnormalities of gait and mobility  Muscle weakness (generalized)  Chronic pain of right knee     Problem List Patient Active Problem List   Diagnosis Date Noted   Unsteady gait 07/25/2020   CHF (congestive heart failure) ([place]) 07/25/2019   Cold sore 07/25/2019   Diverticulosis 04/18/2016   Insomnia 04/18/2016   Osteoarthritis 04/18/2016   H/O gastric ulcer    Former smoker    Gastroesophageal reflux disease    Paroxysmal atrial fibrillation with RVR ([place]) 02/21/2016   Coronary artery disease involving native coronary artery of native heart with angina pectoris ([place]) 09/19/2014   HTN (hypertension) 09/19/2014   Hyperlipidemia LDL goal <70, on Praluent 10/03/2013   Lyndee Hensen, PT, DPT 8:56 AM  08/26/20    [facility] [place], [place], [address] Phone: [phone]   Fax:  [phone]  Name: Jonathan Holden MRN: [id number] Date of Birth: 1946/01/03

## 2020-08-27 ENCOUNTER — Encounter: Payer: Self-pay | Admitting: Physical Therapy

## 2020-08-27 ENCOUNTER — Ambulatory Visit (INDEPENDENT_AMBULATORY_CARE_PROVIDER_SITE_OTHER): Payer: Medicare Other | Admitting: Physical Therapy

## 2020-08-27 DIAGNOSIS — M6281 Muscle weakness (generalized): Secondary | ICD-10-CM

## 2020-08-27 DIAGNOSIS — R2689 Other abnormalities of gait and mobility: Secondary | ICD-10-CM | POA: Diagnosis not present

## 2020-08-27 DIAGNOSIS — M25561 Pain in right knee: Secondary | ICD-10-CM | POA: Diagnosis not present

## 2020-08-27 DIAGNOSIS — G8929 Other chronic pain: Secondary | ICD-10-CM | POA: Diagnosis not present

## 2020-08-27 NOTE — Therapy (Signed)
[facility] [address] [place], [place], [address] Phone: [phone]   Fax:  [phone]  Physical Therapy Treatment/Progress Note   Patient Details  Name: Jonathan Holden MRN: [id number] Date of Birth: 23-Aug-1945 Referring Provider (PT): Dimas Chyle  Physical Therapy Progress Note  Dates of Reporting Period: 08/01/20 to 08/27/20     Encounter Date: 08/27/2020   PT End of Session - 08/27/20 2140     Visit Number 8    Number of Visits 12    Date for PT Re-Evaluation 09/12/20    Authorization Type Medicare     PN done at visit 8    PT Start Time 0805    PT Stop Time 0845    PT Time Calculation (min) 40 min    Activity Tolerance Patient tolerated treatment well    Behavior During Therapy [facility] for tasks assessed/performed             Past Medical History:  Diagnosis Date   Acute blood loss anemia    Acute lower GI bleeding 03/19/2016   Arthritis    Atrial fibrillation with RVR ([place]) 02/21/2016   Coronary artery disease    Diverticulosis 04/18/2016   Endocarditis of native valve    Former smoker    H/O gastric ulcer    History of MI (myocardial infarction) 1992   Hyperlipidemia    Hypertension    Infection of intervertebral disc ([place])    Insomnia 04/18/2016   Left retinal detachment 03/18/2015   Loose body of right shoulder    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   Subjective Assessment - 08/27/20 2139     Subjective Pt states L hip pain is imroving from last visit.    Currently in Pain? Yes    Pain Score 2     Pain Location Hip    Pain Orientation Left    Pain Descriptors / Indicators Aching    Pain Type Acute pain    Pain Onset More than a month ago    Pain Frequency Intermittent                [facility] PT Assessment - 08/27/20 0001       Transfers   Five time sit to stand comments  13.7 sec  with hands, 18.41. without hands, minor knee pain      Dynamic Gait Index   Level Surface Normal    Change in Gait Speed Normal    Gait with Horizontal Head Turns Normal    Gait with Vertical Head Turns Normal    Gait and Pivot Turn Mild Impairment    Step Over Obstacle Mild Impairment    Step Around Obstacles Normal    Steps Mild Impairment    Total Score 21  [facility] Adult PT Treatment/Exercise - 08/27/20 0001       Knee/Hip Exercises: Stretches   Piriformis Stretch --    Piriformis Stretch Limitations --      Knee/Hip Exercises: Standing   Heel Raises 20 reps    Hip Abduction 20 reps;Both    Forward Step Up 10 reps;Both;Hand Hold: 0    Forward Step Up Limitations AirEx    Stairs up/down 5 steps recipricol, no Hand rail, x6    Other Standing Knee Exercises Toe taps on 6 in step x 20, no UE support;  dynamic walking (in addition to DGI) with speed changes and head turns.    Other Standing Knee Exercises Marching (slow) x 20;      Knee/Hip Exercises: Seated   Long Arc Quad 20 reps    Long Arc Quad Weight 3 lbs.      Knee/Hip Exercises: Supine   Bridges 20 reps    Other Supine Knee/Hip Exercises CLams blue TB x 20;                    PT Education - 08/27/20 2139     Education Details Reviewed HEP    Person(s) Educated Patient    Methods Explanation;Demonstration;Verbal cues    Comprehension Verbalized  understanding;Returned demonstration;Verbal cues required;Need further instruction              PT Short Term Goals - 08/26/20 0852       PT SHORT TERM GOAL #1   Title Pt to be independent wtih initial HEP    Time 2    Period Weeks    Status Achieved    Target Date 08/15/20               PT Long Term Goals - 08/27/20 2140       PT LONG TERM GOAL #1   Title Pt to demo improved time of 5 time Sit to stand by at least 5 sec.    Time 6    Period Weeks    Status Achieved      PT LONG TERM GOAL #2   Title Pt to demo improved score on DGI by at least 5 points .    Baseline score: 17 at eval.    Time 6    Period Weeks    Status Partially Met    Target Date 09/12/20      PT LONG TERM GOAL #3   Title Pt to demo increased strength of bil hips and knees to 5/5, to improve stability, stairs, and endurance.    Time 6    Period Weeks    Status Partially Met    Target Date 09/12/20      PT LONG TERM GOAL #4   Title Pt to report ability for standing activity for at least 30 min at home, without requiring seated rest break, to improve abilty for IADLs and community activities.    Time 6    Period Weeks    Status Partially Met    Target Date 09/12/20                   Plan - 08/27/20 2144     Clinical Impression Statement Pt with decreasing hip pain, still mildly sore from fall. Pt with improving ability for balance actvities, continues to be challenged with stairs (without UE support) , and direction changes with walking. Pt with much reliance on vision with stepping exercises. Pt to benefit from continued  care for dynamic balance, stability, LE strength, and to decrease risk for other falls. Likely d/c in in next 2 weeks, if pt continues to improve.    Personal Factors and Comorbidities Comorbidity 1    Comorbidities Afib, CHF, CAD, HTN    Examination-Activity Limitations Lift;Stand;Transfers;Carry;Squat;Stairs    Examination-Participation Restrictions  Cleaning;Meal Prep;Yard Work;Community Activity;Shop    Stability/Clinical Decision Making Stable/Uncomplicated    Rehab Potential Good    PT Frequency 2x / week    PT Duration 6 weeks    PT Treatment/Interventions ADLs/Self Care Home Management;Cryotherapy;Moist Heat;Traction;DME Instruction;Balance training;Therapeutic exercise;Therapeutic activities;Functional mobility training;Stair training;Gait training;Neuromuscular re-education;Patient/family education;Manual techniques;Passive range of motion;Dry needling;Taping;Joint Manipulations;Spinal Manipulations;Vasopneumatic Device    Consulted and Agree with Plan of Care Patient             Patient will benefit from skilled therapeutic intervention in order to improve the following deficits and impairments:  Abnormal gait, Pain, Decreased mobility, Decreased strength, Decreased range of motion, Decreased endurance, Decreased activity tolerance, Impaired vision/preception, Decreased safety awareness  Visit Diagnosis: Other abnormalities of gait and mobility  Muscle weakness (generalized)  Chronic pain of right knee     Problem List Patient Active Problem List   Diagnosis Date Noted   Unsteady gait 07/25/2020   CHF (congestive heart failure) ([place]) 07/25/2019   Cold sore 07/25/2019   Diverticulosis 04/18/2016   Insomnia 04/18/2016   Osteoarthritis 04/18/2016   H/O gastric ulcer    Former smoker    Gastroesophageal reflux disease    Paroxysmal atrial fibrillation with RVR ([place]) 02/21/2016   Coronary artery disease involving native coronary artery of native heart with angina pectoris ([place]) 09/19/2014   HTN (hypertension) 09/19/2014   Hyperlipidemia LDL goal <70, on Praluent 10/03/2013   Lyndee Hensen, PT, DPT 9:48 PM  08/27/20   [facility] [address] [place], [place], [address] Phone: [phone]   Fax:  [phone]  Name: Jonathan Holden MRN: [id number] Date of  Birth: [birth year]

## 2020-08-31 ENCOUNTER — Other Ambulatory Visit: Payer: Self-pay | Admitting: Interventional Cardiology

## 2020-09-03 ENCOUNTER — Encounter: Payer: Medicare Other | Admitting: Physical Therapy

## 2020-09-05 ENCOUNTER — Ambulatory Visit (INDEPENDENT_AMBULATORY_CARE_PROVIDER_SITE_OTHER): Payer: Medicare Other | Admitting: Physical Therapy

## 2020-09-05 ENCOUNTER — Encounter: Payer: Self-pay | Admitting: Physical Therapy

## 2020-09-05 ENCOUNTER — Other Ambulatory Visit: Payer: Self-pay

## 2020-09-05 DIAGNOSIS — M6281 Muscle weakness (generalized): Secondary | ICD-10-CM | POA: Diagnosis not present

## 2020-09-05 DIAGNOSIS — R2689 Other abnormalities of gait and mobility: Secondary | ICD-10-CM

## 2020-09-05 NOTE — Therapy (Signed)
[facility] [address] [place], [place], [address] Phone: [phone]   Fax:  [phone]  Physical Therapy Treatment  Patient Details  Name: Jonathan Holden MRN: [id number] Date of Birth: 07-09-1945 Referring Provider (PT): Dimas Chyle   Encounter Date: 09/05/2020   PT End of Session - 09/05/20 0801     Visit Number 9    Number of Visits 12    Date for PT Re-Evaluation 09/12/20    Authorization Type Medicare     PN done at visit 8    PT Start Time 0801    PT Stop Time 0845    PT Time Calculation (min) 44 min    Activity Tolerance Patient tolerated treatment well    Behavior During Therapy [facility] for tasks assessed/performed             Past Medical History:  Diagnosis Date   Acute blood loss anemia    Acute lower GI bleeding 03/19/2016   Arthritis    Atrial fibrillation with RVR ([place]) 02/21/2016   Coronary artery disease    Diverticulosis 04/18/2016   Endocarditis of native valve    Former smoker    H/O gastric ulcer    History of MI (myocardial infarction) 1992   Hyperlipidemia    Hypertension    Infection of intervertebral disc ([place])    Insomnia 04/18/2016   Left retinal detachment 03/18/2015   Loose body of right shoulder    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   Subjective Assessment - 09/05/20  0801     Subjective No new complaints.    Pertinent History A-fib, CAD, HTN, CHF    Limitations Lifting;Standing;Walking;House hold activities    Patient Stated Goals improved LE strength, increase ability for exercise, improve balance    Currently in Pain? No/denies                               [facility] Adult PT Treatment/Exercise - 09/05/20 0001       Knee/Hip Exercises: Standing   Heel Raises 20 reps    Hip Abduction 20 reps;Both    Lateral Step Up Both;10 reps;Hand Hold: 0    Lateral Step Up Limitations Air Ex    Forward Step Up Both;Hand Hold: 0;20 reps   2nd set of 10 holding 9# wt   Forward Step Up Limitations AirEx    Lunge Walking - Round Trips monster walk with BTB x 20; also side stepping BTB 2x15    Stairs up/down 5 steps recipricol, no Hand rail, x6    SLS Right multiple reps    SLS with Vectors Left x 10    Other Standing Knee Exercises Toe taps on 6 in step x 20, no UE support;  dynamic walking with head turns; walking over obstacles.  Other Standing Knee Exercises Marching (slow) x 20; modified tandem walking x 2 laps of clinic;      Knee/Hip Exercises: Seated   Long Arc Quad 20 reps    Long Arc Quad Weight 5 lbs.      Knee/Hip Exercises: Supine   Bridges 20 reps    Bridges Limitations with BTB      Knee/Hip Exercises: Sidelying   Clams BTB x 15 bil                      PT Short Term Goals - 08/26/20 0852       PT SHORT TERM GOAL #1   Title Pt to be independent wtih initial HEP    Time 2    Period Weeks    Status Achieved    Target Date 08/15/20               PT Long Term Goals - 08/27/20 2140       PT LONG TERM GOAL #1   Title Pt to demo improved time of 5 time Sit to stand by at least 5 sec.    Time 6    Period Weeks    Status Achieved      PT LONG TERM GOAL #2   Title Pt to demo improved score on DGI by at least 5 points .    Baseline score: 17 at eval.    Time 6    Period Weeks    Status  Partially Met    Target Date 09/12/20      PT LONG TERM GOAL #3   Title Pt to demo increased strength of bil hips and knees to 5/5, to improve stability, stairs, and endurance.    Time 6    Period Weeks    Status Partially Met    Target Date 09/12/20      PT LONG TERM GOAL #4   Title Pt to report ability for standing activity for at least 30 min at home, without requiring seated rest break, to improve abilty for IADLs and community activities.    Time 6    Period Weeks    Status Partially Met    Target Date 09/12/20                   Plan - 09/05/20 0845     Clinical Impression Statement Patient did very well today with increased balance challenges. SLS is challenging on his Rt LE. He did well with vectors on the Lt LE. He was able to step over obstacles without slowing gait speed or LOB. Still has some deviation with right head turns during gait. Excellent job with modified tandem walking using floor plank as guideline.    Comorbidities Afib, CHF, CAD, HTN    PT Frequency 2x / week    PT Duration 6 weeks    PT Treatment/Interventions ADLs/Self Care Home Management;Cryotherapy;Moist Heat;Traction;DME Instruction;Balance training;Therapeutic exercise;Therapeutic activities;Functional mobility training;Stair training;Gait training;Neuromuscular re-education;Patient/family education;Manual techniques;Passive range of motion;Dry needling;Taping;Joint Manipulations;Spinal Manipulations;Vasopneumatic Device    PT Next Visit Plan continue to progress with higher level balance/core    Consulted and Agree with Plan of Care Patient             Patient will benefit from skilled therapeutic intervention in order to improve the following deficits and impairments:  Abnormal gait, Pain, Decreased mobility, Decreased strength, Decreased range of motion, Decreased endurance, Decreased activity tolerance, Impaired vision/preception, Decreased safety awareness  Visit Diagnosis:  Other  abnormalities of gait and mobility  Muscle weakness (generalized)     Problem List Patient Active Problem List   Diagnosis Date Noted   Unsteady gait 07/25/2020   CHF (congestive heart failure) ([place]) 07/25/2019   Cold sore 07/25/2019   Diverticulosis 04/18/2016   Insomnia 04/18/2016   Osteoarthritis 04/18/2016   H/O gastric ulcer    Former smoker    Gastroesophageal reflux disease    Paroxysmal atrial fibrillation with RVR ([place]) 02/21/2016   Coronary artery disease involving native coronary artery of native heart with angina pectoris ([place]) 09/19/2014   HTN (hypertension) 09/19/2014   Hyperlipidemia LDL goal <70, on Praluent 10/03/2013    Madelyn Flavors PT 09/05/2020, 9:54 AM  [facility] [address] [place], [place], [address] Phone: [phone]   Fax:  [phone]  Name: Jonathan Holden MRN: [id number] Date of Birth: Jun 13, 1945

## 2020-09-10 ENCOUNTER — Encounter: Payer: Self-pay | Admitting: Physical Therapy

## 2020-09-10 ENCOUNTER — Ambulatory Visit (INDEPENDENT_AMBULATORY_CARE_PROVIDER_SITE_OTHER): Payer: Medicare Other | Admitting: Physical Therapy

## 2020-09-10 ENCOUNTER — Other Ambulatory Visit: Payer: Self-pay

## 2020-09-10 DIAGNOSIS — R2689 Other abnormalities of gait and mobility: Secondary | ICD-10-CM

## 2020-09-10 DIAGNOSIS — M25561 Pain in right knee: Secondary | ICD-10-CM | POA: Diagnosis not present

## 2020-09-10 DIAGNOSIS — M6281 Muscle weakness (generalized): Secondary | ICD-10-CM | POA: Diagnosis not present

## 2020-09-10 DIAGNOSIS — G8929 Other chronic pain: Secondary | ICD-10-CM

## 2020-09-10 NOTE — Therapy (Signed)
[facility] [address] [place], [place], [address] Phone: [phone]   Fax:  [phone]  Physical Therapy Treatment/Discharge   Patient Details  Name: Jonathan Holden MRN: [id number] Date of Birth: 04/25/1945 Referring Provider (PT): Dimas Chyle   Encounter Date: 09/10/2020   PT End of Session - 09/10/20 0856     Visit Number 10    Number of Visits 12    Date for PT Re-Evaluation 09/12/20    Authorization Type Medicare     PN done at visit 8    PT Start Time 0803    PT Stop Time 0842    PT Time Calculation (min) 39 min    Activity Tolerance Patient tolerated treatment well    Behavior During Therapy [facility] for tasks assessed/performed             Past Medical History:  Diagnosis Date   Acute blood loss anemia    Acute lower GI bleeding 03/19/2016   Arthritis    Atrial fibrillation with RVR ([place]) 02/21/2016   Coronary artery disease    Diverticulosis 04/18/2016   Endocarditis of native valve    Former smoker    H/O gastric ulcer    History of MI (myocardial infarction) 1992   Hyperlipidemia    Hypertension    Infection of intervertebral disc ([place])    Insomnia 04/18/2016   Left retinal detachment 03/18/2015   Loose body of right shoulder    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       [facility] PT  Assessment - 09/10/20 0001       Transfers   Five time sit to stand comments  19.6 without hands(pain in R knee) ,  11.8 with light UE support      Dynamic Gait Index   Level Surface Normal    Change in Gait Speed Normal    Gait with Horizontal Head Turns Normal    Gait with Vertical Head Turns Normal    Gait and Pivot Turn Mild Impairment    Step Over Obstacle Normal    Step Around Obstacles Normal    Steps Mild Impairment    Total Score 22                           [facility] Adult PT Treatment/Exercise - 09/10/20 0001       Knee/Hip Exercises: Stretches   Piriformis Stretch Limitations reviewed for HEP      Knee/Hip Exercises: Standing   Heel Raises 20 reps    Hip Abduction --    Lateral Step Up Both;10 reps;Hand Hold: 0    Lateral Step Up Limitations Air Ex    Forward Step Up Both;Hand Hold: 0;20 reps   2nd set of 10 holding 9# wt   Forward Step Up Limitations AirEx  Lunge Walking - Round Trips --    Stairs up/down 5 steps recipricol, no Hand rail, x6    SLS 10 sec x 5 bil;    SLS with Vectors --    Other Standing Knee Exercises Toe taps on 6 in step x 20, no UE support;    Other Standing Knee Exercises Marching (slow) x 20;  tandem walking x 2 laps of clinic;      Knee/Hip Exercises: Supine   Other Supine Knee/Hip Exercises CLams blue TB x 20;      Knee/Hip Exercises: Sidelying   Hip ABduction 15 reps;Left    Clams x 15 bil                    PT Education - 09/10/20 0829     Education Details Final HEP reviewed.    Person(s) Educated Patient    Methods Explanation;Demonstration;Verbal cues;Handout    Comprehension Returned demonstration;Verbalized understanding;Verbal cues required;Need further instruction              PT Short Term Goals - 08/26/20 0852       PT SHORT TERM GOAL #1   Title Pt to be independent wtih initial HEP    Time 2    Period Weeks    Status Achieved    Target Date 08/15/20               PT  Long Term Goals - 09/10/20 0539       PT LONG TERM GOAL #1   Title Pt to demo improved time of 5 time Sit to stand by at least 5 sec.    Time 6    Period Weeks    Status Achieved      PT LONG TERM GOAL #2   Title Pt to demo improved score on DGI by at least 5 points .    Baseline achieved with light UE support.    Time 6    Period Weeks    Status Achieved      PT LONG TERM GOAL #3   Title Pt to demo increased strength of bil hips and knees to 5/5, to improve stability, stairs, and endurance.    Time 6    Period Weeks    Status Achieved      PT LONG TERM GOAL #4   Title Pt to report ability for standing activity for at least 30 min at home, without requiring seated rest break, to improve abilty for IADLs and community activities.    Time 6    Period Weeks    Status Achieved                   Plan - 09/10/20 7673     Clinical Impression Statement Pt has made very good progress and is doing very well with higher level balance. He has met goals at this time. He has much improved strength, stability, and is doing well with HEP. He has plans to work with trainer at [employer], and is ready for this transition. He does have mild weakness and pain in R knee and L hip from OA, but doing well with HEP for this. Pt ready for d/c at this time, pt in agreement with plan.    Comorbidities Afib, CHF, CAD, HTN    PT Frequency 2x / week    PT Duration 6 weeks    PT Treatment/Interventions ADLs/Self Care Home Management;Cryotherapy;Moist Heat;Traction;DME Instruction;Balance training;Therapeutic exercise;Therapeutic activities;Functional mobility training;Stair training;Gait  training;Neuromuscular re-education;Patient/family education;Manual techniques;Passive range of motion;Dry needling;Taping;Joint Manipulations;Spinal Manipulations;Vasopneumatic Device    PT Next Visit Plan continue to progress with higher level balance/core    Consulted and Agree with Plan of Care Patient              Patient will benefit from skilled therapeutic intervention in order to improve the following deficits and impairments:  Abnormal gait, Pain, Decreased mobility, Decreased strength, Decreased range of motion, Decreased endurance, Decreased activity tolerance, Impaired vision/preception, Decreased safety awareness  Visit Diagnosis: Other abnormalities of gait and mobility  Muscle weakness (generalized)  Chronic pain of right knee     Problem List Patient Active Problem List   Diagnosis Date Noted   Unsteady gait 07/25/2020   CHF (congestive heart failure) ([place]) 07/25/2019   Cold sore 07/25/2019   Diverticulosis 04/18/2016   Insomnia 04/18/2016   Osteoarthritis 04/18/2016   H/O gastric ulcer    Former smoker    Gastroesophageal reflux disease    Paroxysmal atrial fibrillation with RVR ([place]) 02/21/2016   Coronary artery disease involving native coronary artery of native heart with angina pectoris ([place]) 09/19/2014   HTN (hypertension) 09/19/2014   Hyperlipidemia LDL goal <70, on Praluent 10/03/2013   Lyndee Hensen, PT, DPT 9:06 AM  09/10/20    [facility] [place], [place], [address] Phone: [phone]   Fax:  [phone]  Name: Jonathan Holden MRN: [id number] Date of Birth: 12-30-1945 PHYSICAL THERAPY DISCHARGE SUMMARY  Visits from Start of Care: 10  Plan: Patient agrees to discharge.  Patient goals were met. Patient is being discharged due to meeting the stated rehab goals.     Lyndee Hensen, PT, DPT 9:07 AM  09/10/20

## 2020-09-10 NOTE — Patient Instructions (Signed)
Access Code: [email or web address] URL: [email or web address] Date: 09/10/2020 Prepared by: Lyndee Hensen  Exercises Seated Knee Extension AROM - 1 x daily - 2 sets - 10 reps Heel Raises with Unilateral Counter Support - 1 x daily - 2 sets - 10 reps Standing Marching - 1 x daily - 2 sets - 10 reps Straight Leg Raise - 1 x daily - 1-2 sets - 10 reps Supine Quadricep Sets - 1 x daily - 2 sets - 10 reps Sidelying Hip Abduction - 2 x daily - 2 sets - 10 reps Hooklying Clamshell with Resistance - 1 x daily - 2 sets - 10 reps Supine Piriformis Stretch Pulling Heel to Hip - 2 x daily - 3 reps - 30 hold Standing Tandem Balance with Counter Support - 2 x daily - 3 reps - 30 hold Standing Balance in Corner - 1 x daily - 2 sets - 10 reps Standing Forward Toe Taps on Box (BKA) - 1 x daily - 1 sets - 10 reps

## 2020-09-12 ENCOUNTER — Encounter: Payer: Medicare Other | Admitting: Physical Therapy

## 2020-09-17 ENCOUNTER — Encounter: Payer: Medicare Other | Admitting: Physical Therapy

## 2020-09-19 ENCOUNTER — Encounter: Payer: Medicare Other | Admitting: Physical Therapy

## 2020-09-27 ENCOUNTER — Other Ambulatory Visit: Payer: Self-pay

## 2020-09-27 ENCOUNTER — Ambulatory Visit (INDEPENDENT_AMBULATORY_CARE_PROVIDER_SITE_OTHER): Payer: Medicare Other

## 2020-09-27 VITALS — BP 118/62 | HR 81 | Temp 97.8°F | Wt 215.2 lb

## 2020-09-27 DIAGNOSIS — Z0000 Encounter for general adult medical examination without abnormal findings: Secondary | ICD-10-CM

## 2020-09-27 NOTE — Patient Instructions (Signed)
Jonathan Holden , Thank you for taking time to come for your Medicare Wellness Visit. I appreciate your ongoing commitment to your health goals. Please review the following plan we discussed and let me know if I can assist you in the future.   Screening recommendations/referrals: Colonoscopy: No longer required  Recommended yearly ophthalmology/optometry visit for glaucoma screening and checkup Recommended yearly dental visit for hygiene and checkup  Vaccinations: Influenza vaccine: Due Pneumococcal vaccine: Due and discussed Tdap vaccine: Due  Shingles vaccine: Shingrix discussed. Please contact your pharmacy for coverage information.    Covid-19: Completed 2/8, 3/5, & 07/06/20  Advanced directives: Advance directive discussed with you today. Even though you declined this today please call our office should you change your mind and we can give you the proper paperwork for you to fill out.  Conditions/risks identified: Get a little muscle tone and decrease belly weight   Next appointment: Follow up in one year for your annual wellness visit.   Preventive Care 76 Years and Older, Male Preventive care refers to lifestyle choices and visits with your health care provider that can promote health and wellness. What does preventive care include? A yearly physical exam. This is also called an annual well check. Dental exams once or twice a year. Routine eye exams. Ask your health care provider how often you should have your eyes checked. Personal lifestyle choices, including: Daily care of your teeth and gums. Regular physical activity. Eating a healthy diet. Avoiding tobacco and drug use. Limiting alcohol use. Practicing safe sex. Taking low doses of aspirin every day. Taking vitamin and mineral supplements as recommended by your health care provider. What happens during an annual well check? The services and screenings done by your health care provider during your annual well check will  depend on your age, overall health, lifestyle risk factors, and family history of disease. Counseling  Your health care provider may ask you questions about your: Alcohol use. Tobacco use. Drug use. Emotional well-being. Home and relationship well-being. Sexual activity. Eating habits. History of falls. Memory and ability to understand (cognition). Work and work Statistician. Screening  You may have the following tests or measurements: Height, weight, and BMI. Blood pressure. Lipid and cholesterol levels. These may be checked every 5 years, or more frequently if you are over 41 years old. Skin check. Lung cancer screening. You may have this screening every year starting at age 21 if you have a 30-pack-year history of smoking and currently smoke or have quit within the past 15 years. Fecal occult blood test (FOBT) of the stool. You may have this test every year starting at age 39. Flexible sigmoidoscopy or colonoscopy. You may have a sigmoidoscopy every 5 years or a colonoscopy every 10 years starting at age 70. Prostate cancer screening. Recommendations will vary depending on your family history and other risks. Hepatitis C blood test. Hepatitis B blood test. Sexually transmitted disease (STD) testing. Diabetes screening. This is done by checking your blood sugar (glucose) after you have not eaten for a while (fasting). You may have this done every 1-3 years. Abdominal aortic aneurysm (AAA) screening. You may need this if you are a current or former smoker. Osteoporosis. You may be screened starting at age 51 if you are at high risk. Talk with your health care provider about your test results, treatment options, and if necessary, the need for more tests. Vaccines  Your health care provider may recommend certain vaccines, such as: Influenza vaccine. This is recommended every year.  Tetanus, diphtheria, and acellular pertussis (Tdap, Td) vaccine. You may need a Td booster every 10  years. Zoster vaccine. You may need this after age 58. Pneumococcal 13-valent conjugate (PCV13) vaccine. One dose is recommended after age 23. Pneumococcal polysaccharide (PPSV23) vaccine. One dose is recommended after age 25. Talk to your health care provider about which screenings and vaccines you need and how often you need them. This information is not intended to replace advice given to you by your health care provider. Make sure you discuss any questions you have with your health care provider. Document Released: 02/01/2015 Document Revised: 09/25/2015 Document Reviewed: 11/06/2014 Elsevier Interactive Patient Education  2017 [place] Prevention in the Home Falls can cause injuries. They can happen to people of all ages. There are many things you can do to make your home safe and to help prevent falls. What can I do on the outside of my home? Regularly fix the edges of walkways and driveways and fix any cracks. Remove anything that might make you trip as you walk through a door, such as a raised step or threshold. Trim any bushes or trees on the path to your home. Use bright outdoor lighting. Clear any walking paths of anything that might make someone trip, such as rocks or tools. Regularly check to see if handrails are loose or broken. Make sure that both sides of any steps have handrails. Any raised decks and porches should have guardrails on the edges. Have any leaves, snow, or ice cleared regularly. Use sand or salt on walking paths during winter. Clean up any spills in your garage right away. This includes oil or grease spills. What can I do in the bathroom? Use night lights. Install grab bars by the toilet and in the tub and shower. Do not use towel bars as grab bars. Use non-skid mats or decals in the tub or shower. If you need to sit down in the shower, use a plastic, non-slip stool. Keep the floor dry. Clean up any water that spills on the floor as soon as it  happens. Remove soap buildup in the tub or shower regularly. Attach bath mats securely with double-sided non-slip rug tape. Do not have throw rugs and other things on the floor that can make you trip. What can I do in the bedroom? Use night lights. Make sure that you have a light by your bed that is easy to reach. Do not use any sheets or blankets that are too big for your bed. They should not hang down onto the floor. Have a firm chair that has side arms. You can use this for support while you get dressed. Do not have throw rugs and other things on the floor that can make you trip. What can I do in the kitchen? Clean up any spills right away. Avoid walking on wet floors. Keep items that you use a lot in easy-to-reach places. If you need to reach something above you, use a strong step stool that has a grab bar. Keep electrical cords out of the way. Do not use floor polish or wax that makes floors slippery. If you must use wax, use non-skid floor wax. Do not have throw rugs and other things on the floor that can make you trip. What can I do with my stairs? Do not leave any items on the stairs. Make sure that there are handrails on both sides of the stairs and use them. Fix handrails that are broken or loose.  Make sure that handrails are as long as the stairways. Check any carpeting to make sure that it is firmly attached to the stairs. Fix any carpet that is loose or worn. Avoid having throw rugs at the top or bottom of the stairs. If you do have throw rugs, attach them to the floor with carpet tape. Make sure that you have a light switch at the top of the stairs and the bottom of the stairs. If you do not have them, ask someone to add them for you. What else can I do to help prevent falls? Wear shoes that: Do not have high heels. Have rubber bottoms. Are comfortable and fit you well. Are closed at the toe. Do not wear sandals. If you use a stepladder: Make sure that it is fully opened.  Do not climb a closed stepladder. Make sure that both sides of the stepladder are locked into place. Ask someone to hold it for you, if possible. Clearly mark and make sure that you can see: Any grab bars or handrails. First and last steps. Where the edge of each step is. Use tools that help you move around (mobility aids) if they are needed. These include: Canes. Walkers. Scooters. Crutches. Turn on the lights when you go into a dark area. Replace any light bulbs as soon as they burn out. Set up your furniture so you have a clear path. Avoid moving your furniture around. If any of your floors are uneven, fix them. If there are any pets around you, be aware of where they are. Review your medicines with your doctor. Some medicines can make you feel dizzy. This can increase your chance of falling. Ask your doctor what other things that you can do to help prevent falls. This information is not intended to replace advice given to you by your health care provider. Make sure you discuss any questions you have with your health care provider. Document Released: 11/01/2008 Document Revised: 06/13/2015 Document Reviewed: 02/09/2014 Elsevier Interactive Patient Education  2017 [employer].

## 2020-09-27 NOTE — Progress Notes (Signed)
Subjective:   Jonathan Holden is a 75 y.o. male who presents for Medicare Annual/Subsequent preventive examination.  Review of Systems     Cardiac Risk Factors include: advanced age (>63mn, >>27women);hypertension;dyslipidemia;male gender     Objective:    Today's Vitals   09/27/20 0804  BP: 118/62  Pulse: 81  Temp: 97.8 F (36.6 C)  SpO2: 96%  Weight: 215 lb 3.2 oz (97.6 kg)   Body mass index is 27.63 kg/m.  Advanced Directives 09/27/2020 08/15/2020 09/21/2019 06/08/2017 12/16/2016 04/20/2016 03/17/2016  Does Patient Have a Medical Advance Directive? No No Yes Yes No Yes Yes  Type of Advance Directive - -Public librarianLiving will HJunturaLiving will - HT[place]uronesLiving will Living will;Healthcare Power of Attorney  Does patient want to make changes to medical advance directive? - - - No - Patient declined - - Yes (ED - Information included in AVS)  Copy of HR[place]in Chart? - - No - copy requested No - copy requested - No - copy requested Yes  Would patient like information on creating a medical advance directive? No - Patient declined No - Patient declined - - - - No - Patient declined  Pre-existing out of facility DNR order (yellow form or pink MOST form) - - - - - - -    Current Medications (verified) Outpatient Encounter Medications as of 09/27/2020  Medication Sig   Alirocumab (PRALUENT) 150 MG/ML SOAJ Inject 1 pen into the skin every 14 (fourteen) days.   amiodarone (PACERONE) 200 MG tablet Take 1 tablet (200 mg total) by mouth daily. Please make an appt with Dr STamala Julianfor future refills. Thank you. 1st attempt   clobetasol ointment (TEMOVATE) 0AB-123456789% Apply 1 application topically 2 (two) times daily.   ketoconazole (NIZORAL) 2 % shampoo APPLY 1 3 TIMES A WEEK TO SCALP. LET SIT 10 MIN THEN RINSE OUT.   lisinopril (ZESTRIL) 10 MG tablet TAKE 1 TABLET BY MOUTH DAILY. PLEASE MAKE YEARLY APPT WITH DR. STamala Julian FOR JULY   metoprolol succinate (TOPROL-XL) 25 MG 24 hr tablet TAKE 1 TABLET BY MOUTH DAILY. PLEASE MAKE YEARLY APPT WITH DR. STamala JulianFOR JULY   Polyvinyl Alcohol (LIQUID TEARS OP) Place 1 drop into both eyes daily as needed (DRY EYE).   valACYclovir (VALTREX) 1000 MG tablet TAKE 2 TABLETS BY MOUTH TWICE A DAY AS NEEDED FOR FLARES   No facility-administered encounter medications on file as of 09/27/2020.    Allergies (verified) Eliquis [apixaban], Food, Glutamic acid, Grass pollen(k-o-r-t-swt vern), Ciprofloxacin, and Pollen extract   History: Past Medical History:  Diagnosis Date   Acute blood loss anemia    Acute lower GI bleeding 03/19/2016   Arthritis    Atrial fibrillation with RVR ([place] 02/21/2016   Coronary artery disease    Diverticulosis 04/18/2016   Endocarditis of native valve    Former smoker    H/O gastric ulcer    History of MI (myocardial infarction) 1992   Hyperlipidemia    Hypertension    Infection of intervertebral disc ([place]    Insomnia 04/18/2016   Left retinal detachment 03/18/2015   Loose body of right shoulder    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   Pseudophakia of left eye 05/22/2013   Pseudophakia of right eye 06/05/2013   S/P coronary angioplasty    Streptococcal bacteremia    Past Surgical History:  Procedure Laterality Date   CARDIOVASCULAR STRESS TEST  08/22/2008  CORONARY ANGIOPLASTY  1992   ERCP W/ SPHICTEROTOMY  12/15/2002   LAPAROSCOPIC CHOLECYSTECTOMY  12/16/2002   LUMBAR LAMINECTOMY     L5   TEE WITHOUT CARDIOVERSION N/A 02/28/2016   Procedure: TRANSESOPHAGEAL ECHOCARDIOGRAM (TEE);  Surgeon: Fay Records, MD;  Location: [facility] ENDOSCOPY;  Service: Cardiovascular;  Laterality: N/A;   Family History  Problem Relation Age of Onset   Other Mother        AGE 4 HEALTHY   Heart attack Father 48       2 MI   Heart disease Maternal Grandfather    Heart disease Paternal Grandfather    Emphysema Other    Other Brother     Social History   Socioeconomic History   Marital status: Divorced    Spouse name: Not on file   Number of children: Not on file   Years of education: Not on file   Highest education level: Not on file  Occupational History   Occupation: sales  Tobacco Use   Smoking status: Former    Packs/day: 2.00    Years: 20.00    Pack years: 40.00    Types: Cigarettes    Quit date: 03/27/1990    Years since quitting: 30.5   Smokeless tobacco: Never  Vaping Use   Vaping Use: Never used  Substance and Sexual Activity   Alcohol use: Yes    Comment: Quit drinking this year    Drug use: No   Sexual activity: Not Currently  Other Topics Concern   Not on file  Social History Narrative   Not on file   Social Determinants of Health   Financial Resource Strain: Low Risk    Difficulty of Paying Living Expenses: Not hard at all  Food Insecurity: No Food Insecurity   Worried About Charity fundraiser in the Last Year: Never true   [place] in the Last Year: Never true  Transportation Needs: No Transportation Needs   Lack of Transportation (Medical): No   Lack of Transportation (Non-Medical): No  Physical Activity: Insufficiently Active   Days of Exercise per Week: 3 days   Minutes of Exercise per Session: 30 min  Stress: No Stress Concern Present   Feeling of Stress : Not at all  Social Connections: Socially Isolated   Frequency of Communication with Friends and Family: More than three times a week   Frequency of Social Gatherings with Friends and Family: More than three times a week   Attends Religious Services: Never   Marine scientist or Organizations: No   Attends Music therapist: Never   Marital Status: Divorced    Tobacco Counseling Counseling given: Not Answered   Clinical Intake:  Pre-visit preparation completed: Yes        BMI - recorded: 27.63 Nutritional Status: BMI 25 -29 Overweight Nutritional Risks: None Diabetes: No  How often  do you need to have someone help you when you read instructions, pamphlets, or other written materials from your doctor or pharmacy?: 1 - Never  Diabetic?No  Interpreter Needed?: No  Information entered by :: Charlott Rakes, LPN   Activities of Daily Living In your present state of health, do you have any difficulty performing the following activities: 09/27/2020  Hearing? N  Vision? N  Difficulty concentrating or making decisions? N  Walking or climbing stairs? N  Dressing or bathing? N  Doing errands, shopping? N  Preparing Food and eating ? N  Using the Toilet? N  In the past six months, have you accidently leaked urine? N  Do you have problems with loss of bowel control? N  Managing your Medications? N  Managing your Finances? N  Housekeeping or managing your Housekeeping? N  Some recent data might be hidden    Patient Care Team: Vivi Barrack, MD as PCP - General (Family Medicine) Belva Crome, MD as PCP - Cardiology (Cardiology) System, Provider Not In (Oral Surgery) Ashok Pall, MD as Consulting Physician (Neurosurgery) System, Provider Not In (Dentistry) Carlyle Basques, MD as Consulting Physician (Infectious Diseases) Latanya Maudlin, MD as Consulting Physician (Orthopedic Surgery) Merleen Nicely, MD as Referring Physician (Dermatology)  Indicate any recent Medical Services you may have received from other than [facility] providers in the past year (date may be approximate).     Assessment:   This is a routine wellness examination for [place].  Hearing/Vision screen Hearing Screening - Comments:: Pt denies any hearing issues  Vision Screening - Comments:: Pt follows up with [facility] Dr Eliseo Squires for annual eye exams   Dietary issues and exercise activities discussed: Current Exercise Habits: Home exercise routine, Type of exercise: walking;Other - see comments, Time (Minutes): 30, Frequency (Times/Week): 3, Weekly Exercise (Minutes/Week): 90   Goals Addressed              This Visit's Progress    Patient Stated       Get a little muscle tone and decrease belly weight        Depression Screen PHQ 2/9 Scores 09/27/2020 07/25/2020 09/21/2019 07/25/2019 12/10/2017 06/08/2017 06/23/2016  PHQ - 2 Score 0 0 0 0 0 0 0  PHQ- 9 Score - - - - 0 0 -    Fall Risk Fall Risk  09/27/2020 07/25/2020 09/21/2019 07/25/2019 06/08/2017  Falls in the past year? 1 1 0 0 Yes  Number falls in past yr: 1 0 0 - 1  Injury with Fall? 0 1 0 - No  Risk Factor Category  - - - - -  Risk for fall due to : Impaired balance/gait No Fall Risks Impaired balance/gait - History of fall(s);Impaired balance/gait  Risk for fall due to: Comment getting better - - - -  Follow up Falls prevention discussed - Falls prevention discussed - Education provided    FALL RISK PREVENTION PERTAINING TO THE HOME:  Any stairs in or around the home? Yes  If so, are there any without handrails? Yes  Home free of loose throw rugs in walkways, pet beds, electrical cords, etc? Yes  Adequate lighting in your home to reduce risk of falls? Yes   ASSISTIVE DEVICES UTILIZED TO PREVENT FALLS:  Life alert? No  Use of a cane, walker or w/c? No  Grab bars in the bathroom? No  Shower chair or bench in shower? Yes  Elevated toilet seat or a handicapped toilet? No   TIMED UP AND GO:  Was the test performed? Yes .  Length of time to ambulate 10 feet: 10 sec.   Gait steady and fast without use of assistive device  Cognitive Function:     6CIT Screen 09/27/2020 09/21/2019  What Year? 0 points 0 points  What month? 0 points 0 points  What time? 0 points -  Count back from 20 0 points 0 points  Months in reverse 0 points 0 points  Repeat phrase 0 points 0 points  Total Score 0 -    Immunizations Immunization History  Administered Date(s) Administered   Fluad Quad(high Dose  65+) 10/26/2018   Influenza, High Dose Seasonal PF 12/31/2016, 12/10/2017   Influenza-Unspecified 11/20/2015   Moderna Sars-Covid-2  Vaccination 07/06/2020   PFIZER(Purple Top)SARS-COV-2 Vaccination 02/27/2019, 03/24/2019    TDAP status: Due, Education has been provided regarding the importance of this vaccine. Advised may receive this vaccine at local pharmacy or Health Dept. Aware to provide a copy of the vaccination record if obtained from local pharmacy or Health Dept. Verbalized acceptance and understanding.  Flu Vaccine status: Due, Education has been provided regarding the importance of this vaccine. Advised may receive this vaccine at local pharmacy or Health Dept. Aware to provide a copy of the vaccination record if obtained from local pharmacy or Health Dept. Verbalized acceptance and understanding.  Pneumococcal vaccine status: Due, Education has been provided regarding the importance of this vaccine. Advised may receive this vaccine at local pharmacy or Health Dept. Aware to provide a copy of the vaccination record if obtained from local pharmacy or Health Dept. Verbalized acceptance and understanding.  Covid-19 vaccine status: Completed vaccines  Qualifies for Shingles Vaccine? Yes   Zostavax completed No   Shingrix Completed?: No.    Education has been provided regarding the importance of this vaccine. Patient has been advised to call insurance company to determine out of pocket expense if they have not yet received this vaccine. Advised may also receive vaccine at local pharmacy or Health Dept. Verbalized acceptance and understanding.  Screening Tests Health Maintenance  Topic Date Due   TETANUS/TDAP  Never done   Zoster Vaccines- Shingrix (1 of 2) Never done   PNA vac Low Risk Adult (1 of 2 - PCV13) Never done   INFLUENZA VACCINE  08/19/2020   COVID-19 Vaccine (4 - Booster) 09/28/2020   Hepatitis C Screening  Completed   HPV VACCINES  Aged Out    Health Maintenance  Health Maintenance Due  Topic Date Due   TETANUS/TDAP  Never done   Zoster Vaccines- Shingrix (1 of 2) Never done   PNA vac Low Risk  Adult (1 of 2 - PCV13) Never done   INFLUENZA VACCINE  08/19/2020   COVID-19 Vaccine (4 - Booster) 09/28/2020    Colorectal cancer screening: No longer required.   Additional Screening:  Hepatitis C Screening:  Completed 02/26/16  Vision Screening: Recommended annual ophthalmology exams for early detection of glaucoma and other disorders of the eye. Is the patient up to date with their annual eye exam?  Yes  Who is the provider or what is the name of the office in which the patient attends annual eye exams? Dr Eliseo Squires If pt is not established with a provider, would they like to be referred to a provider to establish care? No .   Dental Screening: Recommended annual dental exams for proper oral hygiene  Community Resource Referral / Chronic Care Management: CRR required this visit?  No   CCM required this visit?  No      Plan:     I have personally reviewed and noted the following in the patient's chart:   Medical and social history Use of alcohol, tobacco or illicit drugs  Current medications and supplements including opioid prescriptions. Patient is not currently taking opioid prescriptions. Functional ability and status Nutritional status Physical activity Advanced directives List of other physicians Hospitalizations, surgeries, and ER visits in previous 12 months Vitals Screenings to include cognitive, depression, and falls Referrals and appointments  In addition, I have reviewed and discussed with patient certain preventive protocols, quality metrics, and best practice  recommendations. A written personalized care plan for preventive services as well as general preventive health recommendations were provided to patient.     Willette Brace, LPN   [phone]   Nurse Notes: None

## 2020-09-30 ENCOUNTER — Other Ambulatory Visit: Payer: Self-pay | Admitting: Interventional Cardiology

## 2020-10-10 ENCOUNTER — Other Ambulatory Visit: Payer: Self-pay | Admitting: Interventional Cardiology

## 2020-10-14 DIAGNOSIS — M25511 Pain in right shoulder: Secondary | ICD-10-CM | POA: Diagnosis not present

## 2020-10-20 ENCOUNTER — Other Ambulatory Visit: Payer: Self-pay | Admitting: Interventional Cardiology

## 2020-10-24 ENCOUNTER — Other Ambulatory Visit: Payer: Self-pay | Admitting: Gastroenterology

## 2020-10-24 DIAGNOSIS — Z86010 Personal history of colonic polyps: Secondary | ICD-10-CM | POA: Diagnosis not present

## 2020-10-24 DIAGNOSIS — R1084 Generalized abdominal pain: Secondary | ICD-10-CM

## 2020-10-28 ENCOUNTER — Ambulatory Visit
Admission: RE | Admit: 2020-10-28 | Discharge: 2020-10-28 | Disposition: A | Discharge status: Home / Self Care | Payer: Medicare Other | Source: Ambulatory Visit | Attending: Gastroenterology | Admitting: Gastroenterology

## 2020-10-28 DIAGNOSIS — R1030 Lower abdominal pain, unspecified: Secondary | ICD-10-CM | POA: Diagnosis not present

## 2020-10-28 DIAGNOSIS — I700 Atherosclerosis of aorta: Secondary | ICD-10-CM | POA: Diagnosis not present

## 2020-10-28 DIAGNOSIS — K5720 Diverticulitis of large intestine with perforation and abscess without bleeding: Secondary | ICD-10-CM | POA: Diagnosis not present

## 2020-10-28 DIAGNOSIS — R1084 Generalized abdominal pain: Secondary | ICD-10-CM

## 2020-10-28 MED ORDER — IOPAMIDOL (ISOVUE-300) INJECTION 61%
100.0000 mL | Freq: Once | INTRAVENOUS | Status: AC | PRN
  Administered 2020-10-28: 100 mL via INTRAVENOUS

## 2020-11-23 ENCOUNTER — Other Ambulatory Visit: Payer: Self-pay | Admitting: Interventional Cardiology

## 2020-11-25 DIAGNOSIS — K5732 Diverticulitis of large intestine without perforation or abscess without bleeding: Secondary | ICD-10-CM | POA: Diagnosis not present

## 2020-11-29 ENCOUNTER — Other Ambulatory Visit: Payer: Self-pay | Admitting: Interventional Cardiology

## 2020-12-05 DIAGNOSIS — Z23 Encounter for immunization: Secondary | ICD-10-CM | POA: Diagnosis not present

## 2020-12-15 ENCOUNTER — Other Ambulatory Visit: Payer: Self-pay | Admitting: Interventional Cardiology

## 2020-12-24 ENCOUNTER — Telehealth: Payer: Self-pay | Admitting: Interventional Cardiology

## 2020-12-24 ENCOUNTER — Other Ambulatory Visit: Payer: Self-pay | Admitting: Interventional Cardiology

## 2020-12-24 MED ORDER — LISINOPRIL 10 MG PO TABS: 10.0000 mg | ORAL_TABLET | Freq: Every day | ORAL | 0 refills | Status: DC

## 2020-12-24 NOTE — Telephone Encounter (Signed)
  *  STAT* If patient is at the pharmacy, call can be transferred to refill team.   1. Which medications need to be refilled? (please list name of each medication and dose if known) lisinopril (ZESTRIL) 10 MG tablet  2. Which pharmacy/location (including street and city if local pharmacy) is medication to be sent to? [id number] - [place], [place] - [address]  3. Do they need a 30 day or 90 day supply? 90 days  Pt is out of meds and needs refill asap. He made an appt with Dr. Tamala Julian 04/23/21

## 2020-12-24 NOTE — Telephone Encounter (Signed)
Pt's medication was sent to pt's pharmacy as requested. Confirmation received.  °

## 2020-12-27 DIAGNOSIS — L300 Nummular dermatitis: Secondary | ICD-10-CM | POA: Diagnosis not present

## 2020-12-27 DIAGNOSIS — Z85828 Personal history of other malignant neoplasm of skin: Secondary | ICD-10-CM | POA: Diagnosis not present

## 2020-12-27 DIAGNOSIS — L603 Nail dystrophy: Secondary | ICD-10-CM | POA: Diagnosis not present

## 2021-01-02 DIAGNOSIS — M1711 Unilateral primary osteoarthritis, right knee: Secondary | ICD-10-CM | POA: Diagnosis not present

## 2021-01-09 DIAGNOSIS — M1711 Unilateral primary osteoarthritis, right knee: Secondary | ICD-10-CM | POA: Diagnosis not present

## 2021-01-14 DIAGNOSIS — M1711 Unilateral primary osteoarthritis, right knee: Secondary | ICD-10-CM | POA: Diagnosis not present

## 2021-01-15 ENCOUNTER — Telehealth: Payer: Self-pay | Admitting: Family Medicine

## 2021-01-15 NOTE — Progress Notes (Signed)
°  Care Management   Follow Up Note   01/15/2021 Name: AVELINO HERREN MRN: [id number] DOB: [birth year]   Referred by: Vivi Barrack, MD Reason for referral : No chief complaint on file.   An unsuccessful telephone outreach was attempted today. The patient was referred to the case management team for assistance with care management and care coordination.   Follow Up Plan: The care management team will reach out to the patient again over the next 7 days.   [place]

## 2021-01-27 ENCOUNTER — Other Ambulatory Visit: Payer: Self-pay | Admitting: Interventional Cardiology

## 2021-01-30 DIAGNOSIS — R4020 Unspecified coma: Secondary | ICD-10-CM | POA: Diagnosis not present

## 2021-01-30 DIAGNOSIS — Z743 Need for continuous supervision: Secondary | ICD-10-CM | POA: Diagnosis not present

## 2021-01-30 DIAGNOSIS — R531 Weakness: Secondary | ICD-10-CM | POA: Diagnosis not present

## 2021-01-30 DIAGNOSIS — R61 Generalized hyperhidrosis: Secondary | ICD-10-CM | POA: Diagnosis not present

## 2021-01-30 DIAGNOSIS — I4891 Unspecified atrial fibrillation: Secondary | ICD-10-CM | POA: Diagnosis not present

## 2021-01-31 ENCOUNTER — Telehealth: Payer: Self-pay | Admitting: Interventional Cardiology

## 2021-01-31 NOTE — Telephone Encounter (Signed)
Pt reports an afib episode he had last night.  Per Pt he was having dinner with his brother when he felt he "started fading".  Per [name] he was "bent over for 20 minutes".  [name] called EMS.  Per Pt his heart rate was over 120 BMP.  He states EMS gave him IV fluids and offered to take him to the hospital, which Pt declined.  Pt is overdue for f/u.  He is taking amiodarone 200 mg PO daily with Toprol XL 25 mg daily.  Scheduled Pt with AT to evaluate.    Pt thanked nurse for call back.

## 2021-01-31 NOTE — Telephone Encounter (Signed)
Patient called in to say that he had afib eposide last night and the EMS came and they give him some fluids. Looking to get appt asap, inform patient they were none. Ask for the nurse to give him a call. Please advise

## 2021-02-03 ENCOUNTER — Telehealth: Payer: Self-pay | Admitting: Family Medicine

## 2021-02-03 NOTE — Progress Notes (Signed)
PCP:  Vivi Barrack, MD Primary Cardiologist: Sinclair Grooms, MD Electrophysiologist: Constance Haw, MD   Jonathan Holden is a 76 y.o. male seen today for Will Meredith Leeds, MD for acute visit due to near syncope. Long absence from clinic. Last seen by Dr. Curt Bears 2018. Last by AF clinic 2019.  Pt reports 1/12 he had an episode of "near syncope". Reports he was having dinner with his brother when he felt he "started fading".  Per [name] he was "bent over for 20 minutes".   [name] called EMS.  Per Pt his heart rate was over 120 bpm. He states EMS gave him IV fluids and offered to take him to the hospital, which Pt declined as he felt better and his HR slowed down.     Since  that episode he has had one further, while riding down a large hill.  He denies any frank syncope. Doesn't typically notice "heart racing". No chest pain or edema. Denies recent illness. Short term monitoring in the past has been relatively unrevealing, showing only brief subclinical PAF.  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   Pseudophakia of left eye 05/22/2013   Pseudophakia of right eye 06/05/2013   S/P coronary angioplasty    Streptococcal bacteremia    Past Surgical History:  Procedure Laterality Date   CARDIOVASCULAR STRESS TEST  08/22/2008   CORONARY ANGIOPLASTY  1992   ERCP W/ SPHICTEROTOMY  12/15/2002   LAPAROSCOPIC CHOLECYSTECTOMY  12/16/2002   LUMBAR  LAMINECTOMY     L5   TEE WITHOUT CARDIOVERSION N/A 02/28/2016   Procedure: TRANSESOPHAGEAL ECHOCARDIOGRAM (TEE);  Surgeon: Fay Records, MD;  Location: [facility] ENDOSCOPY;  Service: Cardiovascular;  Laterality: N/A;    Current Outpatient Medications  Medication Sig Dispense Refill   Alirocumab (PRALUENT) 150 MG/ML SOAJ Inject 1 pen into the skin every 14 (fourteen) days. 6 mL 3   amiodarone (PACERONE) 200 MG tablet Take 1 tablet (200 mg total) by mouth daily. Please schedule overdue appointment. 3rd and final attempt. thank you 15 tablet 0   clobetasol ointment (TEMOVATE) 0.86 % Apply 1 application topically 2 (two) times daily. 30 g 2   ketoconazole (NIZORAL) 2 % shampoo APPLY 1 3 TIMES A WEEK TO SCALP. LET SIT 10 MIN THEN RINSE OUT.     lisinopril (ZESTRIL) 10 MG tablet Take 1 tablet (10 mg total) by mouth daily. Please keep upcoming appointment with Dr. Tamala Julian in April 2023 before anymore refills. Thank you final Attempt attempt. Thank you 90 tablet 0   metoprolol succinate (TOPROL-XL) 25 MG 24 hr tablet Take 1 tablet (25 mg total) by mouth daily. Please keep upcoming appt in April 2023 with Dr. Tamala Julian before anymore refills. Thank you Final Attempt 90 tablet 0   Polyvinyl Alcohol (LIQUID TEARS OP) Place 1 drop into both eyes daily as needed (DRY EYE).  triamcinolone cream (KENALOG) 0.1 % as needed for rash.     valACYclovir (VALTREX) 1000 MG tablet TAKE 2 TABLETS BY MOUTH TWICE A DAY AS NEEDED FOR FLARES 180 tablet 1   No current facility-administered medications for this visit.    Allergies  Allergen Reactions   Eliquis [Apixaban] Other (See Comments)    Recurrent GI bleeding occurred from diverticulosis in the setting of anticoagulation therapy for atrial fibrillation.   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   Reaction:  Unknown    Glutamic Acid Other (See Comments)    Pt is allergic to melons.   Reaction:  Unknown    Grass Pollen(K-O-R-T-Swt Vern)    Ciprofloxacin  Hives   Pollen Extract Other (See Comments)    Sneezing     Social History   Socioeconomic History   Marital status: Divorced    Spouse name: Not on file   Number of children: Not on file   Years of education: Not on file   Highest education level: Not on file  Occupational History   Occupation: sales  Tobacco Use   Smoking status: Former    Packs/day: 2.00    Years: 20.00    Pack years: 40.00    Types: Cigarettes    Quit date: 03/27/1990    Years since quitting: 30.8   Smokeless tobacco: Never  Vaping Use   Vaping Use: Never used  Substance and Sexual Activity   Alcohol use: Yes    Comment: Quit drinking this year    Drug use: No   Sexual activity: Not Currently  Other Topics Concern   Not on file  Social History Narrative   Not on file   Social Determinants of Health   Financial Resource Strain: Low Risk    Difficulty of Paying Living Expenses: Not hard at all  Food Insecurity: No Food Insecurity   Worried About Charity fundraiser in the Last Year: Never true   [place] in the Last Year: Never true  Transportation Needs: No Transportation Needs   Lack of Transportation (Medical): No   Lack of Transportation (Non-Medical): No  Physical Activity: Insufficiently Active   Days of Exercise per Week: 3 days   Minutes of Exercise per Session: 30 min  Stress: No Stress Concern Present   Feeling of Stress : Not at all  Social Connections: Socially Isolated   Frequency of Communication with Friends and Family: More than three times a week   Frequency of Social Gatherings with Friends and Family: More than three times a week   Attends Religious Services: Never   Marine scientist or Organizations: No   Attends Music therapist: Never   Marital Status: Divorced  Human resources officer Violence: Not At Risk   Fear of Current or Ex-Partner: No   Emotionally Abused: No   Physically Abused: No   Sexually Abused: No     Review of Systems: All  other systems reviewed and are otherwise negative except as noted above.  Physical Exam: Vitals:   02/04/21 1116  BP: 116/72  Pulse: 69  SpO2: 97%  Weight: 212 lb (96.2 kg)  Height: 6\' 3"  (1.905 m)    GEN- The patient is well appearing, alert and oriented x 3 today.   HEENT: normocephalic, atraumatic; sclera clear, conjunctiva pink; hearing intact; oropharynx clear; neck supple, no JVP Lymph- no cervical lymphadenopathy Lungs- Clear to ausculation bilaterally, normal work of breathing.  No  wheezes, rales, rhonchi Heart- Regular rate and rhythm, no murmurs, rubs or gallops, PMI not laterally displaced GI- soft, non-tender, non-distended, bowel sounds present, no hepatosplenomegaly Extremities- no clubbing, cyanosis, or edema; DP/PT/radial pulses 2+ bilaterally MS- no significant deformity or atrophy Skin- warm and dry, no rash or lesion Psych- euthymic mood, full affect Neuro- strength and sensation are intact  EKG is ordered. Personal review of EKG from today shows NSR at 69 bpm   Additional studies reviewed include: Previous EP office and gen cards notes.   Assessment and Plan:  Near syncope Reports that it was associated with atrial fibrillation by EKG per EMS; not available for review. No clear picture of what was happening with potential orthostatic improvement with symptom improvement with IVF.  Place 14 day Zio. Discussed with Dr. Curt Bears and we think he is a good candidate for long term monitoring with Loop recorder.  Will schedule follow up accordingly.   2. Paroxysmal atrial fibrillation:  Only subclinical has been definitively recorder since his episode with acute illness in the hospital.  Zio as above, then good candidate for long term monitoring.  He has been intolerant to [facility] with recurrent GI bleeding. If necessary, may be good Watchman candidate.  CHA2DS2/VASc is at least 7 per chart but he has demonstrably failed [place]    3. HTN Stable on current regimen     4. HLD:  Continue current managment   5. Coronary artery disease:  Denies s/s ischemia  Continue atorvastatin, metoprolol. Per notes, avoiding antiplatelets as having recurrent GI bleeding.  Follow up with Dr. Curt Bears in  6-8 weeks for loop consideration.    Shirley Friar, PA-C  02/04/21 11:26 AM

## 2021-02-03 NOTE — Chronic Care Management (AMB) (Signed)
°  Chronic Care Management   Outreach Note  02/03/2021 Name: LEMONTE AL MRN: [id number] DOB: Apr 23, 1945  Referred by: Vivi Barrack, MD Reason for referral : No chief complaint on file.   A second unsuccessful telephone outreach was attempted today. The patient was referred to pharmacist for assistance with care management and care coordination.  Follow Up Plan:   Tatjana Dellinger Upstream Scheduler

## 2021-02-04 ENCOUNTER — Other Ambulatory Visit: Payer: Self-pay

## 2021-02-04 ENCOUNTER — Encounter: Payer: Self-pay | Admitting: Student

## 2021-02-04 ENCOUNTER — Ambulatory Visit (INDEPENDENT_AMBULATORY_CARE_PROVIDER_SITE_OTHER): Payer: Medicare Other | Admitting: Student

## 2021-02-04 ENCOUNTER — Ambulatory Visit (INDEPENDENT_AMBULATORY_CARE_PROVIDER_SITE_OTHER): Payer: Medicare Other

## 2021-02-04 VITALS — BP 116/72 | HR 69 | Ht 75.0 in | Wt 212.0 lb

## 2021-02-04 DIAGNOSIS — I5032 Chronic diastolic (congestive) heart failure: Secondary | ICD-10-CM

## 2021-02-04 DIAGNOSIS — I480 Paroxysmal atrial fibrillation: Secondary | ICD-10-CM | POA: Diagnosis not present

## 2021-02-04 DIAGNOSIS — R55 Syncope and collapse: Secondary | ICD-10-CM

## 2021-02-04 DIAGNOSIS — I25118 Atherosclerotic heart disease of native coronary artery with other forms of angina pectoris: Secondary | ICD-10-CM

## 2021-02-04 DIAGNOSIS — I10 Essential (primary) hypertension: Secondary | ICD-10-CM

## 2021-02-04 LAB — COMPREHENSIVE METABOLIC PANEL
ALT: 12 IU/L (ref 0–44)
AST: 18 IU/L (ref 0–40)
Albumin/Globulin Ratio: 2 (ref 1.2–2.2)
Albumin: 4.7 g/dL (ref 3.7–4.7)
Alkaline Phosphatase: 79 IU/L (ref 44–121)
BUN/Creatinine Ratio: 16 (ref 10–24)
BUN: 15 mg/dL (ref 8–27)
Bilirubin Total: 0.3 mg/dL (ref 0.0–1.2)
CO2: 26 mmol/L (ref 20–29)
Calcium: 9.6 mg/dL (ref 8.6–10.2)
Chloride: 102 mmol/L (ref 96–106)
Creatinine, Ser: 0.95 mg/dL (ref 0.76–1.27)
Globulin, Total: 2.4 g/dL (ref 1.5–4.5)
Glucose: 84 mg/dL (ref 70–99)
Potassium: 4.9 mmol/L (ref 3.5–5.2)
Sodium: 142 mmol/L (ref 134–144)
Total Protein: 7.1 g/dL (ref 6.0–8.5)
eGFR: 83 mL/min/{1.73_m2} (ref >59)

## 2021-02-04 LAB — CBC
Hematocrit: 45.3 % (ref 37.5–51.0)
Hemoglobin: 15 g/dL (ref 13.0–17.7)
MCH: 30 pg (ref 26.6–33.0)
MCHC: 33.1 g/dL (ref 31.5–35.7)
MCV: 91 fL (ref 79–97)
Platelets: 228 10*3/uL (ref 150–450)
RBC: 5 x10E6/uL (ref 4.14–5.80)
RDW: 14 % (ref 11.6–15.4)
WBC: 7.3 10*3/uL (ref 3.4–10.8)

## 2021-02-04 LAB — TSH: TSH: 1.78 u[IU]/mL (ref 0.450–4.500)

## 2021-02-04 NOTE — Progress Notes (Unsigned)
Enrolled patient for a 14 day Zio XT monitor to be mailed to patients home   Dr Tamala Julian to read

## 2021-02-04 NOTE — Addendum Note (Signed)
Addended by: Carylon Perches on: 02/04/2021 11:49 AM   Modules accepted: Orders

## 2021-02-04 NOTE — Patient Instructions (Signed)
Medication Instructions:  Your physician recommends that you continue on your current medications as directed. Please refer to the Current Medication list given to you today.  *If you need a refill on your cardiac medications before your next appointment, please call your pharmacy*   Lab Work: TODAY: CMET, TSH, CBC  If you have labs (blood work) drawn today and your tests are completely normal, you will receive your results only by: [facility] (if you have MyChart) OR A paper copy in the mail If you have any lab test that is abnormal or we need to change your treatment, we will call you to review the results.   Follow-Up: At [facility], you and your health needs are our priority.  As part of our continuing mission to provide you with exceptional heart care, we have created designated Provider Care Teams.  These Care Teams include your primary Cardiologist (physician) and Advanced Practice Providers (APPs -  Physician Assistants and Nurse Practitioners) who all work together to provide you with the care you need, when you need it.   Your next appointment:   6-8 week(s)   The format for your next appointment:   In Person  Provider:   Allegra Lai, MD    Other Instructions  [place] Monitor Instructions  Your physician has requested you wear a ZIO patch monitor for 14 days.  This is a single patch monitor. [employer] supplies one patch monitor per enrollment. Additional stickers are not available. Please do not apply patch if you will be having a Nuclear Stress Test,  Echocardiogram, Cardiac CT, MRI, or Chest Xray during the period you would be wearing the  monitor. The patch cannot be worn during these tests. You cannot remove and re-apply the  ZIO XT patch monitor.  Your ZIO patch monitor will be mailed 3 day USPS to your address on file. It may take 3-5 days  to receive your monitor after you have been enrolled.  Once you have received your monitor, please  review the enclosed instructions. Your monitor  has already been registered assigning a specific monitor serial # to you.  Billing and Patient Assistance Program Information  We have supplied [employer] with any of your insurance information on file for billing purposes. [employer] offers a sliding scale Patient Assistance Program for patients that do not have  insurance, or whose insurance does not completely cover the cost of the ZIO monitor.  You must apply for the Patient Assistance Program to qualify for this discounted rate.  To apply, please call [employer] at [phone], select option 4, select option 2, ask to apply for  Patient Assistance Program. Theodore Demark will ask your household income, and how many people  are in your household. They will quote your out-of-pocket cost based on that information.  [employer] will also be able to set up a 54-month, interest-free payment plan if needed.  Applying the monitor   Shave hair from upper left chest.  Hold abrader disc by orange tab. Rub abrader in 40 strokes over the upper left chest as  indicated in your monitor instructions.  Clean area with 4 enclosed alcohol pads. Let dry.  Apply patch as indicated in monitor instructions. Patch will be placed under collarbone on left  side of chest with arrow pointing upward.  Rub patch adhesive wings for 2 minutes. Remove white label marked "1". Remove the white  label marked "2". Rub patch adhesive wings for 2 additional minutes.  While looking in a mirror,  press and release button in center of patch. A small green light will  flash 3-4 times. This will be your only indicator that the monitor has been turned on.  Do not shower for the first 24 hours. You may shower after the first 24 hours.  Press the button if you feel a symptom. You will hear a small click. Record Date, Time and  Symptom in the Patient Logbook.  When you are ready to remove the patch, follow instructions on the last 2 pages of Patient   Logbook. Stick patch monitor onto the last page of Patient Logbook.  Place Patient Logbook in the blue and white box. Use locking tab on box and tape box closed  securely. The blue and white box has prepaid postage on it. Please place it in the mailbox as  soon as possible. Your physician should have your test results approximately 7 days after the  monitor has been mailed back to [facility].  Call [place] at [phone] if you have questions regarding  your ZIO XT patch monitor. Call them immediately if you see an orange light blinking on your  monitor.  If your monitor falls off in less than 4 days, contact our Monitor department at [phone].  If your monitor becomes loose or falls off after 4 days call [employer] at [phone] for  suggestions on securing your monitor

## 2021-02-05 ENCOUNTER — Other Ambulatory Visit: Payer: Self-pay | Admitting: *Deleted

## 2021-02-05 NOTE — Telephone Encounter (Signed)
Las prescribed by historical provider

## 2021-02-06 ENCOUNTER — Telehealth: Payer: Self-pay | Admitting: Family Medicine

## 2021-02-06 MED ORDER — KETOCONAZOLE 2 % EX SHAM
MEDICATED_SHAMPOO | CUTANEOUS | 3 refills | Status: DC
Start: 2021-02-06 — End: 2022-04-10

## 2021-02-06 NOTE — Chronic Care Management (AMB) (Signed)
°  Chronic Care Management   Outreach Note  02/06/2021 Name: Jonathan Holden MRN: [id number] DOB: [birth year]  Referred by: Vivi Barrack, MD Reason for referral : No chief complaint on file.   Third unsuccessful telephone outreach was attempted today. The patient was referred to the pharmacist for assistance with care management and care coordination.   Follow Up Plan:   Tatjana Dellinger Upstream Scheduler

## 2021-02-09 DIAGNOSIS — R55 Syncope and collapse: Secondary | ICD-10-CM | POA: Diagnosis not present

## 2021-02-13 ENCOUNTER — Telehealth: Payer: Self-pay | Admitting: Family Medicine

## 2021-02-13 NOTE — Chronic Care Management (AMB) (Signed)
°  Chronic Care Management   Note  02/13/2021 Name: Jonathan Holden MRN: [id number] DOB: 1945/01/22  Jonathan Holden is a 76 y.o. year old male who is a primary care patient of Vivi Barrack, MD. I reached out to [name] by phone today in response to a referral sent by [name]'s PCP, Vivi Barrack, MD.   [name] was given information about Chronic Care Management services today including:  CCM service includes personalized support from designated clinical staff supervised by his physician, including individualized plan of care and coordination with other care providers 24/7 contact phone numbers for assistance for urgent and routine care needs. Service will only be billed when office clinical staff spend 20 minutes or more in a month to coordinate care. Only one practitioner may furnish and bill the service in a calendar month. The patient may stop CCM services at any time (effective at the end of the month) by phone call to the office staff.   Patient agreed to services and verbal consent obtained.   Follow up plan:   Tatjana Secretary/administrator

## 2021-02-26 NOTE — Progress Notes (Unsigned)
Chronic Care Management Pharmacy Note  02/26/2021 Name:  Jonathan Holden MRN:  [id number] DOB:  [birth year]  Summary: ***  Recommendations/Changes made from today's visit: ***  Plan: ***   Subjective: Jonathan Holden is an 76 y.o. year old male who is a primary patient of Jerline Pain, Algis Greenhouse, MD.  The CCM team was consulted for assistance with disease management and care coordination needs.    Engaged with patient by telephone for initial visit in response to provider referral for pharmacy case management and/or care coordination services.   Consent to Services:  The patient was given the following information about Chronic Care Management services today, agreed to services, and gave verbal consent: 1. CCM service includes personalized support from designated clinical staff supervised by the primary care provider, including individualized plan of care and coordination with other care providers 2. 24/7 contact phone numbers for assistance for urgent and routine care needs. 3. Service will only be billed when office clinical staff spend 20 minutes or more in a month to coordinate care. 4. Only one practitioner may furnish and bill the service in a calendar month. 5.The patient may stop CCM services at any time (effective at the end of the month) by phone call to the office staff. 6. The patient will be responsible for cost sharing (co-pay) of up to 20% of the service fee (after annual deductible is met). Patient agreed to services and consent obtained.  Patient Care Team: Vivi Barrack, MD as PCP - General (Family Medicine) Belva Crome, MD as PCP - Cardiology (Cardiology) Constance Haw, MD as PCP - Electrophysiology (Cardiology) System, Provider Not In (Oral Surgery) Ashok Pall, MD as Consulting Physician (Neurosurgery) System, Provider Not In (Dentistry) Carlyle Basques, MD as Consulting Physician (Infectious Diseases) Latanya Maudlin, MD as Consulting Physician (Orthopedic  Surgery) Merleen Nicely, MD as Referring Physician (Dermatology) Edythe Clarity, [facility] as Pharmacist (Pharmacist)  Recent office visits:  None   Recent consult visits:  02/04/2021 OV (Cardiology) Shirley Friar, PA-C; no medication changes indicated.   Hospital visits:  None in previous 6 months    Objective:  Lab Results  Component Value Date   CREATININE 0.95 02/04/2021   BUN 15 02/04/2021   GFR 73.38 07/25/2020   EGFR 83 02/04/2021   GFRNONAA 79 05/11/2019   GFRAA 91 05/11/2019   NA 142 02/04/2021   K 4.9 02/04/2021   CALCIUM 9.6 02/04/2021   CO2 26 02/04/2021   GLUCOSE 84 02/04/2021    Lab Results  Component Value Date/Time   GFR 73.38 07/25/2020 10:16 AM   GFR 92.15 10/19/2014 09:51 AM    Last diabetic Eye exam: No results found for: HMDIABEYEEXA  Last diabetic Foot exam: No results found for: HMDIABFOOTEX   Lab Results  Component Value Date   CHOL 158 07/25/2020   HDL 43.30 07/25/2020   LDLCALC 77 05/11/2019   LDLDIRECT 80.0 07/25/2020   TRIG 229.0 (H) 07/25/2020   CHOLHDL 4 07/25/2020    Hepatic Function Latest Ref Rng & Units 02/04/2021 07/25/2020 05/11/2019  Total Protein 6.0 - 8.5 g/dL 7.1 7.3 7.0  Albumin 3.7 - 4.7 g/dL 4.7 4.3 4.6  AST 0 - 40 IU/L _0 ALT 0 - 44 IU/L _1 Alk Phosphatase 44 - 121 IU/L 79 67 93  Total Bilirubin 0.0 - 1.2 mg/dL 0.3 0.3 0.6  Bilirubin, Direct 0.00 - 0.40 mg/dL - - -    Lab Results  Component  Value Date/Time   TSH 1.780 02/04/2021 12:03 PM   TSH 1.54 07/25/2020 10:16 AM    CBC Latest Ref Rng & Units 02/04/2021 07/25/2020 05/12/2017  WBC 3.4 - 10.8 x10E3/uL 7.3 5.7 5.9  Hemoglobin 13.0 - 17.7 g/dL 15.0 13.8 14.9  Hematocrit 37.5 - 51.0 % 45.3 41.1 44.9  Platelets 150 - 450 x10E3/uL 228 199.0 232    No results found for: VD25OH  Clinical ASCVD: {YES/NO:21197} The 10-year ASCVD risk score (Arnett DK, et al., 2019) is: 25.3%   Values used to calculate the score:     Age: 15 years      Sex: Male     Is Non-Hispanic African American: No     Diabetic: No     Tobacco smoker: No     Systolic Blood Pressure: 081 mmHg     Is BP treated: Yes     HDL Cholesterol: 43.3 mg/dL     Total Cholesterol: 158 mg/dL    Depression screen [facility] 2/9 09/27/2020 07/25/2020 09/21/2019  Decreased Interest 0 0 0  Down, Depressed, Hopeless 0 0 0  PHQ - 2 Score 0 0 0  Altered sleeping - - -  Tired, decreased energy - - -  Change in appetite - - -  Feeling bad or failure about yourself  - - -  Trouble concentrating - - -  Moving slowly or fidgety/restless - - -  Suicidal thoughts - - -  PHQ-9 Score - - -  Difficult doing work/chores - - -     ***Other: (CHADS2VASc if Afib, MMRC or CAT for COPD, ACT, DEXA)  Social History   Tobacco Use  Smoking Status Former   Packs/day: 2.00   Years: 20.00   Pack years: 40.00   Types: Cigarettes   Quit date: 03/27/1990   Years since quitting: 30.9  Smokeless Tobacco Never   BP Readings from Last 3 Encounters:  02/04/21 116/72  09/27/20 118/62  07/25/20 138/80   Pulse Readings from Last 3 Encounters:  02/04/21 69  09/27/20 81  07/25/20 71   Wt Readings from Last 3 Encounters:  02/04/21 212 lb (96.2 kg)  09/27/20 215 lb 3.2 oz (97.6 kg)  07/25/20 215 lb 12.8 oz (97.9 kg)   BMI Readings from Last 3 Encounters:  02/04/21 26.50 kg/m  09/27/20 27.63 kg/m  07/25/20 27.71 kg/m    Assessment/Interventions: Review of patient past medical history, allergies, medications, health status, including review of consultants reports, laboratory and other test data, was performed as part of comprehensive evaluation and provision of chronic care management services.   SDOH:  (Social Determinants of Health) assessments and interventions performed: {yes/no:20286}  SDOH Screenings   Alcohol Screen: Not on file  Depression (PHQ2-9): Low Risk    PHQ-2 Score: 0  Financial Resource Strain: Low Risk    Difficulty of Paying Living Expenses: Not hard at all   Food Insecurity: No Food Insecurity   Worried About Charity fundraiser in the Last Year: Never true   Ran Out of Food in the Last Year: Never true  Housing: Low Risk    Last Housing Risk Score: 0  Physical Activity: Insufficiently Active   Days of Exercise per Week: 3 days   Minutes of Exercise per Session: 30 min  Social Connections: Socially Isolated   Frequency of Communication with Friends and Family: More than three times a week   Frequency of Social Gatherings with Friends and Family: More than three times a week   Attends Religious  Services: Never   Marine scientist or Organizations: No   Attends Music therapist: Never   Marital Status: Divorced  Stress: No Stress Concern Present   Feeling of Stress : Not at all  Tobacco Use: Medium Risk   Smoking Tobacco Use: Former   Smokeless Tobacco Use: Never   Passive Exposure: Not on file  Transportation Needs: No Transportation Needs   Lack of Transportation (Medical): No   Lack of Transportation (Non-Medical): No    CCM Care Plan  Allergies  Allergen Reactions   Eliquis [Apixaban] Other (See Comments)    Recurrent GI bleeding occurred from diverticulosis in the setting of anticoagulation therapy for atrial fibrillation.   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   Reaction:  Unknown    Glutamic Acid Other (See Comments)    Pt is allergic to melons.   Reaction:  Unknown    Grass Pollen(K-O-R-T-Swt Vern)    Ciprofloxacin Hives   Pollen Extract Other (See Comments)    Sneezing     Medications Reviewed Today     Reviewed by Jeremy Johann, CMA (Certified Medical Assistant) on 02/04/21 at 1118  Med List Status: <None>   Medication Order Taking? Sig Documenting Provider Last Dose Status Informant  Alirocumab (PRALUENT) 150 MG/ML SOAJ [id number] Yes Inject 1 pen into the skin every 14 (fourteen) days. Belva Crome, MD Taking Active   amiodarone (PACERONE) 200 MG tablet [id number]  Yes Take 1 tablet (200 mg total) by mouth daily. Please schedule overdue appointment. 3rd and final attempt. thank you Belva Crome, MD Taking Active   clobetasol ointment (TEMOVATE) 0.05 % [id number] Yes Apply 1 application topically 2 (two) times daily. Vivi Barrack, MD Taking Active   ketoconazole (NIZORAL) 2 % shampoo [id number] Yes APPLY 1 3 TIMES A WEEK TO SCALP. LET SIT 10 MIN THEN RINSE OUT. [provider] Taking Active   lisinopril (ZESTRIL) 10 MG tablet [id number] Yes Take 1 tablet (10 mg total) by mouth daily. Please keep upcoming appointment with Dr. Tamala Julian in April 2023 before anymore refills. Thank you final Attempt attempt. Thank you Belva Crome, MD Taking Active   metoprolol succinate (TOPROL-XL) 25 MG 24 hr tablet [id number] Yes Take 1 tablet (25 mg total) by mouth daily. Please keep upcoming appt in April 2023 with Dr. Tamala Julian before anymore refills. Thank you Final Attempt Belva Crome, MD Taking Active   Polyvinyl Alcohol (LIQUID TEARS OP) [id number] Yes Place 1 drop into both eyes daily as needed (DRY EYE). [provider] Taking Active   triamcinolone cream (KENALOG) 0.1 % [id number] Yes as needed for rash. [provider] Taking Active   valACYclovir (VALTREX) 1000 MG tablet [id number] Yes TAKE 2 TABLETS BY MOUTH TWICE A DAY AS NEEDED FOR FLARES Vivi Barrack, MD Taking Active             Patient Active Problem List   Diagnosis Date Noted   Unsteady gait 07/25/2020   CHF (congestive heart failure) ([place]) 07/25/2019   Cold sore 07/25/2019   Diverticulosis 04/18/2016   Insomnia 04/18/2016   Osteoarthritis 04/18/2016   H/O gastric ulcer    Former smoker    Gastroesophageal reflux disease    Paroxysmal atrial fibrillation with RVR ([place]) 02/21/2016   Coronary artery disease involving native coronary artery of native heart with angina pectoris ([place]) 09/19/2014   HTN (hypertension) 09/19/2014   Hyperlipidemia LDL goal <70, on Praluent  10/03/2013  Immunization History  Administered Date(s) Administered   Fluad Quad(high Dose 65+) 10/26/2018   Influenza, High Dose Seasonal PF 12/31/2016, 12/10/2017   Influenza-Unspecified 11/20/2015   Moderna Sars-Covid-2 Vaccination 07/06/2020   PFIZER(Purple Top)SARS-COV-2 Vaccination 02/27/2019, 03/24/2019    Conditions to be addressed/monitored:  CAD, HTN, Afib, CHF, GERD, HLD, Insomnia  There are no care plans that you recently modified to display for this patient.    Medication Assistance: {MEDASSISTANCEINFO:25044}  Compliance/Adherence/Medication fill history: Care Gaps: ***  Star-Rating Drugs: ***  Patient's preferred pharmacy is:  [id number] [place], [place] - [address] [place] [address]Phone: [phone]: [phone] Uses pill box? {Yes or If no, why not?:20788} Pt endorses ***% compliance  We discussed: {Pharmacy options:24294} Patient decided to: {US Pharmacy Plan:23885}  Care Plan and Follow Up Patient Decision:  {FOLLOWUP:24991}  Plan: {CM FOLLOW UP PEHUD:14970} ***  Current Barriers:  {pharmacybarriers:24917}  Pharmacist Clinical Goal(s):  Patient will {PHARMACYGOALCHOICES:24921} through collaboration with PharmD and provider.   Interventions: 1:1 collaboration with PVivi Barrack MD regarding development and update of comprehensive plan of care as evidenced by provider attestation and co-signature Inter-disciplinary care team collaboration (see longitudinal plan of care) Comprehensive medication review performed; medication list updated in electronic medical record  Hypertension (BP goal {CHL HP UPSTREAM Pharmacist BP [phone]}) -{US controlled/uncontrolled:25276} -Current treatment: Lisinopril 120mdaily Metoprolol XL 2566maily -Medications previously tried: ***  -Current home readings: *** -Current dietary habits: *** -Current exercise  habits: *** -{ACTIONS;DENIES/REPORTS:21021675::"Denies"} hypotensive/hypertensive symptoms -Educated on {CCM BP Counseling:25124} -Counseled to monitor BP at home ***, document, and provide log at future appointments -{CCMPHARMDINTERVENTION:25122}  Hyperlipidemia: (LDL goal < ***) -{US controlled/uncontrolled:25276} -Current treatment: Praluent 150m80m every 14 days -Medications previously tried: ***  -Current dietary patterns: *** -Current exercise habits: *** -Educated on {CCM HLD Counseling:25126} -{CCMPHARMDINTERVENTION:25122}  Atrial Fibrillation (Goal: prevent stroke and major bleeding) -{US controlled/uncontrolled:25276} -CHADSVASC: *** -Current treatment: Rate control: Amiodarone 200mg58mly Anticoagulation: *** -Medications previously tried: *** -Home BP and HR readings: ***  -Counseled on {CCMAFIBCOUNSELING:25120} -{CCMPHARMDINTERVENTION:25122}  Heart Failure (Goal: manage symptoms and prevent exacerbations) -{US controlled/uncontrolled:25276} -Last ejection fraction: *** (Date: ***) -HF type: {type of heart failure:30421350} -NYHA Class: {CHL HP Upstream Pharm NYHA Class:[phone]} -AHA HF Stage: {CHL HP Upstream Pharm AHA HF Stage:[phone]} -Current treatment: *** -Medications previously tried: ***  -Current home BP/HR readings: *** -Current dietary habits: *** -Current exercise habits: *** -Educated on {CCM HF Counseling:25125} -{CCMPHARMDINTERVENTION:25122}  GERD (Goal: ***) -{US controlled/uncontrolled:25276} -Current treatment  *** -Medications previously tried: ***  -{CCMPHARMDINTERVENTION:25122}  Hypothyroidism (Goal: ***) -{US controlled/uncontrolled:25276} -Current treatment  *** -Medications previously tried: ***  -{CCMPHARMDINTERVENTION:25122}  Patient Goals/Self-Care Activities Patient will:  - {pharmacypatientgoals:24919}  Follow Up Plan: {CM FOLLOW UP PLAN:YOVZ:85885}

## 2021-03-03 DIAGNOSIS — M545 Low back pain, unspecified: Secondary | ICD-10-CM | POA: Diagnosis not present

## 2021-03-04 ENCOUNTER — Telehealth: Payer: Self-pay | Admitting: Pharmacist

## 2021-03-04 NOTE — Progress Notes (Addendum)
Chronic Care Management Pharmacy Assistant   Name: Jonathan Holden  MRN: [id number] DOB: 12-04-1945   Reason for Encounter: Chart Review For Initial Visit With Clinical Pharmacist   Conditions to be addressed/monitored: CAD, PAF, HTN, CHF, HLD, GERD  Primary concerns for visit include: PAF, HTN, HLD   Recent office visits:  None  Recent consult visits:  02/04/2021 OV (Cardiology) Jonathan Friar, PA-C; no medication changes indicated.  Hospital visits:  None in previous 6 months  Medications: Outpatient Encounter Medications as of 03/04/2021  Medication Sig   Alirocumab (PRALUENT) 150 MG/ML SOAJ Inject 1 pen into the skin every 14 (fourteen) days.   amiodarone (PACERONE) 200 MG tablet Take 1 tablet (200 mg total) by mouth daily. Please schedule overdue appointment. 3rd and final attempt. thank you   clobetasol ointment (TEMOVATE) 8.33 % Apply 1 application topically 2 (two) times daily.   ketoconazole (NIZORAL) 2 % shampoo APPLY 1 3 TIMES A WEEK TO SCALP. LET SIT 10 MIN THEN RINSE OUT. Strength: 2 %   lisinopril (ZESTRIL) 10 MG tablet Take 1 tablet (10 mg total) by mouth daily. Please keep upcoming appointment with Dr. Tamala Julian in April 2023 before anymore refills. Thank you final Attempt attempt. Thank you   metoprolol succinate (TOPROL-XL) 25 MG 24 hr tablet Take 1 tablet (25 mg total) by mouth daily. Please keep upcoming appt in April 2023 with Dr. Tamala Julian before anymore refills. Thank you Final Attempt   Polyvinyl Alcohol (LIQUID TEARS OP) Place 1 drop into both eyes daily as needed (DRY EYE).   triamcinolone cream (KENALOG) 0.1 % as needed for rash.   valACYclovir (VALTREX) 1000 MG tablet TAKE 2 TABLETS BY MOUTH TWICE A DAY AS NEEDED FOR FLARES   No facility-administered encounter medications on file as of 03/04/2021.   Current Medications: Ketoconazole 2% shampoo last filled 02/06/2021 15 DS Triamcinolone 0.1% last filled 02/16/2021 30 DS Metoprolol Succinate 25  mg last filled 01/28/2021 90 DS ... Lisinopril 10 mg last filled 12/24/2020 90 DS ... Amiodarone 200 mg last filled 10/13/2020 15 DS ... Clobetasol 0.05% last filled 12/27/2020  Alirocumab 150 mg/mL last filled 02/26/2021 28 DS Valacyclovir 1000 mg last filled 01/06/2021 30 DS Polyvinyl Alcohol (Liquid Tears)  Patient Questions: Any changes in your medications or health? Patient states he had a recent episode of Afib, he had to call EMS. He had an Afib monitor removed today. His results are currently pending.  Any side effects from any medications?  "No, but I may not be taking enough of the Afib medication they gave me."  Do you have any symptoms or problems not managed by your medications? "Nothing other than the Afib."  Any concerns about your health right now? "Oh, I have lots. I have bone on bone in my shoulder, knees and in my hip. I have issues with lower back. I have arthritis in my spine making it painful to move."  Has your provider asked that you check blood pressure, blood sugar, or follow special diet at home? [facility] 134/82 HR 81, 115/69 HR 70 "I don't eat much meat especially red meat. I eat fish and vegetables."  Do you get any type of exercise on a regular basis? 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  Can you think of a goal you would like to reach for your health? "To be 20 again."  Do you  have any problems getting your medications? Patient states they are all expensive but has assistance with a medicare supplement.  Is there anything that you would like to discuss during the appointment?  "Not really, is there a cheaper alternative to Praluent?"  Please bring medications and supplements to appointment  Care Gaps: Medicare Annual Wellness: Completed 09/27/2020 Hemoglobin A1C: none available Colonoscopy: Discontinued, last completed 04/10/2014   Future  Appointments  Date Time Provider [place]  03/10/2021  9:30 AM [facility] CCM PHARMACIST [facility]  04/23/2021  8:20 AM Belva Crome, MD [facility]  06/12/2021 11:30 AM Constance Haw, MD [facility]  07/31/2021  8:00 AM Vivi Barrack, MD [facility]  10/10/2021  8:00 AM [facility] HEALTH COACH [facility]    Star Rating Drugs: Lisinopril 10 mg last filled 12/24/2020 90 DS  [name], [place] Pharmacist Assistant [phone]

## 2021-03-10 ENCOUNTER — Telehealth: Payer: Medicare Other

## 2021-03-12 DIAGNOSIS — R55 Syncope and collapse: Secondary | ICD-10-CM | POA: Diagnosis not present

## 2021-03-13 DIAGNOSIS — M5451 Vertebrogenic low back pain: Secondary | ICD-10-CM | POA: Diagnosis not present

## 2021-03-13 DIAGNOSIS — M545 Low back pain, unspecified: Secondary | ICD-10-CM | POA: Diagnosis not present

## 2021-03-24 DIAGNOSIS — M25552 Pain in left hip: Secondary | ICD-10-CM | POA: Diagnosis not present

## 2021-03-24 DIAGNOSIS — M545 Low back pain, unspecified: Secondary | ICD-10-CM | POA: Diagnosis not present

## 2021-03-29 DIAGNOSIS — Z20822 Contact with and (suspected) exposure to covid-19: Secondary | ICD-10-CM | POA: Diagnosis not present

## 2021-03-30 ENCOUNTER — Other Ambulatory Visit: Payer: Self-pay | Admitting: Interventional Cardiology

## 2021-04-01 ENCOUNTER — Other Ambulatory Visit: Payer: Self-pay | Admitting: Interventional Cardiology

## 2021-04-03 ENCOUNTER — Other Ambulatory Visit: Payer: Self-pay | Admitting: Interventional Cardiology

## 2021-04-07 ENCOUNTER — Other Ambulatory Visit: Payer: Self-pay | Admitting: Interventional Cardiology

## 2021-04-18 DIAGNOSIS — M25552 Pain in left hip: Secondary | ICD-10-CM | POA: Diagnosis not present

## 2021-04-18 DIAGNOSIS — M25562 Pain in left knee: Secondary | ICD-10-CM | POA: Diagnosis not present

## 2021-04-22 NOTE — Progress Notes (Signed)
?Cardiology Office Note:   ? ?Date:  04/23/2021  ? ?ID:  Jonathan Holden, DOB 01-07-1946, MRN [id number] ? ?PCP:  Vivi Barrack, MD  ?Cardiologist:  Sinclair Grooms, MD  ? ?Referring MD: Vivi Barrack, MD  ? ?Chief Complaint  ?Patient presents with  ? Coronary Artery Disease  ? Atrial Fibrillation  ? Loss of Consciousness  ? ? ?History of Present Illness:   ? ?Jonathan Holden is a 76 y.o. male with a hx of  coronary artery disease, right coronary angioplasty during acute infarction, history of gastric ulcer, recurrent diverticular colonic bleeding on Eliquis, paroxysmal atrial fibrillation with rhythm control on Amiodarone, lumbar discitis with streptococcal bacteremia, hyperlipidemia, and primary hypertension. ? ?In January he saw Jonathan Holden with the following as an acute work in: "Pt reports 1/12 he had an episode of "near syncope". Reports he was having dinner with his brother when he felt he "started fading".  Per [name] he was "bent over for 20 minutes". ?  ?[name] called EMS.  Per Pt his heart rate was over 120 bpm. He states EMS gave him IV fluids and offered to take him to the hospital, which Pt declined as he felt better and his HR slowed down."  This event led to a 14-day ZIO monitor with results noted below. ?  ?He has done well since this above episode.  He did have prodrome that caused him to feel weak for at least a minute prior to EMS being called.  The subsequent ZIO did not reveal any significant problem.  He denies angina.  No orthostatic dizziness, lower extremity swelling, or cardiac complaints. ? ?There is some discrepancy about whether he is taking lisinopril 10 mg/day or lisinopril HCT 10/12.5 mg/day. ? ?Past Medical History:  ?Diagnosis Date  ? Acute blood loss anemia   ? Acute lower GI bleeding 03/19/2016  ? Arthritis   ? Atrial fibrillation with RVR ([place]) 02/21/2016  ? Coronary artery disease   ? Diverticulosis 04/18/2016  ? Endocarditis of native valve   ? Former smoker    ? H/O gastric ulcer   ? History of MI (myocardial infarction) 1992  ? Hyperlipidemia   ? Hypertension   ? Infection of intervertebral disc ([place])   ? Insomnia 04/18/2016  ? Left retinal detachment 03/18/2015  ? Loose body of right shoulder   ? 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 Pseudophakia of left eye 05/22/2013  ? Pseudophakia of right eye 06/05/2013  ? S/P coronary angioplasty   ? Streptococcal bacteremia   ? ? ?Past Surgical History:  ?Procedure Laterality Date  ? CARDIOVASCULAR STRESS TEST  08/22/2008  ? CORONARY ANGIOPLASTY  1992  ? ERCP W/ SPHICTEROTOMY  12/15/2002  ? LAPAROSCOPIC CHOLECYSTECTOMY  12/16/2002  ? LUMBAR LAMINECTOMY    ? L5  ? TEE WITHOUT CARDIOVERSION N/A 02/28/2016  ? Procedure: TRANSESOPHAGEAL ECHOCARDIOGRAM (TEE);  Surgeon: Fay Records, MD;  Location: [facility];  Service: Cardiovascular;  Laterality: N/A;  ? ? ?Current Medications: ?Current Meds  ?Medication Sig  ? amiodarone (PACERONE) 200 MG tablet TAKE 1 TABLET (200 MG TOTAL) BY MOUTH DAILY. PLEASE SCHEDULE OVERDUE APPOINTMENT.  ? clobetasol ointment (TEMOVATE) 9.56 % Apply 1 application topically 2 (two) times daily.  ? ketoconazole (NIZORAL) 2 % shampoo APPLY 1 3 TIMES A WEEK TO SCALP. LET SIT 10 MIN THEN RINSE OUT. Strength: 2 %  ? lisinopril (ZESTRIL) 10 MG tablet TAKE 1 TABLET BY MOUTH DAILY. PLEASE KEEP UPCOMING  [place] IN APRIL 2023  ? metoprolol succinate (TOPROL-XL) 25 MG 24 hr tablet TAKE 1 TABLET (25 MG TOTAL) BY MOUTH DAILY. PLEASE KEEP UPCOMING APPT IN APRIL  ? Polyvinyl Alcohol (LIQUID TEARS OP) Place 1 drop into both eyes daily as needed (DRY EYE).  ? PRALUENT 150 MG/ML SOAJ INJECT 1 PEN INTO THE SKIN EVERY 14 (FOURTEEN) DAYS.  ? triamcinolone cream (KENALOG) 0.1 % as needed for rash.  ? valACYclovir (VALTREX) 1000 MG tablet TAKE 2 TABLETS BY MOUTH TWICE A DAY AS NEEDED FOR FLARES  ?  ? ?Allergies:   Eliquis [apixaban], Food, Glutamic acid, Grass pollen([id number]-o-r-t-swt vern),  Ciprofloxacin, and Pollen extract  ? ?Social History  ? ?Socioeconomic History  ? Marital status: Divorced  ?  Spouse name: Not on file  ? Number of children: Not on file  ? Years of education: Not on file  ? Highest education level: Not on file  ?Occupational History  ? Occupation: Press photographer  ?Tobacco Use  ? Smoking status: Former  ?  Packs/day: 2.00  ?  Years: 20.00  ?  Pack years: 40.00  ?  Types: Cigarettes  ?  Quit date: 03/27/1990  ?  Years since quitting: 31.0  ? Smokeless tobacco: Never  ?Vaping Use  ? Vaping Use: Never used  ?Substance and Sexual Activity  ? Alcohol use: Yes  ?  Comment: Quit drinking this year   ? Drug use: No  ? Sexual activity: Not Currently  ?Other Topics Concern  ? Not on file  ?Social History Narrative  ? Not on file  ? ?Social Determinants of Health  ? ?Financial Resource Strain: Low Risk   ? Difficulty of Paying Living Expenses: Not hard at all  ?Food Insecurity: No Food Insecurity  ? Worried About Charity fundraiser in the Last Year: Never true  ? Ran Out of Food in the Last Year: Never true  ?Transportation Needs: No Transportation Needs  ? Lack of Transportation (Medical): No  ? Lack of Transportation (Non-Medical): No  ?Physical Activity: Insufficiently Active  ? Days of Exercise per Week: 3 days  ? Minutes of Exercise per Session: 30 min  ?Stress: No Stress Concern Present  ? Feeling of Stress : Not at all  ?Social Connections: Socially Isolated  ? Frequency of Communication with Friends and Family: More than three times a week  ? Frequency of Social Gatherings with Friends and Family: More than three times a week  ? Attends Religious Services: Never  ? Active Member of Clubs or Organizations: No  ? Attends Archivist Meetings: Never  ? Marital Status: Divorced  ?  ? ?Family History: ?The patient's family history includes Emphysema in an other family member; Heart attack (age of onset: 7) in his father; Heart disease in his maternal grandfather and paternal grandfather;  Other in his brother and mother. ? ?ROS:   ?Please see the history of present illness.    ?He asked me about having a loop recorder placed at the recommendation of the EP service.  We discussed whether loop recorder is.  He is not sure he wants to have it.  I recommended that he listen carefully to the electrophysiology team and make a decision based upon his understanding.  All other systems reviewed and are negative. ? ?EKGs/Labs/Other Studies Reviewed:   ? ?The following studies were reviewed today: ? ?2-week monitor completed on 04/08/2021: ?Study Highlights ? ?  ?Normal sinus rhythm, predominates. ?Episodes of SVT, lasting less  than 20 seconds. Cannot totally exclude very brief A-fib but seems unlikely. ?No significant bradycardia ?PACs and PVCs less than 1% burden. ?  ?Benign monitor. ?  ? ? ?EKG: Personally reviewed the tracing from 02/05/2021 which reveals sinus rhythm with interventricular conduction delay. ? ?Recent Labs: ?02/04/2021: ALT 12; BUN 15; Creatinine, Ser 0.95; Hemoglobin 15.0; Platelets 228; Potassium 4.9; Sodium 142; TSH 1.780  ?Recent Lipid Panel ?   ?Component Value Date/Time  ? CHOL 158 07/25/2020 1016  ? CHOL 145 05/11/2019 0913  ? TRIG 229.0 (H) 07/25/2020 1016  ? HDL 43.30 07/25/2020 1016  ? HDL 33 (L) 05/11/2019 0913  ? CHOLHDL 4 07/25/2020 1016  ? VLDL 45.8 (H) 07/25/2020 1016  ? [place] 77 05/11/2019 0913  ? LDLDIRECT 80.0 07/25/2020 1016  ? ? ?Physical Exam:   ? ?VS:  BP (!) 142/78   Pulse 81   Ht '6\' 3"'$  (1.905 m)   Wt 210 lb 6.4 oz (95.4 kg)   SpO2 98%   BMI 26.30 kg/m?    ? ?Wt Readings from Last 3 Encounters:  ?04/23/21 210 lb 6.4 oz (95.4 kg)  ?02/04/21 212 lb (96.2 kg)  ?09/27/20 215 lb 3.2 oz (97.6 kg)  ?  ? ?GEN: Overweight slightly.. No acute distress ?HEENT: Normal ?NECK: No JVD. ?LYMPHATICS: No lymphadenopathy ?CARDIAC: No murmur. RRR no gallop, or edema. ?VASCULAR:  Normal Pulses. No bruits. ?RESPIRATORY:  Clear to auscultation without rales, wheezing or rhonchi   ?ABDOMEN: Soft, non-tender, non-distended, No pulsatile mass, ?MUSCULOSKELETAL: No deformity  ?SKIN: Warm and dry ?NEUROLOGIC:  Alert and oriented x 3 ?PSYCHIATRIC:  Normal affect  ? ?ASSESSMENT:   ? ?1. Coronary artery di

## 2021-04-23 ENCOUNTER — Ambulatory Visit (INDEPENDENT_AMBULATORY_CARE_PROVIDER_SITE_OTHER): Payer: Medicare Other | Admitting: Interventional Cardiology

## 2021-04-23 ENCOUNTER — Encounter: Payer: Self-pay | Admitting: Interventional Cardiology

## 2021-04-23 VITALS — BP 142/78 | HR 81 | Ht 75.0 in | Wt 210.4 lb

## 2021-04-23 DIAGNOSIS — E785 Hyperlipidemia, unspecified: Secondary | ICD-10-CM

## 2021-04-23 DIAGNOSIS — R55 Syncope and collapse: Secondary | ICD-10-CM | POA: Diagnosis not present

## 2021-04-23 DIAGNOSIS — I5032 Chronic diastolic (congestive) heart failure: Secondary | ICD-10-CM

## 2021-04-23 DIAGNOSIS — Z79899 Other long term (current) drug therapy: Secondary | ICD-10-CM | POA: Diagnosis not present

## 2021-04-23 DIAGNOSIS — I10 Essential (primary) hypertension: Secondary | ICD-10-CM | POA: Diagnosis not present

## 2021-04-23 DIAGNOSIS — I25118 Atherosclerotic heart disease of native coronary artery with other forms of angina pectoris: Secondary | ICD-10-CM | POA: Diagnosis not present

## 2021-04-23 DIAGNOSIS — I480 Paroxysmal atrial fibrillation: Secondary | ICD-10-CM | POA: Diagnosis not present

## 2021-04-23 MED ORDER — AMIODARONE HCL 200 MG PO TABS: ORAL_TABLET | ORAL | 3 refills | Status: DC

## 2021-04-23 MED ORDER — METOPROLOL SUCCINATE ER 25 MG PO TB24: ORAL_TABLET | ORAL | 3 refills | Status: DC

## 2021-04-23 MED ORDER — LISINOPRIL 10 MG PO TABS: ORAL_TABLET | ORAL | 3 refills | Status: DC

## 2021-04-23 NOTE — Patient Instructions (Signed)

## 2021-04-24 ENCOUNTER — Encounter: Payer: Self-pay | Admitting: Interventional Cardiology

## 2021-04-24 NOTE — Telephone Encounter (Signed)
No encounter needed

## 2021-05-07 ENCOUNTER — Other Ambulatory Visit: Payer: Self-pay | Admitting: Gastroenterology

## 2021-05-07 DIAGNOSIS — M25552 Pain in left hip: Secondary | ICD-10-CM | POA: Diagnosis not present

## 2021-05-18 DIAGNOSIS — Z20822 Contact with and (suspected) exposure to covid-19: Secondary | ICD-10-CM | POA: Diagnosis not present

## 2021-05-27 ENCOUNTER — Ambulatory Visit: Payer: Medicare Other | Admitting: Cardiology

## 2021-05-28 DIAGNOSIS — Z20822 Contact with and (suspected) exposure to covid-19: Secondary | ICD-10-CM | POA: Diagnosis not present

## 2021-06-05 DIAGNOSIS — M25552 Pain in left hip: Secondary | ICD-10-CM | POA: Diagnosis not present

## 2021-06-11 ENCOUNTER — Telehealth: Payer: Self-pay | Admitting: *Deleted

## 2021-06-11 NOTE — Telephone Encounter (Signed)
   Primary Cardiologist: Sinclair Grooms, MD  Chart reviewed as part of pre-operative protocol coverage. Given past medical history and time since last visit, based on ACC/AHA guidelines, JAVARIUS TSOSIE would be at acceptable risk for the planned procedure without further cardiovascular testing.   His RCRI is a class, 11% risk of major cardiac event.  I will route this recommendation to the requesting party via Epic fax function and remove from pre-op pool.  Please call with questions.  Jossie Ng. Berthe Oley NP-C    06/11/2021, 10:18 AM [address] Office [phone] Fax [phone]

## 2021-06-11 NOTE — Telephone Encounter (Signed)
   Pre-operative Risk Assessment    Patient Name: Jonathan Holden  DOB: 18-Jun-1945 MRN: [id number]      Request for Surgical Clearance    Procedure:   LEFT TOTAL HIP ARTHROPLASTY  Date of Surgery:  Clearance 07/23/21                                 Surgeon:  DR. Gaynelle Arabian Surgeon's Group or Practice Name:  [facility] Phone number:  [phone] Fax number:  [phone] ATTN: [name]   Type of Clearance Requested:   - Medical    Type of Anesthesia:   CHOICE   Additional requests/questions:    Jiles Prows   06/11/2021, 9:57 AM

## 2021-06-12 ENCOUNTER — Ambulatory Visit: Payer: Medicare Other | Admitting: Cardiology

## 2021-07-08 NOTE — H&P (Signed)
TOTAL HIP ADMISSION H&P  Patient is admitted for left total hip arthroplasty.  Subjective:  Chief Complaint: Left hip pain  HPI: Jonathan Holden, 76 y.o. male, has a history of pain and functional disability in the left hip due to arthritis and patient has failed non-surgical conservative treatments for greater than 12 weeks to include corticosteriod injections and activity modification. Onset of symptoms was gradual, starting  several  years ago with gradually worsening course since that time. The patient noted no past surgery on the left hip. Patient currently rates pain in the left hip at 7 out of 10 with activity. Patient has night pain, worsening of pain with activity and weight bearing, pain that interfers with activities of daily living, and pain with passive range of motion. Patient has evidence of  bone-on-bone arthritis in the left hip  by imaging studies. This condition presents safety issues increasing the risk of falls. There is no current active infection.  Patient Active Problem List   Diagnosis Date Noted   Unsteady gait 07/25/2020   CHF (congestive heart failure) ([place]) 07/25/2019   Cold sore 07/25/2019   Diverticulosis 04/18/2016   Insomnia 04/18/2016   Osteoarthritis 04/18/2016   H/O gastric ulcer    Former smoker    Gastroesophageal reflux disease    Paroxysmal atrial fibrillation with RVR ([place]) 02/21/2016   Coronary artery disease involving native coronary artery of native heart with angina pectoris ([place]) 09/19/2014   HTN (hypertension) 09/19/2014   Hyperlipidemia LDL goal <70, on Praluent 10/03/2013    Past Medical History:  Diagnosis Date   Acute blood loss anemia    Acute lower GI bleeding 03/19/2016   Arthritis    Atrial fibrillation with RVR ([place]) 02/21/2016   Coronary artery disease    Diverticulosis 04/18/2016   Endocarditis of native valve    Former smoker    H/O gastric ulcer    History of MI (myocardial infarction) 1992   Hyperlipidemia     Hypertension    Infection of intervertebral disc ([place])    Insomnia 04/18/2016   Left retinal detachment 03/18/2015   Loose body of right shoulder    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   Pseudophakia of left eye 05/22/2013   Pseudophakia of right eye 06/05/2013   S/P coronary angioplasty    Streptococcal bacteremia     Past Surgical History:  Procedure Laterality Date   CARDIOVASCULAR STRESS TEST  08/22/2008   CORONARY ANGIOPLASTY  1992   ERCP W/ SPHICTEROTOMY  12/15/2002   LAPAROSCOPIC CHOLECYSTECTOMY  12/16/2002   LUMBAR LAMINECTOMY     L5   TEE WITHOUT CARDIOVERSION N/A 02/28/2016   Procedure: TRANSESOPHAGEAL ECHOCARDIOGRAM (TEE);  Surgeon: Fay Records, MD;  Location: [facility] ENDOSCOPY;  Service: Cardiovascular;  Laterality: N/A;    Prior to Admission medications   Medication Sig Start Date End Date Taking? Authorizing Provider  amiodarone (PACERONE) 200 MG tablet TAKE 1 TABLET (200 MG TOTAL) BY MOUTH DAILY. \ 04/23/21   Belva Crome, MD  clobetasol ointment (TEMOVATE) 2.42 % Apply 1 application topically 2 (two) times daily. 07/25/20   Vivi Barrack, MD  ketoconazole (NIZORAL) 2 % shampoo APPLY 1 3 TIMES A WEEK TO SCALP. LET SIT 10 MIN THEN RINSE OUT. Strength: 2 % 02/06/21   Vivi Barrack, MD  lisinopril (ZESTRIL) 10 MG tablet TAKE 1 TABLET BY MOUTH DAILY. PLEASE KEEP UPCOMING APPOINTMENT WITH DR. Tamala Julian IN [place]lu[place] 2023 04/23/21   Belva Crome, MD  metoprolol succinate (TOPROL-XL)  25 MG 24 hr tablet TAKE 1 TABLET (25 MG TOTAL) BY MOUTH DAILY. PLEASE KEEP UPCOMING APPT IN [place] 04/23/21   Belva Crome, MD  Polyvinyl Alcohol (LIQUID TEARS OP) Place 1 drop into both eyes daily as needed (DRY EYE).    [provider]  PRALUENT 150 MG/ML SOAJ INJECT 1 PEN INTO THE SKIN EVERY 14 (FOURTEEN) DAYS. 04/07/21   Belva Crome, MD  triamcinolone cream (KENALOG) 0.1 % as needed for rash. 12/27/20   [provider]  valACYclovir (VALTREX) 1000 MG tablet TAKE 2  TABLETS BY MOUTH TWICE A DAY AS NEEDED FOR FLARES 02/09/20   Vivi Barrack, MD    Allergies  Allergen Reactions   Eliquis [Apixaban] Other (See Comments)    Recurrent GI bleeding occurred from diverticulosis in the setting of anticoagulation therapy for atrial fibrillation.   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   Reaction:  Unknown    Glutamic Acid Other (See Comments)    Pt is allergic to melons.   Reaction:  Unknown    Grass Pollen(K-O-R-T-Swt Vern)    Ciprofloxacin Hives   Pollen Extract Other (See Comments)    Sneezing     Social History   Socioeconomic History   Marital status: Divorced    Spouse name: Not on file   Number of children: Not on file   Years of education: Not on file   Highest education level: Not on file  Occupational History   Occupation: sales  Tobacco Use   Smoking status: Former    Packs/day: 2.00    Years: 20.00    Total pack years: 40.00    Types: Cigarettes    Quit date: 03/27/1990    Years since quitting: 31.3   Smokeless tobacco: Never  Vaping Use   Vaping Use: Never used  Substance and Sexual Activity   Alcohol use: Yes    Comment: Quit drinking this year    Drug use: No   Sexual activity: Not Currently  Other Topics Concern   Not on file  Social History Narrative   Not on file   Social Determinants of Health   Financial Resource Strain: Low Risk  (09/27/2020)   Overall Financial Resource Strain (CARDIA)    Difficulty of Paying Living Expenses: Not hard at all  Food Insecurity: No Food Insecurity (09/27/2020)   Hunger Vital Sign    Worried About Running Out of Food in the Last Year: Never true    [place] in the Last Year: Never true  Transportation Needs: No Transportation Needs (09/27/2020)   PRAPARE - Hydrologist (Medical): No    Lack of Transportation (Non-Medical): No  Physical Activity: Insufficiently Active (09/27/2020)   Exercise Vital Sign    Days of Exercise per Week:  3 days    Minutes of Exercise per Session: 30 min  Stress: No Stress Concern Present (09/27/2020)   [place]    Feeling of Stress : Not at all  Social Connections: Socially Isolated (09/27/2020)   Social Connection and Isolation Panel [NHANES]    Frequency of Communication with Friends and Family: More than three times a week    Frequency of Social Gatherings with Friends and Family: More than three times a week    Attends Religious Services: Never    Marine scientist or Organizations: No    Attends Archivist  Meetings: Never    Marital Status: Divorced  Human resources officer Violence: Not At Risk (09/27/2020)   Humiliation, Afraid, Rape, and Kick questionnaire    Fear of Current or Ex-Partner: No    Emotionally Abused: No    Physically Abused: No    Sexually Abused: No    Tobacco Use: Medium Risk (04/23/2021)   Patient History    Smoking Tobacco Use: Former    Smokeless Tobacco Use: Never    Passive Exposure: Not on file   Social History   Substance and Sexual Activity  Alcohol Use Yes   Comment: Quit drinking this year     Family History  Problem Relation Age of Onset   Other Mother        AGE 13 HEALTHY   Heart attack Father 73       2 MI   Heart disease Maternal Grandfather    Heart disease Paternal Grandfather    Emphysema Other    Other Brother     Review of Systems  Constitutional:  Negative for chills and fever.  HENT:  Negative for congestion, sore throat and tinnitus.   Eyes:  Negative for double vision, photophobia and pain.  Respiratory:  Negative for cough, shortness of breath and wheezing.   Cardiovascular:  Negative for chest pain, palpitations and orthopnea.  Gastrointestinal:  Negative for heartburn, nausea and vomiting.  Genitourinary:  Negative for dysuria, frequency and urgency.  Musculoskeletal:  Positive for joint pain.  Neurological:  Negative for dizziness, weakness  and headaches.     Objective:  Physical Exam: Well nourished and well developed.  General: Alert and oriented x3, cooperative and pleasant, no acute distress.  Head: normocephalic, atraumatic, neck supple.  Eyes: EOMI.  Musculoskeletal:  Left Hip Exam:  The range of motion: Flexion to 100 degrees, Internal Rotation is minimal, External Rotation to 20 degrees, and abduction to 20 degrees without discomfort.  There is no tenderness over the greater trochanteric bursa.   Calves soft and nontender. Motor function intact in LE. Strength 5/5 LE bilaterally. Neuro: Distal pulses 2+. Sensation to light touch intact in LE.   Imaging Review Plain radiographs demonstrate severe degenerative joint disease of the left hip. The bone quality appears to be adequate for age and reported activity level.  Assessment/Plan:  End stage arthritis, left hip  The patient history, physical examination, clinical judgement of the provider and imaging studies are consistent with end stage degenerative joint disease of the left hip and total hip arthroplasty is deemed medically necessary. The treatment options including medical management, injection therapy, arthroscopy and arthroplasty were discussed at length. The risks and benefits of total hip arthroplasty were presented and reviewed. The risks due to aseptic loosening, infection, stiffness, dislocation/subluxation, thromboembolic complications and other imponderables were discussed. The patient acknowledged the explanation, agreed to proceed with the plan and consent was signed. Patient is being admitted for inpatient treatment for surgery, pain control, PT, OT, prophylactic antibiotics, VTE prophylaxis, progressive ambulation and ADLs and discharge planning.The patient is planning to be discharged  home .   Patient's anticipated LOS is less than 2 midnights, meeting these requirements: - Lives within 1 hour of care - Has a competent adult at home to recover  with post-op recover - NO history of  - Chronic pain requiring opioids  - Diabetes  - Heart failure  - Heart attack  - Stroke  - DVT/VTE  - Cardiac arrhythmia  - Respiratory Failure/COPD  - Renal failure  - Anemia  -  Advanced Liver disease  Therapy Plans: HEP Disposition: Home with brother Planned DVT Prophylaxis: Aspirin 325 mg BID DME Needed: None PCP: Dimas Chyle, MD Cardiologist: Daneen Schick, MD (clearance received) TXA: IV Allergies: Ciprofloxacin Anesthesia Concerns: None BMI: 27 Last HgbA1c: Not diabetic Pharmacy: [facility]  Other: -Taking clearance form by Dr. Marigene Ehlers office - One isolated event of Afib. History of bleeding polyps requiring blood transfusion. Discussed Aspirin with Dr. Wynelle Link.  - Patient was instructed on what medications to stop prior to surgery. - Follow-up visit in 2 weeks with Dr. Wynelle Link - Begin physical therapy following surgery - Pre-operative lab work as pre-surgical testing - Prescriptions will be provided in hospital at time of discharge  Theresa Duty, PA-C Orthopedic Surgery [facility] [place]

## 2021-07-09 NOTE — Progress Notes (Signed)
DUE TO COVID-19 ONLY 2  VISITOR IS ALLOWED TO COME WITH YOU AND STAY IN THE WAITING ROOM ONLY DURING PRE OP AND PROCEDURE DAY OF SURGERY.   4 VISITOR  MAY VISIT WITH YOU AFTER SURGERY IN YOUR PRIVATE ROOM DURING VISITING HOURS ONLY! YOU MAY HAVE ONE PERSON SPEND THE NITE WITH YOU IN YOUR ROOM AFTER SURGERY.     Your procedure is scheduled on:   07/23/2021   Report to [facility] Main  Entrance   Report to admitting at   0600 am      Call this number if you have problems the morning of surgery [phone]    REMEMBER: NO  SOLID FOODS , CANDY, GUM OR MINTS AFTER Plain .       Marland Kitchen CLEAR LIQUIDS UNTIL    0530am             DAY OF SURGERY.      PLEASE FINISH ENSURE DRINK PER SURGEON ORDER  WHICH NEEDS TO BE COMPLETED AT     0530am      MORNING OF SURGERY.       CLEAR LIQUID DIET   Foods Allowed      WATER BLACK COFFEE ( SUGAR OK, NO MILK, CREAM OR CREAMER) REGULAR AND DECAF  TEA ( SUGAR OK NO MILK, CREAM, OR CREAMER) REGULAR AND DECAF  PLAIN JELLO ( NO RED)  FRUIT ICES ( NO RED, NO FRUIT PULP)  POPSICLES ( NO RED)  JUICE- APPLE, WHITE GRAPE AND WHITE CRANBERRY  SPORT DRINK LIKE GATORADE ( NO RED)  CLEAR BROTH ( VEGETABLE , CHICKEN OR BEEF)                                                                     BRUSH YOUR TEETH MORNING OF SURGERY AND RINSE YOUR MOUTH OUT, NO CHEWING GUM CANDY OR MINTS.     Take these medicines the morning of surgery with A SIP OF WATER:  amiodarone, toprol    DO NOT TAKE ANY DIABETIC MEDICATIONS DAY OF YOUR SURGERY                               You may not have any metal on your body including hair pins and              piercings  Do not wear jewelry, make-up, lotions, powders or perfumes, deodorant             Do not wear nail polish on your fingernails.              IF YOU ARE A MALE AND WANT TO SHAVE UNDER ARMS OR LEGS PRIOR TO SURGERY YOU MUST DO SO AT LEAST 48 HOURS PRIOR TO SURGERY.              Men may shave  face and neck.   Do not bring valuables to the hospital. [place].  Contacts, dentures or bridgework may not be worn into surgery.  Leave suitcase in the car. After surgery it may be brought  to your room.     Patients discharged the day of surgery will not be allowed to drive home. IF YOU ARE HAVING SURGERY AND GOING HOME THE SAME DAY, YOU MUST HAVE AN ADULT TO DRIVE YOU HOME AND BE WITH YOU FOR 24 HOURS. YOU MAY GO HOME BY TAXI OR UBER OR ORTHERWISE, BUT AN ADULT MUST ACCOMPANY YOU HOME AND STAY WITH YOU FOR 24 HOURS.                Please read over the following fact sheets you were given: _____________________________________________________________________  [facility] - Preparing for Surgery Before surgery, you can play an important role.  Because skin is not sterile, your skin needs to be as free of germs as possible.  You can reduce the number of germs on your skin by washing with CHG (chlorahexidine gluconate) soap before surgery.  CHG is an antiseptic cleaner which kills germs and bonds with the skin to continue killing germs even after washing. Please DO NOT use if you have an allergy to CHG or antibacterial soaps.  If your skin becomes reddened/irritated stop using the CHG and inform your nurse when you arrive at Short Stay. Do not shave (including legs and underarms) for at least 48 hours prior to the first CHG shower.  You may shave your face/neck. Please follow these instructions carefully:  1.  Shower with CHG Soap the night before surgery and the  morning of Surgery.  2.  If you choose to wash your hair, wash your hair first as usual with your  normal  shampoo.  3.  After you shampoo, rinse your hair and body thoroughly to remove the  shampoo.                           4.  Use CHG as you would any other liquid soap.  You can apply chg directly  to the skin and wash                       Gently with a scrungie or clean  washcloth.  5.  Apply the CHG Soap to your body ONLY FROM THE NECK DOWN.   Do not use on face/ open                           Wound or open sores. Avoid contact with eyes, ears mouth and genitals (private parts).                       Wash face,  Genitals (private parts) with your normal soap.             6.  Wash thoroughly, paying special attention to the area where your surgery  will be performed.  7.  Thoroughly rinse your body with warm water from the neck down.  8.  DO NOT shower/wash with your normal soap after using and rinsing off  the CHG Soap.                9.  Pat yourself dry with a clean towel.            10.  Wear clean pajamas.            11.  Place clean sheets on your bed the night of your first shower and do not  sleep with pets. Day of Surgery :  Do not apply any lotions/deodorants the morning of surgery.  Please wear clean clothes to the hospital/surgery center.  FAILURE TO FOLLOW THESE INSTRUCTIONS MAY RESULT IN THE CANCELLATION OF YOUR SURGERY PATIENT SIGNATURE_________________________________  NURSE SIGNATURE__________________________________  ________________________________________________________________________

## 2021-07-09 NOTE — Progress Notes (Signed)
Anesthesia Review:  PCP: Cardiologist : Coletta Memos, NP telephone encounter - cardiac clearance dated 06/11/21  Chest x-ray : EKG : 12/05/21  Monitor- 03/17/21  Echo : Stress test: Cardiac Cath :  Activity level:  Sleep Study/ CPAP : Fasting Blood Sugar :      / Checks Blood Sugar -- times a day:   Blood Thinner/ Instructions /Last Dose: ASA / Instructions/ Last Dose :

## 2021-07-15 ENCOUNTER — Encounter (HOSPITAL_COMMUNITY)
Admission: RE | Admit: 2021-07-15 | Discharge: 2021-07-15 | Disposition: A | Discharge status: Home / Self Care | Payer: Medicare Other | Source: Ambulatory Visit | Attending: Orthopedic Surgery | Admitting: Orthopedic Surgery

## 2021-07-15 ENCOUNTER — Other Ambulatory Visit: Payer: Self-pay

## 2021-07-15 ENCOUNTER — Encounter (HOSPITAL_COMMUNITY): Payer: Self-pay

## 2021-07-15 VITALS — BP 124/83 | HR 80 | Temp 98.2°F | Resp 16 | Ht 74.0 in | Wt 210.0 lb

## 2021-07-15 DIAGNOSIS — Z01812 Encounter for preprocedural laboratory examination: Secondary | ICD-10-CM | POA: Insufficient documentation

## 2021-07-15 DIAGNOSIS — I10 Essential (primary) hypertension: Secondary | ICD-10-CM | POA: Diagnosis not present

## 2021-07-15 DIAGNOSIS — I2510 Atherosclerotic heart disease of native coronary artery without angina pectoris: Secondary | ICD-10-CM | POA: Insufficient documentation

## 2021-07-15 DIAGNOSIS — M1612 Unilateral primary osteoarthritis, left hip: Secondary | ICD-10-CM | POA: Diagnosis not present

## 2021-07-15 DIAGNOSIS — I4891 Unspecified atrial fibrillation: Secondary | ICD-10-CM | POA: Insufficient documentation

## 2021-07-15 DIAGNOSIS — Z87891 Personal history of nicotine dependence: Secondary | ICD-10-CM | POA: Diagnosis not present

## 2021-07-15 DIAGNOSIS — Z01818 Encounter for other preprocedural examination: Secondary | ICD-10-CM

## 2021-07-15 HISTORY — DX: Acute myocardial infarction, unspecified: I21.9

## 2021-07-15 HISTORY — DX: Cardiac arrhythmia, unspecified: I49.9

## 2021-07-15 LAB — BASIC METABOLIC PANEL
Anion gap: 7 (ref 5–15)
BUN: 19 mg/dL (ref 8–23)
CO2: 25 mmol/L (ref 22–32)
Calcium: 9.5 mg/dL (ref 8.9–10.3)
Chloride: 107 mmol/L (ref 98–111)
Creatinine, Ser: 0.88 mg/dL (ref 0.61–1.24)
GFR, Estimated: >60 mL/min (ref >60)
Glucose, Bld: 102 mg/dL — ABNORMAL HIGH (ref 70–99)
Potassium: 4.6 mmol/L (ref 3.5–5.1)
Sodium: 139 mmol/L (ref 135–145)

## 2021-07-15 LAB — SURGICAL PCR SCREEN
MRSA, PCR: NEGATIVE
Staphylococcus aureus: POSITIVE — AB

## 2021-07-15 LAB — CBC
HCT: 40.6 % (ref 39.0–52.0)
Hemoglobin: 13.6 g/dL (ref 13.0–17.0)
MCH: 32.7 pg (ref 26.0–34.0)
MCHC: 33.5 g/dL (ref 30.0–36.0)
MCV: 97.6 fL (ref 80.0–100.0)
Platelets: 173 10*3/uL (ref 150–400)
RBC: 4.16 MIL/uL — ABNORMAL LOW (ref 4.22–5.81)
RDW: 14.9 % (ref 11.5–15.5)
WBC: 7.3 10*3/uL (ref 4.0–10.5)
nRBC: 0 % (ref 0.0–0.2)

## 2021-07-16 NOTE — Anesthesia Preprocedure Evaluation (Addendum)
Anesthesia Evaluation  Patient identified by MRN, date of birth, ID band Patient awake    Reviewed: Allergy & Precautions, NPO status , Patient's Chart, lab work & pertinent test results, reviewed documented beta blocker date and time   History of Anesthesia Complications Negative for: history of anesthetic complications  Airway Mallampati: III  TM Distance: >3 FB Neck ROM: Full    Dental no notable dental hx.    Pulmonary former smoker,    Pulmonary exam normal        Cardiovascular hypertension, Pt. on medications and Pt. on home beta blockers + CAD, + Past MI (1992) and +CHF  Normal cardiovascular exam+ dysrhythmias Atrial Fibrillation      Neuro/Psych negative neurological ROS  negative psych ROS   GI/Hepatic Neg liver ROS, GERD  ,Hx of GIB   Endo/Other  negative endocrine ROS  Renal/GU negative Renal ROS  negative genitourinary   Musculoskeletal  (+) Arthritis ,   Abdominal   Peds  Hematology negative hematology ROS (+)   Anesthesia Other Findings Day of surgery medications reviewed with patient.  Reproductive/Obstetrics negative OB ROS                           Anesthesia Physical Anesthesia Plan  ASA: 3  Anesthesia Plan: Spinal   Post-op Pain Management: Ofirmev IV (intra-op)*   Induction:   PONV Risk Score and Plan: 1 and Treatment may vary due to age or medical condition, Dexamethasone, Ondansetron and Propofol infusion  Airway Management Planned: Natural Airway and Simple Face Mask  Additional Equipment: None  Intra-op Plan:   Post-operative Plan:   Informed Consent: I have reviewed the patients History and Physical, chart, labs and discussed the procedure including the risks, benefits and alternatives for the proposed anesthesia with the patient or authorized representative who has indicated his/her understanding and acceptance.       Plan Discussed with:  CRNA  Anesthesia Plan Comments: (See PAT note 07/15/2021)      Anesthesia Quick Evaluation

## 2021-07-16 NOTE — Progress Notes (Signed)
Anesthesia Chart Review   Case: [id number] Date/Time: 07/23/21 0815   Procedure: TOTAL HIP ARTHROPLASTY ANTERIOR APPROACH (Left: Hip)   Anesthesia type: Choice   Pre-op diagnosis: left hip osteoarthritis   Location: [facility] ROOM 10 / [facility]   Surgeons: Gaynelle Arabian, MD       DISCUSSION:75 y.o. former smoker with h/o HTN, CAD, a-fib (h/o GI bleed no anticoagulant), left hip OA scheduled for above procedure 07/23/2021 with Dr. Gaynelle Arabian.   Per cardiology preoperative evaluation 06/11/2021, "Chart reviewed as part of pre-operative protocol coverage. Given past medical history and time since last visit, based on ACC/AHA guidelines, Jonathan Holden would be at acceptable risk for the planned procedure without further cardiovascular testing.    His RCRI is a class, 11% risk of major cardiac event."  Anticipate pt can proceed with planned procedure barring acute status change.   VS: BP 124/83   Pulse 80   Temp 36.8 C   Resp 16   Ht '6\' 2"'$  (1.88 m)   Wt 95.3 kg   SpO2 95%   BMI 26.96 kg/m   PROVIDERS: Vivi Barrack, MD is PCP   Primary Cardiologist: Sinclair Grooms, MD LABS: Labs reviewed: Acceptable for surgery. (all labs ordered are listed, but only abnormal results are displayed)  Labs Reviewed  SURGICAL PCR SCREEN - Abnormal; Notable for the following components:      Result Value   Staphylococcus aureus POSITIVE (*)    All other components within normal limits  CBC - Abnormal; Notable for the following components:   RBC 4.16 (*)    All other components within normal limits  BASIC METABOLIC PANEL - Abnormal; Notable for the following components:   Glucose, Bld 102 (*)    All other components within normal limits  TYPE AND SCREEN     IMAGES:   EKG: 02/04/2021 Rate 69 bpm    CV: Echo 03/11/2016 - Left ventricle: The cavity size was normal. Wall thickness was    increased in a pattern of mild LVH. The estimated ejection    fraction was 55%. Wall motion was  normal; there were no regional    wall motion abnormalities. Doppler parameters are consistent with    abnormal left ventricular relaxation (grade 1 diastolic    dysfunction).  - Aortic valve: There was no stenosis. There was trivial    regurgitation.  - Mitral valve: There was no significant regurgitation.  - Right ventricle: The cavity size was normal. Systolic function    was normal.  - Pulmonary arteries: No complete TR doppler jet so unable to    estimate PA systolic pressure.  - Inferior vena cava: The vessel was normal in size. The    respirophasic diameter changes were in the normal range (= 50%),    consistent with normal central venous pressure.  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   Pseudophakia of left eye 05/22/2013   Pseudophakia of right eye 06/05/2013   S/P coronary angioplasty    Streptococcal bacteremia     Past Surgical History:  Procedure Laterality Date   CARDIOVASCULAR STRESS TEST  08/22/2008   CORONARY ANGIOPLASTY  1992   ERCP W/ SPHICTEROTOMY  12/15/2002   LAPAROSCOPIC CHOLECYSTECTOMY  12/16/2002   LUMBAR LAMINECTOMY     L5   RIGHT SHOULDER SURGERY      TEE WITHOUT CARDIOVERSION N/A 02/28/2016   Procedure: TRANSESOPHAGEAL ECHOCARDIOGRAM (TEE);  Surgeon: Fay Records, MD;  Location: [place];  Service: Cardiovascular;  Laterality: N/A;    MEDICATIONS:  amiodarone (PACERONE) 200 MG tablet    clobetasol ointment (TEMOVATE) 0.05 %   fexofenadine (ALLEGRA) 180 MG tablet   ketoconazole (NIZORAL) 2 % shampoo   lisinopril (ZESTRIL) 10 MG tablet   metoprolol succinate (TOPROL-XL) 25 MG 24 hr tablet   Polyvinyl Alcohol (LIQUID TEARS OP)   PRALUENT 150 MG/ML SOAJ   triamcinolone cream (KENALOG) 0.1 %   valACYclovir (VALTREX) 1000 MG tablet   No current facility-administered medications for this encounter.     Konrad Felix Ward, PA-C [facility] Pre-Surgical Testing [phone]

## 2021-07-23 ENCOUNTER — Encounter (HOSPITAL_COMMUNITY): Payer: Self-pay | Admitting: Orthopedic Surgery

## 2021-07-23 ENCOUNTER — Ambulatory Visit (HOSPITAL_COMMUNITY): Payer: Medicare Other | Admitting: Physician Assistant

## 2021-07-23 ENCOUNTER — Ambulatory Visit (HOSPITAL_COMMUNITY): Payer: Medicare Other

## 2021-07-23 ENCOUNTER — Observation Stay (HOSPITAL_COMMUNITY): Payer: Medicare Other

## 2021-07-23 ENCOUNTER — Other Ambulatory Visit: Payer: Self-pay

## 2021-07-23 ENCOUNTER — Observation Stay (HOSPITAL_COMMUNITY)
Admission: RE | Admit: 2021-07-23 | Discharge: 2021-07-25 | Disposition: A | Discharge status: Home / Self Care | Payer: Medicare Other | Source: Ambulatory Visit | Attending: Orthopedic Surgery | Admitting: Orthopedic Surgery

## 2021-07-23 ENCOUNTER — Encounter (HOSPITAL_COMMUNITY)
Admission: RE | Disposition: A | Discharge status: Home / Self Care | Payer: Self-pay | Source: Ambulatory Visit | Attending: Orthopedic Surgery

## 2021-07-23 ENCOUNTER — Ambulatory Visit (HOSPITAL_BASED_OUTPATIENT_CLINIC_OR_DEPARTMENT_OTHER): Payer: Medicare Other | Admitting: Registered Nurse

## 2021-07-23 DIAGNOSIS — Z79899 Other long term (current) drug therapy: Secondary | ICD-10-CM | POA: Insufficient documentation

## 2021-07-23 DIAGNOSIS — I509 Heart failure, unspecified: Secondary | ICD-10-CM | POA: Diagnosis not present

## 2021-07-23 DIAGNOSIS — I4891 Unspecified atrial fibrillation: Secondary | ICD-10-CM | POA: Diagnosis not present

## 2021-07-23 DIAGNOSIS — I2510 Atherosclerotic heart disease of native coronary artery without angina pectoris: Secondary | ICD-10-CM | POA: Diagnosis not present

## 2021-07-23 DIAGNOSIS — I110 Hypertensive heart disease with heart failure: Secondary | ICD-10-CM

## 2021-07-23 DIAGNOSIS — M1612 Unilateral primary osteoarthritis, left hip: Secondary | ICD-10-CM | POA: Diagnosis not present

## 2021-07-23 DIAGNOSIS — I252 Old myocardial infarction: Secondary | ICD-10-CM | POA: Diagnosis not present

## 2021-07-23 DIAGNOSIS — Z01818 Encounter for other preprocedural examination: Secondary | ICD-10-CM

## 2021-07-23 DIAGNOSIS — Z87891 Personal history of nicotine dependence: Secondary | ICD-10-CM | POA: Insufficient documentation

## 2021-07-23 DIAGNOSIS — Z96642 Presence of left artificial hip joint: Secondary | ICD-10-CM | POA: Diagnosis not present

## 2021-07-23 DIAGNOSIS — Z471 Aftercare following joint replacement surgery: Secondary | ICD-10-CM | POA: Diagnosis not present

## 2021-07-23 DIAGNOSIS — M169 Osteoarthritis of hip, unspecified: Secondary | ICD-10-CM | POA: Diagnosis present

## 2021-07-23 HISTORY — PX: TOTAL HIP ARTHROPLASTY: SHX124

## 2021-07-23 LAB — TYPE AND SCREEN
ABO/RH(D): B POS
Antibody Screen: NEGATIVE

## 2021-07-23 SURGERY — ARTHROPLASTY, HIP, TOTAL, ANTERIOR APPROACH
Anesthesia: Spinal | Site: Hip | Laterality: Left

## 2021-07-23 MED ORDER — BISACODYL 10 MG RE SUPP
10.0000 mg | Freq: Every day | RECTAL | Status: DC | PRN
  Administered 2021-07-25: 10 mg via RECTAL
  Filled 2021-07-23: qty 1

## 2021-07-23 MED ORDER — CHLORHEXIDINE GLUCONATE 0.12 % MT SOLN
15.0000 mL | Freq: Once | OROMUCOSAL | Status: AC
  Administered 2021-07-23: 15 mL via OROMUCOSAL

## 2021-07-23 MED ORDER — BUPIVACAINE IN DEXTROSE 0.75-8.25 % IT SOLN
INTRATHECAL | Status: DC | PRN
  Administered 2021-07-23: 1.7 mL via INTRATHECAL

## 2021-07-23 MED ORDER — FENTANYL CITRATE (PF) 100 MCG/2ML IJ SOLN
INTRAMUSCULAR | Status: DC | PRN
Start: 2021-07-23 — End: 2021-07-23
  Administered 2021-07-23: 50 ug via INTRAVENOUS

## 2021-07-23 MED ORDER — CEFAZOLIN SODIUM-DEXTROSE 2-4 GM/100ML-% IV SOLN
2.0000 g | INTRAVENOUS | Status: AC
  Administered 2021-07-23: 2 g via INTRAVENOUS
  Filled 2021-07-23: qty 100

## 2021-07-23 MED ORDER — WATER FOR IRRIGATION, STERILE IR SOLN
Status: DC | PRN
  Administered 2021-07-23: 2000 mL

## 2021-07-23 MED ORDER — ACETAMINOPHEN 325 MG PO TABS: 325.0000 mg | ORAL_TABLET | Freq: Four times a day (QID) | ORAL | Status: DC | PRN

## 2021-07-23 MED ORDER — LACTATED RINGERS IV SOLN: INTRAVENOUS | Status: DC

## 2021-07-23 MED ORDER — ONDANSETRON HCL 4 MG/2ML IJ SOLN: 4.0000 mg | Freq: Four times a day (QID) | INTRAMUSCULAR | Status: DC | PRN

## 2021-07-23 MED ORDER — ASPIRIN 325 MG PO TBEC
325.0000 mg | DELAYED_RELEASE_TABLET | Freq: Two times a day (BID) | ORAL | Status: DC
  Administered 2021-07-24 – 2021-07-25 (×3): 325 mg via ORAL
  Filled 2021-07-23 (×3): qty 1

## 2021-07-23 MED ORDER — MENTHOL 3 MG MT LOZG: 1.0000 | LOZENGE | OROMUCOSAL | Status: DC | PRN

## 2021-07-23 MED ORDER — METHOCARBAMOL 500 MG IVPB - SIMPLE MED: 500.0000 mg | Freq: Four times a day (QID) | INTRAVENOUS | Status: DC | PRN

## 2021-07-23 MED ORDER — AMIODARONE HCL 200 MG PO TABS
200.0000 mg | ORAL_TABLET | Freq: Every day | ORAL | Status: DC
  Administered 2021-07-24: 200 mg via ORAL
  Filled 2021-07-23 (×2): qty 1

## 2021-07-23 MED ORDER — ONDANSETRON HCL 4 MG/2ML IJ SOLN
INTRAMUSCULAR | Status: DC | PRN
  Administered 2021-07-23: 4 mg via INTRAVENOUS

## 2021-07-23 MED ORDER — ONDANSETRON HCL 4 MG/2ML IJ SOLN
INTRAMUSCULAR | Status: AC
  Filled 2021-07-23: qty 2

## 2021-07-23 MED ORDER — SODIUM CHLORIDE 0.9 % IV SOLN: INTRAVENOUS | Status: DC

## 2021-07-23 MED ORDER — DOCUSATE SODIUM 100 MG PO CAPS
100.0000 mg | ORAL_CAPSULE | Freq: Two times a day (BID) | ORAL | Status: DC
  Administered 2021-07-23 – 2021-07-25 (×5): 100 mg via ORAL
  Filled 2021-07-23 (×5): qty 1

## 2021-07-23 MED ORDER — FENTANYL CITRATE (PF) 100 MCG/2ML IJ SOLN
INTRAMUSCULAR | Status: AC
  Filled 2021-07-23: qty 2

## 2021-07-23 MED ORDER — DEXAMETHASONE SODIUM PHOSPHATE 10 MG/ML IJ SOLN
INTRAMUSCULAR | Status: AC
  Filled 2021-07-23: qty 1

## 2021-07-23 MED ORDER — LORATADINE 10 MG PO TABS
10.0000 mg | ORAL_TABLET | Freq: Every day | ORAL | Status: DC
  Administered 2021-07-24: 10 mg via ORAL
  Filled 2021-07-23 (×2): qty 1

## 2021-07-23 MED ORDER — PHENOL 1.4 % MT LIQD: 1.0000 | OROMUCOSAL | Status: DC | PRN

## 2021-07-23 MED ORDER — FLEET ENEMA 7-19 GM/118ML RE ENEM: 1.0000 | ENEMA | Freq: Once | RECTAL | Status: DC | PRN

## 2021-07-23 MED ORDER — OXYCODONE HCL 5 MG PO TABS: 5.0000 mg | ORAL_TABLET | Freq: Once | ORAL | Status: DC | PRN

## 2021-07-23 MED ORDER — TRANEXAMIC ACID-NACL 1000-0.7 MG/100ML-% IV SOLN
1000.0000 mg | INTRAVENOUS | Status: AC
  Administered 2021-07-23: 1000 mg via INTRAVENOUS
  Filled 2021-07-23: qty 100

## 2021-07-23 MED ORDER — 0.9 % SODIUM CHLORIDE (POUR BTL) OPTIME
TOPICAL | Status: DC | PRN
  Administered 2021-07-23: 1000 mL

## 2021-07-23 MED ORDER — ACETAMINOPHEN 500 MG PO TABS: 1000.0000 mg | ORAL_TABLET | Freq: Once | ORAL | Status: DC

## 2021-07-23 MED ORDER — ONDANSETRON HCL 4 MG PO TABS: 4.0000 mg | ORAL_TABLET | Freq: Four times a day (QID) | ORAL | Status: DC | PRN

## 2021-07-23 MED ORDER — TRAMADOL HCL 50 MG PO TABS
50.0000 mg | ORAL_TABLET | Freq: Four times a day (QID) | ORAL | Status: DC | PRN
  Administered 2021-07-23 (×2): 50 mg via ORAL
  Filled 2021-07-23 (×2): qty 1

## 2021-07-23 MED ORDER — MIDAZOLAM HCL 2 MG/2ML IJ SOLN
INTRAMUSCULAR | Status: AC
  Filled 2021-07-23: qty 2

## 2021-07-23 MED ORDER — DEXAMETHASONE SODIUM PHOSPHATE 10 MG/ML IJ SOLN
8.0000 mg | Freq: Once | INTRAMUSCULAR | Status: AC
  Administered 2021-07-23: 8 mg via INTRAVENOUS

## 2021-07-23 MED ORDER — PROPOFOL 10 MG/ML IV BOLUS
INTRAVENOUS | Status: AC
  Filled 2021-07-23: qty 20

## 2021-07-23 MED ORDER — BUPIVACAINE-EPINEPHRINE 0.25% -1:200000 IJ SOLN
INTRAMUSCULAR | Status: AC
Start: 2021-07-23 — End: ?
  Filled 2021-07-23: qty 1

## 2021-07-23 MED ORDER — OXYCODONE HCL 5 MG/5ML PO SOLN: 5.0000 mg | Freq: Once | ORAL | Status: DC | PRN

## 2021-07-23 MED ORDER — METOPROLOL SUCCINATE ER 25 MG PO TB24
25.0000 mg | ORAL_TABLET | Freq: Every day | ORAL | Status: DC
  Administered 2021-07-24: 25 mg via ORAL
  Filled 2021-07-23: qty 1

## 2021-07-23 MED ORDER — DIPHENHYDRAMINE HCL 12.5 MG/5ML PO ELIX: 12.5000 mg | ORAL_SOLUTION | ORAL | Status: DC | PRN

## 2021-07-23 MED ORDER — METOCLOPRAMIDE HCL 5 MG PO TABS: 5.0000 mg | ORAL_TABLET | Freq: Three times a day (TID) | ORAL | Status: DC | PRN

## 2021-07-23 MED ORDER — DEXAMETHASONE SODIUM PHOSPHATE 10 MG/ML IJ SOLN
10.0000 mg | Freq: Once | INTRAMUSCULAR | Status: AC
  Administered 2021-07-24: 10 mg via INTRAVENOUS
  Filled 2021-07-23: qty 1

## 2021-07-23 MED ORDER — METHOCARBAMOL 500 MG PO TABS
500.0000 mg | ORAL_TABLET | Freq: Four times a day (QID) | ORAL | Status: DC | PRN
  Administered 2021-07-23 – 2021-07-24 (×4): 500 mg via ORAL
  Filled 2021-07-23 (×5): qty 1

## 2021-07-23 MED ORDER — METOCLOPRAMIDE HCL 5 MG/ML IJ SOLN: 5.0000 mg | Freq: Three times a day (TID) | INTRAMUSCULAR | Status: DC | PRN

## 2021-07-23 MED ORDER — LIDOCAINE 2% (20 MG/ML) 5 ML SYRINGE
INTRAMUSCULAR | Status: DC | PRN
  Administered 2021-07-23: 50 mg via INTRAVENOUS

## 2021-07-23 MED ORDER — MORPHINE SULFATE (PF) 2 MG/ML IV SOLN: 0.5000 mg | INTRAVENOUS | Status: DC | PRN

## 2021-07-23 MED ORDER — FENTANYL CITRATE PF 50 MCG/ML IJ SOSY: 25.0000 ug | PREFILLED_SYRINGE | INTRAMUSCULAR | Status: DC | PRN

## 2021-07-23 MED ORDER — HYDROCODONE-ACETAMINOPHEN 5-325 MG PO TABS
1.0000 | ORAL_TABLET | ORAL | Status: DC | PRN
  Administered 2021-07-23: 1 via ORAL
  Administered 2021-07-23 – 2021-07-24 (×4): 2 via ORAL
  Administered 2021-07-25: 1 via ORAL
  Administered 2021-07-25 (×3): 2 via ORAL
  Filled 2021-07-23 (×5): qty 2
  Filled 2021-07-23: qty 1
  Filled 2021-07-23: qty 2
  Filled 2021-07-23: qty 1
  Filled 2021-07-23: qty 2

## 2021-07-23 MED ORDER — PROPOFOL 10 MG/ML IV BOLUS
INTRAVENOUS | Status: DC | PRN
  Administered 2021-07-23: 20 mg via INTRAVENOUS
  Administered 2021-07-23: 75 ug/kg/min via INTRAVENOUS

## 2021-07-23 MED ORDER — POVIDONE-IODINE 10 % EX SWAB: 2.0000 "application " | Freq: Once | CUTANEOUS | Status: DC

## 2021-07-23 MED ORDER — CEFAZOLIN SODIUM-DEXTROSE 2-4 GM/100ML-% IV SOLN
2.0000 g | Freq: Four times a day (QID) | INTRAVENOUS | Status: AC
  Administered 2021-07-23 (×2): 2 g via INTRAVENOUS
  Filled 2021-07-23 (×2): qty 100

## 2021-07-23 MED ORDER — VALACYCLOVIR HCL 500 MG PO TABS
1000.0000 mg | ORAL_TABLET | Freq: Every day | ORAL | Status: DC
  Filled 2021-07-23 (×2): qty 2

## 2021-07-23 MED ORDER — POLYETHYLENE GLYCOL 3350 17 G PO PACK: 17.0000 g | PACK | Freq: Every day | ORAL | Status: DC | PRN

## 2021-07-23 MED ORDER — BUPIVACAINE-EPINEPHRINE (PF) 0.25% -1:200000 IJ SOLN
INTRAMUSCULAR | Status: DC | PRN
  Administered 2021-07-23: 30 mL via PERINEURAL

## 2021-07-23 MED ORDER — ORAL CARE MOUTH RINSE: 15.0000 mL | Freq: Once | OROMUCOSAL | Status: AC

## 2021-07-23 MED ORDER — POVIDONE-IODINE 10 % EX SWAB
2.0000 | Freq: Once | CUTANEOUS | Status: AC
  Administered 2021-07-23: 2 via TOPICAL

## 2021-07-23 MED ORDER — PROPOFOL 1000 MG/100ML IV EMUL
INTRAVENOUS | Status: AC
Start: 2021-07-23 — End: ?
  Filled 2021-07-23: qty 100

## 2021-07-23 MED ORDER — PHENYLEPHRINE HCL-NACL 20-0.9 MG/250ML-% IV SOLN
INTRAVENOUS | Status: AC
  Filled 2021-07-23: qty 250

## 2021-07-23 MED ORDER — ACETAMINOPHEN 10 MG/ML IV SOLN
1000.0000 mg | Freq: Four times a day (QID) | INTRAVENOUS | Status: DC
  Administered 2021-07-23: 1000 mg via INTRAVENOUS
  Filled 2021-07-23: qty 100

## 2021-07-23 MED ORDER — MIDAZOLAM HCL 5 MG/5ML IJ SOLN
INTRAMUSCULAR | Status: DC | PRN
  Administered 2021-07-23: 2 mg via INTRAVENOUS

## 2021-07-23 SURGICAL SUPPLY — 47 items
ARTICULEZE HEAD (Hips) ×2 IMPLANT
BAG COUNTER SPONGE SURGICOUNT (BAG) ×1 IMPLANT
BAG DECANTER FOR FLEXI CONT (MISCELLANEOUS) IMPLANT
BAG ZIPLOCK 12X15 (MISCELLANEOUS) IMPLANT
BLADE SAG 18X100X1.27 (BLADE) ×2 IMPLANT
CLSR STERI-STRIP ANTIMIC 1/2X4 (GAUZE/BANDAGES/DRESSINGS) ×1 IMPLANT
COVER PERINEAL POST (MISCELLANEOUS) ×2 IMPLANT
COVER SURGICAL LIGHT HANDLE (MISCELLANEOUS) ×2 IMPLANT
CUP ACET PINNACLE SECTR 56MM (Hips) IMPLANT
DRAPE FOOT SWITCH (DRAPES) ×2 IMPLANT
DRAPE STERI IOBAN 125X83 (DRAPES) ×2 IMPLANT
DRAPE U-SHAPE 47X51 STRL (DRAPES) ×4 IMPLANT
DRSG AQUACEL AG ADV 3.5X10 (GAUZE/BANDAGES/DRESSINGS) ×2 IMPLANT
DURAPREP 26ML APPLICATOR (WOUND CARE) ×2 IMPLANT
ELECT REM PT RETURN 15FT ADLT (MISCELLANEOUS) ×2 IMPLANT
GLOVE BIO SURGEON STRL SZ 6.5 (GLOVE) ×1 IMPLANT
GLOVE BIO SURGEON STRL SZ7.5 (GLOVE) IMPLANT
GLOVE BIO SURGEON STRL SZ8 (GLOVE) ×2 IMPLANT
GLOVE BIOGEL PI IND STRL 6.5 (GLOVE) IMPLANT
GLOVE BIOGEL PI IND STRL 7.0 (GLOVE) IMPLANT
GLOVE BIOGEL PI IND STRL 8 (GLOVE) ×1 IMPLANT
GLOVE BIOGEL PI INDICATOR 6.5 (GLOVE)
GLOVE BIOGEL PI INDICATOR 7.0 (GLOVE) ×1
GLOVE BIOGEL PI INDICATOR 8 (GLOVE) ×1
GOWN STRL REUS W/ TWL LRG LVL3 (GOWN DISPOSABLE) ×1 IMPLANT
GOWN STRL REUS W/ TWL XL LVL3 (GOWN DISPOSABLE) IMPLANT
GOWN STRL REUS W/TWL LRG LVL3 (GOWN DISPOSABLE) ×2
GOWN STRL REUS W/TWL XL LVL3 (GOWN DISPOSABLE)
HEAD ARTICULEZE (Hips) IMPLANT
HOLDER FOLEY CATH W/STRAP (MISCELLANEOUS) ×2 IMPLANT
KIT TURNOVER KIT A (KITS) ×1 IMPLANT
LINER MARATHON 4 NEUTRAL 36X56 (Hips) ×1 IMPLANT
MANIFOLD NEPTUNE II (INSTRUMENTS) ×2 IMPLANT
PACK ANTERIOR HIP CUSTOM (KITS) ×2 IMPLANT
PENCIL SMOKE EVACUATOR COATED (MISCELLANEOUS) ×2 IMPLANT
PINNACLE SECTOR CUP 56MM (Hips) ×2 IMPLANT
SPIKE FLUID TRANSFER (MISCELLANEOUS) ×2 IMPLANT
STEM FEM ACTIS STD SZ10 (Stem) ×1 IMPLANT
STRIP CLOSURE SKIN 1/2X4 (GAUZE/BANDAGES/DRESSINGS) ×2 IMPLANT
SUT ETHIBOND NAB CT1 #1 30IN (SUTURE) ×2 IMPLANT
SUT MNCRL AB 4-0 PS2 18 (SUTURE) ×2 IMPLANT
SUT STRATAFIX 0 PDS 27 VIOLET (SUTURE) ×2
SUT VIC AB 2-0 CT1 27 (SUTURE) ×4
SUT VIC AB 2-0 CT1 TAPERPNT 27 (SUTURE) ×2 IMPLANT
SUTURE STRATFX 0 PDS 27 VIOLET (SUTURE) ×1 IMPLANT
TRAY FOLEY MTR SLVR 16FR STAT (SET/KITS/TRAYS/PACK) ×2 IMPLANT
TUBE SUCTION HIGH CAP CLEAR NV (SUCTIONS) ×2 IMPLANT

## 2021-07-23 NOTE — Discharge Instructions (Signed)
°Frank Aluisio, MD °Total Joint Specialist °[facility] °[address] °[place],  [address] °[phone] ° °ANTERIOR APPROACH TOTAL HIP REPLACEMENT POSTOPERATIVE DIRECTIONS ° ° ° ° °Hip Rehabilitation, Guidelines Following Surgery  °The results of a hip operation are greatly improved after range of motion and muscle strengthening exercises. Follow all safety measures which are given to protect your hip. If any of these exercises cause increased pain or swelling in your joint, decrease the amount until you are comfortable again. Then slowly increase the exercises. Call your caregiver if you have problems or questions.  ° °BLOOD CLOT PREVENTION °Take a 325 mg Aspirin two times a day for three weeks following surgery. Then take an 81 mg Aspirin once a day for three weeks. Then discontinue Aspirin. °You may resume your vitamins/supplements upon discharge from the hospital. °Do not take any NSAIDs (Advil, Aleve, Ibuprofen, Meloxicam, etc.) until you have discontinued the 325 mg Aspirin. ° °HOME CARE INSTRUCTIONS  °Remove items at home which could result in a fall. This includes throw rugs or furniture in walking pathways.  °ICE to the affected hip as frequently as 20-30 minutes an hour and then as needed for pain and swelling. Continue to use ice on the hip for pain and swelling from surgery. You may notice swelling that will progress down to the foot and ankle. This is normal after surgery. Elevate the leg when you are not up walking on it.   °Continue to use the breathing machine which will help keep your temperature down.  It is common for your temperature to cycle up and down following surgery, especially at night when you are not up moving around and exerting yourself.  The breathing machine keeps your lungs expanded and your temperature down. ° °DIET °You may resume your previous home diet once your are discharged from the hospital. ° °DRESSING / WOUND CARE / SHOWERING °You have  an adhesive waterproof bandage over the incision. Leave this in place until your first follow-up appointment. Once you remove this you will not need to place another bandage.  °You may begin showering 3 days following surgery, but do not submerge the incision under water. ° °ACTIVITY °For the first 3-5 days, it is important to rest and keep the operative leg elevated. You should, as a general rule, rest for 50 minutes and walk/stretch for 10 minutes per hour. After 5 days, you may slowly increase activity as tolerated.  °Perform the exercises you were provided twice a day for about 15-20 minutes each session. Begin these 2 days following surgery. °Walk with your walker as instructed. Use the walker until you are comfortable transitioning to a cane. Walk with the cane in the opposite hand of the operative leg. You may discontinue the cane once you are comfortable and walking steadily. °Avoid periods of inactivity such as sitting longer than an hour when not asleep. This helps prevent blood clots.  °Do not drive a car for 6 weeks or until released by your surgeon.  °Do not drive while taking narcotics. ° °TED HOSE STOCKINGS °Wear the elastic stockings on both legs for three weeks following surgery during the day. You may remove them at night while sleeping. ° °WEIGHT BEARING °Weight bearing as tolerated with assist device (walker, cane, etc) as directed, use it as long as suggested by your surgeon or therapist, typically at least 4-6 weeks. ° °POSTOPERATIVE CONSTIPATION PROTOCOL °Constipation - defined medically as fewer than three stools per week and severe constipation as   less than one stool per week. ° °One of the most common issues patients have following surgery is constipation.  Even if you have a regular bowel pattern at home, your normal regimen is likely to be disrupted due to multiple reasons following surgery.  Combination of anesthesia, postoperative narcotics, change in appetite and fluid intake all can  affect your bowels.  In order to avoid complications following surgery, here are some recommendations in order to help you during your recovery period. ° °Colace (docusate) - Pick up an over-the-counter form of Colace or another stool softener and take twice a day as long as you are requiring postoperative pain medications.  Take with a full glass of water daily.  If you experience loose stools or diarrhea, hold the colace until you stool forms back up.  If your symptoms do not get better within 1 week or if they get worse, check with your doctor. °Dulcolax (bisacodyl) - Pick up over-the-counter and take as directed by the product packaging as needed to assist with the movement of your bowels.  Take with a full glass of water.  Use this product as needed if not relieved by Colace only.  °MiraLax (polyethylene glycol) - Pick up over-the-counter to have on hand.  MiraLax is a solution that will increase the amount of water in your bowels to assist with bowel movements.  Take as directed and can mix with a glass of water, juice, soda, coffee, or tea.  Take if you go more than two days without a movement.Do not use MiraLax more than once per day. Call your doctor if you are still constipated or irregular after using this medication for 7 days in a row. ° °If you continue to have problems with postoperative constipation, please contact the office for further assistance and recommendations.  If you experience "the worst abdominal pain ever" or develop nausea or vomiting, please contact the office immediatly for further recommendations for treatment. ° °ITCHING ° If you experience itching with your medications, try taking only a single pain pill, or even half a pain pill at a time.  You can also use Benadryl over the counter for itching or also to help with sleep.  ° °MEDICATIONS °See your medication summary on the “After Visit Summary” that the nursing staff will review with you prior to discharge.  You may have some home  medications which will be placed on hold until you complete the course of blood thinner medication.  It is important for you to complete the blood thinner medication as prescribed by your surgeon.  Continue your approved medications as instructed at time of discharge. ° °PRECAUTIONS °If you experience chest pain or shortness of breath - call 911 immediately for transfer to the hospital emergency department.  °If you develop a fever greater that 101 F, purulent drainage from wound, increased redness or drainage from wound, foul odor from the wound/dressing, or calf pain - CONTACT YOUR SURGEON.   °                                                °FOLLOW-UP APPOINTMENTS °Make sure you keep all of your appointments after your operation with your surgeon and caregivers. You should call the office at the above phone number and make an appointment for approximately two weeks after the date of your surgery or on the   date instructed by your surgeon outlined in the "After Visit Summary". ° °RANGE OF MOTION AND STRENGTHENING EXERCISES  °These exercises are designed to help you keep full movement of your hip joint. Follow your caregiver's or physical therapist's instructions. Perform all exercises about fifteen times, three times per day or as directed. Exercise both hips, even if you have had only one joint replacement. These exercises can be done on a training (exercise) mat, on the floor, on a table or on a bed. Use whatever works the best and is most comfortable for you. Use music or television while you are exercising so that the exercises are a pleasant break in your day. This will make your life better with the exercises acting as a break in routine you can look forward to.  °Lying on your back, slowly slide your foot toward your buttocks, raising your knee up off the floor. Then slowly slide your foot back down until your leg is straight again.  °Lying on your back spread your legs as far apart as you can without causing  discomfort.  °Lying on your side, raise your upper leg and foot straight up from the floor as far as is comfortable. Slowly lower the leg and repeat.  °Lying on your back, tighten up the muscle in the front of your thigh (quadriceps muscles). You can do this by keeping your leg straight and trying to raise your heel off the floor. This helps strengthen the largest muscle supporting your knee.  °Lying on your back, tighten up the muscles of your buttocks both with the legs straight and with the knee bent at a comfortable angle while keeping your heel on the floor.  ° °POST-OPERATIVE OPIOID TAPER INSTRUCTIONS: °It is important to wean off of your opioid medication as soon as possible. If you do not need pain medication after your surgery it is ok to stop day one. °Opioids include: °Codeine, Hydrocodone(Norco, Vicodin), Oxycodone(Percocet, oxycontin) and hydromorphone amongst others.  °Long term and even short term use of opiods can cause: °Increased pain response °Dependence °Constipation °Depression °Respiratory depression °And more.  °Withdrawal symptoms can include °Flu like symptoms °Nausea, vomiting °And more °Techniques to manage these symptoms °Hydrate well °Eat regular healthy meals °Stay active °Use relaxation techniques(deep breathing, meditating, yoga) °Do Not substitute Alcohol to help with tapering °If you have been on opioids for less than two weeks and do not have pain than it is ok to stop all together.  °Plan to wean off of opioids °This plan should start within one week post op of your joint replacement. °Maintain the same interval or time between taking each dose and first decrease the dose.  °Cut the total daily intake of opioids by one tablet each day °Next start to increase the time between doses. °The last dose that should be eliminated is the evening dose.  ° °IF YOU ARE TRANSFERRED TO A SKILLED REHAB FACILITY °If the patient is transferred to a skilled rehab facility following release from the  hospital, a list of the current medications will be sent to the facility for the patient to continue.  When discharged from the skilled rehab facility, please have the facility set up the patient's Home Health Physical Therapy prior to being released. Also, the skilled facility will be responsible for providing the patient with their medications at time of release from the facility to include their pain medication, the muscle relaxants, and their blood thinner medication. If the patient is still at the rehab facility   at time of the two week follow up appointment, the skilled rehab facility will also need to assist the patient in arranging follow up appointment in our office and any transportation needs. ° °MAKE SURE YOU:  °Understand these instructions.  °Get help right away if you are not doing well or get worse.  ° ° °DENTAL ANTIBIOTICS: ° °In most cases prophylactic antibiotics for Dental procdeures after total joint surgery are not necessary. ° °Exceptions are as follows: ° °1. History of prior total joint infection ° °2. Severely immunocompromised (Organ Transplant, cancer chemotherapy, Rheumatoid biologic °meds such as Humera) ° °3. Poorly controlled diabetes (A1C &gt; 8.0, blood glucose over 200) ° °If you have one of these conditions, contact your surgeon for an antibiotic prescription, prior to your °dental procedure.  ° ° °Pick up stool softner and laxative for home use following surgery while on pain medications. °Do not submerge incision under water. °Please use good hand washing techniques while changing dressing each day. °May shower starting three days after surgery. °Please use a clean towel to pat the incision dry following showers. °Continue to use ice for pain and swelling after surgery. °Do not use any lotions or creams on the incision until instructed by your surgeon. ° °

## 2021-07-23 NOTE — Anesthesia Postprocedure Evaluation (Signed)
Anesthesia Post Note  Patient: Jonathan Holden  Procedure(s) Performed: TOTAL HIP ARTHROPLASTY ANTERIOR APPROACH (Left: Hip)     Patient location during evaluation: PACU Anesthesia Type: Spinal Level of consciousness: awake and alert Pain management: pain level controlled Vital Signs Assessment: post-procedure vital signs reviewed and stable Respiratory status: spontaneous breathing, nonlabored ventilation and respiratory function stable Cardiovascular status: blood pressure returned to baseline Postop Assessment: no apparent nausea or vomiting, spinal receding, no headache and no backache Anesthetic complications: no   No notable events documented.  Last Vitals:  Vitals:   07/23/21 1030 07/23/21 1100  BP: 138/85 139/87  Pulse: 69 70  Resp: 13 15  Temp:    SpO2: 96% 94%    Last Pain:  Vitals:   07/23/21 1100  TempSrc:   PainSc: 0-No pain                 Marthenia Rolling

## 2021-07-23 NOTE — Interval H&P Note (Signed)
History and Physical Interval Note:  07/23/2021 7:33 AM  Jonathan Holden  has presented today for surgery, with the diagnosis of left hip osteoarthritis.  The various methods of treatment have been discussed with the patient and family. After consideration of risks, benefits and other options for treatment, the patient has consented to  Procedure(s): TOTAL HIP ARTHROPLASTY ANTERIOR APPROACH (Left) as a surgical intervention.  The patient's history has been reviewed, patient examined, no change in status, stable for surgery.  I have reviewed the patient's chart and labs.  Questions were answered to the patient's satisfaction.     Pilar Plate [name]

## 2021-07-23 NOTE — Anesthesia Procedure Notes (Signed)
Spinal  Start time: 07/23/2021 8:13 AM End time: 07/23/2021 8:17 AM Reason for block: surgical anesthesia Staffing Performed: resident/CRNA  Resident/CRNA: Victoriano Lain, CRNA Performed by: Victoriano Lain, CRNA Authorized by: Brennan Bailey, MD   Preanesthetic Checklist Completed: patient identified, IV checked, site marked, risks and benefits discussed, surgical consent, monitors and equipment checked, pre-op evaluation and timeout performed Spinal Block Patient position: sitting Prep: DuraPrep and site prepped and draped Patient monitoring: cardiac monitor, continuous pulse ox and blood pressure Approach: midline Location: L3-4 Injection technique: single-shot Needle Needle type: Pencan  Needle gauge: 24 G Needle length: 10 cm Assessment Events: CSF return Additional Notes Pt placed in sitting position for spinal placement. Spinal kit expiration date checked and verified. Sterile prep and drape of back. Local placed at injection site. One attempt. + clear CSF - heme. Pt tolerated well. Placed supine.

## 2021-07-23 NOTE — Evaluation (Signed)
Physical Therapy Evaluation Patient Details Name: Jonathan Holden MRN: [id number] DOB: 17-Sep-1945 Today's Date: 07/23/2021  History of Present Illness  Pt is a 76yo male presenting s/p L-THA, AA on 07/24/21. PMH: afib, CAD, hx of HI, HLD, HTN, L5 laminectomy.  Clinical Impression  Jonathan Holden is a 76 y.o. male POD 0 s/p L-THA, AA. Patient reports independence with mobility at baseline. Patient is now limited by functional impairments (see PT problem list below) and requires supervision for bed mobility, min guard for transfers and gait with RW. Patient was able to ambulate 3 feet with RW and min guard, pt reporting lightheadedness so transitioned to step pivot transfer to recliner, further mobility deferred (BP 102/71) but lightheadedness resolved with ~41mn seated rest. Bandage inspected and blood spot had enlarged from beginning to end of session, RN notified. Patient instructed in exercise to facilitate ROM and circulation to manage edema. Patient will benefit from continued skilled PT interventions to address impairments and progress towards PLOF. Acute PT will follow to progress mobility and stair training in preparation for safe discharge home.       Recommendations for follow up therapy are one component of a multi-disciplinary discharge planning process, led by the attending physician.  Recommendations may be updated based on patient status, additional functional criteria and insurance authorization.  Follow Up Recommendations Follow physician's recommendations for discharge plan and follow up therapies      Assistance Recommended at Discharge Intermittent Supervision/Assistance  Patient can return home with the following  A little help with walking and/or transfers;A little help with bathing/dressing/bathroom;Assistance with cooking/housework;Assist for transportation;Help with stairs or ramp for entrance    Equipment Recommendations Rolling walker (2 wheels) (Large RW (bari sized as  pt is 6'2"))  Recommendations for Other Services       Functional Status Assessment Patient has had a recent decline in their functional status and demonstrates the ability to make significant improvements in function in a reasonable and predictable amount of time.     Precautions / Restrictions Precautions Precautions: Fall Restrictions Weight Bearing Restrictions: No LLE Weight Bearing: Weight bearing as tolerated Other Position/Activity Restrictions: wbat      Mobility  Bed Mobility Overal bed mobility: Needs Assistance Bed Mobility: Supine to Sit     Supine to sit: Supervision     General bed mobility comments: For safety only, increased time, VCs for scooting EOB    Transfers Overall transfer level: Needs assistance Equipment used: Rolling walker (2 wheels) Transfers: Sit to/from Stand Sit to Stand: Min guard, From elevated surface           General transfer comment: Pt min guard for safety only, no physical assist required, multimodal cuing for using UE to power up from bed.    Ambulation/Gait Ambulation/Gait assistance: Min guard Gait Distance (Feet): 3 Feet Assistive device: Rolling walker (2 wheels) Gait Pattern/deviations: Step-to pattern, Decreased stance time - left, Decreased weight shift to left Gait velocity: decreased     General Gait Details: Pt able to stand statically on BLE, began ambulation in RW with min guard, VCs for sequencing. After 3 steps pt reporting lightheadedness so deferred to step pivot transfer to recliner, BP taken in sitting 102/71, feet up and seatback reclined. After ~547m seated rest pt reporting decreased lightheadedness.  Stairs            Wheelchair Mobility    Modified Rankin (Stroke Patients Only)       Balance Overall balance assessment: Needs assistance Sitting-balance support:  Feet supported, No upper extremity supported Sitting balance-Leahy Scale: Good     Standing balance support: No upper  extremity supported, Bilateral upper extremity supported, During functional activity Standing balance-Leahy Scale: Fair Standing balance comment: Pt able to perform static stance without any UE support. Pt reliant on BUE support on RW during mobility tasks.                             Pertinent Vitals/Pain Pain Assessment Pain Assessment: 0-10 Pain Score: 8  Pain Location: R hip Pain Descriptors / Indicators: Operative site guarding Pain Intervention(s): Limited activity within patient's tolerance, Monitored during session, Repositioned, Ice applied    Home Living Family/patient expects to be discharged to:: Private residence Living Arrangements: Alone Available Help at Discharge: Family;Available 24 hours/day (Brother Dan) Type of Home: House Home Access: Stairs to enter Entrance Stairs-Rails: None Entrance Stairs-Number of Steps: 1   Home Layout: One level Home Equipment: Conservation officer, nature (2 wheels);Shower seat;Cane - single point      Prior Function Prior Level of Function : Independent/Modified Independent             Mobility Comments: Using RW and SPC for practice during the last few days PTA ADLs Comments: ind     Hand Dominance   Dominant Hand: Right    Extremity/Trunk Assessment   Upper Extremity Assessment Upper Extremity Assessment: Overall WFL for tasks assessed    Lower Extremity Assessment Lower Extremity Assessment: RLE deficits/detail;LLE deficits/detail RLE Deficits / Details: MMT ank DF/PF 5/5 RLE Sensation: WNL LLE Deficits / Details: MMT ank DF/PF 5/5, some bruising L anterior thigh, 2 blood spots roughly the size of a quarter underneath surgical bandange prior to mobility LLE Sensation: WNL    Cervical / Trunk Assessment Cervical / Trunk Assessment: Back Surgery  Communication   Communication: No difficulties  Cognition Arousal/Alertness: Awake/alert Behavior During Therapy: WFL for tasks assessed/performed Overall Cognitive  Status: Within Functional Limits for tasks assessed                                          General Comments General comments (skin integrity, edema, etc.): At end of session, pt's blood spots under bandage were much larger, taking up the entire inferior surface of the bandange. RN notified.    Exercises Total Joint Exercises Ankle Circles/Pumps: AROM, Both, 20 reps   Assessment/Plan    PT Assessment Patient needs continued PT services  PT Problem List Decreased strength;Decreased range of motion;Decreased activity tolerance;Decreased balance;Decreased mobility;Decreased coordination;Pain       PT Treatment Interventions Gait training;Stair training;Functional mobility training;Therapeutic activities;Therapeutic exercise;Balance training;Neuromuscular re-education;Patient/family education;DME instruction    PT Goals (Current goals can be found in the Care Plan section)  Acute Rehab PT Goals Patient Stated Goal: Walk without pain PT Goal Formulation: With patient Time For Goal Achievement: 07/30/21 Potential to Achieve Goals: Good    Frequency 7X/week     Co-evaluation               AM-PAC PT "6 Clicks" Mobility  Outcome Measure Help needed turning from your back to your side while in a flat bed without using bedrails?: None Help needed moving from lying on your back to sitting on the side of a flat bed without using bedrails?: A Little Help needed moving to and from a bed to a chair (  including a wheelchair)?: A Little Help needed standing up from a chair using your arms (e.g., wheelchair or bedside chair)?: A Little Help needed to walk in hospital room?: A Little Help needed climbing 3-5 steps with a railing? : A Little 6 Click Score: 19    End of Session Equipment Utilized During Treatment: Gait belt Activity Tolerance: Patient tolerated treatment well;No increased pain Patient left: in chair;with call bell/phone within reach;with chair alarm  set;with family/visitor present Nurse Communication: Mobility status (Blood under bandage) PT Visit Diagnosis: Pain;Difficulty in walking, not elsewhere classified (R26.2) Pain - Right/Left: Left Pain - part of body: Hip    Time: 5003-7048 PT Time Calculation (min) (ACUTE ONLY): 35 min   Charges:   PT Evaluation $PT Eval Low Complexity: 1 Low PT Treatments $Therapeutic Activity: 8-22 mins        Coolidge Breeze, PT, DPT [facility] Department Office: [phone] Pager: [phone]  Coolidge Breeze 07/23/2021, 3:27 PM

## 2021-07-23 NOTE — Care Plan (Signed)
Ortho Bundle Case Management Note  Patient Details  Name: Jonathan Holden MRN: [id number] Date of Birth: [birth year]  L THA on 07-23-21 DCP:  Home with 24/7 caregiver DME:  No needs, has a RW PT:  HEP                   DME Arranged:  N/A DME Agency:  NA  HH Arranged:  NA HH Agency:  NA  Additional Comments: Please contact me with any questions of if this plan should need to change.  Marianne Sofia, RN,CCM [facility]  [phone] 07/23/2021, 9:04 AM

## 2021-07-23 NOTE — Op Note (Signed)
OPERATIVE REPORT- TOTAL HIP ARTHROPLASTY   PREOPERATIVE DIAGNOSIS: Osteoarthritis of the Left hip.   POSTOPERATIVE DIAGNOSIS: Osteoarthritis of the Left  hip.   PROCEDURE: Left total hip arthroplasty, anterior approach.   SURGEON: Gaynelle Arabian, MD   ASSISTANT: Jaynie Bream, PA-C  ANESTHESIA:  Spinal  ESTIMATED BLOOD LOSS:-350 mL    DRAINS: None  COMPLICATIONS: None   CONDITION: PACU - hemodynamically stable.   BRIEF CLINICAL NOTE: BRANDAN ROBICHEAUX is a 76 y.o. male who has advanced end-  stage arthritis of their Left  hip with progressively worsening pain and  dysfunction.The patient has failed nonoperative management and presents for  total hip arthroplasty.   PROCEDURE IN DETAIL: After successful administration of spinal  anesthetic, the traction boots for the [facility] bed were placed on both  feet and the patient was placed onto the [facility] bed, boots placed into the leg  holders. The Left hip was then isolated from the perineum with plastic  drapes and prepped and draped in the usual sterile fashion. ASIS and  greater trochanter were marked and a oblique incision was made, starting  at about 1 cm lateral and 2 cm distal to the ASIS and coursing towards  the anterior cortex of the femur. The skin was cut with a 10 blade  through subcutaneous tissue to the level of the fascia overlying the  tensor fascia lata muscle. The fascia was then incised in line with the  incision at the junction of the anterior third and posterior 2/3rd. The  muscle was teased off the fascia and then the interval between the TFL  and the rectus was developed. The Hohmann retractor was then placed at  the top of the femoral neck over the capsule. The vessels overlying the  capsule were cauterized and the fat on top of the capsule was removed.  A Hohmann retractor was then placed anterior underneath the rectus  femoris to give exposure to the entire anterior capsule. A T-shaped  capsulotomy  was performed. The edges were tagged and the femoral head  was identified.       Osteophytes are removed off the superior acetabulum.  The femoral neck was then cut in situ with an oscillating saw. Traction  was then applied to the left lower extremity utilizing the [facility]  traction. The femoral head was then removed. Retractors were placed  around the acetabulum and then circumferential removal of the labrum was  performed. Osteophytes were also removed. Reaming starts at 51 mm to  medialize and  Increased in 2 mm increments to 55 mm. We reamed in  approximately 40 degrees of abduction, 20 degrees anteversion. A 56 mm  pinnacle acetabular shell was then impacted in anatomic position under  fluoroscopic guidance with excellent purchase. We did not need to place  any additional dome screws. A 36 mm neutral + 4 marathon liner was then  placed into the acetabular shell.       The femoral lift was then placed along the lateral aspect of the femur  just distal to the vastus ridge. The leg was  externally rotated and capsule  was stripped off the inferior aspect of the femoral neck down to the  level of the lesser trochanter, this was done with electrocautery. The femur was lifted after this was performed. The  leg was then placed in an extended and adducted position essentially delivering the femur. We also removed the capsule superiorly and the piriformis from the piriformis fossa to  gain excellent exposure of the  proximal femur. Rongeur was used to remove some cancellous bone to get  into the lateral portion of the proximal femur for placement of the  initial starter reamer. The starter broaches was placed  the starter broach  and was shown to go down the center of the canal. Broaching  with the Actis system was then performed starting at size 0  coursing  Up to size 10. A size 10 had excellent torsional and rotational  and axial stability. The trial standard offset neck was then placed  with a  36 + 5 trial head. The hip was then reduced. We confirmed that  the stem was in the canal both on AP and lateral x-rays. It also has excellent sizing. The hip was reduced with outstanding stability through full extension and full external rotation.. AP pelvis was taken and the leg lengths were measured and found to be equal. Hip was then dislocated again and the femoral head and neck removed. The  femoral broach was removed. Size 10 Actis stem with a standard offset  neck was then impacted into the femur following native anteversion. Has  excellent purchase in the canal. Excellent torsional and rotational and  axial stability. It is confirmed to be in the canal on AP and lateral  fluoroscopic views. The 36 + 5 metal head was placed and the hip  reduced with outstanding stability. Again AP pelvis was taken and it  confirmed that the leg lengths were equal. The wound was then copiously  irrigated with saline solution and the capsule reattached and repaired  with Ethibond suture. 30 ml of .25% Bupivicaine was  injected into the capsule and into the edge of the tensor fascia lata as well as subcutaneous tissue. The fascia overlying the tensor fascia lata was then closed with a running #1 V-Loc. Subcu was closed with interrupted 2-0 Vicryl and subcuticular running 4-0 Monocryl. Incision was cleaned  and dried. Steri-Strips and a bulky sterile dressing applied. The patient was awakened and transported to  recovery in stable condition.        Please note that a surgical assistant was a medical necessity for this procedure to perform it in a safe and expeditious manner. Assistant was necessary to provide appropriate retraction of vital neurovascular structures and to prevent femoral fracture and allow for anatomic placement of the prosthesis.  Gaynelle Arabian, M.D.

## 2021-07-23 NOTE — Transfer of Care (Signed)
Immediate Anesthesia Transfer of Care Note  Patient: Jonathan Holden  Procedure(s) Performed: TOTAL HIP ARTHROPLASTY ANTERIOR APPROACH (Left: Hip)  Patient Location: PACU  Anesthesia Type:MAC and Spinal  Level of Consciousness: awake, alert , oriented and patient cooperative  Airway & Oxygen Therapy: Patient Spontanous Breathing and Patient connected to face mask oxygen  Post-op Assessment: Report given to RN and Post -op Vital signs reviewed and stable  Post vital signs: Reviewed and stable  Last Vitals:  Vitals Value Taken Time  BP 130/86 07/23/21 1005  Temp    Pulse 85 07/23/21 1006  Resp 33 07/23/21 1006  SpO2 100 % 07/23/21 1006  Vitals shown include unvalidated device data.  Last Pain:  Vitals:   07/23/21 0716  TempSrc: Oral  PainSc:          Complications: No notable events documented.

## 2021-07-24 ENCOUNTER — Encounter (HOSPITAL_COMMUNITY): Payer: Self-pay | Admitting: Orthopedic Surgery

## 2021-07-24 DIAGNOSIS — I509 Heart failure, unspecified: Secondary | ICD-10-CM | POA: Diagnosis not present

## 2021-07-24 DIAGNOSIS — I2510 Atherosclerotic heart disease of native coronary artery without angina pectoris: Secondary | ICD-10-CM | POA: Diagnosis not present

## 2021-07-24 DIAGNOSIS — Z87891 Personal history of nicotine dependence: Secondary | ICD-10-CM | POA: Diagnosis not present

## 2021-07-24 DIAGNOSIS — I252 Old myocardial infarction: Secondary | ICD-10-CM | POA: Diagnosis not present

## 2021-07-24 DIAGNOSIS — I110 Hypertensive heart disease with heart failure: Secondary | ICD-10-CM | POA: Diagnosis not present

## 2021-07-24 DIAGNOSIS — M1612 Unilateral primary osteoarthritis, left hip: Secondary | ICD-10-CM | POA: Diagnosis not present

## 2021-07-24 LAB — BASIC METABOLIC PANEL
Anion gap: 8 (ref 5–15)
BUN: 17 mg/dL (ref 8–23)
CO2: 26 mmol/L (ref 22–32)
Calcium: 8.8 mg/dL — ABNORMAL LOW (ref 8.9–10.3)
Chloride: 103 mmol/L (ref 98–111)
Creatinine, Ser: 0.87 mg/dL (ref 0.61–1.24)
GFR, Estimated: >60 mL/min (ref >60)
Glucose, Bld: 144 mg/dL — ABNORMAL HIGH (ref 70–99)
Potassium: 4.9 mmol/L (ref 3.5–5.1)
Sodium: 137 mmol/L (ref 135–145)

## 2021-07-24 LAB — CBC
HCT: 35.1 % — ABNORMAL LOW (ref 39.0–52.0)
Hemoglobin: 11.5 g/dL — ABNORMAL LOW (ref 13.0–17.0)
MCH: 32 pg (ref 26.0–34.0)
MCHC: 32.8 g/dL (ref 30.0–36.0)
MCV: 97.8 fL (ref 80.0–100.0)
Platelets: 197 10*3/uL (ref 150–400)
RBC: 3.59 MIL/uL — ABNORMAL LOW (ref 4.22–5.81)
RDW: 13.8 % (ref 11.5–15.5)
WBC: 13.2 10*3/uL — ABNORMAL HIGH (ref 4.0–10.5)
nRBC: 0 % (ref 0.0–0.2)

## 2021-07-24 MED ORDER — TRAMADOL HCL 50 MG PO TABS: 50.0000 mg | ORAL_TABLET | Freq: Four times a day (QID) | ORAL | 0 refills | Status: DC | PRN

## 2021-07-24 MED ORDER — ASPIRIN 325 MG PO TBEC: 325.0000 mg | DELAYED_RELEASE_TABLET | Freq: Two times a day (BID) | ORAL | 0 refills | Status: AC

## 2021-07-24 MED ORDER — METHOCARBAMOL 500 MG PO TABS: 500.0000 mg | ORAL_TABLET | Freq: Four times a day (QID) | ORAL | 0 refills | Status: DC | PRN

## 2021-07-24 MED ORDER — HYDROCODONE-ACETAMINOPHEN 5-325 MG PO TABS: 1.0000 | ORAL_TABLET | Freq: Four times a day (QID) | ORAL | 0 refills | Status: DC | PRN

## 2021-07-24 NOTE — TOC Transition Note (Signed)
Transition of Care [facility]) - CM/SW Discharge Note   Patient Details  Name: Jonathan Holden MRN: [id number] Date of Birth: [birth year]  Transition of Care [facility]) CM/SW Contact:  Lennart Pall, LCSW Phone Number: 07/24/2021, 9:49 AM   Clinical Narrative:    Met with pt and confirming he has all needed DME at home.  Plan for HEP.  No TOC needs.   Final next level of care: Home/Self Care Barriers to Discharge: No Barriers Identified   Patient Goals and CMS Choice Patient states their goals for this hospitalization and ongoing recovery are:: return home      Discharge Placement                       Discharge Plan and Services                DME Arranged: N/A DME Agency: NA       HH Arranged: NA HH Agency: NA        Social Determinants of Health (SDOH) Interventions     Readmission Risk Interventions     No data to display

## 2021-07-24 NOTE — Progress Notes (Signed)
   Subjective: 1 Day Post-Op Procedure(s) (LRB): TOTAL HIP ARTHROPLASTY ANTERIOR APPROACH (Left) Patient reports pain as mild.   Patient seen in rounds with Dr. Wynelle Link. Patient is well, and has had no acute complaints or problems. No issues overnight. Denies chest pain or Sob. Voiding without difficulty. We will continue therapy today.   Objective: Vital signs in last 24 hours: Temp:  [97.5 F (36.4 C)-98 F (36.7 C)] 97.7 F (36.5 C) (07/06 0542) Pulse Rate:  [64-86] 64 (07/06 0542) Resp:  [11-19] 18 (07/06 0542) BP: (122-139)/(74-87) 122/80 (07/06 0542) SpO2:  [93 %-100 %] 93 % (07/06 0542) Weight:  [95 kg] 95 kg (07/05 1330)  Intake/Output from previous day:  Intake/Output Summary (Last 24 hours) at 07/24/2021 0743 Last data filed at 07/24/2021 0600 Gross per 24 hour  Intake 4246.33 ml  Output 2670 ml  Net 1576.33 ml     Intake/Output this shift: No intake/output data recorded.  Labs: Recent Labs    07/24/21 0327  HGB 11.5*   Recent Labs    07/24/21 0327  WBC 13.2*  RBC 3.59*  HCT 35.1*  PLT 197   Recent Labs    07/24/21 0327  NA 137  K 4.9  CL 103  CO2 26  BUN 17  CREATININE 0.87  GLUCOSE 144*  CALCIUM 8.8*   No results for input(s): "LABPT", "INR" in the last 72 hours.  Exam: General - Patient is Alert and Oriented Extremity - Neurologically intact Neurovascular intact Sensation intact distally Dorsiflexion/Plantar flexion intact Dressing -  Aquacel with bloody drainage last night. Removed dressing down to incision, is not actively oozing. New aquacel applied. Motor Function - intact, moving foot and toes well on exam.   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   Weight as of this encounter: 95 kg. Advance diet Up with therapy D/C IV fluids  DVT Prophylaxis - Aspirin Weight bearing as tolerated. Continue therapy.  Plan is to go Home after hospital stay. Plan for discharge later today if progresses with therapy and meeting goals HEP Follow-up in 2 weeks  The PDMP database was reviewed today prior to any opioid medications being prescribed to this patient.  Theresa Duty, PA-C Orthopedic Surgery [phone] 07/24/2021, 7:43 AM

## 2021-07-24 NOTE — Progress Notes (Signed)
Physical Therapy Treatment Patient Details Name: Jonathan Holden MRN: [id number] DOB: 1945-11-26 Today's Date: 07/24/2021   History of Present Illness Pt is a 76yo male presenting s/p L-THA, AA on 07/24/21. PMH: afib, CAD, hx of HI, HLD, HTN, L5 laminectomy.    PT Comments    Pt assisted with ambulating in hallway.  Pt reporting dizziness so returned to room and pt able to safely sit in recliner.  However, shortly after, pt's eyes rolled back and UEs went into flexion pattern with pt jerking (briefly only approx 10 seconds), so emergently called nursing into room.      Recommendations for follow up therapy are one component of a multi-disciplinary discharge planning process, led by the attending physician.  Recommendations may be updated based on patient status, additional functional criteria and insurance authorization.  Follow Up Recommendations  Follow physician's recommendations for discharge plan and follow up therapies     Assistance Recommended at Discharge Intermittent Supervision/Assistance  Patient can return home with the following A little help with walking and/or transfers;A little help with bathing/dressing/bathroom;Assistance with cooking/housework;Assist for transportation;Help with stairs or ramp for entrance   Equipment Recommendations  Rolling walker (2 wheels)    Recommendations for Other Services       Precautions / Restrictions Precautions Precautions: Fall Restrictions LLE Weight Bearing: Weight bearing as tolerated     Mobility  Bed Mobility Overal bed mobility: Needs Assistance Bed Mobility: Supine to Sit     Supine to sit: Supervision          Transfers Overall transfer level: Needs assistance Equipment used: Rolling walker (2 wheels) Transfers: Sit to/from Stand Sit to Stand: Min guard, From elevated surface           General transfer comment: verbal cues for UE and LE positioning    Ambulation/Gait Ambulation/Gait assistance: Min  guard Gait Distance (Feet): 60 Feet Assistive device: Rolling walker (2 wheels) Gait Pattern/deviations: Step-to pattern, Decreased stance time - left, Decreased weight shift to left Gait velocity: decreased     General Gait Details: verbal cues for sequence, RW positioning, posture; pt with lightheadness upon returning to room and able to safely be seated in recliner   Stairs             Wheelchair Mobility    Modified Rankin (Stroke Patients Only)       Balance                                            Cognition Arousal/Alertness: Awake/alert Behavior During Therapy: WFL for tasks assessed/performed Overall Cognitive Status: Within Functional Limits for tasks assessed                                          Exercises      General Comments        Pertinent Vitals/Pain Pain Assessment Pain Assessment: 0-10 Pain Score: 5  Pain Location: R hip Pain Descriptors / Indicators: Operative site guarding, Sore Pain Intervention(s): Repositioned, Monitored during session    Home Living                          Prior Function            PT Goals (current  goals can now be found in the care plan section) Progress towards PT goals: Progressing toward goals    Frequency    7X/week      PT Plan Current plan remains appropriate    Co-evaluation              AM-PAC PT "6 Clicks" Mobility   Outcome Measure  Help needed turning from your back to your side while in a flat bed without using bedrails?: None Help needed moving from lying on your back to sitting on the side of a flat bed without using bedrails?: A Little Help needed moving to and from a bed to a chair (including a wheelchair)?: A Little Help needed standing up from a chair using your arms (e.g., wheelchair or bedside chair)?: A Little Help needed to walk in hospital room?: A Little Help needed climbing 3-5 steps with a railing? : A Lot 6  Click Score: 18    End of Session Equipment Utilized During Treatment: Gait belt Activity Tolerance: Treatment limited secondary to medical complications (Comment) (drop in BP with activity) Patient left: in chair;with call bell/phone within reach;with chair alarm set;with nursing/sitter in room Nurse Communication: Mobility status PT Visit Diagnosis: Difficulty in walking, not elsewhere classified (R26.2)     Time: 9597-4718 PT Time Calculation (min) (ACUTE ONLY): 21 min  Charges:  $Gait Training: 8-22 mins                     Arlyce Dice, DPT Physical Therapist Acute Rehabilitation Services Preferred contact method: Secure Chat Weekend Pager Only: [phone] Office: [place] 07/24/2021, 11:40 AM

## 2021-07-25 DIAGNOSIS — I252 Old myocardial infarction: Secondary | ICD-10-CM | POA: Diagnosis not present

## 2021-07-25 DIAGNOSIS — I2510 Atherosclerotic heart disease of native coronary artery without angina pectoris: Secondary | ICD-10-CM | POA: Diagnosis not present

## 2021-07-25 DIAGNOSIS — M1612 Unilateral primary osteoarthritis, left hip: Secondary | ICD-10-CM | POA: Diagnosis not present

## 2021-07-25 DIAGNOSIS — Z87891 Personal history of nicotine dependence: Secondary | ICD-10-CM | POA: Diagnosis not present

## 2021-07-25 DIAGNOSIS — I509 Heart failure, unspecified: Secondary | ICD-10-CM | POA: Diagnosis not present

## 2021-07-25 DIAGNOSIS — I110 Hypertensive heart disease with heart failure: Secondary | ICD-10-CM | POA: Diagnosis not present

## 2021-07-25 LAB — CBC
HCT: 34.6 % — ABNORMAL LOW (ref 39.0–52.0)
Hemoglobin: 11.5 g/dL — ABNORMAL LOW (ref 13.0–17.0)
MCH: 32.4 pg (ref 26.0–34.0)
MCHC: 33.2 g/dL (ref 30.0–36.0)
MCV: 97.5 fL (ref 80.0–100.0)
Platelets: 207 10*3/uL (ref 150–400)
RBC: 3.55 MIL/uL — ABNORMAL LOW (ref 4.22–5.81)
RDW: 14.1 % (ref 11.5–15.5)
WBC: 13.6 10*3/uL — ABNORMAL HIGH (ref 4.0–10.5)
nRBC: 0 % (ref 0.0–0.2)

## 2021-07-25 MED ORDER — SODIUM CHLORIDE 0.9 % IV BOLUS
500.0000 mL | Freq: Once | INTRAVENOUS | Status: AC
  Administered 2021-07-25: 500 mL via INTRAVENOUS

## 2021-07-25 NOTE — Progress Notes (Signed)
   Subjective: 2 Days Post-Op Procedure(s) (LRB): TOTAL HIP ARTHROPLASTY ANTERIOR APPROACH (Left) Patient seen in rounds by Dr. Wynelle Link. Patient is well. Had one vasovagal episode yesterday morning with PT but is feeling better. Denies SOB or chest pain. Denies dizziness or lightheadedness. Voiding without difficulty. Patient reports pain as mild. Plan is to go Home after hospital stay.  Objective: Vital signs in last 24 hours: Temp:  [97.6 F (36.4 C)-98 F (36.7 C)] 98 F (36.7 C) (07/07 0516) Pulse Rate:  [65-79] 70 (07/07 0516) Resp:  [17-20] 18 (07/07 0516) BP: (97-136)/(58-82) 119/75 (07/07 0516) SpO2:  [94 %-98 %] 96 % (07/07 0516)  Intake/Output from previous day:  Intake/Output Summary (Last 24 hours) at 07/25/2021 0704 Last data filed at 07/25/2021 0600 Gross per 24 hour  Intake 1216.17 ml  Output 2475 ml  Net -1258.83 ml    Intake/Output this shift: No intake/output data recorded.  Labs: Recent Labs    07/24/21 0327 07/25/21 0347  HGB 11.5* 11.5*   Recent Labs    07/24/21 0327 07/25/21 0347  WBC 13.2* 13.6*  RBC 3.59* 3.55*  HCT 35.1* 34.6*  PLT 197 207   Recent Labs    07/24/21 0327  NA 137  K 4.9  CL 103  CO2 26  BUN 17  CREATININE 0.87  GLUCOSE 144*  CALCIUM 8.8*   No results for input(s): "LABPT", "INR" in the last 72 hours.  Exam: General - Patient is Alert and Oriented Extremity - Neurologically intact Neurovascular intact Sensation intact distally Dorsiflexion/Plantar flexion intact Dressing/Incision - clean, dry, no drainage Motor Function - intact, moving foot and toes well on exam.  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   Weight as of this encounter: 95 kg.   DVT Prophylaxis - Aspirin Weight-bearing as tolerated.  Continue with physical therapy today. If meeting goals and no vasovagal episodes, able to discharge later today. Will do HEP. Follow-up in clinic in 2 weeks.  Rainey Pines, PA-C Orthopedic Surgery [phone] 07/25/2021, 7:04 AM

## 2021-07-25 NOTE — Plan of Care (Signed)
  Problem: Education: Goal: Knowledge of General Education information will improve Description Including pain rating scale, medication(s)/side effects and non-pharmacologic comfort measures Outcome: Progressing   Problem: Health Behavior/Discharge Planning: Goal: Ability to manage health-related needs will improve Outcome: Progressing   

## 2021-07-25 NOTE — Progress Notes (Signed)
Physical Therapy Treatment Patient Details Name: Jonathan Holden MRN: [id number] DOB: 05/28/1945 Today's Date: 07/25/2021   History of Present Illness Pt is a 76yo male presenting s/p L-THA, AA on 07/24/21. PMH: afib, CAD, hx of HI, HLD, HTN, L5 laminectomy.    PT Comments    Pt reports feeling well this morning and ambulated in hallway.  Pt denied dizziness so performed a few standing exercises however pt reports dizziness upon returning to recliner.  BP 70/53 mmHg and HR 69 bpm.  Pt reclined and LEs elevated and BP 99/62 mmHg a few minutes later.  Used call bell for Network engineer to notify RN of pt's BP.  Pt reported feeling better after being reclined and encouraged fluid intake.     Recommendations for follow up therapy are one component of a multi-disciplinary discharge planning process, led by the attending physician.  Recommendations may be updated based on patient status, additional functional criteria and insurance authorization.  Follow Up Recommendations  Follow physician's recommendations for discharge plan and follow up therapies     Assistance Recommended at Discharge Intermittent Supervision/Assistance  Patient can return home with the following A little help with walking and/or transfers;A little help with bathing/dressing/bathroom;Assistance with cooking/housework;Assist for transportation;Help with stairs or ramp for entrance   Equipment Recommendations  Rolling walker (2 wheels)    Recommendations for Other Services       Precautions / Restrictions Precautions Precautions: Fall Restrictions Weight Bearing Restrictions: No LLE Weight Bearing: Weight bearing as tolerated     Mobility  Bed Mobility Overal bed mobility: Needs Assistance Bed Mobility: Supine to Sit     Supine to sit: Min guard, Supervision     General bed mobility comments: increased time, pt able to self assist Lt LE    Transfers Overall transfer level: Needs assistance Equipment used:  Rolling walker (2 wheels) Transfers: Sit to/from Stand Sit to Stand: Min guard, From elevated surface           General transfer comment: verbal cues for UE and LE positioning    Ambulation/Gait Ambulation/Gait assistance: Min guard Gait Distance (Feet): 120 Feet Assistive device: Rolling walker (2 wheels) Gait Pattern/deviations: Step-to pattern, Decreased stance time - left, Decreased weight shift to left Gait velocity: decreased     General Gait Details: verbal cues for sequence, RW positioning, posture; no dizziness with ambulation   Stairs             Wheelchair Mobility    Modified Rankin (Stroke Patients Only)       Balance                                            Cognition Arousal/Alertness: Awake/alert Behavior During Therapy: WFL for tasks assessed/performed Overall Cognitive Status: Within Functional Limits for tasks assessed                                          Exercises Total Joint Exercises Ankle Circles/Pumps: AROM, Both, 10 reps Quad Sets: AROM, Left, 10 reps Short Arc Quad: AROM, Left, 10 reps Heel Slides: AAROM, Left, 10 reps Hip ABduction/ADduction: AROM, Left, 10 reps, Standing Long Arc Quad: AROM, Left, Seated, 10 reps Knee Flexion: AROM, Left, Standing, 10 reps Marching in Standing: AROM, Left, 10 reps Standing Hip Extension:  AROM, Left, 10 reps    General Comments        Pertinent Vitals/Pain Pain Assessment Pain Assessment: 0-10 Pain Score: 3  Pain Location: R hip Pain Descriptors / Indicators: Operative site guarding, Sore Pain Intervention(s): Monitored during session, Repositioned    Home Living                          Prior Function            PT Goals (current goals can now be found in the care plan section) Progress towards PT goals: Progressing toward goals    Frequency    7X/week      PT Plan Current plan remains appropriate    Co-evaluation               AM-PAC PT "6 Clicks" Mobility   Outcome Measure  Help needed turning from your back to your side while in a flat bed without using bedrails?: None Help needed moving from lying on your back to sitting on the side of a flat bed without using bedrails?: A Little Help needed moving to and from a bed to a chair (including a wheelchair)?: A Little Help needed standing up from a chair using your arms (e.g., wheelchair or bedside chair)?: A Little Help needed to walk in hospital room?: A Little Help needed climbing 3-5 steps with a railing? : A Lot 6 Click Score: 18    End of Session Equipment Utilized During Treatment: Gait belt Activity Tolerance: Treatment limited secondary to medical complications (Comment) (drop in BP) Patient left: in chair;with call bell/phone within reach Nurse Communication: Mobility status PT Visit Diagnosis: Difficulty in walking, not elsewhere classified (R26.2)     Time: 6834-1962 PT Time Calculation (min) (ACUTE ONLY): 26 min  Charges:  $Gait Training: 8-22 mins $Therapeutic Exercise: 8-22 mins                    Jannette Spanner PT, DPT Physical Therapist Acute Rehabilitation Services Preferred contact method: Secure Chat Weekend Pager Only: [phone] Office: [place] 07/25/2021, 12:12 PM

## 2021-07-25 NOTE — Progress Notes (Signed)
Physical Therapy Treatment Patient Details Name: Jonathan Holden MRN: [id number] DOB: 1945-12-10 Today's Date: 07/25/2021   History of Present Illness Pt is a 76yo male presenting s/p L-THA, AA on 07/24/21. PMH: afib, CAD, hx of HI, HLD, HTN, L5 laminectomy.    PT Comments    Pt back in bed on arrival.  Noticed pt not wearing TED hose so applied TED hose prior to mobility this afternoon and educated pt that TED hose during the day may help with his BP/dizziness upon d/c as well.  Pt ambulated and practiced one step and denied any dizziness.  Pt provided with HEP which he visually reviewed and had no further questions.  Pt feels ready for d/c home with brother today.    Recommendations for follow up therapy are one component of a multi-disciplinary discharge planning process, led by the attending physician.  Recommendations may be updated based on patient status, additional functional criteria and insurance authorization.  Follow Up Recommendations  Follow physician's recommendations for discharge plan and follow up therapies     Assistance Recommended at Discharge Intermittent Supervision/Assistance  Patient can return home with the following A little help with walking and/or transfers;A little help with bathing/dressing/bathroom;Assistance with cooking/housework;Assist for transportation;Help with stairs or ramp for entrance   Equipment Recommendations  Rolling walker (2 wheels)    Recommendations for Other Services       Precautions / Restrictions Precautions Precautions: Fall Restrictions LLE Weight Bearing: Weight bearing as tolerated     Mobility  Bed Mobility Overal bed mobility: Needs Assistance Bed Mobility: Sit to Supine     Supine to sit: Min guard, Supervision Sit to supine: Supervision   General bed mobility comments: increased time, pt able to self assist Lt LE    Transfers Overall transfer level: Needs assistance Equipment used: Rolling walker (2  wheels) Transfers: Sit to/from Stand Sit to Stand: Min guard, From elevated surface           General transfer comment: verbal cues for UE and LE positioning    Ambulation/Gait Ambulation/Gait assistance: Min guard Gait Distance (Feet): 120 Feet Assistive device: Rolling walker (2 wheels) Gait Pattern/deviations: Step-to pattern, Decreased stance time - left, Antalgic Gait velocity: decreased     General Gait Details: verbal cues for sequence, RW positioning, posture; no dizziness with ambulation   Stairs Stairs: Yes Stairs assistance: Min guard Stair Management: Step to pattern, Forwards, Backwards, With walker Number of Stairs: 1 General stair comments: performed one step both forwards and then backwards with RW, pt reports understanding   Wheelchair Mobility    Modified Rankin (Stroke Patients Only)       Balance                                            Cognition Arousal/Alertness: Awake/alert Behavior During Therapy: WFL for tasks assessed/performed Overall Cognitive Status: Within Functional Limits for tasks assessed                                          Exercises    General Comments        Pertinent Vitals/Pain Pain Assessment Pain Assessment: 0-10 Pain Score: 4  Pain Location: R hip Pain Descriptors / Indicators: Sore, Aching Pain Intervention(s): Repositioned, Monitored during session  Home Living                          Prior Function            PT Goals (current goals can now be found in the care plan section) Progress towards PT goals: Progressing toward goals    Frequency    7X/week      PT Plan Current plan remains appropriate    Co-evaluation              AM-PAC PT "6 Clicks" Mobility   Outcome Measure  Help needed turning from your back to your side while in a flat bed without using bedrails?: None Help needed moving from lying on your back to sitting on  the side of a flat bed without using bedrails?: A Little Help needed moving to and from a bed to a chair (including a wheelchair)?: A Little Help needed standing up from a chair using your arms (e.g., wheelchair or bedside chair)?: A Little Help needed to walk in hospital room?: A Little Help needed climbing 3-5 steps with a railing? : A Little 6 Click Score: 19    End of Session Equipment Utilized During Treatment: Gait belt Activity Tolerance: Patient tolerated treatment well Patient left: in bed;with call bell/phone within reach;with bed alarm set Nurse Communication: Mobility status PT Visit Diagnosis: Difficulty in walking, not elsewhere classified (R26.2)     Time: 1660-6004 PT Time Calculation (min) (ACUTE ONLY): 23 min  Charges:  $Gait Training: 23-37 mins                     Jannette Spanner PT, DPT Physical Therapist Acute Rehabilitation Services Preferred contact method: Secure Chat Weekend Pager Only: [phone] Office: [place] 07/25/2021, 3:44 PM

## 2021-07-25 NOTE — Progress Notes (Signed)
Physical Therapy Treatment Patient Details Name: CASON LUFFMAN MRN: [id number] DOB: [birth year] Today's Date: 07/25/2021   History of Present Illness Pt is a 76yo male presenting s/p L-THA, AA on 07/24/21. PMH: afib, CAD, hx of HI, HLD, HTN, L5 laminectomy.    PT Comments    Pt ambulated in hallway and performed LE exercises.  [name] present for session.  Pt denied any dizziness this afternoon.    Recommendations for follow up therapy are one component of a multi-disciplinary discharge planning process, led by the attending physician.  Recommendations may be updated based on patient status, additional functional criteria and insurance authorization.  Follow Up Recommendations  Follow physician's recommendations for discharge plan and follow up therapies     Assistance Recommended at Discharge Intermittent Supervision/Assistance  Patient can return home with the following A little help with walking and/or transfers;A little help with bathing/dressing/bathroom;Assistance with cooking/housework;Assist for transportation;Help with stairs or ramp for entrance   Equipment Recommendations  Rolling walker (2 wheels)    Recommendations for Other Services       Precautions / Restrictions Precautions Precautions: Fall Restrictions Weight Bearing Restrictions: No LLE Weight Bearing: Weight bearing as tolerated     Mobility  Bed Mobility               General bed mobility comments: pt in recliner    Transfers Overall transfer level: Needs assistance   Transfers: Sit to/from Stand Sit to Stand: Min guard, From elevated surface           General transfer comment: verbal cues for UE and LE positioning    Ambulation/Gait Ambulation/Gait assistance: Min guard Gait Distance (Feet): 100 Feet Assistive device: Rolling walker (2 wheels) Gait Pattern/deviations: Step-to pattern, Decreased stance time - left, Decreased weight shift to left Gait velocity: decreased      General Gait Details: verbal cues for sequence, RW positioning, posture; pt denies dizziness this afternoon   Stairs             Wheelchair Mobility    Modified Rankin (Stroke Patients Only)       Balance                                            Cognition Arousal/Alertness: Awake/alert Behavior During Therapy: WFL for tasks assessed/performed Overall Cognitive Status: Within Functional Limits for tasks assessed                                          Exercises Total Joint Exercises Ankle Circles/Pumps: AROM, Both, 10 reps Quad Sets: AROM, Left, 10 reps Short Arc Quad: AROM, Left, 10 reps Heel Slides: AAROM, Left, 10 reps Hip ABduction/ADduction: AROM, Left, 10 reps Long Arc Quad: AROM, Left, Seated, 10 reps    General Comments        Pertinent Vitals/Pain Pain Assessment Pain Assessment: 0-10 Pain Score: 5  Pain Location: R hip Pain Descriptors / Indicators: Operative site guarding, Sore Pain Intervention(s): Monitored during session, Repositioned    Home Living                          Prior Function            PT Goals (current goals can now be  found in the care plan section) Progress towards PT goals: Progressing toward goals    Frequency    7X/week      PT Plan Current plan remains appropriate    Co-evaluation              AM-PAC PT "6 Clicks" Mobility   Outcome Measure  Help needed turning from your back to your side while in a flat bed without using bedrails?: None Help needed moving from lying on your back to sitting on the side of a flat bed without using bedrails?: A Little Help needed moving to and from a bed to a chair (including a wheelchair)?: A Little Help needed standing up from a chair using your arms (e.g., wheelchair or bedside chair)?: A Little Help needed to walk in hospital room?: A Little Help needed climbing 3-5 steps with a railing? : A Lot 6 Click Score:  18    End of Session Equipment Utilized During Treatment: Gait belt Activity Tolerance: Patient tolerated treatment well Patient left: in chair;with call bell/phone within reach;with family/visitor present Nurse Communication: Mobility status PT Visit Diagnosis: Difficulty in walking, not elsewhere classified (R26.2)     Time: 4562-5638 PT Time Calculation (min) (ACUTE ONLY): 25 min  Charges:    1 Gt, 1 TE                   Kati PT, DPT Physical Therapist Acute Rehabilitation Services Preferred contact method: Secure Chat Weekend Pager Only: [phone] Office: [place] 07/25/2021, 12:04 PM

## 2021-07-25 NOTE — Progress Notes (Signed)
The patient is alert and oriented and has been seen by his physician. The orders for discharge were written. Patient requested a suppository before being discharged. Patient started passing some gas, but no BM yet. Patient received instructions in the discharge instructions about constipation regimen and the need to increase fluid intake. IV has been removed. Went over discharge instructions with patient. He is being discharged via wheelchair with all of his belongings.

## 2021-07-30 NOTE — Discharge Summary (Signed)
Physician Discharge Summary   Patient ID: Jonathan Holden MRN: [id number] DOB/AGE: 05/14/1945 76 y.o.  Admit date: 07/23/2021 Discharge date: 07/25/2021  Primary Diagnosis: Osteoarthritis of the left hip.    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   Weight as of this encounter: 95 kg.  Procedure:  Procedure(s) (LRB): TOTAL HIP ARTHROPLASTY ANTERIOR APPROACH (Left)   Consults: None  HPI: Jonathan Holden is a 76 y.o. male who has advanced end-  stage arthritis of their Left  hip with progressively worsening pain and dysfunction.The patient has failed nonoperative management and presents for total hip arthroplasty.   Laboratory Data: Admission on 07/23/2021, Discharged on 07/25/2021  Component Date Value Ref Range Status   WBC 07/24/2021 13.2 (H)  4.0 - 10.5 K/uL Final   RBC 07/24/2021 3.59 (L)  4.22 - 5.81 MIL/uL Final   Hemoglobin 07/24/2021 11.5 (L)  13.0 - 17.0 g/dL Final   HCT 07/24/2021 35.1 (L)  39.0 - 52.0 % Final   MCV  07/24/2021 97.8  80.0 - 100.0 fL Final   MCH 07/24/2021 32.0  26.0 - 34.0 pg Final   MCHC 07/24/2021 32.8  30.0 - 36.0 g/dL Final   RDW 07/24/2021 13.8  11.5 - 15.5 % Final   Platelets 07/24/2021 197  150 - 400 K/uL Final   nRBC 07/24/2021 0.0  0.0 - 0.2 % Final   Performed at [facility], [place] [address], [place], [place] [address]   Sodium 07/24/2021 137  135 - 145 mmol/L Final   Potassium 07/24/2021 4.9  3.5 - 5.1 mmol/L Final   Chloride 07/24/2021 103  98 - 111 mmol/L Final   CO2 07/24/2021 26  22 - 32 mmol/L Final   Glucose, Bld 07/24/2021 144 (H)  70 - 99 mg/dL Final   Glucose reference range applies only to samples taken after fasting for at least 8 hours.   BUN 07/24/2021 17  8 - 23 mg/dL Final   Creatinine, Ser 07/24/2021 0.87  0.61 - 1.24 mg/dL Final   Calcium 07/24/2021 8.8 (L)  8.9 - 10.3 mg/dL Final   GFR, Estimated 07/24/2021 >60  >60 mL/min Final   Comment: (NOTE) Calculated using the CKD-EPI Creatinine Equation (2021)    Anion gap 07/24/2021 8  5 - 15 Final   Performed at [facility], [place] [address] [place], [place] [address]   WBC 07/25/2021 13.6 (H)  4.0 -  10.5 K/uL Final   RBC 07/25/2021 3.55 (L)  4.22 - 5.81 MIL/uL Final   Hemoglobin 07/25/2021 11.5 (L)  13.0 - 17.0 g/dL Final   HCT 07/25/2021 34.6 (L)  39.0 - 52.0 % Final   MCV 07/25/2021 97.5  80.0 - 100.0 fL Final   MCH 07/25/2021 32.4  26.0 - 34.0 pg Final   MCHC 07/25/2021 33.2  30.0 - 36.0 g/dL Final   RDW 07/25/2021 14.1  11.5 - 15.5 % Final   Platelets 07/25/2021 207  150 - 400 K/uL Final   nRBC 07/25/2021 0.0  0.0 - 0.2 % Final   Performed at [facility], [place] [address], [place], [place] [address]  Hospital Outpatient Visit on 07/15/2021  Component Date Value Ref Range Status   MRSA, PCR 07/15/2021 NEGATIVE  NEGATIVE Final   Staphylococcus aureus 07/15/2021 POSITIVE (A)  NEGATIVE Final   Comment: (NOTE) The Xpert SA Assay (FDA approved for  NASAL specimens in patients 59 years of age and older), is one component of a comprehensive surveillance program. It is not intended to diagnose infection nor to guide or monitor treatment. Performed at [facility], [place] [address], [place], [place] [address]    WBC 07/15/2021 7.3  4.0 - 10.5 K/uL Final   RBC 07/15/2021 4.16 (L)  4.22 - 5.81 MIL/uL Final   Hemoglobin 07/15/2021 13.6  13.0 - 17.0 g/dL Final   HCT 07/15/2021 40.6  39.0 - 52.0 % Final   MCV 07/15/2021 97.6  80.0 - 100.0 fL Final   MCH 07/15/2021 32.7  26.0 - 34.0 pg Final   MCHC 07/15/2021 33.5  30.0 - 36.0 g/dL Final   RDW 07/15/2021 14.9  11.5 - 15.5 % Final   Platelets 07/15/2021 173  150 - 400 K/uL Final   nRBC 07/15/2021 0.0  0.0 - 0.2 % Final   Performed at [facility], [place] [address], [place], [place] [address]   Sodium 07/15/2021 139  135 - 145 mmol/L Final   Potassium 07/15/2021 4.6  3.5 - 5.1 mmol/L Final   Chloride 07/15/2021 107  98 - 111 mmol/L Final   CO2 07/15/2021 25  22 - 32 mmol/L Final   Glucose, Bld 07/15/2021 102 (H)  70 - 99 mg/dL Final   Glucose reference range applies only to samples taken after fasting for at least 8 hours.   BUN 07/15/2021 19  8 - 23 mg/dL Final   Creatinine, Ser 07/15/2021 0.88  0.61 - 1.24 mg/dL Final   Calcium 07/15/2021 9.5  8.9 - 10.3 mg/dL Final   GFR, Estimated 07/15/2021 >60  >60 mL/min Final   Comment: (NOTE) Calculated using the CKD-EPI Creatinine Equation (2021)    Anion gap 07/15/2021 7  5 - 15 Final   Performed at [facility], [place] [address] [place], [place] [address]   ABO/RH(D) 07/15/2021 B POS   Final   Antibody Screen 07/15/2021 NEG   Final   Sample Expiration 07/15/2021 07/26/2021,2359   Final   Extend sample reason 07/15/2021    Final                   Value:NO TRANSFUSIONS OR PREGNANCY IN THE PAST 3 MONTHS Performed at [place] [address] [place], [place]  [address]      X-Rays:DG Pelvis Portable  Result Date: 07/23/2021 CLINICAL DATA:  [id number] status post left hip replacement EXAM: PORTABLE PELVIS 1-2 VIEWS COMPARISON:  None Available. FINDINGS:  There are post total left hip arthroplasty changes seen. The arthroplasty components are in near anatomical alignment. Moderate degenerative changes of the right hip joint with some narrowing of the right hip joint space. Vascular calcifications. IMPRESSION: Status post left hip total arthroplasty and the arthroplasty components are in near anatomical alignment. Soft tissue emphysema. Electronically Signed   By: Frazier Richards M.D.   On: 07/23/2021 10:32   DG HIP UNILAT WITH PELVIS 1V LEFT  Result Date: 07/23/2021 CLINICAL DATA:  Left hip replacement EXAM: DG HIP (WITH OR WITHOUT PELVIS) 1V*L* COMPARISON:  None Available. FINDINGS: Fluoroscopic images were obtained intraoperatively and submitted for post operative interpretation. Left total hip arthroplasty with hardware in expected position, due images were obtained with 7 seconds of fluoroscopy time and 1.4 mGy. Please see the performing provider's procedural report for further detail. IMPRESSION: Fluoroscopic images demonstrate left total hip replacement. Electronically Signed   By: Yetta Glassman M.D.   On: 07/23/2021 09:54   DG C-Arm 1-60 Min-No Report  Result Date: 07/23/2021 Fluoroscopy was utilized by the requesting physician.  No radiographic interpretation.   DG C-Arm 1-60 Min-No Report  Result Date: 07/23/2021 Fluoroscopy was utilized by the requesting physician.  No radiographic interpretation.    EKG: Orders placed or performed in visit on 02/04/21   EKG 12-Lead     Hospital Course: Jonathan Holden is a 76 y.o. who was admitted to [facility]. They were brought to the operating room on 07/23/2021 and underwent Procedure(s): [place].  Patient tolerated the procedure well and was later transferred to the  recovery room and then to the orthopaedic floor for postoperative care. They were given PO and IV analgesics for pain control following their surgery. They were given 24 hours of postoperative antibiotics of  Anti-infectives (From admission, onward)    Start     Dose/Rate Route Frequency Ordered Stop   07/24/21 1000  valACYclovir (VALTREX) tablet 1,000 mg  Status:  Discontinued        1,000 mg Oral Daily 07/23/21 1136 07/25/21 2323   07/23/21 1400  ceFAZolin (ANCEF) IVPB 2g/100 mL premix        2 g 200 mL/hr over 30 Minutes Intravenous Every 6 hours 07/23/21 1136 07/23/21 2000   07/23/21 0615  ceFAZolin (ANCEF) IVPB 2g/100 mL premix        2 g 200 mL/hr over 30 Minutes Intravenous On call to O.R. 07/23/21 1610 07/23/21 0848     and started on DVT prophylaxis in the form of Aspirin.   PT and OT were ordered for total joint protocol. Discharge planning consulted to help with postop disposition and equipment needs. Patient had a fair night on the evening of surgery. They started to get up OOB with therapy on POD #0. Had one vasovagal episode on POD #1 while working with physical therapy. Asymptomatic after episode. Continued to work with therapy into POD #2. Patient was seen during rounds on day two and was ready to go home pending progress with therapy. Patient worked with therapy for 3 additional sessions and was meeting their goals. Dressing was changed and the incision was C/D/I.  They were discharged home later that day in stable condition.  Diet: Regular diet Activity: WBAT Follow-up: in 2 weeks Disposition: Home Discharged Condition: stable   Discharge Instructions     Call MD / Call 911   Complete by: As directed    If you experience chest pain or shortness of breath, CALL  911 and be transported to the hospital emergency room.  If you develope a fever above 101 F, pus (white drainage) or increased drainage or redness at the wound, or calf pain, call your surgeon's office.   Change  dressing   Complete by: As directed    You have an adhesive waterproof bandage over the incision. Leave this in place until your first follow-up appointment. Once you remove this you will not need to place another bandage.   Constipation Prevention   Complete by: As directed    Drink plenty of fluids.  Prune juice may be helpful.  You may use a stool softener, such as Colace (over the counter) 100 mg twice a day.  Use MiraLax (over the counter) for constipation as needed.   Diet - low sodium heart healthy   Complete by: As directed    Do not sit on low chairs, stoools or toilet seats, as it may be difficult to get up from low surfaces   Complete by: As directed    Driving restrictions   Complete by: As directed    No driving for two weeks   Post-operative opioid taper instructions:   Complete by: As directed    POST-OPERATIVE OPIOID TAPER INSTRUCTIONS: It is important to wean off of your opioid medication as soon as possible. If you do not need pain medication after your surgery it is ok to stop day one. Opioids include: Codeine, Hydrocodone(Norco, Vicodin), Oxycodone(Percocet, oxycontin) and hydromorphone amongst others.  Long term and even short term use of opiods can cause: Increased pain response Dependence Constipation Depression Respiratory depression And more.  Withdrawal symptoms can include Flu like symptoms Nausea, vomiting And more Techniques to manage these symptoms Hydrate well Eat regular healthy meals Stay active Use relaxation techniques(deep breathing, meditating, yoga) Do Not substitute Alcohol to help with tapering If you have been on opioids for less than two weeks and do not have pain than it is ok to stop all together.  Plan to wean off of opioids This plan should start within one week post op of your joint replacement. Maintain the same interval or time between taking each dose and first decrease the dose.  Cut the total daily intake of opioids by one  tablet each day Next start to increase the time between doses. The last dose that should be eliminated is the evening dose.      TED hose   Complete by: As directed    Use stockings (TED hose) for three weeks on both leg(s).  You may remove them at night for sleeping.   Weight bearing as tolerated   Complete by: As directed       Allergies as of 07/25/2021       Reactions   Eliquis [apixaban] Other (See Comments)   Recurrent GI bleeding occurred from diverticulosis in the setting of anticoagulation therapy for atrial fibrillation.   Food Other (See Comments)   Pt is allergic to melons.   Reaction:  Unknown    Glutamic Acid Other (See Comments)   Pt is allergic to melons.   Reaction:  Unknown    Grass Pollen(k-o-r-t-swt Vern)    Sneezing    Ciprofloxacin Hives   Pollen Extract Other (See Comments)   Sneezing         Medication List     TAKE these medications    amiodarone 200 MG tablet Commonly known as: PACERONE TAKE 1 TABLET (200 MG TOTAL) BY MOUTH DAILY. \   aspirin  EC 325 MG tablet Take 1 tablet (325 mg total) by mouth 2 (two) times daily for 20 days. Then take one 81 mg aspirin once a day for three weeks. Then discontinue aspirin.   clobetasol ointment 0.05 % Commonly known as: TEMOVATE Apply 1 application topically 2 (two) times daily.   fexofenadine 180 MG tablet Commonly known as: ALLEGRA Take 180 mg by mouth daily.   HYDROcodone-acetaminophen 5-325 MG tablet Commonly known as: NORCO/VICODIN Take 1-2 tablets by mouth every 6 (six) hours as needed for severe pain.   ketoconazole 2 % shampoo Commonly known as: NIZORAL APPLY 1 3 TIMES A WEEK TO SCALP. LET SIT 10 MIN THEN RINSE OUT. Strength: 2 %   LIQUID TEARS OP Place 1 drop into both eyes daily as needed (DRY EYE).   lisinopril 10 MG tablet Commonly known as: ZESTRIL TAKE 1 TABLET BY MOUTH DAILY. PLEASE KEEP UPCOMING APPOINTMENT WITH DR. Tamala Julian IN APRIL 2023   methocarbamol 500 MG  tablet Commonly known as: ROBAXIN Take 1 tablet (500 mg total) by mouth every 6 (six) hours as needed for muscle spasms.   metoprolol succinate 25 MG 24 hr tablet Commonly known as: TOPROL-XL TAKE 1 TABLET (25 MG TOTAL) BY MOUTH DAILY. PLEASE KEEP UPCOMING APPT IN APRIL   Praluent 150 MG/ML Soaj Generic drug: Alirocumab INJECT 1 PEN INTO THE SKIN EVERY 14 (FOURTEEN) DAYS.   traMADol 50 MG tablet Commonly known as: ULTRAM Take 1-2 tablets (50-100 mg total) by mouth every 6 (six) hours as needed for moderate pain.   triamcinolone cream 0.1 % Commonly known as: KENALOG Apply 1 Application topically daily as needed for rash.   valACYclovir 1000 MG tablet Commonly known as: VALTREX TAKE 2 TABLETS BY MOUTH TWICE A DAY AS NEEDED FOR FLARES               Discharge Care Instructions  (From admission, onward)           Start     Ordered   07/24/21 0000  Weight bearing as tolerated        07/24/21 0747   07/24/21 0000  Change dressing       Comments: You have an adhesive waterproof bandage over the incision. Leave this in place until your first follow-up appointment. Once you remove this you will not need to place another bandage.   07/24/21 0747            Follow-up Information     Jonathan Arabian, MD. Go on 08/05/2021.   Specialty: Orthopedic Surgery Why: You are scheduled for a follow up appointment on 08-05-21 at 2:30 pm. Contact information: [address] [place] [address] [phone]                 Signed: R. Jaynie Bream, PA-C Orthopedic Surgery 07/30/2021, 1:56 PM

## 2021-07-31 ENCOUNTER — Ambulatory Visit: Payer: Medicare Other | Admitting: Family Medicine

## 2021-08-26 DIAGNOSIS — Z4789 Encounter for other orthopedic aftercare: Secondary | ICD-10-CM | POA: Diagnosis not present

## 2021-09-04 ENCOUNTER — Encounter (HOSPITAL_COMMUNITY): Payer: Self-pay | Admitting: Gastroenterology

## 2021-09-10 ENCOUNTER — Other Ambulatory Visit: Payer: Self-pay

## 2021-09-10 ENCOUNTER — Ambulatory Visit (HOSPITAL_BASED_OUTPATIENT_CLINIC_OR_DEPARTMENT_OTHER): Payer: Medicare Other | Admitting: Certified Registered Nurse Anesthetist

## 2021-09-10 ENCOUNTER — Encounter (HOSPITAL_COMMUNITY): Payer: Self-pay | Admitting: Gastroenterology

## 2021-09-10 ENCOUNTER — Encounter (HOSPITAL_COMMUNITY)
Admission: RE | Disposition: A | Discharge status: Home / Self Care | Payer: Self-pay | Source: Ambulatory Visit | Attending: Gastroenterology

## 2021-09-10 ENCOUNTER — Ambulatory Visit (HOSPITAL_COMMUNITY): Payer: Medicare Other | Admitting: Certified Registered Nurse Anesthetist

## 2021-09-10 ENCOUNTER — Ambulatory Visit (HOSPITAL_COMMUNITY)
Admission: RE | Admit: 2021-09-10 | Discharge: 2021-09-10 | Disposition: A | Discharge status: Home / Self Care | Payer: Medicare Other | Source: Ambulatory Visit | Attending: Gastroenterology | Admitting: Gastroenterology

## 2021-09-10 DIAGNOSIS — I509 Heart failure, unspecified: Secondary | ICD-10-CM | POA: Insufficient documentation

## 2021-09-10 DIAGNOSIS — Z79899 Other long term (current) drug therapy: Secondary | ICD-10-CM | POA: Diagnosis not present

## 2021-09-10 DIAGNOSIS — I4891 Unspecified atrial fibrillation: Secondary | ICD-10-CM | POA: Diagnosis not present

## 2021-09-10 DIAGNOSIS — Z86010 Personal history of colonic polyps: Secondary | ICD-10-CM

## 2021-09-10 DIAGNOSIS — I252 Old myocardial infarction: Secondary | ICD-10-CM | POA: Insufficient documentation

## 2021-09-10 DIAGNOSIS — Z1211 Encounter for screening for malignant neoplasm of colon: Secondary | ICD-10-CM | POA: Diagnosis not present

## 2021-09-10 DIAGNOSIS — I110 Hypertensive heart disease with heart failure: Secondary | ICD-10-CM | POA: Insufficient documentation

## 2021-09-10 DIAGNOSIS — I2510 Atherosclerotic heart disease of native coronary artery without angina pectoris: Secondary | ICD-10-CM | POA: Insufficient documentation

## 2021-09-10 DIAGNOSIS — Z09 Encounter for follow-up examination after completed treatment for conditions other than malignant neoplasm: Secondary | ICD-10-CM | POA: Diagnosis not present

## 2021-09-10 DIAGNOSIS — Z87891 Personal history of nicotine dependence: Secondary | ICD-10-CM | POA: Insufficient documentation

## 2021-09-10 DIAGNOSIS — I5032 Chronic diastolic (congestive) heart failure: Secondary | ICD-10-CM

## 2021-09-10 DIAGNOSIS — Z860101 Personal history of adenomatous and serrated colon polyps: Secondary | ICD-10-CM

## 2021-09-10 DIAGNOSIS — K640 First degree hemorrhoids: Secondary | ICD-10-CM

## 2021-09-10 DIAGNOSIS — Z538 Procedure and treatment not carried out for other reasons: Secondary | ICD-10-CM | POA: Diagnosis not present

## 2021-09-10 SURGERY — CANCELLED PROCEDURE
Anesthesia: Monitor Anesthesia Care

## 2021-09-10 MED ORDER — SODIUM CHLORIDE 0.9 % IV SOLN: INTRAVENOUS | Status: DC

## 2021-09-10 MED ORDER — PROPOFOL 500 MG/50ML IV EMUL
INTRAVENOUS | Status: DC | PRN
  Administered 2021-09-10: 150 ug/kg/min via INTRAVENOUS

## 2021-09-10 MED ORDER — PROPOFOL 10 MG/ML IV BOLUS
INTRAVENOUS | Status: DC | PRN
  Administered 2021-09-10 (×3): 30 mg via INTRAVENOUS

## 2021-09-10 MED ORDER — LIDOCAINE 2% (20 MG/ML) 5 ML SYRINGE
INTRAMUSCULAR | Status: DC | PRN
  Administered 2021-09-10: 60 mg via INTRAVENOUS

## 2021-09-10 MED ORDER — PHENYLEPHRINE 80 MCG/ML (10ML) SYRINGE FOR IV PUSH (FOR BLOOD PRESSURE SUPPORT)
PREFILLED_SYRINGE | INTRAVENOUS | Status: DC | PRN
  Administered 2021-09-10: 200 ug via INTRAVENOUS

## 2021-09-10 MED ORDER — LACTATED RINGERS IV SOLN
INTRAVENOUS | Status: AC | PRN
  Administered 2021-09-10: 10 mL/h via INTRAVENOUS

## 2021-09-10 SURGICAL SUPPLY — 22 items

## 2021-09-10 NOTE — Discharge Instructions (Signed)

## 2021-09-10 NOTE — Interval H&P Note (Signed)
History and Physical Interval Note:  09/10/2021 12:16 PM  Jonathan Holden  has presented today for surgery, with the diagnosis of History of diverticulitis of the colon/history of colon polyps.  The various methods of treatment have been discussed with the patient and family. After consideration of risks, benefits and other options for treatment, the patient has consented to  Procedure(s): COLONOSCOPY WITH PROPOFOL (N/A) as a surgical intervention.  The patient's history has been reviewed, patient examined, no change in status, stable for surgery.  I have reviewed the patient's chart and labs.  Questions were answered to the patient's satisfaction.     [name]

## 2021-09-10 NOTE — Anesthesia Procedure Notes (Signed)
Procedure Name: MAC Date/Time: 09/10/2021 12:45 PM  Performed by: Claudia Desanctis, CRNAPre-anesthesia Checklist: Patient identified, Emergency Drugs available, Suction available and Patient being monitored Patient Re-evaluated:Patient Re-evaluated prior to induction Oxygen Delivery Method: Simple face mask

## 2021-09-10 NOTE — Anesthesia Preprocedure Evaluation (Signed)
Anesthesia Evaluation  Patient identified by MRN, date of birth, ID band Patient awake    Reviewed: Allergy & Precautions, NPO status , Patient's Chart, lab work & pertinent test results, reviewed documented beta blocker date and time   Airway Mallampati: III  TM Distance: >3 FB Neck ROM: Full    Dental  (+) Teeth Intact, Dental Advisory Given   Pulmonary former smoker,    Pulmonary exam normal breath sounds clear to auscultation       Cardiovascular hypertension, Pt. on medications and Pt. on home beta blockers (-) angina+ CAD, + Past MI and +CHF  Normal cardiovascular exam+ dysrhythmias Atrial Fibrillation  Rhythm:Regular Rate:Normal     Neuro/Psych negative neurological ROS     GI/Hepatic Neg liver ROS, GERD  ,History of diverticulitis of the colon/history of colon polyps   Endo/Other  negative endocrine ROS  Renal/GU negative Renal ROS     Musculoskeletal negative musculoskeletal ROS (+)   Abdominal   Peds  Hematology negative hematology ROS (+)   Anesthesia Other Findings Day of surgery medications reviewed with the patient.  Reproductive/Obstetrics                             Anesthesia Physical Anesthesia Plan  ASA: 3  Anesthesia Plan: MAC   Post-op Pain Management:    Induction: Intravenous  PONV Risk Score and Plan: 1 and TIVA and Treatment may vary due to age or medical condition  Airway Management Planned: Nasal Cannula and Natural Airway  Additional Equipment:   Intra-op Plan:   Post-operative Plan:   Informed Consent: I have reviewed the patients History and Physical, chart, labs and discussed the procedure including the risks, benefits and alternatives for the proposed anesthesia with the patient or authorized representative who has indicated his/her understanding and acceptance.     Dental advisory given  Plan Discussed with: CRNA and  Anesthesiologist  Anesthesia Plan Comments:         Anesthesia Quick Evaluation

## 2021-09-10 NOTE — Op Note (Signed)
[facility] Patient Name: Jonathan Holden Procedure Date: 09/10/2021 MRN: [id number] Attending MD: [name] , MD Date of Birth: Dec 29, 1945 CSN: [id number] Age: 76 Admit Type: Outpatient Procedure:                Colonoscopy Indications:              High risk colon cancer surveillance: Personal                            history of colonic polyps, Last colonoscopy: March                            2016 Providers:                [name], MD, Doristine Johns, RN,                            Fransico Setters Mbumina, Technician Referring MD:              Medicines:                Propofol per Anesthesia, Monitored Anesthesia Care Complications:            No immediate complications. Estimated Blood Loss:     Estimated blood loss: none. Procedure:                Pre-Anesthesia Assessment:                           - Prior to the procedure, a History and Physical                            was performed, and patient medications and                            allergies were reviewed. The patient's tolerance of                            previous anesthesia was also reviewed. The risks                            and benefits of the procedure and the sedation                            options and risks were discussed with the patient.                            All questions were answered, and informed consent                            was obtained. Prior Anticoagulants: The patient has                            taken no previous anticoagulant or antiplatelet                            agents. ASA  Grade Assessment: III - A patient with                            severe systemic disease. After reviewing the risks                            and benefits, the patient was deemed in                            satisfactory condition to undergo the procedure.                           After obtaining informed consent, the colonoscope                            was  passed under direct vision. Throughout the                            procedure, the patient's blood pressure, pulse, and                            oxygen saturations were monitored continuously. The                            [id number]-HQ190L ([id number]) Olympus colonoscope was                            introduced through the anus and advanced to the the                            sigmoid colon. The colonoscopy was performed with                            difficulty due to inadequate bowel prep and poor                            bowel prep with stool present. The patient                            tolerated the procedure well. The quality of the                            bowel preparation was poor and inadequate. Scope In: 12:54:19 PM Scope Out: 12:57:50 PM Total Procedure Duration: 0 hours 3 minutes 31 seconds  Findings:      The perianal and digital rectal examinations were normal.      Copious quantities of solid stool was found in the rectum, in the       recto-sigmoid colon and in the sigmoid colon, precluding visualization.      Internal hemorrhoids were found during retroflexion. The hemorrhoids       were large and Grade I (internal hemorrhoids that do not prolapse). Impression:               - Preparation of the colon was poor.                           -  Preparation of the colon was inadequate.                           - Stool in the rectum, in the recto-sigmoid colon                            and in the sigmoid colon.                           - Internal hemorrhoids.                           - No specimens collected. Moderate Sedation:      N/A - MAC procedure Recommendation:           - Repeat colonoscopy at appointment to be scheduled                            because the bowel preparation was poor.                           - Two day colon prep needed. Procedure Code(s):        --- Professional ---                           [phone], 65, Colonoscopy, flexible; diagnostic,                             including collection of specimen(s) by brushing or                            washing, when performed (separate procedure) Diagnosis Code(s):        --- Professional ---                           Z86.010, Personal history of colonic polyps                           K64.0, First degree hemorrhoids CPT copyright 2019 [employer]. All rights reserved. The codes documented in this report are preliminary and upon coder review may  be revised to meet current compliance requirements. [name], MD 09/10/2021 1:07:22 PM This report has been signed electronically. Number of Addenda: 0

## 2021-09-10 NOTE — Anesthesia Postprocedure Evaluation (Signed)
Anesthesia Post Note  Patient: Jonathan Holden  Procedure(s) Performed: ABORTED PROCEDURE     Patient location during evaluation: PACU Anesthesia Type: MAC Level of consciousness: awake and alert Pain management: pain level controlled Vital Signs Assessment: post-procedure vital signs reviewed and stable Respiratory status: spontaneous breathing, nonlabored ventilation, respiratory function stable and patient connected to nasal cannula oxygen Cardiovascular status: stable and blood pressure returned to baseline Postop Assessment: no apparent nausea or vomiting Anesthetic complications: no   No notable events documented.  Last Vitals:  Vitals:   09/10/21 1320 09/10/21 1330  BP: (!) 144/90 (!) 149/89  Pulse: 71 69  Resp: (!) 24 11  Temp:    SpO2: 100% 99%    Last Pain:  Vitals:   09/10/21 1330  TempSrc:   PainSc: 0-No pain                 Chaise Passarella

## 2021-09-10 NOTE — Transfer of Care (Signed)
Immediate Anesthesia Transfer of Care Note  Patient: Jonathan Holden  Procedure(s) Performed: COLONOSCOPY WITH PROPOFOL ABORTED PROCEDURE  Patient Location: Endoscopy Unit  Anesthesia Type:MAC  Level of Consciousness: awake and patient cooperative  Airway & Oxygen Therapy: Patient Spontanous Breathing and Patient connected to face mask  Post-op Assessment: Report given to RN and Post -op Vital signs reviewed and stable  Post vital signs: Reviewed and stable  Last Vitals:  Vitals Value Taken Time  BP    Temp    Pulse    Resp    SpO2      Last Pain:  Vitals:   09/10/21 1106  TempSrc: Temporal  PainSc: 0-No pain         Complications: No notable events documented.

## 2021-09-10 NOTE — H&P (Signed)
  Date of Initial H&P: 08/21/21  History reviewed, patient examined, no change in status, stable for surgery.

## 2021-09-11 DIAGNOSIS — M25511 Pain in right shoulder: Secondary | ICD-10-CM | POA: Diagnosis not present

## 2021-09-11 DIAGNOSIS — M19011 Primary osteoarthritis, right shoulder: Secondary | ICD-10-CM | POA: Diagnosis not present

## 2021-09-11 DIAGNOSIS — M170 Bilateral primary osteoarthritis of knee: Secondary | ICD-10-CM | POA: Diagnosis not present

## 2021-09-11 DIAGNOSIS — M1711 Unilateral primary osteoarthritis, right knee: Secondary | ICD-10-CM | POA: Diagnosis not present

## 2021-10-01 ENCOUNTER — Encounter: Payer: Self-pay | Admitting: Family Medicine

## 2021-10-01 ENCOUNTER — Ambulatory Visit (INDEPENDENT_AMBULATORY_CARE_PROVIDER_SITE_OTHER): Payer: Medicare Other | Admitting: Family Medicine

## 2021-10-01 VITALS — BP 126/76 | HR 75 | Temp 98.2°F | Ht 74.0 in | Wt 212.6 lb

## 2021-10-01 DIAGNOSIS — K219 Gastro-esophageal reflux disease without esophagitis: Secondary | ICD-10-CM

## 2021-10-01 DIAGNOSIS — I480 Paroxysmal atrial fibrillation: Secondary | ICD-10-CM | POA: Diagnosis not present

## 2021-10-01 DIAGNOSIS — E785 Hyperlipidemia, unspecified: Secondary | ICD-10-CM | POA: Diagnosis not present

## 2021-10-01 DIAGNOSIS — M1612 Unilateral primary osteoarthritis, left hip: Secondary | ICD-10-CM

## 2021-10-01 DIAGNOSIS — I10 Essential (primary) hypertension: Secondary | ICD-10-CM

## 2021-10-01 DIAGNOSIS — I25119 Atherosclerotic heart disease of native coronary artery with unspecified angina pectoris: Secondary | ICD-10-CM | POA: Diagnosis not present

## 2021-10-01 NOTE — Assessment & Plan Note (Signed)
Stable.  Continue management per cardiology. 

## 2021-10-01 NOTE — Assessment & Plan Note (Signed)
At goal lisinopril 10 mg daily and metoprolol succinate 25 mg daily.

## 2021-10-01 NOTE — Assessment & Plan Note (Signed)
Regular rate and rhythm today.  Continue management per cardiology.

## 2021-10-01 NOTE — Assessment & Plan Note (Signed)
Follows with cardiology.  Currently on Praluent.  He will get lipid panel done with them soon.

## 2021-10-01 NOTE — Progress Notes (Signed)
Jonathan Holden is a 76 y.o. male who presents today for an office visit.  Assessment/Plan:  Chronic Problems Addressed Today: HTN (hypertension) At goal lisinopril 10 mg daily and metoprolol succinate 25 mg daily.  Hyperlipidemia LDL goal <70, on Praluent Follows with cardiology.  Currently on Praluent.  He will get lipid panel done with them soon.  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  Had left hip replacement earlier this year.  Doing well postoperatively.  Coronary artery disease involving native coronary artery of native heart with angina pectoris (HCC) Stable.  Continue management per cardiology.  Paroxysmal atrial fibrillation with RVR (HCC) Regular rate and rhythm today.  Continue management per cardiology.    Subjective:  HPI:  See A/p for status of chronic conditions.  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[place])    Insomnia 04/18/2016   Left retinal detachment 03/18/2015   Loose body of right shoulder    Myocardial infarction ([place])    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   Pseudophakia of left eye 05/22/2013   Pseudophakia of right eye 06/05/2013   S/P coronary angioplasty    Streptococcal bacteremia    Patient Active Problem List   Diagnosis Date Noted   History of adenomatous polyp of colon 09/10/2021   Osteoarthritis of left hip s/p replacement 07/2021 07/23/2021   Unsteady gait 07/25/2020   CHF (congestive heart failure) ([place]) 07/25/2019   Cold sore 07/25/2019    Diverticulosis 04/18/2016   Insomnia 04/18/2016   H/O gastric ulcer    Former smoker    Gastroesophageal reflux disease    Paroxysmal atrial fibrillation with RVR ([place]) 02/21/2016   Coronary artery disease involving native coronary artery of native heart with angina pectoris ([place]) 09/19/2014   HTN (hypertension) 09/19/2014   Hyperlipidemia LDL goal <70, on Praluent 10/03/2013   Past Surgical History:  Procedure Laterality Date   CARDIOVASCULAR STRESS TEST  08/22/2008   CORONARY ANGIOPLASTY  1992   ERCP W/ SPHICTEROTOMY  12/15/2002   LAPAROSCOPIC CHOLECYSTECTOMY  12/16/2002   LUMBAR LAMINECTOMY     L5   RIGHT SHOULDER SURGERY      TEE WITHOUT CARDIOVERSION N/A 02/28/2016   Procedure: TRANSESOPHAGEAL ECHOCARDIOGRAM (TEE);  Surgeon: Fay Records, MD;  Location: [facility];  Service: Cardiovascular;  Laterality: N/A;   TOTAL HIP ARTHROPLASTY Left 07/23/2021   Procedure: TOTAL HIP ARTHROPLASTY ANTERIOR APPROACH;  Surgeon: Gaynelle Arabian, MD;  Location: [facility];  Service: Orthopedics;  Laterality: Left;    Family History  Problem Relation Age of Onset   Other Mother        AGE 42 HEALTHY   Heart attack Father 23       2 MI   Heart disease Maternal Grandfather    Heart disease Paternal Grandfather    Emphysema Other    Other Brother     Medications- reviewed and updated Current Outpatient Medications  Medication Sig Dispense Refill   amiodarone (PACERONE) 200 MG tablet TAKE 1  TABLET (200 MG TOTAL) BY MOUTH DAILY. \ 90 tablet 3   clobetasol ointment (TEMOVATE) 0.10 % Apply 1 application topically 2 (two) times daily. (Patient taking differently: Apply 1 application  topically 2 (two) times daily as needed (eczema).) 30 g 2   HYDROcodone-acetaminophen (NORCO/VICODIN) 5-325 MG tablet Take 1-2 tablets by mouth every 6 (six) hours as needed for severe pain. 42 tablet 0   ketoconazole (NIZORAL) 2 % shampoo APPLY 1 3 TIMES A WEEK TO SCALP. LET SIT 10 MIN THEN RINSE OUT. Strength: 2 % 120  mL 3   lisinopril (ZESTRIL) 10 MG tablet TAKE 1 TABLET BY MOUTH DAILY. PLEASE KEEP UPCOMING APPOINTMENT WITH DR. Tamala Julian IN APRIL 2023 90 tablet 3   methocarbamol (ROBAXIN) 500 MG tablet Take 1 tablet (500 mg total) by mouth every 6 (six) hours as needed for muscle spasms. 40 tablet 0   metoprolol succinate (TOPROL-XL) 25 MG 24 hr tablet TAKE 1 TABLET (25 MG TOTAL) BY MOUTH DAILY. PLEASE KEEP UPCOMING APPT IN APRIL 90 tablet 3   Polyvinyl Alcohol (LIQUID TEARS OP) Place 1 drop into both eyes daily as needed (DRY EYE).     PRALUENT 150 MG/ML SOAJ INJECT 1 PEN INTO THE SKIN EVERY 14 (FOURTEEN) DAYS. 6 mL 3   traMADol (ULTRAM) 50 MG tablet Take 1-2 tablets (50-100 mg total) by mouth every 6 (six) hours as needed for moderate pain. 40 tablet 0   triamcinolone cream (KENALOG) 0.1 % Apply 1 Application topically daily as needed (dry skin).     valACYclovir (VALTREX) 1000 MG tablet TAKE 2 TABLETS BY MOUTH TWICE A DAY AS NEEDED FOR FLARES (Patient taking differently: Take 1,000-2,000 mg by mouth See admin instructions. Take 2000 mg at the onset of a flare then take 1000 mg twice daily as needed for fever blisters) 180 tablet 1   No current facility-administered medications for this visit.    Allergies-reviewed and updated Allergies  Allergen Reactions   Eliquis [Apixaban] Other (See Comments)    Recurrent GI bleeding occurred from diverticulosis in the setting of anticoagulation therapy for atrial fibrillation.   Food Other (See Comments)    Pt is allergic to melons caused scratchy throat    Glutamic Acid Other (See Comments)    Pt is allergic to melons caused scratchy throat    Grass Pollen(K-O-R-T-Swt Vern)     Sneezing    Ciprofloxacin Hives   Pollen Extract Other (See Comments)    Sneezing     Social History   Socioeconomic History   Marital status: Divorced    Spouse name: Not on file   Number of children: Not on file   Years of education: Not on file   Highest education level: Not  on file  Occupational History   Occupation: sales  Tobacco Use   Smoking status: Former    Packs/day: 2.00    Years: 20.00    Total pack years: 40.00    Types: Cigarettes    Quit date: 03/27/1990    Years since quitting: 31.5   Smokeless tobacco: Never  Vaping Use   Vaping Use: Never used  Substance and Sexual Activity   Alcohol use: Yes    Comment: ONLY DRINK WINE DAILY   Drug use: No   Sexual activity: Not Currently  Other Topics Concern   Not on file  Social History Narrative   Not on file   Social Determinants of Health   Financial Resource Strain: Low Risk  (09/27/2020)   Overall Financial  Resource Strain (CARDIA)    Difficulty of Paying Living Expenses: Not hard at all  Food Insecurity: No Food Insecurity (09/27/2020)   Hunger Vital Sign    Worried About Running Out of Food in the Last Year: Never true    Ran Out of Food in the Last Year: Never true  Transportation Needs: No Transportation Needs (09/27/2020)   PRAPARE - Hydrologist (Medical): No    Lack of Transportation (Non-Medical): No  Physical Activity: Insufficiently Active (09/27/2020)   Exercise Vital Sign    Days of Exercise per Week: 3 days    Minutes of Exercise per Session: 30 min  Stress: No Stress Concern Present (09/27/2020)   [place]    Feeling of Stress : Not at all  Social Connections: Socially Isolated (09/27/2020)   Social Connection and Isolation Panel [NHANES]    Frequency of Communication with Friends and Family: More than three times a week    Frequency of Social Gatherings with Friends and Family: More than three times a week    Attends Religious Services: Never    Marine scientist or Organizations: No    Attends Music therapist: Never    Marital Status: Divorced          Objective:  Physical Exam: BP 126/76   Pulse 75   Temp 98.2 F (36.8 C) (Temporal)   Ht '6\' 2"'$  (1.88  m)   Wt 212 lb 9.6 oz (96.4 kg)   SpO2 96%   BMI 27.30 kg/m   Wt Readings from Last 3 Encounters:  10/01/21 212 lb 9.6 oz (96.4 kg)  09/10/21 206 lb (93.4 kg)  07/23/21 209 lb 7 oz (95 kg)    Gen: No acute distress, resting comfortably CV: Regular rate and rhythm with no murmurs appreciated Pulm: Normal work of breathing, clear to auscultation bilaterally with no crackles, wheezes, or rhonchi Neuro: Grossly normal, moves all extremities Psych: Normal affect and thought content      Zohair Epp M. Jerline Pain, MD 10/01/2021 9:17 AM

## 2021-10-01 NOTE — Patient Instructions (Signed)
It was very nice to see you today!  Keep up the great work!  No changes today.  I will see back in year for your next annual checkup.  Please come back to see [place] sooner if needed.  Take care, Dr Jerline Pain  PLEASE NOTE:  If you had any lab tests please let us know if you have not heard back within a few days. You may see your results on mychart before we have a chance to review them but we will give you a call once they are reviewed by [place]. If we ordered any referrals today, please let us know if you have not heard from their office within the next week.   Please try these tips to maintain a healthy lifestyle:  Eat at least 3 REAL meals and 1-2 snacks per day.  Aim for no more than 5 hours between eating.  If you eat breakfast, please do so within one hour of getting up.   Each meal should contain half fruits/vegetables, one quarter protein, and one quarter carbs (no bigger than a computer mouse)  Cut down on sweet beverages. This includes juice, soda, and sweet tea.   Drink at least 1 glass of water with each meal and aim for at least 8 glasses per day  Exercise at least 150 minutes every week.

## 2021-10-01 NOTE — Assessment & Plan Note (Signed)
Follows with orthopedics.  Had left hip replacement earlier this year.  Doing well postoperatively.

## 2021-10-09 ENCOUNTER — Ambulatory Visit (INDEPENDENT_AMBULATORY_CARE_PROVIDER_SITE_OTHER): Payer: Medicare Other

## 2021-10-09 DIAGNOSIS — Z0000 Encounter for general adult medical examination without abnormal findings: Secondary | ICD-10-CM

## 2021-10-09 NOTE — Patient Instructions (Signed)
Jonathan Holden , Thank you for taking time to come for your Medicare Wellness Visit. I appreciate your ongoing commitment to your health goals. Please review the following plan we discussed and let me know if I can assist you in the future.   These are the goals we discussed:  Goals      LDL CALC < 70     Patient Stated     Lose weight      Patient Stated     Get a little muscle tone and decrease belly weight      Patient Stated     None at this time      Walk independently in the next 30-60 days        This is a list of the screening recommended for you and due dates:  Health Maintenance  Topic Date Due   COVID-19 Vaccine (4 - Mixed Product risk series) 10/17/2021*   Zoster (Shingles) Vaccine (1 of 2) 12/31/2021*   Flu Shot  04/20/2022*   Pneumonia Vaccine (1 - PCV) 10/02/2022*   Tetanus Vaccine  10/02/2022*   Hepatitis C Screening: USPSTF Recommendation to screen - Ages 18-79 yo.  Completed   HPV Vaccine  Aged Out   Colon Cancer Screening  Discontinued  *Topic was postponed. The date shown is not the original due date.    Advanced directives: Please bring a copy of your health care power of attorney and living will to the office at your convenience.  Conditions/risks identified: none at this time   Next appointment: Follow up in one year for your annual wellness visit.   Preventive Care 83 Years and Older, Male  Preventive care refers to lifestyle choices and visits with your health care provider that can promote health and wellness. What does preventive care include? A yearly physical exam. This is also called an annual well check. Dental exams once or twice a year. Routine eye exams. Ask your health care provider how often you should have your eyes checked. Personal lifestyle choices, including: Daily care of your teeth and gums. Regular physical activity. Eating a healthy diet. Avoiding tobacco and drug use. Limiting alcohol use. Practicing safe sex. Taking low  doses of aspirin every day. Taking vitamin and mineral supplements as recommended by your health care provider. What happens during an annual well check? The services and screenings done by your health care provider during your annual well check will depend on your age, overall health, lifestyle risk factors, and family history of disease. Counseling  Your health care provider may ask you questions about your: Alcohol use. Tobacco use. Drug use. Emotional well-being. Home and relationship well-being. Sexual activity. Eating habits. History of falls. Memory and ability to understand (cognition). Work and work Statistician. Screening  You may have the following tests or measurements: Height, weight, and BMI. Blood pressure. Lipid and cholesterol levels. These may be checked every 5 years, or more frequently if you are over 40 years old. Skin check. Lung cancer screening. You may have this screening every year starting at age 71 if you have a 30-pack-year history of smoking and currently smoke or have quit within the past 15 years. Fecal occult blood test (FOBT) of the stool. You may have this test every year starting at age 11. Flexible sigmoidoscopy or colonoscopy. You may have a sigmoidoscopy every 5 years or a colonoscopy every 10 years starting at age 73. Prostate cancer screening. Recommendations will vary depending on your family history and other risks. Hepatitis  C blood test. Hepatitis B blood test. Sexually transmitted disease (STD) testing. Diabetes screening. This is done by checking your blood sugar (glucose) after you have not eaten for a while (fasting). You may have this done every 1-3 years. Abdominal aortic aneurysm (AAA) screening. You may need this if you are a current or former smoker. Osteoporosis. You may be screened starting at age 39 if you are at high risk. Talk with your health care provider about your test results, treatment options, and if necessary, the need  for more tests. Vaccines  Your health care provider may recommend certain vaccines, such as: Influenza vaccine. This is recommended every year. Tetanus, diphtheria, and acellular pertussis (Tdap, Td) vaccine. You may need a Td booster every 10 years. Zoster vaccine. You may need this after age 37. Pneumococcal 13-valent conjugate (PCV13) vaccine. One dose is recommended after age 93. Pneumococcal polysaccharide (PPSV23) vaccine. One dose is recommended after age 24. Talk to your health care provider about which screenings and vaccines you need and how often you need them. This information is not intended to replace advice given to you by your health care provider. Make sure you discuss any questions you have with your health care provider. Document Released: 02/01/2015 Document Revised: 09/25/2015 Document Reviewed: 11/06/2014 Elsevier Interactive Patient Education  2017 [place] Prevention in the Home Falls can cause injuries. They can happen to people of all ages. There are many things you can do to make your home safe and to help prevent falls. What can I do on the outside of my home? Regularly fix the edges of walkways and driveways and fix any cracks. Remove anything that might make you trip as you walk through a door, such as a raised step or threshold. Trim any bushes or trees on the path to your home. Use bright outdoor lighting. Clear any walking paths of anything that might make someone trip, such as rocks or tools. Regularly check to see if handrails are loose or broken. Make sure that both sides of any steps have handrails. Any raised decks and porches should have guardrails on the edges. Have any leaves, snow, or ice cleared regularly. Use sand or salt on walking paths during winter. Clean up any spills in your garage right away. This includes oil or grease spills. What can I do in the bathroom? Use night lights. Install grab bars by the toilet and in the tub and  shower. Do not use towel bars as grab bars. Use non-skid mats or decals in the tub or shower. If you need to sit down in the shower, use a plastic, non-slip stool. Keep the floor dry. Clean up any water that spills on the floor as soon as it happens. Remove soap buildup in the tub or shower regularly. Attach bath mats securely with double-sided non-slip rug tape. Do not have throw rugs and other things on the floor that can make you trip. What can I do in the bedroom? Use night lights. Make sure that you have a light by your bed that is easy to reach. Do not use any sheets or blankets that are too big for your bed. They should not hang down onto the floor. Have a firm chair that has side arms. You can use this for support while you get dressed. Do not have throw rugs and other things on the floor that can make you trip. What can I do in the kitchen? Clean up any spills right away. Avoid walking  on wet floors. Keep items that you use a lot in easy-to-reach places. If you need to reach something above you, use a strong step stool that has a grab bar. Keep electrical cords out of the way. Do not use floor polish or wax that makes floors slippery. If you must use wax, use non-skid floor wax. Do not have throw rugs and other things on the floor that can make you trip. What can I do with my stairs? Do not leave any items on the stairs. Make sure that there are handrails on both sides of the stairs and use them. Fix handrails that are broken or loose. Make sure that handrails are as long as the stairways. Check any carpeting to make sure that it is firmly attached to the stairs. Fix any carpet that is loose or worn. Avoid having throw rugs at the top or bottom of the stairs. If you do have throw rugs, attach them to the floor with carpet tape. Make sure that you have a light switch at the top of the stairs and the bottom of the stairs. If you do not have them, ask someone to add them for  you. What else can I do to help prevent falls? Wear shoes that: Do not have high heels. Have rubber bottoms. Are comfortable and fit you well. Are closed at the toe. Do not wear sandals. If you use a stepladder: Make sure that it is fully opened. Do not climb a closed stepladder. Make sure that both sides of the stepladder are locked into place. Ask someone to hold it for you, if possible. Clearly mark and make sure that you can see: Any grab bars or handrails. First and last steps. Where the edge of each step is. Use tools that help you move around (mobility aids) if they are needed. These include: Canes. Walkers. Scooters. Crutches. Turn on the lights when you go into a dark area. Replace any light bulbs as soon as they burn out. Set up your furniture so you have a clear path. Avoid moving your furniture around. If any of your floors are uneven, fix them. If there are any pets around you, be aware of where they are. Review your medicines with your doctor. Some medicines can make you feel dizzy. This can increase your chance of falling. Ask your doctor what other things that you can do to help prevent falls. This information is not intended to replace advice given to you by your health care provider. Make sure you discuss any questions you have with your health care provider. Document Released: 11/01/2008 Document Revised: 06/13/2015 Document Reviewed: 02/09/2014 Elsevier Interactive Patient Education  2017 [employer].

## 2021-10-09 NOTE — Progress Notes (Signed)
Virtual Visit via Telephone Note  I connected with  Jonathan Holden on 10/09/21 at  9:00 AM EDT by telephone and verified that I am speaking with the correct person using two identifiers.  Medicare Annual Wellness visit completed telephonically due to Covid-19 pandemic.   Persons participating in this call: This Health Coach and this patient.   Location: Patient: home Provider: offfice   I discussed the limitations, risks, security and privacy concerns of performing an evaluation and management service by telephone and the availability of in person appointments. The patient expressed understanding and agreed to proceed.  Unable to perform video visit due to video visit attempted and failed and/or patient does not have video capability.   Some vital signs may be absent or patient reported.   Willette Brace, LPN   Subjective:   Jonathan Holden is a 76 y.o. male who presents for Medicare Annual/Subsequent preventive examination.  Review of Systems     Cardiac Risk Factors include: advanced age (>41mn, >>23women);dyslipidemia;hypertension;male gender     Objective:    There were no vitals filed for this visit. There is no height or weight on file to calculate BMI.     10/09/2021    9:11 AM 09/10/2021   11:01 AM 07/23/2021    2:51 PM 07/15/2021    9:06 AM 09/27/2020    8:14 AM 08/15/2020   11:58 AM 09/21/2019    8:14 AM  Advanced Directives  Does Patient Have a Medical Advance Directive? Yes Yes No Yes No No Yes  Type of AParamed[place]of [place]irch Creek Colony[place] will Healthcare Power of [place] will   [place] will  Copy of HM[place] Chart? No - copy requested No - copy requested     No - copy requested  Would patient like information on creating a medical advance directive?   No - Patient declined  No - Patient declined No - Patient declined     Current Medications  (verified) Outpatient Encounter Medications as of 10/09/2021  Medication Sig   amiodarone (PACERONE) 200 MG tablet TAKE 1 TABLET (200 MG TOTAL) BY MOUTH DAILY. \   clobetasol ointment (TEMOVATE) 04.12% Apply 1 application topically 2 (two) times daily. (Patient taking differently: Apply 1 application  topically 2 (two) times daily as needed (eczema).)   HYDROcodone-acetaminophen (NORCO/VICODIN) 5-325 MG tablet Take 1-2 tablets by mouth every 6 (six) hours as needed for severe pain.   ketoconazole (NIZORAL) 2 % shampoo APPLY 1 3 TIMES A WEEK TO SCALP. LET SIT 10 MIN THEN RINSE OUT. Strength: 2 %   lisinopril (ZESTRIL) 10 MG tablet TAKE 1 TABLET BY MOUTH DAILY. PLEASE KEEP UPCOMING APPOINTMENT WITH DR. STamala JulianIN APRIL 2023   methocarbamol (ROBAXIN) 500 MG tablet Take 1 tablet (500 mg total) by mouth every 6 (six) hours as needed for muscle spasms.   metoprolol succinate (TOPROL-XL) 25 MG 24 hr tablet TAKE 1 TABLET (25 MG TOTAL) BY MOUTH DAILY. PLEASE KEEP UPCOMING APPT IN APRIL   Polyvinyl Alcohol (LIQUID TEARS OP) Place 1 drop into both eyes daily as needed (DRY EYE).   PRALUENT 150 MG/ML SOAJ INJECT 1 PEN INTO THE SKIN EVERY 14 (FOURTEEN) DAYS.   traMADol (ULTRAM) 50 MG tablet Take 1-2 tablets (50-100 mg total) by mouth every 6 (six) hours as needed for moderate pain.   triamcinolone cream (KENALOG) 0.1 % Apply 1 Application topically daily as needed (dry skin).  valACYclovir (VALTREX) 1000 MG tablet TAKE 2 TABLETS BY MOUTH TWICE A DAY AS NEEDED FOR FLARES (Patient taking differently: Take 1,000-2,000 mg by mouth See admin instructions. Take 2000 mg at the onset of a flare then take 1000 mg twice daily as needed for fever blisters)   No facility-administered encounter medications on file as of 10/09/2021.    Allergies (verified) Eliquis [apixaban], Food, Glutamic acid, Grass pollen(k-o-r-t-swt vern), Ciprofloxacin, and Pollen extract   History: Past Medical History:  Diagnosis Date   Acute  lower GI bleeding 03/19/2016   Arthritis    Atrial fibrillation with RVR ([place]) 02/21/2016   Coronary artery disease    Diverticulosis 04/18/2016   Dysrhythmia    AFIB   Endocarditis of native valve    Former smoker    H/O gastric ulcer    History of MI (myocardial infarction) 1992   Hyperlipidemia    Hypertension    Infection of intervertebral disc ([place])    Insomnia 04/18/2016   Left retinal detachment 03/18/2015   Loose body of right shoulder    Myocardial infarction ([place])    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   Pseudophakia of left eye 05/22/2013   Pseudophakia of right eye 06/05/2013   S/P coronary angioplasty    Streptococcal bacteremia    Past Surgical History:  Procedure Laterality Date   CARDIOVASCULAR STRESS TEST  08/22/2008   CORONARY ANGIOPLASTY  1992   ERCP W/ SPHICTEROTOMY  12/15/2002   LAPAROSCOPIC CHOLECYSTECTOMY  12/16/2002   LUMBAR LAMINECTOMY     L5   RIGHT SHOULDER SURGERY      TEE WITHOUT CARDIOVERSION N/A 02/28/2016   Procedure: TRANSESOPHAGEAL ECHOCARDIOGRAM (TEE);  Surgeon: Fay Records, MD;  Location: [place];  Service: Cardiovascular;  Laterality: N/A;   TOTAL HIP ARTHROPLASTY Left 07/23/2021   Procedure: TOTAL HIP ARTHROPLASTY ANTERIOR APPROACH;  Surgeon: Gaynelle Arabian, MD;  Location: [facility];  Service: Orthopedics;  Laterality: Left;   Family History  Problem Relation Age of Onset   Other Mother        AGE 38 HEALTHY   Heart attack Father 54       2 MI   Heart disease Maternal Grandfather    Heart disease Paternal Grandfather    Emphysema Other    Other Brother    Social History   Socioeconomic History   Marital status: Divorced    Spouse name: Not on file   Number of children: Not on file   Years of education: Not on file   Highest education level: Not on file  Occupational History   Occupation: sales  Tobacco Use   Smoking status: Former    Packs/day: 2.00    Years: 20.00    Total pack years: 40.00     Types: Cigarettes    Quit date: 03/27/1990    Years since quitting: 31.5   Smokeless tobacco: Never  Vaping Use   Vaping Use: Never used  Substance and Sexual Activity   Alcohol use: Yes    Comment: ONLY DRINK WINE DAILY   Drug use: No   Sexual activity: Not Currently  Other Topics Concern   Not on file  Social History Narrative   Not on file   Social Determinants of Health   Financial Resource Strain: Low Risk  (10/09/2021)   Overall Financial Resource Strain (CARDIA)    Difficulty of Paying Living Expenses: Not hard at all  Food Insecurity: No Food Insecurity (10/09/2021)   Hunger Vital Sign  Worried About Charity fundraiser in the Last Year: Never true    [place] in the Last Year: Never true  Transportation Needs: No Transportation Needs (10/09/2021)   PRAPARE - Hydrologist (Medical): No    Lack of Transportation (Non-Medical): No  Physical Activity: Insufficiently Active (10/09/2021)   Exercise Vital Sign    Days of Exercise per Week: 2 days    Minutes of Exercise per Session: 40 min  Stress: No Stress Concern Present (10/09/2021)   [place]    Feeling of Stress : Not at all  Social Connections: Socially Isolated (10/09/2021)   Social Connection and Isolation Panel [NHANES]    Frequency of Communication with Friends and Family: More than three times a week    Frequency of Social Gatherings with Friends and Family: More than three times a week    Attends Religious Services: Never    Marine scientist or Organizations: No    Attends Music therapist: Never    Marital Status: Divorced    Tobacco Counseling Counseling given: Not Answered   Clinical Intake:  Pre-visit preparation completed: Yes  Pain : No/denies pain     Nutritional Risks: None Diabetes: No  How often do you need to have someone help you when you read instructions,  pamphlets, or other written materials from your doctor or pharmacy?: 1 - Never  Diabetic?no  Interpreter Needed?: No  Information entered by :: Charlott Rakes, LPN   Activities of Daily Living    10/09/2021    9:10 AM 07/23/2021    2:51 PM  In your present state of health, do you have any difficulty performing the following activities:  Hearing? 0 0  Vision? 0 0  Difficulty concentrating or making decisions? 0 0  Walking or climbing stairs? 0 1  Dressing or bathing? 0 0  Doing errands, shopping? 0 0  Preparing Food and eating ? N   Using the Toilet? N   In the past six months, have you accidently leaked urine? N   Do you have problems with loss of bowel control? N   Managing your Medications? N   Managing your Finances? N   Housekeeping or managing your Housekeeping? N     Patient Care Team: Vivi Barrack, MD as PCP - General (Family Medicine) Belva Crome, MD as PCP - Cardiology (Cardiology) Constance Haw, MD as PCP - Electrophysiology (Cardiology) System, Provider Not In (Oral Surgery) Ashok Pall, MD as Consulting Physician (Neurosurgery) System, Provider Not In (Dentistry) Carlyle Basques, MD as Consulting Physician (Infectious Diseases) Latanya Maudlin, MD as Consulting Physician (Orthopedic Surgery) Merleen Nicely, MD as Referring Physician (Dermatology) Edythe Clarity, [facility] as Pharmacist (Pharmacist)  Indicate any recent Medical Services you may have received from other than [facility] providers in the past year (date may be approximate).     Assessment:   This is a routine wellness examination for [place].  Hearing/Vision screen Hearing Screening - Comments:: Pt denies any hearing issues  Vision Screening - Comments:: Pt follows up with [facility]   Dietary issues and exercise activities discussed: Current Exercise Habits: Structured exercise class, Type of exercise: Other - see comments, Time (Minutes): 40, Frequency (Times/Week): 2,  Weekly Exercise (Minutes/Week): 80   Goals Addressed             This Visit's Progress    Patient Stated  None at this time        Depression Screen    10/09/2021    9:08 AM 10/01/2021    8:46 AM 09/27/2020    8:12 AM 07/25/2020    9:46 AM 09/21/2019    8:13 AM 07/25/2019    9:50 AM 12/10/2017    7:28 AM  PHQ 2/9 Scores  PHQ - 2 Score 0 0 0 0 0 0 0  PHQ- 9 Score       0    Fall Risk    10/09/2021    9:10 AM 10/01/2021    8:46 AM 09/27/2020    8:15 AM 07/25/2020    9:46 AM 09/21/2019    8:16 AM  Fall Risk   Falls in the past year? 0 '1 1 1 '$ 0  Number falls in past yr: 0 1 1 0 0  Injury with Fall? 0 0 0 1 0  Risk for fall due to : No Fall Risks No Fall Risks Impaired balance/gait No Fall Risks Impaired balance/gait  Risk for fall due to: Comment   getting better    Follow up Falls prevention discussed  Falls prevention discussed  Falls prevention discussed    FALL RISK PREVENTION PERTAINING TO THE HOME:  Any stairs in or around the home? No  If so, are there any without handrails? No  Home free of loose throw rugs in walkways, pet beds, electrical cords, etc? Yes  Adequate lighting in your home to reduce risk of falls? Yes   ASSISTIVE DEVICES UTILIZED TO PREVENT FALLS:  Life alert? No  Use of a cane, walker or w/c? No  Grab bars in the bathroom? No  Shower chair or bench in shower? No  Elevated toilet seat or a handicapped toilet? No   TIMED UP AND GO:  Was the test performed? No .  Cognitive Function: pt declined , pt is alert and oriented         09/27/2020    8:17 AM 09/21/2019    8:18 AM  6CIT Screen  What Year? 0 points 0 points  What month? 0 points 0 points  What time? 0 points   Count back from 20 0 points 0 points  Months in reverse 0 points 0 points  Repeat phrase 0 points 0 points  Total Score 0 points     Immunizations Immunization History  Administered Date(s) Administered   Fluad Quad(high Dose 65+) 10/26/2018   Influenza, High Dose  Seasonal PF 12/31/2016, 12/10/2017   Influenza-Unspecified 11/20/2015   Moderna Sars-Covid-2 Vaccination 07/06/2020   PFIZER(Purple Top)SARS-COV-2 Vaccination 02/27/2019, 03/24/2019    TDAP status: Due, Education has been provided regarding the importance of this vaccine. Advised may receive this vaccine at local pharmacy or Health Dept. Aware to provide a copy of the vaccination record if obtained from local pharmacy or Health Dept. Verbalized acceptance and understanding.  Flu Vaccine status: Due, Education has been provided regarding the importance of this vaccine. Advised may receive this vaccine at local pharmacy or Health Dept. Aware to provide a copy of the vaccination record if obtained from local pharmacy or Health Dept. Verbalized acceptance and understanding.  Pneumococcal vaccine status: Due, Education has been provided regarding the importance of this vaccine. Advised may receive this vaccine at local pharmacy or Health Dept. Aware to provide a copy of the vaccination record if obtained from local pharmacy or Health Dept. Verbalized acceptance and understanding.  Covid-19 vaccine status: Completed vaccines  Qualifies for Shingles Vaccine? Yes  Zostavax completed No   Shingrix Completed?: No.    Education has been provided regarding the importance of this vaccine. Patient has been advised to call insurance company to determine out of pocket expense if they have not yet received this vaccine. Advised may also receive vaccine at local pharmacy or Health Dept. Verbalized acceptance and understanding.  Screening Tests Health Maintenance  Topic Date Due   COVID-19 Vaccine (4 - Mixed Product risk series) 10/17/2021 (Originally 08/31/2020)   Zoster Vaccines- Shingrix (1 of 2) 12/31/2021 (Originally 07/31/1964)   INFLUENZA VACCINE  04/20/2022 (Originally 08/19/2021)   Pneumonia Vaccine 66+ Years old (1 - PCV) 10/02/2022 (Originally 07/30/2009)   TETANUS/TDAP  10/02/2022 (Originally  07/31/1964)   Hepatitis C Screening  Completed   HPV VACCINES  Aged Out   COLONOSCOPY (Pts 45-65yr Insurance coverage will need to be confirmed)  Discontinued    Health Maintenance  There are no preventive care reminders to display for this patient.  Colorectal cancer screening: No longer required.    Additional Screening:  Hepatitis C Screening:  Completed 02/26/16  Vision Screening: Recommended annual ophthalmology exams for early detection of glaucoma and other disorders of the eye. Is the patient up to date with their annual eye exam?  Yes  Who is the provider or what is the name of the office in which the patient attends annual eye exams? [facility]  If pt is not established with a provider, would they like to be referred to a provider to establish care? No .   Dental Screening: Recommended annual dental exams for proper oral hygiene  Community Resource Referral / Chronic Care Management: CRR required this visit?  No   CCM required this visit?  No      Plan:     I have personally reviewed and noted the following in the patient's chart:   Medical and social history Use of alcohol, tobacco or illicit drugs  Current medications and supplements including opioid prescriptions. Patient is not currently taking opioid prescriptions. Functional ability and status Nutritional status Physical activity Advanced directives List of other physicians Hospitalizations, surgeries, and ER visits in previous 12 months Vitals Screenings to include cognitive, depression, and falls Referrals and appointments  In addition, I have reviewed and discussed with patient certain preventive protocols, quality metrics, and best practice recommendations. A written personalized care plan for preventive services as well as general preventive health recommendations were provided to patient.     TWillette Brace LPN   [id number]  Nurse Notes: none

## 2021-10-10 ENCOUNTER — Ambulatory Visit: Payer: Medicare Other

## 2021-10-10 DIAGNOSIS — M1711 Unilateral primary osteoarthritis, right knee: Secondary | ICD-10-CM | POA: Diagnosis not present

## 2021-10-10 DIAGNOSIS — Z23 Encounter for immunization: Secondary | ICD-10-CM | POA: Diagnosis not present

## 2021-10-17 DIAGNOSIS — M1711 Unilateral primary osteoarthritis, right knee: Secondary | ICD-10-CM | POA: Diagnosis not present

## 2021-10-24 ENCOUNTER — Other Ambulatory Visit: Payer: Self-pay | Admitting: Family Medicine

## 2021-10-24 DIAGNOSIS — M19011 Primary osteoarthritis, right shoulder: Secondary | ICD-10-CM | POA: Diagnosis not present

## 2021-10-24 DIAGNOSIS — M1711 Unilateral primary osteoarthritis, right knee: Secondary | ICD-10-CM | POA: Diagnosis not present

## 2021-12-01 ENCOUNTER — Telehealth: Payer: Self-pay | Admitting: Interventional Cardiology

## 2021-12-01 DIAGNOSIS — M25511 Pain in right shoulder: Secondary | ICD-10-CM | POA: Diagnosis not present

## 2021-12-01 NOTE — Telephone Encounter (Signed)
Pt c/o medication issue:  1. Name of Medication: Praluent  2. How are you currently taking this medication (dosage and times per day)?   3. Are you having a reaction (difficulty breathing--STAT)?   4. What is your medication issue? Patient said he received a letter from his insurance as of 01-19-22- his insurance will not pay for his Praluent-They said Repatha might be alright if the doctor think so- if not he can work them on making an exception to continue taking the Praluent

## 2021-12-03 ENCOUNTER — Other Ambulatory Visit: Payer: Self-pay | Admitting: *Deleted

## 2021-12-03 ENCOUNTER — Telehealth: Payer: Self-pay | Admitting: Family Medicine

## 2021-12-03 MED ORDER — VALACYCLOVIR HCL 1 G PO TABS: 1000.0000 mg | ORAL_TABLET | ORAL | 0 refills | Status: DC

## 2021-12-03 NOTE — Telephone Encounter (Signed)
Rx send to pharmacy  

## 2021-12-03 NOTE — Telephone Encounter (Signed)
  LAST APPOINTMENT DATE:  10/01/21  NEXT APPOINTMENT DATE: 10/02/22  MEDICATION:  valACYclovir (VALTREX) 1000 MG tablet   Is the patient out of medication? Yes  PHARMACY: [id number] [place], [place] - [id number]: [phone] Fax: [phone] Patient states: -He was informed pharmacy tried reaching out for a refill of medication but received no response -He would like PCP to send in a couple of refills if possible

## 2021-12-04 ENCOUNTER — Other Ambulatory Visit: Payer: Self-pay | Admitting: Physician Assistant

## 2021-12-04 DIAGNOSIS — M19011 Primary osteoarthritis, right shoulder: Secondary | ICD-10-CM | POA: Diagnosis not present

## 2021-12-05 ENCOUNTER — Other Ambulatory Visit: Payer: Self-pay | Admitting: *Deleted

## 2021-12-10 ENCOUNTER — Ambulatory Visit
Admission: RE | Admit: 2021-12-10 | Discharge: 2021-12-10 | Disposition: A | Discharge status: Home / Self Care | Payer: Medicare Other | Source: Ambulatory Visit | Attending: Physician Assistant | Admitting: Physician Assistant

## 2021-12-10 DIAGNOSIS — M7531 Calcific tendinitis of right shoulder: Secondary | ICD-10-CM | POA: Diagnosis not present

## 2021-12-10 DIAGNOSIS — M19011 Primary osteoarthritis, right shoulder: Secondary | ICD-10-CM

## 2021-12-10 DIAGNOSIS — Z01818 Encounter for other preprocedural examination: Secondary | ICD-10-CM | POA: Diagnosis not present

## 2021-12-10 DIAGNOSIS — G8929 Other chronic pain: Secondary | ICD-10-CM | POA: Diagnosis not present

## 2021-12-22 ENCOUNTER — Ambulatory Visit (HOSPITAL_COMMUNITY)
Admission: RE | Admit: 2021-12-22 | Discharge: 2021-12-22 | Disposition: A | Discharge status: Home / Self Care | Payer: Medicare Other | Source: Ambulatory Visit | Attending: Family Medicine | Admitting: Family Medicine

## 2021-12-22 ENCOUNTER — Ambulatory Visit (INDEPENDENT_AMBULATORY_CARE_PROVIDER_SITE_OTHER): Payer: Medicare Other | Admitting: Family Medicine

## 2021-12-22 ENCOUNTER — Encounter: Payer: Self-pay | Admitting: Family Medicine

## 2021-12-22 VITALS — BP 136/85 | HR 75 | Temp 97.6°F | Ht 74.0 in | Wt 219.0 lb

## 2021-12-22 DIAGNOSIS — R600 Localized edema: Secondary | ICD-10-CM

## 2021-12-22 MED ORDER — CEPHALEXIN 500 MG PO CAPS: 500.0000 mg | ORAL_CAPSULE | Freq: Two times a day (BID) | ORAL | 0 refills | Status: DC

## 2021-12-22 NOTE — Progress Notes (Signed)
Subjective:     Patient ID: Jonathan Holden, male    DOB: [birth year], 76 y.o.   MRN: [id number]  Chief Complaint  Patient presents with   Leg Swelling    Swelling in right lower leg that started 1 week ago    HPI Swelling RLE for months-will be close to normal and then worse.  Getting more red.  Now, into foot for 1 wk which is new.  No injury.  No sob.   Not on blood thinner. No f/c. Bruising from dogs on legs.   L hip replaced in July.   When on blood thinner in past, "polyps in colon bled"   Health Maintenance Due  Topic Date Due   DTaP/Tdap/Td (1 - Tdap) Never done    Past Medical History:  Diagnosis Date   Acute lower GI bleeding 03/19/2016   Arthritis    Atrial fibrillation with RVR ([place]) 02/21/2016   Coronary artery disease    Diverticulosis 04/18/2016   Dysrhythmia    AFIB   Endocarditis of native valve    Former smoker    H/O gastric ulcer    History of MI (myocardial infarction) 1992   Hyperlipidemia    Hypertension    Infection of intervertebral disc ([place])    Insomnia 04/18/2016   Left retinal detachment 03/18/2015   Loose body of right shoulder    Myocardial infarction ([place])    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   Pseudophakia of left eye 05/22/2013   Pseudophakia of right eye 06/05/2013   S/P coronary angioplasty    Streptococcal bacteremia     Past Surgical History:  Procedure Laterality Date   CARDIOVASCULAR STRESS TEST  08/22/2008   CORONARY ANGIOPLASTY  1992   ERCP W/ SPHICTEROTOMY  12/15/2002   LAPAROSCOPIC CHOLECYSTECTOMY  12/16/2002   LUMBAR LAMINECTOMY     L5   RIGHT SHOULDER SURGERY      TEE WITHOUT CARDIOVERSION N/A 02/28/2016   Procedure: TRANSESOPHAGEAL ECHOCARDIOGRAM (TEE);  Surgeon: Fay Records, MD;  Location: [place];  Service: Cardiovascular;  Laterality: N/A;   TOTAL HIP ARTHROPLASTY Left 07/23/2021   Procedure: TOTAL HIP ARTHROPLASTY ANTERIOR APPROACH;  Surgeon: Gaynelle Arabian, MD;  [facility];  Service: Orthopedics;  Laterality: Left;    Outpatient Medications Prior to Visit  Medication Sig Dispense Refill   amiodarone (PACERONE) 200 MG tablet TAKE 1 TABLET (200 MG TOTAL) BY MOUTH DAILY. \ 90 tablet 3   Carboxymethylcellul-Glycerin (LUBRICATING EYE DROPS OP) Place 1 drop into both eyes daily.     clobetasol ointment (TEMOVATE) 0.05 % APPLY TO AFFECTED AREA TWICE A DAY (Patient taking differently: Apply 1 Application topically daily as needed (eczema).) 30 g 2   ketoconazole (NIZORAL) 2 % shampoo APPLY 1 3 TIMES A WEEK TO SCALP. LET SIT 10 MIN THEN RINSE OUT. Strength: 2 % 120 mL 3   lisinopril (ZESTRIL) 10 MG tablet TAKE 1 TABLET BY MOUTH DAILY. PLEASE KEEP UPCOMING APPOINTMENT WITH DR. Tamala Julian IN APRIL 2023 90 tablet 3   methocarbamol (ROBAXIN) 500 MG tablet Take 1 tablet (500 mg total) by mouth every 6 (six) hours as needed for muscle spasms. 40 tablet 0   metoprolol succinate (TOPROL-XL) 25 MG 24 hr tablet TAKE 1 TABLET (25 MG TOTAL) BY MOUTH DAILY. PLEASE KEEP UPCOMING APPT IN APRIL 90 tablet 3   PRALUENT 150 MG/ML SOAJ INJECT 1 PEN INTO THE SKIN EVERY 14 (FOURTEEN) DAYS. 6 mL 3   traMADol (ULTRAM) 50 MG tablet  Take 1-2 tablets (50-100 mg total) by mouth every 6 (six) hours as needed for moderate pain. 40 tablet 0   triamcinolone cream (KENALOG) 0.1 % Apply 1 Application topically daily as needed (dry skin).     valACYclovir (VALTREX) 1000 MG tablet Take 1-2 tablets (1,000-2,000 mg total) by mouth See admin instructions. Take 2000 mg at the onset of a flare then take 1000 mg twice daily as needed for fever blisters (Patient taking differently: Take 2,000 mg by mouth 2 (two) times daily as needed (fever blisters).) 84 tablet 0   HYDROcodone-acetaminophen (NORCO/VICODIN) 5-325 MG tablet Take 1-2 tablets by mouth every 6 (six) hours as needed for severe pain. (Patient not taking: Reported on 12/22/2021) 42 tablet 0   No facility-administered medications prior to visit.    Allergies   Allergen Reactions   Eliquis [Apixaban] Other (See Comments)    Recurrent GI bleeding occurred from diverticulosis in the setting of anticoagulation therapy for atrial fibrillation.   Food Other (See Comments)    Pt is allergic to melons caused scratchy throat    Glutamic Acid Other (See Comments)    Pt is allergic to melons caused scratchy throat    Grass Pollen(K-O-R-T-Swt Vern)     Sneezing    Ciprofloxacin Hives   Pollen Extract Other (See Comments)    Sneezing    ROS neg/noncontributory except as noted HPI/below      Objective:     BP 136/85   Pulse 75   Temp 97.6 F (36.4 C) (Temporal)   Ht '6\' 2"'$  (1.88 m)   Wt 219 lb (99.3 kg)   SpO2 99%   BMI 28.12 kg/m  Wt Readings from Last 3 Encounters:  12/22/21 219 lb (99.3 kg)  10/01/21 212 lb 9.6 oz (96.4 kg)  09/10/21 206 lb (93.4 kg)    Physical Exam   Gen: WDWN NAD HEENT: NCAT, conjunctiva not injected, sclera nonicteric CARDIAC: RRR, S1S2+, no murmur.  LUNGS: CTAB. No wheezes EXT:  BLE-varicose veins.  No edema LLE but ruddy leg RLE-larger than L-+edema R lower leg/foot 2+.but not uniform.  Bruising esp medial side of calf.  Redness/warmth. Neg calf tenderness.  Neg Homan's. Healing wound shin on R-lower MSK: no gross abnormalities.  NEURO: A&O x3.  CN II-XII intact.  PSYCH: normal mood. Good eye contact     Assessment & Plan:   Problem List Items Addressed This Visit   None Visit Diagnoses     Edema of right lower leg    -  Primary   Relevant Orders   VAS [place] LOWER EXTREMITY VENOUS (DVT)      Edema RLE.  Acute but >49mo  Worse past 1+wk.  Some cellulitis-keflex 500bid.  Send for stat venous doppler.  Will call pt w/results/plan.   Received call from doppler tech-age indeterminate clot. But also bilat DVT's.  DVT clinic closed so he will f/u.   Pt w/h/o bleed. - gastric ulcer, divertic bleed on eliquis Discussed w/pt.  His discussion w/vasc was more of a "chronic" thing and given his h/o of bleed  requiring 6uprbc, he declines blood thinner but wants to be seen in the DVT clinic to discuss options.  We discussed risk of PE and he is aware life threatening, even death.  He still declines blood thinnner but will go to ER if SOB.   Was very appreciative of call.      Meds ordered this encounter  Medications   cephALEXin (KEFLEX) 500 MG capsule  Sig: Take 1 capsule (500 mg total) by mouth 2 (two) times daily. Take for 7 days    Dispense:  20 capsule    Refill:  0    Wellington Hampshire, MD

## 2021-12-22 NOTE — Patient Instructions (Signed)
[place]

## 2021-12-23 ENCOUNTER — Other Ambulatory Visit (HOSPITAL_COMMUNITY): Payer: Self-pay

## 2021-12-23 ENCOUNTER — Ambulatory Visit (HOSPITAL_COMMUNITY)
Admission: RE | Admit: 2021-12-23 | Discharge: 2021-12-23 | Disposition: A | Discharge status: Home / Self Care | Payer: Medicare Other | Source: Ambulatory Visit | Attending: Vascular Surgery | Admitting: Vascular Surgery

## 2021-12-23 ENCOUNTER — Telehealth: Payer: Self-pay | Admitting: Interventional Cardiology

## 2021-12-23 VITALS — BP 137/94 | HR 90 | Ht 74.0 in | Wt 219.0 lb

## 2021-12-23 DIAGNOSIS — I82413 Acute embolism and thrombosis of femoral vein, bilateral: Secondary | ICD-10-CM

## 2021-12-23 DIAGNOSIS — I82501 Chronic embolism and thrombosis of unspecified deep veins of right lower extremity: Secondary | ICD-10-CM | POA: Diagnosis not present

## 2021-12-23 NOTE — Patient Instructions (Addendum)
-  Elevate your leg the way Dr. Scot Dock explained to you. Wear compression stockings.  -Go to the emergency room if emergent signs and symptoms of new clot occur (new or worse swelling and pain in an arm or leg, shortness of breath, chest pain, fast or irregular heartbeats, lightheadedness, dizziness, fainting, coughing up blood) or if you experience a significant color change (pale or blue) in the extremity that has the DVT.  -We recommend you wear knee high compression stockings (15-20 mmHg) as long as you are having swelling or pain. Be sure to purchase the correct size and take them off at night. Measurements taken today: ankle: 9.5 inches, calf: 15 inches, thigh: 19 inches  If you have any questions, please call [phone].  If you are having an emergency, call 911 or present to the nearest emergency room.   What is a DVT?  -Deep vein thrombosis (DVT) is a condition in which a blood clot forms in a vein of the deep venous system which can occur in the lower leg, thigh, pelvis, arm, or neck. This condition is serious and can be life-threatening if the clot travels to the arteries of the lungs and causing a blockage (pulmonary embolism, PE). A DVT can also damage veins in the leg, which can lead to long-term venous disease, leg pain, swelling, discoloration, and ulcers or sores (post-thrombotic syndrome).  -Treatment may include taking an anticoagulant medication to prevent more clots from forming and the current clot from growing, wearing compression stockings, and/or surgical procedures to remove or dissolve the clot.

## 2021-12-23 NOTE — Telephone Encounter (Signed)
Patient called stating he went to his PCP yesterday for a lower right leg that is swollen and red.  PCP sent him to vascular imaging, and they told him he has blood clots in both legs.  Patient wants for Dr. Tamala Julian to tell him how he should proceed.

## 2021-12-23 NOTE — Progress Notes (Signed)
His ultrasound shows blood clots.  He has declined anticoagulation.  Can we please make sure that he is scheduled to see the vascular specialists to discuss management options?

## 2021-12-23 NOTE — Patient Instructions (Signed)
SURGICAL WAITING ROOM VISITATION Patients having surgery or a procedure may have no more than 2 support people in the waiting area - these visitors may rotate.   Children under the age of 40 must have an adult with them who is not the patient. If the patient needs to stay at the hospital during part of their recovery, the visitor guidelines for inpatient rooms apply. Pre-op nurse will coordinate an appropriate time for 1 support person to accompany patient in pre-op.  This support person may not rotate.    Please refer to the [facility] website for the visitor guidelines for Inpatients (after your surgery is over and you are in a regular room).      Your procedure is scheduled on: 01-09-22   Report to [facility] Main Entrance    Report to admitting at 8:30 AM   Call this number if you have problems the morning of surgery [phone]   Do not eat food :After Midnight.   After Midnight you may have the following liquids until 8:00 AM DAY OF SURGERY  Water Non-Citrus Juices (without pulp, NO RED) Carbonated Beverages Black Coffee (NO MILK/CREAM OR CREAMERS, sugar ok)  Clear Tea (NO MILK/CREAM OR CREAMERS, sugar ok) regular and decaf                             Plain Jell-O (NO RED)                                           Fruit ices (not with fruit pulp, NO RED)                                     Popsicles (NO RED)                                                               Sports drinks like Gatorade (NO RED)                   The day of surgery:  Drink ONE (1) Pre-Surgery Clear Ensure at 8:00 AM the morning of surgery. Drink in one sitting. Do not sip.  This drink was given to you during your hospital  pre-op appointment visit. Nothing else to drink after completing the Pre-Surgery Clear Ensure           If you have questions, please contact your surgeon's office.   FOLLOW  ANY ADDITIONAL PRE OP INSTRUCTIONS YOU RECEIVED FROM YOUR SURGEON'S OFFICE!!!      Oral Hygiene is also important to reduce your risk of infection.                                    Remember - BRUSH YOUR TEETH THE MORNING OF SURGERY WITH YOUR REGULAR TOOTHPASTE   Do NOT smoke after Midnight   Take these medicines the morning of surgery with A SIP OF WATER: Amiodarone Metoprolol Tramadol if needed  You may not have any metal on your body including jewelry, and body piercing             Do not wear  lotions, powders, cologne, or deodorant              Men may shave face and neck.   Do not bring valuables to the hospital. [place].   Contacts, dentures or bridgework may not be worn into surgery.  DO NOT [place]. PHARMACY WILL DISPENSE MEDICATIONS LISTED ON YOUR MEDICATION LIST TO YOU DURING YOUR ADMISSION [place]!    Patients discharged on the day of surgery will not be allowed to drive home.  Someone NEEDS to stay with you for the first 24 hours after anesthesia.               Please read over the following fact sheets you were given: IF [place]  If you received a COVID test during your pre-op visit  it is requested that you wear a mask when out in public, stay away from anyone that may not be feeling well and notify your surgeon if you develop symptoms. If you test positive for Covid or have been in contact with anyone that has tested positive in the last 10 days please notify you surgeon.   [place]- Preparing for Total Shoulder Arthroplasty    Before surgery, you can play an important role. Because skin is not sterile, your skin needs to be as free of germs as possible. You can reduce the number of germs on your skin by using the following products. Benzoyl Peroxide Gel Reduces the number of germs present on the skin Applied twice a day to shoulder area starting two days  before surgery    ==================================================================  Please follow these instructions carefully:  BENZOYL PEROXIDE 5% GEL  Please do not use if you have an allergy to benzoyl peroxide.   If your skin becomes reddened/irritated stop using the benzoyl peroxide.  Starting two days before surgery, apply as follows: Apply benzoyl peroxide in the morning and at night. Apply after taking a shower. If you are not taking a shower clean entire shoulder front, back, and side along with the armpit with a clean wet washcloth.  Place a quarter-sized dollop on your shoulder and rub in thoroughly, making sure to cover the front, back, and side of your shoulder, along with the armpit.   2 days before ____ AM   ____ PM              1 day before ____ AM   ____ PM                         Do this twice a day for two days.  (Last application is the night before surgery, AFTER using the CHG soap as described below).  Do NOT apply benzoyl peroxide gel on the day of surgery.   - Preparing for Surgery Before surgery, you can play an important role.  Because skin is not sterile, your skin needs to be as free of germs as possible.  You can reduce the number of germs on your skin by washing with CHG (chlorahexidine gluconate) soap before surgery.  CHG is an antiseptic cleaner which kills germs and bonds with the skin to continue killing germs even  after washing. Please DO NOT use if you have an allergy to CHG or antibacterial soaps.  If your skin becomes reddened/irritated stop using the CHG and inform your nurse when you arrive at Short Stay. Do not shave (including legs and underarms) for at least 48 hours prior to the first CHG shower.  You may shave your face/neck.  Please follow these instructions carefully:  1.  Shower with CHG Soap the night before surgery and the  morning of surgery.  2.  If you choose to wash your hair, wash your hair first as usual with your  normal  shampoo.  3.  After you shampoo, rinse your hair and body thoroughly to remove the shampoo.                             4.  Use CHG as you would any other liquid soap.  You can apply chg directly to the skin and wash.  Gently with a scrungie or clean washcloth.  5.  Apply the CHG Soap to your body ONLY FROM THE NECK DOWN.   Do   not use on face/ open                           Wound or open sores. Avoid contact with eyes, ears mouth and   genitals (private parts).                       Wash face,  Genitals (private parts) with your normal soap.             6.  Wash thoroughly, paying special attention to the area where your    surgery  will be performed.  7.  Thoroughly rinse your body with warm water from the neck down.  8.  DO NOT shower/wash with your normal soap after using and rinsing off the CHG Soap.                9.  Pat yourself dry with a clean towel.            10.  Wear clean pajamas.            11.  Place clean sheets on your bed the night of your first shower and do not  sleep with pets. Day of Surgery : Do not apply any lotions/deodorants the morning of surgery.  Please wear clean clothes to the hospital/surgery center.  FAILURE TO FOLLOW THESE INSTRUCTIONS MAY RESULT IN THE CANCELLATION OF YOUR SURGERY  PATIENT SIGNATURE_________________________________  NURSE SIGNATURE__________________________________  ________________________________________________________________________      Jonathan Holden  An incentive spirometer is a tool that can help keep your lungs clear and active. This tool measures how well you are filling your lungs with each breath. Taking long deep breaths may help reverse or decrease the chance of developing breathing (pulmonary) problems (especially infection) following: A long period of time when you are unable to move or be active. BEFORE THE PROCEDURE  If the spirometer includes an indicator to show your best effort, your nurse or  respiratory therapist will set it to a desired goal. If possible, sit up straight or lean slightly forward. Try not to slouch. Hold the incentive spirometer in an upright position. INSTRUCTIONS FOR USE  Sit on the edge of your bed if possible, or sit up as far as you can in bed or  on a chair. Hold the incentive spirometer in an upright position. Breathe out normally. Place the mouthpiece in your mouth and seal your lips tightly around it. Breathe in slowly and as deeply as possible, raising the piston or the ball toward the top of the column. Hold your breath for 3-5 seconds or for as long as possible. Allow the piston or ball to fall to the bottom of the column. Remove the mouthpiece from your mouth and breathe out normally. Rest for a few seconds and repeat Steps 1 through 7 at least 10 times every 1-2 hours when you are awake. Take your time and take a few normal breaths between deep breaths. The spirometer may include an indicator to show your best effort. Use the indicator as a goal to work toward during each repetition. After each set of 10 deep breaths, practice coughing to be sure your lungs are clear. If you have an incision (the cut made at the time of surgery), support your incision when coughing by placing a pillow or rolled up towels firmly against it. Once you are able to get out of bed, walk around indoors and cough well. You may stop using the incentive spirometer when instructed by your caregiver.  RISKS AND COMPLICATIONS Take your time so you do not get dizzy or light-headed. If you are in pain, you may need to take or ask for pain medication before doing incentive spirometry. It is harder to take a deep breath if you are having pain. AFTER USE Rest and breathe slowly and easily. It can be helpful to keep track of a log of your progress. Your caregiver can provide you with a simple table to help with this. If you are using the spirometer at home, follow these  instructions: [place] IF:  You are having difficultly using the spirometer. You have trouble using the spirometer as often as instructed. Your pain medication is not giving enough relief while using the spirometer. You develop fever of 100.5 F (38.1 C) or higher. SEEK IMMEDIATE MEDICAL CARE IF:  You cough up bloody sputum that had not been present before. You develop fever of 102 F (38.9 C) or greater. You develop worsening pain at or near the incision site. MAKE SURE YOU:  Understand these instructions. Will watch your condition. Will get help right away if you are not doing well or get worse. Document Released: 05/18/2006 Document Revised: 03/30/2011 Document Reviewed: 07/19/2006 [facility] Patient Information 2014 [place], [place].   ________________________________________________________________________

## 2021-12-23 NOTE — Progress Notes (Signed)
COVID Vaccine Completed:  Yes  Date of COVID positive in last 90 days:  PCP - Dimas Chyle, MD Cardiologist - Daneen Schick, MD Electrophysiologist - Allegra Lai, MD  Chest x-ray -  EKG - 02-04-21 Epic Stress Test -  ECHO - 2018 Epic Cardiac Cath -  Pacemaker/ICD device last checked: Spinal Cord Stimulator: Long Term Monitor - 2023 Epic  Bowel Prep -   Sleep Study -  CPAP -   Fasting Blood Sugar -  Checks Blood Sugar _____ times a day  Last dose of GLP1 agonist-  N/A GLP1 instructions:  N/A   Last dose of SGLT-2 inhibitors-  N/A SGLT-2 instructions: N/A   Blood Thinner Instructions: Aspirin Instructions: Last Dose:  Activity level:  Can go up a flight of stairs and perform activities of daily living without stopping and without symptoms of chest pain or shortness of breath.  Able to exercise without symptoms  Unable to go up a flight of stairs without symptoms of     Anesthesia review: Afib, Hx of MI, hx of angioplasty,  HTN,   Patient denies shortness of breath, fever, cough and chest pain at PAT appointment  Patient verbalized understanding of instructions that were given to them at the PAT appointment. Patient was also instructed that they will need to review over the PAT instructions again at home before surgery.

## 2021-12-23 NOTE — Addendum Note (Signed)
Encounter addended by: Angelia Mould, MD on: 12/23/2021 4:12 PM  Actions taken: Clinical Note Signed

## 2021-12-23 NOTE — Telephone Encounter (Signed)
Returned call to patient.  Patient states he saw his PCP Dr. Jerline Pain yesterday for swelling/redness in right leg. He had an ultrasound performed to check for DVT (study available for review in chart). Blood clots in both legs, patient was seen by vascular surgeon today (12/23/21) Dr. Scot Dock to assess. Patient states Dr. Scot Dock told him these appeared to be old, not acute and to wear compression stockings.  Patient would like for Dr. Tamala Julian to reviewed ultrasound study and note from Dr. Scot Dock (when available) and would like to know if Dr. Tamala Julian agrees or has any other recommendations.  Will forward to Dr. Tamala Julian to review and advise.  Patient adds: he would like to let Dr. Tamala Julian know he will miss him greatly and he appreciates the care Dr. Tamala Julian has provided and wishes him well in retirement.

## 2021-12-23 NOTE — Progress Notes (Addendum)
DVT Clinic Note  Name: Jonathan Holden     MRN: [id number]     DOB: [birth year]     Sex: male  PCP: Vivi Barrack, MD  Today's Visit: Visit Information: Initial Visit  Referred to DVT Clinic by: Dr. Cherlynn Kaiser (family medicine)  Referred to CPP by: Dr. Scot Dock Reason for referral:  Chief Complaint  Patient presents with   DVT   HISTORY OF PRESENT ILLNESS:  Jonathan Holden is a 76 y.o. male with PMH of HTN, HLD, diastolic HF, MI s/p right coronary angioplasty (1992), afib, and endocarditis of native valve, who presents after diagnosis of DVT for medication management. Patient reports he has had intermittent RLE swelling for the past few months but started having worsening swelling, redness, and tingling in his lower leg and foot for the past week which was new. No hx blood clots in his family. He had a left hip replacement 07/23/21 but has had no other procedures since. He reports he was quite immobile for about two weeks after the surgery and slowly started moving again. He also has a history of gastric ulcers and recurrent diverticular bleeding. He was placed on Eliquis in 2018 after diagnosis of afib but had GI bleeding requiring 6u pRBCs. Cardiology has held antiplatelets and anticoagulation as a result. He reports he has not had any diverticular bleeding since that time. He was supposed to have a colonoscopy a few months ago but they were not able to proceed due to bowels not being prepped completely, reports this needs to be rescheduled. He has arthritis in both knees and gets injections in his R knee. He has a shoulder replacement scheduled 01/09/22.    Positive Thrombotic Risk Factors: Bed rest >72 hours within 3 month, Older Age (L hip replacement 07/23/21) Bleeding Risk Factors: Age >65 years, Previous bleeding  Negative Thrombotic Risk Factors: Previous VTE, Recent trauma (within 3 months), Sedentary journey lasting >8 hours within 4 weeks, Paralysis, paresis, or recent plaster cast  immobilization of lower extremity, Central venous catheterization, Pregnancy, Estrogen therapy, Recent cesarean section (within 3 months), Testosterone therapy, Obesity, Within 6 weeks postpartum, Erythropoiesis-stimulating agent, Non-malignant, chronic inflammatory condition, Known thrombophilic condition, Smoking, Active cancer, Recent COVID diagnosis (within 3 months)  Rx Insurance Coverage: Medicare Rx Affordability: Patient is currently in the donut hole. Cost of 30 day supply of Eliquis would be $151.46, Xarelto $146.52, Lovenox $280.86.  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   Pseudophakia of left eye 05/22/2013   Pseudophakia of right eye 06/05/2013   S/P coronary angioplasty    Streptococcal bacteremia     Past Surgical History:  Procedure Laterality Date   CARDIOVASCULAR STRESS TEST  08/22/2008   CORONARY ANGIOPLASTY  1992   ERCP W/ SPHICTEROTOMY  12/15/2002   LAPAROSCOPIC CHOLECYSTECTOMY  12/16/2002   LUMBAR LAMINECTOMY  L5   RIGHT SHOULDER SURGERY      TEE WITHOUT CARDIOVERSION N/A 02/28/2016   Procedure: TRANSESOPHAGEAL ECHOCARDIOGRAM (TEE);  Surgeon: Fay Records, MD;  Location: [facility] [place];  Service: Cardiovascular;  Laterality: N/A;   TOTAL HIP ARTHROPLASTY Left 07/23/2021   Procedure: TOTAL HIP ARTHROPLASTY ANTERIOR APPROACH;  Surgeon: Gaynelle Arabian, MD;  Location: [facility];   Service: Orthopedics;  Laterality: Left;    Social History   Socioeconomic History   Marital status: Divorced    Spouse name: Not on file   Number of children: Not on file   Years of education: Not on file   Highest education level: Not on file  Occupational History   Occupation: sales  Tobacco Use   Smoking status: Former    Packs/day: 2.00    Years: 20.00    Total pack years: 40.00    Types: Cigarettes    Quit date: 03/27/1990    Years since quitting: 31.7   Smokeless tobacco: Never  Vaping Use   Vaping Use: Never used  Substance and Sexual Activity   Alcohol use: Yes    Comment: ONLY DRINK WINE DAILY   Drug use: No   Sexual activity: Not Currently  Other Topics Concern   Not on file  Social History Narrative   Not on file   Social Determinants of Health   Financial Resource Strain: Low Risk  (10/09/2021)   Overall Financial Resource Strain (CARDIA)    Difficulty of Paying Living Expenses: Not hard at all  Food Insecurity: No Food Insecurity (10/09/2021)   Hunger Vital Sign    Worried About Running Out of Food in the Last Year: Never true    Ran Out of Food in the Last Year: Never true  Transportation Needs: No Transportation Needs (10/09/2021)   PRAPARE - Hydrologist (Medical): No    Lack of Transportation (Non-Medical): No  Physical Activity: Insufficiently Active (10/09/2021)   Exercise Vital Sign    Days of Exercise per Week: 2 days    Minutes of Exercise per Session: 40 min  Stress: No Stress Concern Present (10/09/2021)   [place]    Feeling of Stress : Not at all  Social Connections: Socially Isolated (10/09/2021)   Social Connection and Isolation Panel [NHANES]    Frequency of Communication with Friends and Family: More than three times a week    Frequency of Social Gatherings with Friends and Family: More than three times a week    Attends Religious Services:  Never    Marine scientist or Organizations: No    Attends Archivist Meetings: Never    Marital Status: Divorced  Human resources officer Violence: Not At Risk (10/09/2021)   Humiliation, Afraid, Rape, and Kick questionnaire    Fear of Current or Ex-Partner: No    Emotionally Abused: No    Physically Abused: No    Sexually Abused: No    Family History  Problem Relation Age of Onset   Other Mother        AGE 61 HEALTHY   Heart attack Father 69       2 MI   Heart disease Maternal Grandfather    Heart disease Paternal Grandfather    Emphysema Other    Other Brother     Allergies as of 12/23/2021 - Review Complete 12/23/2021  Allergen Reaction Noted   Eliquis [apixaban] Other (See Comments) 04/18/2016   Food Other (  See Comments) 03/17/2016   Glutamic acid Other (See Comments) 03/17/2016   Grass pollen(k-o-r-t-swt vern)  09/27/2020   Ciprofloxacin Hives 02/17/2016   Pollen extract Other (See Comments) 03/18/2015    Current Outpatient Medications on File Prior to Encounter  Medication Sig Dispense Refill   amiodarone (PACERONE) 200 MG tablet TAKE 1 TABLET (200 MG TOTAL) BY MOUTH DAILY. \ 90 tablet 3   Carboxymethylcellul-Glycerin (LUBRICATING EYE DROPS OP) Place 1 drop into both eyes daily.     clobetasol ointment (TEMOVATE) 0.05 % APPLY TO AFFECTED AREA TWICE A DAY (Patient taking differently: Apply 1 Application topically daily as needed (eczema).) 30 g 2   ketoconazole (NIZORAL) 2 % shampoo APPLY 1 3 TIMES A WEEK TO SCALP. LET SIT 10 MIN THEN RINSE OUT. Strength: 2 % 120 mL 3   lisinopril (ZESTRIL) 10 MG tablet TAKE 1 TABLET BY MOUTH DAILY. PLEASE KEEP UPCOMING APPOINTMENT WITH DR. Tamala Julian IN APRIL 2023 90 tablet 3   methocarbamol (ROBAXIN) 500 MG tablet Take 1 tablet (500 mg total) by mouth every 6 (six) hours as needed for muscle spasms. 40 tablet 0   metoprolol succinate (TOPROL-XL) 25 MG 24 hr tablet TAKE 1 TABLET (25 MG TOTAL) BY MOUTH DAILY. PLEASE KEEP UPCOMING APPT  IN APRIL 90 tablet 3   PRALUENT 150 MG/ML SOAJ INJECT 1 PEN INTO THE SKIN EVERY 14 (FOURTEEN) DAYS. 6 mL 3   traMADol (ULTRAM) 50 MG tablet Take 1-2 tablets (50-100 mg total) by mouth every 6 (six) hours as needed for moderate pain. 40 tablet 0   triamcinolone cream (KENALOG) 0.1 % Apply 1 Application topically daily as needed (dry skin).     valACYclovir (VALTREX) 1000 MG tablet Take 1-2 tablets (1,000-2,000 mg total) by mouth See admin instructions. Take 2000 mg at the onset of a flare then take 1000 mg twice daily as needed for fever blisters (Patient taking differently: Take 2,000 mg by mouth 2 (two) times daily as needed (fever blisters).) 84 tablet 0   cephALEXin (KEFLEX) 500 MG capsule Take 1 capsule (500 mg total) by mouth 2 (two) times daily. Take for 7 days (Patient not taking: Reported on 12/23/2021) 20 capsule 0   No current facility-administered medications on file prior to encounter.   REVIEW OF SYSTEMS:  Review of Systems  Respiratory:  Negative for shortness of breath.   Cardiovascular:  Positive for leg swelling. Negative for chest pain.  Musculoskeletal:        No pain in either leg  Skin:  Positive for itching (RLE).  Neurological:  Positive for tingling (RLE).   PHYSICAL EXAMINATION:  Vitals:   12/23/21 1115  BP: (!) 137/94  Pulse: 90  SpO2: 100%  Weight: 219 lb (99.3 kg)  Height: '6\' 2"'$  (1.88 m)    Body mass index is 28.12 kg/m.  Physical Exam Vitals reviewed.  Cardiovascular:     Rate and Rhythm: Normal rate.  Pulmonary:     Effort: Pulmonary effort is normal.  Musculoskeletal:     Right lower leg: Edema (with warmth and redness) present.   Villalta Score for Post-Thrombotic Syndrome: Pain: Absent Cramps: Absent Heaviness: Absent Paresthesia: Mild Pruritus: Mild Pretibial Edema: Moderate Skin Induration: Absent Hyperpigmentation: Absent Redness: Moderate Venous Ectasia: Moderate Pain on calf compression: Absent Villalta Preliminary Score: 8 Is  venous ulcer present?: No If venous ulcer is present and score is <15, then 15 points total are assigned: Absent Villalta Total Score: 8  LABS:  CBC     Component Value  Date/Time   WBC 13.6 (H) 07/25/2021 0347   RBC 3.55 (L) 07/25/2021 0347   HGB 11.5 (L) 07/25/2021 0347   HGB 15.0 02/04/2021 1203   HCT 34.6 (L) 07/25/2021 0347   HCT 45.3 02/04/2021 1203   PLT 207 07/25/2021 0347   PLT 228 02/04/2021 1203   MCV 97.5 07/25/2021 0347   MCV 91 02/04/2021 1203   MCH 32.4 07/25/2021 0347   MCHC 33.2 07/25/2021 0347   RDW 14.1 07/25/2021 0347   RDW 14.0 02/04/2021 1203   LYMPHSABS 990 06/23/2016 1657   MONOABS 726 06/23/2016 1657   EOSABS 264 06/23/2016 1657   BASOSABS 0 06/23/2016 1657    Hepatic Function      Component Value Date/Time   PROT 7.1 02/04/2021 1203   ALBUMIN 4.7 02/04/2021 1203   AST 18 02/04/2021 1203   ALT 12 02/04/2021 1203   ALKPHOS 79 02/04/2021 1203   BILITOT 0.3 02/04/2021 1203   BILIDIR 0.10 06/16/2018 1011    Renal Function   Lab Results  Component Value Date   CREATININE 0.87 07/24/2021   CREATININE 0.88 07/15/2021   CREATININE 0.95 02/04/2021    CrCl cannot be calculated (Patient's most recent lab result is older than the maximum 21 days allowed.).   [facility] Vascular Lab Studies:  12/22/21 VAS [place] LOWER EXTREMITY VENOUS BILAT (DVT)  Summary:  RIGHT:  - Findings consistent with age indeterminate deep vein thrombosis  involving the right femoral vein, right popliteal vein, right posterior  tibial veins, and right peroneal veins.   LEFT:  - Findings consistent with age indeterminate deep vein thrombosis  involving the left femoral vein, left popliteal vein, and left posterior  tibial veins.  - Findings consistent with chronic deep vein thrombosis involving the left  common femoral vein, SF junction, and left proximal profunda vein.   ASSESSMENT: Location of DVT: Right femoral vein, Right popliteal vein, Right distal vein, Left femoral vein,  Left popliteal vein, Left distal vein Cause of DVT: provoked by a transient risk factor - likely s/p L hip surgery followed by prolonged immobility, however unable to tell how long clot has been there given chronic and age indeterminate nature. Discoloration of the leg likely venous insufficiency.   Dr. Scot Dock was paged and came to evaluate the patient. Please see his consult note for further details.   PLAN: -Patient contraindicated for anticoagulation given hx significant bleeding while on Eliquis for afib. Given chronic and age indeterminate nature of clot (not acute), patient is not indicated for IVC filter or thrombectomy. Patient counseled to elevate leg and wear compression stockings to help improve swelling.  -Educated patient to present to the ED if emergent signs and symptoms of new thrombosis occur. -Measured patient for compression stockings. -Counseled patient to wear compression stockings daily, removing at night.  Follow up: With PCP as needed. Patient has shoulder replacement scheduled for later this month, and he plans to call their office to determine whether that can still proceed as planned.   Rebbeca Paul, PharmD, [place], CPP Deep Vein Thrombosis Clinic Clinical Pharmacist Practitioner Office: [phone]  I have interviewed the patient and examined the patient. I agree with the findings above.  I reviewed the images and I do not see any evidence of acute clots.  Based on his exam he has evidence of chronic venous insufficiency.  Given that there is no acute clot I do not think he needs anticoagulation especially given his history of GI bleeding and he feels very strongly about  not resuming anticoagulation.  In addition given that there is no acute clot I would not recommend placement of an IVC filter.  Gae Gallop, MD 4:07 PM   Gae Gallop, MD

## 2021-12-25 ENCOUNTER — Other Ambulatory Visit: Payer: Self-pay

## 2021-12-25 ENCOUNTER — Encounter (HOSPITAL_COMMUNITY)
Admission: RE | Admit: 2021-12-25 | Discharge: 2021-12-25 | Disposition: A | Discharge status: Home / Self Care | Payer: Medicare Other | Source: Ambulatory Visit | Attending: Orthopedic Surgery | Admitting: Orthopedic Surgery

## 2021-12-25 ENCOUNTER — Encounter (HOSPITAL_COMMUNITY): Payer: Self-pay

## 2021-12-25 VITALS — BP 140/87 | HR 71 | Temp 97.6°F | Resp 16 | Ht 74.0 in | Wt 217.0 lb

## 2021-12-25 DIAGNOSIS — Z01818 Encounter for other preprocedural examination: Secondary | ICD-10-CM

## 2021-12-25 DIAGNOSIS — Z01812 Encounter for preprocedural laboratory examination: Secondary | ICD-10-CM | POA: Diagnosis not present

## 2021-12-25 DIAGNOSIS — I2510 Atherosclerotic heart disease of native coronary artery without angina pectoris: Secondary | ICD-10-CM | POA: Diagnosis not present

## 2021-12-25 HISTORY — DX: Unspecified malignant neoplasm of skin, unspecified: C44.90

## 2021-12-25 LAB — CBC
HCT: 43.7 % (ref 39.0–52.0)
Hemoglobin: 14.3 g/dL (ref 13.0–17.0)
MCH: 32.6 pg (ref 26.0–34.0)
MCHC: 32.7 g/dL (ref 30.0–36.0)
MCV: 99.5 fL (ref 80.0–100.0)
Platelets: 166 10*3/uL (ref 150–400)
RBC: 4.39 MIL/uL (ref 4.22–5.81)
RDW: 15.8 % — ABNORMAL HIGH (ref 11.5–15.5)
WBC: 7.8 10*3/uL (ref 4.0–10.5)
nRBC: 0 % (ref 0.0–0.2)

## 2021-12-25 LAB — BASIC METABOLIC PANEL
Anion gap: 8 (ref 5–15)
BUN: 19 mg/dL (ref 8–23)
CO2: 24 mmol/L (ref 22–32)
Calcium: 9.5 mg/dL (ref 8.9–10.3)
Chloride: 104 mmol/L (ref 98–111)
Creatinine, Ser: 1.11 mg/dL (ref 0.61–1.24)
GFR, Estimated: >60 mL/min (ref >60)
Glucose, Bld: 90 mg/dL (ref 70–99)
Potassium: 4.3 mmol/L (ref 3.5–5.1)
Sodium: 136 mmol/L (ref 135–145)

## 2021-12-25 LAB — SURGICAL PCR SCREEN
MRSA, PCR: NEGATIVE
Staphylococcus aureus: POSITIVE — AB

## 2021-12-25 NOTE — Progress Notes (Signed)
PCR results sent to Dr. Stann Mainland to review.

## 2021-12-26 ENCOUNTER — Other Ambulatory Visit: Payer: Self-pay | Admitting: Family Medicine

## 2021-12-30 ENCOUNTER — Other Ambulatory Visit: Payer: Self-pay | Admitting: Gastroenterology

## 2021-12-31 NOTE — Telephone Encounter (Signed)
Shared Dr. Thompson Caul response with patient via [place] message.

## 2022-01-08 ENCOUNTER — Telehealth: Payer: Self-pay | Admitting: Interventional Cardiology

## 2022-01-08 NOTE — Telephone Encounter (Addendum)
Spoke with pt - his insurance still covers Praluent through the end of December and then his formulary prefers Repatha starting in January.   Pt wishes to continue on Praluent next year if possible. He's been taking it since he was in the clinical trial before the med was even approved and has been tolerating it well with good LDL reduction. Advised pt that we can submit request in January for him to continue on Praluent, that insurance will likely deny it, and that we will likely need to appeal it. Will submit this in early January and follow up with pt once determination is made. He was appreciative for the assistance.

## 2022-01-08 NOTE — Telephone Encounter (Signed)
Pt c/o medication issue:  1. Name of Medication: Praluent   2. How are you currently taking this medication (dosage and times per day)?   3. Are you having a reaction (difficulty breathing--STAT)?   4. What is your medication issue? Patient would like a call back to discuss any updates on this medication and insurance/payment assistance.

## 2022-01-09 ENCOUNTER — Ambulatory Visit (HOSPITAL_COMMUNITY): Payer: Medicare Other | Admitting: Physician Assistant

## 2022-01-09 ENCOUNTER — Ambulatory Visit (HOSPITAL_COMMUNITY)
Admission: RE | Admit: 2022-01-09 | Discharge: 2022-01-09 | Disposition: A | Discharge status: Home / Self Care | Payer: Medicare Other | Source: Ambulatory Visit | Attending: Orthopedic Surgery | Admitting: Orthopedic Surgery

## 2022-01-09 ENCOUNTER — Encounter (HOSPITAL_COMMUNITY)
Admission: RE | Disposition: A | Discharge status: Home / Self Care | Payer: Self-pay | Source: Ambulatory Visit | Attending: Orthopedic Surgery

## 2022-01-09 ENCOUNTER — Encounter (HOSPITAL_COMMUNITY): Payer: Self-pay | Admitting: Orthopedic Surgery

## 2022-01-09 ENCOUNTER — Ambulatory Visit (HOSPITAL_BASED_OUTPATIENT_CLINIC_OR_DEPARTMENT_OTHER): Payer: Medicare Other | Admitting: Physician Assistant

## 2022-01-09 ENCOUNTER — Ambulatory Visit (HOSPITAL_COMMUNITY): Payer: Medicare Other

## 2022-01-09 ENCOUNTER — Other Ambulatory Visit: Payer: Self-pay

## 2022-01-09 DIAGNOSIS — Z79899 Other long term (current) drug therapy: Secondary | ICD-10-CM | POA: Diagnosis not present

## 2022-01-09 DIAGNOSIS — Z87891 Personal history of nicotine dependence: Secondary | ICD-10-CM

## 2022-01-09 DIAGNOSIS — I2510 Atherosclerotic heart disease of native coronary artery without angina pectoris: Secondary | ICD-10-CM | POA: Diagnosis not present

## 2022-01-09 DIAGNOSIS — K219 Gastro-esophageal reflux disease without esophagitis: Secondary | ICD-10-CM | POA: Insufficient documentation

## 2022-01-09 DIAGNOSIS — Z471 Aftercare following joint replacement surgery: Secondary | ICD-10-CM | POA: Diagnosis not present

## 2022-01-09 DIAGNOSIS — Z01818 Encounter for other preprocedural examination: Secondary | ICD-10-CM

## 2022-01-09 DIAGNOSIS — I110 Hypertensive heart disease with heart failure: Secondary | ICD-10-CM | POA: Insufficient documentation

## 2022-01-09 DIAGNOSIS — Z8711 Personal history of peptic ulcer disease: Secondary | ICD-10-CM | POA: Diagnosis not present

## 2022-01-09 DIAGNOSIS — M24011 Loose body in right shoulder: Secondary | ICD-10-CM | POA: Insufficient documentation

## 2022-01-09 DIAGNOSIS — I252 Old myocardial infarction: Secondary | ICD-10-CM

## 2022-01-09 DIAGNOSIS — Z86718 Personal history of other venous thrombosis and embolism: Secondary | ICD-10-CM | POA: Diagnosis not present

## 2022-01-09 DIAGNOSIS — M19011 Primary osteoarthritis, right shoulder: Secondary | ICD-10-CM | POA: Insufficient documentation

## 2022-01-09 DIAGNOSIS — I509 Heart failure, unspecified: Secondary | ICD-10-CM | POA: Diagnosis not present

## 2022-01-09 DIAGNOSIS — M25711 Osteophyte, right shoulder: Secondary | ICD-10-CM | POA: Diagnosis not present

## 2022-01-09 DIAGNOSIS — Z96611 Presence of right artificial shoulder joint: Secondary | ICD-10-CM | POA: Diagnosis not present

## 2022-01-09 DIAGNOSIS — I4891 Unspecified atrial fibrillation: Secondary | ICD-10-CM | POA: Insufficient documentation

## 2022-01-09 DIAGNOSIS — G8918 Other acute postprocedural pain: Secondary | ICD-10-CM | POA: Diagnosis not present

## 2022-01-09 HISTORY — PX: REVERSE SHOULDER ARTHROPLASTY: SHX5054

## 2022-01-09 SURGERY — ARTHROPLASTY, SHOULDER, TOTAL, REVERSE
Anesthesia: General | Site: Shoulder | Laterality: Right

## 2022-01-09 MED ORDER — CEFAZOLIN SODIUM-DEXTROSE 2-4 GM/100ML-% IV SOLN
2.0000 g | INTRAVENOUS | Status: AC
  Administered 2022-01-09: 2 g via INTRAVENOUS
  Filled 2022-01-09: qty 100

## 2022-01-09 MED ORDER — BUPIVACAINE LIPOSOME 1.3 % IJ SUSP
INTRAMUSCULAR | Status: DC | PRN
  Administered 2022-01-09: 10 mL via PERINEURAL

## 2022-01-09 MED ORDER — VANCOMYCIN HCL 1000 MG IV SOLR
INTRAVENOUS | Status: AC
  Filled 2022-01-09: qty 20

## 2022-01-09 MED ORDER — AMISULPRIDE (ANTIEMETIC) 5 MG/2ML IV SOLN: 10.0000 mg | Freq: Once | INTRAVENOUS | Status: DC | PRN

## 2022-01-09 MED ORDER — ONDANSETRON HCL 4 MG PO TABS: 4.0000 mg | ORAL_TABLET | Freq: Three times a day (TID) | ORAL | 0 refills | Status: DC | PRN

## 2022-01-09 MED ORDER — ONDANSETRON HCL 4 MG/2ML IJ SOLN: 4.0000 mg | Freq: Once | INTRAMUSCULAR | Status: DC | PRN

## 2022-01-09 MED ORDER — FENTANYL CITRATE (PF) 100 MCG/2ML IJ SOLN
INTRAMUSCULAR | Status: DC | PRN
  Administered 2022-01-09 (×2): 50 ug via INTRAVENOUS

## 2022-01-09 MED ORDER — PHENYLEPHRINE HCL (PRESSORS) 10 MG/ML IV SOLN
INTRAVENOUS | Status: DC | PRN
  Administered 2022-01-09 (×2): 80 ug via INTRAVENOUS

## 2022-01-09 MED ORDER — OXYCODONE HCL 5 MG/5ML PO SOLN: 5.0000 mg | Freq: Once | ORAL | Status: AC | PRN

## 2022-01-09 MED ORDER — ONDANSETRON HCL 4 MG/2ML IJ SOLN
INTRAMUSCULAR | Status: DC | PRN
  Administered 2022-01-09: 4 mg via INTRAVENOUS

## 2022-01-09 MED ORDER — PHENYLEPHRINE HCL-NACL 20-0.9 MG/250ML-% IV SOLN
INTRAVENOUS | Status: DC | PRN
  Administered 2022-01-09: 50 ug/min via INTRAVENOUS

## 2022-01-09 MED ORDER — LACTATED RINGERS IV SOLN: INTRAVENOUS | Status: DC

## 2022-01-09 MED ORDER — PHENYLEPHRINE HCL (PRESSORS) 10 MG/ML IV SOLN
INTRAVENOUS | Status: AC
  Filled 2022-01-09: qty 1

## 2022-01-09 MED ORDER — STERILE WATER FOR IRRIGATION IR SOLN
Status: DC | PRN
  Administered 2022-01-09: 2000 mL

## 2022-01-09 MED ORDER — LIDOCAINE HCL (CARDIAC) PF 100 MG/5ML IV SOSY
PREFILLED_SYRINGE | INTRAVENOUS | Status: DC | PRN
  Administered 2022-01-09: 100 mg via INTRAVENOUS

## 2022-01-09 MED ORDER — DEXMEDETOMIDINE HCL IN NACL 80 MCG/20ML IV SOLN
INTRAVENOUS | Status: DC | PRN
  Administered 2022-01-09: 8 ug via BUCCAL

## 2022-01-09 MED ORDER — FENTANYL CITRATE PF 50 MCG/ML IJ SOSY: 25.0000 ug | PREFILLED_SYRINGE | INTRAMUSCULAR | Status: DC | PRN

## 2022-01-09 MED ORDER — TRANEXAMIC ACID-NACL 1000-0.7 MG/100ML-% IV SOLN
1000.0000 mg | INTRAVENOUS | Status: AC
  Administered 2022-01-09: 1000 mg via INTRAVENOUS
  Filled 2022-01-09: qty 100

## 2022-01-09 MED ORDER — VANCOMYCIN HCL 1000 MG IV SOLR
INTRAVENOUS | Status: DC | PRN
  Administered 2022-01-09: 1000 mg

## 2022-01-09 MED ORDER — BUPIVACAINE HCL (PF) 0.5 % IJ SOLN
INTRAMUSCULAR | Status: DC | PRN
  Administered 2022-01-09: 15 mL via PERINEURAL

## 2022-01-09 MED ORDER — MIDAZOLAM HCL 2 MG/2ML IJ SOLN
INTRAMUSCULAR | Status: AC
  Filled 2022-01-09: qty 2

## 2022-01-09 MED ORDER — OXYCODONE HCL 5 MG PO TABS: 5.0000 mg | ORAL_TABLET | Freq: Four times a day (QID) | ORAL | 0 refills | Status: DC | PRN

## 2022-01-09 MED ORDER — FENTANYL CITRATE PF 50 MCG/ML IJ SOSY
50.0000 ug | PREFILLED_SYRINGE | INTRAMUSCULAR | Status: DC
  Filled 2022-01-09: qty 2

## 2022-01-09 MED ORDER — ROCURONIUM BROMIDE 100 MG/10ML IV SOLN
INTRAVENOUS | Status: DC | PRN
  Administered 2022-01-09: 70 mg via INTRAVENOUS

## 2022-01-09 MED ORDER — FENTANYL CITRATE (PF) 100 MCG/2ML IJ SOLN
INTRAMUSCULAR | Status: AC
  Filled 2022-01-09: qty 2

## 2022-01-09 MED ORDER — DEXAMETHASONE SODIUM PHOSPHATE 10 MG/ML IJ SOLN
INTRAMUSCULAR | Status: DC | PRN
  Administered 2022-01-09: 10 mg via INTRAVENOUS

## 2022-01-09 MED ORDER — PROPOFOL 10 MG/ML IV BOLUS
INTRAVENOUS | Status: AC
  Filled 2022-01-09: qty 20

## 2022-01-09 MED ORDER — ACETAMINOPHEN 500 MG PO TABS
1000.0000 mg | ORAL_TABLET | Freq: Once | ORAL | Status: AC
  Administered 2022-01-09: 1000 mg via ORAL
  Filled 2022-01-09: qty 2

## 2022-01-09 MED ORDER — OXYCODONE HCL 5 MG PO TABS
5.0000 mg | ORAL_TABLET | Freq: Once | ORAL | Status: AC | PRN
  Administered 2022-01-09: 5 mg via ORAL

## 2022-01-09 MED ORDER — OXYCODONE HCL 5 MG PO TABS
ORAL_TABLET | ORAL | Status: AC
  Filled 2022-01-09: qty 1

## 2022-01-09 MED ORDER — MIDAZOLAM HCL 5 MG/5ML IJ SOLN
INTRAMUSCULAR | Status: DC | PRN
  Administered 2022-01-09 (×2): 1 mg via INTRAVENOUS

## 2022-01-09 MED ORDER — 0.9 % SODIUM CHLORIDE (POUR BTL) OPTIME
TOPICAL | Status: DC | PRN
  Administered 2022-01-09: 2000 mL

## 2022-01-09 MED ORDER — SUGAMMADEX SODIUM 500 MG/5ML IV SOLN
INTRAVENOUS | Status: DC | PRN
  Administered 2022-01-09: 200 mg via INTRAVENOUS

## 2022-01-09 MED ORDER — ORAL CARE MOUTH RINSE: 15.0000 mL | Freq: Once | OROMUCOSAL | Status: AC

## 2022-01-09 MED ORDER — PROPOFOL 10 MG/ML IV BOLUS
INTRAVENOUS | Status: DC | PRN
  Administered 2022-01-09: 120 mg via INTRAVENOUS

## 2022-01-09 MED ORDER — CHLORHEXIDINE GLUCONATE 0.12 % MT SOLN
15.0000 mL | Freq: Once | OROMUCOSAL | Status: AC
  Administered 2022-01-09: 15 mL via OROMUCOSAL

## 2022-01-09 SURGICAL SUPPLY — 72 items
BAG COUNTER SPONGE SURGICOUNT (BAG) IMPLANT
BAG ZIPLOCK 12X15 (MISCELLANEOUS) ×1 IMPLANT
BEARING HUMERAL +3 40D (Joint) IMPLANT
BIT DRILL 2.7 W/STOP DISP (BIT) IMPLANT
BIT DRILL TWIST 2.7 (BIT) IMPLANT
BLADE SAG 18X100X1.27 (BLADE) ×1 IMPLANT
CEMENT BONE DEPUY (Cement) IMPLANT
COMP REV AUG LG W/TAPER/GLENOI (Joint) ×1 IMPLANT
COMPONENT RV AUG LG W/TAPR/GLN (Joint) IMPLANT
COVER BACK TABLE 60X90IN (DRAPES) ×1 IMPLANT
COVER SURGICAL LIGHT HANDLE (MISCELLANEOUS) ×1 IMPLANT
DRAPE ORTHO SPLIT 77X108 STRL (DRAPES) ×2
DRAPE SHEET LG 3/4 BI-LAMINATE (DRAPES) ×1 IMPLANT
DRAPE SURG 17X11 SM STRL (DRAPES) ×1 IMPLANT
DRAPE SURG ORHT 6 SPLT 77X108 (DRAPES) ×2 IMPLANT
DRAPE TOP 10253 STERILE (DRAPES) ×1 IMPLANT
DRAPE U-SHAPE 47X51 STRL (DRAPES) ×1 IMPLANT
DRSG AQUACEL AG ADV 3.5X 6 (GAUZE/BANDAGES/DRESSINGS) IMPLANT
DRSG AQUACEL AG ADV 3.5X10 (GAUZE/BANDAGES/DRESSINGS) ×1 IMPLANT
DURAPREP 26ML APPLICATOR (WOUND CARE) IMPLANT
ELECT REM PT RETURN 15FT ADLT (MISCELLANEOUS) ×1 IMPLANT
FACESHIELD WRAPAROUND (MASK) ×1 IMPLANT
FACESHIELD WRAPAROUND OR TEAM (MASK) ×1 IMPLANT
GLENOSPHERE VERSADIAL 40 +3 (Joint) IMPLANT
GLOVE BIO SURGEON STRL SZ7.5 (GLOVE) ×4 IMPLANT
GLOVE BIOGEL PI IND STRL 8 (GLOVE) ×2 IMPLANT
GOWN STRL REUS W/ TWL XL LVL3 (GOWN DISPOSABLE) ×2 IMPLANT
GOWN STRL REUS W/TWL XL LVL3 (GOWN DISPOSABLE) ×2
GUIDE MODEL REV SHLD RT (ORTHOPEDIC DISPOSABLE SUPPLIES) IMPLANT
KIT BASIN OR (CUSTOM PROCEDURE TRAY) ×1 IMPLANT
KIT TURNOVER KIT A (KITS) IMPLANT
MANIFOLD NEPTUNE II (INSTRUMENTS) ×1 IMPLANT
NDL TAPERED W/ NITINOL LOOP (MISCELLANEOUS) IMPLANT
NEEDLE TAPERED W/ NITINOL LOOP (MISCELLANEOUS) IMPLANT
NS IRRIG 1000ML POUR BTL (IV SOLUTION) ×1 IMPLANT
PACK SHOULDER (CUSTOM PROCEDURE TRAY) ×1 IMPLANT
PIN STEINMANN THREADED TIP (PIN) IMPLANT
PIN THREADED REVERSE (PIN) IMPLANT
PROTECTOR NERVE ULNAR (MISCELLANEOUS) ×1 IMPLANT
REAMER GUIDE BUSHING SURG DISP (MISCELLANEOUS) IMPLANT
REAMER GUIDE MINI 30 ZI (ORTHOPEDIC DISPOSABLE SUPPLIES) IMPLANT
REAMER GUIDE W/SCREW AUG (MISCELLANEOUS) IMPLANT
RESTRAINT HEAD UNIVERSAL NS (MISCELLANEOUS) IMPLANT
SCREW BONE LOCKING 4.75X30X3.5 (Screw) IMPLANT
SCREW BONE STRL 6.5MMX25MM (Screw) IMPLANT
SCREW LOCKING 4.75MMX15MM (Screw) IMPLANT
SCREW LOCKING NS 4.75MMX20MM (Screw) IMPLANT
SLING ARM FOAM STRAP MED (SOFTGOODS) IMPLANT
SLING ARM IMMOBILIZER LRG (SOFTGOODS) IMPLANT
SLING ARM IMMOBILIZER XL (CAST SUPPLIES) IMPLANT
SMARTMIX MINI TOWER (MISCELLANEOUS)
SPONGE T-LAP 4X18 ~~LOC~~+RFID (SPONGE) IMPLANT
STEM HUMERAL STRL 12MMX83MM (Stem) IMPLANT
STRIP CLOSURE SKIN 1/2X4 (GAUZE/BANDAGES/DRESSINGS) ×1 IMPLANT
SUCTION FRAZIER HANDLE 10FR (MISCELLANEOUS) ×1
SUCTION TUBE FRAZIER 10FR DISP (MISCELLANEOUS) ×1 IMPLANT
SUT BROADBAND TAPE 2PK 1.5 (SUTURE) IMPLANT
SUT FIBERWIRE #2 38 T-5 BLUE (SUTURE)
SUT MAXBRAID #2 CVD NDL (SUTURE) IMPLANT
SUT MON AB 3-0 SH 27 (SUTURE) ×1
SUT MON AB 3-0 SH27 (SUTURE) ×1 IMPLANT
SUT VIC AB 0 CT1 36 (SUTURE) ×1 IMPLANT
SUT VIC AB 1 CT1 36 (SUTURE) ×1 IMPLANT
SUT VIC AB 2-0 CT1 27 (SUTURE) ×1
SUT VIC AB 2-0 CT1 TAPERPNT 27 (SUTURE) ×1 IMPLANT
SUTURE FIBERWR #2 38 T-5 BLUE (SUTURE) IMPLANT
SUTURE TAPE 1.3 40 TPR END (SUTURE) ×2 IMPLANT
SUTURETAPE 1.3 40 TPR END (SUTURE) ×2
TOWEL OR 17X26 10 PK STRL BLUE (TOWEL DISPOSABLE) ×1 IMPLANT
TOWER SMARTMIX MINI (MISCELLANEOUS) IMPLANT
TRAY HUM MINI SHOULDER +3 40 (Joint) IMPLANT
TUBE SUCTION HIGH CAP CLEAR NV (SUCTIONS) ×1 IMPLANT

## 2022-01-09 NOTE — Anesthesia Procedure Notes (Signed)
Procedure Name: Intubation Date/Time: 01/09/2022 9:16 AM  Performed by: Jonna Munro, CRNAPre-anesthesia Checklist: Patient identified, Emergency Drugs available, Suction available, Patient being monitored and Timeout performed Patient Re-evaluated:Patient Re-evaluated prior to induction Oxygen Delivery Method: Circle system utilized Preoxygenation: Pre-oxygenation with 100% oxygen Induction Type: IV induction Ventilation: Oral airway inserted - appropriate to patient size Laryngoscope Size: Mac and 3 Grade View: Grade I Tube type: Oral Tube size: 7.5 mm Number of attempts: 1 Airway Equipment and Method: Stylet Placement Confirmation: ETT inserted through vocal cords under direct vision, positive ETCO2, CO2 detector and breath sounds checked- equal and bilateral Secured at: 23 cm Tube secured with: Tape Dental Injury: Teeth and Oropharynx as per pre-operative assessment

## 2022-01-09 NOTE — Anesthesia Procedure Notes (Signed)
Anesthesia Regional Block: Interscalene brachial plexus block   Pre-Anesthetic Checklist: , timeout performed,  Correct Patient, Correct Site, Correct Laterality,  Correct Procedure, Correct Position, site marked,  Risks and benefits discussed,  Surgical consent,  Pre-op evaluation,  At surgeon's request and post-op pain management  Laterality: Right  Prep: chloraprep       Needles:  Injection technique: Single-shot  Needle Type: Echogenic Stimulator Needle     Needle Length: 10cm  Needle Gauge: 20     Additional Needles:   Procedures:,,,, ultrasound used (permanent image in chart),,    Narrative:  Start time: 01/09/2022 8:30 AM End time: 01/09/2022 8:34 AM Injection made incrementally with aspirations every 5 mL.  Performed by: Personally  Anesthesiologist: Lidia Collum, MD  Additional Notes: Standard monitors applied. Skin prepped. Good needle visualization with ultrasound. Injection made in 5cc increments with no resistance to injection. Patient tolerated the procedure well.

## 2022-01-09 NOTE — H&P (Signed)
ORTHOPAEDIC H&P  REQUESTING PHYSICIAN: Nicholes Stairs, MD  PCP:  Vivi Barrack, MD  Chief Complaint: Right shoulder end-stage osteoarthritis  HPI: Jonathan Holden is a 76 y.o. male who complains of right shoulder pain and stiffness.  Here today for reverse arthroplasty.  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   Pseudophakia of left eye 05/22/2013   Pseudophakia of right eye 06/05/2013   S/P coronary angioplasty    Skin cancer    Hands, arms, nose   Streptococcal bacteremia    Past Surgical History:  Procedure Laterality Date   CARDIOVASCULAR STRESS TEST  08/22/2008   CATARACT EXTRACTION W/ INTRAOCULAR LENS IMPLANT Bilateral    CORONARY ANGIOPLASTY  1992   ERCP W/ SPHICTEROTOMY  12/15/2002   LAPAROSCOPIC CHOLECYSTECTOMY  12/16/2002   LUMBAR LAMINECTOMY     L5   RIGHT SHOULDER SURGERY      SKIN CANCER EXCISION     TEE WITHOUT CARDIOVERSION N/A 02/28/2016   Procedure: TRANSESOPHAGEAL ECHOCARDIOGRAM (TEE);  Surgeon: Fay Records, MD;  Location: [facility];  Service: Cardiovascular;  Laterality: N/A;   TOTAL HIP ARTHROPLASTY Left 07/23/2021   Procedure: TOTAL HIP ARTHROPLASTY ANTERIOR APPROACH;  Surgeon: Gaynelle Arabian, MD;  Location[facility];  Service: Orthopedics;  Laterality: Left;   Social History   Socioeconomic History   Marital status: Divorced    Spouse name: Not on file   Number of children: Not on file   Years of education:  Not on file   Highest education level: Not on file  Occupational History   Occupation: sales  Tobacco Use   Smoking status: Former    Packs/day: 2.00    Years: 20.00    Total pack years: 40.00    Types: Cigarettes    Quit date: 03/27/1990    Years since quitting: 31.8   Smokeless tobacco: Never  Vaping Use   Vaping Use: Never used  Substance and Sexual Activity   Alcohol use: Yes    Comment: Social   Drug use: No   Sexual activity: Not Currently  Other Topics Concern   Not on file  Social History Narrative   Not on file   Social Determinants of Health   Financial Resource Strain: Low Risk  (10/09/2021)   Overall Financial Resource Strain (CARDIA)    Difficulty of Paying Living Expenses: Not hard at all  Food Insecurity: No Food Insecurity (10/09/2021)   Hunger Vital Sign    Worried About Running Out of Food in the Last Year: Never true    [place] in the Last Year: Never true  Transportation Needs: No Transportation Needs (10/09/2021)   PRAPARE - Hydrologist (Medical): No    Lack of Transportation (Non-Medical): No  Physical Activity: Insufficiently Active (10/09/2021)   Exercise Vital Sign    Days of Exercise per Week: 2 days  Minutes of Exercise per Session: 40 min  Stress: No Stress Concern Present (10/09/2021)   [place]    Feeling of Stress : Not at all  Social Connections: Socially Isolated (10/09/2021)   Social Connection and Isolation Panel [NHANES]    Frequency of Communication with Friends and Family: More than three times a week    Frequency of Social Gatherings with Friends and Family: More than three times a week    Attends Religious Services: Never    Marine scientist or Organizations: No    Attends Music therapist: Never    Marital Status: Divorced   Family History  Problem Relation Age of Onset   Other Mother        AGE 36  HEALTHY   Heart attack Father 7       2 MI   Heart disease Maternal Grandfather    Heart disease Paternal Grandfather    Emphysema Other    Other Brother    Allergies  Allergen Reactions   Eliquis [Apixaban] Other (See Comments)    Recurrent GI bleeding occurred from diverticulosis in the setting of anticoagulation therapy for atrial fibrillation.   Food Other (See Comments)    Pt is allergic to melons caused scratchy throat    Glutamic Acid Other (See Comments)    Pt is allergic to melons caused scratchy throat    Grass Pollen(K-O-R-T-Swt Vern)     Sneezing    Ciprofloxacin Hives   Pollen Extract Other (See Comments)    Sneezing    Prior to Admission medications   Medication Sig Start Date End Date Taking? Authorizing Provider  amiodarone (PACERONE) 200 MG tablet TAKE 1 TABLET (200 MG TOTAL) BY MOUTH DAILY. \ 04/23/21  Yes Belva Crome, MD  Carboxymethylcellul-Glycerin (LUBRICATING EYE DROPS OP) Place 1 drop into both eyes daily.   Yes [provider]  clobetasol ointment (TEMOVATE) 0.05 % APPLY TO AFFECTED AREA TWICE A DAY Patient taking differently: Apply 1 Application topically daily as needed (eczema). 10/24/21  Yes Vivi Barrack, MD  ketoconazole (NIZORAL) 2 % shampoo APPLY 1 3 TIMES A WEEK TO SCALP. LET SIT 10 MIN THEN RINSE OUT. Strength: 2 % 02/06/21  Yes Vivi Barrack, MD  lisinopril (ZESTRIL) 10 MG tablet TAKE 1 TABLET BY MOUTH DAILY. PLEASE KEEP UPCOMING APPOINTMENT WITH DR. Tamala Julian IN APRIL 2023 04/23/21  Yes Belva Crome, MD  metoprolol succinate (TOPROL-XL) 25 MG 24 hr tablet TAKE 1 TABLET (25 MG TOTAL) BY MOUTH DAILY. PLEASE KEEP UPCOMING APPT IN APRIL 04/23/21  Yes Belva Crome, MD  PRALUENT 150 MG/ML SOAJ INJECT 1 PEN INTO THE SKIN EVERY 14 (FOURTEEN) DAYS. 04/07/21  Yes Belva Crome, MD  triamcinolone cream (KENALOG) 0.1 % Apply 1 Application topically daily as needed (dry skin). 12/27/20  Yes [provider]  valACYclovir (VALTREX) 1000 MG  tablet TAKE 2 TABLETS BY MOUTH AT THE ONSET OF A FLARE THEN TAKE 1 TWICE DAILY AS NEEDED FOR FEVER BLISTERS 12/26/21  Yes Vivi Barrack, MD  cephALEXin (KEFLEX) 500 MG capsule Take 1 capsule (500 mg total) by mouth 2 (two) times daily. Take for 7 days Patient not taking: Reported on 12/23/2021 12/22/21   Tawnya Crook, MD  methocarbamol (ROBAXIN) 500 MG tablet Take 1 tablet (500 mg total) by mouth every 6 (six) hours as needed for muscle spasms. 07/24/21   Edmisten, Ok Anis, PA  traMADol (ULTRAM) 50  MG tablet Take 1-2 tablets (50-100 mg total) by mouth every 6 (six) hours as needed for moderate pain. 07/24/21   Edmisten, Kristie L, PA   No results found.  Positive ROS: All other systems have been reviewed and were otherwise negative with the exception of those mentioned in the HPI and as above.  Physical Exam: General: Alert, no acute distress Cardiovascular: No pedal edema Respiratory: No cyanosis, no use of accessory musculature GI: No organomegaly, abdomen is soft and non-tender Skin: No lesions in the area of chief complaint Neurologic: Sensation intact distally Psychiatric: Patient is competent for consent with normal mood and affect Lymphatic: No axillary or cervical lymphadenopathy  MUSCULOSKELETAL: Right upper extremity warm and well-perfused with no open wounds or lesions.  Neurovascular intact.  Assessment: Right shoulder end-stage osteoarthritis  Plan: Plan to proceed today with reverse arthroplasty right shoulder for definitive treatment.  We again discussed the risk of bleeding, infection, damage to surrounding nerves and vessels dislocation, fracture, stiffness, persistent pain as well as the risk of anesthesia.  He has provided informed consent.  We will tentatively plan for discharge home postop from PACU.    Nicholes Stairs, MD Cell [phone]    01/09/2022 8:44 AM

## 2022-01-09 NOTE — Brief Op Note (Signed)
01/09/2022  11:01 AM  PATIENT:  [name]  76 y.o. male  PRE-OPERATIVE DIAGNOSIS:  Right shoulder osteoarthritis  POST-OPERATIVE DIAGNOSIS:  Right shoulder osteoarthritis  PROCEDURE:  Procedure(s) with comments: REVERSE SHOULDER ARTHROPLASTY (Right) - 150  SURGEON:  Surgeon(s) and Role:    * Stann Mainland, Elly Modena, MD - Primary  PHYSICIAN ASSISTANT: Jonelle Sidle   ANESTHESIA:   regional and general  EBL:  100 mL   BLOOD ADMINISTERED:none  DRAINS: none   LOCAL MEDICATIONS USED:  NONE  SPECIMEN:  No Specimen  DISPOSITION OF SPECIMEN:  N/A  COUNTS:  YES  TOURNIQUET:  * No tourniquets in log *  DICTATION: .Note written in EPIC  PLAN OF CARE: Discharge to home after PACU  PATIENT DISPOSITION:  PACU - hemodynamically stable.   Delay start of Pharmacological VTE agent (>24hrs) due to surgical blood loss or risk of bleeding: not applicable

## 2022-01-09 NOTE — Anesthesia Preprocedure Evaluation (Signed)
Anesthesia Evaluation  Patient identified by MRN, date of birth, ID band Patient awake    Reviewed: Allergy & Precautions, NPO status , Patient's Chart, lab work & pertinent test results  History of Anesthesia Complications Negative for: history of anesthetic complications  Airway Mallampati: II  TM Distance: >3 FB Neck ROM: Full    Dental  (+) Dental Advisory Given   Pulmonary neg pulmonary ROS, former smoker   Pulmonary exam normal        Cardiovascular hypertension, Pt. on home beta blockers and Pt. on medications + CAD, + Past MI (1992), +CHF and + DVT  Normal cardiovascular exam+ dysrhythmias Atrial Fibrillation + Valvular Problems/Murmurs (h/o endocarditis)   Echo 2018: EF 55%, mild LVH, g1dd, trivial AR   Neuro/Psych negative neurological ROS     GI/Hepatic Neg liver ROS, PUD,GERD  ,,  Endo/Other  negative endocrine ROS    Renal/GU negative Renal ROS  negative genitourinary   Musculoskeletal negative musculoskeletal ROS (+)    Abdominal   Peds  Hematology negative hematology ROS (+)   Anesthesia Other Findings   Reproductive/Obstetrics                              Anesthesia Physical Anesthesia Plan  ASA: 3  Anesthesia Plan: General   Post-op Pain Management: Regional block* and Tylenol PO (pre-op)*   Induction: Intravenous  PONV Risk Score and Plan: 2 and Ondansetron, Dexamethasone, Treatment may vary due to age or medical condition and Midazolam  Airway Management Planned: Oral ETT  Additional Equipment: None  Intra-op Plan:   Post-operative Plan: Extubation in OR  Informed Consent: I have reviewed the patients History and Physical, chart, labs and discussed the procedure including the risks, benefits and alternatives for the proposed anesthesia with the patient or authorized representative who has indicated his/her understanding and acceptance.     Dental  advisory given  Plan Discussed with:   Anesthesia Plan Comments:          Anesthesia Quick Evaluation

## 2022-01-09 NOTE — Anesthesia Postprocedure Evaluation (Signed)
Anesthesia Post Note  Patient: Jonathan Holden  Procedure(s) Performed: REVERSE SHOULDER ARTHROPLASTY (Right: Shoulder)     Patient location during evaluation: PACU Anesthesia Type: General Level of consciousness: awake and alert Pain management: pain level controlled Vital Signs Assessment: post-procedure vital signs reviewed and stable Respiratory status: spontaneous breathing, nonlabored ventilation and respiratory function stable Cardiovascular status: blood pressure returned to baseline and stable Postop Assessment: no apparent nausea or vomiting Anesthetic complications: no   No notable events documented.  Last Vitals:  Vitals:   01/09/22 1230 01/09/22 1300  BP: 115/70 116/79  Pulse: 64 64  Resp:    Temp:    SpO2: 95% 98%    Last Pain:  Vitals:   01/09/22 1300  TempSrc:   PainSc: 1                  Lidia Collum

## 2022-01-09 NOTE — Discharge Instructions (Signed)
Orthopedic surgery discharge instructions:  -Maintain postoperative bandage until follow-up appointment.  This is waterproof, and you may begin showering on postoperative day #3.  Do not submerge underwater.  Maintain that bandage until your follow-up appointment in 2 weeks.  -No lifting over 2 pounds with operateive arm.  You may use the arm immediately for activities of daily living such as bathing, washing your face and brushing your teeth, eating, and getting dressed.  Otherwise maintain your sling when you are out of the house and sleeping.  -Apply ice liberally to the shoulder throughout the day.  For mild to moderate pain use Tylenol and Advil as needed around-the-clock.  For breakthrough pain use oxycodone as necessary.  -You will return to see Dr. Maxim Bedel in the office in 2 weeks for routine postoperative check with x-rays.  

## 2022-01-09 NOTE — Op Note (Signed)
01/09/2022  1:38 PM  PATIENT:  Jonathan Holden    PRE-OPERATIVE DIAGNOSIS:  Right shoulder osteoarthritis  POST-OPERATIVE DIAGNOSIS:  Same  PROCEDURE: 1.  Right REVERSE SHOULDER ARTHROPLASTY 2.  Right shoulder intraoperative CT guidance with 3T printed guides for the glenoid.  SURGEON:  Nicholes Stairs, MD  ASSISTANT: Jonelle Sidle, PA-C  Assistant attestation:  The [facility] present for the entire procedure.  ANESTHESIA:   General plus interscalene  ESTIMATED BLOOD LOSS: 100 cc  PREOPERATIVE INDICATIONS:  Jonathan Holden is a  76 y.o. male with a diagnosis of Right shoulder osteoarthritis who failed conservative measures and elected for surgical management.    The risks benefits and alternatives were discussed with the patient preoperatively including but not limited to the risks of infection, bleeding, nerve injury, cardiopulmonary complications, the need for revision surgery, dislocation, brachial plexus palsy, incomplete relief of pain, among others, and the patient was willing to proceed.  OPERATIVE IMPLANTS:  Biomet reverse comprehensive system with size 12 mini stem press-fit A large augment oriented at the 10 o'clock position on the glenoid baseplate with a 25 mm central compression screw and 4 peripheral locking screws. 40 mm +3 glenosphere +3 medialized humeral tray with a +3 polyethylene liner   OPERATIVE FINDINGS:  Has osteoarthritis of the glenohumeral joint with significant central/medialization as well as retroversion consistent with a B3 glenoid.  He had large marginal osteophytes of the humeral head as well as glenoid.  He had intact rotator cuff muscles x 4.  Long head of biceps was likewise intact but significantly flattened.  There was also thinning/attenuation noted of the supraspinatus.  Multiple loose bodies encountered as well.  OPERATIVE PROCEDURE: The patient was brought to the operating room and placed in the supine position. General anesthesia  was administered. IV antibiotics were given. A Foley was not placed. Time out was performed. The upper extremity was prepped and draped in usual sterile fashion. The patient was in a beachchair position. Deltopectoral approach was carried out. The biceps was tenodesed to the pectoralis tendon with #2 Fiberwire. The subscapularis was released off of the bone.   I then performed circumferential releases of the humerus, and then dislocated the head, and then reamed with the reamer to the above named size.  I then applied the jig, and cut the humeral head in 30 of retroversion, and then turned my attention to the glenoid.  We then remove the marginal osteophytes with osteotome and rondure.  Deep retractors were placed, and I resected the labrum, and then placed a guidepin into the center position on the glenoid.  At this point we utilized preoperative CT with patient-specific printed guides and intraoperative guidance to localize the center position for this significantly deformed B3 glenoid.  We placed the central pin, with slight inferior inclination.  We then performed our step reamer followed by the reamer for the large augment which was oriented at the 10 o'clock position as our patient-specific guide indicated.  I then reamed over the guidepin, and this created a small metaphyseal cancellus blush inferiorly, removing just the cartilage to the subchondral bone superiorly. The base plate was selected and impacted place, and then I secured it centrally with a nonlocking screw, and I had excellent purchase both inferiorly and superiorly. I placed a short locking screws on anterior and posterior aspects.  I then turned my attention to the glenosphere, and impacted this into place, placing slight inferior offset (set on a).   The glenoid sphere was completely  seated, and had engagement of the [facility] taper. I then turned my attention back to the humerus.  I sequentially broached, and then trialed, and was  found to restore soft tissue tension, and it had 2 finger tightness. Therefore the above named components were selected. The shoulder felt stable throughout functional motion.  Before I placed the real prosthesis I had also placed a total of 3 #2 FiberWire through drill holes in the humerus for later subscapularis repair.  I then impacted the real prosthesis into place, as well as the real humeral tray, and reduced the shoulder. The shoulder had excellent motion, and was stable, and I irrigated the wounds copiously.    I then used these to repair the subscapularis. This came down to bone.  I then irrigated the shoulder copiously once more, repaired the deltopectoral interval with #2 FiberWire followed by subcutaneous Vicryl, then monocryl for the skin,  with Steri-Strips and sterile gauze for the skin. The patient was awakened and returned back in stable and satisfactory condition. There no complications and they tolerated the procedure well.  All counts were correct x2.  Disposition:  Jonathan Holden will be able to use his right upper extremity immediately for activities of daily living.  Otherwise will be in his sling when sleeping and when out of the house.  Will see him back in the office in 2 weeks for routine follow-up as well as AP and scapular Y x-rays.  He will discharge home today from PACU.

## 2022-01-09 NOTE — Care Plan (Signed)
Ortho Bundle Case Management Note  Patient Details  Name: Jonathan Holden MRN: [id number] Date of Birth: May 03, 1945                  R Rev TSA on 01/09/22.  DCP: Home with brother and friend.  DME: Sling and ice machine provided by hospital.  PT: HEP    Additional Comments: Please contact me with any questions of if this plan should need to change.   Dario Ave, Case Manager  [facility]  [phone] 01/09/2022, 10:19 AM

## 2022-01-09 NOTE — Transfer of Care (Signed)
Immediate Anesthesia Transfer of Care Note  Patient: Jonathan Holden  Procedure(s) Performed: REVERSE SHOULDER ARTHROPLASTY (Right: Shoulder)  Patient Location: PACU  Anesthesia Type:General  Level of Consciousness: awake, alert , oriented, and patient cooperative  Airway & Oxygen Therapy: Patient Spontanous Breathing and Patient connected to nasal cannula oxygen  Post-op Assessment: Report given to RN and Post -op Vital signs reviewed and stable  Post vital signs: Reviewed and stable  Last Vitals:  Vitals Value Taken Time  BP 121/73   Temp    Pulse 74 01/09/22 1136  Resp    SpO2 98 % 01/09/22 1136  Vitals shown include unvalidated device data.  Last Pain:  Vitals:   01/09/22 0644  TempSrc: Oral  PainSc: 0-No pain         Complications: No notable events documented.

## 2022-01-14 ENCOUNTER — Encounter (HOSPITAL_COMMUNITY): Payer: Self-pay | Admitting: Orthopedic Surgery

## 2022-01-21 ENCOUNTER — Telehealth: Payer: Self-pay | Admitting: Pharmacist

## 2022-01-21 NOTE — Telephone Encounter (Signed)
Prior auth denied. Appeal submitted via cover my meds.

## 2022-01-21 NOTE — Telephone Encounter (Signed)
Prior auth for [employer] submitted, Key: [id number] . Pt's formulary changed to [place] for this year but pt wishes to remain on Praluent if possible. He was in the clinical trial for Praluent and has been on therapy since at least 2015. Med has been working well and he doesn't want to change to [place] just because his insurance changed its formulary. Advised pt that prior auth would likely be denied, he would like it to be appealed if so.

## 2022-01-22 DIAGNOSIS — Z4789 Encounter for other orthopedic aftercare: Secondary | ICD-10-CM | POA: Diagnosis not present

## 2022-01-22 NOTE — Telephone Encounter (Signed)
Appeal denied as well. Insurance will only cover Repatha, Praluent is non formulary.  Left message for pt. Will need to change him to Repatha if he is willing.

## 2022-01-23 MED ORDER — REPATHA SURECLICK 140 MG/ML ~~LOC~~ SOAJ: 1.0000 | SUBCUTANEOUS | 3 refills | Status: DC

## 2022-01-23 NOTE — Telephone Encounter (Signed)
Called pt to advise him of Dr Thompson Caul recommendation. He is agreeable to start [place]. Still has Praluent left at home for the next month or two and is aware to change to Repatha after. PA for Repatha submitted and approved through 01/19/23. Rx sent to pharmacy.

## 2022-02-03 ENCOUNTER — Encounter (HOSPITAL_COMMUNITY): Payer: Self-pay | Admitting: Gastroenterology

## 2022-02-03 NOTE — Progress Notes (Signed)
Attempted to obtain medical history via telephone, unable to reach at this time. HIPAA compliant voicemail message left requesting return call to pre surgical testing department.  

## 2022-02-05 NOTE — Progress Notes (Signed)
Cardiology Office Note:    Date:  02/06/2022   ID:  [name], DOB 07/17/1945, MRN [id number]  PCP:  Vivi Barrack, MD   [place] [facility] Cardiologist:  Daneen Schick, now [place]   Electrophysiologist:  Will Meredith Leeds, MD {    Referring MD: Vivi Barrack, MD   Chief Complaint  Patient presents with   deep vein thrombosis    History of Present Illness:   Jan. 19, 2024    Jonathan Holden is a 77 y.o. male with a hx of Afib, CAD , HTN, HLD He presented with leg edema in Dec.  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 Scot Dock who thought the clots were old and recommended compression hose.    The patient has a history of significant GI bleed from diverticulosis while being on anticoagulation for his atrial fibrillation.  Dr. Scot Dock spoke to him briefly  He may need an IVC filter .   His last episode of Afib was 5 years ago   Has not taken his meds today  - BP is a bit higher today  On Amio for his PAF Will get PFTs and TSH today  Encouraged him to go see his eye doctor yearly   Had an episode of lightheadedness EMS was called.  Was thought to have Afib. Received something IV ( ? Diltiazem )  Resolved   We discussed possible Watchman  CHADS2VASC is  24  ( age 16, CAD,  HTN )  I would like to have him see one of our electrophysiologist to discuss watchman.  In addition, I would like to potentially lower his dose of amiodarone.  I would appreciate the electrophysiology opinion regarding that as well.     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   Pseudophakia of left eye 05/22/2013   Pseudophakia of right eye 06/05/2013   S/P coronary angioplasty    Skin cancer    Hands, arms, nose   Streptococcal bacteremia     Past Surgical History:  Procedure Laterality Date   CARDIOVASCULAR STRESS TEST  08/22/2008   CATARACT EXTRACTION W/ INTRAOCULAR LENS IMPLANT Bilateral    CORONARY ANGIOPLASTY  1992   ERCP W/ SPHICTEROTOMY  12/15/2002   LAPAROSCOPIC CHOLECYSTECTOMY  12/16/2002   LUMBAR LAMINECTOMY     L5   REVERSE SHOULDER ARTHROPLASTY Right 01/09/2022   Procedure: REVERSE SHOULDER ARTHROPLASTY;  Surgeon: Nicholes Stairs, MD;  Location: [facility];  Service: Orthopedics;  Laterality: Right;  150   RIGHT SHOULDER SURGERY      SKIN CANCER EXCISION     TEE WITHOUT CARDIOVERSION N/A 02/28/2016   Procedure: TRANSESOPHAGEAL ECHOCARDIOGRAM (TEE);  Surgeon: Fay Records, MD;  Location: [place];  Service: Cardiovascular;  Laterality: N/A;   TOTAL HIP ARTHROPLASTY Left 07/23/2021   Procedure: TOTAL HIP ARTHROPLASTY ANTERIOR APPROACH;  Surgeon: Gaynelle Arabian, MD;  Location: [facility];  Service: Orthopedics;  Laterality: Left;    Current Medications: Current Meds  Medication Sig   Alirocumab (PRALUENT) 150 MG/ML SOAJ Inject 150 mg into the skin every 14 (fourteen) days.   amiodarone (PACERONE) 200 MG tablet TAKE 1 TABLET (200 MG TOTAL) BY MOUTH DAILY. \   Carboxymethylcellul-Glycerin (LUBRICATING EYE DROPS OP) Place 1 drop into both eyes daily as needed (dry eyes).   cetirizine (ZYRTEC) 10 MG tablet Take 10 mg by mouth daily as needed for allergies.   clobetasol ointment (TEMOVATE) 0.05 % APPLY TO AFFECTED AREA TWICE A DAY   ketoconazole (NIZORAL) 2 % shampoo APPLY 1 3 TIMES A WEEK TO SCALP. LET SIT 10 MIN THEN RINSE OUT. Strength: 2 %   lisinopril (ZESTRIL) 10 MG tablet TAKE 1 TABLET BY MOUTH  DAILY. PLEASE KEEP UPCOMING APPOINTMENT WITH DR. Tamala Julian IN APRIL 2023   metoprolol succinate (TOPROL-XL) 25 MG 24 hr tablet TAKE 1 TABLET (25 MG TOTAL) BY MOUTH DAILY. PLEASE KEEP UPCOMING APPT IN APRIL   triamcinolone cream (KENALOG) 0.1 % Apply 1 Application topically daily as needed (dry skin).   valACYclovir (VALTREX) 1000 MG tablet TAKE 2 TABLETS BY MOUTH AT THE ONSET OF A FLARE THEN TAKE 1 TWICE DAILY AS NEEDED FOR FEVER BLISTERS     Allergies:   Eliquis [apixaban], Food, Glutamic acid, Grass pollen(k-o-r-t-swt vern), Ciprofloxacin, and Pollen extract   Social History   Socioeconomic History   Marital status: Divorced    Spouse name: Not on file   Number of children: Not on file   Years of education: Not on file   Highest education level: Not on file  Occupational History   Occupation: sales  Tobacco Use   Smoking status: Former    Packs/day: 2.00    Years: 20.00    Total pack years: 40.00    Types: Cigarettes    Quit date: 03/27/1990    Years since quitting: 31.8   Smokeless tobacco: Never  Vaping Use   Vaping Use: Never used  Substance and Sexual Activity   Alcohol use: Yes    Comment: Social   Drug use: No   Sexual activity: Not Currently  Other Topics Concern   Not on file  Social History Narrative   Not on file   Social Determinants of Health   Financial Resource Strain: Low Risk  (10/09/2021)   Overall Financial Resource Strain (CARDIA)    Difficulty of Paying Living Expenses: Not hard at all  Food Insecurity: No Food Insecurity (10/09/2021)   Hunger Vital Sign    Worried About Running Out of Food in the Last Year: Never true    [place] in the Last Year: Never true  Transportation Needs: No Transportation Needs (10/09/2021)   PRAPARE - Hydrologist (Medical): No    Lack of Transportation (Non-Medical): No  Physical Activity: Insufficiently Active (10/09/2021)   Exercise Vital Sign    Days of Exercise per Week: 2 days     Minutes of Exercise per Session: 40 min  Stress: No Stress Concern Present (10/09/2021)   [place]    Feeling of Stress : Not at all  Social Connections: Socially Isolated (10/09/2021)   Social Connection and Isolation Panel [NHANES]    Frequency of Communication with Friends and Family: More than three times a week    Frequency of Social Gatherings with Friends and Family: More than three times a  week    Attends Religious Services: Never    Active Member of Clubs or Organizations: No    Attends Music therapist: Never    Marital Status: Divorced     Family History: The patient's family history includes Emphysema in an other family member; Heart attack (age of onset: 43) in his father; Heart disease in his maternal grandfather and paternal grandfather; Other in his brother and mother.  ROS:   Please see the history of present illness.     All other systems reviewed and are negative.  EKGs/Labs/Other Studies Reviewed:    The following studies were reviewed today:   EKG:  Jan. 19, 2024.   NSR at 90 .   NS ST abn.   Recent Labs: 12/25/2021: BUN 19; Creatinine, Ser 1.11; Hemoglobin 14.3; Platelets 166; Potassium 4.3; Sodium 136  Recent Lipid Panel    Component Value Date/Time   CHOL 158 07/25/2020 1016   CHOL 145 05/11/2019 0913   TRIG 229.0 (H) 07/25/2020 1016   HDL 43.30 07/25/2020 1016   HDL 33 (L) 05/11/2019 0913   CHOLHDL 4 07/25/2020 1016   VLDL 45.8 (H) 07/25/2020 1016   LDLCALC 77 05/11/2019 0913   LDLDIRECT 80.0 07/25/2020 1016     Risk Assessment/Calculations:      HYPERTENSION CONTROL Vitals:   02/06/22 1105 02/06/22 1115  BP: (!) 156/86 (!) 140/82    The patient's blood pressure is elevated above target today.  In order to address the patient's elevated BP: Blood pressure will be monitored at home to determine if medication changes need to be made.            Physical  Exam:    VS:  BP (!) 140/82   Pulse 90   Ht '6\' 2"'$  (1.88 m)   Wt 221 lb (100.2 kg)   SpO2 97%   BMI 28.37 kg/m     Wt Readings from Last 3 Encounters:  02/06/22 221 lb (100.2 kg)  12/25/21 217 lb (98.4 kg)  12/23/21 219 lb (99.3 kg)     GEN:  Well nourished, well developed in no acute distress HEENT: Normal NECK: No JVD; No carotid bruits LYMPHATICS: No lymphadenopathy CARDIAC: RRR, no murmurs, rubs, gallops RESPIRATORY:  Clear to auscultation without rales, wheezing or rhonchi  ABDOMEN: Soft, non-tender, non-distended MUSCULOSKELETAL:  No edema; No deformity  SKIN: Warm and dry NEUROLOGIC:  Alert and oriented x 3 PSYCHIATRIC:  Normal affect   ASSESSMENT:    1. Essential hypertension   2. PAF (paroxysmal atrial fibrillation) ([place])   3. Therapeutic drug monitoring   4. Hyperlipidemia LDL goal <70   5. Coronary artery disease involving native coronary artery of native heart without angina pectoris   6. On amiodarone therapy    PLAN:        PAF :   I would like to have him see one of our electrophysiologist to discuss watchman.  In addition, I would like to potentially lower his dose of amiodarone.  I would appreciate the electrophysiology opinion regarding that as well. B[place] is 23 ( age, CAD, HTN)     2.  DVT: He is had venous duplex scan.  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  He did not want to be started on DOAC ( previous bleed on anticoagulation )   so he is been given compression hose.  I have discussed the case with Dr. Scot Dock and agree with the decision for compression hose Consider IVC filter or another  trial of [place] if he has recurrent DVT.          Medication Adjustments/Labs and Tests Ordered: Current medicines are reviewed at length with the patient today.  Concerns regarding medicines are outlined above.  Orders Placed This Encounter  Procedures   Lipid panel   ALT   Comprehensive metabolic panel   TSH   Ambulatory referral to Cardiac  Electrophysiology   EKG 12-Lead   Pulmonary Function Test   No orders of the defined types were placed in this encounter.    Patient Instructions  Medication Instructions:  Your physician recommends that you continue on your current medications as directed. Please refer to the Current Medication list given to you today.  *If you need a refill on your cardiac medications before your next appointment, please call your pharmacy*  Lab Work: Lipids, ALT, CMET, TSH today If you have labs (blood work) drawn today and your tests are completely normal, you will receive your results only by: [place] (if you have MyChart) OR A paper copy in the mail If you have any lab test that is abnormal or we need to change your treatment, we will call you to review the results.  Testing/Procedures: Ambulatory referral to Cardiac Electrophysiology Qu[facility] Ore) for Watchman consideration  Pulmonary Function Testing (PFT's) Your physician has recommended that you have a pulmonary function test. Pulmonary Function Tests are a group of tests that measure how well air moves in and out of your lungs.  Follow-Up: At [facility], you and your health needs are our priority.  As part of our continuing mission to provide you with exceptional heart care, we have created designated Provider Care Teams.  These Care Teams include your primary Cardiologist (physician) and Advanced Practice Providers (APPs -  Physician Assistants and Nurse Practitioners) who all work together to provide you with the care you need, when you need it.  Your next appointment:   6 month(s)  Provider:   Mertie Moores, MD    For your  leg edema you  should do  the following 1. Leg elevation - I recommend the Lounge Dr. Leg rest.  See below for details  2. Salt restriction  -  Use potassium chloride instead of regular salt as a salt substitute. 3. Walk regularly 4. Compression hose - Medical Supply store  5. Weight loss     Available on [email or web address] Or  Go to [email or web address]       Signed, Mertie Moores, MD  02/06/2022 5:50 PM    [place]

## 2022-02-06 ENCOUNTER — Encounter: Payer: Self-pay | Admitting: Cardiovascular Disease

## 2022-02-06 ENCOUNTER — Ambulatory Visit: Payer: Medicare Other | Attending: Cardiovascular Disease | Admitting: Cardiovascular Disease

## 2022-02-06 VITALS — BP 140/82 | HR 90 | Ht 74.0 in | Wt 221.0 lb

## 2022-02-06 DIAGNOSIS — I10 Essential (primary) hypertension: Secondary | ICD-10-CM

## 2022-02-06 DIAGNOSIS — I2510 Atherosclerotic heart disease of native coronary artery without angina pectoris: Secondary | ICD-10-CM

## 2022-02-06 DIAGNOSIS — Z79899 Other long term (current) drug therapy: Secondary | ICD-10-CM

## 2022-02-06 DIAGNOSIS — Z5181 Encounter for therapeutic drug level monitoring: Secondary | ICD-10-CM

## 2022-02-06 DIAGNOSIS — I480 Paroxysmal atrial fibrillation: Secondary | ICD-10-CM | POA: Diagnosis not present

## 2022-02-06 DIAGNOSIS — E785 Hyperlipidemia, unspecified: Secondary | ICD-10-CM

## 2022-02-06 NOTE — Patient Instructions (Signed)
Medication Instructions:  Your physician recommends that you continue on your current medications as directed. Please refer to the Current Medication list given to you today.  *If you need a refill on your cardiac medications before your next appointment, please call your pharmacy*  Lab Work: Lipids, ALT, CMET, TSH today If you have labs (blood work) drawn today and your tests are completely normal, you will receive your results only by: [place] (if you have MyChart) OR A paper copy in the mail If you have any lab test that is abnormal or we need to change your treatment, we will call you to review the results.  Testing/Procedures: Ambulatory referral to Cardiac Electrophysiology Quentin Ore) for Watchman consideration  Pulmonary Function Testing (PFT's) Your physician has recommended that you have a pulmonary function test. Pulmonary Function Tests are a group of tests that measure how well air moves in and out of your lungs.  Follow-Up: At [facility], you and your health needs are our priority.  As part of our continuing mission to provide you with exceptional heart care, we have created designated Provider Care Teams.  These Care Teams include your primary Cardiologist (physician) and Advanced Practice Providers (APPs -  Physician Assistants and Nurse Practitioners) who all work together to provide you with the care you need, when you need it.  Your next appointment:   6 month(s)  Provider:   Mertie Moores, MD    For your  leg edema you  should do  the following 1. Leg elevation - I recommend the Lounge Dr. Leg rest.  See below for details  2. Salt restriction  -  Use potassium chloride instead of regular salt as a salt substitute. 3. Walk regularly 4. Compression hose - Medical Supply store  5. Weight loss    Available on [email or web address] Or  Go to [email or web address]

## 2022-02-07 LAB — LIPID PANEL
Chol/HDL Ratio: 3.6 ratio (ref 0.0–5.0)
Cholesterol, Total: 162 mg/dL (ref 100–199)
HDL: 45 mg/dL (ref >39)
LDL Chol Calc (NIH): 90 mg/dL (ref 0–99)
Triglycerides: 157 mg/dL — ABNORMAL HIGH (ref 0–149)
VLDL Cholesterol Cal: 27 mg/dL (ref 5–40)

## 2022-02-07 LAB — COMPREHENSIVE METABOLIC PANEL
ALT: 10 IU/L (ref 0–44)
AST: 13 IU/L (ref 0–40)
Albumin/Globulin Ratio: 1.6 (ref 1.2–2.2)
Albumin: 4.3 g/dL (ref 3.8–4.8)
Alkaline Phosphatase: 135 IU/L — ABNORMAL HIGH (ref 44–121)
BUN/Creatinine Ratio: 17 (ref 10–24)
BUN: 15 mg/dL (ref 8–27)
Bilirubin Total: 0.2 mg/dL (ref 0.0–1.2)
CO2: 23 mmol/L (ref 20–29)
Calcium: 9.7 mg/dL (ref 8.6–10.2)
Chloride: 102 mmol/L (ref 96–106)
Creatinine, Ser: 0.9 mg/dL (ref 0.76–1.27)
Globulin, Total: 2.7 g/dL (ref 1.5–4.5)
Glucose: 88 mg/dL (ref 70–99)
Potassium: 4.7 mmol/L (ref 3.5–5.2)
Sodium: 140 mmol/L (ref 134–144)
Total Protein: 7 g/dL (ref 6.0–8.5)
eGFR: 88 mL/min/{1.73_m2} (ref >59)

## 2022-02-07 LAB — TSH: TSH: 1.29 u[IU]/mL (ref 0.450–4.500)

## 2022-02-10 ENCOUNTER — Encounter (HOSPITAL_COMMUNITY)
Admission: RE | Disposition: A | Discharge status: Home / Self Care | Payer: Self-pay | Source: Home / Self Care | Attending: Gastroenterology

## 2022-02-10 ENCOUNTER — Encounter (HOSPITAL_COMMUNITY): Payer: Self-pay | Admitting: Gastroenterology

## 2022-02-10 ENCOUNTER — Ambulatory Visit (HOSPITAL_COMMUNITY): Payer: Medicare Other | Admitting: Anesthesiology

## 2022-02-10 ENCOUNTER — Other Ambulatory Visit: Payer: Self-pay

## 2022-02-10 ENCOUNTER — Ambulatory Visit (HOSPITAL_COMMUNITY)
Admission: RE | Admit: 2022-02-10 | Discharge: 2022-02-10 | Disposition: A | Discharge status: Home / Self Care | Payer: Medicare Other | Attending: Gastroenterology | Admitting: Gastroenterology

## 2022-02-10 ENCOUNTER — Ambulatory Visit (HOSPITAL_BASED_OUTPATIENT_CLINIC_OR_DEPARTMENT_OTHER): Payer: Medicare Other | Admitting: Anesthesiology

## 2022-02-10 DIAGNOSIS — Z79899 Other long term (current) drug therapy: Secondary | ICD-10-CM | POA: Insufficient documentation

## 2022-02-10 DIAGNOSIS — I4891 Unspecified atrial fibrillation: Secondary | ICD-10-CM | POA: Insufficient documentation

## 2022-02-10 DIAGNOSIS — I509 Heart failure, unspecified: Secondary | ICD-10-CM | POA: Diagnosis not present

## 2022-02-10 DIAGNOSIS — K219 Gastro-esophageal reflux disease without esophagitis: Secondary | ICD-10-CM | POA: Diagnosis not present

## 2022-02-10 DIAGNOSIS — D120 Benign neoplasm of cecum: Secondary | ICD-10-CM

## 2022-02-10 DIAGNOSIS — M1990 Unspecified osteoarthritis, unspecified site: Secondary | ICD-10-CM | POA: Insufficient documentation

## 2022-02-10 DIAGNOSIS — Z87891 Personal history of nicotine dependence: Secondary | ICD-10-CM | POA: Insufficient documentation

## 2022-02-10 DIAGNOSIS — D124 Benign neoplasm of descending colon: Secondary | ICD-10-CM

## 2022-02-10 DIAGNOSIS — Z1211 Encounter for screening for malignant neoplasm of colon: Secondary | ICD-10-CM | POA: Diagnosis not present

## 2022-02-10 DIAGNOSIS — E785 Hyperlipidemia, unspecified: Secondary | ICD-10-CM | POA: Insufficient documentation

## 2022-02-10 DIAGNOSIS — K6389 Other specified diseases of intestine: Secondary | ICD-10-CM | POA: Diagnosis not present

## 2022-02-10 DIAGNOSIS — K640 First degree hemorrhoids: Secondary | ICD-10-CM | POA: Diagnosis not present

## 2022-02-10 DIAGNOSIS — I2510 Atherosclerotic heart disease of native coronary artery without angina pectoris: Secondary | ICD-10-CM | POA: Insufficient documentation

## 2022-02-10 DIAGNOSIS — K5730 Diverticulosis of large intestine without perforation or abscess without bleeding: Secondary | ICD-10-CM | POA: Insufficient documentation

## 2022-02-10 DIAGNOSIS — I110 Hypertensive heart disease with heart failure: Secondary | ICD-10-CM | POA: Insufficient documentation

## 2022-02-10 DIAGNOSIS — Z86010 Personal history of colonic polyps: Secondary | ICD-10-CM | POA: Insufficient documentation

## 2022-02-10 DIAGNOSIS — Z86718 Personal history of other venous thrombosis and embolism: Secondary | ICD-10-CM | POA: Insufficient documentation

## 2022-02-10 DIAGNOSIS — K635 Polyp of colon: Secondary | ICD-10-CM | POA: Diagnosis not present

## 2022-02-10 DIAGNOSIS — Z09 Encounter for follow-up examination after completed treatment for conditions other than malignant neoplasm: Secondary | ICD-10-CM | POA: Diagnosis not present

## 2022-02-10 DIAGNOSIS — I25119 Atherosclerotic heart disease of native coronary artery with unspecified angina pectoris: Secondary | ICD-10-CM | POA: Diagnosis not present

## 2022-02-10 DIAGNOSIS — I252 Old myocardial infarction: Secondary | ICD-10-CM | POA: Insufficient documentation

## 2022-02-10 HISTORY — PX: COLONOSCOPY WITH PROPOFOL: SHX5780

## 2022-02-10 HISTORY — PX: POLYPECTOMY: SHX5525

## 2022-02-10 SURGERY — COLONOSCOPY WITH PROPOFOL
Anesthesia: Monitor Anesthesia Care

## 2022-02-10 MED ORDER — LACTATED RINGERS IV SOLN
INTRAVENOUS | Status: AC | PRN
  Administered 2022-02-10: 1000 mL via INTRAVENOUS

## 2022-02-10 MED ORDER — SODIUM CHLORIDE 0.9 % IV SOLN: INTRAVENOUS | Status: DC

## 2022-02-10 MED ORDER — PROPOFOL 500 MG/50ML IV EMUL
INTRAVENOUS | Status: DC | PRN
  Administered 2022-02-10: 125 ug/kg/min via INTRAVENOUS

## 2022-02-10 MED ORDER — PHENYLEPHRINE 80 MCG/ML (10ML) SYRINGE FOR IV PUSH (FOR BLOOD PRESSURE SUPPORT)
PREFILLED_SYRINGE | INTRAVENOUS | Status: DC | PRN
  Administered 2022-02-10: 240 ug via INTRAVENOUS

## 2022-02-10 MED ORDER — LIDOCAINE 2% (20 MG/ML) 5 ML SYRINGE
INTRAMUSCULAR | Status: DC | PRN
  Administered 2022-02-10: 40 mg via INTRAVENOUS

## 2022-02-10 MED ORDER — LACTATED RINGERS IV SOLN: INTRAVENOUS | Status: DC | PRN

## 2022-02-10 MED ORDER — PROPOFOL 10 MG/ML IV BOLUS
INTRAVENOUS | Status: DC | PRN
  Administered 2022-02-10: 40 mg via INTRAVENOUS
  Administered 2022-02-10 (×2): 20 mg via INTRAVENOUS

## 2022-02-10 SURGICAL SUPPLY — 22 items

## 2022-02-10 NOTE — Anesthesia Procedure Notes (Signed)
Procedure Name: MAC Date/Time: 02/10/2022 10:54 AM  Performed by: Cynda Familia, CRNAPre-anesthesia Checklist: Patient identified, Emergency Drugs available, Suction available, Patient being monitored and Timeout performed Patient Re-evaluated:Patient Re-evaluated prior to induction Oxygen Delivery Method: Simple face mask Placement Confirmation: breath sounds checked- equal and bilateral and positive ETCO2 Dental Injury: Teeth and Oropharynx as per pre-operative assessment

## 2022-02-10 NOTE — Op Note (Signed)
[facility] Patient Name: Jonathan Holden Procedure Date: 02/10/2022 MRN: [id number] Attending MD: [name] , MD, [id number] Date of Birth: 28-Feb-1945 CSN: [id number] Age: 77 Admit Type: Outpatient Procedure:                Colonoscopy Indications:              High risk colon cancer surveillance: Personal                            history of colonic polyps, Last colonoscopy: March                            2016 Providers:                [name], MD, Jamison Neighbor RN, RN,                            [facility] Technician, Technician Referring MD:              Medicines:                Propofol per Anesthesia, Monitored Anesthesia Care Complications:            No immediate complications. Estimated Blood Loss:     Estimated blood loss was minimal. Procedure:                Pre-Anesthesia Assessment:                           - Prior to the procedure, a History and Physical                            was performed, and patient medications and                            allergies were reviewed. The patient's tolerance of                            previous anesthesia was also reviewed. The risks                            and benefits of the procedure and the sedation                            options and risks were discussed with the patient.                            All questions were answered, and informed consent                            was obtained. Prior Anticoagulants: The patient has                            taken no anticoagulant or antiplatelet agents. ASA  Grade Assessment: III - A patient with severe                            systemic disease. After reviewing the risks and                            benefits, the patient was deemed in satisfactory                            condition to undergo the procedure.                           After obtaining informed consent, the colonoscope                             was passed under direct vision. Throughout the                            procedure, the patient's blood pressure, pulse, and                            oxygen saturations were monitored continuously. The                            PCF-HQ190L ([id number]) Olympus colonoscope was                            introduced through the anus and advanced to the the                            cecum, identified by appendiceal orifice and                            ileocecal valve. The colonoscopy was performed with                            difficulty due to multiple diverticula in the                            colon, inadequate bowel prep and a tortuous colon.                            Successful completion of the procedure was aided by                            straightening and shortening the scope to obtain                            bowel loop reduction and lavage. The patient                            tolerated the procedure well. The quality of the  bowel preparation was fair except the sigmoid colon                            was unsatisfactory. The terminal ileum, ileocecal                            valve, appendiceal orifice, and rectum were                            photographed. Scope In: 11:04:03 AM Scope Out: 11:25:59 AM Scope Withdrawal Time: 0 hours 11 minutes 30 seconds  Total Procedure Duration: 0 hours 21 minutes 56 seconds  Findings:      The perianal and digital rectal examinations were normal.      A moderate amount of semi-solid solid stool was found in the rectum, in       the recto-sigmoid colon, in the sigmoid colon and in the descending       colon, interfering with visualization. Lavage of the area was performed,       resulting in clearance with fair visualization.      A 2 mm polyp was found in the cecum. The polyp was flat. The polyp was       removed with a cold biopsy forceps. Resection and retrieval were       complete.  Estimated blood loss was minimal.      Two semi-sessile polyps were found in the descending colon. The polyps       were 3 to 4 mm in size. These polyps were removed with a cold biopsy       forceps. Resection and retrieval were complete. Estimated blood loss was       minimal.      Multiple large-mouthed, medium-mouthed and small-mouthed diverticula       were found in the entire colon.      Internal hemorrhoids were found during retroflexion. The hemorrhoids       were medium-sized and Grade I (internal hemorrhoids that do not       prolapse).      The terminal ileum appeared normal. Impression:               - Stool in the rectum, in the recto-sigmoid colon,                            in the sigmoid colon and in the descending colon.                           - One 2 mm polyp in the cecum, removed with a cold                            biopsy forceps. Resected and retrieved.                           - Two 3 to 4 mm polyps in the descending colon,                            removed with a cold biopsy forceps. Resected and  retrieved.                           - Diverticulosis in the entire examined colon.                           - Internal hemorrhoids.                           - The examined portion of the ileum was normal. Moderate Sedation:      N/A - MAC procedure Recommendation:           - Patient has a contact number available for                            emergencies. The signs and symptoms of potential                            delayed complications were discussed with the                            patient. Return to normal activities tomorrow.                            Written discharge instructions were provided to the                            patient.                           - High fiber diet.                           - Await pathology results.                           - Repeat colonoscopy for surveillance based on                             pathology results. Procedure Code(s):        --- Professional ---                           [phone], Colonoscopy, flexible; with biopsy, single                            or multiple Diagnosis Code(s):        --- Professional ---                           Z86.010, Personal history of colonic polyps                           D12.0, Benign neoplasm of cecum                           D12.4, Benign neoplasm of descending colon  K64.0, First degree hemorrhoids                           K57.30, Diverticulosis of large intestine without                            perforation or abscess without bleeding CPT copyright 2022 American Medical Association. All rights reserved. The codes documented in this report are preliminary and upon coder review may  be revised to meet current compliance requirements. [name], MD 02/10/2022 11:37:07 AM This report has been signed electronically. Number of Addenda: 0

## 2022-02-10 NOTE — Anesthesia Preprocedure Evaluation (Signed)
Anesthesia Evaluation  Patient identified by MRN, date of birth, ID band Patient awake    Reviewed: Allergy & Precautions, NPO status , Patient's Chart, lab work & pertinent test results, reviewed documented beta blocker date and time   Airway Mallampati: III  TM Distance: >3 FB Neck ROM: Full    Dental no notable dental hx. (+) Teeth Intact, Dental Advisory Given   Pulmonary neg pulmonary ROS, former smoker   Pulmonary exam normal breath sounds clear to auscultation       Cardiovascular hypertension, Pt. on medications and Pt. 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 Not anticoagulated because of previous GI Bleed while on anticoagulants.     Neuro/Psych negative neurological ROS  negative psych ROS   GI/Hepatic Neg liver ROS,GERD  Medicated,,Hx/o colon polyps   Endo/Other  Hyperlipidemia  Renal/GU negative Renal ROS  negative genitourinary   Musculoskeletal  (+) Arthritis , Osteoarthritis,  Post op right total shoulder arthroplasty   Abdominal   Peds  Hematology negative hematology ROS (+)   Anesthesia Other Findings   Reproductive/Obstetrics                              Anesthesia Physical Anesthesia Plan  ASA: 3  Anesthesia Plan: MAC   Post-op Pain Management: Minimal or no pain anticipated   Induction: Intravenous  PONV Risk Score and Plan: 2 and Treatment may vary due to age or medical condition and Propofol infusion  Airway Management Planned: Natural Airway, Nasal Cannula and Simple Face Mask  Additional Equipment: None  Intra-op Plan:   Post-operative Plan:   Informed Consent: I have reviewed the patients History and Physical, chart, labs and discussed the  procedure including the risks, benefits and alternatives for the proposed anesthesia with the patient or authorized representative who has indicated his/her understanding and acceptance.     Dental advisory given  Plan Discussed with: CRNA and Anesthesiologist  Anesthesia Plan Comments:          Anesthesia Quick Evaluation

## 2022-02-10 NOTE — Anesthesia Postprocedure Evaluation (Signed)
Anesthesia Post Note  Patient: Jonathan Holden  Procedure(s) Performed: COLONOSCOPY WITH PROPOFOL POLYPECTOMY     Patient location during evaluation: PACU Anesthesia Type: MAC Level of consciousness: awake and alert and oriented Pain management: pain level controlled Vital Signs Assessment: post-procedure vital signs reviewed and stable Respiratory status: spontaneous breathing, nonlabored ventilation and respiratory function stable Cardiovascular status: stable and blood pressure returned to baseline Postop Assessment: no apparent nausea or vomiting Anesthetic complications: no   No notable events documented.  Last Vitals:  Vitals:   02/10/22 1150 02/10/22 1200  BP: 113/63 131/76  Pulse: 70 69  Resp: 12 16  Temp:    SpO2: 100% 98%    Last Pain:  Vitals:   02/10/22 1200  TempSrc:   PainSc: 0-No pain                 Yaslene Lindamood A.

## 2022-02-10 NOTE — Interval H&P Note (Signed)
History and Physical Interval Note:  02/10/2022 10:51 AM  Jonathan Holden  has presented today for surgery, with the diagnosis of History of colon polyps.  The various methods of treatment have been discussed with the patient and family. After consideration of risks, benefits and other options for treatment, the patient has consented to  Procedure(s): COLONOSCOPY WITH PROPOFOL (N/A) as a surgical intervention.  The patient's history has been reviewed, patient examined, no change in status, stable for surgery.  I have reviewed the patient's chart and labs.  Questions were answered to the patient's satisfaction.     [name]

## 2022-02-10 NOTE — H&P (Signed)
Date of Initial H&P: 02/04/22  History reviewed, patient examined, no change in status, stable for surgery.

## 2022-02-10 NOTE — Transfer of Care (Signed)
Immediate Anesthesia Transfer of Care Note  Patient: Jonathan Holden  Procedure(s) Performed: COLONOSCOPY WITH PROPOFOL POLYPECTOMY  Patient Location: PACU and Endoscopy Unit  Anesthesia Type:MAC  Level of Consciousness: awake  Airway & Oxygen Therapy: Patient Spontanous Breathing and Patient connected to face mask oxygen  Post-op Assessment: Report given to RN and Post -op Vital signs reviewed and stable  Post vital signs: Reviewed and stable  Last Vitals:  Vitals Value Taken Time  BP    Temp    Pulse 66 02/10/22 1132  Resp 24 02/10/22 1132  SpO2 99 % 02/10/22 1132  Vitals shown include unvalidated device data.  Last Pain:  Vitals:   02/10/22 0944  TempSrc: Temporal  PainSc: 0-No pain         Complications: No notable events documented.

## 2022-02-10 NOTE — Discharge Instructions (Signed)
YOU HAD AN ENDOSCOPIC PROCEDURE TODAY: Refer to the procedure report and other information in the discharge instructions given to you for any specific questions about what was found during the examination. If this information does not answer your questions, please call [facility] office at [phone] to clarify.  ? ?YOU SHOULD EXPECT: Some feelings of bloating in the abdomen. Passage of more gas than usual. Walking can help get rid of the air that was put into your GI tract during the procedure and reduce the bloating. If you had a lower endoscopy (such as a colonoscopy or flexible sigmoidoscopy) you may notice spotting of blood in your stool or on the toilet paper. Some abdominal soreness may be present for a day or two, also. ? ?DIET: Your first meal following the procedure should be a light meal and then it is ok to progress to your normal diet. A half-sandwich or bowl of soup is an example of a good first meal. Heavy or fried foods are harder to digest and may make you feel nauseous or bloated. Drink plenty of fluids but you should avoid alcoholic beverages for 24 hours.   ? ?ACTIVITY: Your care partner should take you home directly after the procedure. You should plan to take it easy, moving slowly for the rest of the day. You can resume normal activity the day after the procedure however YOU SHOULD NOT DRIVE, use power tools, machinery or perform tasks that involve climbing or major physical exertion for 24 hours (because of the sedation medicines used during the test).  ? ?SYMPTOMS TO REPORT IMMEDIATELY: ?A gastroenterologist can be reached at any hour. Please call [phone]  for any of the following symptoms:  ?Following lower endoscopy (colonoscopy, flexible sigmoidoscopy) ?Excessive amounts of blood in the stool  ?Significant tenderness, worsening of abdominal pains  ?Swelling of the abdomen that is new, acute  ?Fever of 100? or higher  ? ? ?FOLLOW UP:  ?If any biopsies were taken you will be contacted  by phone or by letter within the next 1-3 weeks. Call [phone]  if you have not heard about the biopsies in 3 weeks.  ?Please also call with any specific questions about appointments or follow up tests. YOU HAD AN ENDOSCOPIC PROCEDURE TODAY: Refer to the procedure report and other information in the discharge instructions given to you for any specific questions about what was found during the examination. If this information does not answer your questions, please call [facility] office at [phone] to clarify.   ?

## 2022-02-11 ENCOUNTER — Encounter (HOSPITAL_COMMUNITY): Payer: Self-pay | Admitting: Gastroenterology

## 2022-02-11 LAB — SURGICAL PATHOLOGY

## 2022-02-19 DIAGNOSIS — Z4789 Encounter for other orthopedic aftercare: Secondary | ICD-10-CM | POA: Diagnosis not present

## 2022-02-24 ENCOUNTER — Telehealth: Payer: Self-pay | Admitting: Cardiovascular Disease

## 2022-02-24 ENCOUNTER — Ambulatory Visit (HOSPITAL_COMMUNITY)
Admission: RE | Admit: 2022-02-24 | Discharge: 2022-02-24 | Disposition: A | Discharge status: Home / Self Care | Payer: Medicare Other | Source: Ambulatory Visit | Attending: Cardiovascular Disease | Admitting: Cardiovascular Disease

## 2022-02-24 DIAGNOSIS — Z5181 Encounter for therapeutic drug level monitoring: Secondary | ICD-10-CM | POA: Diagnosis not present

## 2022-02-24 DIAGNOSIS — Z79899 Other long term (current) drug therapy: Secondary | ICD-10-CM | POA: Diagnosis not present

## 2022-02-24 DIAGNOSIS — M7989 Other specified soft tissue disorders: Secondary | ICD-10-CM

## 2022-02-24 LAB — PULMONARY FUNCTION TEST
DL/VA % pred: 85 %
DL/VA: 3.3 ml/min/mmHg/L
DLCO unc % pred: 77 %
DLCO unc: 21.76 ml/min/mmHg
FEF 25-75 Post: 2.76 L/sec
FEF 25-75 Pre: 2.5 L/sec
FEF2575-%Change-Post: 10 %
FEF2575-%Pred-Post: 110 %
FEF2575-%Pred-Pre: 99 %
FEV1-%Change-Post: -1 %
FEV1-%Pred-Post: 103 %
FEV1-%Pred-Pre: 104 %
FEV1-Post: 3.62 L
FEV1-Pre: 3.66 L
FEV1FVC-%Change-Post: 8 %
FEV1FVC-%Pred-Pre: 104 %
FEV6-%Change-Post: -8 %
FEV6-%Pred-Post: 95 %
FEV6-%Pred-Pre: 104 %
FEV6-Post: 4.36 L
FEV6-Pre: 4.79 L
FEV6FVC-%Change-Post: 0 %
FEV6FVC-%Pred-Post: 104 %
FEV6FVC-%Pred-Pre: 105 %
FVC-%Change-Post: -8 %
FVC-%Pred-Post: 91 %
FVC-%Pred-Pre: 99 %
FVC-Post: 4.42 L
FVC-Pre: 4.84 L
Post FEV1/FVC ratio: 82 %
Post FEV6/FVC ratio: 99 %
Pre FEV1/FVC ratio: 76 %
Pre FEV6/FVC Ratio: 99 %
RV % pred: 65 %
RV: 1.84 L
TLC % pred: 85 %
TLC: 6.74 L

## 2022-02-24 MED ORDER — ALBUTEROL SULFATE (2.5 MG/3ML) 0.083% IN NEBU
2.5000 mg | INHALATION_SOLUTION | Freq: Once | RESPIRATORY_TRACT | Status: AC
  Administered 2022-02-24: 2.5 mg via RESPIRATORY_TRACT

## 2022-02-24 NOTE — Telephone Encounter (Signed)
Right leg and foot  is swollen and is getting worse.  Also have sore sports. Want to know what the next step will be.

## 2022-02-26 ENCOUNTER — Encounter (HOSPITAL_COMMUNITY): Payer: Self-pay | Admitting: *Deleted

## 2022-02-27 NOTE — Telephone Encounter (Signed)
Returned call to patient who states that his R extremity (foot, ankle, and calf) remain swollen, warm/"feverish" and red.  Pt's PCP ordered US to rule out DVT, pt had bilateral DVT, but Dr Deitra Mayo who read the study stated they were old and recommended compression stockings on 12/23/21. Pt states historically, he has not been able to use blood thinners due to history of GI bleed, but recently on 02/10/22 had polyps removed and is wondering if now he CAN be placed on a blood thinner. He was last seen here on 02/06/22 by Nahser who recommended the lounge doctor, which he uses, along with daily compression hose. States that in the morning, his swelling has decreased by roughly 25%, but never gets better entirely. He feels the hose "keep it at [place]" but swelling comes back by end of day and states his R leg is noticably larger than L. L leg does have some swelling, but no heat and no inflammation per pt report. Will route to MD to see if we can get him a referral to vascular and/or if we should seek starting a blood thinning medication.

## 2022-03-02 NOTE — Telephone Encounter (Signed)
Informed pt of referral to vein and vascular by Dr Elmarie Shiley recommendation to see if they can get him a plan for his swollen leg. No further questions.

## 2022-03-04 ENCOUNTER — Encounter: Payer: Self-pay | Admitting: Vascular Surgery

## 2022-03-04 ENCOUNTER — Ambulatory Visit (INDEPENDENT_AMBULATORY_CARE_PROVIDER_SITE_OTHER): Payer: Medicare Other | Admitting: Vascular Surgery

## 2022-03-04 VITALS — BP 142/88 | HR 87 | Temp 98.9°F | Resp 20 | Ht 74.0 in | Wt 220.8 lb

## 2022-03-04 DIAGNOSIS — I82513 Chronic embolism and thrombosis of femoral vein, bilateral: Secondary | ICD-10-CM

## 2022-03-04 NOTE — Progress Notes (Signed)
Patient ID: Jonathan Holden, male   DOB: 09-14-1945, 77 y.o.   MRN: [id number]  Reason for Consult: No chief complaint on file.   Referred by Nahser, Wonda Cheng, MD  Subjective:     HPI:  Jonathan Holden is a 77 y.o. male without previous vascular history.  He is a former smoker but quit 40 years ago.  Over the past several months he has had right greater than left lower extremity swelling.  He had an ultrasound which demonstrated a chronic DVT.  He does not have any personal or family history of DVT that he is aware of.  He has has not had any venous interventions in the past.  Patient had issues with taking Eliquis causing diverticular bleeding in the past.  Currently does not take any anticoagulants or antiplatelet agents.  He is wearing compression stockings as previously prescribed.  Skin changes around the right ankle have been present for several months and are quite painful particularly when he is not wearing compression stockings.  He also has thin skin and bruises easily.  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   Pseudophakia of left eye 05/22/2013   Pseudophakia of right eye 06/05/2013   S/P coronary angioplasty    Skin cancer    Hands, arms, nose   Streptococcal bacteremia    Family History  Problem Relation Age of Onset   Other Mother        AGE 65 HEALTHY   Heart attack Father 33       2 MI   Heart disease Maternal Grandfather    Heart  disease Paternal Grandfather    Emphysema Other    Other Brother    Past Surgical History:  Procedure Laterality Date   CARDIOVASCULAR STRESS TEST  08/22/2008   CATARACT EXTRACTION W/ INTRAOCULAR LENS IMPLANT Bilateral    COLONOSCOPY WITH PROPOFOL N/A 02/10/2022   Procedure: COLONOSCOPY WITH PROPOFOL;  Surgeon: Wilford Corner, MD;  Location: [facility];  Service: Gastroenterology;  Laterality: N/A;   CORONARY ANGIOPLASTY  1992   ERCP W/ SPHICTEROTOMY  12/15/2002   LAPAROSCOPIC CHOLECYSTECTOMY  12/16/2002   LUMBAR LAMINECTOMY     L5   POLYPECTOMY  02/10/2022   Procedure: POLYPECTOMY;  Surgeon: Wilford Corner, MD;  Location: [facility];  Service: Gastroenterology;;   REVERSE SHOULDER ARTHROPLASTY Right 01/09/2022   Procedure: REVERSE SHOULDER ARTHROPLASTY;  Surgeon: Nicholes Stairs, MD;  Location: [facility];  Service: Orthopedics;  Laterality: Right;  150   RIGHT SHOULDER SURGERY      SKIN CANCER EXCISION     TEE WITHOUT CARDIOVERSION N/A 02/28/2016   Procedure: TRANSESOPHAGEAL ECHOCARDIOGRAM (TEE);  Surgeon: Fay Records, MD;  Location: [facility];  Service: Cardiovascular;  Laterality: N/A;   TOTAL HIP ARTHROPLASTY Left 07/23/2021   Procedure: TOTAL HIP ARTHROPLASTY ANTERIOR APPROACH;  Surgeon: Gaynelle Arabian, MD;  Location: [facility];  Service: Orthopedics;  Laterality: Left;    Short Social History:  Social History   Tobacco Use   Smoking status: Former    Packs/day: 2.00    Years: 20.00    Total pack years: 40.00    Types: Cigarettes    Quit date: 03/27/1990    Years since quitting: 31.9   Smokeless tobacco: Never  Substance Use Topics   Alcohol use: Yes    Comment: Social    Allergies  Allergen Reactions   Eliquis [Apixaban] Other (See Comments)    Recurrent GI bleeding occurred from diverticulosis in the setting of anticoagulation therapy for atrial fibrillation.   Food Other (See Comments)    Pt is allergic to melons caused scratchy throat    Glutamic  Acid Other (See Comments)    Pt is allergic to melons caused scratchy throat    Grass Pollen(K-O-R-T-Swt Vern)     Sneezing    Ciprofloxacin Hives   Pollen Extract Other (See Comments)    Sneezing     Current Outpatient Medications  Medication Sig Dispense Refill   Alirocumab (PRALUENT) 150 MG/ML SOAJ Inject 150 mg into the skin every 14 (fourteen) days.     amiodarone (PACERONE) 200 MG tablet TAKE 1 TABLET (200 MG TOTAL) BY MOUTH DAILY. \ 90 tablet 3   Carboxymethylcellul-Glycerin (LUBRICATING EYE DROPS OP) Place 1 drop into both eyes daily as needed (dry eyes).     cephALEXin (KEFLEX) 500 MG capsule Take 1 capsule (500 mg total) by mouth 2 (two) times daily. Take for 7 days (Patient not taking: Reported on 12/23/2021) 20 capsule 0   cetirizine (ZYRTEC) 10 MG tablet Take 10 mg by mouth daily as needed for allergies.     clobetasol ointment (TEMOVATE) 0.05 % APPLY TO AFFECTED AREA TWICE A DAY 30 g 2   Evolocumab (REPATHA SURECLICK) [id number] MG/ML SOAJ Inject 140 mg into the skin every 14 (fourteen) days. (Patient not taking: Reported on 02/06/2022) 6 mL 3   ketoconazole (NIZORAL) 2 % shampoo APPLY 1 3 TIMES A WEEK TO SCALP. LET SIT 10 MIN THEN RINSE OUT. Strength: 2 % 120 mL 3   lisinopril (ZESTRIL) 10 MG tablet TAKE 1 TABLET BY MOUTH DAILY. PLEASE KEEP UPCOMING APPOINTMENT WITH DR. Tamala Julian IN APRIL 2023 90 tablet 3   metoprolol succinate (TOPROL-XL) 25 MG 24 hr tablet TAKE 1 TABLET (25 MG TOTAL) BY MOUTH DAILY. PLEASE KEEP UPCOMING APPT IN APRIL 90 tablet 3   ondansetron (ZOFRAN) 4 MG tablet Take 1 tablet (4 mg total) by mouth every 8 (eight) hours as needed. (Patient not taking: Reported on 02/05/2022) 20 tablet 0   oxyCODONE (ROXICODONE) 5 MG immediate release tablet Take 1 tablet (5 mg total) by mouth every 6 (six) hours as needed for severe pain. (Patient not taking: Reported on 02/05/2022) 20 tablet 0   triamcinolone cream (KENALOG) 0.1 % Apply 1 Application topically daily as needed (dry skin).  (Patient not taking: Reported on 02/10/2022)     valACYclovir (VALTREX) 1000 MG tablet TAKE 2 TABLETS BY MOUTH AT THE ONSET OF A FLARE THEN TAKE 1 TWICE DAILY AS NEEDED FOR FEVER BLISTERS 252 tablet 1   No current facility-administered medications for this visit.    Review of Systems  Constitutional:  Constitutional negative. HENT: HENT negative.  Eyes: Eyes negative.  Respiratory: Respiratory negative.  Cardiovascular: Positive for leg swelling.  GI: Gastrointestinal negative.  Musculoskeletal: Musculoskeletal negative.  Skin: Positive for rash.  Neurological: Neurological negative. Hematologic: Positive for bruises/bleeds easily.  Psychiatric:  Psychiatric negative.        Objective:  Objective  Vitals:   03/04/22 0925  BP: (!) 142/88  Pulse: 87  Resp: 20  Temp: 98.9 F (37.2 C)  SpO2: 96%     Physical Exam HENT:     Head: Normocephalic.     Nose: Nose normal.  Eyes:     Pupils: Pupils are equal, round, and reactive to light.  Cardiovascular:     Rate and Rhythm: Normal rate.     Pulses: Normal pulses.  Pulmonary:     Effort: Pulmonary effort is normal.  Abdominal:     General: Abdomen is flat.  Musculoskeletal:        General: Normal range of motion.     Cervical back: Normal range of motion and neck supple.     Right lower leg: Edema present.     Left lower leg: No edema.  Skin:    Capillary Refill: Capillary refill takes less than 2 seconds.     Comments: Skin around the right ankle is irritated appears consistent with C4 venous disease  Neurological:     Mental Status: He is alert.  Psychiatric:        Mood and Affect: Mood normal.        Behavior: Behavior normal.        Thought Content: Thought content normal.        Judgment: Judgment normal.     Data: RIGHT    CompressibilityPhasicitySpontaneityProperties  Thrombus  Aging    +---------+---------------+---------+-----------+------------+-------------  ---+  CFV     Full            Yes      Yes                                       +---------+---------------+---------+-----------+------------+-------------  ---+  SFJ     Full           Yes      Yes                                       +---------+---------------+---------+-----------+------------+-------------  ---+  FV Prox  None           No       No         Duplicated  Age                                                            vein         Indeterminate     +---------+---------------+---------+-----------+------------+-------------  ---+  FV Mid   Partial        No       Yes                    Age  Indeterminate     +---------+---------------+---------+-----------+------------+-------------  ---+  FV DistalNone           No       No                     Age                                                                         Indeterminate     +---------+---------------+---------+-----------+------------+-------------  ---+  PFV     Full           Yes      Yes                                       +---------+---------------+---------+-----------+------------+-------------  ---+  POP     Partial        No       Yes                    Age                                                                         Indeterminate     +---------+---------------+---------+-----------+------------+-------------  ---+  PTV     Partial        No                              Age                                                                         Indeterminate     +---------+---------------+---------+-----------+------------+-------------  ---+  PERO    Partial        No                              Age                                                                         Indeterminate      +---------+---------------+---------+-----------+------------+-------------  ---+         +---------+---------------+---------+-----------+---------------+----------  ----+  LEFT    CompressibilityPhasicitySpontaneityProperties     Thrombus  Aging  +---------+---------------+---------+-----------+---------------+----------  ----+  CFV  Partial        Yes      Yes                       Chronic          +---------+---------------+---------+-----------+---------------+----------  ----+  SFJ     Partial        Yes      Yes                       Chronic          +---------+---------------+---------+-----------+---------------+----------  ----+  FV Prox  Partial        Yes      Yes                       Age                                                                          Indeterminate   +---------+---------------+---------+-----------+---------------+----------  ----+  FV Mid   None           No       No         duplicated veinAge                                                                          Indeterminate   +---------+---------------+---------+-----------+---------------+----------  ----+  FV DistalNone           No       No         duplicated vein                 +---------+---------------+---------+-----------+---------------+----------  ----+  PFV     Partial        Yes      Yes                       Chronic          +---------+---------------+---------+-----------+---------------+----------  ----+  POP     None           Yes      Yes        duplicated     Age                                                          vein. One Vein  Indeterminate                                               retrograde flow                 +---------+---------------+---------+-----------+---------------+----------  ----+  PTV     Partial        Yes      Yes                        Age                                                                          Indeterminate   +---------+---------------+---------+-----------+---------------+----------  ----+  PERO    Full           Yes      Yes                                        +---------+---------------+---------+-----------+---------------+----------     Summary:  RIGHT:  - Findings consistent with age indeterminate deep vein thrombosis  involving the right femoral vein, right popliteal vein, right posterior  tibial veins, and right peroneal veins.    LEFT:  - Findings consistent with age indeterminate deep vein thrombosis  involving the left femoral vein, left popliteal vein, and left posterior  tibial veins.  - Findings consistent with chronic deep vein thrombosis involving the left  common femoral vein, SF junction, and left proximal profunda vein.     CT scan from 2022 reviewed with the patient which demonstrates possible compression of the right common iliac vein by the right common iliac artery.  There is also a large bone spur compressing the left common iliac vein although the lumen of the vein appears maintained.     Assessment/Plan:     77 year old male here with right greater than left lower extremity swelling with a history of chronic DVT in both lower extremities.  Patient with adverse reaction to anticoagulants in the past and I recommended baby aspirin daily as well as to continue wearing compression stockings.  Will get him to follow-up with an IVC and right common external neck vein duplex to evaluate for central venous obstructive process and also right lower extremity venous reflux to evaluate for cause of C4 venous disease.  Patient's questions were answered we will see him back in 3 to 4 weeks when ultrasounds are completed.     Waynetta Sandy MD [facility]

## 2022-03-06 ENCOUNTER — Other Ambulatory Visit: Payer: Self-pay

## 2022-03-06 DIAGNOSIS — I82513 Chronic embolism and thrombosis of femoral vein, bilateral: Secondary | ICD-10-CM

## 2022-03-23 NOTE — Progress Notes (Unsigned)
Electrophysiology Office Note:    Date:  03/23/2022   ID:  Jonathan Holden, DOB 09/10/1945, MRN [id number]  [facility] Cardiologist:  Mertie Moores, MD  [place] Electrophysiologist:  Will Meredith Leeds, MD   Referring MD: Acie Fredrickson Wonda Cheng, MD   Chief Complaint: Atrial fibrillation  History of Present Illness:    Jonathan Holden is a 77 y.o. male who I am seeing today for an opinion regarding his atrial fibrillation at the request of Dr. Acie Fredrickson.  The patient's medical history includes coronary artery disease, hypertension, hyperlipidemia, bilateral lower extremity DVTs (unprovoked).  He is seeing vascular surgery for his DVTs.  He is being worked up for possible IVC filter.  He is not currently taking anticoagulation because of a history of GI bleeding while on his anti-coagulation.        Their past medical, social and family history was reveiwed.   ROS:   Please see the history of present illness.    All other systems reviewed and are negative.  EKGs/Labs/Other Studies Reviewed:    The following studies were reviewed today:     Physical Exam:    VS:  There were no vitals taken for this visit.    Wt Readings from Last 3 Encounters:  03/04/22 220 lb 12.8 oz (100.2 kg)  02/10/22 208 lb (94.3 kg)  02/06/22 221 lb (100.2 kg)     GEN: *** Well nourished, well developed in no acute distress CARDIAC: ***RRR, no murmurs, rubs, gallops RESPIRATORY:  Clear to auscultation without rales, wheezing or rhonchi       ASSESSMENT AND PLAN:    1. PAF (paroxysmal atrial fibrillation) ([place])   2. Coronary artery disease involving native coronary artery of native heart without angina pectoris   3. Encounter for long-term (current) use of high-risk medication   4. Chronic diastolic HF (heart failure) (HCC)     #AF #GI bleed hx Not currently anticoagulated given GI bleeding history. Discussed role of LAAO today with the patient. Discussed the role of Watchman in  reducing AF related stroke risk. Discussed need for short term anticoagulation around the time of watchman implant.   ? LE vein patency ? Doesn't remove need for [facility] ? Could consider if IVC filter is recommended treatment for DVT and Watchman would address stroke risk. Could do implant with periprocedural DAPT  -----------------------  I have seen Jonathan Holden in the office today who is being considered for a Watchman left atrial appendage closure device. I believe they will benefit from this procedure given their history of atrial fibrillation, CHA2DS2-VASc score of 4 and unadjusted ischemic stroke rate of 4.8% per year. Unfortunately, the patient is not felt to be a long term anticoagulation candidate secondary to history of GI bleeding. The patient's chart has been reviewed and I feel that they would be a candidate for short term oral anticoagulation after Watchman implant.   It is my belief that after undergoing a LAA closure procedure, Jonathan Holden will not need long term anticoagulation which eliminates anticoagulation side effects and major bleeding risk.   Procedural risks for the Watchman implant have been reviewed with the patient including a 0.5% risk of stroke, <1% risk of perforation and <1% risk of device embolization. Other risks include bleeding, vascular damage, tamponade, worsening renal function, and death. The patient understands these risk and wishes to proceed.     The published clinical data on the safety and effectiveness of WATCHMAN include but are not limited to  the following: - Vertell Limber DR, Mechele Claude, Sick P et al. for the PROTECT AF Investigators. Percutaneous closure of the left atrial appendage versus warfarin therapy for prevention of stroke in patients with atrial fibrillation: a randomised non-inferiority trial. Lancet 2009; 374: 534-42. Mechele Claude, Doshi SK, Abelardo Diesel D et al. on behalf of the PROTECT AF Investigators. Percutaneous Left Atrial Appendage  Closure for Stroke Prophylaxis in Patients With Atrial Fibrillation 2.3-Year Follow-up of the PROTECT AF (Watchman Left Atrial Appendage System for Embolic Protection in Patients With Atrial Fibrillation) Trial. Circulation 2013; 127:720-729. - Alli O, Doshi S,  Kar S, Reddy VY, Sievert H et al. Quality of Life Assessment in the Randomized PROTECT AF (Percutaneous Closure of the Left Atrial Appendage Versus Warfarin Therapy for Prevention of Stroke in Patients With Atrial Fibrillation) Trial of Patients at Risk for Stroke With Nonvalvular Atrial Fibrillation. J Am Coll Cardiol 2013; P4788364. Vertell Limber DR, Tarri Abernethy, Price M, [place], Sievert H, Doshi S, Huber K, Reddy V. Prospective randomized evaluation of the Watchman left atrial appendage Device in patients with atrial fibrillation versus long-term warfarin therapy; the PREVAIL trial. Journal of the [employer] of Cardiology, Vol. 4, No. 1, 2014, 1-11. - Kar S, Doshi SK, Sadhu A, Horton R, Osorio J et al. Primary outcome evaluation of a next-generation left atrial appendage closure device: results from the PINNACLE FLX trial. Circulation 2021;143(18)1754-1762.    After today's visit with the patient which was dedicated solely for shared decision making visit regarding LAA closure device, the patient decided to proceed with the LAA appendage closure procedure scheduled to be done in the near future at [facility] Prior to the procedure, I would like to obtain a gated CT scan of the chest with contrast timed for PV/LA visualization.     HAS-BLED score 3 Hypertension Yes  Abnormal renal and liver function (Dialysis, transplant, Cr >2.26 mg/dL /Cirrhosis or Bilirubin >2x Normal or AST/ALT/AP >3x Normal) No  Stroke No  Bleeding Yes  Labile INR (Unstable/high INR) No  Elderly (>65) Yes  Drugs or alcohol (? 8 drinks/week, anti-plt or NSAID) No   CHA2DS2-VASc Score = 4  The patient's score is based upon: CHF History: 0 HTN History:  1 Diabetes History: 0 Stroke History: 0 Vascular Disease History: 1 Age Score: 2 Gender Score: 0     Signed, Lysbeth Galas T. Quentin Ore, MD, [facility], [facility] 03/23/2022 9:17 PM    Electrophysiology [facility]

## 2022-03-24 ENCOUNTER — Ambulatory Visit: Payer: Medicare Other | Attending: Cardiology | Admitting: Cardiology

## 2022-03-24 ENCOUNTER — Encounter: Payer: Self-pay | Admitting: Cardiology

## 2022-03-24 VITALS — BP 144/70 | HR 78 | Ht 74.0 in | Wt 222.0 lb

## 2022-03-24 DIAGNOSIS — Z79899 Other long term (current) drug therapy: Secondary | ICD-10-CM | POA: Insufficient documentation

## 2022-03-24 DIAGNOSIS — I5032 Chronic diastolic (congestive) heart failure: Secondary | ICD-10-CM | POA: Diagnosis not present

## 2022-03-24 DIAGNOSIS — I480 Paroxysmal atrial fibrillation: Secondary | ICD-10-CM

## 2022-03-24 DIAGNOSIS — I2510 Atherosclerotic heart disease of native coronary artery without angina pectoris: Secondary | ICD-10-CM | POA: Insufficient documentation

## 2022-03-24 NOTE — Patient Instructions (Signed)
Medication Instructions: Your physician recommends that you continue on your current medications as directed. Please refer to the Current Medication list given to you today.  *If you need a refill on your cardiac medications before your next appointment, please call your pharmacy*  Follow-Up: At [facility], you and your health needs are our priority.  As part of our continuing mission to provide you with exceptional heart care, we have created designated Provider Care Teams.  These Care Teams include your primary Cardiologist (physician) and Advanced Practice Providers (APPs -  Physician Assistants and Nurse Practitioners) who all work together to provide you with the care you need, when you need it.  Your next appointment:   2 month(s)  Provider:   Lars Mage, MD

## 2022-03-24 NOTE — Progress Notes (Signed)
Electrophysiology Office Note:    Date:  03/24/2022   ID:  Jonathan Holden 14-Jul-1945, MRN [id number]  [facility] Cardiologist:  Jonathan Moores, MD  [place] Electrophysiologist:  Jonathan Meredith Leeds, MD   Referring MD: Jonathan Holden Jonathan Cheng, MD   Chief Complaint: Atrial fibrillation  History of Present Illness:    Jonathan Holden is a 77 y.o. male who I am seeing today for an opinion regarding his atrial fibrillation at the request of Dr. Acie Holden.  The patient's medical history includes coronary artery disease, hypertension, hyperlipidemia, bilateral lower extremity DVTs (unprovoked).  He is seeing vascular surgery for his DVTs.  He is being worked up for possible IVC filter.  He is not currently taking anticoagulation because of a history of GI bleeding while on his anti-coagulation.  Today, he confirms that he was hospitalized 5-6 years ago with colon polyps. At that time he lost about 6 units of blood after he was placed on anticoagulation. He states that the polyps were removed. Since then he denies any recurring bleeding issues. However, he does bruise easily.  He denies being aware of any history of DVTs prior to his BLE ultrasounds this past December. Currently his RLE shows discoloration of his right shin, which he also states is swollen.   He denies any palpitations, chest pain, or shortness of breath. No lightheadedness, headaches, syncope, orthopnea, or PND.     Their past medical, social and family history was reveiwed.  ROS:   Please see the history of present illness.    (+) Easy bruising (+) RLE discoloration All other systems reviewed and are negative.  EKGs/Labs/Other Studies Reviewed:    The following studies were reviewed today:  February 22, 2016 EKG shows atrial fibrillation   Physical Exam:    VS:  BP (!) 144/70   Pulse 78   Ht '6\' 2"'$  (1.88 m)   Wt 222 lb (100.7 kg)   SpO2 97%   BMI 28.50 kg/m     Wt Readings from Last 3 Encounters:   03/24/22 222 lb (100.7 kg)  03/04/22 220 lb 12.8 oz (100.2 kg)  02/10/22 208 lb (94.3 kg)     GEN: Well nourished, well developed in no acute distress CARDIAC: RRR, no murmurs, rubs, gallops SKIN: RLE>LLE erythema. Very mild LE swelling in RLE compared to the left      ASSESSMENT AND PLAN:    1. PAF (paroxysmal atrial fibrillation) ([place])   2. Coronary artery disease involving native coronary artery of native heart without angina pectoris   3. Encounter for long-term (current) use of high-risk medication   4. Chronic diastolic HF (heart failure) (HCC)     #AF #GI bleed hx Not currently anticoagulated given GI bleeding history. Discussed role of LAAO today with the patient. Discussed the role of Watchman in reducing AF related stroke risk. Discussed need for short term anticoagulation around the time of watchman implant.   He is still undergoing workup by Dr. Donzetta Holden with vascular surgery regarding his history of lower extremity DVT.  If the workup by vascular reveals an ongoing need for anticoagulation to treat DVT, I would favor rechallenging with Eliquis 5 mg by mouth twice daily.  If workup by vascular shows no ongoing need for anticoagulation as a relates to DVT treatment, could continue with workup for possible left atrial appendage occlusion for stroke risk mitigation.  If IVC filter is pursued down the road, favor this being done after left atrial appendage occlusion.  I  Jonathan plan to touch base with Jonathan Holden after his ultrasounds later this month and follow-up with Dr. Donzetta Holden.  We Jonathan finalize a treatment plan at that appointment.  I Jonathan send a note to Dr. Donzetta Holden today so that we can coordinate workup/treatment.    HAS-BLED score 3 Hypertension Yes  Abnormal renal and liver function (Dialysis, transplant, Cr >2.26 mg/dL /Cirrhosis or Bilirubin >2x Normal or AST/ALT/AP >3x Normal) No  Stroke No  Bleeding Yes  Labile INR (Unstable/high INR) No  Elderly (>65) Yes  Drugs or  alcohol (? 8 drinks/week, anti-plt or NSAID) No   CHA2DS2-VASc Score = 4  The patient's score is based upon: CHF History: 0 HTN History: 1 Diabetes History: 0 Stroke History: 0 Vascular Disease History: 1 Age Score: 2 Gender Score: 0  Follow-up with me in 2 months.  I,Jonathan Holden,acting as a Education administrator for Jonathan Epley, MD.,have documented all relevant documentation on the behalf of Jonathan Epley, MD,as directed by  Jonathan Epley, MD while in the presence of Jonathan Epley, MD.  I, Jonathan Epley, MD, have reviewed all documentation for this visit. The documentation on 03/24/22 for the exam, diagnosis, procedures, and orders are all accurate and complete.   Signed, Jonathan Holden. Jonathan Ore, MD, [facility], [facility] 03/24/2022 9:21 AM    Electrophysiology [facility]

## 2022-03-27 ENCOUNTER — Telehealth: Payer: Self-pay | Admitting: Family Medicine

## 2022-03-27 NOTE — Telephone Encounter (Signed)
Spoke with patient stated taking Rx Valacyclovir  as prescribed and not working for blister  Will like to know if is another medication to try  Please advise

## 2022-03-27 NOTE — Telephone Encounter (Signed)
Patient states; -He feels as though Valacyclovir 1000 mg  is not working anymore  - Used to be able to take 2 upon feeling blister forming and took care of it--now it doesn't work  - Requests dosage be increased if possible   Please Advise.

## 2022-03-30 NOTE — Telephone Encounter (Signed)
Please schedule OV per PCP

## 2022-03-30 NOTE — Telephone Encounter (Signed)
Spoke with patient stated Taking 2pills('2000mg'$ ) every 6-8 hr with out help, still feeling outbreak coming out

## 2022-03-30 NOTE — Telephone Encounter (Signed)
Can we clarify dosage he is taking?  It is ok for him to take '2000mg'$  twice daily for 1 day for an outbreak.

## 2022-03-30 NOTE — Telephone Encounter (Signed)
Recommend office visit.  Jonathan Holden. Jerline Pain, MD 03/30/2022 12:12 PM

## 2022-03-31 ENCOUNTER — Encounter: Payer: Self-pay | Admitting: Family Medicine

## 2022-03-31 ENCOUNTER — Ambulatory Visit (INDEPENDENT_AMBULATORY_CARE_PROVIDER_SITE_OTHER): Payer: Medicare Other | Admitting: Family Medicine

## 2022-03-31 VITALS — BP 145/86 | HR 73 | Temp 97.7°F | Ht 74.0 in | Wt 219.6 lb

## 2022-03-31 DIAGNOSIS — R600 Localized edema: Secondary | ICD-10-CM | POA: Diagnosis not present

## 2022-03-31 DIAGNOSIS — B001 Herpesviral vesicular dermatitis: Secondary | ICD-10-CM

## 2022-03-31 DIAGNOSIS — I10 Essential (primary) hypertension: Secondary | ICD-10-CM

## 2022-03-31 MED ORDER — ACYCLOVIR 400 MG PO TABS: 400.0000 mg | ORAL_TABLET | Freq: Three times a day (TID) | ORAL | 3 refills | Status: DC

## 2022-03-31 NOTE — Assessment & Plan Note (Signed)
Likely secondary to venous insufficiency.  Is following with vascular surgery and has lower extremity ultrasound tomorrow.

## 2022-03-31 NOTE — Patient Instructions (Signed)
It was very nice to see you today!  Please try the acyclovir.  Let us know if not improving.  Keep an eye on your blood pressure and let us know if persistently elevated.  Take care, Dr Jerline Pain  PLEASE NOTE:  If you had any lab tests, please let us know if you have not heard back within a few days. You may see your results on mychart before we have a chance to review them but we will give you a call once they are reviewed by [place].   If we ordered any referrals today, please let us know if you have not heard from their office within the next week.   If you had any urgent prescriptions sent in today, please check with the pharmacy within an hour of our visit to make sure the prescription was transmitted appropriately.   Please try these tips to maintain a healthy lifestyle:  Eat at least 3 REAL meals and 1-2 snacks per day.  Aim for no more than 5 hours between eating.  If you eat breakfast, please do so within one hour of getting up.   Each meal should contain half fruits/vegetables, one quarter protein, and one quarter carbs (no bigger than a computer mouse)  Cut down on sweet beverages. This includes juice, soda, and sweet tea.   Drink at least 1 glass of water with each meal and aim for at least 8 glasses per day  Exercise at least 150 minutes every week.

## 2022-03-31 NOTE — Assessment & Plan Note (Signed)
He is not having much benefit with Valtrex at this point.  We did discuss switching to alternative antiviral.  He is agreeable to this.  Will switch to acyclovir 400 mg 3 times daily as needed for flares.  We did discuss daily dosing for prevention however he will continue with as needed treatment for now.  He will let me know if not improving.  Would consider trial of famciclovir if not having any improvement with acyclovir.

## 2022-03-31 NOTE — Assessment & Plan Note (Signed)
Initially mildly elevated.  Slightly improved on recheck.  Very slightly above goal though typically is well-controlled.  Continue lisinopril 10 mg daily and metoprolol succinate 25 mg daily.  He will continue home monitoring.  He will let us know if not improving.

## 2022-03-31 NOTE — Progress Notes (Signed)
   Jonathan Holden is a 77 y.o. male who presents today for an office visit.  Assessment/Plan:  Chronic Problems Addressed Today: Cold sore He is not having much benefit with Valtrex at this point.  We did discuss switching to alternative antiviral.  He is agreeable to this.  Will switch to acyclovir 400 mg 3 times daily as needed for flares.  We did discuss daily dosing for prevention however he will continue with as needed treatment for now.  He will let me know if not improving.  Would consider trial of famciclovir if not having any improvement with acyclovir.  HTN (hypertension) Initially mildly elevated.  Slightly improved on recheck.  Very slightly above goal though typically is well-controlled.  Continue lisinopril 10 mg daily and metoprolol succinate 25 mg daily.  He will continue home monitoring.  He will let us know if not improving.  Lower extremity edema Likely secondary to venous insufficiency.  Is following with vascular surgery and has lower extremity ultrasound tomorrow.     Subjective:  HPI:  See A/P for status of chronic conditions.  Patient is here for follow-up.  HE has been having prodromal symptoms for his cold sore for the last week or so. He has been prescribed Valtrex to use as needed for cold sore outbreak.  Previously had been able to take 2000 mg upon outbreak which resolved outbreak however recently does not feel like this has been effective.  He has been taking 2000 mg twice daily but symptoms are not improving. He has not had any blister outbreak.        Objective:  Physical Exam: BP (!) 145/86   Pulse 73   Temp 97.7 F (36.5 C) (Temporal)   Ht 6\' 2"  (1.88 m)   Wt 219 lb 9.6 oz (99.6 kg)   SpO2 97%   BMI 28.19 kg/m   Gen: No acute distress, resting comfortably Neuro: Grossly normal, moves all extremities Psych: Normal affect and thought content      Jomayra Novitsky M. Jerline Pain, MD 03/31/2022 11:28 AM

## 2022-04-01 ENCOUNTER — Ambulatory Visit (INDEPENDENT_AMBULATORY_CARE_PROVIDER_SITE_OTHER)
Admission: RE | Admit: 2022-04-01 | Discharge: 2022-04-01 | Disposition: A | Discharge status: Home / Self Care | Payer: Medicare Other | Source: Ambulatory Visit | Attending: Vascular Surgery | Admitting: Vascular Surgery

## 2022-04-01 ENCOUNTER — Ambulatory Visit (INDEPENDENT_AMBULATORY_CARE_PROVIDER_SITE_OTHER): Payer: Medicare Other | Admitting: Vascular Surgery

## 2022-04-01 ENCOUNTER — Ambulatory Visit (HOSPITAL_COMMUNITY)
Admission: RE | Admit: 2022-04-01 | Discharge: 2022-04-01 | Disposition: A | Discharge status: Home / Self Care | Payer: Medicare Other | Source: Ambulatory Visit | Attending: Vascular Surgery | Admitting: Vascular Surgery

## 2022-04-01 ENCOUNTER — Encounter: Payer: Self-pay | Admitting: Vascular Surgery

## 2022-04-01 VITALS — BP 138/88 | HR 82 | Temp 98.5°F | Resp 20 | Ht 74.0 in | Wt 221.0 lb

## 2022-04-01 DIAGNOSIS — I82513 Chronic embolism and thrombosis of femoral vein, bilateral: Secondary | ICD-10-CM

## 2022-04-01 NOTE — Progress Notes (Signed)
Patient ID: Jonathan Holden, male   DOB: 1945-06-05, 77 y.o.   MRN: [id number]  Reason for Consult: Follow-up   Referred by Vivi Barrack, MD  Subjective:     HPI:  Jonathan Holden is a 77 y.o. male with no previous vascular history I recently saw for right greater than left lower extremity swelling.  At that time we reviewed a CT scan which demonstrated possible central occlusive venous disease he was also having significant swelling of the right calf and ankle and we suggested compression stockings, elevation for which she is now using a wedge and aspirin therapy as he cannot tolerate anticoagulation.  At this time he does have some easy bruising which is increased with the aspirin but his swelling is much improved with compression stockings and elevation.  He does have some thinning of the skin around the ankle had reflux studies and IVC iliac duplex today prior to evaluation.  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   Pseudophakia of left eye 05/22/2013   Pseudophakia of right eye 06/05/2013   S/P coronary angioplasty    Skin cancer    Hands, arms, nose   Streptococcal bacteremia    Family History  Problem Relation Age of Onset   Other Mother        AGE 85 HEALTHY   Heart attack Father 63       2 MI   Heart disease Maternal Grandfather    Heart disease Paternal Grandfather    Emphysema Other    Other Brother    Past Surgical History:  Procedure  Laterality Date   CARDIOVASCULAR STRESS TEST  08/22/2008   CATARACT EXTRACTION W/ INTRAOCULAR LENS IMPLANT Bilateral    COLONOSCOPY WITH PROPOFOL N/A 02/10/2022   Procedure: COLONOSCOPY WITH PROPOFOL;  Surgeon: Wilford Corner, MD;  Location: [facility];  Service: Gastroenterology;  Laterality: N/A;   CORONARY ANGIOPLASTY  1992   ERCP W/ SPHICTEROTOMY  12/15/2002   LAPAROSCOPIC CHOLECYSTECTOMY  12/16/2002   LUMBAR LAMINECTOMY     L5   POLYPECTOMY  02/10/2022   Procedure: POLYPECTOMY;  Surgeon: Wilford Corner, MD;  Location: [facility];  Service: Gastroenterology;;   REVERSE SHOULDER ARTHROPLASTY Right 01/09/2022   Procedure: REVERSE SHOULDER ARTHROPLASTY;  Surgeon: Nicholes Stairs, MD;  Location: [facility];  Service: Orthopedics;  Laterality: Right;  150   RIGHT SHOULDER SURGERY      SKIN CANCER EXCISION     TEE WITHOUT CARDIOVERSION N/A 02/28/2016   Procedure: TRANSESOPHAGEAL ECHOCARDIOGRAM (TEE);  Surgeon: Fay Records, MD;  Location: [facility];  Service: Cardiovascular;  Laterality: N/A;   TOTAL HIP ARTHROPLASTY Left 07/23/2021   Procedure: TOTAL HIP ARTHROPLASTY ANTERIOR APPROACH;  Surgeon: Gaynelle Arabian, MD;  Location: [facility];  Service: Orthopedics;  Laterality: Left;    Short Social History:  Social History   Tobacco Use   Smoking status: Former  Packs/day: 2.00    Years: 20.00    Total pack years: 40.00    Types: Cigarettes    Quit date: 03/27/1990    Years since quitting: 32.0   Smokeless tobacco: Never  Substance Use Topics   Alcohol use: Yes    Comment: Social    Allergies  Allergen Reactions   Eliquis [Apixaban] Other (See Comments)    Recurrent GI bleeding occurred from diverticulosis in the setting of anticoagulation therapy for atrial fibrillation.   Food Other (See Comments)    Pt is allergic to melons caused scratchy throat    Glutamic Acid Other (See Comments)    Pt is allergic to melons caused scratchy throat    Grass Pollen(K-O-[id number]-T-Swt  Vern)     Sneezing    Ciprofloxacin Hives   Pollen Extract Other (See Comments)    Sneezing     Current Outpatient Medications  Medication Sig Dispense Refill   acyclovir (ZOVIRAX) 400 MG tablet Take 1 tablet (400 mg total) by mouth 3 (three) times daily. Take at first sign of flare and continue until symptoms resolve or max of 10 days 90 tablet 3   Alirocumab (PRALUENT) 150 MG/ML SOAJ Inject 150 mg into the skin every 14 (fourteen) days.     amiodarone (PACERONE) 200 MG tablet TAKE 1 TABLET (200 MG TOTAL) BY MOUTH DAILY. \ 90 tablet 3   Carboxymethylcellul-Glycerin (LUBRICATING EYE DROPS OP) Place 1 drop into both eyes daily as needed (dry eyes).     cetirizine (ZYRTEC) 10 MG tablet Take 10 mg by mouth daily as needed for allergies.     clobetasol ointment (TEMOVATE) 0.05 % APPLY TO AFFECTED AREA TWICE A DAY 30 g 2   ketoconazole (NIZORAL) 2 % shampoo APPLY 1 3 TIMES A WEEK TO SCALP. LET SIT 10 MIN THEN RINSE OUT. Strength: 2 % 120 mL 3   lisinopril (ZESTRIL) 10 MG tablet TAKE 1 TABLET BY MOUTH DAILY. PLEASE KEEP UPCOMING APPOINTMENT WITH DR. Tamala Julian IN APRIL 2023 90 tablet 3   metoprolol succinate (TOPROL-XL) 25 MG 24 hr tablet TAKE 1 TABLET (25 MG TOTAL) BY MOUTH DAILY. PLEASE KEEP UPCOMING APPT IN APRIL 90 tablet 3   Evolocumab (REPATHA SURECLICK) [id number] MG/ML SOAJ Inject 140 mg into the skin every 14 (fourteen) days. (Patient not taking: Reported on 03/31/2022) 6 mL 3   No current facility-administered medications for this visit.    Review of Systems  Constitutional:  Constitutional negative. HENT: HENT negative.  Eyes: Eyes negative.  Cardiovascular: Positive for leg swelling.  GI: Gastrointestinal negative.  Skin: Positive for rash.  Neurological: Neurological negative. Hematologic: Positive for bruises/bleeds easily.  Psychiatric: Psychiatric negative.        Objective:  Objective   Vitals:   04/01/22 0926  BP: 138/88  Pulse: 82  Resp: 20  Temp: 98.5 F (36.9 C)  SpO2:  96%  Weight: 221 lb (100.2 kg)  Height: '6\' 2"'$  (1.88 m)   Body mass index is 28.37 kg/m.  Physical Exam HENT:     Head: Normocephalic.     Mouth/Throat:     Mouth: Mucous membranes are moist.  Eyes:     Pupils: Pupils are equal, round, and reactive to light.  Cardiovascular:     Rate and Rhythm: Normal rate.  Pulmonary:     Effort: Pulmonary effort is normal.  Abdominal:     General: Abdomen is flat.  Musculoskeletal:     Right lower leg: Edema present.     Left lower  leg: No edema.  Skin:    General: Skin is warm.     Capillary Refill: Capillary refill takes less than 2 seconds.     Comments: Very thin skin with multiple bruises throughout his bilateral upper and lower extremities  Neurological:     General: No focal deficit present.     Mental Status: He is alert.  Psychiatric:        Mood and Affect: Mood normal.        Judgment: Judgment normal.     Data: Venous Reflux Times  +--------------+---------+------+-----------+------------+----------------+   RIGHT        Reflux NoRefluxReflux TimeDiameter cmsComments                                  Yes                                            +--------------+---------+------+-----------+------------+----------------+   CFV          no                                                       +--------------+---------+------+-----------+------------+----------------+   FV prox                                             chronic  thrombus  +--------------+---------+------+-----------+------------+----------------+   FV mid                  yes   >1 second             chronic  thrombus  +--------------+---------+------+-----------+------------+----------------+   FV dist                                             chronic  thrombus  +--------------+---------+------+-----------+------------+----------------+   Popliteal              yes   >1 second              chronic  thrombus  +--------------+---------+------+-----------+------------+----------------+   GSV at SFJ              yes    >500 ms      0.58                       +--------------+---------+------+-----------+------------+----------------+   GSV prox thighno                            0.67                       +--------------+---------+------+-----------+------------+----------------+   GSV mid thigh no                            0.52                       +--------------+---------+------+-----------+------------+----------------+  GSV dist thighno                            0.58                       +--------------+---------+------+-----------+------------+----------------+   GSV at knee   no                            0.57                       +--------------+---------+------+-----------+------------+----------------+   GSV prox calf no                            0.49                       +--------------+---------+------+-----------+------------+----------------+   GSV mid calf  no                            0.47                       +--------------+---------+------+-----------+------------+----------------+   SSV Pop Fossa no                            0.55                       +--------------+---------+------+-----------+------------+----------------+   SSV prox calf no                            0.50                       +--------------+---------+------+-----------+------------+----------------+   SSV mid calf  no                            0.46                       +--------------+---------+------+-----------+------------+----------------+         Summary:  Right:  - No evidence of acute deep vein thrombosis seen in the right lower  extremity, from the common femoral through the popliteal veins.  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  - No evidence of superficial venous thrombosis in the right lower  extremity.    - The deep venous system is incompetent involving the femoral and  popliteal veins.  - The great saphenous vein is incompetent at the SFJ.  - The small saphenous vein is competent.    IVC/Iliac Findings:  +----------+------+--------+    IVC    PatentComments  +----------+------+--------+  IVC Prox  patent          +----------+------+--------+  IVC Mid   patent          +----------+------+--------+  IVC Distalpatent          +----------+------+--------+     +-------------------+---------+------------------------------------------+         CIV        RT-Patent  Comments                   +-------------------+---------+------------------------------------------+  Common Iliac Prox   patent                                              +-------------------+---------+------------------------------------------+  Common Iliac Mid            Unable to visualize secondary to bowel gas  +-------------------+---------+------------------------------------------+  Common Iliac Distal patent                                              +-------------------+---------+------------------------------------------+      +--------------------------+---------+--------+            EIV            RT-PatentComments  +--------------------------+---------+--------+  External Iliac Vein Prox   patent            +--------------------------+---------+--------+  External Iliac Vein Mid    patent            +--------------------------+---------+--------+  External Iliac Vein Distal patent            +--------------------------+---------+--------+        Summary:  IVC/Iliac: No evidence of occlusive thrombus in IVC and Iliac veins.  Visualization of mid common Iliac was limited.      Assessment/Plan:     77 year old male with right lower extremity swelling and skin changes which is really mild on today's evaluation.  He does have evidence of chronic femoral vein thrombus likely has some compression centrally but symptoms are very mild at this time.  No reflux identified and no central venous obstructive process identified on IVC iliac duplex.  As such I recommended continued compression stockings, elevation using his wedge particularly at night and aspirin therapy.  He will call me to follow-up on an as-needed basis.     Waynetta Sandy MD [facility]

## 2022-04-02 DIAGNOSIS — Z4789 Encounter for other orthopedic aftercare: Secondary | ICD-10-CM | POA: Diagnosis not present

## 2022-04-10 ENCOUNTER — Other Ambulatory Visit: Payer: Self-pay | Admitting: Family Medicine

## 2022-04-23 DIAGNOSIS — M1711 Unilateral primary osteoarthritis, right knee: Secondary | ICD-10-CM | POA: Diagnosis not present

## 2022-04-30 DIAGNOSIS — M1812 Unilateral primary osteoarthritis of first carpometacarpal joint, left hand: Secondary | ICD-10-CM | POA: Diagnosis not present

## 2022-04-30 DIAGNOSIS — M79642 Pain in left hand: Secondary | ICD-10-CM | POA: Diagnosis not present

## 2022-04-30 DIAGNOSIS — M1711 Unilateral primary osteoarthritis, right knee: Secondary | ICD-10-CM | POA: Diagnosis not present

## 2022-05-07 DIAGNOSIS — M1711 Unilateral primary osteoarthritis, right knee: Secondary | ICD-10-CM | POA: Diagnosis not present

## 2022-05-09 ENCOUNTER — Other Ambulatory Visit: Payer: Self-pay | Admitting: Family Medicine

## 2022-05-11 ENCOUNTER — Other Ambulatory Visit: Payer: Self-pay

## 2022-05-11 MED ORDER — METOPROLOL SUCCINATE ER 25 MG PO TB24: ORAL_TABLET | ORAL | 3 refills | Status: DC

## 2022-05-11 MED ORDER — LISINOPRIL 10 MG PO TABS: ORAL_TABLET | ORAL | 3 refills | Status: DC

## 2022-05-11 MED ORDER — AMIODARONE HCL 200 MG PO TABS: ORAL_TABLET | ORAL | 3 refills | Status: DC

## 2022-06-01 NOTE — Progress Notes (Deleted)
  Electrophysiology Office Follow up Visit Note:    Date:  06/01/2022   ID:  Jonathan Holden, DOB 1945-06-14, MRN [id number]  PCP:  Ardith Dark, MD  [facility] Cardiologist:  Kristeen Miss, MD  [facility] Electrophysiologist:  Will Jorja Loa, MD    Interval History:    Jonathan Holden is a 77 y.o. male who presents for a follow up visit.   Last seen March 24, 2022 for his atrial fibrillation.  We discussed left atrial appendage occlusion given his history of severe GI bleeding requiring transfusion.  He was being worked up for lower extremity DVT by Dr. Randie Heinz and vascular surgery.  Dr. Randie Heinz and I spoke after my appointment with Mr. Washabaugh.  Dr. Randie Heinz reported that the patient would not need chronic anticoagulation for his history of lower extremity DVTs.  He saw Dr. Randie Heinz in follow-up on April 01, 2022.  At that appointment it was noted that the patient has evidence of chronic femoral venous thrombosis.  There is some compression centrally but the patient has very mild symptoms.       Past medical, surgical, social and family history were reviewed.  ROS:   Please see the history of present illness.    All other systems reviewed and are negative.  EKGs/Labs/Other Studies Reviewed:    The following studies were reviewed today:  April 01, 2022 lower extremity venous Dopplers Chronic venous thrombosis of the right common femoral and femoral veins.  April 01, 2022 IVC ultrasound No evidence of occlusive thrombus in the IVC or iliac veins   Physical Exam:    VS:  There were no vitals taken for this visit.    Wt Readings from Last 3 Encounters:  04/01/22 221 lb (100.2 kg)  03/31/22 219 lb 9.6 oz (99.6 kg)  03/24/22 222 lb (100.7 kg)     GEN: *** Well nourished, well developed in no acute distress CARDIAC: ***RRR, no murmurs, rubs, gallops RESPIRATORY:  Clear to auscultation without rales, wheezing or rhonchi       ASSESSMENT:    1. PAF (paroxysmal atrial  fibrillation) (HCC)   2. 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 On amiodarone therapy   4. Chronic diastolic HF (heart failure) (HCC)   5. History of GI bleed    PLAN:    In order of problems listed above:  #Atrial fibrillation #History of GI bleeding On aspirin monotherapy. Given the patient's history of bilateral lower extremity venous thrombosis and evidence of mild central venous compression, I do not think he is an acceptable candidate for left atrial appendage occlusion given the need for large bore central venous access.   I discussed this at length with the patient during today's appointment.  #High risk med monitoring-amiodarone Last TSH, AST and ALT drawn in January 2024 and were okay.  Follow-up with EP APP in 6 months.  Blood work at that appointment.   Signed, Steffanie Dunn, MD, [facility], [facility] 06/01/2022 9:58 AM    Electrophysiology [facility]

## 2022-06-02 ENCOUNTER — Ambulatory Visit: Payer: Medicare Other | Admitting: Cardiology

## 2022-06-02 DIAGNOSIS — Z79899 Other long term (current) drug therapy: Secondary | ICD-10-CM

## 2022-06-02 DIAGNOSIS — I5032 Chronic diastolic (congestive) heart failure: Secondary | ICD-10-CM

## 2022-06-02 DIAGNOSIS — I82513 Chronic embolism and thrombosis of femoral vein, bilateral: Secondary | ICD-10-CM

## 2022-06-02 DIAGNOSIS — I480 Paroxysmal atrial fibrillation: Secondary | ICD-10-CM

## 2022-06-02 DIAGNOSIS — Z8719 Personal history of other diseases of the digestive system: Secondary | ICD-10-CM

## 2022-06-04 DIAGNOSIS — H3322 Serous retinal detachment, left eye: Secondary | ICD-10-CM | POA: Diagnosis not present

## 2022-06-04 DIAGNOSIS — H02825 Cysts of left lower eyelid: Secondary | ICD-10-CM | POA: Diagnosis not present

## 2022-06-04 DIAGNOSIS — H35373 Puckering of macula, bilateral: Secondary | ICD-10-CM | POA: Diagnosis not present

## 2022-06-04 DIAGNOSIS — Z961 Presence of intraocular lens: Secondary | ICD-10-CM | POA: Diagnosis not present

## 2022-06-17 NOTE — Progress Notes (Deleted)
  Electrophysiology Office Follow up Visit Note:    Date:  06/17/2022   ID:  SHAMAN MALEY, DOB Jan 14, 1946, MRN [id number]  PCP:  Ardith Dark, MD  [facility] Cardiologist:  Kristeen Miss, MD  [facility] Electrophysiologist:  Will Jorja Loa, MD    Interval History:    Jonathan Holden is a 77 y.o. male who presents for a follow up visit.   I last saw the patient March 24, 2022 for paroxysmal atrial fibrillation and possible watchman implant.  We discussed watchman during the last appointment but he was also being worked up for possible IV C filter with Dr. Randie Heinz given history of DVT/PE.  He had lower extremity Dopplers in March which showed chronic thrombus in the femoral and common femoral veins on both the right and left sides.       Past medical, surgical, social and family history were reviewed.  ROS:   Please see the history of present illness.    All other systems reviewed and are negative.  EKGs/Labs/Other Studies Reviewed:    The following studies were reviewed today:    Physical Exam:    VS:  There were no vitals taken for this visit.    Wt Readings from Last 3 Encounters:  04/01/22 221 lb (100.2 kg)  03/31/22 219 lb 9.6 oz (99.6 kg)  03/24/22 222 lb (100.7 kg)     GEN: *** Well nourished, well developed in no acute distress CARDIAC: ***RRR, no murmurs, rubs, gallops RESPIRATORY:  Clear to auscultation without rales, wheezing or rhonchi       ASSESSMENT:    No diagnosis found. PLAN:    In order of problems listed above:  Not a good candidate due to chronic LE thrombus          Total time spent with patient today *** minutes. This includes reviewing records, evaluating the patient and coordinating care.    Signed, Steffanie Dunn, MD, [facility], [facility] 06/17/2022 10:41 PM    Electrophysiology [facility]

## 2022-06-18 ENCOUNTER — Encounter: Payer: Self-pay | Admitting: Cardiology

## 2022-06-18 ENCOUNTER — Ambulatory Visit: Payer: Medicare Other | Attending: Cardiology | Admitting: Cardiology

## 2022-06-25 ENCOUNTER — Ambulatory Visit: Payer: Medicare Other | Admitting: Physician Assistant

## 2022-07-29 NOTE — Progress Notes (Signed)
Electrophysiology Office Follow up Visit Note:    Date:  07/30/2022   ID:  Jonathan Holden, DOB Nov 06, 1945, MRN [id number]  PCP:  Ardith Dark, MD  [facility] Cardiologist:  Kristeen Miss, MD  [facility] Electrophysiologist:  Will Jorja Loa, MD    Interval History:    Jonathan Holden is a 77 y.o. male who presents for a follow up visit.   Last seen March 24, 2022 for his history of atrial fibrillation and GI bleeding.  He he is not on anticoagulation given history of his recurrent GI bleeding.  He has a history of chronic lower extremity DVT and follows with Dr. Randie Heinz and vascular surgery.  Today, he states he is feeling okay. He endorses very easy bruising, with skin tears that occur easily such as when using a blood pressure cuff. We revisited discussion of the watchman procedure.  He denies any palpitations, chest pain, shortness of breath, peripheral edema, lightheadedness, headaches, syncope, orthopnea, or PND.     Past medical, surgical, social and family history were reviewed.  ROS:  Please see the history of present illness.  All other systems reviewed and are negative. (+) Easy bruising (+) Significant bruises/skin tears of upper extremities  EKGs/Labs/Other Studies Reviewed:    The following studies were reviewed today:  March lower extremity Dopplers Evidence of chronic thrombus involving the common femoral and femoral veins  December 2023 lower extremity Dopplers Bilateral lower extremity DVT  EKG Interpretation Date/Time:  Thursday July 30 2022 08:05:20 EDT Ventricular Rate:  71 PR Interval:  208 QRS Duration:  102 QT Interval:  398 QTC Calculation: 432 R Axis:   21  Text Interpretation: Normal sinus rhythm Normal ECG Confirmed by Steffanie Dunn [phone]) on 07/30/2022 8:07:08 AM    Physical Exam:    VS:  BP 130/82   Pulse 71   Ht 6\' 2"  (1.88 m)   Wt 221 lb 3.2 oz (100.3 kg)   SpO2 97%   BMI 28.40 kg/m     Wt Readings from Last 3  Encounters:  07/30/22 221 lb 3.2 oz (100.3 kg)  04/01/22 221 lb (100.2 kg)  03/31/22 219 lb 9.6 oz (99.6 kg)     GEN:  Well nourished, well developed in no acute distress CARDIAC: RRR, no murmurs, rubs, gallops RESPIRATORY:  Clear to auscultation without rales, wheezing or rhonchi  SKIN: Ecchymoses of bilateral upper extremities with minor abrasions.     ASSESSMENT:    1. PAF (paroxysmal atrial fibrillation) (HCC)   2. 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 Encounter for long-term (current) use of high-risk medication    PLAN:    In order of problems listed above:  #Paroxysmal atrial fibrillation #GI bleeding Not on anticoagulation given history of GI bleeding.  The decision to implant left atrial appendage occlusion device is complicated by the fact he has bilateral lower extremity DVTs that are chronic.    For now, I have recommended he continue on aspirin therapy.    HAS-BLED score 3 Hypertension Yes  Abnormal renal and liver function (Dialysis, transplant, Cr >2.26 mg/dL /Cirrhosis or Bilirubin >2x Normal or AST/ALT/AP >3x Normal) No  Stroke No  Bleeding Yes  Labile INR (Unstable/high INR) No  Elderly (>65) Yes  Drugs or alcohol (? 8 drinks/week, anti-plt or NSAID) No    CHA2DS2-VASc Score = 4  The patient's score is based upon: CHF History: 0 HTN History: 1 Diabetes History: 0  Stroke History: 0 Vascular Disease History: 1 Age Score: 2 Gender Score: 0   #High risk drug monitoring-amiodarone Recheck CMP, TSH and free T4 today.  Follow-up in 6 months with APP.  Repeat blood work at that time.  I,Mathew Stumpf,acting as a Neurosurgeon for Lanier Prude, MD.,have documented all relevant documentation on the behalf of Lanier Prude, MD,as directed by  Lanier Prude, MD while in the presence of Lanier Prude, MD.  I, Lanier Prude, MD, have reviewed all documentation for this visit. The documentation  on 07/30/22 for the exam, diagnosis, procedures, and orders are all accurate and complete.   Signed, Steffanie Dunn, MD, [facility], [facility] 07/30/2022 8:15 AM    Electrophysiology [facility]

## 2022-07-29 NOTE — Progress Notes (Unsigned)
  Electrophysiology Office Follow up Visit Note:    Date:  07/30/2022   ID:  Jonathan Holden, DOB [birth year], MRN [id number]  PCP:  Ardith Dark, MD  [facility] Cardiologist:  Kristeen Miss, MD  [facility] Electrophysiologist:  Will Jorja Loa, MD    Interval History:    Jonathan Holden is a 77 y.o. male who presents for a follow up visit.   Last seen March 24, 2022 for his history of atrial fibrillation and GI bleeding.  He he is not on anticoagulation given history of his recurrent GI bleeding.  He has a history of chronic lower extremity DVT and follows with Dr. Randie Heinz and vascular surgery.       Past medical, surgical, social and family history were reviewed.  ROS:   Please see the history of present illness.    All other systems reviewed and are negative.  EKGs/Labs/Other Studies Reviewed:    The following studies were reviewed today:  March lower extremity Dopplers Evidence of chronic thrombus involving the common femoral and femoral veins  December 2023 lower extremity Dopplers Bilateral lower extremity DVT       Physical Exam:    VS:  There were no vitals taken for this visit.    Wt Readings from Last 3 Encounters:  04/01/22 221 lb (100.2 kg)  03/31/22 219 lb 9.6 oz (99.6 kg)  03/24/22 222 lb (100.7 kg)     GEN: *** Well nourished, well developed in no acute distress CARDIAC: ***RRR, no murmurs, rubs, gallops RESPIRATORY:  Clear to auscultation without rales, wheezing or rhonchi       ASSESSMENT:    1. PAF (paroxysmal atrial fibrillation) (HCC)   2. 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 Encounter for long-term (current) use of high-risk medication    PLAN:    In order of problems listed above:  #Paroxysmal atrial fibrillation #GI bleeding Not on anticoagulation given history of GI bleeding.  The decision to implant left atrial appendage occlusion device is complicated by the fact  he has bilateral lower extremity DVTs that are chronic.    For now, I have recommended he continue on aspirin therapy.    HAS-BLED score 3 Hypertension Yes  Abnormal renal and liver function (Dialysis, transplant, Cr >2.26 mg/dL /Cirrhosis or Bilirubin >2x Normal or AST/ALT/AP >3x Normal) No  Stroke No  Bleeding Yes  Labile INR (Unstable/high INR) No  Elderly (>65) Yes  Drugs or alcohol (? 8 drinks/week, anti-plt or NSAID) No    CHA2DS2-VASc Score = 4  The patient's score is based upon: CHF History: 0 HTN History: 1 Diabetes History: 0 Stroke History: 0 Vascular Disease History: 1 Age Score: 2 Gender Score: 0   #High risk drug monitoring-amiodarone Recheck CMP, TSH and free T4 today.  Follow-up in 6 months with APP.  Repeat blood work at that time.  Signed, Steffanie Dunn, MD, [facility], [facility] 07/30/2022 8:00 AM    Electrophysiology [facility]

## 2022-07-30 ENCOUNTER — Ambulatory Visit: Payer: Medicare Other | Attending: Cardiology | Admitting: Cardiology

## 2022-07-30 ENCOUNTER — Encounter: Payer: Self-pay | Admitting: Cardiology

## 2022-07-30 VITALS — BP 130/82 | HR 71 | Ht 74.0 in | Wt 221.2 lb

## 2022-07-30 DIAGNOSIS — Z79899 Other long term (current) drug therapy: Secondary | ICD-10-CM | POA: Diagnosis not present

## 2022-07-30 DIAGNOSIS — I480 Paroxysmal atrial fibrillation: Secondary | ICD-10-CM | POA: Diagnosis not present

## 2022-07-30 DIAGNOSIS — I82513 Chronic embolism and thrombosis of femoral vein, bilateral: Secondary | ICD-10-CM | POA: Diagnosis not present

## 2022-07-30 LAB — COMPREHENSIVE METABOLIC PANEL
ALT: 17 IU/L (ref 0–44)
AST: 23 IU/L (ref 0–40)
Albumin: 4.5 g/dL (ref 3.8–4.8)
Alkaline Phosphatase: 73 IU/L (ref 44–121)
BUN/Creatinine Ratio: 17 (ref 10–24)
BUN: 18 mg/dL (ref 8–27)
Bilirubin Total: 0.4 mg/dL (ref 0.0–1.2)
CO2: 21 mmol/L (ref 20–29)
Calcium: 9.9 mg/dL (ref 8.6–10.2)
Chloride: 101 mmol/L (ref 96–106)
Creatinine, Ser: 1.03 mg/dL (ref 0.76–1.27)
Globulin, Total: 2.2 g/dL (ref 1.5–4.5)
Glucose: 91 mg/dL (ref 70–99)
Potassium: 5.1 mmol/L (ref 3.5–5.2)
Sodium: 138 mmol/L (ref 134–144)
Total Protein: 6.7 g/dL (ref 6.0–8.5)
eGFR: 75 mL/min/{1.73_m2} (ref >59)

## 2022-07-30 LAB — TSH: TSH: 2.34 u[IU]/mL (ref 0.450–4.500)

## 2022-07-30 LAB — T4, FREE: Free T4: 1.15 ng/dL (ref 0.82–1.77)

## 2022-07-30 NOTE — Patient Instructions (Signed)
Medication Instructions:  Your physician recommends that you continue on your current medications as directed. Please refer to the Current Medication list given to you today. *If you need a refill on your cardiac medications before your next appointment, please call your pharmacy*   Lab Work: CMET, TSH, T4 free TODAY If you have labs (blood work) drawn today and your tests are completely normal, you will receive your results only by: MyChart Message (if you have MyChart) OR A paper copy in the mail If you have any lab test that is abnormal or we need to change your treatment, we will call you to review the results.   Follow-Up: At [facility], you and your health needs are our priority.  As part of our continuing mission to provide you with exceptional heart care, we have created designated Provider Care Teams.  These Care Teams include your primary Cardiologist (physician) and Advanced Practice Providers (APPs -  Physician Assistants and Nurse Practitioners) who all work together to provide you with the care you need, when you need it.  We recommend signing up for the patient portal called "MyChart".  Sign up information is provided on this After Visit Summary.  MyChart is used to connect with patients for Virtual Visits (Telemedicine).  Patients are able to view lab/test results, encounter notes, upcoming appointments, etc.  Non-urgent messages can be sent to your provider as well.   To learn more about what you can do with MyChart, go to [email or web address].    Your next appointment:   6 month(s)  Provider:   You will see one of the following Advanced Practice Providers on your designated Care Team:   Francis Dowse, [place] [address] [place], [place] Canary Brim, NP

## 2022-08-09 ENCOUNTER — Encounter: Payer: Self-pay | Admitting: Cardiovascular Disease

## 2022-08-09 NOTE — Progress Notes (Signed)
Cardiology Office Note:    Date:  08/09/2022   ID:  Jonathan Holden, DOB Feb 10, 1945, MRN [id number]  PCP:  Ardith Dark, MD   [facility] Providers Cardiologist:  Verdis Prime, now Georgeanna Radziewicz   Electrophysiologist:  Will Jorja Loa, MD {    Referring MD: Ardith Dark, MD   Chief Complaint  Patient presents with   Hypertension        Atrial Fibrillation    History of Present Illness:   Jan. 19, 2024    Jonathan Holden is a 77 y.o. male with a hx of Afib, CAD , HTN, HLD He presented with leg edema in Dec.  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 Edilia Bo who thought the clots were old and recommended compression hose.    The patient has a history of significant GI bleed from diverticulosis while being on anticoagulation for his atrial fibrillation.  Dr. Edilia Bo spoke to him briefly  He may need an IVC filter .   His last episode of Afib was 5 years ago   Has not taken his meds today  - BP is a bit higher today  On Amio for his PAF Will get PFTs and TSH today  Encouraged him to go see his eye doctor yearly   Had an episode of lightheadedness EMS was called.  Was thought to have Afib. Received something IV ( ? Diltiazem )  Resolved   We discussed possible Watchman  CHADS2VASC is  44  ( age 3, CAD,  HTN )  I would like to have him see one of our electrophysiologist to discuss watchman.  In addition, I would like to potentially lower his dose of amiodarone.  I would appreciate the electrophysiology opinion regarding that as well.  August 10, 2022 Jonathan Holden is seen for follow up of his atrial fib Hx of DVT Has a hx of GI bleed and has not been on [facility]      Past Medical History:  Diagnosis Date   Acute lower GI bleeding 03/19/2016   Arthritis    Atrial fibrillation with RVR (HCC) 02/21/2016   Coronary artery disease    Diverticulosis 04/18/2016   Dysrhythmia    AFIB   Endocarditis of native valve    Former smoker    History of MI  (myocardial infarction) 1992   Hyperlipidemia    Hypertension    Infection of intervertebral disc (HCC)    Insomnia 04/18/2016   Left retinal detachment 03/18/2015   Loose body of right shoulder    Myocardial infarction (HCC)    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   Pseudophakia of left eye 05/22/2013   Pseudophakia of right eye 06/05/2013   S/P coronary angioplasty    Skin cancer    Hands, arms, nose   Streptococcal bacteremia     Past Surgical History:  Procedure Laterality Date   CARDIOVASCULAR STRESS TEST  08/22/2008   CATARACT EXTRACTION W/ INTRAOCULAR LENS IMPLANT Bilateral    COLONOSCOPY WITH PROPOFOL N/A 02/10/2022   Procedure: COLONOSCOPY WITH PROPOFOL;  Surgeon: Charlott Rakes, MD;  [facility]: [facility];  Service: Gastroenterology;  Laterality: N/A;   CORONARY ANGIOPLASTY  1992   ERCP W/ SPHICTEROTOMY  12/15/2002   LAPAROSCOPIC CHOLECYSTECTOMY  12/16/2002   LUMBAR LAMINECTOMY     L5   POLYPECTOMY  02/10/2022   Procedure: POLYPECTOMY;  Surgeon: Charlott Rakes, MD;  Location: [facility];  Service: Gastroenterology;;   REVERSE SHOULDER ARTHROPLASTY Right 01/09/2022  Procedure: REVERSE SHOULDER ARTHROPLASTY;  Surgeon: Yolonda Kida, MD;  Location: [facility];  Service: Orthopedics;  Laterality: Right;  150   RIGHT SHOULDER SURGERY      SKIN CANCER EXCISION     TEE WITHOUT CARDIOVERSION N/A 02/28/2016   Procedure: TRANSESOPHAGEAL ECHOCARDIOGRAM (TEE);  Surgeon: Pricilla Riffle, MD;  Location: [facility] ENDOSCOPY;  Service: Cardiovascular;  Laterality: N/A;   TOTAL HIP ARTHROPLASTY Left 07/23/2021   Procedure: TOTAL HIP ARTHROPLASTY ANTERIOR APPROACH;  Surgeon: Ollen Gross, MD;  Location: [facility];  Service: Orthopedics;  Laterality: Left;    Current Medications: No outpatient medications have been marked as taking for the 08/10/22 encounter (Office Visit) with Timya Trimmer, Deloris Ping, MD.     Allergies:   Eliquis [apixaban], Food, Glutamic acid, Grass  pollen(k-o-r-t-swt vern), Ciprofloxacin, and Pollen extract   Social History   Socioeconomic History   Marital status: Divorced    Spouse name: Not on file   Number of children: Not on file   Years of education: Not on file   Highest education level: Not on file  Occupational History   Occupation: sales  Tobacco Use   Smoking status: Former    Current packs/day: 0.00    Average packs/day: 2.0 packs/day for 20.0 years (40.0 ttl pk-yrs)    Types: Cigarettes    Start date: 03/27/1970    Quit date: 03/27/1990    Years since quitting: 32.3   Smokeless tobacco: Never  Vaping Use   Vaping status: Never Used  Substance and Sexual Activity   Alcohol use: Yes    Comment: Social   Drug use: No   Sexual activity: Not Currently  Other Topics Concern   Not on file  Social History Narrative   Not on file   Social Determinants of Health   Financial Resource Strain: Low Risk  (10/09/2021)   Overall Financial Resource Strain (CARDIA)    Difficulty of Paying Living Expenses: Not hard at all  Food Insecurity: No Food Insecurity (10/09/2021)   Hunger Vital Sign    Worried About Running Out of Food in the Last Year: Never true    Ran Out of Food in the Last Year: Never true  Transportation Needs: No Transportation Needs (10/09/2021)   PRAPARE - Administrator, Civil Service (Medical): No    Lack of Transportation (Non-Medical): No  Physical Activity: Insufficiently Active (10/09/2021)   Exercise Vital Sign    Days of Exercise per Week: 2 days    Minutes of Exercise per Session: 40 min  Stress: No Stress Concern Present (10/09/2021)   [employer] of Occupational Health - Occupational Stress Questionnaire    Feeling of Stress : Not at all  Social Connections: Socially Isolated (10/09/2021)   Social Connection and Isolation Panel [NHANES]    Frequency of Communication with Friends and Family: More than three times a week    Frequency of Social Gatherings with Friends and Family:  More than three times a week    Attends Religious Services: Never    Database administrator or Organizations: No    Attends Engineer, structural: Never    Marital Status: Divorced     Family History: The patient's family history includes Emphysema in an other family member; Heart attack (age of onset: 35) in his father; Heart disease in his maternal grandfather and paternal grandfather; Other in his brother and mother.  ROS:   Please see the history of present illness.     All other  systems reviewed and are negative.  EKGs/Labs/Other Studies Reviewed:    The following studies were reviewed today:   EKG:     Recent Labs: 12/25/2021: Hemoglobin 14.3; Platelets 166 07/30/2022: ALT 17; BUN 18; Creatinine, Ser 1.03; Potassium 5.1; Sodium 138; TSH 2.340  Recent Lipid Panel    Component Value Date/Time   CHOL 162 02/06/2022 1159   TRIG 157 (H) 02/06/2022 1159   HDL 45 02/06/2022 1159   CHOLHDL 3.6 02/06/2022 1159   CHOLHDL 4 07/25/2020 1016   VLDL 45.8 (H) 07/25/2020 1016   LDLCALC 90 02/06/2022 1159   LDLDIRECT 80.0 07/25/2020 1016     Risk Assessment/Calculations:      No BP recorded.  {Refresh Note OR Click here to enter BP  :1}***         Physical Exam:    Physical Exam: There were no vitals taken for this visit.  No BP recorded.  {Refresh Note OR Click here to enter BP  :1}***    GEN:  Well nourished, well developed in no acute distress HEENT: Normal NECK: No JVD; No carotid bruits LYMPHATICS: No lymphadenopathy CARDIAC: RRR ***, no murmurs, rubs, gallops RESPIRATORY:  Clear to auscultation without rales, wheezing or rhonchi  ABDOMEN: Soft, non-tender, non-distended MUSCULOSKELETAL:  No edema; No deformity  SKIN: Warm and dry NEUROLOGIC:  Alert and oriented x 3    ASSESSMENT:    No diagnosis found.  PLAN:        PAF :   I would like to have him see one of our electrophysiologist to discuss watchman.  In addition, I would like to  potentially lower his dose of amiodarone.  I would appreciate the electrophysiology opinion regarding that as well. /CHADS2VASC is 75 ( age, CAD, HTN)     2.  DVT: He is had venous duplex scan.  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  He did not want to be started on DOAC ( previous bleed on anticoagulation )   so he is been given compression hose.  I have discussed the case with Dr. Edilia Bo and agree with the decision for compression hose Consider IVC filter or another trial of OAC if he has recurrent DVT.          Medication Adjustments/Labs and Tests Ordered: Current medicines are reviewed at length with the patient today.  Concerns regarding medicines are outlined above.  No orders of the defined types were placed in this encounter.  No orders of the defined types were placed in this encounter.    There are no Patient Instructions on file for this visit.   Signed, Kristeen Miss, MD  08/09/2022 8:12 AM     [facility]

## 2022-08-10 ENCOUNTER — Ambulatory Visit: Payer: Medicare Other | Attending: Cardiovascular Disease | Admitting: Cardiovascular Disease

## 2022-08-10 ENCOUNTER — Encounter: Payer: Self-pay | Admitting: Cardiovascular Disease

## 2022-08-12 DIAGNOSIS — M1711 Unilateral primary osteoarthritis, right knee: Secondary | ICD-10-CM | POA: Diagnosis not present

## 2022-09-03 ENCOUNTER — Encounter (INDEPENDENT_AMBULATORY_CARE_PROVIDER_SITE_OTHER): Payer: Self-pay

## 2022-09-19 ENCOUNTER — Emergency Department (HOSPITAL_COMMUNITY): Payer: Medicare Other

## 2022-09-19 ENCOUNTER — Other Ambulatory Visit: Payer: Self-pay

## 2022-09-19 ENCOUNTER — Encounter (HOSPITAL_COMMUNITY): Payer: Self-pay

## 2022-09-19 ENCOUNTER — Inpatient Hospital Stay (HOSPITAL_COMMUNITY)
Admission: EM | Admit: 2022-09-19 | Discharge: 2022-09-23 | DRG: 281 | Disposition: A | Discharge status: Home / Self Care | Payer: Medicare Other | Attending: Cardiology | Admitting: Cardiology

## 2022-09-19 DIAGNOSIS — Z96642 Presence of left artificial hip joint: Secondary | ICD-10-CM | POA: Diagnosis present

## 2022-09-19 DIAGNOSIS — Z85828 Personal history of other malignant neoplasm of skin: Secondary | ICD-10-CM

## 2022-09-19 DIAGNOSIS — Z9861 Coronary angioplasty status: Secondary | ICD-10-CM | POA: Diagnosis not present

## 2022-09-19 DIAGNOSIS — Z888 Allergy status to other drugs, medicaments and biological substances status: Secondary | ICD-10-CM

## 2022-09-19 DIAGNOSIS — Z825 Family history of asthma and other chronic lower respiratory diseases: Secondary | ICD-10-CM | POA: Diagnosis not present

## 2022-09-19 DIAGNOSIS — R0902 Hypoxemia: Secondary | ICD-10-CM | POA: Diagnosis not present

## 2022-09-19 DIAGNOSIS — Z881 Allergy status to other antibiotic agents status: Secondary | ICD-10-CM

## 2022-09-19 DIAGNOSIS — I252 Old myocardial infarction: Secondary | ICD-10-CM

## 2022-09-19 DIAGNOSIS — I480 Paroxysmal atrial fibrillation: Secondary | ICD-10-CM | POA: Diagnosis not present

## 2022-09-19 DIAGNOSIS — K219 Gastro-esophageal reflux disease without esophagitis: Secondary | ICD-10-CM | POA: Diagnosis present

## 2022-09-19 DIAGNOSIS — J432 Centrilobular emphysema: Secondary | ICD-10-CM | POA: Diagnosis not present

## 2022-09-19 DIAGNOSIS — I10 Essential (primary) hypertension: Secondary | ICD-10-CM | POA: Diagnosis present

## 2022-09-19 DIAGNOSIS — R079 Chest pain, unspecified: Secondary | ICD-10-CM | POA: Diagnosis not present

## 2022-09-19 DIAGNOSIS — N179 Acute kidney failure, unspecified: Secondary | ICD-10-CM | POA: Diagnosis present

## 2022-09-19 DIAGNOSIS — R918 Other nonspecific abnormal finding of lung field: Secondary | ICD-10-CM | POA: Diagnosis not present

## 2022-09-19 DIAGNOSIS — R0789 Other chest pain: Secondary | ICD-10-CM | POA: Diagnosis not present

## 2022-09-19 DIAGNOSIS — Z79899 Other long term (current) drug therapy: Secondary | ICD-10-CM

## 2022-09-19 DIAGNOSIS — I2510 Atherosclerotic heart disease of native coronary artery without angina pectoris: Secondary | ICD-10-CM | POA: Insufficient documentation

## 2022-09-19 DIAGNOSIS — E785 Hyperlipidemia, unspecified: Secondary | ICD-10-CM | POA: Diagnosis present

## 2022-09-19 DIAGNOSIS — Z96611 Presence of right artificial shoulder joint: Secondary | ICD-10-CM | POA: Diagnosis present

## 2022-09-19 DIAGNOSIS — Z87891 Personal history of nicotine dependence: Secondary | ICD-10-CM

## 2022-09-19 DIAGNOSIS — Z8249 Family history of ischemic heart disease and other diseases of the circulatory system: Secondary | ICD-10-CM | POA: Diagnosis not present

## 2022-09-19 DIAGNOSIS — Z91048 Other nonmedicinal substance allergy status: Secondary | ICD-10-CM

## 2022-09-19 DIAGNOSIS — Z91018 Allergy to other foods: Secondary | ICD-10-CM | POA: Diagnosis not present

## 2022-09-19 DIAGNOSIS — I160 Hypertensive urgency: Secondary | ICD-10-CM | POA: Diagnosis not present

## 2022-09-19 DIAGNOSIS — I214 Non-ST elevation (NSTEMI) myocardial infarction: Principal | ICD-10-CM | POA: Diagnosis present

## 2022-09-19 LAB — BASIC METABOLIC PANEL
Anion gap: 9 (ref 5–15)
BUN: 12 mg/dL (ref 8–23)
CO2: 24 mmol/L (ref 22–32)
Calcium: 9.4 mg/dL (ref 8.9–10.3)
Chloride: 102 mmol/L (ref 98–111)
Creatinine, Ser: 1.01 mg/dL (ref 0.61–1.24)
GFR, Estimated: >60 mL/min (ref >60)
Glucose, Bld: 103 mg/dL — ABNORMAL HIGH (ref 70–99)
Potassium: 4.1 mmol/L (ref 3.5–5.1)
Sodium: 135 mmol/L (ref 135–145)

## 2022-09-19 LAB — COMPREHENSIVE METABOLIC PANEL
ALT: 17 U/L (ref 0–44)
AST: 23 U/L (ref 15–41)
Albumin: 3.6 g/dL (ref 3.5–5.0)
Alkaline Phosphatase: 61 U/L (ref 38–126)
Anion gap: 10 (ref 5–15)
BUN: 11 mg/dL (ref 8–23)
CO2: 24 mmol/L (ref 22–32)
Calcium: 9.2 mg/dL (ref 8.9–10.3)
Chloride: 101 mmol/L (ref 98–111)
Creatinine, Ser: 1 mg/dL (ref 0.61–1.24)
GFR, Estimated: >60 mL/min (ref >60)
Glucose, Bld: 94 mg/dL (ref 70–99)
Potassium: 3.7 mmol/L (ref 3.5–5.1)
Sodium: 135 mmol/L (ref 135–145)
Total Bilirubin: 1 mg/dL (ref 0.3–1.2)
Total Protein: 6.8 g/dL (ref 6.5–8.1)

## 2022-09-19 LAB — I-STAT CHEM 8, ED
BUN: 13 mg/dL (ref 8–23)
Calcium, Ion: 1.33 mmol/L (ref 1.15–1.40)
Chloride: 103 mmol/L (ref 98–111)
Creatinine, Ser: 1 mg/dL (ref 0.61–1.24)
Glucose, Bld: 101 mg/dL — ABNORMAL HIGH (ref 70–99)
HCT: 44 % (ref 39.0–52.0)
Hemoglobin: 15 g/dL (ref 13.0–17.0)
Potassium: 4.2 mmol/L (ref 3.5–5.1)
Sodium: 138 mmol/L (ref 135–145)
TCO2: 25 mmol/L (ref 22–32)

## 2022-09-19 LAB — CBC
HCT: 42.3 % (ref 39.0–52.0)
Hemoglobin: 14.2 g/dL (ref 13.0–17.0)
MCH: 33.5 pg (ref 26.0–34.0)
MCHC: 33.6 g/dL (ref 30.0–36.0)
MCV: 99.8 fL (ref 80.0–100.0)
Platelets: 178 10*3/uL (ref 150–400)
RBC: 4.24 MIL/uL (ref 4.22–5.81)
RDW: 13.4 % (ref 11.5–15.5)
WBC: 5.6 10*3/uL (ref 4.0–10.5)
nRBC: 0 % (ref 0.0–0.2)

## 2022-09-19 LAB — BRAIN NATRIURETIC PEPTIDE: B Natriuretic Peptide: 130.8 pg/mL — ABNORMAL HIGH (ref 0.0–100.0)

## 2022-09-19 LAB — TROPONIN I (HIGH SENSITIVITY)
Troponin I (High Sensitivity): 205 ng/L (ref <18)
Troponin I (High Sensitivity): 255 ng/L (ref <18)

## 2022-09-19 LAB — HEPARIN LEVEL (UNFRACTIONATED): Heparin Unfractionated: 0.23 [IU]/mL — ABNORMAL LOW (ref 0.30–0.70)

## 2022-09-19 MED ORDER — HEPARIN BOLUS VIA INFUSION
1000.0000 [IU] | Freq: Once | INTRAVENOUS | Status: AC
  Administered 2022-09-19: 1000 [IU] via INTRAVENOUS
  Filled 2022-09-19: qty 1000

## 2022-09-19 MED ORDER — AMIODARONE HCL 200 MG PO TABS
200.0000 mg | ORAL_TABLET | Freq: Every day | ORAL | Status: DC
  Administered 2022-09-20 – 2022-09-23 (×4): 200 mg via ORAL
  Filled 2022-09-19 (×4): qty 1

## 2022-09-19 MED ORDER — NITROGLYCERIN 0.4 MG SL SUBL: 0.4000 mg | SUBLINGUAL_TABLET | SUBLINGUAL | Status: DC | PRN

## 2022-09-19 MED ORDER — HEPARIN (PORCINE) 25000 UT/250ML-% IV SOLN
1400.0000 [IU]/h | INTRAVENOUS | Status: DC
  Administered 2022-09-19: 1200 [IU]/h via INTRAVENOUS
  Administered 2022-09-20: 1400 [IU]/h via INTRAVENOUS
  Filled 2022-09-19 (×2): qty 250

## 2022-09-19 MED ORDER — METOPROLOL SUCCINATE ER 25 MG PO TB24: 25.0000 mg | ORAL_TABLET | Freq: Every day | ORAL | Status: DC

## 2022-09-19 MED ORDER — PANTOPRAZOLE SODIUM 40 MG PO TBEC
40.0000 mg | DELAYED_RELEASE_TABLET | Freq: Every day | ORAL | Status: DC
  Administered 2022-09-19 – 2022-09-23 (×5): 40 mg via ORAL
  Filled 2022-09-19 (×5): qty 1

## 2022-09-19 MED ORDER — HYDRALAZINE HCL 20 MG/ML IJ SOLN
10.0000 mg | INTRAMUSCULAR | Status: DC | PRN
  Administered 2022-09-19: 10 mg via INTRAVENOUS
  Filled 2022-09-19: qty 1

## 2022-09-19 MED ORDER — IOHEXOL 350 MG/ML SOLN
75.0000 mL | Freq: Once | INTRAVENOUS | Status: AC | PRN
  Administered 2022-09-19: 75 mL via INTRAVENOUS

## 2022-09-19 MED ORDER — ASPIRIN 81 MG PO TBEC
81.0000 mg | DELAYED_RELEASE_TABLET | Freq: Every day | ORAL | Status: DC
  Administered 2022-09-20 – 2022-09-23 (×3): 81 mg via ORAL
  Filled 2022-09-19 (×3): qty 1

## 2022-09-19 MED ORDER — LISINOPRIL 10 MG PO TABS: 10.0000 mg | ORAL_TABLET | Freq: Every day | ORAL | Status: DC

## 2022-09-19 MED ORDER — HEPARIN BOLUS VIA INFUSION
4000.0000 [IU] | Freq: Once | INTRAVENOUS | Status: AC
  Administered 2022-09-19: 4000 [IU] via INTRAVENOUS
  Filled 2022-09-19: qty 4000

## 2022-09-19 MED ORDER — ONDANSETRON HCL 4 MG/2ML IJ SOLN: 4.0000 mg | Freq: Four times a day (QID) | INTRAMUSCULAR | Status: DC | PRN

## 2022-09-19 MED ORDER — LORATADINE 10 MG PO TABS: 10.0000 mg | ORAL_TABLET | Freq: Every day | ORAL | Status: DC

## 2022-09-19 MED ORDER — ACETAMINOPHEN 325 MG PO TABS
650.0000 mg | ORAL_TABLET | ORAL | Status: DC | PRN
  Administered 2022-09-19: 650 mg via ORAL
  Filled 2022-09-19: qty 2

## 2022-09-19 NOTE — Plan of Care (Signed)
Patient blood pressure has elevated since his admission. Currently has been given hydralazine IV to interact and will continue to monitor patient

## 2022-09-19 NOTE — Progress Notes (Signed)
Keys, wallet, watch

## 2022-09-19 NOTE — ED Notes (Signed)
ED TO INPATIENT HANDOFF REPORT  ED Nurse Name and Phone #: [phone]  S Name/Age/Gender Jonathan Holden 77 y.o. male Room/Bed: 002C/002C  Code Status   Code Status: Full Code  Home/SNF/Other Home Patient oriented to: self, place, time, and situation Is this baseline? Yes   Triage Complete: Triage complete  Chief Complaint NSTEMI (non-ST elevated myocardial infarction) [facility]) [I21.4]  Triage Note Pt reports heartburn starting last night . Pt reports waking up this morning with the heartburn. Pt has a history of an mi about 30 years ago. Pt reports relief of symptoms with nitrotabs.  18 in r fa  Pt received 2 nitros and 324 of asprin.  Ems vitals  Initial 200 systolic  Most recent vitals  132/440 Hr 70  Spo2 94 ra     Allergies Allergies  Allergen Reactions   Eliquis [Apixaban] Other (See Comments)    Recurrent GI bleeding occurred from diverticulosis in the setting of anticoagulation therapy for atrial fibrillation.   Food Other (See Comments)    Pt is allergic to melons caused scratchy throat    Glutamic Acid Other (See Comments)    Pt is allergic to melons caused scratchy throat    Grass Pollen(K-O-R-T-Swt Vern)     Sneezing    Ciprofloxacin Hives   Pollen Extract Other (See Comments)    Sneezing     Level of Care/Admitting Diagnosis ED Disposition     ED Disposition  Admit   Condition  --   Comment  Hospital Area: [facility] [[id number]]  Level of Care: Telemetry Cardiac [103]  May admit patient to [facility] or [facility] if equivalent level of care is available:: No  Covid Evaluation: Asymptomatic - no recent exposure (last 10 days) testing not required  Diagnosis: NSTEMI (non-ST elevated myocardial infarction) [facility]) [102725]  Admitting Physician: Regan Lemming [phone]  Attending Physician: Regan Lemming [phone]  Certification:: I certify this patient will need inpatient services for at least 2 midnights   Expected Medical Readiness: 09/23/2022          B Medical/Surgery History Past Medical History:  Diagnosis Date   Acute lower GI bleeding 03/19/2016   Arthritis    Atrial fibrillation with RVR (HCC) 02/21/2016   Coronary artery disease    Diverticulosis 04/18/2016   Dysrhythmia    AFIB   Endocarditis of native valve    Former smoker    History of MI (myocardial infarction) 1992   Hyperlipidemia    Hypertension    Infection of intervertebral disc (HCC)    Insomnia 04/18/2016   Left retinal detachment 03/18/2015   Loose body of right shoulder    Myocardial infarction [facility])    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   Pseudophakia of left eye 05/22/2013   Pseudophakia of right eye 06/05/2013   S/P coronary angioplasty    Skin cancer    Hands, arms, nose   Streptococcal bacteremia    Past Surgical History:  Procedure Laterality Date   CARDIOVASCULAR STRESS TEST  08/22/2008   CATARACT EXTRACTION W/ INTRAOCULAR LENS IMPLANT Bilateral    COLONOSCOPY WITH PROPOFOL N/A 02/10/2022   Procedure: COLONOSCOPY WITH PROPOFOL;  Surgeon: Charlott Rakes, MD;  [facility];  Service: Gastroenterology;  Laterality: N/A;   CORONARY ANGIOPLASTY  1992   ERCP W/ SPHICTEROTOMY  12/15/2002   LAPAROSCOPIC CHOLECYSTECTOMY  12/16/2002   LUMBAR LAMINECTOMY     L5   POLYPECTOMY  02/10/2022   Procedure: POLYPECTOMY;  Surgeon: Charlott Rakes,  MD;  Location: [facility];  Service: Gastroenterology;;   REVERSE SHOULDER ARTHROPLASTY Right 01/09/2022   Procedure: REVERSE SHOULDER ARTHROPLASTY;  Surgeon: Yolonda Kida, MD;  Location[facility];  Service: Orthopedics;  Laterality: Right;  150   RIGHT SHOULDER SURGERY      SKIN CANCER EXCISION     TEE WITHOUT CARDIOVERSION N/A 02/28/2016   Procedure: TRANSESOPHAGEAL ECHOCARDIOGRAM (TEE);  Surgeon: Pricilla Riffle, MD;  Location: [facility] ENDOSCOPY;  Service: Cardiovascular;  Laterality: N/A;   TOTAL HIP ARTHROPLASTY Left  07/23/2021   Procedure: TOTAL HIP ARTHROPLASTY ANTERIOR APPROACH;  Surgeon: Ollen Gross, MD;  Location[facility];  Service: Orthopedics;  Laterality: Left;     A IV Location/Drains/Wounds Patient Lines/Drains/Airways Status     Active Line/Drains/Airways     Name Placement date Placement time Site Days   Peripheral IV 09/19/22 18 G Anterior;Right Forearm 09/19/22  0800  Forearm  less than 1            Intake/Output Last 24 hours No intake or output data in the 24 hours ending 09/19/22 1400  Labs/Imaging Results for orders placed or performed during the hospital encounter of 09/19/22 (from the past 48 hour(s))  CBC     Status: None   Collection Time: 09/19/22  8:19 AM  Result Value Ref Range   WBC 5.6 4.0 - 10.5 K/uL   RBC 4.24 4.22 - 5.81 MIL/uL   Hemoglobin 14.2 13.0 - 17.0 g/dL   HCT 16.1 09.6 - 04.5 %   MCV 99.8 80.0 - 100.0 fL   MCH 33.5 26.0 - 34.0 pg   MCHC 33.6 30.0 - 36.0 g/dL   RDW 40.9 81.1 - 91.4 %   Platelets 178 150 - 400 K/uL   nRBC 0.0 0.0 - 0.2 %    Comment: Performed at [facility], [address], [place], [place] [address]  Troponin I (High Sensitivity)     Status: Abnormal   Collection Time: 09/19/22  8:19 AM  Result Value Ref Range   Troponin I (High Sensitivity) 205 (HH) <18 ng/L    Comment: Attempted to call 0930, no answer CRITICAL RESULT CALLED TO, READ BACK BY AND VERIFIED WITH K MOOD RN 09/19/2022 0933 BNUNNERY (NOTE) Elevated high sensitivity troponin I (hsTnI) values and significant  changes across serial measurements may suggest ACS but many other  chronic and acute conditions are known to elevate hsTnI results.  Refer to the "Links" section for chest pain algorithms and additional  guidance. Performed at [facility], [address], [place], [place] [address]   Brain natriuretic peptide     Status: Abnormal   Collection Time: 09/19/22  8:19 AM  Result Value Ref Range   B Natriuretic Peptide 130.8 (H) 0.0 - 100.0  pg/mL    Comment: Performed at [facility], [address], [place], [place] [address]  Troponin I (High Sensitivity)     Status: Abnormal   Collection Time: 09/19/22 10:04 AM  Result Value Ref Range   Troponin I (High Sensitivity) 255 (HH) <18 ng/L    Comment: CRITICAL VALUE NOTED. VALUE IS CONSISTENT WITH PREVIOUSLY REPORTED/CALLED VALUE (NOTE) Elevated high sensitivity troponin I (hsTnI) values and significant  changes across serial measurements may suggest ACS but many other  chronic and acute conditions are known to elevate hsTnI results.  Refer to the "Links" section for chest pain algorithms and additional  guidance. Performed at [facility], [address], [place], [place]  [address]   Basic metabolic panel     Status: Abnormal   Collection Time: 09/19/22 11:42 AM  Result Value Ref Range   Sodium 135 135 - 145 mmol/L   Potassium 4.1 3.5 - 5.1 mmol/L   Chloride 102 98 - 111 mmol/L   CO2 24 22 - 32 mmol/L   Glucose, Bld 103 (H) 70 - 99 mg/dL    Comment: Glucose reference range applies only to samples taken after fasting for at least 8 hours.   BUN 12 8 - 23 mg/dL   Creatinine, Ser 2.77 0.61 - 1.24 mg/dL   Calcium 9.4 8.9 - 82.4 mg/dL   GFR, Estimated >23 >53 mL/min    Comment: (NOTE) Calculated using the CKD-EPI Creatinine Equation (2021)    Anion gap 9 5 - 15    Comment: Performed at [facility], [address] [place], [place] [address]  I-stat chem 8, ED (not at [facility], [facility] or [facility])     Status: Abnormal   Collection Time: 09/19/22 11:48 AM  Result Value Ref Range   Sodium 138 135 - 145 mmol/L   Potassium 4.2 3.5 - 5.1 mmol/L   Chloride 103 98 - 111 mmol/L   BUN 13 8 - 23 mg/dL   Creatinine, Ser 1.54 0.61 - 1.24 mg/dL   Glucose, Bld 008 (H) 70 - 99 mg/dL    Comment: Glucose reference range applies only to samples taken after fasting for at least 8 hours.   Calcium, Ion 1.33 1.15 - 1.40 mmol/L   TCO2 25 22 - 32 mmol/L   Hemoglobin 15.0 13.0 -  17.0 g/dL   HCT 67.6 19.5 - 09.3 %   CT Angio Chest PE W and/or Wo Contrast  Result Date: 09/19/2022 CLINICAL DATA:  Pulmonary embolism suspected. High probability. New onset chest pain overnight. No known DVT. EXAM: CT ANGIOGRAPHY CHEST WITH CONTRAST TECHNIQUE: Multidetector CT imaging of the chest was performed using the standard protocol during bolus administration of intravenous contrast. Multiplanar CT image reconstructions and MIPs were obtained to evaluate the vascular anatomy. RADIATION DOSE REDUCTION: This exam was performed according to the departmental dose-optimization program which includes automated exposure control, adjustment of the mA and/or kV according to patient size and/or use of iterative reconstruction technique. CONTRAST:  75mL OMNIPAQUE IOHEXOL 350 MG/ML SOLN COMPARISON:  None Available. FINDINGS: Cardiovascular: Satisfactory opacification of the pulmonary arteries to the segmental level. No evidence of pulmonary embolism. Normal heart size. No pericardial effusion. Aortic atherosclerosis. Coronary artery calcifications. Mediastinum/Nodes: Thyroid gland, trachea, and esophagus are normal. No mediastinal or hilar adenopathy. Lungs/Pleura: No pleural effusion or consolidative change. Paraseptal and centrilobular emphysema with diffuse bronchial wall thickening. Mild, heterogeneous ground-glass attenuation is identified bilaterally with areas of air trapping. No pneumothorax identified. Perifissural nodule along the minor fissure of the right lung likely represents a benign intrapulmonary lymph node measuring 4 mm. Calcified granulomas noted within the right upper lobe. Upper Abdomen: No acute abnormality. Musculoskeletal: No chest wall abnormality. No acute or significant osseous findings. Review of the MIP images confirms the above findings. IMPRESSION: 1. No evidence for acute pulmonary embolus. 2. Diffuse bronchial wall thickening with emphysema, as above; imaging findings suggestive  of underlying COPD. 3. Mild, heterogeneous ground-glass attenuation is identified bilaterally with areas of air trapping. Findings are nonspecific but may be seen in the setting of small airways disease. 4. Coronary artery calcifications. 5. Aortic Atherosclerosis (ICD10-I70.0) and Emphysema (ICD10-J43.9). Electronically Signed   By: Veronda Prude.D.  On: 09/19/2022 13:39   DG Chest 2 View  Result Date: 09/19/2022 CLINICAL DATA:  Chest pain EXAM: CHEST - 2 VIEW COMPARISON:  06/16/2018 FINDINGS: Heart size and mediastinal contours are unremarkable. There is no pleural effusion. Scar versus platelike atelectasis is identified within the left base. No airspace consolidation. No acute osseous findings identified. IMPRESSION: 1. No acute cardiopulmonary disease. 2. Left basilar scar versus platelike atelectasis. Electronically Signed   By: Signa Kell M.D.   On: 09/19/2022 09:25    Pending Labs Unresulted Labs (From admission, onward)     Start     Ordered   09/19/22 1134  Comprehensive metabolic panel  Once,   STAT        09/19/22 1133   Signed and Held  Lipoprotein A (LPA)  Tomorrow morning,   R        Signed and Held   Signed and Held  Basic metabolic panel  Tomorrow morning,   R        Signed and Held   Signed and Held  Lipid panel  Tomorrow morning,   R        Signed and Held   Signed and Held  CBC  Tomorrow morning,   R        Signed and Held            Vitals/Pain Today's Vitals   09/19/22 1100 09/19/22 1115 09/19/22 1130 09/19/22 1352  BP: (!) 178/100 (!) 175/97 (!) 179/104   Pulse: 67 68 75   Resp: 14 15 13    Temp:    97.7 F (36.5 C)  TempSrc:    Oral  SpO2: 97% 96% 100%   Weight:      Height:      PainSc:        Isolation Precautions No active isolations  Medications Medications  iohexol (OMNIPAQUE) 350 MG/ML injection 75 mL (75 mLs Intravenous Contrast Given 09/19/22 1303)    Mobility walks     Focused Assessments    R Recommendations: See  Admitting Provider Note  Report given to:   Additional Notes:

## 2022-09-19 NOTE — ED Provider Notes (Signed)
[place] EMERGENCY DEPARTMENT AT [facility] Provider Note   CSN: [id number] Arrival date & time: 09/19/22  0756     History  Chief Complaint  Patient presents with   Chest Pain    Jonathan Holden is a 77 y.o. male.  The history is provided by the patient and medical records. No language interpreter was used.  Chest Pain Pain location:  Substernal area Pain quality: aching, crushing and pressure   Pain radiates to:  Does not radiate Pain severity:  Moderate Onset quality:  Sudden Timing:  Constant Progression:  Resolved Chronicity:  Recurrent Relieved by:  Nothing Worsened by:  Nothing Ineffective treatments:  None tried Associated symptoms: no abdominal pain, no back pain, no cough, no diaphoresis, no fatigue, no fever, no headache, no lower extremity edema, no nausea, no near-syncope, no numbness, no palpitations, no shortness of breath and no vomiting   Risk factors: coronary artery disease        Home Medications Prior to Admission medications   Medication Sig Start Date End Date Taking? Authorizing Provider  acyclovir (ZOVIRAX) 400 MG tablet TAKE 1 TABLET BY MOUTH 3 TIMES DAILY.TAKE AT FIRST FLARE/CONTINUE UNTIL SYMPTOMS GONE/ MAX 10 DAYS 05/11/22   Ardith Dark, MD  Alirocumab (PRALUENT) 150 MG/ML SOAJ Inject 150 mg into the skin every 14 (fourteen) days.    [provider]  amiodarone (PACERONE) 200 MG tablet TAKE 1 TABLET (200 MG TOTAL) BY MOUTH DAILY. \ 05/11/22   Nahser, Deloris Ping, MD  Carboxymethylcellul-Glycerin (LUBRICATING EYE DROPS OP) Place 1 drop into both eyes daily as needed (dry eyes).    [provider]  cetirizine (ZYRTEC) 10 MG tablet Take 10 mg by mouth daily as needed for allergies.    [provider]  clobetasol ointment (TEMOVATE) 0.05 % APPLY TO AFFECTED AREA TWICE A DAY 10/24/21   Ardith Dark, MD  Evolocumab (REPATHA SURECLICK) 140 MG/ML SOAJ Inject 140 mg into the skin every 14 (fourteen) days.  01/23/22   Lyn Records, MD  ketoconazole (NIZORAL) 2 % shampoo APPLY 1 TO 3 TIMES A WEEK TO SCALP. LET SIT 10 MIN THEN RINSE OUT. STRENGTH: 2 % 04/10/22   Ardith Dark, MD  lisinopril (ZESTRIL) 10 MG tablet TAKE 1 TABLET BY MOUTH DAILY. PLEASE KEEP UPCOMING APPOINTMENT WITH DR. Katrinka Blazing IN APRIL 2023 05/11/22   Nahser, Deloris Ping, MD  metoprolol succinate (TOPROL-XL) 25 MG 24 hr tablet TAKE 1 TABLET (25 MG TOTAL) BY MOUTH DAILY. PLEASE KEEP UPCOMING APPT IN APRIL 05/11/22   Nahser, Deloris Ping, MD      Allergies    Eliquis [apixaban], Food, Glutamic acid, Grass pollen(k-o-r-t-swt vern), Ciprofloxacin, and Pollen extract    Review of Systems   Review of Systems  Constitutional:  Negative for diaphoresis, fatigue and fever.  HENT:  Negative for congestion.   Eyes:  Negative for visual disturbance.  Respiratory:  Negative for cough, chest tightness, shortness of breath and wheezing.   Cardiovascular:  Positive for chest pain. Negative for palpitations and near-syncope.  Gastrointestinal:  Negative for abdominal pain, constipation, diarrhea, nausea and vomiting.  Genitourinary:  Negative for flank pain.  Musculoskeletal:  Negative for back pain and neck pain.  Neurological:  Negative for light-headedness, numbness and headaches.  Psychiatric/Behavioral:  Negative for agitation.   All other systems reviewed and are negative.   Physical Exam Updated Vital Signs BP (!) 180/98   Pulse 70   Resp 12   Ht 6\' 2"  (1.88  m)   Wt 98.4 kg   SpO2 97%   BMI 27.86 kg/m  Physical Exam Vitals and nursing note reviewed.  Constitutional:      General: He is not in acute distress.    Appearance: He is well-developed. He is not ill-appearing, toxic-appearing or diaphoretic.  HENT:     Head: Normocephalic and atraumatic.  Eyes:     Conjunctiva/sclera: Conjunctivae normal.     Pupils: Pupils are equal, round, and reactive to light.  Cardiovascular:     Rate and Rhythm: Normal rate and regular rhythm.      Heart sounds: Normal heart sounds. No murmur heard. Pulmonary:     Effort: Pulmonary effort is normal. No respiratory distress.     Breath sounds: Normal breath sounds. No wheezing, rhonchi or rales.  Chest:     Chest wall: No tenderness.  Abdominal:     Palpations: Abdomen is soft.     Tenderness: There is no abdominal tenderness.  Musculoskeletal:        General: No swelling.     Cervical back: Neck supple.     Right lower leg: No tenderness. No edema.     Left lower leg: No tenderness. No edema.  Skin:    General: Skin is warm and dry.     Capillary Refill: Capillary refill takes less than 2 seconds.  Neurological:     General: No focal deficit present.     Mental Status: He is alert.  Psychiatric:        Mood and Affect: Mood normal. Mood is not anxious.     ED Results / Procedures / Treatments   Labs (all labs ordered are listed, but only abnormal results are displayed) Labs Reviewed  BRAIN NATRIURETIC PEPTIDE - Abnormal; Notable for the following components:      Result Value   B Natriuretic Peptide 130.8 (*)    All other components within normal limits  BASIC METABOLIC PANEL - Abnormal; Notable for the following components:   Glucose, Bld 103 (*)    All other components within normal limits  I-STAT CHEM 8, ED - Abnormal; Notable for the following components:   Glucose, Bld 101 (*)    All other components within normal limits  TROPONIN I (HIGH SENSITIVITY) - Abnormal; Notable for the following components:   Troponin I (High Sensitivity) 205 (*)    All other components within normal limits  TROPONIN I (HIGH SENSITIVITY) - Abnormal; Notable for the following components:   Troponin I (High Sensitivity) 255 (*)    All other components within normal limits  CBC  COMPREHENSIVE METABOLIC PANEL  HEPARIN LEVEL (UNFRACTIONATED)    EKG EKG Interpretation Date/Time:  Saturday September 19 2022 08:02:53 EDT Ventricular Rate:  73 PR Interval:  193 QRS Duration:  112 QT  Interval:  419 QTC Calculation: 462 R Axis:   50  Text Interpretation: Sinus rhythm Borderline intraventricular conduction delay Borderline T wave abnormalities when compared to prior, overall similar appearance. No STEMI Confirmed by Theda Belfast ([id number]) on 09/19/2022 8:12:50 AM  Radiology CT Angio Chest PE W and/or Wo Contrast  Result Date: 09/19/2022 CLINICAL DATA:  Pulmonary embolism suspected. High probability. New onset chest pain overnight. No known DVT. EXAM: CT ANGIOGRAPHY CHEST WITH CONTRAST TECHNIQUE: Multidetector CT imaging of the chest was performed using the standard protocol during bolus administration of intravenous contrast. Multiplanar CT image reconstructions and MIPs were obtained to evaluate the vascular anatomy. RADIATION DOSE REDUCTION: This exam was performed according to the  departmental dose-optimization program which includes automated exposure control, adjustment of the mA and/or kV according to patient size and/or use of iterative reconstruction technique. CONTRAST:  75mL OMNIPAQUE IOHEXOL 350 MG/ML SOLN COMPARISON:  None Available. FINDINGS: Cardiovascular: Satisfactory opacification of the pulmonary arteries to the segmental level. No evidence of pulmonary embolism. Normal heart size. No pericardial effusion. Aortic atherosclerosis. Coronary artery calcifications. Mediastinum/Nodes: Thyroid gland, trachea, and esophagus are normal. No mediastinal or hilar adenopathy. Lungs/Pleura: No pleural effusion or consolidative change. Paraseptal and centrilobular emphysema with diffuse bronchial wall thickening. Mild, heterogeneous ground-glass attenuation is identified bilaterally with areas of air trapping. No pneumothorax identified. Perifissural nodule along the minor fissure of the right lung likely represents a benign intrapulmonary lymph node measuring 4 mm. Calcified granulomas noted within the right upper lobe. Upper Abdomen: No acute abnormality. Musculoskeletal: No chest  wall abnormality. No acute or significant osseous findings. Review of the MIP images confirms the above findings. IMPRESSION: 1. No evidence for acute pulmonary embolus. 2. Diffuse bronchial wall thickening with emphysema, as above; imaging findings suggestive of underlying COPD. 3. Mild, heterogeneous ground-glass attenuation is identified bilaterally with areas of air trapping. Findings are nonspecific but may be seen in the setting of small airways disease. 4. Coronary artery calcifications. 5. Aortic Atherosclerosis (ICD10-I70.0) and Emphysema (ICD10-J43.9). Electronically Signed   By: Signa Kell M.D.   On: 09/19/2022 13:39   DG Chest 2 View  Result Date: 09/19/2022 CLINICAL DATA:  Chest pain EXAM: CHEST - 2 VIEW COMPARISON:  06/16/2018 FINDINGS: Heart size and mediastinal contours are unremarkable. There is no pleural effusion. Scar versus platelike atelectasis is identified within the left base. No airspace consolidation. No acute osseous findings identified. IMPRESSION: 1. No acute cardiopulmonary disease. 2. Left basilar scar versus platelike atelectasis. Electronically Signed   By: Signa Kell M.D.   On: 09/19/2022 09:25    Procedures Procedures    Medications Ordered in ED Medications  amiodarone (PACERONE) tablet 200 mg (200 mg Oral Not Given 09/19/22 1526)  lisinopril (ZESTRIL) tablet 10 mg (10 mg Oral Not Given 09/19/22 1525)  metoprolol succinate (TOPROL-XL) 24 hr tablet 25 mg (25 mg Oral Not Given 09/19/22 1526)  aspirin EC tablet 81 mg (has no administration in time range)  nitroGLYCERIN (NITROSTAT) SL tablet 0.4 mg (has no administration in time range)  acetaminophen (TYLENOL) tablet 650 mg (has no administration in time range)  ondansetron (ZOFRAN) injection 4 mg (has no administration in time range)  heparin ADULT infusion 100 units/mL (25000 units/226mL) (1,200 Units/hr Intravenous New Bag/Given 09/19/22 1523)  hydrALAZINE (APRESOLINE) injection 10 mg (has no administration  in time range)  iohexol (OMNIPAQUE) 350 MG/ML injection 75 mL (75 mLs Intravenous Contrast Given 09/19/22 1303)  heparin bolus via infusion 4,000 Units (4,000 Units Intravenous Bolus from Bag 09/19/22 1523)    ED Course/ Medical Decision Making/ A&P                                 Medical Decision Making Amount and/or Complexity of Data Reviewed Labs: ordered. Radiology: ordered.  Risk Prescription drug management. Decision regarding hospitalization.    Jonathan Holden is a 77 y.o. 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  According to patient, he has been doing very well and has had  no other symptoms until last night about midnight when he woke up having a pressure-like chest discomfort in his central chest.  He reports it did not radiate and it was not associated with nausea, vomiting, diaphoresis, or shortness of breath.  He tried taking some Tums and that did not seem to help.  He went back to bed but then woke up again with the chest discomfort and call for help.  He was given nitro with EMS and that has resolved his discomfort.  He reports it was a 4 out of 10 in severity but felt similar to previous MI.  He reports he takes his medicine as directed and denies any new leg pain or leg swelling.  He has a known DVT and cannot take blood thinners due to what appears to be previous rectal bleeding.  He denies any recent fevers, chills, congestion, cough, constipation, diarrhea, or urinary changes.   On exam, lungs clear.  Chest nontender.  I do not appreciate a murmur.  Abdomen nontender.  Good pulses in all extremities.  Legs nontender nonedematous.  He has lots of areas of bruising that he reports is chronic.  No focal neurologic deficits initially.  He does have a small red bump on his left upper arm  where he had a tick bite last week that he pulled off.  No other rashes reported.  EKG does not show STEMI.  Clinically I do feel need to rule out both a cardiac and a thromboembolic etiology of his symptoms so we will get chest x-ray and labs and troponin but will also get a CT PE study as he has a known DVT and had new chest discomfort overnight.  After workup has progressed, anticipate discussion with cardiology given his similarity to previous MI in regards to the discomfort.  Next       He remains chest pain-free.  Troponin returned over 200.  Cardiology was called who will come see patient.  Will still wait for the other workup to be completed but anticipate admission by cardiology for suspected NSTEMI.    Second troponin is rising.  Still waiting on his Metabolic panel to get his CT scan.  Awaiting cardiology evaluation after imaging.          Final Clinical Impression(s) / ED Diagnoses Final diagnoses:  NSTEMI (non-ST elevated myocardial infarction) (HCC)    Clinical Impression: 1. NSTEMI (non-ST elevated myocardial infarction) [facility])     Disposition: Admit  This note was prepared with assistance of Dragon voice recognition software. Occasional wrong-word or sound-a-like substitutions may have occurred due to the inherent limitations of voice recognition software.      Lonney Revak, Canary Brim, MD 09/19/22 [phone]

## 2022-09-19 NOTE — Progress Notes (Signed)
Patient notified family.

## 2022-09-19 NOTE — ED Triage Notes (Signed)
Pt reports heartburn starting last night . Pt reports waking up this morning with the heartburn. Pt has a history of an mi about 30 years ago. Pt reports relief of symptoms with nitrotabs.  18 in r fa  Pt received 2 nitros and 324 of asprin.  Ems vitals  Initial 200 systolic  Most recent vitals  161/096 Hr 70  Spo2 94 ra

## 2022-09-19 NOTE — Progress Notes (Signed)
ANTICOAGULATION CONSULT NOTE   Pharmacy Consult for Heparin Indication: chest pain/ACS  Allergies  Allergen Reactions   Eliquis [Apixaban] Other (See Comments)    Recurrent GI bleeding occurred from diverticulosis in the setting of anticoagulation therapy for atrial fibrillation.   Food Other (See Comments)    Pt is allergic to melons caused scratchy throat    Glutamic Acid Other (See Comments)    Pt is allergic to melons caused scratchy throat    Grass Pollen(K-O-R-T-Swt Vern)     Sneezing    Ciprofloxacin Hives   Pollen Extract Other (See Comments)    Sneezing     Patient Measurements: Height: 6\' 2"  (188 cm) Weight: 98.4 kg (217 lb) IBW/kg (Calculated) : 82.2 Heparin Dosing Weight: 98.4 kg  Vital Signs: Temp: 98.4 F (36.9 C) (08/31 1930) Temp Source: Oral (08/31 1930) BP: 142/75 (08/31 1930) Pulse Rate: 76 (08/31 1930)  Labs: Recent Labs    09/19/22 0819 09/19/22 1004 09/19/22 1142 09/19/22 1148 09/19/22 1622 09/19/22 2210  HGB 14.2  --   --  15.0  --   --   HCT 42.3  --   --  44.0  --   --   PLT 178  --   --   --   --   --   HEPARINUNFRC  --   --   --   --   --  0.23*  CREATININE  --   --  1.01 1.00 1.00  --   TROPONINIHS 205* 255*  --   --   --   --     Estimated Creatinine Clearance: 70.8 mL/min (by C-G formula based on SCr of 1 mg/dL).   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 \ 90 tablet 3 09/19/2022   Carboxymethylcellul-Glycerin (LUBRICATING EYE DROPS OP) Place 1 drop into both eyes daily as needed (dry eyes).   09/19/2022   clobetasol ointment (TEMOVATE) 0.05 % APPLY TO AFFECTED AREA TWICE A DAY 30 g 2 09/19/2022   ketoconazole (NIZORAL) 2 % shampoo APPLY 1 TO 3 TIMES A WEEK TO SCALP. LET SIT 10 MIN THEN RINSE OUT. STRENGTH: 2 % (Patient taking differently: Apply 1 Application topically 3 (three) times a week. APPLY 1 TO 3 TIMES A WEEK TO SCALP. LET SIT 10 MIN THEN RINSE OUT. STRENGTH: 2 %) 120 mL 3 Past Week   lisinopril (ZESTRIL) 10 MG tablet TAKE 1 TABLET BY MOUTH DAILY. PLEASE KEEP UPCOMING APPOINTMENT WITH DR. Katrinka Blazing IN APRIL 2023 90  tablet 3 09/19/2022   metoprolol succinate (TOPROL-XL) 25 MG 24 hr tablet TAKE 1 TABLET (25 MG TOTAL) BY MOUTH DAILY. PLEASE KEEP UPCOMING APPT IN APRIL 90 tablet 3 09/19/2022   valACYclovir (VALTREX) 1000 MG tablet Take 2 tablets by mouth daily. If having a flare up, pt will take an additional tablet.   09/14/2022   Evolocumab (REPATHA SURECLICK) 140 MG/ML SOAJ Inject 140 mg into the skin every 14 (fourteen) days. 6 mL 3 09/16/2022   Scheduled:   [START ON 09/20/2022] amiodarone  200 mg Oral Daily   [START ON 09/20/2022] aspirin EC  81 mg Oral Daily   lisinopril  10 mg Oral Daily   metoprolol succinate  25 mg Oral Daily   pantoprazole  40 mg Oral Daily   Infusions:   heparin 1,200 Units/hr (09/19/22 1523)   PRN: acetaminophen, hydrALAZINE, nitroGLYCERIN, ondansetron (ZOFRAN) IV  Assessment: 77 yom with a history of AF, CAD. Patient is presenting with chest pain. Heparin per pharmacy consult placed for chest  pain/ACS.  Patient is not on anticoagulation prior to arrival secondary to history of GIB.  -heparin level= 0.23 on 1200 units/ht  Goal of Therapy:  Heparin level 0.3-0.7 units/ml Monitor platelets by anticoagulation protocol: Yes   Plan:  -Give IV heparin 1000 units bolus x 1, then increase  heparin infusion to 1400 units/hr -Heparin level in 6 hours and daily wth CBC daily   Harland German, PharmD Clinical Pharmacist **Pharmacist phone directory can now be found on [email or web address] ([email or web address]).  Listed under [facility].

## 2022-09-19 NOTE — Progress Notes (Signed)
Medical cards in pocket

## 2022-09-19 NOTE — Progress Notes (Signed)
Transportation voucher is needed upon discharged

## 2022-09-19 NOTE — H&P (Signed)
Cardiology H&P   Patient ID: Jonathan Holden MRN: [id number]; DOB: 04-10-1945  Admit date: 09/19/2022 Date of Consult: 09/19/2022  PCP:  Ardith Dark, MD   [facility] Providers Cardiologist:  Kristeen Miss, MD  Electrophysiologist:  Regan Lemming, MD       Patient Profile:   Jonathan Holden is a 77 y.o. male with a hx of atrial fibrillation, coronary artery disease who is being seen 09/19/2022 for the evaluation of non-STEMI at the request of Kerney Elbe.  History of Present Illness:   [name] presented to the hospital this morning after chest pain which was stuttering throughout the night last night.  Pain began yesterday evening.  He awoke from a nap at 7 PM with chest pain.  He took his nighttime medications as well as some Tums with some potential improvement in his discomfort.  Pain again recurred this morning.  He presented to the emergency room.  In the emergency room, troponins were found to be elevated.  Pain continues to be stuttering.  The patient does have a history of coronary artery disease with angioplasty of the RCA per the patient 30 years ago.  The pain in the center of his chest is similar to his prior discomfort, though 30 years ago he does recall pain radiating to his arms.  Troponin in the emergency room elevated at 205 to 255.   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   Pseudophakia of left eye 05/22/2013    Pseudophakia of right eye 06/05/2013   S/P coronary angioplasty    Skin cancer    Hands, arms, nose   Streptococcal bacteremia     Past Surgical History:  Procedure Laterality Date   CARDIOVASCULAR STRESS TEST  08/22/2008   CATARACT EXTRACTION W/ INTRAOCULAR LENS IMPLANT Bilateral    COLONOSCOPY WITH PROPOFOL N/A 02/10/2022   Procedure: COLONOSCOPY WITH PROPOFOL;  Surgeon: Charlott Rakes, MD;  Location: [facility] ENDOSCOPY;  Service: Gastroenterology;  Laterality: N/A;   CORONARY ANGIOPLASTY  1992   ERCP W/ SPHICTEROTOMY  12/15/2002   LAPAROSCOPIC CHOLECYSTECTOMY  12/16/2002   LUMBAR LAMINECTOMY     L5   POLYPECTOMY  02/10/2022   Procedure: POLYPECTOMY;  Surgeon: Charlott Rakes, MD;  Location: [facility] ENDOSCOPY;  Service: Gastroenterology;;   REVERSE SHOULDER ARTHROPLASTY Right 01/09/2022   Procedure: REVERSE SHOULDER ARTHROPLASTY;  Surgeon: Yolonda Kida, MD;  Location: [facility] ORS;  Service: Orthopedics;  Laterality: Right;  150   RIGHT SHOULDER SURGERY      SKIN CANCER EXCISION     TEE WITHOUT CARDIOVERSION N/A 02/28/2016   Procedure: TRANSESOPHAGEAL ECHOCARDIOGRAM (TEE);  Surgeon: Pricilla Riffle, MD;  Location: [facility] ENDOSCOPY;  Service: Cardiovascular;  Laterality: N/A;   TOTAL HIP ARTHROPLASTY Left 07/23/2021   Procedure: TOTAL HIP ARTHROPLASTY ANTERIOR APPROACH;  Surgeon: Ollen Gross, MD;  Location: [facility] ORS;  Service: Orthopedics;  Laterality: Left;  Home Medications:  Prior to Admission medications   Medication Sig Start Date End Date Taking? Authorizing Provider  acyclovir (ZOVIRAX) 400 MG tablet TAKE 1 TABLET BY MOUTH 3 TIMES DAILY.TAKE AT FIRST FLARE/CONTINUE UNTIL SYMPTOMS GONE/ MAX 10 DAYS 05/11/22   Ardith Dark, MD  Alirocumab (PRALUENT) 150 MG/ML SOAJ Inject 150 mg into the skin every 14 (fourteen) days.    [provider]  amiodarone (PACERONE) 200 MG tablet TAKE 1 TABLET (200 MG TOTAL) BY MOUTH DAILY. \ 05/11/22   Nahser, Deloris Ping, MD   Carboxymethylcellul-Glycerin (LUBRICATING EYE DROPS OP) Place 1 drop into both eyes daily as needed (dry eyes).    [provider]  cetirizine (ZYRTEC) 10 MG tablet Take 10 mg by mouth daily as needed for allergies.    [provider]  clobetasol ointment (TEMOVATE) 0.05 % APPLY TO AFFECTED AREA TWICE A DAY 10/24/21   Ardith Dark, MD  Evolocumab (REPATHA SURECLICK) 140 MG/ML SOAJ Inject 140 mg into the skin every 14 (fourteen) days. 01/23/22   Lyn Records, MD  ketoconazole (NIZORAL) 2 % shampoo APPLY 1 TO 3 TIMES A WEEK TO SCALP. LET SIT 10 MIN THEN RINSE OUT. STRENGTH: 2 % 04/10/22   Ardith Dark, MD  lisinopril (ZESTRIL) 10 MG tablet TAKE 1 TABLET BY MOUTH DAILY. PLEASE KEEP UPCOMING APPOINTMENT WITH DR. Katrinka Blazing IN APRIL 2023 05/11/22   Nahser, Deloris Ping, MD  metoprolol succinate (TOPROL-XL) 25 MG 24 hr tablet TAKE 1 TABLET (25 MG TOTAL) BY MOUTH DAILY. PLEASE KEEP UPCOMING APPT IN APRIL 05/11/22   Nahser, Deloris Ping, MD    Inpatient Medications: Scheduled Meds:  Continuous Infusions:  PRN Meds:   Allergies:    Allergies  Allergen Reactions   Eliquis [Apixaban] Other (See Comments)    Recurrent GI bleeding occurred from diverticulosis in the setting of anticoagulation therapy for atrial fibrillation.   Food Other (See Comments)    Pt is allergic to melons caused scratchy throat    Glutamic Acid Other (See Comments)    Pt is allergic to melons caused scratchy throat    Grass Pollen(K-O-R-T-Swt Vern)     Sneezing    Ciprofloxacin Hives   Pollen Extract Other (See Comments)    Sneezing     Social History:   Social History   Socioeconomic History   Marital status: Divorced    Spouse name: Not on file   Number of children: Not on file   Years of education: Not on file   Highest education level: Not on file  Occupational History   Occupation: sales  Tobacco Use   Smoking status: Former    Current packs/day: 0.00    Average packs/day: 2.0 packs/day for  20.0 years (40.0 ttl pk-yrs)    Types: Cigarettes    Start date: 03/27/1970    Quit date: 03/27/1990    Years since quitting: 32.5   Smokeless tobacco: Never  Vaping Use   Vaping status: Never Used  Substance and Sexual Activity   Alcohol use: Yes    Comment: Social   Drug use: No   Sexual activity: Not Currently  Other Topics Concern   Not on file  Social History Narrative   Not on file   Social Determinants of Health   Financial Resource Strain: Low Risk  (10/09/2021)   Overall Financial Resource Strain (CARDIA)    Difficulty of Paying Living Expenses: Not hard at all  Food Insecurity: No Food Insecurity (10/09/2021)   Hunger Vital Sign  Worried About Programme researcher, broadcasting/film/video in the Last Year: Never true    Ran Out of Food in the Last Year: Never true  Transportation Needs: No Transportation Needs (10/09/2021)   PRAPARE - Administrator, Civil Service (Medical): No    Lack of Transportation (Non-Medical): No  Physical Activity: Insufficiently Active (10/09/2021)   Exercise Vital Sign    Days of Exercise per Week: 2 days    Minutes of Exercise per Session: 40 min  Stress: No Stress Concern Present (10/09/2021)   [employer] of Occupational Health - Occupational Stress Questionnaire    Feeling of Stress : Not at all  Social Connections: Socially Isolated (10/09/2021)   Social Connection and Isolation Panel [NHANES]    Frequency of Communication with Friends and Family: More than three times a week    Frequency of Social Gatherings with Friends and Family: More than three times a week    Attends Religious Services: Never    Database administrator or Organizations: No    Attends Banker Meetings: Never    Marital Status: Divorced  Catering manager Violence: Not At Risk (10/09/2021)   Humiliation, Afraid, Rape, and Kick questionnaire    Fear of Current or Ex-Partner: No    Emotionally Abused: No    Physically Abused: No    Sexually Abused: No     Family History:    Family History  Problem Relation Age of Onset   Other Mother        AGE 4 HEALTHY   Heart attack Father 78       2 MI   Heart disease Maternal Grandfather    Heart disease Paternal Grandfather    Emphysema Other    Other Brother      ROS:  Please see the history of present illness.   All other ROS reviewed and negative.     Physical Exam/Data:   Vitals:   09/19/22 1015 09/19/22 1100 09/19/22 1115 09/19/22 1130  BP: (!) 177/94 (!) 178/100 (!) 175/97 (!) 179/104  Pulse: 66 67 68 75  Resp: 12 14 15 13   Temp:      TempSrc:      SpO2: 97% 97% 96% 100%  Weight:      Height:       No intake or output data in the 24 hours ending 09/19/22 1328    09/19/2022    8:02 AM 07/30/2022    8:00 AM 04/01/2022    9:26 AM  Last 3 Weights  Weight (lbs) 217 lb 221 lb 3.2 oz 221 lb  Weight (kg) 98.431 kg 100.336 kg 100.245 kg     Body mass index is 27.86 kg/m.  General:  Well nourished, well developed, in no acute distress HEENT: normal Neck: no JVD Vascular: No carotid bruits; Distal pulses 2+ bilaterally Cardiac:  normal S1, S2; RRR; no murmur  Lungs:  clear to auscultation bilaterally, no wheezing, rhonchi or rales  Abd: soft, nontender, no hepatomegaly  Ext: no edema Musculoskeletal:  No deformities, BUE and BLE strength normal and equal Skin: warm and dry  Neuro:  CNs 2-12 intact, no focal abnormalities noted Psych:  Normal affect   EKG:  The EKG was personally reviewed and demonstrates: Sinus rhythm Telemetry:  Telemetry was personally reviewed and demonstrates: Sinus rhythm  Relevant CV Studies:   Laboratory Data:  High Sensitivity Troponin:   Recent Labs  Lab 09/19/22 0819 09/19/22 1004  TROPONINIHS 205* 255*  Chemistry Recent Labs  Lab 09/19/22 1142 09/19/22 1148  NA 135 138  K 4.1 4.2  CL 102 103  CO2 24  --   GLUCOSE 103* 101*  BUN 12 13  CREATININE 1.01 1.00  CALCIUM 9.4  --   GFRNONAA >60  --   ANIONGAP 9  --     No  results for input(s): "PROT", "ALBUMIN", "AST", "ALT", "ALKPHOS", "BILITOT" in the last 168 hours. Lipids No results for input(s): "CHOL", "TRIG", "HDL", "LABVLDL", "LDLCALC", "CHOLHDL" in the last 168 hours.  Hematology Recent Labs  Lab 09/19/22 0819 09/19/22 1148  WBC 5.6  --   RBC 4.24  --   HGB 14.2 15.0  HCT 42.3 44.0  MCV 99.8  --   MCH 33.5  --   MCHC 33.6  --   RDW 13.4  --   PLT 178  --    Thyroid No results for input(s): "TSH", "FREET4" in the last 168 hours.  BNP Recent Labs  Lab 09/19/22 0819  BNP 130.8*    DDimer No results for input(s): "DDIMER" in the last 168 hours.   Radiology/Studies:  DG Chest 2 View  Result Date: 09/19/2022 CLINICAL DATA:  Chest pain EXAM: CHEST - 2 VIEW COMPARISON:  06/16/2018 FINDINGS: Heart size and mediastinal contours are unremarkable. There is no pleural effusion. Scar versus platelike atelectasis is identified within the left base. No airspace consolidation. No acute osseous findings identified. IMPRESSION: 1. No acute cardiopulmonary disease. 2. Left basilar scar versus platelike atelectasis. Electronically Signed   By: Signa Kell M.D.   On: 09/19/2022 09:25     Assessment and Plan:   Non-STEMI: Patient continued to have stuttering pain.  History of RCA angioplasty.  Tommy Goostree start heparin pending CTA.  He Tariyah Pendry potentially need left heart catheterization. Paroxysmal atrial fibrillation: Currently on amiodarone.  Not anticoagulated due to history of GI bleeding. Hypertension: Elevated today.  Shaniyah Wix restart home medications.   Risk Assessment/Risk Scores:     TIMI Risk Score for Unstable Angina or Non-ST Elevation MI:   The patient's TIMI risk score is  , which indicates a  % risk of all cause mortality, new or recurrent myocardial infarction or need for urgent revascularization in the next 14 days.    CHA2DS2-VASc Score = 4   This indicates a 4.8% annual risk of stroke. The patient's score is based upon: CHF History: 0 HTN  History: 1 Diabetes History: 0 Stroke History: 0 Vascular Disease History: 1 Age Score: 2 Gender Score: 0         For questions or updates, please contact [facility] Please consult [email or web address] for contact info under    Signed, Rebacca Votaw Jorja Loa, MD  09/19/2022 1:28 PM

## 2022-09-19 NOTE — Progress Notes (Signed)
ANTICOAGULATION CONSULT NOTE - Initial Consult  Pharmacy Consult for Heparin Indication: chest pain/ACS  Allergies  Allergen Reactions   Eliquis [Apixaban] Other (See Comments)    Recurrent GI bleeding occurred from diverticulosis in the setting of anticoagulation therapy for atrial fibrillation.   Food Other (See Comments)    Pt is allergic to melons caused scratchy throat    Glutamic Acid Other (See Comments)    Pt is allergic to melons caused scratchy throat    Grass Pollen(K-O-R-T-Swt Vern)     Sneezing    Ciprofloxacin Hives   Pollen Extract Other (See Comments)    Sneezing     Patient Measurements: Height: 6\' 2"  (188 cm) Weight: 98.4 kg (217 lb) IBW/kg (Calculated) : 82.2 Heparin Dosing Weight: 98.4 kg  Vital Signs: Temp: 97.7 F (36.5 C) (08/31 1352) Temp Source: Oral (08/31 1352) BP: 179/104 (08/31 1130) Pulse Rate: 75 (08/31 1130)  Labs: Recent Labs    09/19/22 0819 09/19/22 1004 09/19/22 1142 09/19/22 1148  HGB 14.2  --   --  15.0  HCT 42.3  --   --  44.0  PLT 178  --   --   --   CREATININE  --   --  1.01 1.00  TROPONINIHS 205* 255*  --   --     Estimated Creatinine Clearance: 70.8 mL/min (by C-G formula based on SCr of 1 mg/dL).   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 Patient is presenting with chest pain. Heparin per pharmacy consult placed for chest pain/ACS.  Patient is not on anticoagulation prior to arrival secondary to history of GIB.  Hgb 15; plt 178  Goal of Therapy:  Heparin level 0.3-0.7 units/ml Monitor platelets by anticoagulation protocol: Yes   Plan:  Give IV heparin 4000 units bolus x 1 Start heparin infusion at 1200 units/hr Check anti-Xa level in 8 hours and daily while on heparin Continue to monitor H&H and platelets  Delmar Landau, PharmD, BCPS 09/19/2022 2:09 PM ED Clinical Pharmacist -  [phone]

## 2022-09-20 ENCOUNTER — Other Ambulatory Visit (HOSPITAL_COMMUNITY): Payer: Medicare Other

## 2022-09-20 ENCOUNTER — Other Ambulatory Visit: Payer: Self-pay | Admitting: Physician Assistant

## 2022-09-20 DIAGNOSIS — I2510 Atherosclerotic heart disease of native coronary artery without angina pectoris: Secondary | ICD-10-CM | POA: Insufficient documentation

## 2022-09-20 DIAGNOSIS — I214 Non-ST elevation (NSTEMI) myocardial infarction: Secondary | ICD-10-CM

## 2022-09-20 DIAGNOSIS — I480 Paroxysmal atrial fibrillation: Secondary | ICD-10-CM | POA: Insufficient documentation

## 2022-09-20 LAB — LIPID PANEL
Cholesterol: 247 mg/dL — ABNORMAL HIGH (ref 0–200)
HDL: 38 mg/dL — ABNORMAL LOW (ref >40)
LDL Cholesterol: 137 mg/dL — ABNORMAL HIGH (ref 0–99)
Total CHOL/HDL Ratio: 6.5 ratio
Triglycerides: 359 mg/dL — ABNORMAL HIGH (ref <150)
VLDL: 72 mg/dL — ABNORMAL HIGH (ref 0–40)

## 2022-09-20 LAB — HEPARIN LEVEL (UNFRACTIONATED)
Heparin Unfractionated: 0.34 [IU]/mL (ref 0.30–0.70)
Heparin Unfractionated: 0.42 [IU]/mL (ref 0.30–0.70)

## 2022-09-20 LAB — BASIC METABOLIC PANEL
Anion gap: 12 (ref 5–15)
BUN: 19 mg/dL (ref 8–23)
CO2: 22 mmol/L (ref 22–32)
Calcium: 9.5 mg/dL (ref 8.9–10.3)
Chloride: 103 mmol/L (ref 98–111)
Creatinine, Ser: 1.08 mg/dL (ref 0.61–1.24)
GFR, Estimated: >60 mL/min (ref >60)
Glucose, Bld: 99 mg/dL (ref 70–99)
Potassium: 3.7 mmol/L (ref 3.5–5.1)
Sodium: 137 mmol/L (ref 135–145)

## 2022-09-20 LAB — CBC
HCT: 41.2 % (ref 39.0–52.0)
Hemoglobin: 14.2 g/dL (ref 13.0–17.0)
MCH: 33.6 pg (ref 26.0–34.0)
MCHC: 34.5 g/dL (ref 30.0–36.0)
MCV: 97.4 fL (ref 80.0–100.0)
Platelets: 154 10*3/uL (ref 150–400)
RBC: 4.23 MIL/uL (ref 4.22–5.81)
RDW: 13.3 % (ref 11.5–15.5)
WBC: 6.2 10*3/uL (ref 4.0–10.5)
nRBC: 0 % (ref 0.0–0.2)

## 2022-09-20 LAB — TROPONIN I (HIGH SENSITIVITY)
Troponin I (High Sensitivity): 1552 ng/L (ref <18)
Troponin I (High Sensitivity): 1551 ng/L (ref <18)

## 2022-09-20 MED ORDER — HEPARIN (PORCINE) 25000 UT/250ML-% IV SOLN
1400.0000 [IU]/h | INTRAVENOUS | Status: DC
  Administered 2022-09-21 (×2): 1400 [IU]/h via INTRAVENOUS
  Filled 2022-09-20 (×2): qty 250

## 2022-09-20 MED ORDER — LORATADINE 10 MG PO TABS
10.0000 mg | ORAL_TABLET | Freq: Every day | ORAL | Status: DC
  Administered 2022-09-20 – 2022-09-23 (×4): 10 mg via ORAL
  Filled 2022-09-20 (×4): qty 1

## 2022-09-20 MED ORDER — ACETAMINOPHEN 500 MG PO TABS
1000.0000 mg | ORAL_TABLET | Freq: Four times a day (QID) | ORAL | Status: DC | PRN
  Administered 2022-09-20 – 2022-09-21 (×2): 1000 mg via ORAL
  Filled 2022-09-20 (×2): qty 2

## 2022-09-20 MED ORDER — ATORVASTATIN CALCIUM 40 MG PO TABS
40.0000 mg | ORAL_TABLET | Freq: Every day | ORAL | Status: DC
  Administered 2022-09-20 – 2022-09-23 (×4): 40 mg via ORAL
  Filled 2022-09-20 (×4): qty 1

## 2022-09-20 MED ORDER — METOPROLOL SUCCINATE ER 50 MG PO TB24
50.0000 mg | ORAL_TABLET | Freq: Every day | ORAL | Status: DC
  Administered 2022-09-20 – 2022-09-23 (×4): 50 mg via ORAL
  Filled 2022-09-20 (×4): qty 1

## 2022-09-20 MED ORDER — HEPARIN BOLUS VIA INFUSION
4000.0000 [IU] | Freq: Once | INTRAVENOUS | Status: AC
  Administered 2022-09-20: 4000 [IU] via INTRAVENOUS
  Filled 2022-09-20: qty 4000

## 2022-09-20 MED ORDER — LISINOPRIL 20 MG PO TABS
20.0000 mg | ORAL_TABLET | Freq: Every day | ORAL | Status: DC
  Administered 2022-09-20 – 2022-09-21 (×2): 20 mg via ORAL
  Filled 2022-09-20 (×2): qty 1

## 2022-09-20 NOTE — Progress Notes (Signed)
ANTICOAGULATION CONSULT NOTE- Follow Up  Pharmacy Consult for Heparin Indication: chest pain/ACS  Allergies  Allergen Reactions   Eliquis [Apixaban] Other (See Comments)    Recurrent GI bleeding occurred from diverticulosis in the setting of anticoagulation therapy for atrial fibrillation.   Food Other (See Comments)    Pt is allergic to melons caused scratchy throat    Glutamic Acid Other (See Comments)    Pt is allergic to melons caused scratchy throat    Grass Pollen(K-O-R-T-Swt Vern)     Sneezing    Ciprofloxacin Hives   Pollen Extract Other (See Comments)    Sneezing     Patient Measurements: Height: 6\' 2"  (188 cm) Weight: 98.4 kg (217 lb) IBW/kg (Calculated) : 82.2 Heparin Dosing Weight: 98.4 kg  Vital Signs: Temp: 97.3 F (36.3 C) (09/01 0303) Temp Source: Axillary (09/01 0303) BP: 153/99 (09/01 0303) Pulse Rate: 76 (09/01 0303)  Labs: Recent Labs    09/19/22 0819 09/19/22 1004 09/19/22 1142 09/19/22 1148 09/19/22 1622 09/19/22 2210 09/20/22 0500  HGB 14.2  --   --  15.0  --   --  14.2  HCT 42.3  --   --  44.0  --   --  41.2  PLT 178  --   --   --   --   --  154  HEPARINUNFRC  --   --   --   --   --  0.23* 0.34  CREATININE  --   --  1.01 1.00 1.00  --   --   TROPONINIHS 205* 255*  --   --   --   --   --     Estimated Creatinine Clearance: 70.8 mL/min (by C-G formula based on SCr of 1 mg/dL).   Medical History: Past Medical History:  Diagnosis Date   Acute lower GI bleeding 03/19/2016   Arthritis    Atrial fibrillation with RVR (HCC) 02/21/2016   Coronary artery disease    Diverticulosis 04/18/2016   Dysrhythmia    AFIB   Endocarditis of native valve    Former smoker    History of MI (myocardial infarction) 1992   Hyperlipidemia    Hypertension    Infection of intervertebral disc (HCC)    Insomnia 04/18/2016   Left retinal detachment 03/18/2015   Loose body of right shoulder    Myocardial infarction (HCC)    Proliferative  vitreoretinopathy of left eye 07/08/2015   Pseudophakia of left eye 05/22/2013   Pseudophakia of right eye 06/05/2013   S/P coronary angioplasty    Skin cancer    Hands, arms, nose   Streptococcal bacteremia     Medications:  Medications Prior to Admission  Medication Sig Dispense Refill Last Dose   amiodarone (PACERONE) 200 MG tablet TAKE 1 TABLET (200 MG TOTAL) BY MOUTH DAILY. \ 90 tablet 3 09/19/2022   Carboxymethylcellul-Glycerin (LUBRICATING EYE DROPS OP) Place 1 drop into both eyes daily as needed (dry eyes).   09/19/2022   clobetasol ointment (TEMOVATE) 0.05 % APPLY TO AFFECTED AREA TWICE A DAY 30 g 2 09/19/2022   ketoconazole (NIZORAL) 2 % shampoo APPLY 1 TO 3 TIMES A WEEK TO SCALP. LET SIT 10 MIN THEN RINSE OUT. STRENGTH: 2 % (Patient taking differently: Apply 1 Application topically 3 (three) times a week. APPLY 1 TO 3 TIMES A WEEK TO SCALP. LET SIT 10 MIN THEN RINSE OUT. STRENGTH: 2 %) 120 mL 3 Past Week   lisinopril (ZESTRIL) 10 MG tablet TAKE 1 TABLET BY  MOUTH DAILY. PLEASE KEEP UPCOMING APPOINTMENT WITH DR. Katrinka Blazing IN APRIL 2023 90 tablet 3 09/19/2022   metoprolol succinate (TOPROL-XL) 25 MG 24 hr tablet TAKE 1 TABLET (25 MG TOTAL) BY MOUTH DAILY. PLEASE KEEP UPCOMING APPT IN APRIL 90 tablet 3 09/19/2022   valACYclovir (VALTREX) 1000 MG tablet Take 2 tablets by mouth daily. If having a flare up, pt will take an additional tablet.   09/14/2022   Evolocumab (REPATHA SURECLICK) 140 MG/ML SOAJ Inject 140 mg into the skin every 14 (fourteen) days. 6 mL 3 09/16/2022   Scheduled:   amiodarone  200 mg Oral Daily   aspirin EC  81 mg Oral Daily   lisinopril  10 mg Oral Daily   loratadine  10 mg Oral Daily   metoprolol succinate  25 mg Oral Daily   pantoprazole  40 mg Oral Daily   Infusions:   heparin 1,400 Units/hr (09/19/22 2317)   PRN: acetaminophen, hydrALAZINE, nitroGLYCERIN, ondansetron (ZOFRAN) IV  Assessment: 77 yom with a history of AF, CAD. Patient is presenting with chest  pain. Heparin per pharmacy consult placed for chest pain/ACS.  Patient is not on anticoagulation prior to arrival secondary to history of GIB.  9/1 AM: heparin level= 0.34 on 1400 units/hr (therapeutic). No signs/symptoms of bleeding reported or issues with the heparin infusion. Slight drop in Hgb and plts, both still within normal limits.   Goal of Therapy:  Heparin level 0.3-0.7 units/ml Monitor platelets by anticoagulation protocol: Yes   Plan:  -Continue heparin infusion at 1400 units/hr -Confirmatory heparin level in 6 hours  -Heparin level and CBC daily  -Monitor for signs/symptoms of bleeding  Arabella Merles, PharmD. Clinical Pharmacist 09/20/2022 5:53 AM

## 2022-09-20 NOTE — Progress Notes (Addendum)
Rounding Note    Patient Name: Jonathan Holden Date of Encounter: 09/20/2022  [facility] Cardiologist: Kristeen Miss, MD   Subjective   Patient feeling well without complaint.  Had recurrent chest discomfort yesterday relieved by Protonix.  Inpatient Medications    Scheduled Meds:  amiodarone  200 mg Oral Daily   aspirin EC  81 mg Oral Daily   lisinopril  20 mg Oral Daily   loratadine  10 mg Oral Daily   metoprolol succinate  50 mg Oral Daily   pantoprazole  40 mg Oral Daily   Continuous Infusions:  PRN Meds: acetaminophen, hydrALAZINE, nitroGLYCERIN, ondansetron (ZOFRAN) IV   Vital Signs    Vitals:   09/19/22 1930 09/20/22 0025 09/20/22 0303 09/20/22 0746  BP: (!) 142/75 (!) 149/87 (!) 153/99   Pulse: 76  76   Resp: 18 17 16 16   Temp: 98.4 F (36.9 C) 97.9 F (36.6 C) (!) 97.3 F (36.3 C) 97.7 F (36.5 C)  TempSrc: Oral Axillary Axillary Oral  SpO2: 97% 97% 94%   Weight:      Height:        Intake/Output Summary (Last 24 hours) at 09/20/2022 1006 Last data filed at 09/20/2022 0700 Gross per 24 hour  Intake 251.91 ml  Output --  Net 251.91 ml      09/19/2022    2:42 PM 09/19/2022    8:02 AM 07/30/2022    8:00 AM  Last 3 Weights  Weight (lbs) 217 lb 217 lb 221 lb 3.2 oz  Weight (kg) 98.431 kg 98.431 kg 100.336 kg      Telemetry    Sinus rhythm- Personally Reviewed  ECG    None new- Personally Reviewed  Physical Exam   GEN: No acute distress.   Neck: No JVD Cardiac: RRR, no murmurs, rubs, or gallops.  Respiratory: Clear to auscultation bilaterally. GI: Soft, nontender, non-distended  MS: No edema; No deformity. Neuro:  Nonfocal  Psych: Normal affect   Labs    High Sensitivity Troponin:   Recent Labs  Lab 09/19/22 0819 09/19/22 1004  TROPONINIHS 205* 255*     Chemistry Recent Labs  Lab 09/19/22 1142 09/19/22 1148 09/19/22 1622 09/20/22 0500  NA 135 138 135 137  K 4.1 4.2 3.7 3.7  CL 102 103 101 103  CO2 24  --   24 22  GLUCOSE 103* 101* 94 99  BUN 12 13 11 19   CREATININE 1.01 1.00 1.00 1.08  CALCIUM 9.4  --  9.2 9.5  PROT  --   --  6.8  --   ALBUMIN  --   --  3.6  --   AST  --   --  23  --   ALT  --   --  17  --   ALKPHOS  --   --  61  --   BILITOT  --   --  1.0  --   GFRNONAA >60  --  >60 >60  ANIONGAP 9  --  10 12    Lipids  Recent Labs  Lab 09/20/22 0500  CHOL 247*  TRIG 359*  HDL 38*  LDLCALC 137*  CHOLHDL 6.5    Hematology Recent Labs  Lab 09/19/22 0819 09/19/22 1148 09/20/22 0500  WBC 5.6  --  6.2  RBC 4.24  --  4.23  HGB 14.2 15.0 14.2  HCT 42.3 44.0 41.2  MCV 99.8  --  97.4  MCH 33.5  --  33.6  MCHC  33.6  --  34.5  RDW 13.4  --  13.3  PLT 178  --  154   Thyroid No results for input(s): "TSH", "FREET4" in the last 168 hours.  BNP Recent Labs  Lab 09/19/22 0819  BNP 130.8*    DDimer No results for input(s): "DDIMER" in the last 168 hours.   Radiology    CT Angio Chest PE W and/or Wo Contrast  Result Date: 09/19/2022 CLINICAL DATA:  Pulmonary embolism suspected. High probability. New onset chest pain overnight. No known DVT. EXAM: CT ANGIOGRAPHY CHEST WITH CONTRAST TECHNIQUE: Multidetector CT imaging of the chest was performed using the standard protocol during bolus administration of intravenous contrast. Multiplanar CT image reconstructions and MIPs were obtained to evaluate the vascular anatomy. RADIATION DOSE REDUCTION: This exam was performed according to the departmental dose-optimization program which includes automated exposure control, adjustment of the mA and/or kV according to patient size and/or use of iterative reconstruction technique. CONTRAST:  75mL OMNIPAQUE IOHEXOL 350 MG/ML SOLN COMPARISON:  None Available. FINDINGS: Cardiovascular: Satisfactory opacification of the pulmonary arteries to the segmental level. No evidence of pulmonary embolism. Normal heart size. No pericardial effusion. Aortic atherosclerosis. Coronary artery calcifications.  Mediastinum/Nodes: Thyroid gland, trachea, and esophagus are normal. No mediastinal or hilar adenopathy. Lungs/Pleura: No pleural effusion or consolidative change. Paraseptal and centrilobular emphysema with diffuse bronchial wall thickening. Mild, heterogeneous ground-glass attenuation is identified bilaterally with areas of air trapping. No pneumothorax identified. Perifissural nodule along the minor fissure of the right lung likely represents a benign intrapulmonary lymph node measuring 4 mm. Calcified granulomas noted within the right upper lobe. Upper Abdomen: No acute abnormality. Musculoskeletal: No chest wall abnormality. No acute or significant osseous findings. Review of the MIP images confirms the above findings. IMPRESSION: 1. No evidence for acute pulmonary embolus. 2. Diffuse bronchial wall thickening with emphysema, as above; imaging findings suggestive of underlying COPD. 3. Mild, heterogeneous ground-glass attenuation is identified bilaterally with areas of air trapping. Findings are nonspecific but may be seen in the setting of small airways disease. 4. Coronary artery calcifications. 5. Aortic Atherosclerosis (ICD10-I70.0) and Emphysema (ICD10-J43.9). Electronically Signed   By: Signa Jonathan M.D.   On: 09/19/2022 13:39   DG Chest 2 View  Result Date: 09/19/2022 CLINICAL DATA:  Chest pain EXAM: CHEST - 2 VIEW COMPARISON:  06/16/2018 FINDINGS: Heart size and mediastinal contours are unremarkable. There is no pleural effusion. Scar versus platelike atelectasis is identified within the left base. No airspace consolidation. No acute osseous findings identified. IMPRESSION: 1. No acute cardiopulmonary disease. 2. Left basilar scar versus platelike atelectasis. Electronically Signed   By: Signa Jonathan M.D.   On: 09/19/2022 09:25    Cardiac Studies     Patient Profile     77 y.o. male presented to the hospital with chest pain, found to be hypertensive with mildly elevated  troponin  Assessment & Plan    1.  Coronary artery disease: Presented with severely elevated blood pressure and mildly elevated troponin.  Repeat troponin significantly elevated. Jonathan Holden continue heparin and plan for cath Monday.  2.  Paroxysmal atrial fibrillation: Currently on amiodarone.  Not anticoagulated due to GI bleed  3.  Hypertension: Remains elevated.  Jonathan Holden increase lisinopril and metoprolol  4.  GERD: Have advised Prilosec over-the-counter until he can speak with his primary physician  For questions or updates, please contact [facility] Please consult [email or web address] for contact info under        Signed, Britley Gashi Jorja Loa, MD  09/20/2022, 10:06 AM

## 2022-09-20 NOTE — Progress Notes (Signed)
Date and time results received: 09/20/22 1325  Test: Troponin Critical Value: 1552  Name of Provider Notified: Robet Leu, PA  PA paged. Awaiting response. Will continue to monitor patient.

## 2022-09-20 NOTE — Progress Notes (Signed)
F/u hsTroponin obtained given overnight heartburn came back 1552. He remains pain free. D/w Dr. Elberta Fortis. Will restart IV heparin and cancel discharge. Obtain inpatient echo and plan cath on Tuesday. Continue ASA. Patient open to restarting atorvastatin, unclear why this fell off his list a few years ago (patient thought it was because his injections had been started). Discussed with patient and nurse. He is agreeable to plan. He notes he has a headache, no other focal neurologic symptoms, and he remains chest pain free. Will give Tylenol for headache and ask patient to update if no relief. Will f/u another troponin to trend to peak at 3pm. Signed out to Robet Leu to f/u this level.  Informed Consent   Shared Decision Making/Informed Consent The risks [stroke (1 in 1000), death (1 in 1000), kidney failure [usually temporary] (1 in 500), bleeding (1 in 200), allergic reaction [possibly serious] (1 in 200)], benefits (diagnostic support and management of coronary artery disease) and alternatives of a cardiac catheterization were discussed in detail with Jonathan Holden and he is willing to proceed.  I wrote his name on add-on board and placed standard pre-cath orders. To rounding team that will follow: If his LV function comes back abnormal or he develops interim signs of fluid overload, please adjust pre-cath fluid orders for Tuesday.

## 2022-09-20 NOTE — Progress Notes (Signed)
ANTICOAGULATION CONSULT NOTE - Initial Consult  Pharmacy Consult for heparin Indication: chest pain/ACS  Allergies  Allergen Reactions   Eliquis [Apixaban] Other (See Comments)    Recurrent GI bleeding occurred from diverticulosis in the setting of anticoagulation therapy for atrial fibrillation.   Food Other (See Comments)    Pt is allergic to melons caused scratchy throat    Glutamic Acid Other (See Comments)    Pt is allergic to melons caused scratchy throat    Grass Pollen(K-O-R-T-Swt Vern)     Sneezing    Ciprofloxacin Hives   Pollen Extract Other (See Comments)    Sneezing     Patient Measurements: Height: 6\' 2"  (188 cm) Weight: 98.4 kg (217 lb) IBW/kg (Calculated) : 82.2 Heparin Dosing Weight: 98.4 kg  Vital Signs: Temp: 97.7 F (36.5 C) (09/01 1158) Temp Source: Oral (09/01 1158) BP: 150/85 (09/01 1158) Pulse Rate: 72 (09/01 1158)  Labs: Recent Labs    09/19/22 0819 09/19/22 1004 09/19/22 1142 09/19/22 1148 09/19/22 1622 09/19/22 2210 09/20/22 0500 09/20/22 1219  HGB 14.2  --   --  15.0  --   --  14.2  --   HCT 42.3  --   --  44.0  --   --  41.2  --   PLT 178  --   --   --   --   --  154  --   HEPARINUNFRC  --   --   --   --   --  0.23* 0.34  --   CREATININE  --   --    < > 1.00 1.00  --  1.08  --   TROPONINIHS 205* 255*  --   --   --   --   --  1,552*   < > = values in this interval not displayed.    Estimated Creatinine Clearance: 65.5 mL/min (by C-G formula based on SCr of 1.08 mg/dL).   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 Patient presented with elevated BP and mildly elevated troponin, initially thought to be non-STEMI but chest pain was resolved with antacids. Originally planned for discharge today with outpatient cath, however repeat troponin on 9/1 was elevated to 1,552. Discharge  was canceled and pharmacy was consulted to restart heparin.   Patient was previously on 1400 units/hr and last heparin level was therapeutic at 0.34. Heparin was discontinued this morning at 0900. He has been off heparin for ~5 hours, will plan to re-bolus and restart heparin drip.  Goal of Therapy:  Heparin level 0.3-0.7 units/ml Monitor platelets by anticoagulation protocol: Yes   Plan:  Give 4000 units bolus x 1 Start heparin infusion at 1400 units/hr Check anti-Xa level in 8 hours and daily while on heparin Continue to monitor H&H and platelets  Lennie Muckle, PharmD PGY1 Pharmacy Resident 09/20/2022 2:11 PM

## 2022-09-20 NOTE — Progress Notes (Signed)
ANTICOAGULATION CONSULT NOTE - Follow Up  Pharmacy Consult for heparin Indication: chest pain/ACS  Allergies  Allergen Reactions   Eliquis [Apixaban] Other (See Comments)    Recurrent GI bleeding occurred from diverticulosis in the setting of anticoagulation therapy for atrial fibrillation.   Food Other (See Comments)    Pt is allergic to melons caused scratchy throat    Glutamic Acid Other (See Comments)    Pt is allergic to melons caused scratchy throat    Grass Pollen(K-O-R-T-Swt Vern)     Sneezing    Ciprofloxacin Hives   Pollen Extract Other (See Comments)    Sneezing     Patient Measurements: Height: 6\' 2"  (188 cm) Weight: 98.4 kg (217 lb) IBW/kg (Calculated) : 82.2 Heparin Dosing Weight: 98.4 kg  Vital Signs: Temp: 97.4 F (36.3 C) (09/01 1923) Temp Source: Oral (09/01 1923) BP: 145/82 (09/01 1923) Pulse Rate: 72 (09/01 1923)  Labs: Recent Labs    09/19/22 0819 09/19/22 1004 09/19/22 1142 09/19/22 1148 09/19/22 1622 09/19/22 2210 09/20/22 0500 09/20/22 1219 09/20/22 1504 09/20/22 2254  HGB 14.2  --   --  15.0  --   --  14.2  --   --   --   HCT 42.3  --   --  44.0  --   --  41.2  --   --   --   PLT 178  --   --   --   --   --  154  --   --   --   HEPARINUNFRC  --   --   --   --   --  0.23* 0.34  --   --  0.42  CREATININE  --   --    < > 1.00 1.00  --  1.08  --   --   --   TROPONINIHS 205* 255*  --   --   --   --   --  1,552* 1,551*  --    < > = values in this interval not displayed.    Estimated Creatinine Clearance: 65.5 mL/min (by C-G formula based on SCr of 1.08 mg/dL).   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 Patient presented with elevated BP and mildly elevated troponin, initially thought to be non-STEMI but chest pain was resolved with antacids. Originally planned for discharge today with outpatient cath, however repeat troponin on 9/1 was elevated to 1,552. Discharge was canceled and pharmacy was consulted to restart heparin.   Patient was previously on 1400 units/hr and last heparin level was therapeutic at 0.34. Heparin was discontinued this morning at 0900, but restarted given increase elevation in troponin.Marland Kitchen He has been off heparin for ~5 hours, will plan to re-bolus and restart heparin drip.  9/1 PM: heparin level returned at 0.42 on 1400 units/hr (therapeutic). No signs/symptoms of bleeding noted or issues with the infusion. Last CBC stable  Goal of Therapy:  Heparin level 0.3-0.7 units/ml Monitor platelets by anticoagulation protocol: Yes   Plan:  Continue heparin infusion at 1400 units/hr Check anti-Xa level daily while on heparin Continue to monitor H&H and platelets  Arabella Merles, PharmD. Clinical Pharmacist 09/20/2022 11:37 PM

## 2022-09-20 NOTE — Progress Notes (Addendum)
Plan changed, outpatient orders cancelled

## 2022-09-21 ENCOUNTER — Inpatient Hospital Stay (HOSPITAL_COMMUNITY): Payer: Medicare Other

## 2022-09-21 DIAGNOSIS — I214 Non-ST elevation (NSTEMI) myocardial infarction: Secondary | ICD-10-CM | POA: Diagnosis not present

## 2022-09-21 LAB — CBC
HCT: 45.6 % (ref 39.0–52.0)
Hemoglobin: 15.1 g/dL (ref 13.0–17.0)
MCH: 33.9 pg (ref 26.0–34.0)
MCHC: 33.1 g/dL (ref 30.0–36.0)
MCV: 102.5 fL — ABNORMAL HIGH (ref 80.0–100.0)
Platelets: 190 10*3/uL (ref 150–400)
RBC: 4.45 MIL/uL (ref 4.22–5.81)
RDW: 13.5 % (ref 11.5–15.5)
WBC: 6.4 10*3/uL (ref 4.0–10.5)
nRBC: 0 % (ref 0.0–0.2)

## 2022-09-21 LAB — BASIC METABOLIC PANEL
Anion gap: 9 (ref 5–15)
BUN: 18 mg/dL (ref 8–23)
CO2: 25 mmol/L (ref 22–32)
Calcium: 9.3 mg/dL (ref 8.9–10.3)
Chloride: 102 mmol/L (ref 98–111)
Creatinine, Ser: 1.21 mg/dL (ref 0.61–1.24)
GFR, Estimated: >60 mL/min (ref >60)
Glucose, Bld: 93 mg/dL (ref 70–99)
Potassium: 3.9 mmol/L (ref 3.5–5.1)
Sodium: 136 mmol/L (ref 135–145)

## 2022-09-21 LAB — ECHOCARDIOGRAM COMPLETE
Area-P 1/2: 2.69 cm2
Height: 74 in
S' Lateral: 3.6 cm
Weight: 3472 [oz_av]

## 2022-09-21 LAB — HEPARIN LEVEL (UNFRACTIONATED): Heparin Unfractionated: 0.34 [IU]/mL (ref 0.30–0.70)

## 2022-09-21 MED ORDER — BACITRACIN-NEOMYCIN-POLYMYXIN OINTMENT TUBE
TOPICAL_OINTMENT | Freq: Two times a day (BID) | CUTANEOUS | Status: DC | PRN
  Filled 2022-09-21: qty 14

## 2022-09-21 MED ORDER — PERFLUTREN LIPID MICROSPHERE
1.0000 mL | INTRAVENOUS | Status: AC | PRN
  Administered 2022-09-21: 3 mL via INTRAVENOUS

## 2022-09-21 MED ORDER — NEOMYCIN-POLYMYXIN-PRAMOXINE 1 % EX CREA
TOPICAL_CREAM | Freq: Two times a day (BID) | CUTANEOUS | Status: DC | PRN
  Filled 2022-09-21: qty 28

## 2022-09-21 MED ORDER — SODIUM CHLORIDE 0.9 % WEIGHT BASED INFUSION
3.0000 mL/kg/h | INTRAVENOUS | Status: DC
  Administered 2022-09-22: 3 mL/kg/h via INTRAVENOUS

## 2022-09-21 MED ORDER — ASPIRIN 81 MG PO CHEW
81.0000 mg | CHEWABLE_TABLET | ORAL | Status: AC
  Administered 2022-09-22: 81 mg via ORAL
  Filled 2022-09-21: qty 1

## 2022-09-21 MED ORDER — SODIUM CHLORIDE 0.9 % WEIGHT BASED INFUSION: 1.0000 mL/kg/h | INTRAVENOUS | Status: DC

## 2022-09-21 NOTE — H&P (View-Only) (Signed)
   Patient Name: Jonathan Holden Date of Encounter: 09/21/2022 [facility] Cardiologist: Kristeen Miss, MD   Interval Summary  .    No acute events overnight.  No recurrent chest discomfort since PPI yesterday.  Vital Signs .    Vitals:   09/20/22 1158 09/20/22 1541 09/20/22 1923 09/21/22 0411  BP: (!) 150/85 (!) 137/93 (!) 145/82 114/72  Pulse: 72 78 72 68  Resp: 16 18 17 18   Temp: 97.7 F (36.5 C) 97.8 F (36.6 C) (!) 97.4 F (36.3 C) 97.6 F (36.4 C)  TempSrc: Oral Oral Oral Oral  SpO2: 96% 96% 96% 97%  Weight:      Height:        Intake/Output Summary (Last 24 hours) at 09/21/2022 0744 Last data filed at 09/21/2022 0416 Gross per 24 hour  Intake 540 ml  Output 750 ml  Net -210 ml      09/19/2022    2:42 PM 09/19/2022    8:02 AM 07/30/2022    8:00 AM  Last 3 Weights  Weight (lbs) 217 lb 217 lb 221 lb 3.2 oz  Weight (kg) 98.431 kg 98.431 kg 100.336 kg      Telemetry/ECG    SR- Personally Reviewed  Physical Exam .   GEN: No acute distress.   Neck: No JVD Cardiac: RRR, no murmurs, rubs, or gallops.  Respiratory: Clear to auscultation bilaterally. GI: Soft, nontender, non-distended  MS: No edema  Assessment & Plan .      ACS:  Cont ASA, Toprol, and heparin gtt.  Not on A/C for atrial fibrillation so could be treated with ticagrelor (vs clopidogrel) tomorrow if PCI is required.  Follow up TTE today.  NPO at [facility] for cath. HL:  On Praluent as outpatient PAF:  History of GI bleed, has been seen as outpatient for potential LAAC HTN:  BP well controlled this AM. CAD:  History of remote PTCA of RCA without stenting.  Cath in AM. GERD:  Cont PPI.  I have reviewed the risks, indications, and alternatives to cardiac catheterization, possible angioplasty, and stenting with the patient. Risks include but are not limited to bleeding, infection, vascular injury, stroke, myocardial infection, arrhythmia, kidney injury, radiation-related injury in the case of  prolonged fluoroscopy use, emergency cardiac surgery, and death. The patient understands the risks of serious complication is 1-2 in 1000 with diagnostic cardiac cath and 1-2% or less with angioplasty/stenting.    For questions or updates, please contact [facility] Please consult [email or web address] for contact info under        Signed, Orbie Pyo, MD

## 2022-09-21 NOTE — Progress Notes (Signed)
ANTICOAGULATION CONSULT NOTE - Follow Up  Pharmacy Consult for heparin Indication: chest pain/ACS  Allergies  Allergen Reactions   Eliquis [Apixaban] Other (See Comments)    Recurrent GI bleeding occurred from diverticulosis in the setting of anticoagulation therapy for atrial fibrillation.   Food Other (See Comments)    Pt is allergic to melons caused scratchy throat    Glutamic Acid Other (See Comments)    Pt is allergic to melons caused scratchy throat    Grass Pollen(K-O-R-T-Swt Vern)     Sneezing    Ciprofloxacin Hives   Pollen Extract Other (See Comments)    Sneezing     Patient Measurements: Height: 6\' 2"  (188 cm) Weight: 98.4 kg (217 lb) IBW/kg (Calculated) : 82.2 Heparin Dosing Weight: 98.4 kg  Vital Signs: Temp: 97.6 F (36.4 C) (09/02 0411) Temp Source: Oral (09/02 0411) BP: 114/72 (09/02 0411) Pulse Rate: 68 (09/02 0411)  Labs: Recent Labs    09/19/22 0819 09/19/22 1004 09/19/22 1142 09/19/22 1148 09/19/22 1622 09/19/22 2210 09/20/22 0500 09/20/22 1219 09/20/22 1504 09/20/22 2254 09/21/22 0242  HGB 14.2  --   --  15.0  --   --  14.2  --   --   --  15.1  HCT 42.3  --   --  44.0  --   --  41.2  --   --   --  45.6  PLT 178  --   --   --   --   --  154  --   --   --  190  HEPARINUNFRC  --   --   --   --   --    < > 0.34  --   --  0.42 0.34  CREATININE  --   --    < > 1.00 1.00  --  1.08  --   --   --  1.21  TROPONINIHS 205* 255*  --   --   --   --   --  1,552* 1,551*  --   --    < > = values in this interval not displayed.    Estimated Creatinine Clearance: 58.5 mL/min (by C-G formula based on SCr of 1.21 mg/dL).   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 Patient presented with elevated BP and mildly elevated troponin, initially thought to be non-STEMI but chest pain was resolved with antacids. Originally planned for discharge today with outpatient cath, however repeat troponin on 9/1 was elevated to 1,552. Discharge was canceled and pharmacy was consulted to restart heparin.   Heparin level returned therapeutic at 0.34 on 1400 units/hr. CBC table. No issues with infusion or bleeding noted. Noted plan to go to cath on 9/3.   Goal of Therapy:  Heparin level 0.3-0.7 units/ml Monitor platelets by anticoagulation protocol: Yes   Plan:  Continue heparin infusion at 1400 units/hr Check anti-Xa level daily while on heparin Continue to monitor H&H and platelets  Lennie Muckle, PharmD PGY1 Pharmacy Resident 09/21/2022 7:28 AM

## 2022-09-21 NOTE — Progress Notes (Signed)
*  PRELIMINARY RESULTS* Echocardiogram 2D Echocardiogram has been performed with Definity.  Stacey Drain 09/21/2022, 3:15 PM

## 2022-09-21 NOTE — Progress Notes (Addendum)
   Patient Name: Jonathan Holden Date of Encounter: 09/21/2022 [facility] Cardiologist: Kristeen Miss, MD   Interval Summary  .    No acute events overnight.  No recurrent chest discomfort since PPI yesterday.  Vital Signs .    Vitals:   09/20/22 1158 09/20/22 1541 09/20/22 1923 09/21/22 0411  BP: (!) 150/85 (!) 137/93 (!) 145/82 114/72  Pulse: 72 78 72 68  Resp: 16 18 17 18   Temp: 97.7 F (36.5 C) 97.8 F (36.6 C) (!) 97.4 F (36.3 C) 97.6 F (36.4 C)  TempSrc: Oral Oral Oral Oral  SpO2: 96% 96% 96% 97%  Weight:      Height:        Intake/Output Summary (Last 24 hours) at 09/21/2022 0744 Last data filed at 09/21/2022 0416 Gross per 24 hour  Intake 540 ml  Output 750 ml  Net -210 ml      09/19/2022    2:42 PM 09/19/2022    8:02 AM 07/30/2022    8:00 AM  Last 3 Weights  Weight (lbs) 217 lb 217 lb 221 lb 3.2 oz  Weight (kg) 98.431 kg 98.431 kg 100.336 kg      Telemetry/ECG    SR- Personally Reviewed  Physical Exam .   GEN: No acute distress.   Neck: No JVD Cardiac: RRR, no murmurs, rubs, or gallops.  Respiratory: Clear to auscultation bilaterally. GI: Soft, nontender, non-distended  MS: No edema  Assessment & Plan .      ACS:  Cont ASA, Toprol, and heparin gtt.  Not on A/C for atrial fibrillation so could be treated with ticagrelor (vs clopidogrel) tomorrow if PCI is required.  Follow up TTE today.  NPO at [facility] for cath. HL:  On Praluent as outpatient PAF:  History of GI bleed, has been seen as outpatient for potential LAAC HTN:  BP well controlled this AM. CAD:  History of remote PTCA of RCA without stenting.  Cath in AM. GERD:  Cont PPI.  I have reviewed the risks, indications, and alternatives to cardiac catheterization, possible angioplasty, and stenting with the patient. Risks include but are not limited to bleeding, infection, vascular injury, stroke, myocardial infection, arrhythmia, kidney injury, radiation-related injury in the case of  prolonged fluoroscopy use, emergency cardiac surgery, and death. The patient understands the risks of serious complication is 1-2 in 1000 with diagnostic cardiac cath and 1-2% or less with angioplasty/stenting.    For questions or updates, please contact [facility] Please consult [email or web address] for contact info under        Signed, Orbie Pyo, MD

## 2022-09-22 ENCOUNTER — Encounter (HOSPITAL_COMMUNITY): Payer: Self-pay | Admitting: Cardiovascular Disease

## 2022-09-22 ENCOUNTER — Encounter (HOSPITAL_COMMUNITY)
Admission: EM | Disposition: A | Discharge status: Home / Self Care | Payer: Self-pay | Source: Home / Self Care | Attending: Cardiology

## 2022-09-22 DIAGNOSIS — I10 Essential (primary) hypertension: Secondary | ICD-10-CM

## 2022-09-22 DIAGNOSIS — I2510 Atherosclerotic heart disease of native coronary artery without angina pectoris: Secondary | ICD-10-CM | POA: Diagnosis not present

## 2022-09-22 DIAGNOSIS — E785 Hyperlipidemia, unspecified: Secondary | ICD-10-CM | POA: Diagnosis not present

## 2022-09-22 DIAGNOSIS — I214 Non-ST elevation (NSTEMI) myocardial infarction: Secondary | ICD-10-CM | POA: Diagnosis not present

## 2022-09-22 HISTORY — PX: LEFT HEART CATH AND CORONARY ANGIOGRAPHY: CATH118249

## 2022-09-22 LAB — LIPOPROTEIN A (LPA): Lipoprotein (a): 45.9 nmol/L — ABNORMAL HIGH (ref <75.0)

## 2022-09-22 LAB — CBC
HCT: 41.9 % (ref 39.0–52.0)
Hemoglobin: 14.2 g/dL (ref 13.0–17.0)
MCH: 34.5 pg — ABNORMAL HIGH (ref 26.0–34.0)
MCHC: 33.9 g/dL (ref 30.0–36.0)
MCV: 101.7 fL — ABNORMAL HIGH (ref 80.0–100.0)
Platelets: 155 10*3/uL (ref 150–400)
RBC: 4.12 MIL/uL — ABNORMAL LOW (ref 4.22–5.81)
RDW: 13.5 % (ref 11.5–15.5)
WBC: 5.2 10*3/uL (ref 4.0–10.5)
nRBC: 0 % (ref 0.0–0.2)

## 2022-09-22 LAB — BASIC METABOLIC PANEL
Anion gap: 11 (ref 5–15)
BUN: 20 mg/dL (ref 8–23)
CO2: 22 mmol/L (ref 22–32)
Calcium: 9.2 mg/dL (ref 8.9–10.3)
Chloride: 104 mmol/L (ref 98–111)
Creatinine, Ser: 1.44 mg/dL — ABNORMAL HIGH (ref 0.61–1.24)
GFR, Estimated: 50 mL/min — ABNORMAL LOW (ref >60)
Glucose, Bld: 95 mg/dL (ref 70–99)
Potassium: 4 mmol/L (ref 3.5–5.1)
Sodium: 137 mmol/L (ref 135–145)

## 2022-09-22 LAB — HEPARIN LEVEL (UNFRACTIONATED): Heparin Unfractionated: 0.34 [IU]/mL (ref 0.30–0.70)

## 2022-09-22 SURGERY — LEFT HEART CATH AND CORONARY ANGIOGRAPHY
Anesthesia: LOCAL

## 2022-09-22 MED ORDER — HEPARIN (PORCINE) IN NACL 1000-0.9 UT/500ML-% IV SOLN
INTRAVENOUS | Status: DC | PRN
  Administered 2022-09-22 (×2): 500 mL

## 2022-09-22 MED ORDER — SODIUM CHLORIDE 0.9% FLUSH: 3.0000 mL | INTRAVENOUS | Status: DC | PRN

## 2022-09-22 MED ORDER — SODIUM CHLORIDE 0.9 % WEIGHT BASED INFUSION
1.0000 mL/kg/h | INTRAVENOUS | Status: AC
  Administered 2022-09-22: 1 mL/kg/h via INTRAVENOUS

## 2022-09-22 MED ORDER — HEPARIN SODIUM (PORCINE) 1000 UNIT/ML IJ SOLN
INTRAMUSCULAR | Status: DC | PRN
  Administered 2022-09-22: 5000 [IU] via INTRAVENOUS

## 2022-09-22 MED ORDER — SODIUM CHLORIDE 0.9% FLUSH
3.0000 mL | Freq: Two times a day (BID) | INTRAVENOUS | Status: DC
  Administered 2022-09-23: 3 mL via INTRAVENOUS

## 2022-09-22 MED ORDER — ONDANSETRON HCL 4 MG/2ML IJ SOLN: 4.0000 mg | Freq: Four times a day (QID) | INTRAMUSCULAR | Status: DC | PRN

## 2022-09-22 MED ORDER — ACETAMINOPHEN 325 MG PO TABS
650.0000 mg | ORAL_TABLET | ORAL | Status: DC | PRN
  Administered 2022-09-22: 650 mg via ORAL
  Filled 2022-09-22: qty 2

## 2022-09-22 MED ORDER — FENTANYL CITRATE (PF) 100 MCG/2ML IJ SOLN
INTRAMUSCULAR | Status: DC | PRN
  Administered 2022-09-22: 25 ug via INTRAVENOUS

## 2022-09-22 MED ORDER — HYDRALAZINE HCL 20 MG/ML IJ SOLN: 10.0000 mg | INTRAMUSCULAR | Status: AC | PRN

## 2022-09-22 MED ORDER — CLOPIDOGREL BISULFATE 75 MG PO TABS
75.0000 mg | ORAL_TABLET | Freq: Every day | ORAL | Status: DC
  Administered 2022-09-23: 75 mg via ORAL
  Filled 2022-09-22: qty 1

## 2022-09-22 MED ORDER — LIDOCAINE HCL (PF) 1 % IJ SOLN
INTRAMUSCULAR | Status: AC
  Filled 2022-09-22: qty 30

## 2022-09-22 MED ORDER — FENTANYL CITRATE (PF) 100 MCG/2ML IJ SOLN
INTRAMUSCULAR | Status: AC
  Filled 2022-09-22: qty 2

## 2022-09-22 MED ORDER — MIDAZOLAM HCL 2 MG/2ML IJ SOLN
INTRAMUSCULAR | Status: DC | PRN
  Administered 2022-09-22: 2 mg via INTRAVENOUS

## 2022-09-22 MED ORDER — IOHEXOL 350 MG/ML SOLN
INTRAVENOUS | Status: DC | PRN
  Administered 2022-09-22: 55 mL

## 2022-09-22 MED ORDER — MIDAZOLAM HCL 2 MG/2ML IJ SOLN
INTRAMUSCULAR | Status: AC
  Filled 2022-09-22: qty 2

## 2022-09-22 MED ORDER — VERAPAMIL HCL 2.5 MG/ML IV SOLN
INTRAVENOUS | Status: DC | PRN
  Administered 2022-09-22: 10 mL via INTRA_ARTERIAL

## 2022-09-22 MED ORDER — SODIUM CHLORIDE 0.9 % IV SOLN: 250.0000 mL | INTRAVENOUS | Status: DC | PRN

## 2022-09-22 MED ORDER — LABETALOL HCL 5 MG/ML IV SOLN: 10.0000 mg | INTRAVENOUS | Status: AC | PRN

## 2022-09-22 MED ORDER — VERAPAMIL HCL 2.5 MG/ML IV SOLN
INTRAVENOUS | Status: AC
  Filled 2022-09-22: qty 2

## 2022-09-22 MED ORDER — CLOPIDOGREL BISULFATE 75 MG PO TABS
300.0000 mg | ORAL_TABLET | Freq: Once | ORAL | Status: AC
  Administered 2022-09-22: 300 mg via ORAL
  Filled 2022-09-22: qty 4

## 2022-09-22 MED ORDER — HEPARIN SODIUM (PORCINE) 1000 UNIT/ML IJ SOLN
INTRAMUSCULAR | Status: AC
  Filled 2022-09-22: qty 10

## 2022-09-22 MED ORDER — LIDOCAINE HCL (PF) 1 % IJ SOLN
INTRAMUSCULAR | Status: DC | PRN
  Administered 2022-09-22: 2 mL

## 2022-09-22 SURGICAL SUPPLY — 8 items
CATH 5FR JL3.5 JR4 ANG PIG MP (CATHETERS) IMPLANT
DEVICE RAD COMP TR BAND LRG (VASCULAR PRODUCTS) IMPLANT
GLIDESHEATH SLEND SS 6F .021 (SHEATH) IMPLANT
GUIDEWIRE INQWIRE 1.5J.035X260 (WIRE) IMPLANT
INQWIRE 1.5J .035X260CM (WIRE) ×1
KIT SYRINGE INJ CVI SPIKEX1 (MISCELLANEOUS) IMPLANT
PACK CARDIAC CATHETERIZATION (CUSTOM PROCEDURE TRAY) ×1 IMPLANT
SET ATX-X65L (MISCELLANEOUS) IMPLANT

## 2022-09-22 NOTE — Progress Notes (Addendum)
Rounding Note    Patient Name: Jonathan Holden Date of Encounter: 09/22/2022  [facility] Cardiologist: Kristeen Miss, MD   Subjective   No further chest pain. Afib is very infrequent  Inpatient Medications    Scheduled Meds:  amiodarone  200 mg Oral Daily   aspirin EC  81 mg Oral Daily   atorvastatin  40 mg Oral Daily   lisinopril  20 mg Oral Daily   loratadine  10 mg Oral Daily   metoprolol succinate  50 mg Oral Daily   pantoprazole  40 mg Oral Daily   Continuous Infusions:  sodium chloride 1 mL/kg/hr (09/22/22 0624)   heparin 1,400 Units/hr (09/21/22 1910)   PRN Meds: acetaminophen, hydrALAZINE, neomycin-bacitracin-polymyxin, neomycin-polymyxin-pramoxine, nitroGLYCERIN, ondansetron (ZOFRAN) IV   Vital Signs    Vitals:   09/21/22 1159 09/21/22 1603 09/22/22 0551 09/22/22 0722  BP: 116/71 123/81 137/72 133/77  Pulse: 66 62 63 63  Resp: 16 18 16    Temp: 97.7 F (36.5 C) 98.2 F (36.8 C) 98.4 F (36.9 C) 97.6 F (36.4 C)  TempSrc: Oral Axillary Oral Oral  SpO2: 94% 95% 90% 94%  Weight:      Height:        Intake/Output Summary (Last 24 hours) at 09/22/2022 0806 Last data filed at 09/22/2022 1610 Gross per 24 hour  Intake 1151.8 ml  Output 950 ml  Net 201.8 ml      09/19/2022    2:42 PM 09/19/2022    8:02 AM 07/30/2022    8:00 AM  Last 3 Weights  Weight (lbs) 217 lb 217 lb 221 lb 3.2 oz  Weight (kg) 98.431 kg 98.431 kg 100.336 kg      Telemetry    Sinus rhythm in the 60s - Personally Reviewed  ECG    No new tracings - Personally Reviewed  Physical Exam   GEN: No acute distress.   Neck: No JVD Cardiac: RRR, no murmurs, rubs, or gallops.  Respiratory: Clear to auscultation bilaterally. GI: Soft, nontender, non-distended  MS: No edema; No deformity. Neuro:  Nonfocal  Psych: Normal affect   Labs    High Sensitivity Troponin:   Recent Labs  Lab 09/19/22 0819 09/19/22 1004 09/20/22 1219 09/20/22 1504  TROPONINIHS 205* 255*  1,552* 1,551*     Chemistry Recent Labs  Lab 09/19/22 1622 09/20/22 0500 09/21/22 0242 09/22/22 0438  NA 135 137 136 137  K 3.7 3.7 3.9 4.0  CL 101 103 102 104  CO2 24 22 25 22   GLUCOSE 94 99 93 95  BUN 11 19 18 20   CREATININE 1.00 1.08 1.21 1.44*  CALCIUM 9.2 9.5 9.3 9.2  PROT 6.8  --   --   --   ALBUMIN 3.6  --   --   --   AST 23  --   --   --   ALT 17  --   --   --   ALKPHOS 61  --   --   --   BILITOT 1.0  --   --   --   GFRNONAA >60 >60 >60 50*  ANIONGAP 10 12 9 11     Lipids  Recent Labs  Lab 09/20/22 0500  CHOL 247*  TRIG 359*  HDL 38*  LDLCALC 137*  CHOLHDL 6.5    Hematology Recent Labs  Lab 09/20/22 0500 09/21/22 0242 09/22/22 0438  WBC 6.2 6.4 5.2  RBC 4.23 4.45 4.12*  HGB 14.2 15.1 14.2  HCT 41.2 45.6 41.9  MCV 97.4 102.5* 101.7*  MCH 33.6 33.9 34.5*  MCHC 34.5 33.1 33.9  RDW 13.3 13.5 13.5  PLT 154 190 155   Thyroid No results for input(s): "TSH", "FREET4" in the last 168 hours.  BNP Recent Labs  Lab 09/19/22 0819  BNP 130.8*    DDimer No results for input(s): "DDIMER" in the last 168 hours.   Radiology    ECHOCARDIOGRAM COMPLETE  Result Date: 09/21/2022    ECHOCARDIOGRAM REPORT   Patient Name:   MAYES SCHUDEL Date of Exam: 09/21/2022 Medical Rec #:  [id number]        Height:       74.0 in Accession #:    [id number]       Weight:       217.0 lb Date of Birth:  12-18-1945        BSA:          2.250 m Patient Age:    77 years         BP:           148/77 mmHg Patient Gender: M                HR:           82 bpm. Exam Location:  Inpatient Procedure: 2D Echo, Cardiac Doppler, Color Doppler and Intracardiac            Opacification Agent Indications:    NSTEMI I21.4  History:        Patient has prior history of Echocardiogram examinations, most                 recent 03/11/2016. CHF, CAD and Previous Myocardial Infarction,                 Arrythmias:Atrial Fibrillation; Risk Factors:Hypertension and                 Dyslipidemia.  Sonographer:     Celesta Gentile RCS Referring Phys: [phone] DAYNA N DUNN IMPRESSIONS  1. Left ventricular ejection fraction, by estimation, is 50 to 55%. The left ventricle has low normal function. The left ventricle demonstrates regional wall motion abnormalities (see scoring diagram/findings for description). There is moderate asymmetric left ventricular hypertrophy of the septal segment. Left ventricular diastolic parameters are consistent with Grade I diastolic dysfunction (impaired relaxation).  2. Right ventricular systolic function is normal. The right ventricular size is normal. Mildly increased right ventricular wall thickness. Tricuspid regurgitation signal is inadequate for assessing PA pressure.  3. The mitral valve is grossly normal. Trivial mitral valve regurgitation.  4. The aortic valve is tricuspid. There is moderate calcification of the aortic valve. Aortic valve regurgitation is mild, peripherally at 6 o'clock in short axis. Aortic valve sclerosis/calcification is present, without any evidence of aortic stenosis.  5. The inferior vena cava is normal in size with greater than 50% respiratory variability, suggesting right atrial pressure of 3 mmHg. Comparison(s): Prior images unable to be directly viewed. FINDINGS  Left Ventricle: Left ventricular ejection fraction, by estimation, is 50 to 55%. The left ventricle has low normal function. The left ventricle demonstrates regional wall motion abnormalities. Definity contrast agent was given IV to delineate the left ventricular endocardial borders. The left ventricular internal cavity size was normal in size. There is moderate asymmetric left ventricular hypertrophy of the septal segment. Left ventricular diastolic parameters are consistent with Grade I diastolic dysfunction (impaired relaxation).  LV Wall Scoring: The basal inferior segment and basal inferoseptal segment are  hypokinetic. The entire anterior wall, entire lateral wall, entire anterior septum, entire apex,  mid and distal inferior wall, and mid inferoseptal segment are normal. Right Ventricle: The right ventricular size is normal. Mildly increased right ventricular wall thickness. Right ventricular systolic function is normal. Tricuspid regurgitation signal is inadequate for assessing PA pressure. Left Atrium: Left atrial size was normal in size. Right Atrium: Right atrial size was normal in size. Pericardium: There is no evidence of pericardial effusion. Mitral Valve: The mitral valve is grossly normal. Mild mitral annular calcification. Trivial mitral valve regurgitation. Tricuspid Valve: The tricuspid valve is grossly normal. Tricuspid valve regurgitation is trivial. Aortic Valve: The aortic valve is tricuspid. There is moderate calcification of the aortic valve. There is mild aortic valve annular calcification. Aortic valve regurgitation is mild. Aortic valve sclerosis/calcification is present, without any evidence of aortic stenosis. Pulmonic Valve: The pulmonic valve was grossly normal. Pulmonic valve regurgitation is trivial. Aorta: The aortic root is normal in size and structure. Venous: The inferior vena cava is normal in size with greater than 50% respiratory variability, suggesting right atrial pressure of 3 mmHg. IAS/Shunts: No atrial level shunt detected by color flow Doppler.  LEFT VENTRICLE PLAX 2D LVIDd:         5.00 cm   Diastology LVIDs:         3.60 cm   LV e' medial:    5.00 cm/s LV PW:         1.20 cm   LV E/e' medial:  8.2 LV IVS:        1.40 cm   LV e' lateral:   5.98 cm/s LVOT diam:     2.20 cm   LV E/e' lateral: 6.9 LV SV:         86 LV SV Index:   38 LVOT Area:     3.80 cm  RIGHT VENTRICLE RV S prime:     12.00 cm/s TAPSE (M-mode): 2.0 cm LEFT ATRIUM             Index        RIGHT ATRIUM           Index LA diam:        2.90 cm 1.29 cm/m   RA Area:     16.40 cm LA Vol (A2C):   77.8 ml 34.58 ml/m  RA Volume:   41.40 ml  18.40 ml/m LA Vol (A4C):   56.0 ml 24.89 ml/m LA Biplane Vol: 66.1 ml  29.38 ml/m  AORTIC VALVE LVOT Vmax:   88.30 cm/s LVOT Vmean:  53.000 cm/s LVOT VTI:    0.227 m  AORTA Ao Root diam: 3.60 cm MITRAL VALVE MV Area (PHT): 2.69 cm    SHUNTS MV Decel Time: 282 msec    Systemic VTI:  0.23 m MV E velocity: 41.00 cm/s  Systemic Diam: 2.20 cm MV A velocity: 76.50 cm/s MV E/A ratio:  0.54 Nona Dell MD Electronically signed by Nona Dell MD Signature Date/Time: 09/21/2022/3:26:16 PM    Final     Cardiac Studies   LHC today  Echo 09/21/22:  1. Left ventricular ejection fraction, by estimation, is 50 to 55%. The  left ventricle has low normal function. The left ventricle demonstrates  regional wall motion abnormalities (see scoring diagram/findings for  description). There is moderate  asymmetric left ventricular hypertrophy of the septal segment. Left  ventricular diastolic parameters are consistent with Grade I diastolic  dysfunction (impaired relaxation).   2. Right ventricular systolic  function is normal. The right ventricular  size is normal. Mildly increased right ventricular wall thickness.  Tricuspid regurgitation signal is inadequate for assessing PA pressure.   3. The mitral valve is grossly normal. Trivial mitral valve  regurgitation.   4. The aortic valve is tricuspid. There is moderate calcification of the  aortic valve. Aortic valve regurgitation is mild, peripherally at 6  o'clock in short axis. Aortic valve sclerosis/calcification is present,  without any evidence of aortic stenosis.   5. The inferior vena cava is normal in size with greater than 50%  respiratory variability, suggesting right atrial pressure of 3 mmHg.      Patient Profile     77 y.o. male with a history of HTN, PAF, GERD, and CAD presented with hypertensive urgency now hospitalized with NSTEMI awaiting heart cath today. CP initially relieved with protonix, but CE elevated to greater than 1500.  Assessment & Plan    NSTEMI - hs troponin 205 --> 255 --> 1552 -->  1551 - CP initially responded to protonix and felt noncardiac, however given CE will proceed with heart catheterization today - echo with RWMA, preserved EF - heart cath today - continue lipitor 40 mg and BB   PAF - currently on amiodarone - not anticoagulated given hx of GI bleed - can receive brilinta   Hypertension Hypertensive urgency PTA: 10 mg lisinopril, 25 mg toprol - BP on presentation 170/100 - ACEI on  hold for sCr bump   AKI sCr 1.44 today, holding lisinopril, running IVF prior to cath   GERD - OTC prilosec has been recommended with OP GI evaluation   Unprovoked DVTs, chronic DVT PAF - suffered GI bleed while on OAC - has seen [facility] for evaluation of IVC filter    For questions or updates, please contact [facility] Please consult [email or web address] for contact info under        Signed, Marcelino Duster, PA  09/22/2022, 8:06 AM    I have examined the patient and reviewed assessment and plan and discussed with patient.  Agree with above as stated.  Right radial site stable post cath.  Cath films reviewed.  Occluded ramus vessel.  Medical therapy.  Plavix initiated.  Plan for discharge tomorrow.    Lance Muss

## 2022-09-22 NOTE — Interval H&P Note (Signed)
Cath Lab Visit (complete for each Cath Lab visit)  Clinical Evaluation Leading to the Procedure:   ACS: Yes.    Non-ACS:    Anginal Classification: CCS IV  Anti-ischemic medical therapy: Minimal Therapy (1 class of medications)  Non-Invasive Test Results: No non-invasive testing performed  Prior CABG: No previous CABG      History and Physical Interval Note:  09/22/2022 9:34 AM  Jonathan Holden  has presented today for surgery, with the diagnosis of NSTEMI.  The various methods of treatment have been discussed with the patient and family. After consideration of risks, benefits and other options for treatment, the patient has consented to  Procedure(s): LEFT HEART CATH AND CORONARY ANGIOGRAPHY (N/A) as a surgical intervention.  The patient's history has been reviewed, patient examined, no change in status, stable for surgery.  I have reviewed the patient's chart and labs.  Questions were answered to the patient's satisfaction.     Tonny Bollman

## 2022-09-22 NOTE — Progress Notes (Signed)
Mobility Specialist Progress Note:   09/22/22 1515  Mobility  Activity Ambulated with assistance in hallway  Level of Assistance Contact guard assist, steadying assist  Assistive Device Other (Comment) (IV POle)  Distance Ambulated (ft) 500 ft  Activity Response Tolerated well  Mobility Referral Yes  $Mobility charge 1 Mobility  Mobility Specialist Start Time (ACUTE ONLY) 1500  Mobility Specialist Stop Time (ACUTE ONLY) 1514  Mobility Specialist Time Calculation (min) (ACUTE ONLY) 14 min   Received pt in bed having no complaints and agreeable to mobility. Pt was asymptomatic throughout ambulation and returned to room w/o fault. Left in bed w/ call bell in reach and all needs met.   Thompson Grayer Mobility Specialist  Please contact vis Secure Chat or  Rehab Office [phone]

## 2022-09-23 ENCOUNTER — Other Ambulatory Visit (HOSPITAL_COMMUNITY): Payer: Self-pay | Admitting: Cardiology

## 2022-09-23 DIAGNOSIS — Z006 Encounter for examination for normal comparison and control in clinical research program: Secondary | ICD-10-CM

## 2022-09-23 DIAGNOSIS — E785 Hyperlipidemia, unspecified: Secondary | ICD-10-CM | POA: Diagnosis not present

## 2022-09-23 DIAGNOSIS — I480 Paroxysmal atrial fibrillation: Secondary | ICD-10-CM | POA: Diagnosis not present

## 2022-09-23 DIAGNOSIS — I10 Essential (primary) hypertension: Secondary | ICD-10-CM | POA: Diagnosis not present

## 2022-09-23 DIAGNOSIS — I214 Non-ST elevation (NSTEMI) myocardial infarction: Secondary | ICD-10-CM | POA: Diagnosis not present

## 2022-09-23 LAB — BASIC METABOLIC PANEL
Anion gap: 9 (ref 5–15)
BUN: 15 mg/dL (ref 8–23)
CO2: 19 mmol/L — ABNORMAL LOW (ref 22–32)
Calcium: 9.1 mg/dL (ref 8.9–10.3)
Chloride: 110 mmol/L (ref 98–111)
Creatinine, Ser: 0.96 mg/dL (ref 0.61–1.24)
GFR, Estimated: >60 mL/min (ref >60)
Glucose, Bld: 99 mg/dL (ref 70–99)
Potassium: 3.8 mmol/L (ref 3.5–5.1)
Sodium: 138 mmol/L (ref 135–145)

## 2022-09-23 LAB — CBC
HCT: 42.6 % (ref 39.0–52.0)
Hemoglobin: 14.2 g/dL (ref 13.0–17.0)
MCH: 34 pg (ref 26.0–34.0)
MCHC: 33.3 g/dL (ref 30.0–36.0)
MCV: 101.9 fL — ABNORMAL HIGH (ref 80.0–100.0)
Platelets: 148 10*3/uL — ABNORMAL LOW (ref 150–400)
RBC: 4.18 MIL/uL — ABNORMAL LOW (ref 4.22–5.81)
RDW: 13.5 % (ref 11.5–15.5)
WBC: 5.2 10*3/uL (ref 4.0–10.5)
nRBC: 0 % (ref 0.0–0.2)

## 2022-09-23 MED ORDER — STUDY - SOS-AMI - SELATOGREL 16 MG/0.5 ML OR PLACEBO SQ INJECTION (PI-CHRISTOPHER): 16.0000 mg | INJECTION | Freq: Once | SUBCUTANEOUS | 0 refills | Status: AC

## 2022-09-23 MED ORDER — NITROGLYCERIN 0.4 MG SL SUBL: 0.4000 mg | SUBLINGUAL_TABLET | SUBLINGUAL | 3 refills | Status: AC | PRN

## 2022-09-23 MED ORDER — STUDY - SOS-AMI - PLACEBO FOR SELATOGREL 0 MG/0.5 ML SQ INJECTION FOR SCREENING USE ONLY (PI-CHRISTOPHER): 0.5000 mL | INJECTION | Freq: Once | SUBCUTANEOUS | 0 refills | Status: AC

## 2022-09-23 MED ORDER — ATORVASTATIN CALCIUM 40 MG PO TABS: 40.0000 mg | ORAL_TABLET | Freq: Every day | ORAL | 3 refills | Status: DC

## 2022-09-23 MED ORDER — CLOPIDOGREL BISULFATE 75 MG PO TABS: 75.0000 mg | ORAL_TABLET | Freq: Every day | ORAL | 3 refills | Status: DC

## 2022-09-23 MED ORDER — METOPROLOL SUCCINATE ER 50 MG PO TB24: 50.0000 mg | ORAL_TABLET | Freq: Every day | ORAL | 3 refills | Status: DC

## 2022-09-23 MED ORDER — ASPIRIN 81 MG PO TBEC: 81.0000 mg | DELAYED_RELEASE_TABLET | Freq: Every day | ORAL | 12 refills | Status: DC

## 2022-09-23 MED ORDER — STUDY - SOS-AMI - PLACEBO FOR SELATOGREL 0 MG/0.5 ML SQ INJECTION FOR SCREENING USE ONLY (PI-CHRISTOPHER)
0.5000 mL | INJECTION | Freq: Once | SUBCUTANEOUS | Status: AC
  Administered 2022-09-23: 0.5 mL via SUBCUTANEOUS
  Filled 2022-09-23: qty 0.5

## 2022-09-23 NOTE — Plan of Care (Signed)
Problem: Education: Goal: Understanding of cardiac disease, CV risk reduction, and recovery process will improve 09/23/2022 1031 by Herma Carson, RN Outcome: Adequate for Discharge 09/23/2022 0731 by Herma Carson, RN Outcome: Progressing Goal: Individualized Educational Video(s) 09/23/2022 1031 by Herma Carson, RN Outcome: Adequate for Discharge 09/23/2022 0731 by Herma Carson, RN Outcome: Progressing   Problem: Activity: Goal: Ability to tolerate increased activity will improve 09/23/2022 1031 by Herma Carson, RN Outcome: Adequate for Discharge 09/23/2022 0731 by Herma Carson, RN Outcome: Progressing   Problem: Cardiac: Goal: Ability to achieve and maintain adequate cardiovascular perfusion will improve 09/23/2022 1031 by Herma Carson, RN Outcome: Adequate for Discharge 09/23/2022 0731 by Herma Carson, RN Outcome: Progressing   Problem: Health Behavior/Discharge Planning: Goal: Ability to safely manage health-related needs after discharge will improve 09/23/2022 1031 by Herma Carson, RN Outcome: Adequate for Discharge 09/23/2022 0731 by Herma Carson, RN Outcome: Progressing   Problem: Education: Goal: Knowledge of General Education information will improve Description: Including pain rating scale, medication(s)/side effects and non-pharmacologic comfort measures 09/23/2022 1031 by Herma Carson, RN Outcome: Adequate for Discharge 09/23/2022 0731 by Herma Carson, RN Outcome: Progressing   Problem: Health Behavior/Discharge Planning: Goal: Ability to manage health-related needs will improve 09/23/2022 1031 by Herma Carson, RN Outcome: Adequate for Discharge 09/23/2022 0731 by Herma Carson, RN Outcome: Progressing   Problem: Clinical Measurements: Goal: Ability to maintain clinical measurements within normal limits will improve 09/23/2022 1031 by Herma Carson, RN Outcome: Adequate for Discharge 09/23/2022 0731 by Herma Carson, RN Outcome:  Progressing Goal: Will remain free from infection 09/23/2022 1031 by Herma Carson, RN Outcome: Adequate for Discharge 09/23/2022 0731 by Herma Carson, RN Outcome: Progressing Goal: Diagnostic test results will improve 09/23/2022 1031 by Herma Carson, RN Outcome: Adequate for Discharge 09/23/2022 0731 by Herma Carson, RN Outcome: Progressing Goal: Respiratory complications will improve 09/23/2022 1031 by Herma Carson, RN Outcome: Adequate for Discharge 09/23/2022 0731 by Herma Carson, RN Outcome: Progressing Goal: Cardiovascular complication will be avoided 09/23/2022 1031 by Herma Carson, RN Outcome: Adequate for Discharge 09/23/2022 0731 by Herma Carson, RN Outcome: Progressing   Problem: Activity: Goal: Risk for activity intolerance will decrease 09/23/2022 1031 by Herma Carson, RN Outcome: Adequate for Discharge 09/23/2022 0731 by Herma Carson, RN Outcome: Progressing   Problem: Nutrition: Goal: Adequate nutrition will be maintained 09/23/2022 1031 by Herma Carson, RN Outcome: Adequate for Discharge 09/23/2022 0731 by Herma Carson, RN Outcome: Progressing   Problem: Coping: Goal: Level of anxiety will decrease 09/23/2022 1031 by Herma Carson, RN Outcome: Adequate for Discharge 09/23/2022 0731 by Herma Carson, RN Outcome: Progressing   Problem: Elimination: Goal: Will not experience complications related to bowel motility 09/23/2022 1031 by Herma Carson, RN Outcome: Adequate for Discharge 09/23/2022 0731 by Herma Carson, RN Outcome: Progressing Goal: Will not experience complications related to urinary retention 09/23/2022 1031 by Herma Carson, RN Outcome: Adequate for Discharge 09/23/2022 0731 by Herma Carson, RN Outcome: Progressing   Problem: Pain Managment: Goal: General experience of comfort will improve 09/23/2022 1031 by Herma Carson, RN Outcome: Adequate for Discharge 09/23/2022 0731 by Herma Carson, RN Outcome: Progressing    Problem: Safety: Goal: Ability to remain free from injury will improve 09/23/2022 1031 by Herma Carson, RN Outcome: Adequate for Discharge 09/23/2022 0731 by Herma Carson, RN Outcome: Progressing  Problem: Skin Integrity: Goal: Risk for impaired skin integrity will decrease 09/23/2022 1031 by Herma Carson, RN Outcome: Adequate for Discharge 09/23/2022 0731 by Herma Carson, RN Outcome: Progressing   Problem: Education: Goal: Understanding of CV disease, CV risk reduction, and recovery process will improve 09/23/2022 1031 by Herma Carson, RN Outcome: Adequate for Discharge 09/23/2022 0731 by Herma Carson, RN Outcome: Progressing Goal: Individualized Educational Video(s) 09/23/2022 1031 by Herma Carson, RN Outcome: Adequate for Discharge 09/23/2022 0731 by Herma Carson, RN Outcome: Progressing   Problem: Activity: Goal: Ability to return to baseline activity level will improve 09/23/2022 1031 by Herma Carson, RN Outcome: Adequate for Discharge 09/23/2022 0731 by Herma Carson, RN Outcome: Progressing   Problem: Cardiovascular: Goal: Ability to achieve and maintain adequate cardiovascular perfusion will improve 09/23/2022 1031 by Herma Carson, RN Outcome: Adequate for Discharge 09/23/2022 0731 by Herma Carson, RN Outcome: Progressing Goal: Vascular access site(s) Level 0-1 will be maintained 09/23/2022 1031 by Herma Carson, RN Outcome: Adequate for Discharge 09/23/2022 0731 by Herma Carson, RN Outcome: Progressing   Problem: Health Behavior/Discharge Planning: Goal: Ability to safely manage health-related needs after discharge will improve 09/23/2022 1031 by Herma Carson, RN Outcome: Adequate for Discharge 09/23/2022 0731 by Herma Carson, RN Outcome: Progressing

## 2022-09-23 NOTE — Progress Notes (Signed)
CARDIAC REHAB PHASE I   PRE:  Rate/Rhythm: 67 NSR  BP:  Sitting: 134/84      SaO2: 97 RA  MODE:  Ambulation: 470 ft   AD:   no AD  POST:  Rate/Rhythm: 85 NSR  BP:  Sitting: 152/84      SaO2: 97 RA  Pt amb with supervision assistance, pt denies CP and SOB during amb and was returned to room w/o complaint.   Pt was educated on wt restrictions, no baths/daily wash-ups, s/s of infection, ex guidelines, s/s to stop exercising, NTG use and calling 911, heart healthy diet, risk factors and CRPII. Pt received materials on exercise, diet, and CRPII. Will refer to [facility].    Faustino Congress  ACSM-CEP 9:17 AM 09/23/2022    Service time is from 0854 to [phone].

## 2022-09-23 NOTE — Research (Addendum)
ID- [id number],  [facility]  Subject ID: [id number] Trainer's name: Dutch Quint Trainer's signature:  on file- [employer] Log Date of visit:    23-Sep-2022   [facility] Study: Screening  Demographics: Listed in Epic   Baseline Risk Factors:     Prior AMI(s) (excluding index MI) within 1 year of screening [x]   No  []   Yes   Prior AMI(s) (excluding index MI) more than 1 year before screening []   No  [x]   Yes   Diabetes mellitus defined by ongoing glucose lowering treatment [x]   No  []  Yes   Chronic kidney disease with estimated glomerular filtration               Rate <60 mL/min/1.73 m2                                         [x]   No  []   Yes    PAD defined as any of the following:  Ankle/brachial index < 0.85             Amputation, peripheral bypass, or peripheral angioplasty            of the extremities secondary to ischemia.         [x]   No   []   Yes    Absence of coronary revascularization of the qualifying AMI          Referred to in inclusion criterion                                          []  No  [x]   Yes  Active daily smoking at screening      [x]  No  []   Yes   P1. Qualifying AMI Characteristics     1.1  Diagnosis Date 19-Sep-2022      1.2  Diagnosis    []  STEMI  [x]   NSTEMI  []   Other           If other, please specify ___________________________________________      1.3  [place] class  []  I []  II []  III  []  IV  [x]  UNK      1.4  Were revascularization procedures performed?    [x]   No  []   Yes      1.5  Peak cTn value     1552     1.6  Type    []  Troponin T  []  Troponin I                         []  High Sensitivity Troponin T    []   High Sensitivity Troponin I   []  Unknown    1.7  Units  []  g/L   [x]  ng/L  []  ng/mL  []  ug/L []  Unknown                 Other, please specify ______________    1.8 Upper Range Limit   18     1.9  Date of discharge:   23-Sep-2022  2. Medications at discharge    2.1   Organic Nitrates  []   No   [x]   Yes    2.2  Beta-blocker   []   No  [x]   Yes    2.3  ACE inhibitor  []   No  [x]   Yes    2.4  Angiotensin II receptor blocker  [x]   No  []   Yes    2.5  Calcium channel blocker  [x]   No  []   Yes    2.6  Aldosterone receptor blocker [x]   No  []   Yes    2.7  Statin   []   No  [x]   Yes    2.8  PCSK9 inhibitors []   No  [x]   Yes   Current Outpatient Medications:    amiodarone (PACERONE) 200 MG tablet, TAKE 1 TABLET (200 MG TOTAL) BY MOUTH DAILY. \, Disp: 90 tablet, Rfl: 3   [START ON 09/24/2022] aspirin EC 81 MG tablet, Take 1 tablet (81 mg total) by mouth daily. Swallow whole., Disp: 30 tablet, Rfl: 12   [START ON 09/24/2022] atorvastatin (LIPITOR) 40 MG tablet, Take 1 tablet (40 mg total) by mouth daily., Disp: 90 tablet, Rfl: 3   Carboxymethylcellul-Glycerin (LUBRICATING EYE DROPS OP), Place 1 drop into both eyes daily as needed (dry eyes)., Disp: , Rfl:    clobetasol ointment (TEMOVATE) 0.05 %, APPLY TO AFFECTED AREA TWICE A DAY, Disp: 30 g, Rfl: 2   [START ON 09/24/2022] clopidogrel (PLAVIX) 75 MG tablet, Take 1 tablet (75 mg total) by mouth daily., Disp: 90 tablet, Rfl: 3   Evolocumab (REPATHA SURECLICK) 140 MG/ML SOAJ, Inject 140 mg into the skin every 14 (fourteen) days., Disp: 6 mL, Rfl: 3   ketoconazole (NIZORAL) 2 % shampoo, APPLY 1 TO 3 TIMES A WEEK TO SCALP. LET SIT 10 MIN THEN RINSE OUT. STRENGTH: 2 % (Patient taking differently: Apply 1 Application topically 3 (three) times a week. APPLY 1 TO 3 TIMES A WEEK TO SCALP. LET SIT 10 MIN THEN RINSE OUT. STRENGTH: 2 %), Disp: 120 mL, Rfl: 3   lisinopril (ZESTRIL) 10 MG tablet, TAKE 1 TABLET BY MOUTH DAILY. PLEASE KEEP UPCOMING APPOINTMENT WITH DR. Katrinka Blazing IN APRIL 2023, Disp: 90 tablet, Rfl: 3   [START ON 09/24/2022] metoprolol succinate (TOPROL-XL) 50 MG 24 hr tablet, Take 1 tablet (50 mg total) by mouth daily. Take with or immediately following a meal., Disp: 90 tablet, Rfl: 3   nitroGLYCERIN (NITROSTAT) 0.4 MG SL tablet, Place 1 tablet (0.4 mg  total) under the tongue every 5 (five) minutes x 3 doses as needed for chest pain., Disp: 25 tablet, Rfl: 3   Study - [facility] - placebo for selatogrel 0 mg/0.5 mL SQ injection for screening use only (PI-Christopher), Inject 0.5 mLs into the skin once for 1 dose., Disp: 0.5 mL, Rfl: 0   Study - [facility] - selatogrel 16 mg/0.5 mL or placebo SQ injection (PI-Christopher), Inject 16 mg into the skin once. For Investigational Use Only. Inject 0.5 mL subcutaneously in the abdomen or thigh if you experience symptoms of a heart attack. Call 911 immediately and seek emergency medical. Please contact [facility] for any questions or concerns regarding this medication., Disp: , Rfl:    Study - [facility] - selatogrel 16 mg/0.5 mL or placebo SQ injection (PI-Christopher), Inject 0.5 mLs (16 mg total) into the skin once for 1 dose., Disp: 1 mL, Rfl: 0   valACYclovir (VALTREX) 1000 MG tablet, Take 2 tablets by mouth daily. If having a flare up, pt will take an additional tablet., Disp: , Rfl:  No current facility-administered medications for this visit.  Facility-Administered Medications Ordered in Other Visits:    0.9 %  sodium chloride infusion, 250 mL, Intravenous, PRN, Tonny Bollman, MD   acetaminophen (TYLENOL) tablet 650 mg, 650 mg, Oral, Q4H PRN, Tonny Bollman, MD, 650 mg at 09/22/22 2026   amiodarone (PACERONE) tablet 200 mg, 200 mg, Oral, Daily, Tonny Bollman, MD, 200 mg at 09/23/22 0845   aspirin EC tablet 81 mg, 81 mg, Oral, Daily, Tonny Bollman, MD, 81 mg at 09/23/22 0845   atorvastatin (LIPITOR) tablet 40 mg, 40 mg, Oral, Daily, Tonny Bollman, MD, 40 mg at 09/23/22 0845   clopidogrel (PLAVIX) tablet 75 mg, 75 mg, Oral, Daily, Tonny Bollman, MD, 75 mg at 09/23/22 0845   hydrALAZINE (APRESOLINE) injection 10 mg, 10 mg, Intravenous, Q4H PRN, Tonny Bollman, MD, 10 mg at 09/19/22 1652   loratadine (CLARITIN) tablet 10 mg, 10 mg, Oral, Daily, Tonny Bollman, MD, 10 mg at 09/23/22  0519   metoprolol succinate (TOPROL-XL) 24 hr tablet 50 mg, 50 mg, Oral, Daily, Tonny Bollman, MD, 50 mg at 09/23/22 0845   neomycin-bacitracin-polymyxin (NEOSPORIN) ointment, , Topical, BID PRN, Tonny Bollman, MD, Given at 09/21/22 2226   nitroGLYCERIN (NITROSTAT) SL tablet 0.4 mg, 0.4 mg, Sublingual, Q5 Min x 3 PRN, Tonny Bollman, MD   ondansetron [facility]) injection 4 mg, 4 mg, Intravenous, Q6H PRN, Tonny Bollman, MD   pantoprazole (PROTONIX) EC tablet 40 mg, 40 mg, Oral, Daily, Tonny Bollman, MD, 40 mg at 09/23/22 0845   sodium chloride flush (NS) 0.9 % injection 3 mL, 3 mL, Intravenous, Q12H, Tonny Bollman, MD, 3 mL at 09/23/22 0847   sodium chloride flush (NS) 0.9 % injection 3 mL, 3 mL, Intravenous, PRN, Tonny Bollman, MD      [facility] ELIGIBILITY:  INCLUSION / EXCLUSION CRITERIA  Inclusion Criteria:  1. Signed and dated informed consent  []   No   [x]   Yes  20. >/= 78 years old (or age of majority in local region). []   No  [x]   Yes  3.Confirmed diagnosis of symptomatic type I AMI no longer than 4 weeks prior to randomization  []  No[x]   Yes  4. Diagnosis of multivessel coronary artery disease defined as  >/= stenosis on 2 or more coronary artery territories or on the left main artery during a prior cardiac catheterization or  Cardiac catheterization during the qualifying AMI event and          Or at least 2 of the following:       A.  Second  prior AMI []   No  [x]   Yes     B.  Diabetes Mellitus defined by ongoing glucose lowering treatment []   No  []   Yes     C.  Chronic Kidney Disease    [x]   No  []   Yes     D.  Peripheral Artery Disease  [x]   No  []   Yes     E.  Age >/= 65 years  []   No  [x]   yes     F. Absence of coronary revascularization of the qualifying AMI.  []   No  [x]  Yes     5.  Subject having successfully self-administered placebo during screening. []   No  [x]   Yes  6.  Women of childbearing potential who fulfill the following criteria:       -  Negative pregnancy test (Urine or Serum) at randomization    [x]   No  []   Yes      -  Agreement to use an acceptable contraceptive method. [x]   No  []   Yes  Exclusion Criteria:  1.  Increased risk of serious bleeding including any of the following:       A. History of intracranial bleed.  [x]   No  []   Yes     B. Known uncorrected intracranial vascular abnormality [x]   No  []   Yes     C. Gastrointestinal bleed requiring hospitalization or transfusion            Within 1 year prior to screening  [x]   No  []   Yes     D. Subjects on oral triple antithrombotic therapy [x]   No  []   Yes     E. Known liver impairment significantly affecting hepatic function  [x]   No  []   Yes     F. Current dialysis  [x]   No  []   Yes     G. Ischemic stroke or transient ischemic attack within 3 months of screening [x]  No  []  Yes  2. Chronic anemia with hemoglobin <10 g/dL [x]   No  []   Yes  3. Chronic thrombocytopenia with platelet count <100,000 /mm3.      [x]   No  []   Yes  4. Concomitant diseases or conditions that in the opinion of the investigator are           not compatible with study participation.   [x]   No  []  Yes   5. Known hypersensitivity to selatogrel,m any of its excipients, or drugs           of the P2Y12 class.  [x]   No  []   Yes  6. Previous exposure to an investigational drug within 3 months            prior to randomization     [x]   No  []   Yes   7. Participation in another clinical trial with an investigational product     or device within 3 months prior to randomization    [x]   No   []   Yes  8.  Pregnant, planning to become pregnant, or lactating women  [x]   No  []   Yes  9.  Known concomitant life-threatening disease with a                       life expectancy <12 months         [x]   No  []   Yes   DID SUBJECT MEET ALL THE ELIGIBILITY CRITERIA?  []  NO   [x]  YES  If NO, please list Inclusion/Exclusion criteria number(s) not met________________  INCLUSION / EXCLUSION CRITERIA REVIEWED  BY:     []  Dr. Jodelle Red         [x]  Dr. Shawnie Pons The patient has been approved for enrollment.         Was Subject randomized:   [x]  Yes  []  No        If No, select reason:   []  Not eligible as per inclusion/exclusion criteria     []  Withdrawal by subject     []   Withdrawal by parent / guardian     []  Adverse event      []  Lost to follow-up     []  Death     []  Other: ___________________    2.  Randomization:   2.1 Randomization Date: 23-Sep-2022  2.2 Randomization Number [id number]  2.3 Stratification 1     []   Background oral  P2Y12 receptor antagonist: None    [x]   Background oral P2Y12 receptor antagonist: Clopidogrel    []   Background oral P2Y12 receptor antagonist: Prasugrel    []   Background oral P2Y12 receptor antagonist: Ticagrelor                    SCREENING: PATIENT TRAINING- DEMONSTRATION DEVICE   Was the training delivered?   []  NO  [x]  YES        1.1 Date of training: 23-Sep-2022     1.2 Start time: 1000  1.3 Stop time: 1015     Difficulties in using the autoinjector for the DEMONSTRATION DEVICE self-injection:  3. Were there any difficulties in performing the placebo self-injection? [x]  NO  []  YES   IF NO, DO NOT ANSWER THE FOLLOWING QUESTIONS  Difficulties with Step1: Choose an injection site?  3.1.1. Was the injection site as defined in the protocol (abdomen/thigh)? []  NO  []   YES 3.1.2 Was the injection done on bare skin? []  NO  []   YES 3.1.3 Did the subject report any other difficulties in choosing injection site? []  NO  []  YES                 If YES, Please specify: ______________________  Difficulties with Step 2: Twist cap off?  3.2.1  Did the subject twist the cap to remove it? []  NO  []   YES 3.2.2    Did the subject twist the cap counterclockwise? []   NO  []   YES 3.2.3   Did the force/ torque applied sufficient to twist the cap off?  []   NO  []   YES 3.2.4 Did the subject report any other difficulties in twisting the cap  off? []   NO  []  YES                If YES, please specify:___________________  Difficulties with Step 3: Pinch skin and place the autoinjector:  3.3.1  Did the subject pinch skin at injection site? []   NO  [x]   YES 3.3.2 Did the subject place the autoinjector perpendicular to the skin? []   NO  [x]   YES 3.3.3 Did the subject report any other difficulties pinching the skin and placing                  the autoinejctor?  [x]   NO  []   YES  Difficulties with Step 4: Firmly puch down and hold for 3 seconds  3.4.1 Did the subject attempt to inject with the autoinjector in the right position,      i.e needle end down?  []   NO  []   YES 3.4.2 Did the subject push down firmly until it clicked? []  NO  []   YES 3.4.3 Did the subject hold the autoinjector for about 3 seconds or until the        Viewing window turned orange?  []   NO  []  YES 3.4.4 Did the subject report any other difficulties pushing firmly down? []  NO  []   YES     If YES, please specify: _________________    Patient Training & Self-injection of PLACEBO  1. Was the training delivered?  []   No  [x]   Yes      1.1  Date of training     23-Sep-2022      1.2  Start time   1135      1.3  Stop time    1150       2.  Did  the placebo self-injection occur?     []   No  [x]   Yes      2.1 Autoinjector ID / Label    [id number]       Difficulties in using the autoinjector for the placebo self-injection    3.  Were there any difficulties in performing the placebo self-injection [x]   No  []   Yes    INITIAL PATIENT TRAINING  Q1, Did someone (e.g. caregiver, family member) attend the training session             together with the patient?    [x]  No  []   Yes        If yes, please note name phone number and address of other person: ___________________________________________________________________  Q2. Was the following information provided to the subject?             [x]   Heart attack symptoms   [x]   How to act (inject and follow-up actions to  be taken)  [x]   Use of the demo device, including label instructions and IFU  [x]   How to perform the placebo self-injection   Q3  Did the subject correctly reply to the wrap-up questions?   A.  What are common heart attack symptoms?    []   No  [x]   Yes             B.  What has to be done in case any of those symptoms occurs?  []  No  [x]  Yes       Make note of any misconceptions and clarifications given: ______________________________________________________________________  Q4  Where will the subject keep/store the study autoinjectors? One on him and one at home.

## 2022-09-23 NOTE — Discharge Summary (Addendum)
Discharge Summary    Patient ID: [name] MRN: [id number]; DOB: [birth year]  Admit date: 09/19/2022 Discharge date: 09/23/2022  PCP:  Ardith Dark, MD   [facility] Providers Cardiologist:  Kristeen Miss, MD  {  Discharge Diagnoses    Principal Problem:   NSTEMI (non-ST elevated myocardial infarction) [facility]) Active Problems:   Hyperlipidemia LDL goal <70, on Praluent   HTN (hypertension)   CAD in native artery   PAF (paroxysmal atrial fibrillation) [facility])    Diagnostic Studies/Procedures    LHC 09/22/22: Multivessel CAD with: CTO of the RCA, left-to-right collaterals Total occlusion of the ramus intermedius, suspected culprit Severe stenosis of the left circumflex from the ostium into the first OM Patent left main and LAD with mild nonobstructive disease present Normal LVEDP   With prolonged CP now completely resolved since yesterday, no indication for PCI of the occluded ramus branch. Favor medical therapy as no good PCI targets (CTO of the RCA, calcified complex ostial disease of the LCx). With no obstructive disease in the LAD, would reserve CABG for refractory anginal symptoms.  _____________  Echo 09/21/22: 1. Left ventricular ejection fraction, by estimation, is 50 to 55%. The  left ventricle has low normal function. The left ventricle demonstrates  regional wall motion abnormalities (see scoring diagram/findings for  description). There is moderate  asymmetric left ventricular hypertrophy of the septal segment. Left  ventricular diastolic parameters are consistent with Grade I diastolic  dysfunction (impaired relaxation).   2. Right ventricular systolic function is normal. The right ventricular  size is normal. Mildly increased right ventricular wall thickness.  Tricuspid regurgitation signal is inadequate for assessing PA pressure.   3. The mitral valve is grossly normal. Trivial mitral valve  regurgitation.   4. The aortic valve is tricuspid.  There is moderate calcification of the  aortic valve. Aortic valve regurgitation is mild, peripherally at 6  o'clock in short axis. Aortic valve sclerosis/calcification is present,  without any evidence of aortic stenosis.   5. The inferior vena cava is normal in size with greater than 50%  respiratory variability, suggesting right atrial pressure of 3 mmHg.    History of Present Illness     Jonathan Holden is a 77 y.o. male with a history of HTN, PAF, GERD, and CAD presented with hypertensive urgency now hospitalized with NSTEMI awaiting heart cath today. CP initially relieved with protonix, but CE elevated to greater than 1500.   Jonathan Holden presented to the hospital this morning after chest pain which was stuttering throughout the night last night.  Pain began yesterday evening.  He awoke from a nap at 7 PM with chest pain.  He took his nighttime medications as well as some Tums with some potential improvement in his discomfort.  Pain again recurred this morning.  He presented to the emergency room.  In the emergency room, troponins were found to be elevated.  Pain continues to be stuttering.  The patient does have a history of coronary artery disease with angioplasty of the RCA per the patient 30 years ago.  The pain in the center of his chest is similar to his prior discomfort, though 30 years ago he does recall pain radiating to his arms.  Troponin in the emergency room elevated at 205 to 255.   Hospital Course     Consultants: none  NSTEMI CAD HS troponin trended to 1552. Heart catheterization 09/22/22 showed severe mulitvessel disease with CTO of RCA with left to right  collaterals, total occlusion of ramus suspected to be culptril lesion, severe stenosis of the LCX from ostium to OM1, patent left main and LAD. Normal LVEDP. Films reviewed with interventional team and felt medical therapy was his best option given anatomy and chest pain had resolved. Will treat with DAPT given ACS  presentation with ASA and plavix.   Echo with RWMA and preserved EF. Will continue lipitor, repatha, and increased toprol to 50 mg.    PAF - infrequent, per patient - continue 200 mg amiodarone, toprol increased to 50 mg - not anticoagulated due to hx of GI bleeding   Hypercoagulable PAF and DVT -unprovoked, chronic DVT - no OAC due to GI bleed  - following with [facility] to determine if he is a candidate for IVC filter. If not, can also consider watchman device.     Hypertension Hypertensive urgency - BP on presentation 170/100 - lisinopril was initially increased, but subsequently held for creatinine bump and pending heart cath - continue OP 10 mg lisinopril - check BMP at follow up   AKI sCr trended to 1.44 prior to cath, treatd with gentle IVF - sCr today 0.96   Pt seen and examined by Dr. Eldridge Dace and deemed stable for disharge.       Did the patient have an acute coronary syndrome (MI, NSTEMI, STEMI, etc) this admission?:  Yes                               AHA/ACC Clinical Performance & Quality Measures: Aspirin prescribed? - Yes ADP Receptor Inhibitor (Plavix/Clopidogrel, Brilinta/Ticagrelor or Effient/Prasugrel) prescribed (includes medically managed patients)? - Yes Beta Blocker prescribed? - Yes High Intensity Statin (Lipitor 40-80mg  or Crestor 20-40mg ) prescribed? - Yes EF assessed during THIS hospitalization? - Yes For EF <40%, was ACEI/ARB prescribed? - Yes For EF <40%, Aldosterone Antagonist (Spironolactone or Eplerenone) prescribed? - Not Applicable (EF >/= 40%) Cardiac Rehab Phase II ordered (including medically managed patients)? - Yes       The patient will be scheduled for a TOC follow up appointment in 7-14 days.  A message has been sent to the [facility] and Scheduling Pool at the office where the patient should be seen for follow up.  _____________  Discharge Vitals Blood pressure 134/84, pulse 63, temperature 98.4 F (36.9 C), temperature source  Oral, resp. rate 18, height 6\' 2"  (1.88 m), weight 98.4 kg, SpO2 96%.  Filed Weights   09/19/22 0802 09/19/22 1442  Weight: 98.4 kg 98.4 kg    Labs & Radiologic Studies    CBC Recent Labs    09/22/22 0438 09/23/22 0550  WBC 5.2 5.2  HGB 14.2 14.2  HCT 41.9 42.6  MCV 101.7* 101.9*  PLT 155 148*   Basic Metabolic Panel Recent Labs    16/10/96 0438 09/23/22 0550  NA 137 138  K 4.0 3.8  CL 104 110  CO2 22 19*  GLUCOSE 95 99  BUN 20 15  CREATININE 1.44* 0.96  CALCIUM 9.2 9.1   Liver Function Tests No results for input(s): "AST", "ALT", "ALKPHOS", "BILITOT", "PROT", "ALBUMIN" in the last 72 hours. No results for input(s): "LIPASE", "AMYLASE" in the last 72 hours. High Sensitivity Troponin:   Recent Labs  Lab 09/19/22 0819 09/19/22 1004 09/20/22 1219 09/20/22 1504  TROPONINIHS 205* 255* 1,552* 1,551*    BNP Invalid input(s): "POCBNP" D-Dimer No results for input(s): "DDIMER" in the last 72 hours. Hemoglobin A1C No results for  input(s): "HGBA1C" in the last 72 hours. Fasting Lipid Panel No results for input(s): "CHOL", "HDL", "LDLCALC", "TRIG", "CHOLHDL", "LDLDIRECT" in the last 72 hours. Thyroid Function Tests No results for input(s): "TSH", "T4TOTAL", "T3FREE", "THYROIDAB" in the last 72 hours.  Invalid input(s): "FREET3" _____________  CARDIAC CATHETERIZATION  Result Date: 09/22/2022 Multivessel CAD with: CTO of the RCA, left-to-right collaterals Total occlusion of the ramus intermedius, suspected culprit Severe stenosis of the left circumflex from the ostium into the first OM Patent left main and LAD with mild nonobstructive disease present Normal LVEDP With prolonged CP now completely resolved since yesterday, no indication for PCI of the occluded ramus branch. Favor medical therapy as no good PCI targets (CTO of the RCA, calcified complex ostial disease of the LCx). With no obstructive disease in the LAD, would reserve CABG for refractory anginal symptoms.    ECHOCARDIOGRAM COMPLETE  Result Date: 09/21/2022    ECHOCARDIOGRAM REPORT   Patient Name:   Jonathan Holden Date of Exam: 09/21/2022 Medical Rec #:  [id number]        Height:       74.0 in Accession #:    [id number]       Weight:       217.0 lb Date of Birth:  07-11-1945        BSA:          2.250 m Patient Age:    77 years         BP:           148/77 mmHg Patient Gender: M                HR:           82 bpm. Exam Location:  Inpatient Procedure: 2D Echo, Cardiac Doppler, Color Doppler and Intracardiac            Opacification Agent Indications:    NSTEMI I21.4  History:        Patient has prior history of Echocardiogram examinations, most                 recent 03/11/2016. CHF, CAD and Previous Myocardial Infarction,                 Arrythmias:Atrial Fibrillation; Risk Factors:Hypertension and                 Dyslipidemia.  Sonographer:    Celesta Gentile RCS Referring Phys: [phone] DAYNA N DUNN IMPRESSIONS  1. Left ventricular ejection fraction, by estimation, is 50 to 55%. The left ventricle has low normal function. The left ventricle demonstrates regional wall motion abnormalities (see scoring diagram/findings for description). There is moderate asymmetric left ventricular hypertrophy of the septal segment. Left ventricular diastolic parameters are consistent with Grade I diastolic dysfunction (impaired relaxation).  2. Right ventricular systolic function is normal. The right ventricular size is normal. Mildly increased right ventricular wall thickness. Tricuspid regurgitation signal is inadequate for assessing PA pressure.  3. The mitral valve is grossly normal. Trivial mitral valve regurgitation.  4. The aortic valve is tricuspid. There is moderate calcification of the aortic valve. Aortic valve regurgitation is mild, peripherally at 6 o'clock in short axis. Aortic valve sclerosis/calcification is present, without any evidence of aortic stenosis.  5. The inferior vena cava is normal in size with greater than 50%  respiratory variability, suggesting right atrial pressure of 3 mmHg. Comparison(s): Prior images unable to be directly viewed. FINDINGS  Left Ventricle: Left ventricular ejection fraction, by estimation,  is 50 to 55%. The left ventricle has low normal function. The left ventricle demonstrates regional wall motion abnormalities. Definity contrast agent was given IV to delineate the left ventricular endocardial borders. The left ventricular internal cavity size was normal in size. There is moderate asymmetric left ventricular hypertrophy of the septal segment. Left ventricular diastolic parameters are consistent with Grade I diastolic dysfunction (impaired relaxation).  LV Wall Scoring: The basal inferior segment and basal inferoseptal segment are hypokinetic. The entire anterior wall, entire lateral wall, entire anterior septum, entire apex, mid and distal inferior wall, and mid inferoseptal segment are normal. Right Ventricle: The right ventricular size is normal. Mildly increased right ventricular wall thickness. Right ventricular systolic function is normal. Tricuspid regurgitation signal is inadequate for assessing PA pressure. Left Atrium: Left atrial size was normal in size. Right Atrium: Right atrial size was normal in size. Pericardium: There is no evidence of pericardial effusion. Mitral Valve: The mitral valve is grossly normal. Mild mitral annular calcification. Trivial mitral valve regurgitation. Tricuspid Valve: The tricuspid valve is grossly normal. Tricuspid valve regurgitation is trivial. Aortic Valve: The aortic valve is tricuspid. There is moderate calcification of the aortic valve. There is mild aortic valve annular calcification. Aortic valve regurgitation is mild. Aortic valve sclerosis/calcification is present, without any evidence of aortic stenosis. Pulmonic Valve: The pulmonic valve was grossly normal. Pulmonic valve regurgitation is trivial. Aorta: The aortic root is normal in size and  structure. Venous: The inferior vena cava is normal in size with greater than 50% respiratory variability, suggesting right atrial pressure of 3 mmHg. IAS/Shunts: No atrial level shunt detected by color flow Doppler.  LEFT VENTRICLE PLAX 2D LVIDd:         5.00 cm   Diastology LVIDs:         3.60 cm   LV e' medial:    5.00 cm/s LV PW:         1.20 cm   LV E/e' medial:  8.2 LV IVS:        1.40 cm   LV e' lateral:   5.98 cm/s LVOT diam:     2.20 cm   LV E/e' lateral: 6.9 LV SV:         86 LV SV Index:   38 LVOT Area:     3.80 cm  RIGHT VENTRICLE RV S prime:     12.00 cm/s TAPSE (M-mode): 2.0 cm LEFT ATRIUM             Index        RIGHT ATRIUM           Index LA diam:        2.90 cm 1.29 cm/m   RA Area:     16.40 cm LA Vol (A2C):   77.8 ml 34.58 ml/m  RA Volume:   41.40 ml  18.40 ml/m LA Vol (A4C):   56.0 ml 24.89 ml/m LA Biplane Vol: 66.1 ml 29.38 ml/m  AORTIC VALVE LVOT Vmax:   88.30 cm/s LVOT Vmean:  53.000 cm/s LVOT VTI:    0.227 m  AORTA Ao Root diam: 3.60 cm MITRAL VALVE MV Area (PHT): 2.69 cm    SHUNTS MV Decel Time: 282 msec    Systemic VTI:  0.23 m MV E velocity: 41.00 cm/s  Systemic Diam: 2.20 cm MV A velocity: 76.50 cm/s MV E/A ratio:  0.54 Nona Dell MD Electronically signed by Nona Dell MD Signature Date/Time: 09/21/2022/3:26:16 PM    Final  CT Angio Chest PE W and/or Wo Contrast  Result Date: 09/19/2022 CLINICAL DATA:  Pulmonary embolism suspected. High probability. New onset chest pain overnight. No known DVT. EXAM: CT ANGIOGRAPHY CHEST WITH CONTRAST TECHNIQUE: Multidetector CT imaging of the chest was performed using the standard protocol during bolus administration of intravenous contrast. Multiplanar CT image reconstructions and MIPs were obtained to evaluate the vascular anatomy. RADIATION DOSE REDUCTION: This exam was performed according to the departmental dose-optimization program which includes automated exposure control, adjustment of the mA and/or kV according to patient  size and/or use of iterative reconstruction technique. CONTRAST:  75mL OMNIPAQUE IOHEXOL 350 MG/ML SOLN COMPARISON:  None Available. FINDINGS: Cardiovascular: Satisfactory opacification of the pulmonary arteries to the segmental level. No evidence of pulmonary embolism. Normal heart size. No pericardial effusion. Aortic atherosclerosis. Coronary artery calcifications. Mediastinum/Nodes: Thyroid gland, trachea, and esophagus are normal. No mediastinal or hilar adenopathy. Lungs/Pleura: No pleural effusion or consolidative change. Paraseptal and centrilobular emphysema with diffuse bronchial wall thickening. Mild, heterogeneous ground-glass attenuation is identified bilaterally with areas of air trapping. No pneumothorax identified. Perifissural nodule along the minor fissure of the right lung likely represents a benign intrapulmonary lymph node measuring 4 mm. Calcified granulomas noted within the right upper lobe. Upper Abdomen: No acute abnormality. Musculoskeletal: No chest wall abnormality. No acute or significant osseous findings. Review of the MIP images confirms the above findings. IMPRESSION: 1. No evidence for acute pulmonary embolus. 2. Diffuse bronchial wall thickening with emphysema, as above; imaging findings suggestive of underlying COPD. 3. Mild, heterogeneous ground-glass attenuation is identified bilaterally with areas of air trapping. Findings are nonspecific but may be seen in the setting of small airways disease. 4. Coronary artery calcifications. 5. Aortic Atherosclerosis (ICD10-I70.0) and Emphysema (ICD10-J43.9). Electronically Signed   By: Signa Kell M.D.   On: 09/19/2022 13:39   DG Chest 2 View  Result Date: 09/19/2022 CLINICAL DATA:  Chest pain EXAM: CHEST - 2 VIEW COMPARISON:  06/16/2018 FINDINGS: Heart size and mediastinal contours are unremarkable. There is no pleural effusion. Scar versus platelike atelectasis is identified within the left base. No airspace consolidation. No acute  osseous findings identified. IMPRESSION: 1. No acute cardiopulmonary disease. 2. Left basilar scar versus platelike atelectasis. Electronically Signed   By: Signa Kell M.D.   On: 09/19/2022 09:25   Disposition   Pt is being discharged home today in good condition.  Follow-up Plans & Appointments     Discharge Instructions     Amb Referral to Cardiac Rehabilitation   Complete by: As directed    Diagnosis: NSTEMI   After initial evaluation and assessments completed: Virtual Based Care may be provided alone or in conjunction with Phase 2 Cardiac Rehab based on patient barriers.: Yes   Intensive Cardiac Rehabilitation (ICR) [facility] location only OR Traditional Cardiac Rehabilitation (TCR) *If criteria for ICR are not met will enroll in TCR [place] only): Yes        Discharge Medications   Allergies as of 09/23/2022       Reactions   Eliquis [apixaban] Other (See Comments)   Recurrent GI bleeding occurred from diverticulosis in the setting of anticoagulation therapy for atrial fibrillation.   Food Other (See Comments)   Pt is allergic to melons caused scratchy throat   Glutamic Acid Other (See Comments)   Pt is allergic to melons caused scratchy throat   Grass Pollen(k-o-[id number]-t-swt Vern)    Sneezing    Ciprofloxacin Hives   Pollen Extract Other (See Comments)   Sneezing  Medication List     TAKE these medications    amiodarone 200 MG tablet Commonly known as: PACERONE TAKE 1 TABLET (200 MG TOTAL) BY MOUTH DAILY. \   aspirin EC 81 MG tablet Take 1 tablet (81 mg total) by mouth daily. Swallow whole. Start taking on: September 24, 2022   atorvastatin 40 MG tablet Commonly known as: LIPITOR Take 1 tablet (40 mg total) by mouth daily. Start taking on: September 24, 2022   clobetasol ointment 0.05 % Commonly known as: TEMOVATE APPLY TO AFFECTED AREA TWICE A DAY   clopidogrel 75 MG tablet Commonly known as: PLAVIX Take 1 tablet (75 mg total) by mouth daily. Start  taking on: September 24, 2022   ketoconazole 2 % shampoo Commonly known as: NIZORAL APPLY 1 TO 3 TIMES A WEEK TO SCALP. LET SIT 10 MIN THEN RINSE OUT. STRENGTH: 2 % What changed:  how much to take how to take this when to take this   lisinopril 10 MG tablet Commonly known as: ZESTRIL TAKE 1 TABLET BY MOUTH DAILY. PLEASE KEEP UPCOMING APPOINTMENT WITH DR. Katrinka Blazing IN APRIL 2023   LUBRICATING EYE DROPS OP Place 1 drop into both eyes daily as needed (dry eyes).   metoprolol succinate 50 MG 24 hr tablet Commonly known as: TOPROL-XL Take 1 tablet (50 mg total) by mouth daily. Take with or immediately following a meal. Start taking on: September 24, 2022 What changed:  medication strength how much to take how to take this when to take this additional instructions   nitroGLYCERIN 0.4 MG SL tablet Commonly known as: NITROSTAT Place 1 tablet (0.4 mg total) under the tongue every 5 (five) minutes x 3 doses as needed for chest pain.   Repatha SureClick 140 MG/ML Soaj Generic drug: Evolocumab Inject 140 mg into the skin every 14 (fourteen) days.   valACYclovir 1000 MG tablet Commonly known as: VALTREX Take 2 tablets by mouth daily. If having a flare up, pt will take an additional tablet.           Outstanding Labs/Studies   BMP  Duration of Discharge Encounter   Greater than 30 minutes including physician time.  Signed, Roe Rutherford Duke, PA 09/23/2022, 9:38 AM  I have examined the patient and reviewed assessment and plan and discussed with patient.  Agree with above as stated.    GEN: Well nourished, well developed, in no acute distress  HEENT: normal  Neck: no JVD, carotid bruits, or masses Cardiac: RRR; no murmurs, rubs, or gallops,no edema  Respiratory:  clear to auscultation bilaterally, normal work of breathing GI: soft, nontender, nondistended,  MS: no deformity or atrophy , no right wrist hematoma; 2+ right radial pulse Skin: warm and dry, no rash Neuro:   Strength and sensation are intact Psych: euthymic mood, full affect  Adding statin to his PCSK9 inhibitor.  Clopidogrel was added.  No chest discomfort with walking.  Continue medical therapy for his calcific coronary artery disease.  Plan for discharge later today.  Lance Muss

## 2022-09-23 NOTE — Plan of Care (Signed)

## 2022-09-23 NOTE — Care Management Important Message (Signed)
Important Message  Patient Details  Name: Jonathan Holden MRN: [id number] Date of Birth: 1945/11/24   Medicare Important Message Given:  Yes     Renie Ora 09/23/2022, 9:34 AM

## 2022-09-23 NOTE — Research (Signed)
ID- [id number],  SOS-AMI  SUBJECT INFORMED CONSENT PROCESS   Subject Consented by:   []   Principal Investigator:  Jodelle Red, MD []   Sub-Investigator: Shawnie Pons, MD []   Research Nurse: Mercer Pod, RN  [x]   Research Nurse: Brunilda Payor, RN   Consenting Process:   [x]   The above named investigator/' Research Nurse explained the study and SIL-ICF,           in their entirety,  to the Subject and Any / All third parties deemed necessary by the subject.  [x]   All medical questions, pertaining to the Study, were asked and fully answered.    [x]   Subject voiced understanding of the Informed Consent contents and is        agreeable to proceed with the SOS-AMI study.   Both the Subject and Investigator/ Research Nurse signed the SIL-ICF on:    Date of Consent: ______04-09-2024______   ([id number]) Time of Consent: ______0950_______  (24 hour clock)  [x]  No study related assessment or testing has been performed prior to the        Subject's ICF signature.   [x]  A signed copy of the SIL-ICF was given to the subject/ legal representative,         as applicable.   Investigator / Research Nurse Comments: patient verbalizes understanding.

## 2022-09-24 ENCOUNTER — Telehealth: Payer: Self-pay

## 2022-09-24 NOTE — Transitions of Care (Post Inpatient/ED Visit) (Signed)
09/24/2022  Name: Jonathan Holden MRN: [id number] DOB: 1945/08/17  Today's TOC FU Call Status: Today's TOC FU Call Status:: Successful TOC FU Call Completed TOC FU Call Complete Date: 09/24/22 Patient's Name and Date of Birth confirmed.  Transition Care Management Follow-up Telephone Call Date of Discharge: 09/23/22 Discharge Facility: [facility]) Type of Discharge: Inpatient Admission Primary Inpatient Discharge Diagnosis:: "NSTEMI" How have you been since you were released from the hospital?: Better (Pt pleased to report he is "doing well-slept 11hrs last night." he has had no pain or acute sxs sicne returning home. Appetite good. No issues with elimiantion. He is is up walking & moving around. Incision healing-no s/s infection.) Any questions or concerns?: No  Items Reviewed: Did you receive and understand the discharge instructions provided?: Yes Medications obtained,verified, and reconciled?: Yes (Medications Reviewed) Any new allergies since your discharge?: No Dietary orders reviewed?: Yes Type of Diet Ordered:: low salt/heart healthy Do you have support at home?: Yes People in Home: significant other  Medications Reviewed Today: Medications Reviewed Today     Reviewed by Charlyn Minerva, RN (Registered Nurse) on 09/24/22 at 1452  Med List Status: <None>   Medication Order Taking? Sig Documenting Provider Last Dose Status Informant  amiodarone (PACERONE) 200 MG tablet [id number] Yes TAKE 1 TABLET (200 MG TOTAL) BY MOUTH DAILY. \ Nahser, Deloris Ping, MD Taking Active Self  aspirin EC 81 MG tablet [id number] Yes Take 1 tablet (81 mg total) by mouth daily. Swallow whole. Marcelino Duster, PA Taking Active   atorvastatin (LIPITOR) 40 MG tablet [id number] Yes Take 1 tablet (40 mg total) by mouth daily. Marcelino Duster, PA Taking Active   Carboxymethylcellul-Glycerin (LUBRICATING EYE DROPS OP) [id number] Yes Place 1 drop into both eyes daily as needed (dry eyes).  [provider] Taking Active Self  clobetasol ointment (TEMOVATE) 0.05 % [id number] Yes APPLY TO AFFECTED AREA TWICE A DAY Ardith Dark, MD Taking Active Self  clopidogrel (PLAVIX) 75 MG tablet [id number] Yes Take 1 tablet (75 mg total) by mouth daily. Marcelino Duster, PA Taking Active   Evolocumab [facility] SURECLICK) 140 MG/ML Ivory Broad [id number] Yes Inject 140 mg into the skin every 14 (fourteen) days. Lyn Records, MD Taking Active Self           Med Note Kandis Cocking Alinda Dooms A   XFG Sep 19, 2022  5:03 PM)    ketoconazole (NIZORAL) 2 % shampoo [id number] Yes APPLY 1 TO 3 TIMES A WEEK TO SCALP. LET SIT 10 MIN THEN RINSE OUT. STRENGTH: 2 %  Patient taking differently: Apply 1 Application topically 3 (three) times a week. APPLY 1 TO 3 TIMES A WEEK TO SCALP. LET SIT 10 MIN THEN RINSE OUT. STRENGTH: 2 %   Ardith Dark, MD Taking Active Self  lisinopril (ZESTRIL) 10 MG tablet [id number] Yes TAKE 1 TABLET BY MOUTH DAILY. PLEASE KEEP UPCOMING APPOINTMENT WITH DR. Katrinka Blazing IN APRIL 2023 Nahser, Deloris Ping, MD Taking Active Self  metoprolol succinate (TOPROL-XL) 50 MG 24 hr tablet [id number] Yes Take 1 tablet (50 mg total) by mouth daily. Take with or immediately following a meal. Duke, Roe Rutherford, PA Taking Active   nitroGLYCERIN (NITROSTAT) 0.4 MG SL tablet [id number] Yes Place 1 tablet (0.4 mg total) under the tongue every 5 (five) minutes x 3 doses as needed for chest pain. Marcelino Duster, PA Taking Active   Study - SOS-AMI - selatogrel 16 mg/0.5 mL or placebo SQ injection (PI-Christopher) [id number]  Inject  16 mg into the skin once. For Investigational Use Only. Inject 0.5 mL subcutaneously in the abdomen or thigh if you experience symptoms of a heart attack. Call 911 immediately and seek emergency medical. Please contact [facility] for any questions or concerns regarding this medication. [provider]  Active   valACYclovir (VALTREX) 1000 MG tablet [id number]  Yes Take 2 tablets by mouth daily. If having a flare up, pt will take an additional tablet. [provider] Taking Active Self            Home Care and Equipment/Supplies: Were Home Health Services Ordered?: NA Any new equipment or medical supplies ordered?: NA  Functional Questionnaire: Do you need assistance with bathing/showering or dressing?: No Do you need assistance with meal preparation?: No Do you need assistance with eating?: No Do you have difficulty maintaining continence: No Do you need assistance with getting out of bed/getting out of a chair/moving?: No Do you have difficulty managing or taking your medications?: No  Follow up appointments reviewed: PCP Follow-up appointment confirmed?: Yes Date of PCP follow-up appointment?: 10/02/22 Follow-up Provider: Dr. Durene Cal Specialist [facility] Follow-up appointment confirmed?: Yes Date of Specialist follow-up appointment?: 09/30/22 Follow-Up Specialty Provider:: Dr. Dick-cardiology Do you need transportation to your follow-up appointment?: No (pt confirms he is able to get to appts-he is aware per d/c instructions of the time limit of no driving for him) Do you understand care options if your condition(s) worsen?: Yes-patient verbalized understanding  SDOH Interventions Today    Flowsheet Row Most Recent Value  SDOH Interventions   Food Insecurity Interventions Intervention Not Indicated  Transportation Interventions Intervention Not Indicated      TOC Interventions Today    Flowsheet Row Most Recent Value  TOC Interventions   TOC Interventions Discussed/Reviewed TOC Interventions Discussed, Post discharge activity limitations per provider, Post op wound/incision care, S/S of infection      Interventions Today    Flowsheet Row Most Recent Value  Chronic Disease   Chronic disease during today's visit Other  [NSTEMI]  General Interventions   General Interventions Discussed/Reviewed General  Interventions Discussed, Doctor Visits, Referral to Nurse  [decline-pt is dong well-managing-does not feel its neccessary]  Doctor Visits Discussed/Reviewed Doctor Visits Discussed, PCP, Specialist  PCP/Specialist Visits Compliance with follow-up visit  Education Interventions   Education Provided Provided Education  Provided Verbal Education On Nutrition, Medication, When to see the doctor  Nutrition Interventions   Nutrition Discussed/Reviewed Nutrition Discussed, Adding fruits and vegetables, Decreasing salt, Increasing proteins, Decreasing fats, Fluid intake  Pharmacy Interventions   Pharmacy Dicussed/Reviewed Pharmacy Topics Discussed, Medications and their functions  Safety Interventions   Safety Discussed/Reviewed Safety Discussed       Alessandra Grout [facility] Care Management Care Management Community Coordinator Direct Phone: [phone] Toll Free: [phone] Fax: [phone]

## 2022-09-28 ENCOUNTER — Telehealth (HOSPITAL_COMMUNITY): Payer: Self-pay

## 2022-09-28 NOTE — Telephone Encounter (Signed)
Pt is not interested in the cardiac rehab program, pt stated that he is doing his own exercise at home. Closed referral.

## 2022-09-29 NOTE — Progress Notes (Unsigned)
Cardiology Office Note    Patient Name: Jonathan Holden Date of Encounter: 09/29/2022  Primary Care Provider:  Ardith Dark, MD Primary Cardiologist:  Kristeen Miss, MD Primary Electrophysiologist: Will Jorja Loa, MD   Past Medical History    Past Medical History:  Diagnosis Date   Acute lower GI bleeding 03/19/2016   Arthritis    Atrial fibrillation with RVR (HCC) 02/21/2016   Coronary artery disease    Diverticulosis 04/18/2016   Dysrhythmia    AFIB   Endocarditis of native valve    Former smoker    History of MI (myocardial infarction) 1992   Hyperlipidemia    Hypertension    Infection of intervertebral disc (HCC)    Insomnia 04/18/2016   Left retinal detachment 03/18/2015   Loose body of right shoulder    Myocardial infarction [facility])    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 male with a PMH of CAD s/p inferior MI treated with PCI to RCA 1992, NSTEMI 08/2022 showing CTO of RCA with left-to-right collaterals, total occlusion of ramus intermedius, severe stenosis of left circumflex from ostium into the OM with mild nonobstructive disease in the left main and LAD, HTN, HLD, PAF chronic bilateral DVT's, GI bleeds d/t diverticulosis and currently not on anticoagulation who presents today for posthospital follow-up.  Mr. Poffenberger was seen initially by Dr. Katrinka Blazing in 2015 and is currently followed by Dr. Elease Hashimoto.  He presented to the ED on 09/19/2022 with complaint of chest pain.  He was found to have severely elevated BP and  hs troponin 205 --> 255 --> 1552 --> 1551.  He was started on heparin and underwent LHC that showed CTO of RCA with left-to-right collaterals, total occlusion of ramus intermedius, severe stenosis of left  circumflex from ostium into the OM with mild nonobstructive disease in the left main and LAD.  He had resolution of chest pain and notification was made for PCI the occluded ramus branch with choice of medical therapy due to no good targets for CTO of the RCA and calcified ostial disease.  Patient was started on DAPT with ASA 81 mg and Plavix 75 mg for 1 year.  CABG will be reserved if refractory anginal symptoms develop.  During today's visit the patient reports*** .  Patient denies chest pain, palpitations, dyspnea, PND, orthopnea, nausea, vomiting, dizziness, syncope, edema, weight gain, or early satiety.  ***Notes: -Last ischemic evaluation: 09/22/2022 by LHC -Last echo: 09/21/2022 -Interim ED visits: 09/19/2022 Review of Systems  Please see the history of present illness.    All other systems reviewed and are otherwise negative except as noted above.  Physical Exam    Wt Readings from Last 3 Encounters:  09/19/22 217 lb (98.4 kg)  07/30/22 221 lb 3.2 oz (100.3 kg)  04/01/22 221 lb (100.2 kg)   ZO:XWRUE were no vitals filed for this visit.,There is no height or weight on file to calculate BMI. GEN: Well nourished, well developed in no acute distress Neck: No JVD; No carotid bruits Pulmonary: Clear to auscultation without rales, wheezing or rhonchi  Cardiovascular: Normal rate. Regular rhythm. Normal S1. Normal S2.   Murmurs: There is no murmur.  ABDOMEN: Soft, non-tender, non-distended EXTREMITIES:  No  edema; No deformity   EKG/LABS/ Recent Cardiac Studies   ECG personally reviewed by me today - ***  Risk Assessment/Calculations:   {Does this patient have ATRIAL FIBRILLATION?[phone]}      Lab Results  Component Value Date   WBC 5.2 09/23/2022   HGB 14.2 09/23/2022   HCT 42.6 09/23/2022   MCV 101.9 (H) 09/23/2022   PLT 148 (L) 09/23/2022   Lab Results  Component Value Date   CREATININE 0.96 09/23/2022   BUN 15 09/23/2022   NA 138 09/23/2022   K 3.8 09/23/2022   CL  110 09/23/2022   CO2 19 (L) 09/23/2022   Lab Results  Component Value Date   CHOL 247 (H) 09/20/2022   HDL 38 (L) 09/20/2022   LDLCALC 137 (H) 09/20/2022   LDLDIRECT 80.0 07/25/2020   TRIG 359 (H) 09/20/2022   CHOLHDL 6.5 09/20/2022    No results found for: "HGBA1C" Assessment & Plan    1.  Coronary artery disease: -s/p inferior MI 1992 with previous PCI to RCA and NSTEMI 08/2022 with CTO of RCA with severe stenosis of the LCX from ostium to OM1, patent left main and LAD.  -Patient was started on DAPT with ASA and Plavix for 1 year -Continue***  2.  Paroxysmal AF: -Patient currently on rate control with amiodarone 200 mg and Toprol-XL 50 mg -Patient currently not on AC due to history of GI bleeding  3.  Hyperlipidemia: -Patient's last LDL cholesterol was*** -Continue***  4.Hypercoagulable PAF and DVT -unprovoked, chronic DVT:   5.  Essential hypertension: -Patient had initial BP of 170/100 -Today patient's blood pressure is*** -Continue lisinopril 10 mg daily and Toprol-XL 50 mg daily     Disposition: Follow-up with Kristeen Miss, MD or APP in *** months {Are you ordering a CV Procedure (e.g. stress test, cath, DCCV, TEE, etc)?   Press F2        :[id number]}   Signed, Napoleon Form, Leodis Rains, NP 09/29/2022, 8:46 PM [facility]

## 2022-09-30 ENCOUNTER — Encounter: Payer: Self-pay | Admitting: Nurse Practitioner

## 2022-09-30 ENCOUNTER — Ambulatory Visit: Payer: Medicare Other | Attending: Nurse Practitioner | Admitting: Nurse Practitioner

## 2022-09-30 ENCOUNTER — Telehealth (HOSPITAL_COMMUNITY): Payer: Self-pay

## 2022-09-30 VITALS — BP 122/62 | HR 64 | Ht 74.0 in | Wt 216.0 lb

## 2022-09-30 DIAGNOSIS — I2510 Atherosclerotic heart disease of native coronary artery without angina pectoris: Secondary | ICD-10-CM | POA: Insufficient documentation

## 2022-09-30 DIAGNOSIS — I480 Paroxysmal atrial fibrillation: Secondary | ICD-10-CM | POA: Insufficient documentation

## 2022-09-30 DIAGNOSIS — I82513 Chronic embolism and thrombosis of femoral vein, bilateral: Secondary | ICD-10-CM | POA: Insufficient documentation

## 2022-09-30 DIAGNOSIS — I10 Essential (primary) hypertension: Secondary | ICD-10-CM | POA: Insufficient documentation

## 2022-09-30 DIAGNOSIS — E785 Hyperlipidemia, unspecified: Secondary | ICD-10-CM | POA: Insufficient documentation

## 2022-09-30 MED ORDER — ISOSORBIDE MONONITRATE ER 30 MG PO TB24: 30.0000 mg | ORAL_TABLET | Freq: Every day | ORAL | 1 refills | Status: DC

## 2022-09-30 NOTE — Telephone Encounter (Signed)
Pt insurance is active and benefits verified through Medicare a/b Co-pay 0, DED $240/$240 met, out of pocket 0/0 met, co-insurance 20%. no pre-authorization required. Passport, 09/30/2022@4 :06, REF# [id number]  2ndary insurance is active and benefits verified through [employer] [place]. Co-pay 0, DED 0/0 met, out of pocket 0/0 met, co-insurance 0%. No pre-authorization required.   How many CR sessions are covered? (for ICR)72 Is this a lifetime maximum or an annual maximum? annual Has the member used any of these services to date? no Is there a time limit (weeks/months) on start of program and/or program completion? no

## 2022-09-30 NOTE — Patient Instructions (Addendum)
Medication Instructions:  START Imdur 30mg  Take 1 tablet once a day  *If you need a refill on your cardiac medications before your next appointment, please call your pharmacy*   Lab Work: None ordered   Testing/Procedures: Please call cardiac rehab and tell them you are interested in cardiac rehab [phone]   Follow-Up: At [facility], you and your health needs are our priority.  As part of our continuing mission to provide you with exceptional heart care, we have created designated Provider Care Teams.  These Care Teams include your primary Cardiologist (physician) and Advanced Practice Providers (APPs -  Physician Assistants and Nurse Practitioners) who all work together to provide you with the care you need, when you need it.  We recommend signing up for the patient portal called "MyChart".  Sign up information is provided on this After Visit Summary.  MyChart is used to connect with patients for Virtual Visits (Telemedicine).  Patients are able to view lab/test results, encounter notes, upcoming appointments, etc.  Non-urgent messages can be sent to your provider as well.   To learn more about what you can do with MyChart, go to [email or web address].    Your next appointment:   1 month(s)  Provider:   Kristeen Miss, MD  or Robin Searing, NP   Other Instructions  Check your blood pressure daily for 2 weeks, then contact the office with your readings.  Make sure to check 2 hours after your medications.   AVOID these things for 30 minutes before checking your blood pressure: No Drinking caffeine. No Drinking alcohol. No Eating. No Smoking. No Exercising.  Five minutes before checking your blood pressure: Pee. Sit in a dining chair. Avoid sitting in a soft couch or armchair. Be quiet. Do not talk.

## 2022-10-02 ENCOUNTER — Ambulatory Visit (INDEPENDENT_AMBULATORY_CARE_PROVIDER_SITE_OTHER): Payer: Medicare Other | Admitting: Family Medicine

## 2022-10-02 ENCOUNTER — Encounter: Payer: Self-pay | Admitting: Family Medicine

## 2022-10-02 VITALS — BP 123/69 | HR 75 | Temp 98.0°F | Ht 74.0 in | Wt 215.8 lb

## 2022-10-02 DIAGNOSIS — I25119 Atherosclerotic heart disease of native coronary artery with unspecified angina pectoris: Secondary | ICD-10-CM

## 2022-10-02 DIAGNOSIS — E785 Hyperlipidemia, unspecified: Secondary | ICD-10-CM

## 2022-10-02 DIAGNOSIS — I10 Essential (primary) hypertension: Secondary | ICD-10-CM | POA: Diagnosis not present

## 2022-10-02 DIAGNOSIS — I480 Paroxysmal atrial fibrillation: Secondary | ICD-10-CM

## 2022-10-02 NOTE — Assessment & Plan Note (Signed)
Recently in the hospital with NSTEMI though no areas amenable to stent on cath.  Will continue management per cardiology with metoprolol 50 mg daily, lisinopril 10 mg daily, Plavix 75 mg daily, Lipitor 10 mg daily, Imdur 30 mg daily.  He is currently on Repatha though would like to try to get back on Praluent.

## 2022-10-02 NOTE — Assessment & Plan Note (Signed)
Follows with cardiology.  He was switched to Repatha at the beginning of the year and has had significant elevation in his LDL.  He would like to get back on Praluent.  Advised him to follow-up with cardiology soon to discuss.

## 2022-10-02 NOTE — Assessment & Plan Note (Signed)
Regular rate and rhythm on exam today.  Continue management per cardiology.

## 2022-10-02 NOTE — Progress Notes (Signed)
Jonathan Holden is a 77 y.o. male who presents today for an office visit.  Assessment/Plan:  Chronic Problems Addressed Today: Coronary artery disease involving native coronary artery of native heart with angina pectoris (HCC) Recently in the hospital with NSTEMI though no areas amenable to stent on cath.  Will continue management per cardiology with metoprolol 50 mg daily, lisinopril 10 mg daily, Plavix 75 mg daily, Lipitor 10 mg daily, Imdur 30 mg daily.  He is currently on Repatha though would like to try to get back on Praluent.  Hyperlipidemia LDL goal <70, on Praluent Follows with cardiology.  He was switched to Repatha at the beginning of the year and has had significant elevation in his LDL.  He would like to get back on Praluent.  Advised him to follow-up with cardiology soon to discuss.  HTN (hypertension) Blood pressure at goal today on current regimen metoprolol succinate 50 mg daily and lisinopril 10 mg daily.  Paroxysmal atrial fibrillation with RVR (HCC) Regular rate and rhythm on exam today.  Continue management per cardiology.     Subjective:  HPI:  See A/P for status of chronic conditions.  Patient here today for annual follow-up.  He was admitted a couple of weeks ago on 09/19/2022 with NSTEMI.  Presented to the ED with chest pain.  In the ED was found to have elevated troponin.  Was admitted for further management.  Underwent left heart cath but no targets amenable to PCI were identified.  He was managed medically.  Continued on Lipitor, Repatha and increased metoprolol to 50 mg daily.  He was discharged home in stable condition.  Since being home he has been doing reasonably well. No recurrence of symptoms.   ROS: Per HPI, otherwise a complete review of systems was negative.   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   Pseudophakia of left eye 05/22/2013   Pseudophakia of right eye 06/05/2013   S/P coronary angioplasty    Skin cancer    Hands, arms, nose   Streptococcal bacteremia    Patient Active Problem List   Diagnosis Date Noted   Lower extremity edema 03/31/2022   Acute deep vein thrombosis (DVT) of femoral vein of both lower extremities (HCC) 12/23/2021   History of adenomatous polyp of colon 09/10/2021   Osteoarthritis of left hip s/p replacement 07/2021 07/23/2021   Unsteady gait 07/25/2020   CHF (congestive heart failure) (HCC) 07/25/2019   Cold sore 07/25/2019   Diverticulosis 04/18/2016   Insomnia 04/18/2016   H/O gastric ulcer    Former smoker    Gastroesophageal reflux disease    Paroxysmal atrial fibrillation with RVR (HCC) 02/21/2016   Coronary artery disease involving native coronary artery of native heart with angina pectoris (HCC) 09/19/2014   HTN (hypertension) 09/19/2014   Hyperlipidemia LDL goal <70, on Praluent 21/30/8657  Past Surgical History:  Procedure Laterality Date   CARDIOVASCULAR STRESS TEST  08/22/2008   CATARACT EXTRACTION W/ INTRAOCULAR LENS IMPLANT Bilateral    COLONOSCOPY WITH PROPOFOL N/A 02/10/2022   Procedure: COLONOSCOPY WITH PROPOFOL;  Surgeon: Charlott Rakes, MD;  Location: [facility];  Service: Gastroenterology;  Laterality: N/A;   CORONARY ANGIOPLASTY  1992   ERCP W/ SPHICTEROTOMY  12/15/2002   LAPAROSCOPIC CHOLECYSTECTOMY  12/16/2002   LEFT HEART CATH AND CORONARY ANGIOGRAPHY N/A 09/22/2022    Procedure: LEFT HEART CATH AND CORONARY ANGIOGRAPHY;  Surgeon: Tonny Bollman, MD;  Location[facility] INVASIVE CV LAB;  Service: Cardiovascular;  Laterality: N/A;   LUMBAR LAMINECTOMY     L5   POLYPECTOMY  02/10/2022   Procedure: POLYPECTOMY;  Surgeon: Charlott Rakes, MD;  Location: [facility];  Service: Gastroenterology;;   REVERSE SHOULDER ARTHROPLASTY Right 01/09/2022   Procedure: REVERSE SHOULDER ARTHROPLASTY;  Surgeon: Yolonda Kida, MD;  Location: [facility];  Service: Orthopedics;  Laterality: Right;  150   RIGHT SHOULDER SURGERY      SKIN CANCER EXCISION     TEE WITHOUT CARDIOVERSION N/A 02/28/2016   Procedure: TRANSESOPHAGEAL ECHOCARDIOGRAM (TEE);  Surgeon: Pricilla Riffle, MD;  Location: [facility] ENDOSCOPY;  Service: Cardiovascular;  Laterality: N/A;   TOTAL HIP ARTHROPLASTY Left 07/23/2021   Procedure: TOTAL HIP ARTHROPLASTY ANTERIOR APPROACH;  Surgeon: Ollen Gross, MD;  Location: [facility];  Service: Orthopedics;  Laterality: Left;    Family History  Problem Relation Age of Onset   Other Mother        AGE 30 HEALTHY   Heart attack Father 57       2 MI   Heart disease Maternal Grandfather    Heart disease Paternal Grandfather    Emphysema Other    Other Brother     Medications- reviewed and updated Current Outpatient Medications  Medication Sig Dispense Refill   amiodarone (PACERONE) 200 MG tablet TAKE 1 TABLET (200 MG TOTAL) BY MOUTH DAILY. \ 90 tablet 3   aspirin EC 81 MG tablet Take 1 tablet (81 mg total) by mouth daily. Swallow whole. 30 tablet 12   atorvastatin (LIPITOR) 40 MG tablet Take 1 tablet (40 mg total) by mouth daily. 90 tablet 3   Carboxymethylcellul-Glycerin (LUBRICATING EYE DROPS OP) Place 1 drop into both eyes daily as needed (dry eyes).     clobetasol ointment (TEMOVATE) 0.05 % APPLY TO AFFECTED AREA TWICE A DAY 30 g 2   clopidogrel (PLAVIX) 75 MG tablet Take 1 tablet (75 mg total) by mouth daily. 90 tablet 3   Evolocumab (REPATHA SURECLICK) 140 MG/ML SOAJ  Inject 140 mg into the skin every 14 (fourteen) days. 6 mL 3   isosorbide mononitrate (IMDUR) 30 MG 24 hr tablet Take 1 tablet (30 mg total) by mouth daily. 30 tablet 1   ketoconazole (NIZORAL) 2 % shampoo APPLY 1 TO 3 TIMES A WEEK TO SCALP. LET SIT 10 MIN THEN RINSE OUT. STRENGTH: 2 % (Patient taking differently: Apply 1 Application topically 3 (three) times a week. APPLY 1 TO 3 TIMES A WEEK TO SCALP. LET SIT 10 MIN THEN RINSE OUT. STRENGTH: 2 %) 120 mL 3   lisinopril (ZESTRIL) 10 MG tablet TAKE 1 TABLET BY MOUTH DAILY. PLEASE KEEP UPCOMING APPOINTMENT WITH DR. Katrinka Blazing IN APRIL 2023 90 tablet 3   metoprolol succinate (TOPROL-XL) 50 MG 24 hr tablet Take 1 tablet (50 mg total) by mouth daily. Take with or immediately following a meal. 90 tablet 3   nitroGLYCERIN (NITROSTAT)  0.4 MG SL tablet Place 1 tablet (0.4 mg total) under the tongue every 5 (five) minutes x 3 doses as needed for chest pain. 25 tablet 3   Study - [facility] - selatogrel 16 mg/0.5 mL or placebo SQ injection (PI-Christopher) Inject 16 mg into the skin once. For Investigational Use Only. Inject 0.5 mL subcutaneously in the abdomen or thigh if you experience symptoms of a heart attack. Call 911 immediately and seek emergency medical. Please contact [facility] for any questions or concerns regarding this medication.     valACYclovir (VALTREX) 1000 MG tablet Take 2 tablets by mouth daily. If having a flare up, pt will take an additional tablet.     No current facility-administered medications for this visit.    Allergies-reviewed and updated Allergies  Allergen Reactions   Eliquis [Apixaban] Other (See Comments)    Recurrent GI bleeding occurred from diverticulosis in the setting of anticoagulation therapy for atrial fibrillation.   Food Other (See Comments)    Pt is allergic to melons caused scratchy throat    Glutamic Acid Other (See Comments)    Pt is allergic to melons caused scratchy throat    Grass  Pollen(K-O-R-T-Swt Vern)     Sneezing    Ciprofloxacin Hives   Pollen Extract Other (See Comments)    Sneezing     Social History   Socioeconomic History   Marital status: Divorced    Spouse name: Not on file   Number of children: Not on file   Years of education: Not on file   Highest education level: Not on file  Occupational History   Occupation: sales  Tobacco Use   Smoking status: Former    Current packs/day: 0.00    Average packs/day: 2.0 packs/day for 20.0 years (40.0 ttl pk-yrs)    Types: Cigarettes    Start date: 03/27/1970    Quit date: 03/27/1990    Years since quitting: 32.5   Smokeless tobacco: Never  Vaping Use   Vaping status: Never Used  Substance and Sexual Activity   Alcohol use: Yes    Comment: Social   Drug use: No   Sexual activity: Not Currently  Other Topics Concern   Not on file  Social History Narrative   Not on file   Social Determinants of Health   Financial Resource Strain: Low Risk  (10/09/2021)   Overall Financial Resource Strain (CARDIA)    Difficulty of Paying Living Expenses: Not hard at all  Food Insecurity: No Food Insecurity (09/24/2022)   Hunger Vital Sign    Worried About Running Out of Food in the Last Year: Never true    Ran Out of Food in the Last Year: Never true  Transportation Needs: No Transportation Needs (09/24/2022)   PRAPARE - Administrator, Civil Service (Medical): No    Lack of Transportation (Non-Medical): No  Physical Activity: Insufficiently Active (10/09/2021)   Exercise Vital Sign    Days of Exercise per Week: 2 days    Minutes of Exercise per Session: 40 min  Stress: No Stress Concern Present (10/09/2021)   [employer] of Occupational Health - Occupational Stress Questionnaire    Feeling of Stress : Not at all  Social Connections: Socially Isolated (10/09/2021)   Social Connection and Isolation Panel [NHANES]    Frequency of Communication with Friends and Family: More than three times a week     Frequency of Social Gatherings with Friends and Family: More than three times a week  Attends Religious Services: Never    Active Member of Clubs or Organizations: No    Attends Banker Meetings: Never    Marital Status: Divorced        Objective:  Physical Exam: BP 123/69   Pulse 75   Temp 98 F (36.7 C) (Temporal)   Ht 6\' 2"  (1.88 m)   Wt 215 lb 12.8 oz (97.9 kg)   SpO2 97%   BMI 27.71 kg/m   Gen: No acute distress, resting comfortably CV: Regular rate and rhythm with no murmurs appreciated Pulm: Normal work of breathing, clear to auscultation bilaterally with no crackles, wheezes, or rhonchi Neuro: Grossly normal, moves all extremities Psych: Normal affect and thought content  Time Spent: 45 minutes of total time was spent on the date of the encounter performing the following actions: chart review prior to seeing the patient including recent hospitalization will use walker for, obtaining history, performing a medically necessary exam, counseling on the treatment plan, placing orders, and documenting in our EHR.        Katina Degree. Jimmey Ralph, MD 10/02/2022 9:14 AM

## 2022-10-02 NOTE — Assessment & Plan Note (Signed)
Blood pressure at goal today on current regimen metoprolol succinate 50 mg daily and lisinopril 10 mg daily.

## 2022-10-12 DIAGNOSIS — Z006 Encounter for examination for normal comparison and control in clinical research program: Secondary | ICD-10-CM

## 2022-10-12 NOTE — Research (Addendum)
[id number], [facility]     FOLLOW-UP PHONE CALLS  SUBJECT ID:  [id number] Trainer's name: Brittani Purdum Trainer's signature: on Delegation of Authority Log Date of the Follow up call:    12-Oct-2022  Visit Number: 2 Start time of the Follow up call:  1000           End time of call: 1010    Q1;  Was the follow up phone call done with the subject?  [x]   YES  []   NO           If NO, check the following:      Q2:    Was the follow up phone call done with a family member               Or caregiver?    []   YES   [x]   NO          Make note about reasons ___________________________________    AUTOINJECTOR LABEL:  Still legible?  [x]   Yes  []   No  - MEDICAL CONDITION      Q3:  Did any of the following occur?   []   Death       []   Hospitalization (any cause)         []   Use of autoinjector  Make note about the type of event, and when it occurred. In case of hospitalization: the Location of the hospital and/or the treating physician's contact details  ____________ ____________________________________________________________________ ____________________________________________________________________  Note: remember to report any SAE/AE which has occurred within 30 days after any  injection of the study drug on relevant eCRF forms.   Q4:  Did the subject develop any condition which is an          exclusion criterion?  []   YES  [x]   NO    Q5: Was there any change in subject's antithrombotic therapy?   []   YES  [x]   NO      - RECOLLECTION OF THE STUDY-SPECFIC TRAINING  Q6: Did the subject correctly reply to the following questions?       A.  What are common heart attack symptoms?      [x]   YES   []   NO      B.  What has to be done in case any of those symptoms occurs? [x]   YES  []   NO      C.  What are the main steps to perform a self-injection?  [x]   YES  []   NO             If NO, then report which step/s was/were missing:        []   Choose injection site  (abdomen or thigh)       []   Twist cap off       []   Pinch skin and place the study autoinjector       []   Firmly push down and hold for 3 seconds       D. What has to be done immediately after an injection?  [x]   YES   []   NO          If NO, then report which step/s was/were missing:       []   Call  for emergency medical help       []   Show the autoinjector to the emergency medical responder       E.  Does the subject recall where s/he keeps/ stores the  autoinjectors? [x]  YES  []  NO          Note the place of storage and any corrective explanation if needed below __________________________________________________________________ __________________________________________________________________  - TRAINING REFRESHER   Q7:  Is a training refresher needed?      []  YES  [x]  NO        If YES, indicate items that have to be refreshed. More than one may apply:               []   Heart attack symptoms             []   Actions to be taken following heart attack symptoms             []   Steps to perform the self-injection and follow-up actions to be taken   []   Other, Specify  _________________________________________        Current Outpatient Medications:    amiodarone (PACERONE) 200 MG tablet, TAKE 1 TABLET (200 MG TOTAL) BY MOUTH DAILY. \, Disp: 90 tablet, Rfl: 3   aspirin EC 81 MG tablet, Take 1 tablet (81 mg total) by mouth daily. Swallow whole., Disp: 30 tablet, Rfl: 12   atorvastatin (LIPITOR) 40 MG tablet, Take 1 tablet (40 mg total) by mouth daily., Disp: 90 tablet, Rfl: 3   Carboxymethylcellul-Glycerin (LUBRICATING EYE DROPS OP), Place 1 drop into both eyes daily as needed (dry eyes)., Disp: , Rfl:    clobetasol ointment (TEMOVATE) 0.05 %, APPLY TO AFFECTED AREA TWICE A DAY, Disp: 30 g, Rfl: 2   clopidogrel (PLAVIX) 75 MG tablet, Take 1 tablet (75 mg total) by mouth daily., Disp: 90 tablet, Rfl: 3   Evolocumab (REPATHA SURECLICK) 140 MG/ML SOAJ, Inject 140 mg into the skin every  14 (fourteen) days., Disp: 6 mL, Rfl: 3   isosorbide mononitrate (IMDUR) 30 MG 24 hr tablet, Take 1 tablet (30 mg total) by mouth daily., Disp: 30 tablet, Rfl: 1   ketoconazole (NIZORAL) 2 % shampoo, APPLY 1 TO 3 TIMES A WEEK TO SCALP. LET SIT 10 MIN THEN RINSE OUT. STRENGTH: 2 % (Patient taking differently: Apply 1 Application topically 3 (three) times a week. APPLY 1 TO 3 TIMES A WEEK TO SCALP. LET SIT 10 MIN THEN RINSE OUT. STRENGTH: 2 %), Disp: 120 mL, Rfl: 3   lisinopril (ZESTRIL) 10 MG tablet, TAKE 1 TABLET BY MOUTH DAILY. PLEASE KEEP UPCOMING APPOINTMENT WITH DR. Katrinka Blazing IN APRIL 2023, Disp: 90 tablet, Rfl: 3   metoprolol succinate (TOPROL-XL) 50 MG 24 hr tablet, Take 1 tablet (50 mg total) by mouth daily. Take with or immediately following a meal., Disp: 90 tablet, Rfl: 3   nitroGLYCERIN (NITROSTAT) 0.4 MG SL tablet, Place 1 tablet (0.4 mg total) under the tongue every 5 (five) minutes x 3 doses as needed for chest pain., Disp: 25 tablet, Rfl: 3   Study - [facility] - selatogrel 16 mg/0.5 mL or placebo SQ injection (PI-Christopher), Inject 16 mg into the skin once. For Investigational Use Only. Inject 0.5 mL subcutaneously in the abdomen or thigh if you experience symptoms of a heart attack. Call 911 immediately and seek emergency medical. Please contact [facility] for any questions or concerns regarding this medication., Disp: , Rfl:    valACYclovir (VALTREX) 1000 MG tablet, Take 2 tablets by mouth daily. If having a flare up, pt will take an additional tablet., Disp: , Rfl:

## 2022-10-14 DIAGNOSIS — Z23 Encounter for immunization: Secondary | ICD-10-CM | POA: Diagnosis not present

## 2022-10-15 ENCOUNTER — Ambulatory Visit: Payer: Medicare Other

## 2022-10-15 VITALS — Wt 215.0 lb

## 2022-10-15 DIAGNOSIS — Z0000 Encounter for general adult medical examination without abnormal findings: Secondary | ICD-10-CM

## 2022-10-15 NOTE — Patient Instructions (Signed)
Jonathan Holden , Thank you for taking time to come for your Medicare Wellness Visit. I appreciate your ongoing commitment to your health goals. Please review the following plan we discussed and let me know if I can assist you in the future.   Referrals/Orders/Follow-Ups/Clinician Recommendations: None at this time   Each day, aim for 6 glasses of water, plenty of protein in your diet and try to get up and walk/ stretch every hour for 5-10 minutes at a time.    This is a list of the screening recommended for you and due dates:  Health Maintenance  Topic Date Due   DTaP/Tdap/Td vaccine (1 - Tdap) Never done   Pneumonia Vaccine (1 of 1 - PCV) Never done   Zoster (Shingles) Vaccine (2 of 2) 10/25/2022   Medicare Annual Wellness Visit  10/15/2023   Flu Shot  Completed   COVID-19 Vaccine  Completed   Hepatitis C Screening  Completed   HPV Vaccine  Aged Out   Colon Cancer Screening  Discontinued    Advanced directives: (Copy Requested) Please bring a copy of your health care power of attorney and living will to the office to be added to your chart at your convenience.  Next Medicare Annual Wellness Visit scheduled for next year: Yes

## 2022-10-15 NOTE — Progress Notes (Signed)
Subjective:   Jonathan Holden is a 77 y.o. male who presents for Medicare Annual/Subsequent preventive examination.  Visit Complete: Virtual  I connected with  Jonathan Holden on 10/15/22 by a audio enabled telemedicine application and verified that I am speaking with the correct person using two identifiers.  Patient Location: Home  Provider Location: Office/Clinic  I discussed the limitations of evaluation and management by telemedicine. The patient expressed understanding and agreed to proceed.  Patient Medicare AWV questionnaire was completed by the patient on 10/14/22; I have confirmed that all information answered by patient is correct and no changes since this date.  Because this visit was a virtual/telehealth visit, some criteria may be missing or patient reported. Any vitals not documented were not able to be obtained and vitals that have been documented are patient reported.    Cardiac Risk Factors include: dyslipidemia;hypertension;male gender     Objective:    Today's Vitals   10/15/22 0836  Weight: 215 lb (97.5 kg)   Body mass index is 27.6 kg/m.     10/15/2022    8:44 AM 09/21/2022   11:09 AM 09/19/2022    8:04 AM 02/10/2022    9:30 AM 12/25/2021    8:57 AM 10/09/2021    9:11 AM 09/10/2021   11:01 AM  Advanced Directives  Does Patient Have a Medical Advance Directive? Yes  No Yes Yes Yes Yes  Type of Estate agent of [place];Living will   Healthcare Power of [place];Living will Healthcare Power of [employer] of [place];Living will Healthcare Power of Attorney  Copy of Healthcare Power of Attorney in Chart? No - copy requested   Yes - validated most recent copy scanned in chart (See row information) Yes - validated most recent copy scanned in chart (See row information) No - copy requested No - copy requested  Would patient like information on creating a medical advance directive?  No - Patient declined         Current  Medications (verified) Outpatient Encounter Medications as of 10/15/2022  Medication Sig   amiodarone (PACERONE) 200 MG tablet TAKE 1 TABLET (200 MG TOTAL) BY MOUTH DAILY. \   aspirin EC 81 MG tablet Take 1 tablet (81 mg total) by mouth daily. Swallow whole.   atorvastatin (LIPITOR) 40 MG tablet Take 1 tablet (40 mg total) by mouth daily.   Carboxymethylcellul-Glycerin (LUBRICATING EYE DROPS OP) Place 1 drop into both eyes daily as needed (dry eyes).   clobetasol ointment (TEMOVATE) 0.05 % APPLY TO AFFECTED AREA TWICE A DAY   clopidogrel (PLAVIX) 75 MG tablet Take 1 tablet (75 mg total) by mouth daily.   Evolocumab (REPATHA SURECLICK) 140 MG/ML SOAJ Inject 140 mg into the skin every 14 (fourteen) days.   isosorbide mononitrate (IMDUR) 30 MG 24 hr tablet Take 1 tablet (30 mg total) by mouth daily.   ketoconazole (NIZORAL) 2 % shampoo APPLY 1 TO 3 TIMES A WEEK TO SCALP. LET SIT 10 MIN THEN RINSE OUT. STRENGTH: 2 % (Patient taking differently: Apply 1 Application topically 3 (three) times a week. APPLY 1 TO 3 TIMES A WEEK TO SCALP. LET SIT 10 MIN THEN RINSE OUT. STRENGTH: 2 %)   lisinopril (ZESTRIL) 10 MG tablet TAKE 1 TABLET BY MOUTH DAILY. PLEASE KEEP UPCOMING APPOINTMENT WITH DR. Katrinka Blazing IN APRIL 2023   metoprolol succinate (TOPROL-XL) 50 MG 24 hr tablet Take 1 tablet (50 mg total) by mouth daily. Take with or immediately following a meal.  nitroGLYCERIN (NITROSTAT) 0.4 MG SL tablet Place 1 tablet (0.4 mg total) under the tongue every 5 (five) minutes x 3 doses as needed for chest pain.   SHINGRIX injection    Study - SOS-AMI - selatogrel 16 mg/0.5 mL or placebo SQ injection (PI-Christopher) Inject 16 mg into the skin once. For Investigational Use Only. Inject 0.5 mL subcutaneously in the abdomen or thigh if you experience symptoms of a heart attack. Call 911 immediately and seek emergency medical. Please contact [facility] for any questions or concerns regarding this medication.    valACYclovir (VALTREX) 1000 MG tablet Take 2 tablets by mouth daily. If having a flare up, pt will take an additional tablet.   No facility-administered encounter medications on file as of 10/15/2022.    Allergies (verified) Eliquis [apixaban], Food, Glutamic acid, Grass pollen([id number]-swt vern), Ciprofloxacin, and Pollen extract   History: Past Medical History:  Diagnosis Date   Acute lower GI bleeding 03/19/2016   Arthritis    Atrial fibrillation with RVR (HCC) 02/21/2016   Coronary artery disease    Diverticulosis 04/18/2016   Dysrhythmia    AFIB   Endocarditis of native valve    Former smoker    History of MI (myocardial infarction) 1992   Hyperlipidemia    Hypertension    Infection of intervertebral disc (HCC)    Insomnia 04/18/2016   Left retinal detachment 03/18/2015   Loose body of right shoulder    Myocardial infarction [facility])    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   Pseudophakia of left eye 05/22/2013   Pseudophakia of right eye 06/05/2013   S/P coronary angioplasty    Skin cancer    Hands, arms, nose   Streptococcal bacteremia    Past Surgical History:  Procedure Laterality Date   CARDIOVASCULAR STRESS TEST  08/22/2008   CATARACT EXTRACTION W/ INTRAOCULAR LENS IMPLANT Bilateral    COLONOSCOPY WITH PROPOFOL N/A 02/10/2022   Procedure: COLONOSCOPY WITH PROPOFOL;  Surgeon: Charlott Rakes, MD;  Location: [facility] ENDOSCOPY;  Service: Gastroenterology;  Laterality: N/A;   CORONARY ANGIOPLASTY  1992   ERCP W/ SPHICTEROTOMY  12/15/2002   LAPAROSCOPIC CHOLECYSTECTOMY  12/16/2002   LEFT HEART CATH AND CORONARY ANGIOGRAPHY N/A 09/22/2022   Procedure: LEFT HEART CATH AND CORONARY ANGIOGRAPHY;  Surgeon: Tonny Bollman, MD;  Location: [facility] INVASIVE CV LAB;  Service: Cardiovascular;  Laterality: N/A;   LUMBAR LAMINECTOMY     L5   POLYPECTOMY  02/10/2022   Procedure: POLYPECTOMY;  Surgeon: Charlott Rakes, MD;  Location: [facility] ENDOSCOPY;  Service:  Gastroenterology;;   REVERSE SHOULDER ARTHROPLASTY Right 01/09/2022   Procedure: REVERSE SHOULDER ARTHROPLASTY;  Surgeon: Yolonda Kida, MD;  Location: [facility] ORS;  Service: Orthopedics;  Laterality: Right;  150   RIGHT SHOULDER SURGERY      SKIN CANCER EXCISION     TEE WITHOUT CARDIOVERSION N/A 02/28/2016   Procedure: TRANSESOPHAGEAL ECHOCARDIOGRAM (TEE);  Surgeon: Pricilla Riffle, MD;  Location: [facility] ENDOSCOPY;  Service: Cardiovascular;  Laterality: N/A;   TOTAL HIP ARTHROPLASTY Left 07/23/2021   Procedure: TOTAL HIP ARTHROPLASTY ANTERIOR APPROACH;  Surgeon: Ollen Gross, MD;  Location: [facility] ORS;  Service: Orthopedics;  Laterality: Left;   Family History  Problem Relation Age of Onset   Other Mother        AGE 41 HEALTHY   Heart attack Father 51       2 MI   Heart disease Maternal Grandfather    Heart disease Paternal Grandfather    Emphysema Other  Other Brother    Social History   Socioeconomic History   Marital status: Divorced    Spouse name: Not on file   Number of children: Not on file   Years of education: Not on file   Highest education level: Not on file  Occupational History   Occupation: sales  Tobacco Use   Smoking status: Former    Current packs/day: 0.00    Average packs/day: 2.0 packs/day for 20.0 years (40.0 ttl pk-yrs)    Types: Cigarettes    Start date: 03/27/1970    Quit date: 03/27/1990    Years since quitting: 32.5   Smokeless tobacco: Never  Vaping Use   Vaping status: Never Used  Substance and Sexual Activity   Alcohol use: Yes    Comment: Social   Drug use: No   Sexual activity: Not Currently  Other Topics Concern   Not on file  Social History Narrative   Not on file   Social Determinants of Health   Financial Resource Strain: Low Risk  (10/14/2022)   Overall Financial Resource Strain (CARDIA)    Difficulty of Paying Living Expenses: Not hard at all  Food Insecurity: No Food Insecurity (10/14/2022)   Hunger Vital Sign    Worried About  Running Out of Food in the Last Year: Never true    Ran Out of Food in the Last Year: Never true  Transportation Needs: No Transportation Needs (10/14/2022)   PRAPARE - Administrator, Civil Service (Medical): No    Lack of Transportation (Non-Medical): No  Physical Activity: Inactive (10/14/2022)   Exercise Vital Sign    Days of Exercise per Week: 0 days    Minutes of Exercise per Session: 0 min  Stress: No Stress Concern Present (10/14/2022)   [employer] of Occupational Health - Occupational Stress Questionnaire    Feeling of Stress : Not at all  Social Connections: Socially Isolated (10/14/2022)   Social Connection and Isolation Panel [NHANES]    Frequency of Communication with Friends and Family: Twice a week    Frequency of Social Gatherings with Friends and Family: Once a week    Attends Religious Services: Never    Database administrator or Organizations: No    Attends Engineer, structural: Never    Marital Status: Divorced    Tobacco Counseling Counseling given: Not Answered   Clinical Intake:  Pre-visit preparation completed: Yes  Pain : No/denies pain     BMI - recorded: 27.6 Nutritional Status: BMI 25 -29 Overweight Diabetes: No  How often do you need to have someone help you when you read instructions, pamphlets, or other written materials from your doctor or pharmacy?: 1 - Never  Interpreter Needed?: No  Information entered by :: Lanier Ensign, LPN   Activities of Daily Living    10/14/2022   11:48 AM 09/19/2022    2:52 PM  In your present state of health, do you have any difficulty performing the following activities:  Hearing? 0   Vision? 1   Difficulty concentrating or making decisions? 0   Walking or climbing stairs? 1   Dressing or bathing? 0   Doing errands, shopping? 0 0  Preparing Food and eating ? N   Using the Toilet? N   In the past six months, have you accidently leaked urine? N   Do you have problems with  loss of bowel control? N   Managing your Medications? N   Managing your Finances? N  Housekeeping or managing your Housekeeping? N     Patient Care Team: Ardith Dark, MD as PCP - General (Family Medicine) Regan Lemming, MD as PCP - Electrophysiology (Cardiology) Nahser, Deloris Ping, MD as PCP - Cardiology (Cardiology) System, Provider Not In (Oral Surgery) Coletta Memos, MD as Consulting Physician (Neurosurgery) System, Provider Not In (Dentistry) Judyann Munson, MD as Consulting Physician (Infectious Diseases) Ranee Gosselin, MD as Consulting Physician (Orthopedic Surgery) Laurine Blazer, MD as Referring Physician (Dermatology) Erroll Luna, [facility] (Inactive) as Pharmacist (Pharmacist)  Indicate any recent Medical Services you may have received from other than [facility] providers in the past year (date may be approximate).     Assessment:   This is a routine wellness examination for [place].  Hearing/Vision screen Hearing Screening - Comments:: Pt denies any hearing issues  Vision Screening - Comments:: Pt follows up with [facility]    Goals Addressed             This Visit's Progress    Patient Stated       None at this time        Depression Screen    10/15/2022    8:45 AM 10/02/2022    8:36 AM 03/31/2022   10:54 AM 10/09/2021    9:08 AM 10/01/2021    8:46 AM 09/27/2020    8:12 AM 07/25/2020    9:46 AM  PHQ 2/9 Scores  PHQ - 2 Score 0 0 0 0 0 0 0    Fall Risk    10/14/2022   11:48 AM 10/02/2022    8:36 AM 03/31/2022   10:54 AM 10/09/2021    9:10 AM 10/01/2021    8:46 AM  Fall Risk   Falls in the past year? 0 0 0 0 1  Number falls in past yr:  0 0 0 1  Injury with Fall?  0 0 0 0  Risk for fall due to : Impaired vision No Fall Risks No Fall Risks No Fall Risks No Fall Risks  Follow up Falls prevention discussed   Falls prevention discussed     MEDICARE RISK AT HOME: Medicare Risk at Home Any stairs in or around the home?: Yes If so, are there  any without handrails?: Yes Home free of loose throw rugs in walkways, pet beds, electrical cords, etc?: No Adequate lighting in your home to reduce risk of falls?: Yes Life alert?: No Use of a cane, walker or w/c?: No Grab bars in the bathroom?: No Shower chair or bench in shower?: No Elevated toilet seat or a handicapped toilet?: Yes  TIMED UP AND GO:  Was the test performed?  No    Cognitive Function:        10/15/2022    8:50 AM 09/27/2020    8:17 AM 09/21/2019    8:18 AM  6CIT Screen  What Year? 0 points 0 points 0 points  What month? 0 points 0 points 0 points  What time? 0 points 0 points   Count back from 20 0 points 0 points 0 points  Months in reverse 0 points 0 points 0 points  Repeat phrase 2 points 0 points 0 points  Total Score 2 points 0 points     Immunizations Immunization History  Administered Date(s) Administered   Fluad Quad(high Dose 65+) 10/26/2018, 10/10/2021   Influenza, High Dose Seasonal PF 12/31/2016, 12/10/2017   Influenza-Unspecified 11/20/2015, 10/14/2022   Moderna Sars-Covid-2 Vaccination 11/11/2019, 07/06/2020   PFIZER(Purple Top)SARS-COV-2 Vaccination 02/27/2019, 03/24/2019  Research officer, trade union 84yrs & up 12/05/2020   Zoster Recombinant(Shingrix) 08/30/2022    TDAP status: Due, Education has been provided regarding the importance of this vaccine. Advised may receive this vaccine at local pharmacy or Health Dept. Aware to provide a copy of the vaccination record if obtained from local pharmacy or Health Dept. Verbalized acceptance and understanding.  Flu Vaccine status: Up to date  Pneumococcal vaccine status: Due, Education has been provided regarding the importance of this vaccine. Advised may receive this vaccine at local pharmacy or Health Dept. Aware to provide a copy of the vaccination record if obtained from local pharmacy or Health Dept. Verbalized acceptance and understanding.  Covid-19 vaccine status:  Completed vaccines  Qualifies for Shingles Vaccine? Yes   Zostavax completed Yes   Shingrix Completed?: No.    Education has been provided regarding the importance of this vaccine. Patient has been advised to call insurance company to determine out of pocket expense if they have not yet received this vaccine. Advised may also receive vaccine at local pharmacy or Health Dept. Verbalized acceptance and understanding.  Screening Tests Health Maintenance  Topic Date Due   DTaP/Tdap/Td (1 - Tdap) Never done   Pneumonia Vaccine 62+ Years old (1 of 1 - PCV) Never done   Zoster Vaccines- Shingrix (2 of 2) 10/25/2022   Medicare Annual Wellness (AWV)  10/15/2023   INFLUENZA VACCINE  Completed   COVID-19 Vaccine  Completed   Hepatitis C Screening  Completed   HPV VACCINES  Aged Out   Colonoscopy  Discontinued    Health Maintenance  Health Maintenance Due  Topic Date Due   DTaP/Tdap/Td (1 - Tdap) Never done   Pneumonia Vaccine 15+ Years old (1 of 1 - PCV) Never done    Colorectal cancer screening: No longer required.    Additional Screening:  Hepatitis C Screening:  Completed 02/26/16  Vision Screening: Recommended annual ophthalmology exams for early detection of glaucoma and other disorders of the eye. Is the patient up to date with their annual eye exam?  Yes  Who is the provider or what is the name of the office in which the patient attends annual eye exams? [facility]  If pt is not established with a provider, would they like to be referred to a provider to establish care? No .   Dental Screening: Recommended annual dental exams for proper oral hygiene   Community Resource Referral / Chronic Care Management: CRR required this visit?  No   CCM required this visit?  No     Plan:     I have personally reviewed and noted the following in the patient's chart:   Medical and social history Use of alcohol, tobacco or illicit drugs  Current medications and supplements including  opioid prescriptions. Patient is not currently taking opioid prescriptions. Functional ability and status Nutritional status Physical activity Advanced directives List of other physicians Hospitalizations, surgeries, and ER visits in previous 12 months Vitals Screenings to include cognitive, depression, and falls Referrals and appointments  In addition, I have reviewed and discussed with patient certain preventive protocols, quality metrics, and best practice recommendations. A written personalized care plan for preventive services as well as general preventive health recommendations were provided to patient.     Marzella Schlein, LPN   4/78/2956   After Visit Summary: (MyChart) Due to this being a telephonic visit, the after visit summary with patients personalized plan was offered to patient via MyChart   Nurse Notes:  none

## 2022-10-22 ENCOUNTER — Other Ambulatory Visit: Payer: Self-pay | Admitting: Nurse Practitioner

## 2022-10-29 DIAGNOSIS — Z006 Encounter for examination for normal comparison and control in clinical research program: Secondary | ICD-10-CM

## 2022-10-29 NOTE — Research (Signed)
[id number], [id number]     FOLLOW-UP PHONE CALLS  SUBJECT ID:  [id number] Trainer's name: Dutch Quint Trainer's signature: on Delegation of Authority Log Date of the Follow up call:    29-Oct-2022  Visit Number: 3 Start time of the Follow up call:   1400              End time of call: 1415   Q1;  Was the follow up phone call done with the subject?  [x]   YES  []   NO           If NO, check the following:      Q2:    Was the follow up phone call done with a family member               Or caregiver?    []   YES   [x]   NO          Make note about reasons ___________________________________    AUTOINJECTOR LABEL:  Still legible?  [x]   Yes  []   No  - MEDICAL CONDITION      Q3:  Did any of the following occur?   []   Death       []   Hospitalization (any cause)         []   Use of autoinjector  Make note about the type of event, and when it occurred. In case of hospitalization: the Location of the hospital and/or the treating physician's contact details  ____________   Note: remember to report any SAE/AE which has occurred within 30 days after any  injection of the study drug on relevant eCRF forms.   Q4:  Did the subject develop any condition which is an          exclusion criterion?  []   YES  [x]   NO   Take note about the occurrence of any exclusion criterion after randomization: _________________________________________________________________ __________________________________________________________________  Q5: Was there any change in subject's antithrombotic therapy?   []   YES  [x]   NO     Take note about any change of antithrombotic treatment: _______________________  - RECOLLECTION OF THE STUDY-SPECFIC TRAINING  Q6: Did the subject correctly reply to the following questions?       A.  What are common heart attack symptoms?      [x]   YES   []   NO      B.  What has to be done in case any of those symptoms occurs? [x]   YES  []   NO      C.  What are the  main steps to perform a self-injection?  [x]   YES  []   NO             If NO, then report which step/s was/were missing:        []   Choose injection site (abdomen or thigh)       []   Twist cap off       []   Pinch skin and place the study autoinjector       []   Firmly push down and hold for 3 seconds       D. What has to be done immediately after an injection?  [x]   YES   []   NO          If NO, then report which step/s was/were missing:       []   Call  for emergency medical help       []   Show the  autoinjector to the emergency medical responder       E.  Does the subject recall where s/he keeps/ stores the autoinjectors? [x]  YES  []  NO          Note the place of storage and any corrective explanation if needed below __________________________________________________________________ __________________________________________________________________  - TRAINING REFRESHER   Q7:  Is a training refresher needed?      []  YES  [x]  NO        If YES, indicate items that have to be refreshed. More than one may apply:               []   Heart attack symptoms             []   Actions to be taken following heart attack symptoms             []   Steps to perform the self-injection and follow-up actions to be taken   []   Other, Specify  _________________________________________        Current Outpatient Medications:    amiodarone (PACERONE) 200 MG tablet, TAKE 1 TABLET (200 MG TOTAL) BY MOUTH DAILY. \, Disp: 90 tablet, Rfl: 3   aspirin EC 81 MG tablet, Take 1 tablet (81 mg total) by mouth daily. Swallow whole., Disp: 30 tablet, Rfl: 12   atorvastatin (LIPITOR) 40 MG tablet, Take 1 tablet (40 mg total) by mouth daily., Disp: 90 tablet, Rfl: 3   Carboxymethylcellul-Glycerin (LUBRICATING EYE DROPS OP), Place 1 drop into both eyes daily as needed (dry eyes)., Disp: , Rfl:    clobetasol ointment (TEMOVATE) 0.05 %, APPLY TO AFFECTED AREA TWICE A DAY, Disp: 30 g, Rfl: 2   clopidogrel (PLAVIX) 75 MG  tablet, Take 1 tablet (75 mg total) by mouth daily., Disp: 90 tablet, Rfl: 3   Evolocumab (REPATHA SURECLICK) 140 MG/ML SOAJ, Inject 140 mg into the skin every 14 (fourteen) days., Disp: 6 mL, Rfl: 3   isosorbide mononitrate (IMDUR) 30 MG 24 hr tablet, TAKE 1 TABLET BY MOUTH EVERY DAY, Disp: 90 tablet, Rfl: 3   ketoconazole (NIZORAL) 2 % shampoo, APPLY 1 TO 3 TIMES A WEEK TO SCALP. LET SIT 10 MIN THEN RINSE OUT. STRENGTH: 2 % (Patient taking differently: Apply 1 Application topically 3 (three) times a week. APPLY 1 TO 3 TIMES A WEEK TO SCALP. LET SIT 10 MIN THEN RINSE OUT. STRENGTH: 2 %), Disp: 120 mL, Rfl: 3   lisinopril (ZESTRIL) 10 MG tablet, TAKE 1 TABLET BY MOUTH DAILY. PLEASE KEEP UPCOMING APPOINTMENT WITH DR. Katrinka Blazing IN APRIL 2023, Disp: 90 tablet, Rfl: 3   metoprolol succinate (TOPROL-XL) 50 MG 24 hr tablet, Take 1 tablet (50 mg total) by mouth daily. Take with or immediately following a meal., Disp: 90 tablet, Rfl: 3   nitroGLYCERIN (NITROSTAT) 0.4 MG SL tablet, Place 1 tablet (0.4 mg total) under the tongue every 5 (five) minutes x 3 doses as needed for chest pain., Disp: 25 tablet, Rfl: 3   SHINGRIX injection, , Disp: , Rfl:    Study - SOS-AMI - selatogrel 16 mg/0.5 mL or placebo SQ injection (PI-Christopher), Inject 16 mg into the skin once. For Investigational Use Only. Inject 0.5 mL subcutaneously in the abdomen or thigh if you experience symptoms of a heart attack. Call 911 immediately and seek emergency medical. Please contact [facility] for any questions or concerns regarding this medication., Disp: , Rfl:    valACYclovir (VALTREX) 1000 MG tablet, Take 2 tablets by mouth daily. If  having a flare up, pt will take an additional tablet., Disp: , Rfl:

## 2022-11-02 ENCOUNTER — Emergency Department (HOSPITAL_COMMUNITY): Payer: Medicare Other

## 2022-11-02 ENCOUNTER — Other Ambulatory Visit: Payer: Self-pay

## 2022-11-02 ENCOUNTER — Encounter (HOSPITAL_COMMUNITY): Payer: Self-pay

## 2022-11-02 ENCOUNTER — Observation Stay (HOSPITAL_COMMUNITY)
Admission: EM | Admit: 2022-11-02 | Discharge: 2022-11-03 | Disposition: A | Discharge status: Home / Self Care | Payer: Medicare Other | Attending: Emergency Medicine | Admitting: Emergency Medicine

## 2022-11-02 DIAGNOSIS — I480 Paroxysmal atrial fibrillation: Secondary | ICD-10-CM

## 2022-11-02 DIAGNOSIS — Z87891 Personal history of nicotine dependence: Secondary | ICD-10-CM | POA: Diagnosis not present

## 2022-11-02 DIAGNOSIS — Z79899 Other long term (current) drug therapy: Secondary | ICD-10-CM | POA: Diagnosis not present

## 2022-11-02 DIAGNOSIS — I25118 Atherosclerotic heart disease of native coronary artery with other forms of angina pectoris: Secondary | ICD-10-CM

## 2022-11-02 DIAGNOSIS — Z86718 Personal history of other venous thrombosis and embolism: Secondary | ICD-10-CM | POA: Diagnosis not present

## 2022-11-02 DIAGNOSIS — I10 Essential (primary) hypertension: Secondary | ICD-10-CM | POA: Insufficient documentation

## 2022-11-02 DIAGNOSIS — R55 Syncope and collapse: Secondary | ICD-10-CM | POA: Diagnosis not present

## 2022-11-02 DIAGNOSIS — Z7902 Long term (current) use of antithrombotics/antiplatelets: Secondary | ICD-10-CM | POA: Insufficient documentation

## 2022-11-02 DIAGNOSIS — J9811 Atelectasis: Secondary | ICD-10-CM | POA: Diagnosis not present

## 2022-11-02 DIAGNOSIS — K922 Gastrointestinal hemorrhage, unspecified: Secondary | ICD-10-CM | POA: Diagnosis not present

## 2022-11-02 DIAGNOSIS — Z96642 Presence of left artificial hip joint: Secondary | ICD-10-CM | POA: Diagnosis not present

## 2022-11-02 DIAGNOSIS — Z96611 Presence of right artificial shoulder joint: Secondary | ICD-10-CM | POA: Diagnosis not present

## 2022-11-02 DIAGNOSIS — S0990XA Unspecified injury of head, initial encounter: Secondary | ICD-10-CM | POA: Diagnosis not present

## 2022-11-02 DIAGNOSIS — S299XXA Unspecified injury of thorax, initial encounter: Secondary | ICD-10-CM | POA: Diagnosis not present

## 2022-11-02 DIAGNOSIS — K625 Hemorrhage of anus and rectum: Secondary | ICD-10-CM | POA: Diagnosis not present

## 2022-11-02 DIAGNOSIS — I2510 Atherosclerotic heart disease of native coronary artery without angina pectoris: Secondary | ICD-10-CM | POA: Diagnosis not present

## 2022-11-02 DIAGNOSIS — E785 Hyperlipidemia, unspecified: Secondary | ICD-10-CM | POA: Insufficient documentation

## 2022-11-02 DIAGNOSIS — D649 Anemia, unspecified: Secondary | ICD-10-CM | POA: Diagnosis not present

## 2022-11-02 DIAGNOSIS — Z23 Encounter for immunization: Secondary | ICD-10-CM | POA: Diagnosis not present

## 2022-11-02 DIAGNOSIS — I517 Cardiomegaly: Secondary | ICD-10-CM | POA: Diagnosis not present

## 2022-11-02 DIAGNOSIS — D62 Acute posthemorrhagic anemia: Secondary | ICD-10-CM

## 2022-11-02 DIAGNOSIS — Z7982 Long term (current) use of aspirin: Secondary | ICD-10-CM | POA: Diagnosis not present

## 2022-11-02 DIAGNOSIS — Z85828 Personal history of other malignant neoplasm of skin: Secondary | ICD-10-CM | POA: Diagnosis not present

## 2022-11-02 DIAGNOSIS — R0989 Other specified symptoms and signs involving the circulatory and respiratory systems: Secondary | ICD-10-CM | POA: Diagnosis not present

## 2022-11-02 LAB — COMPREHENSIVE METABOLIC PANEL
ALT: 19 U/L (ref 0–44)
AST: 19 U/L (ref 15–41)
Albumin: 3.4 g/dL — ABNORMAL LOW (ref 3.5–5.0)
Alkaline Phosphatase: 76 U/L (ref 38–126)
Anion gap: 10 (ref 5–15)
BUN: 16 mg/dL (ref 8–23)
CO2: 21 mmol/L — ABNORMAL LOW (ref 22–32)
Calcium: 9.1 mg/dL (ref 8.9–10.3)
Chloride: 107 mmol/L (ref 98–111)
Creatinine, Ser: 1.2 mg/dL (ref 0.61–1.24)
GFR, Estimated: >60 mL/min (ref >60)
Glucose, Bld: 107 mg/dL — ABNORMAL HIGH (ref 70–99)
Potassium: 4.1 mmol/L (ref 3.5–5.1)
Sodium: 138 mmol/L (ref 135–145)
Total Bilirubin: 0.7 mg/dL (ref 0.3–1.2)
Total Protein: 6.3 g/dL — ABNORMAL LOW (ref 6.5–8.1)

## 2022-11-02 LAB — CBC
HCT: 34.4 % — ABNORMAL LOW (ref 39.0–52.0)
HCT: 31.9 % — ABNORMAL LOW (ref 39.0–52.0)
Hemoglobin: 11.3 g/dL — ABNORMAL LOW (ref 13.0–17.0)
Hemoglobin: 10.5 g/dL — ABNORMAL LOW (ref 13.0–17.0)
MCH: 32.7 pg (ref 26.0–34.0)
MCH: 31.3 pg (ref 26.0–34.0)
MCHC: 32.8 g/dL (ref 30.0–36.0)
MCHC: 32.9 g/dL (ref 30.0–36.0)
MCV: 99.4 fL (ref 80.0–100.0)
MCV: 94.9 fL (ref 80.0–100.0)
Platelets: 185 10*3/uL (ref 150–400)
Platelets: 178 10*3/uL (ref 150–400)
RBC: 3.46 MIL/uL — ABNORMAL LOW (ref 4.22–5.81)
RBC: 3.36 MIL/uL — ABNORMAL LOW (ref 4.22–5.81)
RDW: 13.1 % (ref 11.5–15.5)
RDW: 12.9 % (ref 11.5–15.5)
WBC: 11.4 10*3/uL — ABNORMAL HIGH (ref 4.0–10.5)
WBC: 9.4 10*3/uL (ref 4.0–10.5)
nRBC: 0 % (ref 0.0–0.2)
nRBC: 0 % (ref 0.0–0.2)

## 2022-11-02 LAB — URINALYSIS, ROUTINE W REFLEX MICROSCOPIC
Bilirubin Urine: NEGATIVE
Glucose, UA: NEGATIVE mg/dL
Hgb urine dipstick: NEGATIVE
Ketones, ur: NEGATIVE mg/dL
Leukocytes,Ua: NEGATIVE
Nitrite: NEGATIVE
Protein, ur: NEGATIVE mg/dL
Specific Gravity, Urine: 1.013 (ref 1.005–1.030)
pH: 5 (ref 5.0–8.0)

## 2022-11-02 LAB — TROPONIN I (HIGH SENSITIVITY)
Troponin I (High Sensitivity): 5 ng/L (ref <18)
Troponin I (High Sensitivity): 6 ng/L (ref <18)

## 2022-11-02 LAB — PROTIME-INR
INR: 1.2 (ref 0.8–1.2)
Prothrombin Time: 15 s (ref 11.4–15.2)

## 2022-11-02 LAB — TYPE AND SCREEN
ABO/RH(D): B POS
Antibody Screen: NEGATIVE

## 2022-11-02 LAB — SAMPLE TO BLOOD BANK

## 2022-11-02 LAB — ETHANOL: Alcohol, Ethyl (B): <10 mg/dL (ref <10)

## 2022-11-02 LAB — POC OCCULT BLOOD, ED: Fecal Occult Bld: POSITIVE — AB

## 2022-11-02 MED ORDER — AMIODARONE HCL 200 MG PO TABS
200.0000 mg | ORAL_TABLET | Freq: Every day | ORAL | Status: DC
  Administered 2022-11-02 – 2022-11-03 (×2): 200 mg via ORAL
  Filled 2022-11-02 (×2): qty 1

## 2022-11-02 MED ORDER — METHOCARBAMOL 500 MG PO TABS: 750.0000 mg | ORAL_TABLET | Freq: Three times a day (TID) | ORAL | Status: DC | PRN

## 2022-11-02 MED ORDER — PNEUMOCOCCAL 20-VAL CONJ VACC 0.5 ML IM SUSY
0.5000 mL | PREFILLED_SYRINGE | INTRAMUSCULAR | Status: DC
  Filled 2022-11-02: qty 0.5

## 2022-11-02 MED ORDER — ATORVASTATIN CALCIUM 40 MG PO TABS
40.0000 mg | ORAL_TABLET | Freq: Every day | ORAL | Status: DC
  Administered 2022-11-02 – 2022-11-03 (×2): 40 mg via ORAL
  Filled 2022-11-02 (×2): qty 1

## 2022-11-02 MED ORDER — ACETAMINOPHEN 650 MG RE SUPP: 650.0000 mg | Freq: Four times a day (QID) | RECTAL | Status: DC | PRN

## 2022-11-02 MED ORDER — ACETAMINOPHEN 325 MG PO TABS: 650.0000 mg | ORAL_TABLET | Freq: Four times a day (QID) | ORAL | Status: DC | PRN

## 2022-11-02 MED ORDER — METOPROLOL SUCCINATE ER 50 MG PO TB24
50.0000 mg | ORAL_TABLET | Freq: Every day | ORAL | Status: DC
  Administered 2022-11-02 – 2022-11-03 (×2): 50 mg via ORAL
  Filled 2022-11-02: qty 2
  Filled 2022-11-02: qty 1

## 2022-11-02 MED ORDER — TETANUS-DIPHTH-ACELL PERTUSSIS 5-2.5-18.5 LF-MCG/0.5 IM SUSY
0.5000 mL | PREFILLED_SYRINGE | Freq: Once | INTRAMUSCULAR | Status: AC
  Administered 2022-11-02: 0.5 mL via INTRAMUSCULAR
  Filled 2022-11-02: qty 0.5

## 2022-11-02 NOTE — Progress Notes (Addendum)
   11/02/22 0244  Spiritual Encounters  Type of Visit Initial  Care provided to: Patient  Conversation partners present during encounter Nurse  Referral source Trauma page  Reason for visit Trauma  OnCall Visit Yes   Chaplain responded to Trauma 2 page, Fall on Thinners.  Pt alert.  Chaplain spoke briefly with Pt, no support person present, unclear if family was coming.  No further needs at this time.  Chaplain services remain available by Spiritual Consult or for emergent cases, paging [phone]  Chaplain Raelene Bott, MDiv [email or web address] [phone]

## 2022-11-02 NOTE — ED Notes (Signed)
ED TO INPATIENT HANDOFF REPORT  ED Nurse Name and Phone #: Jah Alarid [phone]  S Name/Age/Gender Jonathan Holden 77 y.o. male Room/Bed: 043C/043C  Code Status   Code Status: Full Code  Home/SNF/Other Home Patient oriented to: self, place, time, and situation Is this baseline? Yes   Triage Complete: Triage complete  Chief Complaint GI bleed [K92.2]  Triage Note Pt BIBEMS s/p fall from standing on thinners, as per pt he hit his back, denies hitting his head. Endorse multiple episodes of dark tarry stool   Allergies Allergies  Allergen Reactions   Eliquis [Apixaban] Other (See Comments)    Recurrent GI bleeding occurred from diverticulosis in the setting of anticoagulation therapy for atrial fibrillation.   Food Other (See Comments)    Pt is allergic to melons caused scratchy throat    Glutamic Acid Other (See Comments)    Pt is allergic to melons caused scratchy throat    Grass Pollen(K-O-R-T-Swt Vern)     Sneezing    Ciprofloxacin Hives   Pollen Extract Other (See Comments)    Sneezing     Level of Care/Admitting Diagnosis ED Disposition     ED Disposition  Admit   Condition  --   Comment  Hospital Area: [facility] [[id number]]  Level of Care: Telemetry Medical [104]  May place patient in observation at [facility] or [facility] if equivalent level of care is available:: Yes  Covid Evaluation: Asymptomatic - no recent exposure (last 10 days) testing not required  Diagnosis: GI bleed [098119]  Admitting Physician: John Giovanni [[id number]]  Attending Physician: John Giovanni [[id number]]          B Medical/Surgery History Past Medical History:  Diagnosis Date   Acute lower GI bleeding 03/19/2016   Arthritis    Atrial fibrillation with RVR (HCC) 02/21/2016   Coronary artery disease    Diverticulosis 04/18/2016   Dysrhythmia    AFIB   Endocarditis of native valve    Former smoker    History of MI (myocardial infarction)  1992   Hyperlipidemia    Hypertension    Infection of intervertebral disc (HCC)    Insomnia 04/18/2016   Left retinal detachment 03/18/2015   Loose body of right shoulder    Myocardial infarction [facility])    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   Pseudophakia of left eye 05/22/2013   Pseudophakia of right eye 06/05/2013   S/P coronary angioplasty    Skin cancer    Hands, arms, nose   Streptococcal bacteremia    Past Surgical History:  Procedure Laterality Date   CARDIOVASCULAR STRESS TEST  08/22/2008   CATARACT EXTRACTION W/ INTRAOCULAR LENS IMPLANT Bilateral    COLONOSCOPY WITH PROPOFOL N/A 02/10/2022   Procedure: COLONOSCOPY WITH PROPOFOL;  Surgeon: Charlott Rakes, MD;  Location: [facility];  Service: Gastroenterology;  Laterality: N/A;   CORONARY ANGIOPLASTY  1992   ERCP W/ SPHICTEROTOMY  12/15/2002   LAPAROSCOPIC CHOLECYSTECTOMY  12/16/2002   LEFT HEART CATH AND CORONARY ANGIOGRAPHY N/A 09/22/2022   Procedure: LEFT HEART CATH AND CORONARY ANGIOGRAPHY;  Surgeon: Tonny Bollman, MD;  Location: [facility] INVASIVE CV LAB;  Service: Cardiovascular;  Laterality: N/A;   LUMBAR LAMINECTOMY     L5   POLYPECTOMY  02/10/2022   Procedure: POLYPECTOMY;  Surgeon: Charlott Rakes, MD;  Location: [facility];  Service: Gastroenterology;;   REVERSE SHOULDER ARTHROPLASTY Right 01/09/2022   Procedure: REVERSE SHOULDER ARTHROPLASTY;  Surgeon: Yolonda Kida, MD;  Location: [facility] OR[facility];  Service: Orthopedics;  Laterality: Right;  150   RIGHT SHOULDER SURGERY      SKIN CANCER EXCISION     TEE WITHOUT CARDIOVERSION N/A 02/28/2016   Procedure: TRANSESOPHAGEAL ECHOCARDIOGRAM (TEE);  Surgeon: Pricilla Riffle, MD;  Location[facility] ENDOSCOPY;  Service: Cardiovascular;  Laterality: N/A;   TOTAL HIP ARTHROPLASTY Left 07/23/2021   Procedure: TOTAL HIP ARTHROPLASTY ANTERIOR APPROACH;  Surgeon: Ollen Gross, MD;  Location: [facility];  Service: Orthopedics;  Laterality: Left;     A IV  Location/Drains/Wounds Patient Lines/Drains/Airways Status     Active Line/Drains/Airways     Name Placement date Placement time Site Days   Peripheral IV 11/02/22 20 G Left;Posterior Hand 11/02/22  0106  Hand  less than 1            Intake/Output Last 24 hours No intake or output data in the 24 hours ending 11/02/22 1030  Labs/Imaging Results for orders placed or performed during the hospital encounter of 11/02/22 (from the past 48 hour(s))  Sample to Blood Bank     Status: None   Collection Time: 11/02/22  1:01 AM  Result Value Ref Range   Blood Bank Specimen SAMPLE AVAILABLE FOR TESTING    Sample Expiration      11/05/2022,2359 Performed at [facility], [address], [place], [place] [address]   Comprehensive metabolic panel     Status: Abnormal   Collection Time: 11/02/22  1:04 AM  Result Value Ref Range   Sodium 138 135 - 145 mmol/L   Potassium 4.1 3.5 - 5.1 mmol/L   Chloride 107 98 - 111 mmol/L   CO2 21 (L) 22 - 32 mmol/L   Glucose, Bld 107 (H) 70 - 99 mg/dL    Comment: Glucose reference range applies only to samples taken after fasting for at least 8 hours.   BUN 16 8 - 23 mg/dL   Creatinine, Ser 4.13 0.61 - 1.24 mg/dL   Calcium 9.1 8.9 - 24.4 mg/dL   Total Protein 6.3 (L) 6.5 - 8.1 g/dL   Albumin 3.4 (L) 3.5 - 5.0 g/dL   AST 19 15 - 41 U/L   ALT 19 0 - 44 U/L   Alkaline Phosphatase 76 38 - 126 U/L   Total Bilirubin 0.7 0.3 - 1.2 mg/dL   GFR, Estimated >01 >02 mL/min    Comment: (NOTE) Calculated using the CKD-EPI Creatinine Equation (2021)    Anion gap 10 5 - 15    Comment: Performed at [facility], [address] [place], [place] [address]  CBC     Status: Abnormal   Collection Time: 11/02/22  1:04 AM  Result Value Ref Range   WBC 11.4 (H) 4.0 - 10.5 K/uL   RBC 3.46 (L) 4.22 - 5.81 MIL/uL   Hemoglobin 11.3 (L) 13.0 - 17.0 g/dL   HCT 64.4 (L) 03.4 - 74.2 %   MCV 99.4 80.0 - 100.0 fL   MCH 32.7 26.0 - 34.0 pg   MCHC 32.8 30.0 - 36.0  g/dL   RDW 59.5 63.8 - 75.6 %   Platelets 185 150 - 400 K/uL   nRBC 0.0 0.0 - 0.2 %    Comment: Performed at [facility], [address], [place], [place] [address]  Ethanol     Status: None   Collection Time: 11/02/22  1:04 AM  Result Value Ref Range   Alcohol, Ethyl (B) <10 <10 mg/dL    Comment: (NOTE) Lowest detectable limit for serum alcohol is  10 mg/dL.  For medical purposes only. Performed at [facility], [address] [place], [place] [address]   Protime-INR     Status: None   Collection Time: 11/02/22  1:04 AM  Result Value Ref Range   Prothrombin Time 15.0 11.4 - 15.2 seconds   INR 1.2 0.8 - 1.2    Comment: (NOTE) INR goal varies based on device and disease states. Performed at [facility], [address], [place], [place] [address]   Troponin I (High Sensitivity)     Status: None   Collection Time: 11/02/22  1:04 AM  Result Value Ref Range   Troponin I (High Sensitivity) 5 <18 ng/L    Comment: (NOTE) Elevated high sensitivity troponin I (hsTnI) values and significant  changes across serial measurements may suggest ACS but many other  chronic and acute conditions are known to elevate hsTnI results.  Refer to the "Links" section for chest pain algorithms and additional  guidance. Performed at [facility], [address] [place], [place] [address]   POC occult blood, ED     Status: Abnormal   Collection Time: 11/02/22  1:11 AM  Result Value Ref Range   Fecal Occult Bld POSITIVE (A) NEGATIVE  Troponin I (High Sensitivity)     Status: None   Collection Time: 11/02/22  3:22 AM  Result Value Ref Range   Troponin I (High Sensitivity) 6 <18 ng/L    Comment: (NOTE) Elevated high sensitivity troponin I (hsTnI) values and significant  changes across serial measurements may suggest ACS but many other  chronic and acute conditions are known to elevate hsTnI results.  Refer to the "Links" section for chest pain algorithms and additional   guidance. Performed at [facility], [address], [place], [place] [address]   Urinalysis, Routine w reflex microscopic -Urine, Clean Catch     Status: None   Collection Time: 11/02/22  5:43 AM  Result Value Ref Range   Color, Urine YELLOW YELLOW   APPearance CLEAR CLEAR   Specific Gravity, Urine 1.013 1.005 - 1.030   pH 5.0 5.0 - 8.0   Glucose, UA NEGATIVE NEGATIVE mg/dL   Hgb urine dipstick NEGATIVE NEGATIVE   Bilirubin Urine NEGATIVE NEGATIVE   Ketones, ur NEGATIVE NEGATIVE mg/dL   Protein, ur NEGATIVE NEGATIVE mg/dL   Nitrite NEGATIVE NEGATIVE   Leukocytes,Ua NEGATIVE NEGATIVE    Comment: Performed at [facility], [address] [place], [place] [address]   CT HEAD WO CONTRAST  Result Date: 11/02/2022 CLINICAL DATA:  Head trauma, moderate-severe. Fall, on blood thinners EXAM: CT HEAD WITHOUT CONTRAST TECHNIQUE: Contiguous axial images were obtained from the base of the skull through the vertex without intravenous contrast. RADIATION DOSE REDUCTION: This exam was performed according to the departmental dose-optimization program which includes automated exposure control, adjustment of the mA and/or kV according to patient size and/or use of iterative reconstruction technique. COMPARISON:  06/09/2017 FINDINGS: Brain: Age related volume loss. No acute intracranial abnormality. Specifically, no hemorrhage, hydrocephalus, mass lesion, acute infarction, or significant intracranial injury. Vascular: No hyperdense vessel or unexpected calcification. Skull: No acute calvarial abnormality. Sinuses/Orbits: No acute findings Other: None IMPRESSION: No acute intracranial abnormality. Electronically Signed   By: Charlett Nose M.D.   On: 11/02/2022 01:31   DG Chest Port 1 View  Result Date: 11/02/2022 CLINICAL DATA:  Level 2 trauma, fall. EXAM: PORTABLE CHEST 1 VIEW COMPARISON:  09/19/2022. FINDINGS: The heart is enlarged and  the mediastinal contour is within normal limits. Mild  airspace disease is present at the left lung base. No consolidation, effusion, or pneumothorax. Right shoulder arthroplasty changes are noted. No acute osseous abnormality. IMPRESSION: Low lung volumes with atelectasis at the left lung base. Electronically Signed   By: Thornell Sartorius M.D.   On: 11/02/2022 01:28    Pending Labs Unresulted Labs (From admission, onward)     Start     Ordered   11/02/22 0610  CBC  Once,   R        11/02/22 0609   11/02/22 0610  Type and screen [facility]  Once,   R       Comments: [facility]    11/02/22 0609            Vitals/Pain Today's Vitals   11/02/22 0538 11/02/22 0625 11/02/22 0921 11/02/22 0922  BP:  130/75 (!) 140/73   Pulse:  76 74   Resp:  12 17   Temp: 98.3 F (36.8 C)   98.3 F (36.8 C)  TempSrc: Oral   Oral  SpO2:  (!) 80% 100%   Weight:      Height:      PainSc:        Isolation Precautions No active isolations  Medications Medications  amiodarone (PACERONE) tablet 200 mg (200 mg Oral Given 11/02/22 0919)  atorvastatin (LIPITOR) tablet 40 mg (40 mg Oral Given 11/02/22 0919)  metoprolol succinate (TOPROL-XL) 24 hr tablet 50 mg (50 mg Oral Given 11/02/22 0918)  acetaminophen (TYLENOL) tablet 650 mg (has no administration in time range)    Or  acetaminophen (TYLENOL) suppository 650 mg (has no administration in time range)  Tdap (BOOSTRIX) injection 0.5 mL (0.5 mLs Intramuscular Given 11/02/22 0323)    Mobility walks with person assist     Focused Assessments    R Recommendations: See Admitting Provider Note  Report given to:   Additional Notes:

## 2022-11-02 NOTE — Consult Note (Signed)
[facility] Gastroenterology Consult  Referring Provider: No ref. provider found Primary Care Physician:  Ardith Dark, MD Primary Gastroenterologist: [facility] - Dr. Bosie Clos  Reason for Consultation: rectal bleeding   SUBJECTIVE:   HPI: Jonathan Holden is a 77 y.o. male with past medical history significant for coronary artery disease on aspirin and clopidogrel (last dose 10/30/22), atrial fibrillation not on anticoagulation secondary to history of GI bleeding, history DVT, HTN, HLD. Presented to hospital on 11/02/22 with chief complaint of syncope/fall. He noted that he also has been experiencing blood per rectum since 10/30/22, large volume, for which he stopped taking his clopidogrel. He noted having some left sided abdominal discomfort which he attributes to gas discomfort. He denied nausea, vomiting. Denied chest pain or shortness of breath. Last bowel movement was on Saturday with blood. He has not had further bleeding since Saturday, he presented to hospital as he had episode of loss of consciousness.   CXR showed low lung volume and atelectasis at left lung base. CT head completed with no acute findings. Hgb on presentation 11.3, was 14.2 on 09/23/22. PLT 185, BUN/Cr 16/1.2.   Colonoscopy completed 02/10/22 for personal history colon polyp (Dr. Bosie Clos) showed stool in the rectum, rectosigmoid, sigmoid and descending colon, 2 mm cecum tubular adenoma, descending colon polyp x 2 showing colonic mucosa with prominent lymphoid aggregate on pathology, pan-colonic diverticulosis, internal hemorrhoids.   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   Pseudophakia of left eye 05/22/2013   Pseudophakia of right eye 06/05/2013   S/P coronary angioplasty    Skin cancer    Hands, arms, nose   Streptococcal bacteremia    Past Surgical History:  Procedure Laterality Date   CARDIOVASCULAR STRESS TEST  08/22/2008   CATARACT EXTRACTION W/ INTRAOCULAR LENS IMPLANT Bilateral    COLONOSCOPY WITH PROPOFOL N/A 02/10/2022   Procedure: COLONOSCOPY WITH PROPOFOL;  Surgeon: Charlott Rakes, MD;  Location: [facility] ENDOSCOPY;  Service: Gastroenterology;  Laterality: N/A;   CORONARY ANGIOPLASTY  1992   ERCP W/ SPHICTEROTOMY  12/15/2002   LAPAROSCOPIC CHOLECYSTECTOMY  12/16/2002   LEFT HEART CATH AND CORONARY ANGIOGRAPHY N/A 09/22/2022   Procedure: LEFT HEART CATH AND CORONARY ANGIOGRAPHY;  Surgeon: Tonny Bollman, MD;  Location: [facility] INVASIVE CV LAB;  Service: Cardiovascular;  Laterality: N/A;   LUMBAR LAMINECTOMY     L5   POLYPECTOMY  02/10/2022   Procedure: POLYPECTOMY;  Surgeon: Charlott Rakes, MD;  Location: [facility] ENDOSCOPY;  Service: Gastroenterology;;   REVERSE SHOULDER ARTHROPLASTY Right 01/09/2022   Procedure: REVERSE SHOULDER ARTHROPLASTY;  Surgeon: Yolonda Kida, MD;  Location: [facility] ORS;  Service: Orthopedics;  Laterality: Right;  150   RIGHT SHOULDER SURGERY      SKIN CANCER EXCISION     TEE WITHOUT CARDIOVERSION N/A 02/28/2016   Procedure: TRANSESOPHAGEAL ECHOCARDIOGRAM (TEE);  Surgeon: Pricilla Riffle, MD;  Location: [facility] ENDOSCOPY;  Service: Cardiovascular;  Laterality: N/A;  TOTAL HIP ARTHROPLASTY Left 07/23/2021   Procedure: TOTAL HIP ARTHROPLASTY ANTERIOR APPROACH;  Surgeon: Ollen Gross, MD;  Location: [facility] ORS;  Service: Orthopedics;  Laterality: Left;   Prior to Admission medications   Medication Sig Start Date End Date Taking? Authorizing Provider   amiodarone (PACERONE) 200 MG tablet TAKE 1 TABLET (200 MG TOTAL) BY MOUTH DAILY. \ 05/11/22  Yes Nahser, Deloris Ping, MD  atorvastatin (LIPITOR) 40 MG tablet Take 1 tablet (40 mg total) by mouth daily. 09/24/22  Yes Duke, Roe Rutherford, PA  Carboxymethylcellul-Glycerin (LUBRICATING EYE DROPS OP) Place 1 drop into both eyes daily as needed (dry eyes).   Yes [provider]  clobetasol ointment (TEMOVATE) 0.05 % APPLY TO AFFECTED AREA TWICE A DAY Patient taking differently: Apply 1 Application topically daily as needed (for eczema). 10/24/21  Yes Ardith Dark, MD  clopidogrel (PLAVIX) 75 MG tablet Take 1 tablet (75 mg total) by mouth daily. 09/24/22  Yes Duke, Roe Rutherford, PA  Evolocumab (REPATHA SURECLICK) 140 MG/ML SOAJ Inject 140 mg into the skin every 14 (fourteen) days. Patient taking differently: Inject 1 each into the skin every 14 (fourteen) days. Every other Wednesday 01/23/22  Yes Lyn Records, MD  isosorbide mononitrate (IMDUR) 30 MG 24 hr tablet TAKE 1 TABLET BY MOUTH EVERY DAY 10/23/22  Yes [name], NP  ketoconazole (NIZORAL) 2 % shampoo APPLY 1 TO 3 TIMES A WEEK TO SCALP. LET SIT 10 MIN THEN RINSE OUT. STRENGTH: 2 % Patient taking differently: Apply 1 Application topically 3 (three) times a week. APPLY 1 TO 3 TIMES A WEEK TO SCALP. LET SIT 10 MIN THEN RINSE OUT. STRENGTH: 2 % 04/10/22  Yes Ardith Dark, MD  lisinopril (ZESTRIL) 10 MG tablet TAKE 1 TABLET BY MOUTH DAILY. PLEASE KEEP UPCOMING APPOINTMENT WITH DR. Katrinka Blazing IN APRIL 2023 05/11/22  Yes Nahser, Deloris Ping, MD  metoprolol succinate (TOPROL-XL) 50 MG 24 hr tablet Take 1 tablet (50 mg total) by mouth daily. Take with or immediately following a meal. 09/24/22  Yes Duke, Roe Rutherford, PA  nitroGLYCERIN (NITROSTAT) 0.4 MG SL tablet Place 1 tablet (0.4 mg total) under the tongue every 5 (five) minutes x 3 doses as needed for chest pain. 09/23/22  Yes Duke, Roe Rutherford, PA  valACYclovir (VALTREX) 1000 MG tablet Take 2 tablets  by mouth daily. If having a flare up, pt will take an additional tablet.   Yes [provider]  aspirin EC 81 MG tablet Take 1 tablet (81 mg total) by mouth daily. Swallow whole. 09/24/22   Marcelino Duster, PA  [facility] injection  08/30/22   [provider]  Study - SOS-AMI - selatogrel 16 mg/0.5 mL or placebo SQ injection (PI-Christopher) Inject 16 mg into the skin once. For Investigational Use Only. Inject 0.5 mL subcutaneously in the abdomen or thigh if you experience symptoms of a heart attack. Call 911 immediately and seek emergency medical. Please contact [facility] for any questions or concerns regarding this medication. Patient not taking: Reported on 11/02/2022    [provider]   Current Facility-Administered Medications  Medication Dose Route Frequency Provider Last Rate Last Admin   acetaminophen (TYLENOL) tablet 650 mg  650 mg Oral Q6H PRN John Giovanni, MD       Or   acetaminophen (TYLENOL) suppository 650 mg  650 mg Rectal Q6H PRN John Giovanni, MD       amiodarone (PACERONE) tablet 200 mg  200 mg Oral Daily Rathore,  Ulyess Blossom, MD   200 mg at 11/02/22 0919   atorvastatin (LIPITOR) tablet 40 mg  40 mg Oral Daily John Giovanni, MD   40 mg at 11/02/22 0919   methocarbamol (ROBAXIN) tablet 750 mg  750 mg Oral Q8H PRN Almon Hercules, MD       metoprolol succinate (TOPROL-XL) 24 hr tablet 50 mg  50 mg Oral Daily John Giovanni, MD   50 mg at 11/02/22 0918   [START ON 11/03/2022] pneumococcal 20-valent conjugate vaccine (PREVNAR 20) injection 0.5 mL  0.5 mL Intramuscular Tomorrow-1000 Almon Hercules, MD       Allergies as of 11/02/2022 - Review Complete 11/02/2022  Allergen Reaction Noted   Eliquis [apixaban] Other (See Comments) 04/18/2016   Food Other (See Comments) 03/17/2016   Glutamic acid Other (See Comments) 03/17/2016   Grass pollen(k-o-r-t-swt vern)  09/27/2020   Ciprofloxacin Hives 02/17/2016   Pollen extract  Other (See Comments) 03/18/2015   Family History  Problem Relation Age of Onset   Other Mother        AGE 38 HEALTHY   Heart attack Father 31       2 MI   Heart disease Maternal Grandfather    Heart disease Paternal Grandfather    Emphysema Other    Other Brother    Social History   Socioeconomic History   Marital status: Divorced    Spouse name: Not on file   Number of children: Not on file   Years of education: Not on file   Highest education level: Not on file  Occupational History   Occupation: sales  Tobacco Use   Smoking status: Former    Current packs/day: 0.00    Average packs/day: 2.0 packs/day for 20.0 years (40.0 ttl pk-yrs)    Types: Cigarettes    Start date: 03/27/1970    Quit date: 03/27/1990    Years since quitting: 32.6   Smokeless tobacco: Never  Vaping Use   Vaping status: Never Used  Substance and Sexual Activity   Alcohol use: Yes    Comment: Social   Drug use: No   Sexual activity: Not Currently  Other Topics Concern   Not on file  Social History Narrative   Not on file   Social Determinants of Health   Financial Resource Strain: Low Risk  (10/14/2022)   Overall Financial Resource Strain (CARDIA)    Difficulty of Paying Living Expenses: Not hard at all  Food Insecurity: No Food Insecurity (11/02/2022)   Hunger Vital Sign    Worried About Running Out of Food in the Last Year: Never true    Ran Out of Food in the Last Year: Never true  Transportation Needs: No Transportation Needs (11/02/2022)   PRAPARE - Administrator, Civil Service (Medical): No    Lack of Transportation (Non-Medical): No  Physical Activity: Inactive (10/14/2022)   Exercise Vital Sign    Days of Exercise per Week: 0 days    Minutes of Exercise per Session: 0 min  Stress: No Stress Concern Present (10/14/2022)   [employer] of Occupational Health - Occupational Stress Questionnaire    Feeling of Stress : Not at all  Social Connections: Socially Isolated  (10/14/2022)   Social Connection and Isolation Panel [NHANES]    Frequency of Communication with Friends and Family: Twice a week    Frequency of Social Gatherings with Friends and Family: Once a week    Attends Religious Services: Never    Production manager of  Clubs or Organizations: No    Attends Banker Meetings: Never    Marital Status: Divorced  Catering manager Violence: Not At Risk (11/02/2022)   Humiliation, Afraid, Rape, and Kick questionnaire    Fear of Current or Ex-Partner: No    Emotionally Abused: No    Physically Abused: No    Sexually Abused: No   Review of Systems:  Review of Systems  Respiratory:  Negative for shortness of breath.   Cardiovascular:  Negative for chest pain.  Gastrointestinal:  Positive for abdominal pain and blood in stool. Negative for nausea and vomiting.    OBJECTIVE:   Temp:  [97.9 F (36.6 C)-98.7 F (37.1 C)] 98.7 F (37.1 C) (10/14 1133) Pulse Rate:  [74-152] 74 (10/14 1133) Resp:  [12-25] 17 (10/14 0921) BP: (110-140)/(65-79) 130/79 (10/14 1133) SpO2:  [80 %-100 %] 99 % (10/14 1133) Weight:  [97.5 kg] 97.5 kg (10/14 0105)   Physical Exam Constitutional:      General: He is not in acute distress.    Appearance: He is not ill-appearing, toxic-appearing or diaphoretic.  Cardiovascular:     Rate and Rhythm: Normal rate and regular rhythm.  Pulmonary:     Effort: No respiratory distress.     Breath sounds: Normal breath sounds.  Abdominal:     General: Bowel sounds are normal. There is no distension.     Palpations: Abdomen is soft.     Tenderness: There is no abdominal tenderness. There is no guarding.  Musculoskeletal:     Right lower leg: No edema.     Left lower leg: No edema.  Skin:    General: Skin is warm and dry.  Neurological:     Mental Status: He is alert.     Labs: Recent Labs    11/02/22 0104  WBC 11.4*  HGB 11.3*  HCT 34.4*  PLT 185   BMET Recent Labs    11/02/22 0104  NA 138  K 4.1  CL  107  CO2 21*  GLUCOSE 107*  BUN 16  CREATININE 1.20  CALCIUM 9.1   LFT Recent Labs    11/02/22 0104  PROT 6.3*  ALBUMIN 3.4*  AST 19  ALT 19  ALKPHOS 76  BILITOT 0.7   PT/INR Recent Labs    11/02/22 0104  LABPROT 15.0  INR 1.2    Diagnostic imaging: CT HEAD WO CONTRAST  Result Date: 11/02/2022 CLINICAL DATA:  Head trauma, moderate-severe. Fall, on blood thinners EXAM: CT HEAD WITHOUT CONTRAST TECHNIQUE: Contiguous axial images were obtained from the base of the skull through the vertex without intravenous contrast. RADIATION DOSE REDUCTION: This exam was performed according to the departmental dose-optimization program which includes automated exposure control, adjustment of the mA and/or kV according to patient size and/or use of iterative reconstruction technique. COMPARISON:  06/09/2017 FINDINGS: Brain: Age related volume loss. No acute intracranial abnormality. Specifically, no hemorrhage, hydrocephalus, mass lesion, acute infarction, or significant intracranial injury. Vascular: No hyperdense vessel or unexpected calcification. Skull: No acute calvarial abnormality. Sinuses/Orbits: No acute findings Other: None IMPRESSION: No acute intracranial abnormality. Electronically Signed   By: Charlett Nose M.D.   On: 11/02/2022 01:31   DG Chest Port 1 View  Result Date: 11/02/2022 CLINICAL DATA:  Level 2 trauma, fall. EXAM: PORTABLE CHEST 1 VIEW COMPARISON:  09/19/2022. FINDINGS: The heart is enlarged and the mediastinal contour is within normal limits. Mild airspace disease is present at the left lung base. No consolidation, effusion, or pneumothorax. Right shoulder  arthroplasty changes are noted. No acute osseous abnormality. IMPRESSION: Low lung volumes with atelectasis at the left lung base. Electronically Signed   By: Thornell Sartorius M.D.   On: 11/02/2022 01:28    IMPRESSION: Rectal bleeding, painless Pan-colonic diverticulosis Personal history colon polyp Coronary artery  disease on aspirin (every other day) and clopidogrel (last dose 10/30/22)    PLAN: -Suspect diverticular source of GI bleeding with symptomatic anemia -Start diet today, trend bowel movements and H/H, transfuse for Hgb < 7  -[facility] will follow   LOS: 0 days   Liliane Shi, [facility] Gastroenterology

## 2022-11-02 NOTE — ED Notes (Signed)
This RN attempted to return call to pt's significant other but no answer at this time.

## 2022-11-02 NOTE — Progress Notes (Signed)
Did not place SCDs. Pt stating he has blood clots in both legs. Will hold.

## 2022-11-02 NOTE — Progress Notes (Signed)
PROGRESS NOTE  Jonathan Holden ZOX:[id number] DOB: [birth year]   PCP: Ardith Dark, MD  Patient is from: Home.  Independently ambulates at baseline.  DOA: 11/02/2022 LOS: 0  Chief complaints Chief Complaint  Patient presents with   Fall     Brief Narrative / Interim history: 77 year old M with PMH of CAD s/p PCI to RCA, recent non-STEMI without good PCI target for which she is manage medically with DAPT, PAF and DVT not on AC due to GIB, HTN and HLD presented to ED with GI bleed, syncope with fall striking his head.  He was hemodynamically stable.  WBC 11.4 (baseline 14-15).  EtOH level undetectable.  Troponin negative x 2.  Brown blood-tinged stool noted on rectal exam by EDP.  Hemoccult positive.  CXR showed low lung volumes with atelectasis.  CT head negative for acute finding.  Given Tdap injection.  Hospitalist service called for admission.  GI consulted.  Subjective: Seen and examined earlier this morning.  No major events overnight of this morning.  Complained generalized back pain from lying in hospital bed.  Denies radiculopathic symptoms.  Asking for some muscle relaxer.  Reports some lightheadedness.  Denies chest pain or dyspnea.  No further bowel movement or GI bleed.  Objective: Vitals:   11/02/22 0625 11/02/22 0921 11/02/22 0922 11/02/22 1133  BP: 130/75 (!) 140/73  130/79  Pulse: 76 74  74  Resp: 12 17    Temp:   98.3 F (36.8 C) 98.7 F (37.1 C)  TempSrc:   Oral Oral  SpO2: (!) 80% 100%  99%  Weight:      Height:        Examination:  GENERAL: No apparent distress.  Nontoxic. HEENT: MMM.  Vision and hearing grossly intact.  NECK: Supple.  No apparent JVD.  RESP:  No IWOB.  Fair aeration bilaterally. CVS:  RRR. Heart sounds normal.  ABD/GI/GU: BS+. Abd soft, NTND.  MSK/EXT:  Moves extremities. No apparent deformity. No edema.  SKIN: no apparent skin lesion or wound NEURO: Awake, alert and oriented appropriately.  No apparent focal neuro  deficit. PSYCH: Calm. Normal affect.   Procedures:  None  Microbiology summarized: None  Assessment and plan: Principal Problem:   Acute lower GI bleeding Active Problems:   HLD (hyperlipidemia)   CAD (coronary artery disease)   Paroxysmal A-fib (HCC)   Acute blood loss anemia   Syncope  Acute blood loss anemia secondary to acute lower GI bleed: Suspect diverticular bleed.  He has colonoscopy in 01/2022 showed diverticulosis and hemorrhoid.  He has no abdominal pain but mild LLQ tenderness.  No fever or leukocytosis to suggest infection or diverticulitis.  He is on Plavix and aspirin for CAD.  Denies NSAID use. Recent Labs    12/25/21 0900 09/19/22 0819 09/19/22 1148 09/20/22 0500 09/21/22 0242 09/22/22 0438 09/23/22 0550 11/02/22 0104  HGB 14.3 14.2 15.0 14.2 15.1 14.2 14.2 11.3*  -Monitor H&H and transfuse for Hgb <8.0 given history of CAD. -Appreciate GI input-started regular diet -Follow GI recommendation -Continue holding Plavix and aspirin   Syncope colon likely related to GI bleed/acute blood loss.  He has no cardiopulmonary symptoms other than lightheadedness.  Denies chest pain or dyspnea.  Serial troponin and EKG unrevealing.  Recent echo reassuring. -Continue cardiac monitoring -Fall precautions -Manage GI bleed as above.   CAD/recent NSTEMI: Has prior RCA angioplasty.  Recent LHC in 09/2022 with no good PCI target.  Medical management was recommended and he was started on Plavix  in addition to home aspirin. -Hold aspirin and Plavix in the setting of GI bleed   Paroxysmal A-fib: Currently in sinus rhythm.  Not on AC due to GI bleed. -Continue amiodarone and metoprolol. -Optimize electrolytes  History of RLE DVT: Not on AC due to GI bleed. Following with vascular surgery to determine if he is a candidate for IVC filter.  He should have some degree of protection from aspirin and Plavix -Outpatient follow-up.   Hyperlipidemia -Continue Lipitor.   Body mass  index is 27.6 kg/m.          DVT prophylaxis:  SCD due to GI bleed  Code Status: Full code Family Communication: None at bedside Level of care: Telemetry Medical Status is: Observation The patient will require care spanning > 2 midnights and should be moved to inpatient because: Acute blood loss anemia due to GI bleed   Final disposition: Home Consultants:  GI  45 minutes with more than 50% spent in reviewing records, counseling patient/family and coordinating care.   Sch Meds:  Scheduled Meds:  amiodarone  200 mg Oral Daily   atorvastatin  40 mg Oral Daily   metoprolol succinate  50 mg Oral Daily   [START ON 11/03/2022] pneumococcal 20-valent conjugate vaccine  0.5 mL Intramuscular Tomorrow-1000   Continuous Infusions: PRN Meds:.acetaminophen **OR** acetaminophen, methocarbamol  Antimicrobials: Anti-infectives (From admission, onward)    None        I have personally reviewed the following labs and images: CBC: Recent Labs  Lab 11/02/22 0104  WBC 11.4*  HGB 11.3*  HCT 34.4*  MCV 99.4  PLT 185   BMP &GFR Recent Labs  Lab 11/02/22 0104  NA 138  K 4.1  CL 107  CO2 21*  GLUCOSE 107*  BUN 16  CREATININE 1.20  CALCIUM 9.1   Estimated Creatinine Clearance: 59 mL/min (by C-G formula based on SCr of 1.2 mg/dL). Liver & Pancreas: Recent Labs  Lab 11/02/22 0104  AST 19  ALT 19  ALKPHOS 76  BILITOT 0.7  PROT 6.3*  ALBUMIN 3.4*   No results for input(s): "LIPASE", "AMYLASE" in the last 168 hours. No results for input(s): "AMMONIA" in the last 168 hours. Diabetic: No results for input(s): "HGBA1C" in the last 72 hours. No results for input(s): "GLUCAP" in the last 168 hours. Cardiac Enzymes: No results for input(s): "CKTOTAL", "CKMB", "CKMBINDEX", "TROPONINI" in the last 168 hours. No results for input(s): "PROBNP" in the last 8760 hours. Coagulation Profile: Recent Labs  Lab 11/02/22 0104  INR 1.2   Thyroid Function Tests: No results  for input(s): "TSH", "T4TOTAL", "FREET4", "T3FREE", "THYROIDAB" in the last 72 hours. Lipid Profile: No results for input(s): "CHOL", "HDL", "LDLCALC", "TRIG", "CHOLHDL", "LDLDIRECT" in the last 72 hours. Anemia Panel: No results for input(s): "VITAMINB12", "FOLATE", "FERRITIN", "TIBC", "IRON", "RETICCTPCT" in the last 72 hours. Urine analysis:    Component Value Date/Time   COLORURINE YELLOW 11/02/2022 0543   APPEARANCEUR CLEAR 11/02/2022 0543   LABSPEC 1.013 11/02/2022 0543   PHURINE 5.0 11/02/2022 0543   GLUCOSEU NEGATIVE 11/02/2022 0543   HGBUR NEGATIVE 11/02/2022 0543   BILIRUBINUR NEGATIVE 11/02/2022 0543   KETONESUR NEGATIVE 11/02/2022 0543   PROTEINUR NEGATIVE 11/02/2022 0543   NITRITE NEGATIVE 11/02/2022 0543   LEUKOCYTESUR NEGATIVE 11/02/2022 0543   Sepsis Labs: Invalid input(s): "PROCALCITONIN", "LACTICIDVEN"  Microbiology: No results found for this or any previous visit (from the past 240 hour(s)).  Radiology Studies: CT HEAD WO CONTRAST  Result Date: 11/02/2022 CLINICAL DATA:  Head  trauma, moderate-severe. Fall, on blood thinners EXAM: CT HEAD WITHOUT CONTRAST TECHNIQUE: Contiguous axial images were obtained from the base of the skull through the vertex without intravenous contrast. RADIATION DOSE REDUCTION: This exam was performed according to the departmental dose-optimization program which includes automated exposure control, adjustment of the mA and/or kV according to patient size and/or use of iterative reconstruction technique. COMPARISON:  06/09/2017 FINDINGS: Brain: Age related volume loss. No acute intracranial abnormality. Specifically, no hemorrhage, hydrocephalus, mass lesion, acute infarction, or significant intracranial injury. Vascular: No hyperdense vessel or unexpected calcification. Skull: No acute calvarial abnormality. Sinuses/Orbits: No acute findings Other: None IMPRESSION: No acute intracranial abnormality. Electronically Signed   By: Charlett Nose  M.D.   On: 11/02/2022 01:31   DG Chest Port 1 View  Result Date: 11/02/2022 CLINICAL DATA:  Level 2 trauma, fall. EXAM: PORTABLE CHEST 1 VIEW COMPARISON:  09/19/2022. FINDINGS: The heart is enlarged and the mediastinal contour is within normal limits. Mild airspace disease is present at the left lung base. No consolidation, effusion, or pneumothorax. Right shoulder arthroplasty changes are noted. No acute osseous abnormality. IMPRESSION: Low lung volumes with atelectasis at the left lung base. Electronically Signed   By: Thornell Sartorius M.D.   On: 11/02/2022 01:28      Troyce Febo T. Javius Sylla [facility]  If 7PM-7AM, please contact night-coverage [email or web address] 11/02/2022, 12:03 PM

## 2022-11-02 NOTE — Plan of Care (Signed)

## 2022-11-02 NOTE — ED Notes (Signed)
Skin tears to bilateral arms cleaned and redressed

## 2022-11-02 NOTE — H&P (Signed)
History and Physical    Jonathan Holden:[id number] DOB: 1945-09-13 DOA: 11/02/2022  PCP: Ardith Dark, MD  Patient coming from: Home  Chief Complaint: Syncope  HPI: Jonathan Holden is a 77 y.o. male with medical history significant of CAD with history of RCA angioplasty admitted for NSTEMI a month ago managed medically due to no good PCI targets.  He is on DAPT (aspirin and Plavix).  Also history of paroxysmal A-fib not on anticoagulation due to history of GI bleed, chronic DVT, hyperlipidemia, hypertension presents to the ED for evaluation of GI bleed and syncope/fall striking his head.  Hemodynamically stable.  Labs showing WBC 11.4, hemoglobin 11.3 (was ranging between 14-15 on labs done last month), blood ethanol level undetectable, troponin negative x 2.  Brown blood-tinged stool noted on rectal exam done by EDP, no melena.  FOBT positive.  Chest x-ray showing low lung volumes with atelectasis at the left lung base.  CT head negative for acute intracranial abnormality. Patient was given Tdap injection.  TR[facility] called to admit.  Patient reports 2-day history of noticing a small amount of blood in his stool.  He is taking aspirin and Plavix at home.  He has not vomited any blood.  Denies abdominal pain.  He reports a brief episode of loss of consciousness tonight.  He went to use his bathroom to urinate and as he was walking back to his bedroom he felt lightheaded and then lost consciousness and fell.  Denies palpitations, shortness breath, or chest pain.  Review of Systems:  Review of Systems  All other systems reviewed and are negative.   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   Pseudophakia of left eye 05/22/2013   Pseudophakia of right eye 06/05/2013   S/P coronary angioplasty    Skin cancer    Hands, arms, nose   Streptococcal bacteremia     Past Surgical History:  Procedure Laterality Date   CARDIOVASCULAR STRESS TEST  08/22/2008   CATARACT EXTRACTION W/ INTRAOCULAR LENS IMPLANT Bilateral    COLONOSCOPY WITH PROPOFOL N/A 02/10/2022   Procedure: COLONOSCOPY WITH PROPOFOL;  Surgeon: Charlott Rakes, MD;  Location: [facility] ENDOSCOPY;  Service: Gastroenterology;  Laterality: N/A;   CORONARY ANGIOPLASTY  1992   ERCP W/ SPHICTEROTOMY  12/15/2002   LAPAROSCOPIC CHOLECYSTECTOMY  12/16/2002   LEFT HEART CATH AND CORONARY ANGIOGRAPHY N/A 09/22/2022   Procedure: LEFT HEART CATH AND CORONARY ANGIOGRAPHY;  Surgeon: Tonny Bollman, MD;  Location: [facility] INVASIVE CV LAB;  Service: Cardiovascular;  Laterality: N/A;   LUMBAR LAMINECTOMY     L5   POLYPECTOMY  02/10/2022   Procedure: POLYPECTOMY;  Surgeon: Charlott Rakes, MD;  Location: [facility] ENDOSCOPY;  Service: Gastroenterology;;   REVERSE SHOULDER ARTHROPLASTY Right 01/09/2022   Procedure: REVERSE SHOULDER ARTHROPLASTY;  Surgeon: Yolonda Kida, MD;  Location: [facility] ORS;  Service: Orthopedics;  Laterality: Right;  150   RIGHT SHOULDER  SURGERY      SKIN CANCER EXCISION     TEE WITHOUT CARDIOVERSION N/A 02/28/2016   Procedure: TRANSESOPHAGEAL ECHOCARDIOGRAM (TEE);  Surgeon: Pricilla Riffle, MD;  Location: [facility] ENDOSCOPY;  Service: Cardiovascular;  Laterality: N/A;   TOTAL HIP ARTHROPLASTY Left 07/23/2021   Procedure: TOTAL HIP ARTHROPLASTY ANTERIOR APPROACH;  Surgeon: Ollen Gross, MD;  Location: [facility] ORS;  Service: Orthopedics;  Laterality: Left;     reports that he quit smoking about 32 years ago. His  smoking use included cigarettes. He started smoking about 52 years ago. He has a 40 pack-year smoking history. He has never used smokeless tobacco. He reports current alcohol use. He reports that he does not use drugs.  Allergies  Allergen Reactions   Eliquis [Apixaban] Other (See Comments)    Recurrent GI bleeding occurred from diverticulosis in the setting of anticoagulation therapy for atrial fibrillation.   Food Other (See Comments)    Pt is allergic to melons caused scratchy throat    Glutamic Acid Other (See Comments)    Pt is allergic to melons caused scratchy throat    Grass Pollen(K-O-R-T-Swt Vern)     Sneezing    Ciprofloxacin Hives   Pollen Extract Other (See Comments)    Sneezing     Family History  Problem Relation Age of Onset   Other Mother        AGE 67 HEALTHY   Heart attack Father 1       2 MI   Heart disease Maternal Grandfather    Heart disease Paternal Grandfather    Emphysema Other    Other Brother     Prior to Admission medications   Medication Sig Start Date End Date Taking? Authorizing Provider  amiodarone (PACERONE) 200 MG tablet TAKE 1 TABLET (200 MG TOTAL) BY MOUTH DAILY. \ 05/11/22  Yes Nahser, Deloris Ping, MD  atorvastatin (LIPITOR) 40 MG tablet Take 1 tablet (40 mg total) by mouth daily. 09/24/22  Yes Duke, Roe Rutherford, PA  Carboxymethylcellul-Glycerin (LUBRICATING EYE DROPS OP) Place 1 drop into both eyes daily as needed (dry eyes).   Yes [provider]  clobetasol ointment (TEMOVATE) 0.05 % APPLY TO AFFECTED AREA TWICE A DAY Patient taking differently: Apply 1 Application topically daily as needed (for eczema). 10/24/21  Yes Ardith Dark, MD  clopidogrel (PLAVIX) 75 MG tablet Take 1 tablet (75 mg total) by mouth daily. 09/24/22  Yes Duke, Roe Rutherford, PA  Evolocumab (REPATHA SURECLICK) 140 MG/ML SOAJ Inject 140 mg into the skin every 14 (fourteen) days. Patient taking differently: Inject 1 each into the skin every 14 (fourteen) days.  Every other Wednesday 01/23/22  Yes Lyn Records, MD  isosorbide mononitrate (IMDUR) 30 MG 24 hr tablet TAKE 1 TABLET BY MOUTH EVERY DAY 10/23/22  Yes [name], NP  ketoconazole (NIZORAL) 2 % shampoo APPLY 1 TO 3 TIMES A WEEK TO SCALP. LET SIT 10 MIN THEN RINSE OUT. STRENGTH: 2 % Patient taking differently: Apply 1 Application topically 3 (three) times a week. APPLY 1 TO 3 TIMES A WEEK TO SCALP. LET SIT 10 MIN THEN RINSE OUT. STRENGTH: 2 % 04/10/22  Yes Ardith Dark, MD  lisinopril (ZESTRIL) 10 MG tablet TAKE 1 TABLET BY MOUTH DAILY. PLEASE KEEP UPCOMING APPOINTMENT WITH DR. Katrinka Blazing IN APRIL 2023 05/11/22  Yes Nahser, Deloris Ping, MD  metoprolol succinate (TOPROL-XL) 50 MG 24 hr tablet Take 1 tablet (50 mg total) by mouth daily. Take with or immediately following a  meal. 09/24/22  Yes Duke, Roe Rutherford, PA  nitroGLYCERIN (NITROSTAT) 0.4 MG SL tablet Place 1 tablet (0.4 mg total) under the tongue every 5 (five) minutes x 3 doses as needed for chest pain. 09/23/22  Yes Duke, Roe Rutherford, PA  valACYclovir (VALTREX) 1000 MG tablet Take 2 tablets by mouth daily. If having a flare up, pt will take an additional tablet.   Yes [provider]  aspirin EC 81 MG tablet Take 1 tablet (81 mg total) by mouth daily. Swallow whole. 09/24/22   Marcelino Duster, PA  [facility] injection  08/30/22   [provider]  Study - SOS-AMI - selatogrel 16 mg/0.5 mL or placebo SQ injection (PI-Christopher) Inject 16 mg into the skin once. For Investigational Use Only. Inject 0.5 mL subcutaneously in the abdomen or thigh if you experience symptoms of a heart attack. Call 911 immediately and seek emergency medical. Please contact [facility] for any questions or concerns regarding this medication. Patient not taking: Reported on 11/02/2022    [provider]    Physical Exam: Vitals:   11/02/22 0103 11/02/22 0105  BP: 110/70   Pulse: 77   Resp: 15   Temp: 97.9 F (36.6 C)    Weight:  97.5 kg  Height:  6\' 2"  (1.88 m)    Physical Exam Vitals reviewed.  Constitutional:      General: He is not in acute distress. HENT:     Head: Normocephalic and atraumatic.  Eyes:     Extraocular Movements: Extraocular movements intact.  Cardiovascular:     Rate and Rhythm: Normal rate and regular rhythm.     Pulses: Normal pulses.  Pulmonary:     Effort: Pulmonary effort is normal. No respiratory distress.     Breath sounds: Normal breath sounds. No wheezing or rales.  Abdominal:     General: Bowel sounds are normal. There is no distension.     Palpations: Abdomen is soft.     Tenderness: There is no abdominal tenderness. There is no guarding.  Musculoskeletal:     Cervical back: Normal range of motion.     Right lower leg: No edema.     Left lower leg: No edema.  Skin:    General: Skin is warm and dry.  Neurological:     General: No focal deficit present.     Mental Status: He is alert and oriented to person, place, and time.     Labs on Admission: I have personally reviewed following labs and imaging studies  CBC: Recent Labs  Lab 11/02/22 0104  WBC 11.4*  HGB 11.3*  HCT 34.4*  MCV 99.4  PLT 185   Basic Metabolic Panel: Recent Labs  Lab 11/02/22 0104  NA 138  K 4.1  CL 107  CO2 21*  GLUCOSE 107*  BUN 16  CREATININE 1.20  CALCIUM 9.1   GFR: Estimated Creatinine Clearance: 59 mL/min (by C-G formula based on SCr of 1.2 mg/dL). Liver Function Tests: Recent Labs  Lab 11/02/22 0104  AST 19  ALT 19  ALKPHOS 76  BILITOT 0.7  PROT 6.3*  ALBUMIN 3.4*   No results for input(s): "LIPASE", "AMYLASE" in the last 168 hours. No results for input(s): "AMMONIA" in the last 168 hours. Coagulation Profile: Recent Labs  Lab 11/02/22 0104  INR 1.2   Cardiac Enzymes: No results for input(s): "CKTOTAL", "CKMB", "CKMBINDEX", "TROPONINI" in the last 168 hours. BNP (last 3 results) No results for input(s): "PROBNP" in the last 8760  hours. HbA1C: No results for input(s): "HGBA1C" in the last 72 hours. CBG: No results for input(s): "GLUCAP" in the last 168 hours. Lipid Profile: No results for input(s): "CHOL", "HDL", "LDLCALC", "TRIG", "CHOLHDL", "LDLDIRECT" in the last 72 hours. Thyroid Function Tests: No results for input(s): "TSH", "T4TOTAL", "FREET4", "T3FREE", "THYROIDAB" in the last 72 hours. Anemia Panel: No results for input(s): "VITAMINB12", "FOLATE", "FERRITIN", "TIBC", "IRON", "RETICCTPCT" in the last 72 hours. Urine analysis:    Component Value Date/Time   COLORURINE AMBER (A) 03/10/2016 1856   APPEARANCEUR HAZY (A) 03/10/2016 1856   LABSPEC 1.023 03/10/2016 1856   PHURINE 5.0 03/10/2016 1856   GLUCOSEU NEGATIVE 03/10/2016 1856   HGBUR NEGATIVE 03/10/2016 1856   BILIRUBINUR NEGATIVE 03/10/2016 1856   KETONESUR NEGATIVE 03/10/2016 1856   PROTEINUR 30 (A) 03/10/2016 1856   NITRITE NEGATIVE 03/10/2016 1856   LEUKOCYTESUR NEGATIVE 03/10/2016 1856    Radiological Exams on Admission: CT HEAD WO CONTRAST  Result Date: 11/02/2022 CLINICAL DATA:  Head trauma, moderate-severe. Fall, on blood thinners EXAM: CT HEAD WITHOUT CONTRAST TECHNIQUE: Contiguous axial images were obtained from the base of the skull through the vertex without intravenous contrast. RADIATION DOSE REDUCTION: This exam was performed according to the departmental dose-optimization program which includes automated exposure control, adjustment of the mA and/or kV according to patient size and/or use of iterative reconstruction technique. COMPARISON:  06/09/2017 FINDINGS: Brain: Age related volume loss. No acute intracranial abnormality. Specifically, no hemorrhage, hydrocephalus, mass lesion, acute infarction, or significant intracranial injury. Vascular: No hyperdense vessel or unexpected calcification. Skull: No acute calvarial abnormality. Sinuses/Orbits: No acute findings Other: None IMPRESSION: No acute intracranial abnormality.  Electronically Signed   By: Charlett Nose M.D.   On: 11/02/2022 01:31   DG Chest Port 1 View  Result Date: 11/02/2022 CLINICAL DATA:  Level 2 trauma, fall. EXAM: PORTABLE CHEST 1 VIEW COMPARISON:  09/19/2022. FINDINGS: The heart is enlarged and the mediastinal contour is within normal limits. Mild airspace disease is present at the left lung base. No consolidation, effusion, or pneumothorax. Right shoulder arthroplasty changes are noted. No acute osseous abnormality. IMPRESSION: Low lung volumes with atelectasis at the left lung base. Electronically Signed   By: Thornell Sartorius M.D.   On: 11/02/2022 01:28    EKG: Independently reviewed.  Sinus rhythm, artifact, borderline T wave abnormalities in inferior and lateral leads.  Assessment and Plan  Acute blood loss anemia secondary to acute lower GI bleed Patient is on aspirin and Plavix. Brown blood-tinged stool noted on rectal exam done by EDP, no melena.  FOBT positive.  Hemoglobin currently 11.3 and was previously ranging between 14-15 on labs done last month.  Patient is hemodynamically stable.  Colonoscopy done in January 2024 was showing diverticulosis and internal hemorrhoids. -Hold aspirin and Plavix -Keep n.p.o. except sips with meds -I have sent a secure chat message to Dr. Doretha Imus on-call for [facility] requesting consultation in the morning. -Type and screen, monitor H&H. Transfuse PRBCs if hemoglobin <8 given CAD.  Syncope Likely related to GI bleed/acute blood loss.  3 g drop in hemoglobin from labs a month ago.  Troponin negative x 2.  CT head negative for acute intracranial abnormality and no focal neurodeficit.  Echo done last month showing EF 50 to 55%, grade 1 diastolic dysfunction, trivial mitral valve regurgitation, mild aortic valve regurgitation, and no evidence of aortic stenosis. -Continue cardiac monitoring -Fall precautions -Management of GI bleed and acute blood loss anemia as mentioned above  CAD History of RCA  angioplasty.  Admitted for NSTEMI a month ago managed medically due to no good PCI targets.  He was discharged on DAPT.  Hold aspirin and Plavix at this time in the setting of acute GI bleed.  Workup at present not suggestive of ACS.  Patient is not endorsing chest pain.  Paroxysmal A-fib Currently in sinus rhythm.  Not on anticoagulation due to history of GI bleed.  Continue amiodarone and metoprolol.  Hyperlipidemia Continue Lipitor.  Chronic right lower extremity DVT Not on anticoagulation due to history of GI bleed.  Following with vascular surgery to determine if he is a candidate for IVC filter.  Code Status: Full code (discussed with the patient) Level of care: Telemetry bed Admission status: It is my clinical opinion that referral for OBSERVATION is reasonable and necessary in this patient based on the above information provided. The aforementioned taken together are felt to place the patient at high risk for further clinical deterioration. However, it is anticipated that the patient may be medically stable for discharge from the hospital within 24 to 48 hours.  John Giovanni MD [facility]  If 7PM-7AM, please contact night-coverage [email or web address]  11/02/2022, 5:25 AM

## 2022-11-02 NOTE — ED Provider Notes (Signed)
[place] EMERGENCY DEPARTMENT AT [facility] Provider Note   CSN: [id number] Arrival date & time: 11/02/22  0050     History  Chief Complaint  Patient presents with   Jonathan Holden    KARAN RAMNAUTH is a 77 y.o. male.  The history is provided by the patient and the EMS personnel.  Patient with history of atrial fibrillation, CAD presents after syncopal episode.  Patient reports over the past day he has felt fatigued.  He reports he was in the bathroom when he felt lightheaded had a brief syncopal episode.  He believes he struck his head.  He has skin tears of both arms.  No other injuries are reported. Patient does report over the past 1-2 days he has had bloody stool.  He is on Plavix but stopped use due to the bleeding  No cp/sob No new meds recently   EMS reports patient had initial blood pressure of 80 Past Medical History:  Diagnosis Date   Acute lower GI bleeding 03/19/2016   Arthritis    Atrial fibrillation with RVR (HCC) 02/21/2016   Coronary artery disease    Diverticulosis 04/18/2016   Dysrhythmia    AFIB   Endocarditis of native valve    Former smoker    History of MI (myocardial infarction) 1992   Hyperlipidemia    Hypertension    Infection of intervertebral disc (HCC)    Insomnia 04/18/2016   Left retinal detachment 03/18/2015   Loose body of right shoulder    Myocardial infarction [facility])    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 Authorizing Provider  amiodarone (PACERONE) 200 MG tablet TAKE 1 TABLET (200 MG TOTAL) BY MOUTH DAILY. \ 05/11/22  Yes Nahser, Deloris Ping, MD  atorvastatin (LIPITOR) 40 MG tablet Take 1 tablet (40 mg total) by mouth daily. 09/24/22  Yes Duke,  Roe Rutherford, PA  Carboxymethylcellul-Glycerin (LUBRICATING EYE DROPS OP) Place 1 drop into both eyes daily as needed (dry eyes).   Yes [provider]  clobetasol ointment (TEMOVATE) 0.05 % APPLY TO AFFECTED AREA TWICE A DAY Patient taking differently: Apply 1 Application topically daily as needed (for eczema). 10/24/21  Yes Ardith Dark, MD  clopidogrel (PLAVIX) 75 MG tablet Take 1 tablet (75 mg total) by mouth daily. 09/24/22  Yes Duke, Roe Rutherford, PA  Evolocumab (REPATHA SURECLICK) 140 MG/ML SOAJ Inject 140 mg into the skin every 14 (fourteen) days. Patient taking differently: Inject 1 each into the skin every 14 (fourteen) days. Every other Wednesday 01/23/22  Yes Lyn Records, MD  isosorbide mononitrate (IMDUR) 30 MG 24 hr tablet TAKE 1 TABLET BY MOUTH EVERY DAY 10/23/22  Yes [name], NP  ketoconazole (NIZORAL) 2 % shampoo APPLY 1 TO 3 TIMES A WEEK TO SCALP. LET SIT 10 MIN THEN RINSE OUT. STRENGTH: 2 % Patient taking differently: Apply 1 Application topically 3 (three) times a week. APPLY 1 TO 3 TIMES A WEEK TO SCALP. LET SIT 10 MIN THEN RINSE OUT. STRENGTH: 2 % 04/10/22  Yes Ardith Dark, MD  lisinopril (ZESTRIL) 10 MG tablet TAKE 1 TABLET BY MOUTH DAILY. PLEASE KEEP UPCOMING APPOINTMENT WITH DR. Katrinka Blazing IN [place] 2023  05/11/22  Yes Nahser, Deloris Ping, MD  metoprolol succinate (TOPROL-XL) 50 MG 24 hr tablet Take 1 tablet (50 mg total) by mouth daily. Take with or immediately following a meal. 09/24/22  Yes Duke, Roe Rutherford, PA  nitroGLYCERIN (NITROSTAT) 0.4 MG SL tablet Place 1 tablet (0.4 mg total) under the tongue every 5 (five) minutes x 3 doses as needed for chest pain. 09/23/22  Yes Duke, Roe Rutherford, PA  valACYclovir (VALTREX) 1000 MG tablet Take 2 tablets by mouth daily. If having a flare up, pt will take an additional tablet.   Yes [provider]  aspirin EC 81 MG tablet Take 1 tablet (81 mg total) by mouth daily. Swallow whole. 09/24/22   Marcelino Duster,  PA  [facility] injection  08/30/22   [provider]  Study - SOS-AMI - selatogrel 16 mg/0.5 mL or placebo SQ injection (PI-Christopher) Inject 16 mg into the skin once. For Investigational Use Only. Inject 0.5 mL subcutaneously in the abdomen or thigh if you experience symptoms of a heart attack. Call 911 immediately and seek emergency medical. Please contact [facility] for any questions or concerns regarding this medication. Patient not taking: Reported on 11/02/2022    [provider]      Allergies    Eliquis [apixaban], Food, Glutamic acid, Grass pollen([id number] vern), Ciprofloxacin, and Pollen extract    Review of Systems   Review of Systems  Constitutional:  Positive for fatigue.  Respiratory:  Negative for shortness of breath.   Cardiovascular:  Negative for chest pain.  Gastrointestinal:  Positive for blood in stool.  Genitourinary:  Negative for hematuria.  Neurological:  Positive for syncope. Negative for headaches.    Physical Exam Updated Vital Signs BP 110/70   Pulse 77   Temp 97.9 F (36.6 C)   Resp 15   Ht 1.88 m (6\' 2" )   Wt 97.5 kg   BMI 27.60 kg/m  Physical Exam CONSTITUTIONAL: Elderly, no acute distress HEAD: Normocephalic/atraumatic EYES: EOMI ENMT: Mucous membranes moist, no visible facial trauma NECK: supple no meningeal signs SPINE/BACK:entire spine nontender No bruising/crepitance/stepoffs noted to spine CV: S1/S2 noted, no murmurs/rubs/gallops noted LUNGS: Lungs are clear to auscultation bilaterally, no apparent distress Chest-no bruising or tenderness ABDOMEN: soft, nontender, no rebound or guarding, bowel sounds noted throughout abdomen, no bruising Rectal-nurse chaperone present.  No melena, stool is brown with blood tinge NEURO: Pt is awake/alert/appropriate, moves all extremitiesx4.  No facial droop.  GCS 15 EXTREMITIES: pulses normal/equal, full ROM Skin tears to both upper extremities  all other  extremities/joints palpated/ranged and nontender Pelvis stable SKIN: warm, color normal PSYCH: no abnormalities of mood noted, alert and oriented to situation  ED Results / Procedures / Treatments   Labs (all labs ordered are listed, but only abnormal results are displayed) Labs Reviewed  COMPREHENSIVE METABOLIC PANEL - Abnormal; Notable for the following components:      Result Value   CO2 21 (*)    Glucose, Bld 107 (*)    Total Protein 6.3 (*)    Albumin 3.4 (*)    All other components within normal limits  CBC - Abnormal; Notable for the following components:   WBC 11.4 (*)    RBC 3.46 (*)    Hemoglobin 11.3 (*)    HCT 34.4 (*)    All other components within normal limits  POC OCCULT BLOOD, ED - Abnormal; Notable for the following components:   Fecal Occult Bld POSITIVE (*)    All other  components within normal limits  ETHANOL  PROTIME-INR  URINALYSIS, ROUTINE W REFLEX MICROSCOPIC  SAMPLE TO BLOOD BANK  TROPONIN I (HIGH SENSITIVITY)  TROPONIN I (HIGH SENSITIVITY)    EKG EKG Interpretation Date/Time:  Monday November 02 2022 01:10:28 EDT Ventricular Rate:  72 PR Interval:  169 QRS Duration:  113 QT Interval:  429 QTC Calculation: 470 R Axis:   21  Text Interpretation: Sinus rhythm Borderline intraventricular conduction delay Abnormal R-wave progression, early transition Borderline T abnormalities, inferior leads Interpretation limited secondary to artifact Confirmed by Zadie Rhine ([id number]) on 11/02/2022 1:17:21 AM  Radiology CT HEAD WO CONTRAST  Result Date: 11/02/2022 CLINICAL DATA:  Head trauma, moderate-severe. Fall, on blood thinners EXAM: CT HEAD WITHOUT CONTRAST TECHNIQUE: Contiguous axial images were obtained from the base of the skull through the vertex without intravenous contrast. RADIATION DOSE REDUCTION: This exam was performed according to the departmental dose-optimization program which includes automated exposure control, adjustment of the mA  and/or kV according to patient size and/or use of iterative reconstruction technique. COMPARISON:  06/09/2017 FINDINGS: Brain: Age related volume loss. No acute intracranial abnormality. Specifically, no hemorrhage, hydrocephalus, mass lesion, acute infarction, or significant intracranial injury. Vascular: No hyperdense vessel or unexpected calcification. Skull: No acute calvarial abnormality. Sinuses/Orbits: No acute findings Other: None IMPRESSION: No acute intracranial abnormality. Electronically Signed   By: Charlett Nose M.D.   On: 11/02/2022 01:31   DG Chest Port 1 View  Result Date: 11/02/2022 CLINICAL DATA:  Level 2 trauma, fall. EXAM: PORTABLE CHEST 1 VIEW COMPARISON:  09/19/2022. FINDINGS: The heart is enlarged and the mediastinal contour is within normal limits. Mild airspace disease is present at the left lung base. No consolidation, effusion, or pneumothorax. Right shoulder arthroplasty changes are noted. No acute osseous abnormality. IMPRESSION: Low lung volumes with atelectasis at the left lung base. Electronically Signed   By: Thornell Sartorius M.D.   On: 11/02/2022 01:28    Procedures Procedures    Medications Ordered in ED Medications  Tdap (BOOSTRIX) injection 0.5 mL (has no administration in time range)    ED Course/ Medical Decision Making/ A&P Clinical Course as of 11/02/22 0308  Mon Nov 02, 2022  6045 Patient presents after syncopal episode at home.  I am suspicious the patient had an acute GI bleed event that precipitated his fall.  He has had an acute drop in his hemoglobin since last admission.  He is currently hemodynamically appropriate.  Heart rate has been appropriate, blood pressure has been stable.  No signs of traumatic injury [DW]  0234 Hemoglobin(!): 11.3 Mild anemia [DW]  0236 Last colonoscopy was January 2024 by Dr. Bosie Clos.  Patient with diverticulosis, hemorrhoids and polyps [DW]  0308 Discussed with Dr. Loney Loh for admission [DW]    Clinical Course User  Index [DW] Zadie Rhine, MD           Glasgow Coma Scale Score: 15      NEXUS Criteria Score: 0                Medical Decision Making Amount and/or Complexity of Data Reviewed Labs: ordered. Decision-making details documented in ED Course. Radiology: ordered. ECG/medicine tests: ordered.  Risk Prescription drug management. Decision regarding hospitalization.   This patient presents to the ED for concern of syncope, this involves an extensive number of treatment options, and is a complaint that carries with it a high risk of complications and morbidity.  The differential diagnosis includes but is not limited to acute blood loss anemia,  orthostatic hypotension, cardiac arrhythmia, pulmonary embolism, complete heart block  Comorbidities that complicate the patient evaluation: Patient's presentation is complicated by their history of atrial fibrillation, CAD  Social Determinants of Health: Patient's  physical inactivity   increases the complexity of managing their presentation  Additional history obtained: Additional history obtained from EMS  Records reviewed previous admission documents recent non-STEMI  Lab Tests: I Ordered, and personally interpreted labs.  The pertinent results include: Anemia  Imaging Studies ordered: I ordered imaging studies including CT scan head and X-ray chest   I independently visualized and interpreted imaging which showed no acute traumatic injuries I agree with the radiologist interpretation  Cardiac Monitoring: The patient was maintained on a cardiac monitor.  I personally viewed and interpreted the cardiac monitor which showed an underlying rhythm of:  sinus rhythm   Critical Interventions:   admission for monitoring  Consultations Obtained: I requested consultation with the admitting physician [facility] , and discussed  findings as well as pertinent plan - they recommend: will admit  Reevaluation: After the interventions noted above, I  reevaluated the patient and found that they have :improved  Complexity of problems addressed: Patient's presentation is most consistent with  acute presentation with potential threat to life or bodily function  Disposition: After consideration of the diagnostic results and the patient's response to treatment,  I feel that the patent would benefit from admission   .           Final Clinical Impression(s) / ED Diagnoses Final diagnoses:  Syncope and collapse  Gastrointestinal hemorrhage, unspecified gastrointestinal hemorrhage type  Acute anemia    Rx / DC Orders ED Discharge Orders     None         Zadie Rhine, MD 11/02/22 [phone]

## 2022-11-02 NOTE — ED Triage Notes (Signed)
Pt BIBEMS s/p fall from standing on thinners, as per pt he hit his back, denies hitting his head. Endorse multiple episodes of dark tarry stool

## 2022-11-03 DIAGNOSIS — I959 Hypotension, unspecified: Secondary | ICD-10-CM | POA: Diagnosis not present

## 2022-11-03 DIAGNOSIS — R55 Syncope and collapse: Secondary | ICD-10-CM | POA: Diagnosis not present

## 2022-11-03 DIAGNOSIS — R42 Dizziness and giddiness: Secondary | ICD-10-CM | POA: Diagnosis not present

## 2022-11-03 DIAGNOSIS — K922 Gastrointestinal hemorrhage, unspecified: Secondary | ICD-10-CM | POA: Diagnosis not present

## 2022-11-03 LAB — COMPREHENSIVE METABOLIC PANEL
ALT: 17 U/L (ref 0–44)
AST: 15 U/L (ref 15–41)
Albumin: 3.2 g/dL — ABNORMAL LOW (ref 3.5–5.0)
Alkaline Phosphatase: 78 U/L (ref 38–126)
Anion gap: 10 (ref 5–15)
BUN: 13 mg/dL (ref 8–23)
CO2: 21 mmol/L — ABNORMAL LOW (ref 22–32)
Calcium: 9.1 mg/dL (ref 8.9–10.3)
Chloride: 105 mmol/L (ref 98–111)
Creatinine, Ser: 1.1 mg/dL (ref 0.61–1.24)
GFR, Estimated: >60 mL/min (ref >60)
Glucose, Bld: 105 mg/dL — ABNORMAL HIGH (ref 70–99)
Potassium: 3.8 mmol/L (ref 3.5–5.1)
Sodium: 136 mmol/L (ref 135–145)
Total Bilirubin: 0.8 mg/dL (ref 0.3–1.2)
Total Protein: 6.5 g/dL (ref 6.5–8.1)

## 2022-11-03 LAB — RETICULOCYTES
Immature Retic Fract: 21.6 % — ABNORMAL HIGH (ref 2.3–15.9)
RBC.: 3.36 MIL/uL — ABNORMAL LOW (ref 4.22–5.81)
Retic Count, Absolute: 59.8 10*3/uL (ref 19.0–186.0)
Retic Ct Pct: 1.8 % (ref 0.4–3.1)

## 2022-11-03 LAB — FERRITIN: Ferritin: 170 ng/mL (ref 24–336)

## 2022-11-03 LAB — IRON AND TIBC
Iron: 21 ug/dL — ABNORMAL LOW (ref 45–182)
Saturation Ratios: 8 % — ABNORMAL LOW (ref 17.9–39.5)
TIBC: 259 ug/dL (ref 250–450)
UIBC: 238 ug/dL

## 2022-11-03 LAB — FOLATE: Folate: 8.7 ng/mL (ref >5.9)

## 2022-11-03 LAB — CBC
HCT: 31.8 % — ABNORMAL LOW (ref 39.0–52.0)
Hemoglobin: 10.8 g/dL — ABNORMAL LOW (ref 13.0–17.0)
MCH: 32 pg (ref 26.0–34.0)
MCHC: 34 g/dL (ref 30.0–36.0)
MCV: 94.4 fL (ref 80.0–100.0)
Platelets: 176 10*3/uL (ref 150–400)
RBC: 3.37 MIL/uL — ABNORMAL LOW (ref 4.22–5.81)
RDW: 13.1 % (ref 11.5–15.5)
WBC: 8.4 10*3/uL (ref 4.0–10.5)
nRBC: 0 % (ref 0.0–0.2)

## 2022-11-03 LAB — VITAMIN B12: Vitamin B-12: 172 pg/mL — ABNORMAL LOW (ref 180–914)

## 2022-11-03 LAB — MAGNESIUM: Magnesium: 2.2 mg/dL (ref 1.7–2.4)

## 2022-11-03 LAB — PHOSPHORUS: Phosphorus: 3.8 mg/dL (ref 2.5–4.6)

## 2022-11-03 MED ORDER — CLOPIDOGREL BISULFATE 75 MG PO TABS: 75.0000 mg | ORAL_TABLET | Freq: Every day | ORAL | Status: DC

## 2022-11-03 MED ORDER — FERROUS SULFATE 325 (65 FE) MG PO TABS
325.0000 mg | ORAL_TABLET | Freq: Every day | ORAL | Status: DC
  Administered 2022-11-03: 325 mg via ORAL
  Filled 2022-11-03: qty 1

## 2022-11-03 MED ORDER — CYANOCOBALAMIN 500 MCG PO TABS
500.0000 ug | ORAL_TABLET | Freq: Every day | ORAL | 0 refills | Status: AC
Start: 2022-11-03 — End: 2023-02-01

## 2022-11-03 MED ORDER — ASPIRIN 81 MG PO TBEC: 81.0000 mg | DELAYED_RELEASE_TABLET | Freq: Every day | ORAL | 12 refills | Status: AC

## 2022-11-03 MED ORDER — FERROUS SULFATE 325 (65 FE) MG PO TABS: 325.0000 mg | ORAL_TABLET | Freq: Every day | ORAL | 0 refills | Status: DC

## 2022-11-03 NOTE — Discharge Summary (Signed)
Physician Discharge Summary  JSON GERSH UEA:[id number] DOB: 02/17/1945 DOA: 11/02/2022  PCP: Ardith Dark, MD  Admit date: 11/02/2022 Discharge date: 11/03/2022  Admitted From: Home Disposition: Home  Recommendations for Outpatient Follow-up:  Follow up with PCP in 1-2 weeks Follow-up with gastroenterology as needed Started on Ferrous sulfate 325 mg p.o. daily Please obtain hemoglobin in 1 week to reassess, 10.8 at time of discharge  Home Health: No Equipment/Devices: None  Discharge Condition: Stable CODE STATUS: Full code Diet recommendation: Heart healthy diet  History of present illness:  Jonathan Holden is a 77 year old male with past medical history significant for CAD s/p PCI to RCA, recent non-STEMI without good PCI target for which she is manage medically with DAPT, PAF and DVT not on AC due to GIB, HTN and HLD presented to ED with GI bleed, syncope with fall striking his head.  He was hemodynamically stable.  WBC 11.4 (baseline 14-15).  EtOH level undetectable.  Troponin negative x 2.  Brown blood-tinged stool noted on rectal exam by EDP.  Hemoccult positive.  CXR showed low lung volumes with atelectasis.  CT head negative for acute finding.  Given Tdap injection.  Hospitalist service called for admission.  GI consulted.   Hospital course:  Acute blood loss anemia secondary to acute lower GI bleed; suspect diverticular Patient presenting to ED with presyncopal episode and noted blood in his stool.  Recent colonoscopy January 2024 with diverticulosis and hemorrhoids.  Patient was afebrile without leukocytosis and denied abdominal pain on admission.  Complicated by use of DAPT with aspirin and Plavix for underlying CAD.  Denies NSAID abuse.  Hemoglobin 11.3 on admission, with baseline 14-15.  GI was consulted and followed during hospital course.  Patient was monitored with serial H&H's which remained stable with resolution of blood in the stool.  Hemoglobin remained  stable 10.8 at time of discharge.  Discussed with GI, okay to resume DAPT on discharge.  Patient was also noted to have low iron levels, started on ferrous sulfate.  Recommend outpatient follow-up with PCP 1 week with repeat H&H.  Outpatient follow-up with GI as needed.  Iron deficiency/B12 deficiency anemia Anemia panel with iron 21, TIBC 259, ferritin 170, folate 8.7, vitamin B12 172.  Will start on ferrous sulfate and vitamin B12 supplementation.  Outpatient follow-up with PCP.  Pre-Syncope  Etiology likely related to GI bleed/acute blood loss.  He has no cardiopulmonary symptoms other than lightheadedness on admission which is now resolved..  Denies chest pain or dyspnea.  Serial troponin and EKG unrevealing.  Recent echo reassuring.  No arrhythmias noted on telemetry while inpatient.  Recommend continued monitoring of blood pressure on discharge daily.   CAD/recent NSTEMI:  History of prior RCA angioplasty.  Recent LHC in 09/2022 with no good PCI target.  Medical management was recommended and he was started on Plavix in addition to home aspirin.  Resume aspirin/Plavix on discharge.   Paroxysmal A-fib:  Currently in sinus rhythm.  Not on AC due to GI bleed. Continue amiodarone and metoprolol.   History of RLE DVT:  Not on AC due to GI bleed. Following with vascular surgery to determine if he is a candidate for IVC filter.  He should have some degree of protection from aspirin and Plavix.  Continue outpatient follow-up with vascular surgery.   Hyperlipidemia Continue atorvastatin  Discharge Diagnoses:  Principal Problem:   Acute lower GI bleeding Active Problems:   HLD (hyperlipidemia)   CAD (coronary artery disease)   Paroxysmal  A-fib (HCC)   Acute blood loss anemia   Syncope    Discharge Instructions  Discharge Instructions     Call MD for:  difficulty breathing, headache or visual disturbances   Complete by: As directed    Call MD for:  extreme fatigue   Complete by: As  directed    Call MD for:  persistant dizziness or light-headedness   Complete by: As directed    Call MD for:  persistant nausea and vomiting   Complete by: As directed    Call MD for:  severe uncontrolled pain   Complete by: As directed    Call MD for:  temperature >100.4   Complete by: As directed    Diet - low sodium heart healthy   Complete by: As directed    Increase activity slowly   Complete by: As directed       Allergies as of 11/03/2022       Reactions   Eliquis [apixaban] Other (See Comments)   Recurrent GI bleeding occurred from diverticulosis in the setting of anticoagulation therapy for atrial fibrillation.   Food Other (See Comments)   Pt is allergic to melons caused scratchy throat   Glutamic Acid Other (See Comments)   Pt is allergic to melons caused scratchy throat   Grass Pollen(k-[id number]-t-swt Vern)    Sneezing    Ciprofloxacin Hives   Pollen Extract Other (See Comments)   Sneezing         Medication List     STOP taking these medications    Shingrix injection Generic drug: Zoster Vaccine Adjuvanted   SOS-AMI selatogrel or placebo 16 mg/0.5 mL SQ injection       TAKE these medications    amiodarone 200 MG tablet Commonly known as: PACERONE TAKE 1 TABLET (200 MG TOTAL) BY MOUTH DAILY. \   aspirin EC 81 MG tablet Take 1 tablet (81 mg total) by mouth daily. Swallow whole. Start taking on: November 04, 2022   atorvastatin 40 MG tablet Commonly known as: LIPITOR Take 1 tablet (40 mg total) by mouth daily.   clobetasol ointment 0.05 % Commonly known as: TEMOVATE APPLY TO AFFECTED AREA TWICE A DAY What changed: See the new instructions.   clopidogrel 75 MG tablet Commonly known as: PLAVIX Take 1 tablet (75 mg total) by mouth daily. Start taking on: November 04, 2022   cyanocobalamin 500 MCG tablet Commonly known as: VITAMIN B12 Take 1 tablet (500 mcg total) by mouth daily.   ferrous sulfate 325 (65 FE) MG tablet Take 1 tablet (325 mg  total) by mouth daily with breakfast. Start taking on: November 04, 2022   isosorbide mononitrate 30 MG 24 hr tablet Commonly known as: IMDUR TAKE 1 TABLET BY MOUTH EVERY DAY   ketoconazole 2 % shampoo Commonly known as: NIZORAL APPLY 1 TO 3 TIMES A WEEK TO SCALP. LET SIT 10 MIN THEN RINSE OUT. STRENGTH: 2 % What changed:  how much to take how to take this when to take this   lisinopril 10 MG tablet Commonly known as: ZESTRIL TAKE 1 TABLET BY MOUTH DAILY. PLEASE KEEP UPCOMING APPOINTMENT WITH DR. Katrinka Blazing IN APRIL 2023   LUBRICATING EYE DROPS OP Place 1 drop into both eyes daily as needed (dry eyes).   metoprolol succinate 50 MG 24 hr tablet Commonly known as: TOPROL-XL Take 1 tablet (50 mg total) by mouth daily. Take with or immediately following a meal.   nitroGLYCERIN 0.4 MG SL tablet Commonly known as: NITROSTAT Place  1 tablet (0.4 mg total) under the tongue every 5 (five) minutes x 3 doses as needed for chest pain.   Repatha SureClick 140 MG/ML Soaj Generic drug: Evolocumab Inject 140 mg into the skin every 14 (fourteen) days. What changed:  how much to take additional instructions   valACYclovir 1000 MG tablet Commonly known as: VALTREX Take 2 tablets by mouth daily. If having a flare up, pt will take an additional tablet.        Follow-up Information     Ardith Dark, MD. Schedule an appointment as soon as possible for a visit in 1 week(s).   Specialty: Family Medicine Why: repeat Hemoglobin level at visit Contact information: [address] [place] [address] [phone]                Allergies  Allergen Reactions   Eliquis [Apixaban] Other (See Comments)    Recurrent GI bleeding occurred from diverticulosis in the setting of anticoagulation therapy for atrial fibrillation.   Food Other (See Comments)    Pt is allergic to melons caused scratchy throat    Glutamic Acid Other (See Comments)    Pt is allergic to melons caused scratchy  throat    Grass Pollen(K-O-R-T-Swt Vern)     Sneezing    Ciprofloxacin Hives   Pollen Extract Other (See Comments)    Sneezing     Consultations: [facility], Dr. Lorenso Quarry   Procedures/Studies: CT HEAD WO CONTRAST  Result Date: 11/02/2022 CLINICAL DATA:  Head trauma, moderate-severe. Fall, on blood thinners EXAM: CT HEAD WITHOUT CONTRAST TECHNIQUE: Contiguous axial images were obtained from the base of the skull through the vertex without intravenous contrast. RADIATION DOSE REDUCTION: This exam was performed according to the departmental dose-optimization program which includes automated exposure control, adjustment of the mA and/or kV according to patient size and/or use of iterative reconstruction technique. COMPARISON:  06/09/2017 FINDINGS: Brain: Age related volume loss. No acute intracranial abnormality. Specifically, no hemorrhage, hydrocephalus, mass lesion, acute infarction, or significant intracranial injury. Vascular: No hyperdense vessel or unexpected calcification. Skull: No acute calvarial abnormality. Sinuses/Orbits: No acute findings Other: None IMPRESSION: No acute intracranial abnormality. Electronically Signed   By: Charlett Nose M.D.   On: 11/02/2022 01:31   DG Chest Port 1 View  Result Date: 11/02/2022 CLINICAL DATA:  Level 2 trauma, fall. EXAM: PORTABLE CHEST 1 VIEW COMPARISON:  09/19/2022. FINDINGS: The heart is enlarged and the mediastinal contour is within normal limits. Mild airspace disease is present at the left lung base. No consolidation, effusion, or pneumothorax. Right shoulder arthroplasty changes are noted. No acute osseous abnormality. IMPRESSION: Low lung volumes with atelectasis at the left lung base. Electronically Signed   By: Thornell Sartorius M.D.   On: 11/02/2022 01:28     Subjective: Patient seen examined bedside, sitting in bedside chair.  No specific complaints this morning.  Reports normal brown stool this morning with no blood.   Hemoglobin stable, up to 10.8 this morning.  Denies lightheadedness.  Feels he is ready for discharge home.  Discussed with GI, Dr. Lorenso Quarry this morning, no other concerns or recommendations.  Did started on oral ferrous sulfate and B12 supplementation given the low iron of 21, and B12 level of 172.  No other questions, concerns or complaints at this time.  Denies headache, no dizziness, no chest pain, no palpitations, no visual changes, no shortness of breath, no abdominal pain, no fever/chills/night sweats, no nausea/vomiting/diarrhea, no cough/congestion, no focal weakness, no fatigue, no paresthesia.  No acute events overnight per nursing.  Discharging home today.  Discharge Exam: Vitals:   11/03/22 0552 11/03/22 0745  BP: 119/67 127/71  Pulse: 71 75  Resp: 14 18  Temp: 98.1 F (36.7 C) 97.8 F (36.6 C)  SpO2: 93% 100%   Vitals:   11/02/22 2300 11/03/22 0429 11/03/22 0552 11/03/22 0745  BP:  106/63 119/67 127/71  Pulse:  73 71 75  Resp:   14 18  Temp:  97.9 F (36.6 C) 98.1 F (36.7 C) 97.8 F (36.6 C)  TempSrc:      SpO2: 96% 93% 93% 100%  Weight:      Height:        Physical Exam: GEN: NAD, alert and oriented x 3, wd/wn HEENT: NCAT, PERRL, EOMI, sclera clear, MMM PULM: CTAB w/o wheezes/crackles, normal respiratory effort, on room air CV: RRR w/o M/G/R GI: abd soft, NTND, NABS, no R/G/M MSK: no peripheral edema, muscle strength globally intact 5/5 bilateral upper/lower extremities NEURO: CN II-XII intact, no focal deficits, sensation to light touch intact PSYCH: normal mood/affect Integumentary: Chronic venous changes noted bilateral lower extremities anterior shin, otherwise no other concerning rashes/lesions/wounds noted on exposed skin surfaces     The results of significant diagnostics from this hospitalization (including imaging, microbiology, ancillary and laboratory) are listed below for reference.     Microbiology: No results found for this or any previous  visit (from the past 240 hour(s)).   Labs: BNP (last 3 results) Recent Labs    09/19/22 0819  BNP 130.8*   Basic Metabolic Panel: Recent Labs  Lab 11/02/22 0104 11/03/22 0617  NA 138 136  K 4.1 3.8  CL 107 105  CO2 21* 21*  GLUCOSE 107* 105*  BUN 16 13  CREATININE 1.20 1.10  CALCIUM 9.1 9.1  MG  --  2.2  PHOS  --  3.8   Liver Function Tests: Recent Labs  Lab 11/02/22 0104 11/03/22 0617  AST 19 15  ALT 19 17  ALKPHOS 76 78  BILITOT 0.7 0.8  PROT 6.3* 6.5  ALBUMIN 3.4* 3.2*   No results for input(s): "LIPASE", "AMYLASE" in the last 168 hours. No results for input(s): "AMMONIA" in the last 168 hours. CBC: Recent Labs  Lab 11/02/22 0104 11/02/22 1758 11/03/22 0617  WBC 11.4* 9.4 8.4  HGB 11.3* 10.5* 10.8*  HCT 34.4* 31.9* 31.8*  MCV 99.4 94.9 94.4  PLT 185 178 176   Cardiac Enzymes: No results for input(s): "CKTOTAL", "CKMB", "CKMBINDEX", "TROPONINI" in the last 168 hours. BNP: Invalid input(s): "POCBNP" CBG: No results for input(s): "GLUCAP" in the last 168 hours. D-Dimer No results for input(s): "DDIMER" in the last 72 hours. Hgb A1c No results for input(s): "HGBA1C" in the last 72 hours. Lipid Profile No results for input(s): "CHOL", "HDL", "LDLCALC", "TRIG", "CHOLHDL", "LDLDIRECT" in the last 72 hours. Thyroid function studies No results for input(s): "TSH", "T4TOTAL", "T3FREE", "THYROIDAB" in the last 72 hours.  Invalid input(s): "FREET3" Anemia work up Recent Labs    11/03/22 0617  VITAMINB12 172*  FOLATE 8.7  FERRITIN 170  TIBC 259  IRON 21*  RETICCTPCT 1.8   Urinalysis    Component Value Date/Time   COLORURINE YELLOW 11/02/2022 0543   APPEARANCEUR CLEAR 11/02/2022 0543   LABSPEC 1.013 11/02/2022 0543   PHURINE 5.0 11/02/2022 0543   GLUCOSEU NEGATIVE 11/02/2022 0543   HGBUR NEGATIVE 11/02/2022 0543   BILIRUBINUR NEGATIVE 11/02/2022 0543   KETONESUR NEGATIVE 11/02/2022 0543   PROTEINUR NEGATIVE 11/02/2022 0543  NITRITE  NEGATIVE 11/02/2022 0543   LEUKOCYTESUR NEGATIVE 11/02/2022 0543   Sepsis Labs Recent Labs  Lab 11/02/22 0104 11/02/22 1758 11/03/22 0617  WBC 11.4* 9.4 8.4   Microbiology No results found for this or any previous visit (from the past 240 hour(s)).   Time coordinating discharge: Over 30 minutes  SIGNED:   Alvira Philips [place], DO  [facility] 11/03/2022, 11:45 AM

## 2022-11-03 NOTE — Evaluation (Signed)
Occupational Therapy Evaluation Patient Details Name: Jonathan Holden MRN: [id number] DOB: 1945/07/16 Today's Date: 11/03/2022   History of Present Illness Pt is a 77 y/o male presenting on 10/14 with GI bleed and syncope/fall striking his head. PMH includes: arthritis, CAD, MI, HTN, R shoulder surgery, L THA.   Clinical Impression   Patient admitted for above and presents near baseline at modified independent level for ADLs, transfers and functional mobility using RW. Cognition appears WFL, pt denies headache or cognitive changes.  Educated on safety with Adls seated, discussed DME with use of RW at dc.  He has support from significant other as needed.   No further OT needs have been identified, pt has no further questions or concerns, and OT will sign off. Thank you for this referral!        If plan is discharge home, recommend the following:      Functional Status Assessment     Equipment Recommendations  None recommended by OT    Recommendations for Other Services       Precautions / Restrictions Precautions Precautions: Fall Restrictions Weight Bearing Restrictions: No      Mobility Bed Mobility               General bed mobility comments: OOB upon entry    Transfers Overall transfer level: Modified independent                        Balance Overall balance assessment: Mild deficits observed, not formally tested                                         ADL either performed or assessed with clinical judgement   ADL Overall ADL's : Modified independent                                       General ADL Comments: pt in bathroom getting dressed upon entry, completing without assist.  Educated on safety and fall prevention     Vision   Vision Assessment?: No apparent visual deficits     Perception         Praxis         Pertinent Vitals/Pain Pain Assessment Pain Assessment: No/denies pain      Extremity/Trunk Assessment Upper Extremity Assessment Upper Extremity Assessment: Overall WFL for tasks assessed   Lower Extremity Assessment Lower Extremity Assessment: Overall WFL for tasks assessed       Communication Communication Communication: No apparent difficulties   Cognition Arousal: Alert Behavior During Therapy: WFL for tasks assessed/performed Overall Cognitive Status: Within Functional Limits for tasks assessed                                       General Comments  pt reports plan to use RW at dc.    Exercises     Shoulder Instructions      Home Living Family/patient expects to be discharged to:: Private residence Living Arrangements: Spouse/significant other Available Help at Discharge: Family;Available 24 hours/day Type of Home: House Home Access: Stairs to enter [employer] of Steps: 1 Entrance Stairs-Rails: None Home Layout: One level     Bathroom Shower/Tub: Walk-in shower  Bathroom Toilet: Handicapped height     Home Equipment: Agricultural consultant (2 wheels);Shower seat - built in;Grab bars - tub/shower          Prior Functioning/Environment Prior Level of Function : Independent/Modified Independent                        OT Problem List: Decreased activity tolerance;Decreased knowledge of use of DME or AE;Decreased knowledge of precautions      OT Treatment/Interventions:      OT Goals(Current goals can be found in the care plan section) Acute Rehab OT Goals Patient Stated Goal: home OT Goal Formulation: With patient  OT Frequency:      Co-evaluation              AM-PAC OT "6 Clicks" Daily Activity     Outcome Measure Help from another person eating meals?: None Help from another person taking care of personal grooming?: None Help from another person toileting, which includes using toliet, bedpan, or urinal?: None Help from another person bathing (including washing, rinsing,  drying)?: None Help from another person to put on and taking off regular upper body clothing?: None Help from another person to put on and taking off regular lower body clothing?: None 6 Click Score: 24   End of Session Equipment Utilized During Treatment: Rolling walker (2 wheels) Nurse Communication: Mobility status  Activity Tolerance: Patient tolerated treatment well Patient left: in chair  OT Visit Diagnosis: Unsteadiness on feet (R26.81)                Time: 8119-1478 OT Time Calculation (min): 17 min Charges:  OT General Charges $OT Visit: 1 Visit OT Evaluation $OT Eval Low Complexity: 1 Low  Barry Brunner, OT Acute Rehabilitation Services Office [phone]   Chancy Milroy 11/03/2022, 12:51 PM

## 2022-11-03 NOTE — Care Management Obs Status (Signed)
MEDICARE OBSERVATION STATUS NOTIFICATION   Patient Details  Name: Jonathan Holden MRN: [id number] Date of Birth: 14-Sep-1945   Medicare Observation Status Notification Given:  Yes    Lawerance Sabal, RN 11/03/2022, 8:49 AM

## 2022-11-03 NOTE — Progress Notes (Signed)
Discharge instructions given. Patient verbalized understanding and all questions were answered.  ?

## 2022-11-03 NOTE — Progress Notes (Signed)
CSW asked to speak with pt regarding transportation home.  Pt reports came to hospital by EMS, does not have anyone to pick him up at discharge.  Pt reports this is second admission and last time taxi voucher was provided.  He is requesting taxi ride home.  Unclear if DC today.  Daleen Squibb, MSW, LCSW 10/15/202410:39 AM

## 2022-11-03 NOTE — Progress Notes (Signed)
Cab voucher given to pt, Pt wheeled down to the main entrance for the awaiting blue bird tax, Pt remains alert/oriented in no apparent distress, No complaints. Pt d/c to home as ordered.

## 2022-11-03 NOTE — Plan of Care (Signed)
  Problem: Activity: Goal: Ability to return to baseline activity level will improve Outcome: Progressing   Problem: Clinical Measurements: Goal: Will remain free from infection Outcome: Progressing Goal: Diagnostic test results will improve Outcome: Progressing Goal: Respiratory complications will improve Outcome: Progressing Goal: Cardiovascular complication will be avoided Outcome: Progressing   Problem: Activity: Goal: Risk for activity intolerance will decrease Outcome: Progressing   Problem: Nutrition: Goal: Adequate nutrition will be maintained Outcome: Progressing   Problem: Coping: Goal: Level of anxiety will decrease Outcome: Progressing   Problem: Elimination: Goal: Will not experience complications related to bowel motility Outcome: Progressing Goal: Will not experience complications related to urinary retention Outcome: Progressing   Problem: Pain Managment: Goal: General experience of comfort will improve Outcome: Progressing   Problem: Skin Integrity: Goal: Risk for impaired skin integrity will decrease Outcome: Progressing

## 2022-11-03 NOTE — Plan of Care (Signed)
Problem: Education: Goal: Knowledge of General Education information will improve Description: Including pain rating scale, medication(s)/side effects and non-pharmacologic comfort measures Outcome: Progressing   Problem: Clinical Measurements: Goal: Will remain free from infection Outcome: Progressing   Problem: Activity: Goal: Risk for activity intolerance will decrease Outcome: Progressing   Problem: Nutrition: Goal: Adequate nutrition will be maintained Outcome: Progressing   Problem: Coping: Goal: Level of anxiety will decrease Outcome: Progressing   Problem: Elimination: Goal: Will not experience complications related to bowel motility Outcome: Progressing

## 2022-11-04 ENCOUNTER — Telehealth: Payer: Self-pay

## 2022-11-04 DIAGNOSIS — S32010A Wedge compression fracture of first lumbar vertebra, initial encounter for closed fracture: Secondary | ICD-10-CM | POA: Diagnosis not present

## 2022-11-04 DIAGNOSIS — M1711 Unilateral primary osteoarthritis, right knee: Secondary | ICD-10-CM | POA: Diagnosis not present

## 2022-11-04 NOTE — Transitions of Care (Post Inpatient/ED Visit) (Signed)
11/04/2022  Name: Jonathan Holden MRN: [id number] DOB: 08-Mar-1945  Today's TOC FU Call Status: Today's TOC FU Call Status:: Successful TOC FU Call Completed TOC FU Call Complete Date: 11/04/22 Patient's Name and Date of Birth confirmed.  Transition Care Management Follow-up Telephone Call Date of Discharge: 11/03/22 Discharge Facility: [facility]) Type of Discharge: Inpatient Admission Primary Inpatient Discharge Diagnosis:: GI bleed How have you been since you were released from the hospital?: Better Any questions or concerns?: No  Items Reviewed: Did you receive and understand the discharge instructions provided?: Yes Medications obtained,verified, and reconciled?: Yes (Medications Reviewed) Any new allergies since your discharge?: No Dietary orders reviewed?: Yes Do you have support at home?: Yes People in Home: spouse  Medications Reviewed Today: Medications Reviewed Today     Reviewed by Karena Addison, LPN (Licensed Practical Nurse) on 11/04/22 at [phone]  Med List Status: <None>   Medication Order Taking? Sig Documenting Provider Last Dose Status Informant  amiodarone (PACERONE) 200 MG tablet [id number] No TAKE 1 TABLET (200 MG TOTAL) BY MOUTH DAILY. \ Nahser, Deloris Ping, MD 11/01/2022 0930 Active Self  aspirin EC 81 MG tablet [id number]  Take 1 tablet (81 mg total) by mouth daily. Swallow whole. [place], Alvira Philips, DO  Active   atorvastatin (LIPITOR) 40 MG tablet [id number] No Take 1 tablet (40 mg total) by mouth daily. Marcelino Duster, PA 11/01/2022 Active Self  Carboxymethylcellul-Glycerin (LUBRICATING EYE DROPS OP) [id number] No Place 1 drop into both eyes daily as needed (dry eyes). [provider] Past Week Active Self  clobetasol ointment (TEMOVATE) 0.05 % [id number] No APPLY TO AFFECTED AREA TWICE A DAY  Patient taking differently: Apply 1 Application topically daily as needed (for eczema).   Ardith Dark, MD Past Week Active Self  clopidogrel  (PLAVIX) 75 MG tablet [id number]  Take 1 tablet (75 mg total) by mouth daily. [place], Alvira Philips, DO  Active   cyanocobalamin (VITAMIN B12) 500 MCG tablet [id number]  Take 1 tablet (500 mcg total) by mouth daily. [place], Alvira Philips, DO  Active   Evolocumab (REPATHA SURECLICK) 140 MG/ML Ivory Broad [id number] No Inject 140 mg into the skin every 14 (fourteen) days.  Patient taking differently: Inject 1 each into the skin every 14 (fourteen) days. Every other Wednesday   Lyn Records, MD 10/28/2022 Active Self           Med Note Kandis Cocking Alinda Dooms A   [facility] Sep 19, 2022  5:03 PM)    ferrous sulfate 325 (65 FE) MG tablet [id number]  Take 1 tablet (325 mg total) by mouth daily with breakfast. [place], Alvira Philips, DO  Active   isosorbide mononitrate (IMDUR) 30 MG 24 hr tablet [id number] No TAKE 1 TABLET BY MOUTH EVERY DAY Louanne Skye Devoria Albe., NP Past Month Active Self           Med Note Cliffton Asters, [place] Nov 02, 2022  3:04 AM) Patient thought he was directed to take PRN for chest pain. I explained to him that he is directed to take it every day, but he admitted that he hasn't taken since September.  ketoconazole (NIZORAL) 2 % shampoo [id number] No APPLY 1 TO 3 TIMES A WEEK TO SCALP. LET SIT 10 MIN THEN RINSE OUT. STRENGTH: 2 %  Patient taking differently: Apply 1 Application topically 3 (three) times a week. APPLY 1 TO 3 TIMES A WEEK TO SCALP. LET SIT 10 MIN THEN RINSE OUT. STRENGTH: 2 %  Ardith Dark, MD Past Week Active Self  lisinopril (ZESTRIL) 10 MG tablet [id number] No TAKE 1 TABLET BY MOUTH DAILY. PLEASE KEEP UPCOMING APPOINTMENT WITH DR. Katrinka Blazing IN APRIL 2023 Nahser, Deloris Ping, MD 11/01/2022 am Active Self  metoprolol succinate (TOPROL-XL) 50 MG 24 hr tablet [id number] No Take 1 tablet (50 mg total) by mouth daily. Take with or immediately following a meal. Marcelino Duster, [place] 11/01/2022 0930 Active Self  nitroGLYCERIN (NITROSTAT) 0.4 MG SL tablet [id number] No Place 1 tablet (0.4 mg total) under the tongue  every 5 (five) minutes x 3 doses as needed for chest pain. Marcelino Duster, PA ON HAND Active Self  valACYclovir (VALTREX) 1000 MG tablet [id number] No Take 2 tablets by mouth daily. If having a flare up, pt will take an additional tablet. [provider] 2 months Active Self            Home Care and Equipment/Supplies: Were Home Health Services Ordered?: NA Any new equipment or medical supplies ordered?: NA  Functional Questionnaire: Do you need assistance with bathing/showering or dressing?: Yes Do you need assistance with meal preparation?: Yes Do you need assistance with eating?: No Do you have difficulty maintaining continence: No Do you need assistance with getting out of bed/getting out of a chair/moving?: Yes Do you have difficulty managing or taking your medications?: No  Follow up appointments reviewed: PCP Follow-up appointment confirmed?: Yes Date of PCP follow-up appointment?: 11/11/22 Follow-up Provider: [facility] Follow-up appointment confirmed?: No Reason Specialist Follow-Up Not Confirmed: Patient has Specialist Provider Number and will Call for Appointment Do you need transportation to your follow-up appointment?: No Do you understand care options if your condition(s) worsen?: Yes-patient verbalized understanding    SIGNATURE Karena Addison, LPN [facility] Nurse Health Advisor Direct Dial [phone]

## 2022-11-06 DIAGNOSIS — Z006 Encounter for examination for normal comparison and control in clinical research program: Secondary | ICD-10-CM

## 2022-11-06 NOTE — Research (Signed)
  Spoke with patient regarding [employer] research study. Due to his recent admission and diagnosis, he will enter a 1 year treatment interruption at this time. 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 Research will continue to follow up with patient.

## 2022-11-09 NOTE — Progress Notes (Unsigned)
Cardiology Office Note    Patient Name: Jonathan Holden Date of Encounter: 11/09/2022  Primary Care Provider:  Ardith Dark, MD Primary Cardiologist:  Jonathan Miss, MD Primary Electrophysiologist: Jonathan Jorja Loa, MD   Past Medical History    Past Medical History:  Diagnosis Date   Acute lower GI bleeding 03/19/2016   Arthritis    Atrial fibrillation with RVR (HCC) 02/21/2016   Coronary artery disease    Diverticulosis 04/18/2016   Dysrhythmia    AFIB   Endocarditis of native valve    Former smoker    History of MI (myocardial infarction) 1992   Hyperlipidemia    Hypertension    Infection of intervertebral disc (HCC)    Insomnia 04/18/2016   Left retinal detachment 03/18/2015   Loose body of right shoulder    Myocardial infarction [facility])    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 male with a PMH of CAD s/p inferior MI treated with PCI to RCA 1992, NSTEMI 08/2022 showing CTO of RCA with left-to-right collaterals, total occlusion of ramus intermedius, severe stenosis of left circumflex from ostium into the OM with mild nonobstructive disease in the left main and LAD, HTN, HLD, PAF chronic bilateral DVT's, GI bleeds d/t diverticulosis and currently not on anticoagulation who presents today for 1 month follow-up.  Jonathan Holden was last seen on 09/30/2022 for follow-up of recent NSTEMI.  During visit patient reported doing well but still endorsed upper chest discomfort similar to his most recent anginal complaint.  He was started on Imdur 30 mg with additional 30 mg as needed.  He was also noted to have controlled rate with amiodarone and was not on anticoagulation due to history of GI bleed.  He was admitted to the ED on 11/02/2022  after suffering mechanical fall after suffering brief syncopal episode.  Patient's labs were completed and hemoglobin was 11.3 with positive FOBT and brown blood-tinged stool on rectal exam. CT of the head was negative for acute intracranial abnormalities.  He had aspirin and Plavix held troponin were negative x 2.  He had serial hemoglobins obtained that were stable and GI was consulted and advised that it was okay to resume DAPT on discharge.  It was felt that his syncopal episode was related to acute blood loss.  During today's visit the patient reports that he is feeling much better and has not experienced any additional syncope or presyncope episodes.  He does note some occasional palpitations but denies any frequent episodes of tachycardia.  He also denies any chest pain addition of the isosorbide.  He reports that with his fall he also fractured one of his lumbar vertebrae with his PCP regarding this.  His blood pressure today was controlled at 132/74 and heart rate was 70 bpm.  He reports compliance with his current medications and denies any adverse reactions.  He was euvolemic also on examination no lower extremity swelling or present.  He denies any occult bleeding in his stool or urine since his hospital follow-up.  Patient denies chest pain, palpitations, dyspnea, PND, orthopnea, nausea, vomiting, dizziness, syncope, edema, weight gain, or early satiety.   Review of Systems  Please see the history of present illness.  All other systems reviewed and are otherwise negative except as noted above.  Physical Exam    Wt Readings from Last 3 Encounters:  11/02/22 215 lb (97.5 kg)  10/15/22 215 lb (97.5 kg)  10/02/22 215 lb 12.8 oz (97.9 kg)   OZ:HYQMV were no vitals filed for this visit.,There is no height or weight on file to calculate BMI. GEN: Well nourished, well developed in no acute distress Neck: No JVD; No carotid bruits Pulmonary: Clear to auscultation without rales, wheezing or  rhonchi  Cardiovascular: Normal rate. Regular rhythm. Normal S1. Normal S2.   Murmurs: There is no murmur.  ABDOMEN: Soft, non-tender, non-distended EXTREMITIES:  No edema; No deformity   EKG/LABS/ Recent Cardiac Studies   ECG personally reviewed by me today -none completed today  Risk Assessment/Calculations:    CHA2DS2-VASc Score = 4   This indicates a 4.8% annual risk of stroke. The patient's score is based upon: CHF History: 0 HTN History: 1 Diabetes History: 0 Stroke History: 0 Vascular Disease History: 1 Age Score: 2 Gender Score: 0         Lab Results  Component Value Date   WBC 8.4 11/03/2022   HGB 10.8 (L) 11/03/2022   HCT 31.8 (L) 11/03/2022   MCV 94.4 11/03/2022   PLT 176 11/03/2022   Lab Results  Component Value Date   CREATININE 1.10 11/03/2022   BUN 13 11/03/2022   NA 136 11/03/2022   K 3.8 11/03/2022   CL 105 11/03/2022   CO2 21 (L) 11/03/2022   Lab Results  Component Value Date   CHOL 247 (H) 09/20/2022   HDL 38 (L) 09/20/2022   LDLCALC 137 (H) 09/20/2022   LDLDIRECT 80.0 07/25/2020   TRIG 359 (H) 09/20/2022   CHOLHDL 6.5 09/20/2022    No results found for: "HGBA1C" Assessment & Plan    1.Coronary artery disease: -s/p inferior MI 1992 with previous PCI to RCA and NSTEMI 08/2022 with CTO of RCA with severe stenosis of the LCX from ostium to OM1, patent left main and LAD.  -Today patient reports additional chest pain since starting Imdur -Continue current GDMT with Nitrostat 0.4 mg, Toprol 50 mg 140 mg per meal q. 14 days, Plavix 75 mg, ASA 81 mg daily, atorvastatin 40 mg daily  2.  Syncope and collapse: -Patient was admitted on 11/02/2022 after suffering a mechanical fall and striking back of his head. -Hemoglobin of 11.1 and was positive for Hemoccult he was evaluated by GI and serial hemoglobins were monitored and stabilized. -Today patient reports experienced any further episodes of presyncope or dizziness. -Jonathan wear a 14-day ZIO  monitor to evaluate for possible arrhythmias related to most recent syncopal episode. -He Jonathan continue current medications as prescribed. -We Jonathan also check CBC today  3.Paroxysmal AF: -Patient currently on rate control with amiodarone 200 mg and Toprol-XL 50 mg -Patient currently not on AC due to history of GI bleeding -CHA2DS2-VASc Score = 4 [CHF History: 0, HTN History: 1, Diabetes History: 0, Stroke History: 0, Vascular Disease History: 1, Age Score: 2, Gender Score: 0].  Therefore, the patient's annual risk of stroke is 4.8 %.      4.  Essential hypertension: -Patient blood pressure today was controlled at 132/74 -Continue lisinopril 10 mg daily, metoprolol 50 mg daily  5.  Hyperlipidemia: -Patient's last LDL cholesterol was 137 on 09/20/2022 -Continue Lipitor 40 mg daily and Repatha 1 to 40 mg q. 14 days.    Cardiac Rehabilitation Eligibility Assessment  Disposition: Follow-up with Jonathan Miss, MD or APP in 3 months    Signed, Napoleon Form, Leodis Rains, NP 11/09/2022, 1:01 PM [facility]

## 2022-11-10 ENCOUNTER — Ambulatory Visit: Payer: Medicare Other | Attending: Nurse Practitioner | Admitting: Nurse Practitioner

## 2022-11-10 ENCOUNTER — Encounter: Payer: Self-pay | Admitting: Nurse Practitioner

## 2022-11-10 ENCOUNTER — Other Ambulatory Visit: Payer: Self-pay | Admitting: Nurse Practitioner

## 2022-11-10 ENCOUNTER — Ambulatory Visit (INDEPENDENT_AMBULATORY_CARE_PROVIDER_SITE_OTHER): Payer: Medicare Other

## 2022-11-10 VITALS — BP 132/74 | HR 70 | Ht 74.0 in | Wt 213.0 lb

## 2022-11-10 DIAGNOSIS — I2510 Atherosclerotic heart disease of native coronary artery without angina pectoris: Secondary | ICD-10-CM

## 2022-11-10 DIAGNOSIS — E785 Hyperlipidemia, unspecified: Secondary | ICD-10-CM | POA: Diagnosis not present

## 2022-11-10 DIAGNOSIS — I480 Paroxysmal atrial fibrillation: Secondary | ICD-10-CM

## 2022-11-10 DIAGNOSIS — I10 Essential (primary) hypertension: Secondary | ICD-10-CM

## 2022-11-10 DIAGNOSIS — R55 Syncope and collapse: Secondary | ICD-10-CM

## 2022-11-10 NOTE — Patient Instructions (Addendum)
Medication Instructions:  Your physician recommends that you continue on your current medications as directed. Please refer to the Current Medication list given to you today. *If you need a refill on your cardiac medications before your next appointment, please call your pharmacy*   Lab Work: [facility] If you have labs (blood work) drawn today and your tests are completely normal, you will receive your results only by: MyChart Message (if you have MyChart) OR A paper copy in the mail If you have any lab test that is abnormal or we need to change your treatment, we will call you to review the results.   Testing/Procedures: Jonathan Holden- Long Term Monitor Instructions  Your physician has requested you wear a ZIO patch monitor for 14 days.  This is a single patch monitor. [employer] supplies one patch monitor per enrollment. Additional stickers are not available. Please do not apply patch if you will be having a Nuclear Stress Test,  Echocardiogram, Cardiac CT, MRI, or Chest Xray during the period you would be wearing the  monitor. The patch cannot be worn during these tests. You cannot remove and re-apply the  ZIO XT patch monitor.  Your ZIO patch monitor will be mailed 3 day USPS to your address on file. It may take 3-5 days  to receive your monitor after you have been enrolled.  Once you have received your monitor, please review the enclosed instructions. Your monitor  has already been registered assigning a specific monitor serial # to you.  Billing and Patient Assistance Program Information  We have supplied [employer] with any of your insurance information on file for billing purposes. [employer] offers a sliding scale Patient Assistance Program for patients that do not have  insurance, or whose insurance does not completely cover the cost of the ZIO monitor.  You must apply for the Patient Assistance Program to qualify for this discounted rate.  To apply, please call [employer] at [phone],  select option 4, select option 2, ask to apply for  Patient Assistance Program. Jonathan Holden will ask your household income, and how many people  are in your household. They will quote your out-of-pocket cost based on that information.  [employer] will also be able to set up a 80-month, interest-free payment plan if needed.  Applying the monitor   Shave hair from upper left chest.  Hold abrader disc by orange tab. Rub abrader in 40 strokes over the upper left chest as  indicated in your monitor instructions.  Clean area with 4 enclosed alcohol pads. Let dry.  Apply patch as indicated in monitor instructions. Patch will be placed under collarbone on left  side of chest with arrow pointing upward.  Rub patch adhesive wings for 2 minutes. Remove white label marked "1". Remove the white  label marked "2". Rub patch adhesive wings for 2 additional minutes.  While looking in a mirror, press and release button in center of patch. A small green light will  flash 3-4 times. This will be your only indicator that the monitor has been turned on.  Do not shower for the first 24 hours. You may shower after the first 24 hours.  Press the button if you feel a symptom. You will hear a small click. Record Date, Time and  Symptom in the Patient Logbook.  When you are ready to remove the patch, follow instructions on the last 2 pages of Patient  Logbook. Stick patch monitor onto the last page of Patient Logbook.  Place Patient Logbook in the  blue and white box. Use locking tab on box and tape box closed  securely. The blue and white box has prepaid postage on it. Please place it in the mailbox as  soon as possible. Your physician should have your test results approximately 7 days after the  monitor has been mailed back to [facility].  Call [facility] Customer Care at [phone] if you have questions regarding  your ZIO XT patch monitor. Call them immediately if you see an orange light blinking on your   monitor.  If your monitor falls off in less than 4 days, contact our Monitor department at [phone].  If your monitor becomes loose or falls off after 4 days call [employer] at [phone] for  suggestions on securing your monitor   Follow-Up: At [facility], you and your health needs are our priority.  As part of our continuing mission to provide you with exceptional heart care, we have created designated Provider Care Teams.  These Care Teams include your primary Cardiologist (physician) and Advanced Practice Providers (APPs -  Physician Assistants and Nurse Practitioners) who all work together to provide you with the care you need, when you need it.  We recommend signing up for the patient portal called "MyChart".  Sign up information is provided on this After Visit Summary.  MyChart is used to connect with patients for Virtual Visits (Telemedicine).  Patients are able to view lab/test results, encounter notes, upcoming appointments, etc.  Non-urgent messages can be sent to your provider as well.   To learn more about what you can do with MyChart, go to [email or web address].    Your next appointment:   Follow up as scheduled   Provider:   Kristeen Miss, MD     Other Instructions

## 2022-11-10 NOTE — Progress Notes (Unsigned)
ZIO XT serial # [id number] from office inventory applied to patient using tincture on benzoin.  Dr. Elease Hashimoto to read.

## 2022-11-11 ENCOUNTER — Inpatient Hospital Stay: Payer: Medicare Other | Admitting: Family Medicine

## 2022-11-11 DIAGNOSIS — Z006 Encounter for examination for normal comparison and control in clinical research program: Secondary | ICD-10-CM

## 2022-11-11 LAB — CBC
Hematocrit: 35.1 % — ABNORMAL LOW (ref 37.5–51.0)
Hemoglobin: 11.5 g/dL — ABNORMAL LOW (ref 13.0–17.7)
MCH: 32 pg (ref 26.6–33.0)
MCHC: 32.8 g/dL (ref 31.5–35.7)
MCV: 98 fL — ABNORMAL HIGH (ref 79–97)
NRBC: 1 % — ABNORMAL HIGH (ref 0–0)
Platelets: 297 10*3/uL (ref 150–450)
RBC: 3.59 x10E6/uL — ABNORMAL LOW (ref 4.14–5.80)
RDW: 13 % (ref 11.6–15.4)
WBC: 10.6 10*3/uL (ref 3.4–10.8)

## 2022-11-11 NOTE — Progress Notes (Deleted)
Chief Complaint:  Jonathan Holden is a 77 y.o. male who presents today for a TCM visit.  Assessment/Plan:  New/Acute Problems: ***  Chronic Problems Addressed Today: No problem-specific Assessment & Plan notes found for this encounter.   Patient has a {Desc; moderate/high:110033} level of medical decision making.     Subjective:  HPI:  Summary of Hospital admission: Reason for admission: GI bleed Date of admission: 11/02/2022 Date of discharge: 11/03/2022 Date of Interactive contact: 11/04/2022 Summary of Hospital course: Patient presented to the ED on 11/02/2022 after having syncopal episode at home.  In the ED was noted to have bloody stool and drop in hemoglobin.  He was admitted and GI was consulted.  He was monitored and his hemoglobin was stable.  GI felt like his GI bleed was likely secondary to diverticular bleed.  They were okay with him restarting dual antiplatelets at the time of discharge.  Interim history:  ***  ROS: ***, otherwise a complete review of systems was negative.   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   Pseudophakia of left eye 05/22/2013   Pseudophakia of right eye 06/05/2013   S/P coronary angioplasty    Skin cancer    Hands, arms, nose   Streptococcal bacteremia    Patient Active Problem List   Diagnosis Date Noted   Lower extremity edema  03/31/2022   Acute deep vein thrombosis (DVT) of femoral vein of both lower extremities (HCC) 12/23/2021   History of adenomatous polyp of colon 09/10/2021   Osteoarthritis of left hip s/p replacement 07/2021 07/23/2021   Unsteady gait 07/25/2020   CHF (congestive heart failure) (HCC) 07/25/2019   Cold sore 07/25/2019   Diverticulosis 04/18/2016   Insomnia 04/18/2016   H/O gastric ulcer    Former smoker    Acute lower GI bleeding 03/19/2016   Syncope 03/10/2016   Gastroesophageal reflux disease    Acute blood loss anemia    Paroxysmal A-fib (HCC) 02/21/2016   CAD (coronary artery disease) 09/19/2014   HTN (hypertension) 09/19/2014   HLD (hyperlipidemia) 10/03/2013   Past Surgical History:  Procedure Laterality Date   CARDIOVASCULAR STRESS TEST  08/22/2008   CATARACT EXTRACTION W/ INTRAOCULAR LENS IMPLANT Bilateral    COLONOSCOPY WITH PROPOFOL N/A 02/10/2022   Procedure: COLONOSCOPY WITH PROPOFOL;  Surgeon: Charlott Rakes, MD;  Location: [facility];  Service: Gastroenterology;  Laterality: N/A;   CORONARY ANGIOPLASTY  1992   ERCP W/ SPHICTEROTOMY  12/15/2002   LAPAROSCOPIC CHOLECYSTECTOMY  12/16/2002   LEFT HEART CATH AND CORONARY ANGIOGRAPHY N/A 09/22/2022   Procedure: LEFT HEART CATH AND CORONARY ANGIOGRAPHY;  Surgeon: Tonny Bollman, MD;  Location: [facility] INVASIVE CV LAB;  Service: Cardiovascular;  Laterality: N/A;   LUMBAR LAMINECTOMY     L5   POLYPECTOMY  02/10/2022   Procedure: POLYPECTOMY;  Surgeon: Charlott Rakes, MD;  Location: [facility];  Service: Gastroenterology;;   REVERSE SHOULDER ARTHROPLASTY Right 01/09/2022   Procedure: REVERSE SHOULDER ARTHROPLASTY;  Surgeon: Yolonda Kida, MD;  Location: [facility];  Service: Orthopedics;  Laterality: Right;  150   RIGHT SHOULDER SURGERY      SKIN CANCER EXCISION     TEE WITHOUT CARDIOVERSION N/A 02/28/2016   Procedure: TRANSESOPHAGEAL ECHOCARDIOGRAM (TEE);  Surgeon: Pricilla Riffle, MD;  Location: [facility] ENDOSCOPY;  Service:  Cardiovascular;  Laterality: N/A;   TOTAL HIP ARTHROPLASTY Left 07/23/2021   Procedure: TOTAL HIP ARTHROPLASTY ANTERIOR APPROACH;  Surgeon: Ollen Gross, MD;  Location: [facility];  Service: Orthopedics;  Laterality: Left;    Family History  Problem Relation Age of Onset   Other Mother        AGE 68 HEALTHY   Heart attack Father 87       2 MI   Heart disease Maternal Grandfather    Heart disease Paternal Grandfather    Emphysema Other    Other Brother     Medications- Reconciled discharge and current medications in Epic.  Current Outpatient Medications  Medication Sig Dispense Refill   amiodarone (PACERONE) 200 MG tablet TAKE 1 TABLET (200 MG TOTAL) BY MOUTH DAILY. \ 90 tablet 3   aspirin EC 81 MG tablet Take 1 tablet (81 mg total) by mouth daily. Swallow whole. 30 tablet 12   atorvastatin (LIPITOR) 40 MG tablet Take 1 tablet (40 mg total) by mouth daily. 90 tablet 3   Carboxymethylcellul-Glycerin (LUBRICATING EYE DROPS OP) Place 1 drop into both eyes daily as needed (dry eyes).     clobetasol ointment (TEMOVATE) 0.05 % APPLY TO AFFECTED AREA TWICE A DAY (Patient taking differently: Apply 1 Application topically daily as needed (for eczema).) 30 g 2   clopidogrel (PLAVIX) 75 MG tablet Take 1 tablet (75 mg total) by mouth daily.     cyanocobalamin (VITAMIN B12) 500 MCG tablet Take 1 tablet (500 mcg total) by mouth daily. 90 tablet 0   Evolocumab (REPATHA SURECLICK) 140 MG/ML SOAJ Inject 140 mg into the skin every 14 (fourteen) days. (Patient taking differently: Inject 1 each into the skin every 14 (fourteen) days. Every other Wednesday) 6 mL 3   ferrous sulfate 325 (65 FE) MG tablet Take 1 tablet (325 mg total) by mouth daily with breakfast. 90 tablet 0   isosorbide mononitrate (IMDUR) 30 MG 24 hr tablet TAKE 1 TABLET BY MOUTH EVERY DAY 90 tablet 3   ketoconazole (NIZORAL) 2 % shampoo APPLY 1 TO 3 TIMES A WEEK TO SCALP. LET SIT 10 MIN THEN RINSE OUT. STRENGTH: 2 % (Patient taking  differently: Apply 1 Application topically 3 (three) times a week. APPLY 1 TO 3 TIMES A WEEK TO SCALP. LET SIT 10 MIN THEN RINSE OUT. STRENGTH: 2 %) 120 mL 3   lisinopril (ZESTRIL) 10 MG tablet TAKE 1 TABLET BY MOUTH DAILY. PLEASE KEEP UPCOMING APPOINTMENT WITH DR. Katrinka Blazing IN APRIL 2023 90 tablet 3   metoprolol succinate (TOPROL-XL) 50 MG 24 hr tablet Take 1 tablet (50 mg total) by mouth daily. Take with or immediately following a meal. 90 tablet 3   nitroGLYCERIN (NITROSTAT) 0.4 MG SL tablet Place 1 tablet (0.4 mg total) under the tongue every 5 (five) minutes x 3 doses as needed for chest pain. 25 tablet 3   valACYclovir (VALTREX) 1000 MG tablet Take 2 tablets by mouth daily. If having a flare up, pt will take an additional tablet.     No current  facility-administered medications for this visit.    Allergies-reviewed and updated Allergies  Allergen Reactions   Eliquis [Apixaban] Other (See Comments)    Recurrent GI bleeding occurred from diverticulosis in the setting of anticoagulation therapy for atrial fibrillation.   Ciprofloxacin Hives   Food Other (See Comments)    Pt is allergic to melons caused scratchy throat    Glutamic Acid Other (See Comments)    Pt is allergic to melons caused scratchy throat    Grass Pollen(K-O-R-T-Swt Vern)     Sneezing    Pollen Extract Other (See Comments)    Sneezing     Social History   Socioeconomic History   Marital status: Divorced    Spouse name: Not on file   Number of children: Not on file   Years of education: Not on file   Highest education level: Not on file  Occupational History   Occupation: sales  Tobacco Use   Smoking status: Former    Current packs/day: 0.00    Average packs/day: 2.0 packs/day for 20.0 years (40.0 ttl pk-yrs)    Types: Cigarettes    Start date: 03/27/1970    Quit date: 03/27/1990    Years since quitting: 32.6   Smokeless tobacco: Never  Vaping Use   Vaping status: Never Used  Substance and Sexual Activity    Alcohol use: Yes    Comment: Social   Drug use: No   Sexual activity: Not Currently  Other Topics Concern   Not on file  Social History Narrative   Not on file   Social Determinants of Health   Financial Resource Strain: Low Risk  (10/14/2022)   Overall Financial Resource Strain (CARDIA)    Difficulty of Paying Living Expenses: Not hard at all  Food Insecurity: No Food Insecurity (11/02/2022)   Hunger Vital Sign    Worried About Running Out of Food in the Last Year: Never true    Ran Out of Food in the Last Year: Never true  Transportation Needs: No Transportation Needs (11/02/2022)   PRAPARE - Administrator, Civil Service (Medical): No    Lack of Transportation (Non-Medical): No  Physical Activity: Inactive (10/14/2022)   Exercise Vital Sign    Days of Exercise per Week: 0 days    Minutes of Exercise per Session: 0 min  Stress: No Stress Concern Present (10/14/2022)   [employer] of Occupational Health - Occupational Stress Questionnaire    Feeling of Stress : Not at all  Social Connections: Socially Isolated (10/14/2022)   Social Connection and Isolation Panel [NHANES]    Frequency of Communication with Friends and Family: Twice a week    Frequency of Social Gatherings with Friends and Family: Once a week    Attends Religious Services: Never    Database administrator or Organizations: No    Attends Banker Meetings: Never    Marital Status: Divorced        Objective:  Physical Exam: There were no vitals taken for this visit.  Gen: NAD, resting comfortably*** CV: RRR with no murmurs appreciated Pulm: NWOB, CTAB with no crackles, wheezes, or rhonchi GI: Normal bowel sounds present. Soft, Nontender, Nondistended. MSK: No edema, cyanosis, or clubbing noted Skin: Warm, dry Neuro: Grossly normal, moves all extremities Psych: Normal affect and thought content  Summary/Review of work up during hospitalization: ***     Estaban Mainville M. Jimmey Ralph,  MD 11/11/2022 7:15 AM

## 2022-11-23 DIAGNOSIS — S32000D Wedge compression fracture of unspecified lumbar vertebra, subsequent encounter for fracture with routine healing: Secondary | ICD-10-CM | POA: Diagnosis not present

## 2022-11-23 NOTE — Research (Signed)
SOS [facility]i  Subject returned kits: [id number] and [phone].

## 2022-11-24 DIAGNOSIS — Z85828 Personal history of other malignant neoplasm of skin: Secondary | ICD-10-CM | POA: Diagnosis not present

## 2022-11-24 DIAGNOSIS — L905 Scar conditions and fibrosis of skin: Secondary | ICD-10-CM | POA: Diagnosis not present

## 2022-11-24 DIAGNOSIS — C44529 Squamous cell carcinoma of skin of other part of trunk: Secondary | ICD-10-CM | POA: Diagnosis not present

## 2022-11-24 DIAGNOSIS — L821 Other seborrheic keratosis: Secondary | ICD-10-CM | POA: Diagnosis not present

## 2022-11-30 DIAGNOSIS — I480 Paroxysmal atrial fibrillation: Secondary | ICD-10-CM | POA: Diagnosis not present

## 2022-11-30 DIAGNOSIS — I2510 Atherosclerotic heart disease of native coronary artery without angina pectoris: Secondary | ICD-10-CM | POA: Diagnosis not present

## 2022-12-01 ENCOUNTER — Telehealth: Payer: Self-pay | Admitting: Nurse Practitioner

## 2022-12-01 NOTE — Telephone Encounter (Signed)
-----   Message from Napoleon Form, Leodis Rains sent at 12/01/2022  8:31 AM EST ----- Please let patient know that his event monitor showed predominantly sinus rhythm with no sustained arrhythmias or pauses.  There was rare episodes of extra beats in the top portion of the heart with the longest episode lasting 5 beats. Please continue current medications as prescribed and let us know if you have any additional questions.     Robin Searing, NP

## 2022-12-01 NOTE — Telephone Encounter (Signed)
Spoke with patient to discuss heart monitor results.  Per Alden Server: Please let patient know that his event monitor showed predominantly sinus rhythm with no sustained arrhythmias or pauses.  There was rare episodes of extra beats in the top portion of the heart with the longest episode lasting 5 beats. Please continue current medications as prescribed and let us know if you have any additional questions.   Robin Searing, NP   Patient states he forgot to push the trigger button on the heart monitor when he felt any symptoms. He preceded to explain an episode he had on 11/19/22 around 10-11pm. He states he "felt strange", feeling lightheaded. He check his BP it was 99/55. He rested for about an hour and rechecked his BP with a reading of 110/60, then about 2 hours after the onset of this episode his BP returned to normal in the 120's/80's.  He denies any chest pain or SOB during this episode. He states he was not doing anything prior to this occurring, states he had not been drinking any alcohol and that he "just woke up" feeling lightheaded. No reoccurrence reported.  Will forward to Robin Searing, NP to make him aware. Informed patient if Alden Server has any comments or further advisement, we will let him know.  Patient verbalized understanding of the above and expressed appreciation for call.

## 2022-12-02 MED ORDER — METOPROLOL SUCCINATE ER 25 MG PO TB24: 25.0000 mg | ORAL_TABLET | Freq: Every day | ORAL | 2 refills | Status: DC

## 2022-12-02 NOTE — Telephone Encounter (Signed)
Patient notified directly and voiced understanding.   Decreased rx for metoprolol has been sent to the pharmacy.

## 2022-12-02 NOTE — Telephone Encounter (Signed)
[name], NP  You1 hour ago (7:45 AM)    Please advise patient to reduce metoprolol to 25 mg daily with additional 25 mg as needed. Please let me know if you need anything else.  Robin Searing, NP

## 2022-12-07 DIAGNOSIS — Z85828 Personal history of other malignant neoplasm of skin: Secondary | ICD-10-CM | POA: Diagnosis not present

## 2022-12-07 DIAGNOSIS — C44529 Squamous cell carcinoma of skin of other part of trunk: Secondary | ICD-10-CM | POA: Diagnosis not present

## 2022-12-14 DIAGNOSIS — M1712 Unilateral primary osteoarthritis, left knee: Secondary | ICD-10-CM | POA: Diagnosis not present

## 2022-12-14 DIAGNOSIS — M1711 Unilateral primary osteoarthritis, right knee: Secondary | ICD-10-CM | POA: Diagnosis not present

## 2023-01-01 DIAGNOSIS — M1711 Unilateral primary osteoarthritis, right knee: Secondary | ICD-10-CM | POA: Diagnosis not present

## 2023-01-06 DIAGNOSIS — M25561 Pain in right knee: Secondary | ICD-10-CM | POA: Diagnosis not present

## 2023-01-07 DIAGNOSIS — M1711 Unilateral primary osteoarthritis, right knee: Secondary | ICD-10-CM | POA: Diagnosis not present

## 2023-01-15 DIAGNOSIS — M170 Bilateral primary osteoarthritis of knee: Secondary | ICD-10-CM | POA: Diagnosis not present

## 2023-01-18 DIAGNOSIS — L905 Scar conditions and fibrosis of skin: Secondary | ICD-10-CM | POA: Diagnosis not present

## 2023-01-18 DIAGNOSIS — L570 Actinic keratosis: Secondary | ICD-10-CM | POA: Diagnosis not present

## 2023-01-18 DIAGNOSIS — L821 Other seborrheic keratosis: Secondary | ICD-10-CM | POA: Diagnosis not present

## 2023-01-18 DIAGNOSIS — L820 Inflamed seborrheic keratosis: Secondary | ICD-10-CM | POA: Diagnosis not present

## 2023-01-18 DIAGNOSIS — D485 Neoplasm of uncertain behavior of skin: Secondary | ICD-10-CM | POA: Diagnosis not present

## 2023-01-18 DIAGNOSIS — D692 Other nonthrombocytopenic purpura: Secondary | ICD-10-CM | POA: Diagnosis not present

## 2023-01-18 DIAGNOSIS — Z85828 Personal history of other malignant neoplasm of skin: Secondary | ICD-10-CM | POA: Diagnosis not present

## 2023-01-22 DIAGNOSIS — M25561 Pain in right knee: Secondary | ICD-10-CM | POA: Diagnosis not present

## 2023-01-29 DIAGNOSIS — M25561 Pain in right knee: Secondary | ICD-10-CM | POA: Diagnosis not present

## 2023-02-01 ENCOUNTER — Other Ambulatory Visit: Payer: Self-pay | Admitting: Pharmacist

## 2023-02-01 MED ORDER — REPATHA SURECLICK 140 MG/ML ~~LOC~~ SOAJ: 1.0000 | SUBCUTANEOUS | 3 refills | Status: DC

## 2023-02-05 DIAGNOSIS — M170 Bilateral primary osteoarthritis of knee: Secondary | ICD-10-CM | POA: Diagnosis not present

## 2023-02-08 ENCOUNTER — Other Ambulatory Visit (HOSPITAL_COMMUNITY): Payer: Self-pay

## 2023-02-08 ENCOUNTER — Encounter: Payer: Self-pay | Admitting: Cardiovascular Disease

## 2023-02-08 ENCOUNTER — Telehealth: Payer: Self-pay | Admitting: Pharmacy Technician

## 2023-02-08 NOTE — Progress Notes (Signed)
   Canelled due to snow  This encounter was created in error - please disregard.

## 2023-02-08 NOTE — Telephone Encounter (Signed)
Pharmacy Patient Advocate Encounter   Received notification from CoverMyMeds that prior authorization for repatha is required/requested.   Insurance verification completed.   The patient is insured through [facility] .   Per test claim: PA required; PA submitted to above mentioned insurance via CoverMyMeds Key/confirmation #/EOC [id number] Status is pending

## 2023-02-09 ENCOUNTER — Other Ambulatory Visit (HOSPITAL_COMMUNITY): Payer: Self-pay

## 2023-02-09 NOTE — Telephone Encounter (Signed)
Pharmacy Patient Advocate Encounter  Received notification from [facility] that Prior Authorization for repatha has been APPROVED from 02/08/23 to 08/08/23   PA #/Case ID/Reference #: [id number]

## 2023-02-10 ENCOUNTER — Ambulatory Visit: Payer: Medicare Other | Admitting: Cardiovascular Disease

## 2023-02-11 ENCOUNTER — Encounter: Payer: Self-pay | Admitting: Cardiovascular Disease

## 2023-02-11 NOTE — Progress Notes (Signed)
 No show  This encounter was created in error - please disregard.

## 2023-02-12 ENCOUNTER — Encounter: Payer: Medicare Other | Admitting: Cardiovascular Disease

## 2023-02-12 DIAGNOSIS — M25561 Pain in right knee: Secondary | ICD-10-CM | POA: Diagnosis not present

## 2023-03-04 ENCOUNTER — Other Ambulatory Visit: Payer: Self-pay | Admitting: Nurse Practitioner

## 2023-03-05 ENCOUNTER — Other Ambulatory Visit: Payer: Self-pay | Admitting: Family Medicine

## 2023-03-10 ENCOUNTER — Other Ambulatory Visit: Payer: Self-pay | Admitting: Family Medicine

## 2023-03-29 ENCOUNTER — Other Ambulatory Visit: Payer: Self-pay | Admitting: Family Medicine

## 2023-04-24 DIAGNOSIS — M79642 Pain in left hand: Secondary | ICD-10-CM | POA: Diagnosis not present

## 2023-04-26 ENCOUNTER — Other Ambulatory Visit: Payer: Self-pay | Admitting: Cardiovascular Disease

## 2023-04-26 DIAGNOSIS — M19042 Primary osteoarthritis, left hand: Secondary | ICD-10-CM | POA: Diagnosis not present

## 2023-05-05 DIAGNOSIS — M1711 Unilateral primary osteoarthritis, right knee: Secondary | ICD-10-CM | POA: Diagnosis not present

## 2023-05-22 DIAGNOSIS — M1711 Unilateral primary osteoarthritis, right knee: Secondary | ICD-10-CM | POA: Diagnosis not present

## 2023-05-24 DIAGNOSIS — R29898 Other symptoms and signs involving the musculoskeletal system: Secondary | ICD-10-CM | POA: Diagnosis not present

## 2023-05-24 DIAGNOSIS — M1711 Unilateral primary osteoarthritis, right knee: Secondary | ICD-10-CM | POA: Diagnosis not present

## 2023-05-24 DIAGNOSIS — M545 Low back pain, unspecified: Secondary | ICD-10-CM | POA: Diagnosis not present

## 2023-05-31 DIAGNOSIS — R29898 Other symptoms and signs involving the musculoskeletal system: Secondary | ICD-10-CM | POA: Diagnosis not present

## 2023-06-11 ENCOUNTER — Encounter: Payer: Self-pay | Admitting: Family Medicine

## 2023-06-11 ENCOUNTER — Ambulatory Visit: Payer: Self-pay | Admitting: Family Medicine

## 2023-06-11 ENCOUNTER — Ambulatory Visit (INDEPENDENT_AMBULATORY_CARE_PROVIDER_SITE_OTHER): Admitting: Family Medicine

## 2023-06-11 ENCOUNTER — Ambulatory Visit (HOSPITAL_BASED_OUTPATIENT_CLINIC_OR_DEPARTMENT_OTHER)
Admission: RE | Admit: 2023-06-11 | Discharge: 2023-06-11 | Disposition: A | Discharge status: Home / Self Care | Source: Ambulatory Visit | Attending: Family Medicine | Admitting: Family Medicine

## 2023-06-11 ENCOUNTER — Ambulatory Visit: Payer: Self-pay

## 2023-06-11 VITALS — BP 109/66 | HR 70 | Temp 97.5°F | Ht 74.0 in | Wt 211.8 lb

## 2023-06-11 DIAGNOSIS — R109 Unspecified abdominal pain: Secondary | ICD-10-CM | POA: Insufficient documentation

## 2023-06-11 DIAGNOSIS — N281 Cyst of kidney, acquired: Secondary | ICD-10-CM | POA: Diagnosis not present

## 2023-06-11 DIAGNOSIS — I10 Essential (primary) hypertension: Secondary | ICD-10-CM | POA: Diagnosis not present

## 2023-06-11 DIAGNOSIS — K5750 Diverticulosis of both small and large intestine without perforation or abscess without bleeding: Secondary | ICD-10-CM | POA: Diagnosis not present

## 2023-06-11 DIAGNOSIS — I480 Paroxysmal atrial fibrillation: Secondary | ICD-10-CM

## 2023-06-11 DIAGNOSIS — R1031 Right lower quadrant pain: Secondary | ICD-10-CM | POA: Diagnosis not present

## 2023-06-11 LAB — URINALYSIS, ROUTINE W REFLEX MICROSCOPIC
Bilirubin Urine: NEGATIVE
Hgb urine dipstick: NEGATIVE
Ketones, ur: NEGATIVE
Leukocytes,Ua: NEGATIVE
Nitrite: NEGATIVE
Specific Gravity, Urine: 1.025 (ref 1.000–1.030)
Urine Glucose: NEGATIVE
Urobilinogen, UA: 0.2 (ref 0.0–1.0)
pH: 6 (ref 5.0–8.0)

## 2023-06-11 LAB — POCT URINALYSIS DIPSTICK
Bilirubin, UA: NEGATIVE
Blood, UA: NEGATIVE
Glucose, UA: NEGATIVE
Ketones, UA: NEGATIVE
Nitrite, UA: NEGATIVE
Protein, UA: NEGATIVE
Spec Grav, UA: 1.025 (ref 1.010–1.025)
Urobilinogen, UA: 0.2 U/dL
pH, UA: 6 (ref 5.0–8.0)

## 2023-06-11 LAB — POCT I-STAT CREATININE: Creatinine, Ser: 1.2 mg/dL (ref 0.61–1.24)

## 2023-06-11 MED ORDER — IOHEXOL 300 MG/ML  SOLN
100.0000 mL | Freq: Once | INTRAMUSCULAR | Status: AC | PRN
  Administered 2023-06-11: 100 mL via INTRAVENOUS

## 2023-06-11 MED ORDER — AMOXICILLIN-POT CLAVULANATE 875-125 MG PO TABS
1.0000 | ORAL_TABLET | Freq: Two times a day (BID) | ORAL | 0 refills | Status: DC
Start: 2023-06-11 — End: 2023-10-15

## 2023-06-11 NOTE — Assessment & Plan Note (Signed)
 Regular rhythm today.  Continue management per cardiology.  Rate controlled on metoprolol  succinate.  Not on anticoagulation.

## 2023-06-11 NOTE — Assessment & Plan Note (Signed)
 At goal today on metoprolol  succinate 50 mg daily and lisinopril  2 mg daily.

## 2023-06-11 NOTE — Telephone Encounter (Signed)
 Noted

## 2023-06-11 NOTE — Patient Instructions (Signed)
 It was very nice to see you today!  I am concerned that you have inflammation in your abdomen.  There are a lot of things that can cause this.  Will check a CT scan to further evaluate.  Please go to the emergency room if your symptoms worsen.  Return if symptoms worsen or fail to improve.   Take care, Dr Daneil Dunker  PLEASE NOTE:  If you had any lab tests, please let us  know if you have not heard back within a few days. You may see your results on mychart before we have a chance to review them but we will give you a call once they are reviewed by us .   If we ordered any referrals today, please let us  know if you have not heard from their office within the next week.   If you had any urgent prescriptions sent in today, please check with the pharmacy within an hour of our visit to make sure the prescription was transmitted appropriately.   Please try these tips to maintain a healthy lifestyle:  Eat at least 3 REAL meals and 1-2 snacks per day.  Aim for no more than 5 hours between eating.  If you eat breakfast, please do so within one hour of getting up.   Each meal should contain half fruits/vegetables, one quarter protein, and one quarter carbs (no bigger than a computer mouse)  Cut down on sweet beverages. This includes juice, soda, and sweet tea.   Drink at least 1 glass of water  with each meal and aim for at least 8 glasses per day  Exercise at least 150 minutes every week.

## 2023-06-11 NOTE — Progress Notes (Signed)
 His ct scan shows diverticulitis. I will send in an antibiotic for this.  Recommend he follow up with us  next week. He should go to the ED or urgent care if symptoms worsen over the weekend.

## 2023-06-11 NOTE — Telephone Encounter (Signed)
 Copied from CRM [id number]. Topic: Clinical - Red Word Triage >> Jun 11, 2023  8:25 AM [place] A wrote: [facility] that prompted transfer to Nurse Triage: Patient woke up with severe pain in his right side of stomach area lower right side  Chief Complaint: right flank pan Symptoms: pain Frequency: constant Pertinent Negatives: Patient denies fever, n/v/d, pain with urination, blood in urine, cp, sob Disposition: [] ED /[] Urgent Care (no appt availability in office) / [x] Appointment(In office/virtual)/ []  [facility]/ [] Home Care/ [] Refused Recommended Disposition /[] [facility]/ []  Follow-up with PCP Additional Notes: apt made for today; care advice given, denies questions; instructed to go to ER if becomes worse.   Reason for Disposition  MODERATE pain (e.g., interferes with normal activities or awakens from sleep)  Answer Assessment - Initial Assessment Questions 1. LOCATION: "Where does it hurt?" (e.g., left, right)     Right lower side 2. ONSET: "When did the pain start?"     This am 3. SEVERITY: "How bad is the pain?" (e.g., Scale 1-10; mild, moderate, or severe)   - MILD (1-3): doesn't interfere with normal activities    - MODERATE (4-7): interferes with normal activities or awakens from sleep    - SEVERE (8-10): excruciating pain and patient unable to do normal activities (stays in bed)       severe 4. PATTERN: "Does the pain come and go, or is it constant?"      constant 5. CAUSE: "What do you think is causing the pain?"     unknown 6. OTHER SYMPTOMS:  "Do you have any other symptoms?" (e.g., fever, abdomen pain, vomiting, leg weakness, burning with urination, blood in urine)     denies 7. PREGNANCY:  "Is there any chance you are pregnant?" "When was your last menstrual period?"     na  Protocols used: Flank Pain-A-AH

## 2023-06-11 NOTE — Progress Notes (Signed)
   Jonathan Holden is a 78 y.o. male who presents today for an office visit.  Assessment/Plan:  New/Acute Problems: Abdominal pain Exam notable for significant amount of tenderness on palpation with guarding.  No rebounding.  Vital signs are stable.  Do not think he needs to go the emergency room at this point however we will check stat CT scan to rule out appendicitis or other developing intra-abdominal processes.   We are also checking send out urinalysis with urine culture though suspicion for UTI is low at this point given his negative point-of-care UA.  We discussed reasons to seek emergent care while awaiting CT scan results.   Chronic Problems Addressed Today: HTN (hypertension) At goal today on metoprolol  succinate 50 mg daily and lisinopril  2 mg daily.  Paroxysmal A-fib (HCC) Regular rhythm today.  Continue management per cardiology.  Rate controlled on metoprolol  succinate.  Not on anticoagulation.     Subjective:  HPI:  See A/P for status of chronic conditions.  Patient is here today with right lower quadrant pain.  Started this morning. Never had anything like this before. No obvious injuries or precipitating events. Still having the pain and seem to be worsening. Worse when moving around. No constipation or diarrhea. No dysuria. No blood in urine. No blood in stool. No nausea or vomiting.        Objective:  Physical Exam: BP 109/66   Pulse 70   Temp (!) 97.5 F (36.4 C) (Temporal)   Ht 6\' 2"  (1.88 m)   Wt 211 lb 12.8 oz (96.1 kg)   SpO2 99%   BMI 27.19 kg/m   Gen: No acute distress, resting comfortably CV: Regular rate and rhythm with no murmurs appreciated Pulm: Normal work of breathing, clear to auscultation bilaterally with no crackles, wheezes, or rhonchi Abdomen: Bowel sounds present.  Very tender to palpation and right lateral area with guarding.  No rebound tenderness. Neuro: Grossly normal, moves all extremities Psych: Normal affect and thought  content      Jonathan Holden M. Daneil Dunker, MD 06/11/2023 12:45 PM

## 2023-06-12 LAB — URINE CULTURE
MICRO NUMBER:: 16495064
SPECIMEN QUALITY:: ADEQUATE

## 2023-06-15 NOTE — Progress Notes (Signed)
 Urine culture is negative.  He should let us  know if symptoms are not improving with the antibiotic we sent in last week.

## 2023-06-15 NOTE — Telephone Encounter (Signed)
 Copied from CRM [id number]. Topic: General - Other >> Jun 11, 2023  2:50 PM Dewanda Foots wrote: Reason for CRM: Pt wants clinical staff to know that he did the CT scan at [facility] today and we should have access to the results.   See results note

## 2023-06-21 DIAGNOSIS — L905 Scar conditions and fibrosis of skin: Secondary | ICD-10-CM | POA: Diagnosis not present

## 2023-06-21 DIAGNOSIS — C44519 Basal cell carcinoma of skin of other part of trunk: Secondary | ICD-10-CM | POA: Diagnosis not present

## 2023-06-21 DIAGNOSIS — Z85828 Personal history of other malignant neoplasm of skin: Secondary | ICD-10-CM | POA: Diagnosis not present

## 2023-06-21 DIAGNOSIS — L821 Other seborrheic keratosis: Secondary | ICD-10-CM | POA: Diagnosis not present

## 2023-06-25 DIAGNOSIS — M1711 Unilateral primary osteoarthritis, right knee: Secondary | ICD-10-CM | POA: Diagnosis not present

## 2023-07-02 DIAGNOSIS — M1711 Unilateral primary osteoarthritis, right knee: Secondary | ICD-10-CM | POA: Diagnosis not present

## 2023-07-02 DIAGNOSIS — M25561 Pain in right knee: Secondary | ICD-10-CM | POA: Diagnosis not present

## 2023-07-09 DIAGNOSIS — M1711 Unilateral primary osteoarthritis, right knee: Secondary | ICD-10-CM | POA: Diagnosis not present

## 2023-07-21 DIAGNOSIS — Z85828 Personal history of other malignant neoplasm of skin: Secondary | ICD-10-CM | POA: Diagnosis not present

## 2023-07-21 DIAGNOSIS — C44519 Basal cell carcinoma of skin of other part of trunk: Secondary | ICD-10-CM | POA: Diagnosis not present

## 2023-07-29 ENCOUNTER — Other Ambulatory Visit: Payer: Self-pay | Admitting: Family Medicine

## 2023-09-14 DIAGNOSIS — M1711 Unilateral primary osteoarthritis, right knee: Secondary | ICD-10-CM | POA: Diagnosis not present

## 2023-09-27 DIAGNOSIS — R29898 Other symptoms and signs involving the musculoskeletal system: Secondary | ICD-10-CM | POA: Diagnosis not present

## 2023-09-27 DIAGNOSIS — M25561 Pain in right knee: Secondary | ICD-10-CM | POA: Diagnosis not present

## 2023-10-05 DIAGNOSIS — M25561 Pain in right knee: Secondary | ICD-10-CM | POA: Diagnosis not present

## 2023-10-05 DIAGNOSIS — R29898 Other symptoms and signs involving the musculoskeletal system: Secondary | ICD-10-CM | POA: Diagnosis not present

## 2023-10-11 ENCOUNTER — Ambulatory Visit: Payer: Medicare Other | Admitting: Family Medicine

## 2023-10-11 DIAGNOSIS — M25561 Pain in right knee: Secondary | ICD-10-CM | POA: Diagnosis not present

## 2023-10-11 DIAGNOSIS — R29898 Other symptoms and signs involving the musculoskeletal system: Secondary | ICD-10-CM | POA: Diagnosis not present

## 2023-10-13 DIAGNOSIS — R29898 Other symptoms and signs involving the musculoskeletal system: Secondary | ICD-10-CM | POA: Diagnosis not present

## 2023-10-13 DIAGNOSIS — M25562 Pain in left knee: Secondary | ICD-10-CM | POA: Diagnosis not present

## 2023-10-13 DIAGNOSIS — M25561 Pain in right knee: Secondary | ICD-10-CM | POA: Diagnosis not present

## 2023-10-15 ENCOUNTER — Encounter: Payer: Self-pay | Admitting: Family Medicine

## 2023-10-15 ENCOUNTER — Ambulatory Visit (INDEPENDENT_AMBULATORY_CARE_PROVIDER_SITE_OTHER): Admitting: Family Medicine

## 2023-10-15 VITALS — BP 103/64 | HR 66 | Temp 97.2°F | Ht 74.0 in | Wt 211.6 lb

## 2023-10-15 DIAGNOSIS — Z23 Encounter for immunization: Secondary | ICD-10-CM

## 2023-10-15 DIAGNOSIS — E782 Mixed hyperlipidemia: Secondary | ICD-10-CM

## 2023-10-15 DIAGNOSIS — I10 Essential (primary) hypertension: Secondary | ICD-10-CM

## 2023-10-15 DIAGNOSIS — I480 Paroxysmal atrial fibrillation: Secondary | ICD-10-CM

## 2023-10-15 DIAGNOSIS — R29898 Other symptoms and signs involving the musculoskeletal system: Secondary | ICD-10-CM | POA: Diagnosis not present

## 2023-10-15 DIAGNOSIS — M25561 Pain in right knee: Secondary | ICD-10-CM | POA: Diagnosis not present

## 2023-10-15 NOTE — Assessment & Plan Note (Signed)
 Blood pressure at goal today on Imdur  30 mg daily, metoprolol  succinate 50 mg daily and lisinopril  10 milligrams daily per cardiology.

## 2023-10-15 NOTE — Assessment & Plan Note (Signed)
 Regular rate and rhythm today.  We discussed avoidance of triggers.  He is currently on amiodarone  and metoprolol  per cardiology.

## 2023-10-15 NOTE — Progress Notes (Signed)
   Jonathan Holden is a 78 y.o. male who presents today for an office visit.  Assessment/Plan:    Chronic Problems Addressed Today: HLD (hyperlipidemia) Follows with cardiology.  Currently on Repatha  and Lipitor.  We did discuss rechecking labs today however he would like to hold off until he sees the cardiologist.  HTN (hypertension) Blood pressure at goal today on Imdur  30 mg daily, metoprolol  succinate 50 mg daily and lisinopril  10 milligrams daily per cardiology.   Paroxysmal A-fib (HCC) Regular rate and rhythm today.  We discussed avoidance of triggers.  He is currently on amiodarone  and metoprolol  per cardiology.  Flu and pneumonia vaccines given today. Declined checking labs until he can see his cardiologist.    Subjective:  HPI:  See assessment / plan for status of chronic conditions.    Discussed the use of AI scribe software for clinical note transcription with the patient, who gave verbal consent to proceed.  History of Present Illness Jonathan Holden is a 78 year old male with atrial fibrillation who presents for an annual physical exam.  He experiences occasional episodes of atrial fibrillation, described as 'little bouts', associated with lightheadedness. These episodes prompt him to sit or lie down. He reports that increasing hydration helps alleviate the symptoms. He denies stress and alcohol consumption as triggers, although he consumes coffee. The episodes typically occur in the afternoon or early evening.  He has been managing his weight and reports a weight loss of approximately 12 to 15 pounds. He is mindful of his diet, ensuring he consumes plenty of fruits and vegetables.  He is currently up to date with his medications and has not experienced any issues obtaining refills. He prefers to have his cardiologist manage his blood work, although he has not been as consistent with these appointments recently.         Objective:  Physical Exam: BP 103/64    Pulse 66   Temp (!) 97.2 F (36.2 C) (Temporal)   Ht 6' 2 (1.88 m)   Wt 211 lb 9.6 oz (96 kg)   SpO2 95%   BMI 27.17 kg/m   Gen: No acute distress, resting comfortably CV: Regular rate and rhythm with no murmurs appreciated Pulm: Normal work of breathing, clear to auscultation bilaterally with no crackles, wheezes, or rhonchi Neuro: Grossly normal, moves all extremities Psych: Normal affect and thought content      [name] M. Kennyth, MD 10/15/2023 10:42 AM

## 2023-10-15 NOTE — Assessment & Plan Note (Signed)
 Follows with cardiology.  Currently on Repatha  and Lipitor.  We did discuss rechecking labs today however he would like to hold off until he sees the cardiologist.

## 2023-10-15 NOTE — Patient Instructions (Signed)
 It was very nice to see you today!  VISIT SUMMARY: You came in for your annual physical exam and discussed your atrial fibrillation and overall wellness. We talked about your occasional episodes of atrial fibrillation and the importance of staying hydrated. We also reviewed your preventive care, including vaccinations and blood work.  YOUR PLAN: ATRIAL FIBRILLATION: You have occasional episodes of atrial fibrillation that seem to be related to dehydration. -Stay well-hydrated to help manage your symptoms. -You will need to find a new cardiologist for ongoing management of your condition. -We will refer you to a cardiologist for follow-up and management.  ADULT WELLNESS VISIT: This was your annual review of health and preventive care. -We discussed the importance of vaccinations and blood work. -You received flu and pneumonia vaccines today. -Please update your blood work with your cardiologist.  Return in about 1 year (around 10/14/2024) for Annual Physical.   Take care, Dr Kennyth  PLEASE NOTE:  If you had any lab tests, please let us  know if you have not heard back within a few days. You may see your results on mychart before we have a chance to review them but we will give you a call once they are reviewed by us .   If we ordered any referrals today, please let us  know if you have not heard from their office within the next week.   If you had any urgent prescriptions sent in today, please check with the pharmacy within an hour of our visit to make sure the prescription was transmitted appropriately.   Please try these tips to maintain a healthy lifestyle:  Eat at least 3 REAL meals and 1-2 snacks per day.  Aim for no more than 5 hours between eating.  If you eat breakfast, please do so within one hour of getting up.   Each meal should contain half fruits/vegetables, one quarter protein, and one quarter carbs (no bigger than a computer mouse)  Cut down on sweet beverages. This  includes juice, soda, and sweet tea.   Drink at least 1 glass of water  with each meal and aim for at least 8 glasses per day  Exercise at least 150 minutes every week.     Preventive Care 67 Years and Older, Male Preventive care refers to lifestyle choices and visits with your health care provider that can promote health and wellness. Preventive care visits are also called wellness exams. What can I expect for my preventive care visit? Counseling During your preventive care visit, your health care provider may ask about your: Medical history, including: Past medical problems. Family medical history. History of falls. Current health, including: Emotional well-being. Home life and relationship well-being. Sexual activity. Memory and ability to understand (cognition). Lifestyle, including: Alcohol, nicotine or tobacco, and drug use. Access to firearms. Diet, exercise, and sleep habits. Work and work Astronomer. Sunscreen use. Safety issues such as seatbelt and bike helmet use. Physical exam Your health care provider will check your: Height and weight. These may be used to calculate your BMI (body mass index). BMI is a measurement that tells if you are at a healthy weight. Waist circumference. This measures the distance around your waistline. This measurement also tells if you are at a healthy weight and may help predict your risk of certain diseases, such as type 2 diabetes and high blood pressure. Heart rate and blood pressure. Body temperature. Skin for abnormal spots. What immunizations do I need?  Vaccines are usually given at various ages, according to a  schedule. Your health care provider will recommend vaccines for you based on your age, medical history, and lifestyle or other factors, such as travel or where you work. What tests do I need? Screening Your health care provider may recommend screening tests for certain conditions. This may include: Lipid and cholesterol  levels. Diabetes screening. This is done by checking your blood sugar (glucose) after you have not eaten for a while (fasting). Hepatitis C test. Hepatitis B test. HIV (human immunodeficiency virus) test. STI (sexually transmitted infection) testing, if you are at risk. Lung cancer screening. Colorectal cancer screening. Prostate cancer screening. Abdominal aortic aneurysm (AAA) screening. You may need this if you are a current or former smoker. Talk with your health care provider about your test results, treatment options, and if necessary, the need for more tests. Follow these instructions at home: Eating and drinking  Eat a diet that includes fresh fruits and vegetables, whole grains, lean protein, and low-fat dairy products. Limit your intake of foods with high amounts of sugar, saturated fats, and salt. Take vitamin and mineral supplements as recommended by your health care provider. Do not drink alcohol if your health care provider tells you not to drink. If you drink alcohol: Limit how much you have to 0-2 drinks a day. Know how much alcohol is in your drink. In the [place] one drink equals one 12 oz bottle of beer (355 mL), one 5 oz glass of wine (148 mL), or one 1 oz glass of hard liquor (44 mL). Lifestyle Brush your teeth every morning and night with fluoride toothpaste. Floss one time each day. Exercise for at least 30 minutes 5 or more days each week. Do not use any products that contain nicotine or tobacco. These products include cigarettes, chewing tobacco, and vaping devices, such as e-cigarettes. If you need help quitting, ask your health care provider. Do not use drugs. If you are sexually active, practice safe sex. Use a condom or other form of protection to prevent STIs. Take aspirin  only as told by your health care provider. Make sure that you understand how much to take and what form to take. Work with your health care provider to find out whether it is safe and  beneficial for you to take aspirin  daily. Ask your health care provider if you need to take a cholesterol-lowering medicine (statin). Find healthy ways to manage stress, such as: Meditation, yoga, or listening to music. Journaling. Talking to a trusted person. Spending time with friends and family. Safety Always wear your seat belt while driving or riding in a vehicle. Do not drive: If you have been drinking alcohol. Do not ride with someone who has been drinking. When you are tired or distracted. While texting. If you have been using any mind-altering substances or drugs. Wear a helmet and other protective equipment during sports activities. If you have firearms in your house, make sure you follow all gun safety procedures. Minimize exposure to UV radiation to reduce your risk of skin cancer. What's next? Visit your health care provider once a year for an annual wellness visit. Ask your health care provider how often you should have your eyes and teeth checked. Stay up to date on all vaccines. This information is not intended to replace advice given to you by your health care provider. Make sure you discuss any questions you have with your health care provider. Document Revised: 07/03/2020 Document Reviewed: 07/03/2020 Elsevier Patient Education  2024 [employer].

## 2023-10-18 DIAGNOSIS — Z23 Encounter for immunization: Secondary | ICD-10-CM | POA: Diagnosis not present

## 2023-10-18 DIAGNOSIS — M25562 Pain in left knee: Secondary | ICD-10-CM | POA: Diagnosis not present

## 2023-10-18 DIAGNOSIS — M25561 Pain in right knee: Secondary | ICD-10-CM | POA: Diagnosis not present

## 2023-10-19 ENCOUNTER — Ambulatory Visit: Payer: Medicare Other

## 2023-10-19 VITALS — Ht 74.0 in | Wt 206.0 lb

## 2023-10-19 DIAGNOSIS — Z0000 Encounter for general adult medical examination without abnormal findings: Secondary | ICD-10-CM | POA: Diagnosis not present

## 2023-10-19 NOTE — Progress Notes (Addendum)
 Subjective:   Jonathan Holden is a 78 y.o. who presents for a Medicare Wellness preventive visit.  As a reminder, Annual Wellness Visits don't include a physical exam, and some assessments may be limited, especially if this visit is performed virtually. We may recommend an in-person follow-up visit with your provider if needed.  Visit Complete: Virtual I connected with  Reeves LELON Bernheim on 10/19/23 by a audio enabled telemedicine application and verified that I am speaking with the correct person using two identifiers.  Patient Location: Home  Provider Location: Office/Clinic  I discussed the limitations of evaluation and management by telemedicine. The patient expressed understanding and agreed to proceed.  Vital Signs: Because this visit was a virtual/telehealth visit, some criteria may be missing or patient reported. Any vitals not documented were not able to be obtained and vitals that have been documented are patient reported.    Persons Participating in Visit: Patient.  AWV Questionnaire: No: Patient Medicare AWV questionnaire was not completed prior to this visit.  Cardiac Risk Factors include: advanced age (>58men, >21 women);dyslipidemia;hypertension;male gender     Objective:    Today's Vitals   10/19/23 1002  Weight: 206 lb (93.4 kg)  Height: 6' 2 (1.88 m)   Body mass index is 26.45 kg/m.     10/19/2023   10:07 AM 11/02/2022    8:55 AM 11/02/2022    1:10 AM 10/15/2022    8:44 AM 09/21/2022   11:09 AM 09/19/2022    8:04 AM 02/10/2022    9:30 AM  Advanced Directives  Does Patient Have a Medical Advance Directive? Yes No No Yes  No Yes  Type of Estate agent of [place];Living will   Healthcare Power of [place];Living will   Healthcare Power of [place];Living will  Copy of Healthcare Power of Attorney in Chart? No - copy requested   No - copy requested   Yes - validated most recent copy scanned in chart (See row information)  Would patient  like information on creating a medical advance directive?  No - Patient declined   No - Patient declined      Current Medications (verified) Outpatient Encounter Medications as of 10/19/2023  Medication Sig   amiodarone  (PACERONE ) 200 MG tablet TAKE 1 TABLET (200 MG TOTAL) BY MOUTH DAILY. \   aspirin  EC 81 MG tablet Take 1 tablet (81 mg total) by mouth daily. Swallow whole.   atorvastatin  (LIPITOR) 40 MG tablet Take 1 tablet (40 mg total) by mouth daily.   Carboxymethylcellul-Glycerin  (LUBRICATING EYE DROPS OP) Place 1 drop into both eyes daily as needed (dry eyes).   clobetasol  ointment (TEMOVATE ) 0.05 % APPLY TO AFFECTED AREA TWICE A DAY   Evolocumab  (REPATHA  SURECLICK) 140 MG/ML SOAJ Inject 140 mg into the skin every 14 (fourteen) days.   ferrous sulfate  325 (65 FE) MG tablet TAKE 1 TABLET BY MOUTH EVERY DAY WITH BREAKFAST   isosorbide  mononitrate (IMDUR ) 30 MG 24 hr tablet TAKE 1 TABLET BY MOUTH EVERY DAY   ketoconazole  (NIZORAL ) 2 % shampoo Apply 1 Application topically 3 (three) times a week. APPLY 1 TO 3 TIMES A WEEK TO SCALP. LET SIT 10 MIN THEN RINSE OUT. STRENGTH: 2 %   lisinopril  (ZESTRIL ) 10 MG tablet TAKE 1 TABLET BY MOUTH DAILY.   metoprolol  succinate (TOPROL -XL) 25 MG 24 hr tablet TAKE 1 TABLET (25 MG TOTAL) BY MOUTH DAILY. TAKE WITH OR IMMEDIATELY FOLLOWING A MEAL. CAN TAKE AN ADDITIONAL TAB AS NEEDED   nitroGLYCERIN  (NITROSTAT )  0.4 MG SL tablet Place 1 tablet (0.4 mg total) under the tongue every 5 (five) minutes x 3 doses as needed for chest pain.   valACYclovir  (VALTREX ) 1000 MG tablet TAKE 2 TABLETS BY MOUTH AT THE ONSET OF A FLARE THEN TAKE 1 TWICE DAILY AS NEEDED FOR FEVER BLISTERS   clopidogrel  (PLAVIX ) 75 MG tablet Take 1 tablet (75 mg total) by mouth daily. (Patient not taking: Reported on 10/19/2023)   No facility-administered encounter medications on file as of 10/19/2023.    Allergies (verified) Eliquis  [apixaban ], Ciprofloxacin, Food, Glutamic acid, Grass  pollen(k-o-r-t-swt vern), and Pollen extract   History: Past Medical History:  Diagnosis Date   Acute lower GI bleeding 03/19/2016   Arthritis    Atrial fibrillation with RVR (HCC) 02/21/2016   Coronary artery disease    Diverticulosis 04/18/2016   Dysrhythmia    AFIB   Endocarditis of native valve    Former smoker    History of MI (myocardial infarction) 1992   Hyperlipidemia    Hypertension    Infection of intervertebral disc (HCC)    Insomnia 04/18/2016   Left retinal detachment 03/18/2015   Loose body of right shoulder    Myocardial infarction (HCC)    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   Pseudophakia of left eye 05/22/2013   Pseudophakia of right eye 06/05/2013   S/P coronary angioplasty    Skin cancer    Hands, arms, nose   Streptococcal bacteremia    Past Surgical History:  Procedure Laterality Date   CARDIOVASCULAR STRESS TEST  08/22/2008   CATARACT EXTRACTION W/ INTRAOCULAR LENS IMPLANT Bilateral    COLONOSCOPY WITH PROPOFOL  N/A 02/10/2022   Procedure: COLONOSCOPY WITH PROPOFOL ;  Surgeon: Dianna Specking, MD;  Location: [facility];  Service: Gastroenterology;  Laterality: N/A;   CORONARY ANGIOPLASTY  1992   ERCP W/ SPHICTEROTOMY  12/15/2002   LAPAROSCOPIC CHOLECYSTECTOMY  12/16/2002   LEFT HEART CATH AND CORONARY ANGIOGRAPHY N/A 09/22/2022   Procedure: LEFT HEART CATH AND CORONARY ANGIOGRAPHY;  Surgeon: Wonda Sharper, MD;  Location: [facility] INVASIVE CV LAB;  Service: Cardiovascular;  Laterality: N/A;   LUMBAR LAMINECTOMY     L5   POLYPECTOMY  02/10/2022   Procedure: POLYPECTOMY;  Surgeon: Dianna Specking, MD;  Location: [facility];  Service: Gastroenterology;;   REVERSE SHOULDER ARTHROPLASTY Right 01/09/2022   Procedure: REVERSE SHOULDER ARTHROPLASTY;  Surgeon: Sharl Selinda Dover, MD;  Location: [facility];  Service: Orthopedics;  Laterality: Right;  150   RIGHT SHOULDER SURGERY      SKIN CANCER EXCISION     TEE WITHOUT CARDIOVERSION N/A  02/28/2016   Procedure: TRANSESOPHAGEAL ECHOCARDIOGRAM (TEE);  Surgeon: Vina Okey GAILS, MD;  Location[facility] ENDOSCOPY;  Service: Cardiovascular;  Laterality: N/A;   TOTAL HIP ARTHROPLASTY Left 07/23/2021   Procedure: TOTAL HIP ARTHROPLASTY ANTERIOR APPROACH;  Surgeon: Melodi Lerner, MD;  Location: [facility];  Service: Orthopedics;  Laterality: Left;   Family History  Problem Relation Age of Onset   Other Mother        AGE 55 HEALTHY   Heart attack Father 40       2 MI   Heart disease Maternal Grandfather    Heart disease Paternal Grandfather    Emphysema Other    Other Brother    Social History   Socioeconomic History   Marital status: Divorced    Spouse name: Not on file   Number of children: Not on file   Years of education: Not on file   Highest education  level: Not on file  Occupational History   Occupation: sales  Tobacco Use   Smoking status: Former    Current packs/day: 0.00    Average packs/day: 2.0 packs/day for 20.0 years (40.0 ttl pk-yrs)    Types: Cigarettes    Start date: 03/27/1970    Quit date: 03/27/1990    Years since quitting: 33.5   Smokeless tobacco: Never  Vaping Use   Vaping status: Never Used  Substance and Sexual Activity   Alcohol use: Yes    Comment: Social   Drug use: No   Sexual activity: Not Currently  Other Topics Concern   Not on file  Social History Narrative   Not on file   Social Drivers of Health   Financial Resource Strain: Low Risk  (10/19/2023)   Overall Financial Resource Strain (CARDIA)    Difficulty of Paying Living Expenses: Not hard at all  Food Insecurity: No Food Insecurity (10/19/2023)   Hunger Vital Sign    Worried About Running Out of Food in the Last Year: Never true    Ran Out of Food in the Last Year: Never true  Transportation Needs: No Transportation Needs (10/19/2023)   PRAPARE - Administrator, Civil Service (Medical): No    Lack of Transportation (Non-Medical): No  Physical Activity: Sufficiently Active  (10/19/2023)   Exercise Vital Sign    Days of Exercise per Week: 3 days    Minutes of Exercise per Session: 50 min  Stress: No Stress Concern Present (10/19/2023)   [employer] of Occupational Health - Occupational Stress Questionnaire    Feeling of Stress: Not at all  Social Connections: Socially Isolated (10/19/2023)   Social Connection and Isolation Panel    Frequency of Communication with Friends and Family: Twice a week    Frequency of Social Gatherings with Friends and Family: More than three times a week    Attends Religious Services: Never    Database administrator or Organizations: No    Attends Engineer, structural: Never    Marital Status: Divorced    Tobacco Counseling Counseling given: Not Answered    Clinical Intake:  Pre-visit preparation completed: Yes  Pain : No/denies pain     BMI - recorded: 26.45 Nutritional Status: BMI 25 -29 Overweight Nutritional Risks: None Diabetes: No  No results found for: HGBA1C   How often do you need to have someone help you when you read instructions, pamphlets, or other written materials from your doctor or pharmacy?: 1 - Never  Interpreter Needed?: No  Information entered by :: Ellouise Haws, LPN   Activities of Daily Living     10/19/2023   10:04 AM 11/02/2022   10:45 AM  In your present state of health, do you have any difficulty performing the following activities:  Hearing? 0 0  Vision? 0 0  Difficulty concentrating or making decisions? 0 0  Walking or climbing stairs? 1   Comment mobility at times   Dressing or bathing? 0   Doing errands, shopping? 0 0  Preparing Food and eating ? N   Using the Toilet? N   In the past six months, have you accidently leaked urine? N   Do you have problems with loss of bowel control? N   Managing your Medications? N   Managing your Finances? N   Housekeeping or managing your Housekeeping? N     Patient Care Team: Kennyth Worth HERO, MD as PCP - General  (Family Medicine) Inocencio,  Soyla Lunger, MD as PCP - Electrophysiology (Cardiology) Nahser, Aleene PARAS, MD (Inactive) as PCP - Cardiology (Cardiology) System, Provider Not In (Oral Surgery) Gillie Duncans, MD as Consulting Physician (Neurosurgery) System, Provider Not In (Dentistry) Luiz Channel, MD as Consulting Physician (Infectious Diseases) Heide Ingle, MD as Consulting Physician (Orthopedic Surgery) Eustace Arlean Kay, MD as Referring Physician (Dermatology) Nicholaus Sherlean CROME, [facility] (Inactive) as Pharmacist (Pharmacist)  I have updated your Care Teams any recent Medical Services you may have received from other providers in the past year.     Assessment:   This is a routine wellness examination for [place].  Hearing/Vision screen Hearing Screening - Comments:: Pt denies any hearing issues  Vision Screening - Comments:: Wears rx glasses - up to date with routine eye exams with [facility]   Goals Addressed             This Visit's Progress    Patient Stated       Working on balance and walking        Depression Screen     10/19/2023   10:08 AM 10/15/2023   10:08 AM 06/11/2023   11:57 AM 06/11/2023   11:55 AM 10/15/2022    8:45 AM 10/02/2022    8:36 AM 03/31/2022   10:54 AM  PHQ 2/9 Scores  PHQ - 2 Score 0 0 0 0 0 0 0    Fall Risk     10/19/2023   10:10 AM 10/15/2023   10:08 AM 06/11/2023   11:57 AM 06/11/2023   11:55 AM 10/14/2022   11:48 AM  Fall Risk   Falls in the past year? 1  0 0 0  Number falls in past yr: 1 0 0 0   Injury with Fall? 1 0 0 0   Comment scrapes      Risk for fall due to : Impaired balance/gait;Impaired mobility No Fall Risks No Fall Risks No Fall Risks Impaired vision  Risk for fall due to: Comment had an episode of Afib      Follow up Falls prevention discussed    Falls prevention discussed    MEDICARE RISK AT HOME:  Medicare Risk at Home Any stairs in or around the home?: Yes If so, are there any without handrails?: Yes Home free of  loose throw rugs in walkways, pet beds, electrical cords, etc?: Yes Adequate lighting in your home to reduce risk of falls?: Yes Life alert?: No Use of a cane, walker or w/c?: No Grab bars in the bathroom?: No Shower chair or bench in shower?: Yes Elevated toilet seat or a handicapped toilet?: No  TIMED UP AND GO:  Was the test performed?  No  Cognitive Function: 6CIT completed        10/19/2023   10:11 AM 10/15/2022    8:50 AM 09/27/2020    8:17 AM 09/21/2019    8:18 AM  6CIT Screen  What Year? 0 points 0 points 0 points 0 points  What month? 0 points 0 points 0 points 0 points  What time? 0 points 0 points 0 points   Count back from 20 0 points 0 points 0 points 0 points  Months in reverse 0 points 0 points 0 points 0 points  Repeat phrase 0 points 2 points 0 points 0 points  Total Score 0 points 2 points 0 points     Immunizations Immunization History  Administered Date(s) Administered   Fluad Quad(high Dose 65+) 10/26/2018, 10/10/2021   Fluzone Influenza virus vaccine,trivalent (  IIV3), split virus 11/26/2008   INFLUENZA, HIGH DOSE SEASONAL PF 12/31/2016, 12/10/2017, 10/15/2023   Influenza-Unspecified 11/20/2015, 10/14/2022   Moderna Sars-Covid-2 Vaccination 11/11/2019, 07/06/2020   PFIZER(Purple Top)SARS-COV-2 Vaccination 02/27/2019, 03/24/2019   PNEUMOCOCCAL CONJUGATE-20 10/15/2023   Pfizer Covid-19 Vaccine Bivalent Booster 69yrs & up 12/05/2020   Pfizer(Comirnaty)Fall Seasonal Vaccine 12 years and older 10/14/2022, 10/18/2023   Tdap 11/02/2022   Zoster Recombinant(Shingrix) 08/30/2022, 11/18/2022    Screening Tests Health Maintenance  Topic Date Due   COVID-19 Vaccine (8 - 2025-26 season) 12/13/2023   Medicare Annual Wellness (AWV)  10/18/2024   DTaP/Tdap/Td (2 - Td or Tdap) 11/01/2032   Pneumococcal Vaccine: 50+ Years  Completed   Influenza Vaccine  Completed   Hepatitis C Screening  Completed   Zoster Vaccines- Shingrix  Completed   HPV VACCINES  Aged Out    Meningococcal B Vaccine  Aged Out   Colonoscopy  Discontinued    Health Maintenance Items Addressed: See Nurse Notes at the end of this note  Additional Screening:  Vision Screening: Recommended annual ophthalmology exams for early detection of glaucoma and other disorders of the eye. Is the patient up to date with their annual eye exam?  Yes  Who is the provider or what is the name of the office in which the patient attends annual eye exams? [facility]   Dental Screening: Recommended annual dental exams for proper oral hygiene  Community Resource Referral / Chronic Care Management: CRR required this visit?  No   CCM required this visit?  No   Plan:    I have personally reviewed and noted the following in the patient's chart:   Medical and social history Use of alcohol, tobacco or illicit drugs  Current medications and supplements including opioid prescriptions. Patient is not currently taking opioid prescriptions. Functional ability and status Nutritional status Physical activity Advanced directives List of other physicians Hospitalizations, surgeries, and ER visits in previous 12 months Vitals Screenings to include cognitive, depression, and falls Referrals and appointments  In addition, I have reviewed and discussed with patient certain preventive protocols, quality metrics, and best practice recommendations. A written personalized care plan for preventive services as well as general preventive health recommendations were provided to patient.   Ellouise VEAR Haws, LPN   [id number]   After Visit Summary: (MyChart) Due to this being a telephonic visit, the after visit summary with patients personalized plan was offered to patient via MyChart   Notes: Nothing significant to report at this time.

## 2023-10-19 NOTE — Patient Instructions (Signed)
 Jonathan Holden,  Thank you for taking the time for your Medicare Wellness Visit. I appreciate your continued commitment to your health goals. Please review the care plan we discussed, and feel free to reach out if I can assist you further.  Medicare recommends these wellness visits once per year to help you and your care team stay ahead of potential health issues. These visits are designed to focus on prevention, allowing your provider to concentrate on managing your acute and chronic conditions during your regular appointments.  Please note that Annual Wellness Visits do not include a physical exam. Some assessments may be limited, especially if the visit was conducted virtually. If needed, we may recommend a separate in-person follow-up with your provider.  Ongoing Care Seeing your primary care provider every 3 to 6 months helps us  monitor your health and provide consistent, personalized care.   Referrals If a referral was made during today's visit and you haven't received any updates within two weeks, please contact the referred provider directly to check on the status.  Recommended Screenings:  Health Maintenance  Topic Date Due   Medicare Annual Wellness Visit  10/15/2023   COVID-19 Vaccine (7 - Mixed Product risk 2024-25 season) 10/31/2023*   DTaP/Tdap/Td vaccine (2 - Td or Tdap) 11/01/2032   Pneumococcal Vaccine for age over 67  Completed   Flu Shot  Completed   Hepatitis C Screening  Completed   Zoster (Shingles) Vaccine  Completed   HPV Vaccine  Aged Out   Meningitis B Vaccine  Aged Out   Colon Cancer Screening  Discontinued  *Topic was postponed. The date shown is not the original due date.       11/02/2022    8:55 AM  Advanced Directives  Does Patient Have a Medical Advance Directive? No  Would patient like information on creating a medical advance directive? No - Patient declined   Advance Care Planning is important because it: Ensures you receive medical care that  aligns with your values, goals, and preferences. Provides guidance to your family and loved ones, reducing the emotional burden of decision-making during critical moments.  Vision: Annual vision screenings are recommended for early detection of glaucoma, cataracts, and diabetic retinopathy. These exams can also reveal signs of chronic conditions such as diabetes and high blood pressure.  Dental: Annual dental screenings help detect early signs of oral cancer, gum disease, and other conditions linked to overall health, including heart disease and diabetes.  Please see the attached documents for additional preventive care recommendations.

## 2023-10-20 DIAGNOSIS — M25562 Pain in left knee: Secondary | ICD-10-CM | POA: Diagnosis not present

## 2023-10-20 DIAGNOSIS — M25561 Pain in right knee: Secondary | ICD-10-CM | POA: Diagnosis not present

## 2023-10-21 DIAGNOSIS — M25561 Pain in right knee: Secondary | ICD-10-CM | POA: Diagnosis not present

## 2023-10-21 DIAGNOSIS — M25562 Pain in left knee: Secondary | ICD-10-CM | POA: Diagnosis not present

## 2023-10-25 DIAGNOSIS — M25561 Pain in right knee: Secondary | ICD-10-CM | POA: Diagnosis not present

## 2023-10-25 DIAGNOSIS — M25562 Pain in left knee: Secondary | ICD-10-CM | POA: Diagnosis not present

## 2023-10-27 DIAGNOSIS — M25561 Pain in right knee: Secondary | ICD-10-CM | POA: Diagnosis not present

## 2023-10-27 DIAGNOSIS — M25562 Pain in left knee: Secondary | ICD-10-CM | POA: Diagnosis not present

## 2023-10-28 ENCOUNTER — Telehealth: Payer: Self-pay | Admitting: *Deleted

## 2023-10-28 DIAGNOSIS — I159 Secondary hypertension, unspecified: Secondary | ICD-10-CM

## 2023-10-28 NOTE — Telephone Encounter (Signed)
 Copied from CRM [id number]. Topic: Referral - Question >> Oct 27, 2023 11:29 AM Berneda FALCON wrote: Reason for CRM: Patient states that his cardiologist retired and he needs a new referral for a cardiologist for his low blood pressure-he saw Dr. Kennyth on 9/26 for this   New referral placed  [facility]

## 2023-10-29 ENCOUNTER — Encounter: Payer: Self-pay | Admitting: Family Medicine

## 2023-10-29 ENCOUNTER — Ambulatory Visit (INDEPENDENT_AMBULATORY_CARE_PROVIDER_SITE_OTHER): Admitting: Family Medicine

## 2023-10-29 VITALS — BP 102/60 | HR 74 | Temp 98.2°F | Ht 74.0 in | Wt 209.4 lb

## 2023-10-29 DIAGNOSIS — M25561 Pain in right knee: Secondary | ICD-10-CM | POA: Diagnosis not present

## 2023-10-29 DIAGNOSIS — I952 Hypotension due to drugs: Secondary | ICD-10-CM | POA: Diagnosis not present

## 2023-10-29 DIAGNOSIS — I25118 Atherosclerotic heart disease of native coronary artery with other forms of angina pectoris: Secondary | ICD-10-CM

## 2023-10-29 DIAGNOSIS — M25562 Pain in left knee: Secondary | ICD-10-CM | POA: Diagnosis not present

## 2023-10-29 MED ORDER — LISINOPRIL 5 MG PO TABS: 5.0000 mg | ORAL_TABLET | Freq: Every day | ORAL | 3 refills | Status: AC

## 2023-10-29 NOTE — Patient Instructions (Signed)
 Please follow up if symptoms do not improve or as needed.     VISIT SUMMARY: Today, we discussed your recent blood pressure fluctuations and episodes of lightheadedness. We also reviewed your current medications and noted your recent unintentional weight loss. Your history of hypertension, atrial fibrillation, and heart disease was considered in our plan.  YOUR PLAN: -HYPERTENSION: Hypertension, or high blood pressure, has been fluctuating recently, likely due to your weight loss and current medication regimen. We will decrease your lisinopril  dose from 10 mg to 5 mg and monitor your blood pressure regularly at rehab and at home. If your blood pressure remains low, we may consider discontinuing lisinopril . [facility] prescription [facility] 5 mg [facility] [place].   INSTRUCTIONS: Please monitor your blood pressure regularly at home and during rehab sessions. If you notice consistently low readings, contact us  as we may need to adjust your medication further. Also, make sure to attend your annual cardiology visit later this month for a comprehensive evaluation.                      Contains text generated by Abridge.                                 Contains text generated by Abridge.

## 2023-10-29 NOTE — Progress Notes (Signed)
 Subjective  CC:  Chief Complaint  Patient presents with   Hypertension    Pt c/o has been having episodes where he is falling asleep at the wheel. He was at [facility] the other day 103/58. Have been feeling lightheaded. Thinks he is being overmedicated.   Same day acute visit; PCP not available. New pt to me. Chart reviewed.   HPI: Jonathan Holden is a 78 y.o. male who presents to the office today to address the problems listed above in the chief complaint. Discussed the use of AI scribe software for clinical note transcription with the patient, who gave verbal consent to proceed.  History of Present Illness Jonathan Holden is a 78 year old male with hypertension and atrial fibrillation who presents with blood pressure fluctuations and lightheadedness.  Blood pressure fluctuations and lightheadedness - Experiencing fluctuations in blood pressure with episodes of lightheadedness - On Wednesday, blood pressure measured at 103/98 mmHg with associated lightheadedness - During a recent physical therapy session, blood pressure measured at 142 mmHg at the end of the session - No chest pain - Episodes of lightheadedness present - had hip replacement and reports weight loss, no other med changes or heart related sxs - no palpitations  Antihypertensive medication use - Currently taking metoprolol  and lisinopril  - Did not take medications the night before the appointment due to previously low blood pressure readings and to assess current status during the visit - Medication schedule varies, sometimes taken at night and other times in the morning  Unintentional weight loss - Lost approximately fifteen pounds over the last 1.5 to 2 months  Cardiac history - History of hypertension and atrial fibrillation - History of heart disease, including myocardial infarction in the 1990s - Long-term cardiology care - Previous cardiologist, Dr. Esmeralda Sharps, recently retired   Assessment  1.  Hypotension due to drugs   2. Coronary artery disease of native artery of native heart with stable angina pectoris      Plan  Assessment and Plan Assessment & Plan Hypertension Hypertension with recent hypotensive episodes, likely due to weight loss and current medication regimen. Blood pressure was 102/60 today, with a reading of 142 after physical therapy. Lightheadedness reported, possibly related to hypotension. Current medications include metoprolol  and lisinopril . Metoprolol  dose was increased in November, which may contribute to lower blood pressure. Weight loss of about 15 pounds in the last 1.5 to 2 months may have reduced the need for current antihypertensive medication levels. - Decrease lisinopril  dose from 10 mg to 5 mg. - Monitor blood pressure regularly at rehab and at home. - If blood pressure remains low, consider discontinuing lisinopril . - [facility] [place]  Cad and afib: stable on current meds. Amiodarone , bb, imdur  - Attend annual cardiology visit later this month for further evaluation and management.  Follow up: prn and with cardiology as scheduled in a few weeks No orders of the defined types were placed in this encounter.  No orders of the defined types were placed in this encounter.    I reviewed the patients updated PMH, FH, and SocHx.  Patient Active Problem List   Diagnosis Date Noted   Lower extremity edema 03/31/2022   Acute deep vein thrombosis (DVT) of femoral vein of both lower extremities (HCC) 12/23/2021   History of adenomatous polyp of colon 09/10/2021   Osteoarthritis of left hip s/p replacement 07/2021 07/23/2021   Unsteady gait 07/25/2020   CHF (congestive heart failure) (  HCC) 07/25/2019   Cold sore 07/25/2019   Diverticulosis 04/18/2016   Insomnia 04/18/2016   H/O gastric ulcer    Former smoker    Syncope 03/10/2016   Gastroesophageal reflux disease    Paroxysmal A-fib (HCC) 02/21/2016   CAD  (coronary artery disease) 09/19/2014   HTN (hypertension) 09/19/2014   HLD (hyperlipidemia) 10/03/2013   Current Meds  Medication Sig   amiodarone  (PACERONE ) 200 MG tablet TAKE 1 TABLET (200 MG TOTAL) BY MOUTH DAILY. \   aspirin  EC 81 MG tablet Take 1 tablet (81 mg total) by mouth daily. Swallow whole.   atorvastatin  (LIPITOR) 40 MG tablet Take 1 tablet (40 mg total) by mouth daily.   Carboxymethylcellul-Glycerin  (LUBRICATING EYE DROPS OP) Place 1 drop into both eyes daily as needed (dry eyes).   clobetasol  ointment (TEMOVATE ) 0.05 % APPLY TO AFFECTED AREA TWICE A DAY   Evolocumab  (REPATHA  SURECLICK) 140 MG/ML SOAJ Inject 140 mg into the skin every 14 (fourteen) days.   ferrous sulfate  325 (65 FE) MG tablet TAKE 1 TABLET BY MOUTH EVERY DAY WITH BREAKFAST   isosorbide  mononitrate (IMDUR ) 30 MG 24 hr tablet TAKE 1 TABLET BY MOUTH EVERY DAY   ketoconazole  (NIZORAL ) 2 % shampoo Apply 1 Application topically 3 (three) times a week. APPLY 1 TO 3 TIMES A WEEK TO SCALP. LET SIT 10 MIN THEN RINSE OUT. STRENGTH: 2 %   lisinopril  (ZESTRIL ) 10 MG tablet TAKE 1 TABLET BY MOUTH DAILY.   metoprolol  succinate (TOPROL -XL) 25 MG 24 hr tablet TAKE 1 TABLET (25 MG TOTAL) BY MOUTH DAILY. TAKE WITH OR IMMEDIATELY FOLLOWING A MEAL. CAN TAKE AN ADDITIONAL TAB AS NEEDED   nitroGLYCERIN  (NITROSTAT ) 0.4 MG SL tablet Place 1 tablet (0.4 mg total) under the tongue every 5 (five) minutes x 3 doses as needed for chest pain.   valACYclovir  (VALTREX ) 1000 MG tablet TAKE 2 TABLETS BY MOUTH AT THE ONSET OF A FLARE THEN TAKE 1 TWICE DAILY AS NEEDED FOR FEVER BLISTERS   Allergies: Patient is allergic to eliquis  [apixaban ], ciprofloxacin, food, glutamic acid, grass pollen(k-o-r-t-swt vern), and pollen extract. Family History: Patient family history includes Emphysema in an other family member; Heart attack (age of onset: 48) in his father; Heart disease in his maternal grandfather and paternal grandfather; Other in his brother and  mother. Social History:  Patient  reports that he quit smoking about 33 years ago. His smoking use included cigarettes. He started smoking about 53 years ago. He has a 40 pack-year smoking history. He has never used smokeless tobacco. He reports current alcohol use. He reports that he does not use drugs.  Review of Systems: Constitutional: Negative for fever malaise or anorexia Cardiovascular: negative for chest pain Respiratory: negative for SOB or persistent cough Gastrointestinal: negative for abdominal pain  Objective  Vitals: BP 102/60 (BP Location: Left Arm, Patient Position: Sitting, Cuff Size: Large)   Pulse 74   Temp 98.2 F (36.8 C) (Temporal)   Ht 6' 2 (1.88 m)   Wt 209 lb 6.1 oz (95 kg)   SpO2 96%   BMI 26.88 kg/m  General: no acute distress , A&Ox3 HEENT: PEERL, conjunctiva normal, neck is supple Cardiovascular:  RRR without murmur or gallop.  Respiratory:  Good breath sounds bilaterally, CTAB with normal respiratory effort  Commons side effects, risks, benefits, and alternatives for medications and treatment plan prescribed today were discussed, and the patient expressed understanding of the given instructions. Patient is instructed to call or message via MyChart if he/she  has any questions or concerns regarding our treatment plan. No barriers to understanding were identified. We discussed Red Flag symptoms and signs in detail. Patient expressed understanding regarding what to do in case of urgent or emergency type symptoms.  Medication list was reconciled, printed and provided to the patient in AVS. Patient instructions and summary information was reviewed with the patient as documented in the AVS. This note was prepared with assistance of Dragon voice recognition software. Occasional wrong-word or sound-a-like substitutions may have occurred due to the inherent limitations of voice recognition software

## 2023-11-01 DIAGNOSIS — R29898 Other symptoms and signs involving the musculoskeletal system: Secondary | ICD-10-CM | POA: Diagnosis not present

## 2023-11-01 DIAGNOSIS — M25561 Pain in right knee: Secondary | ICD-10-CM | POA: Diagnosis not present

## 2023-11-09 ENCOUNTER — Other Ambulatory Visit: Payer: Self-pay | Admitting: Nurse Practitioner

## 2023-11-09 DIAGNOSIS — I214 Non-ST elevation (NSTEMI) myocardial infarction: Secondary | ICD-10-CM

## 2023-11-09 DIAGNOSIS — I2510 Atherosclerotic heart disease of native coronary artery without angina pectoris: Secondary | ICD-10-CM

## 2023-11-09 DIAGNOSIS — E785 Hyperlipidemia, unspecified: Secondary | ICD-10-CM

## 2023-11-09 MED ORDER — ISOSORBIDE MONONITRATE ER 30 MG PO TB24: 30.0000 mg | ORAL_TABLET | Freq: Every day | ORAL | 0 refills | Status: DC

## 2023-11-09 MED ORDER — ATORVASTATIN CALCIUM 40 MG PO TABS: 40.0000 mg | ORAL_TABLET | Freq: Every day | ORAL | 0 refills | Status: DC

## 2023-11-09 MED ORDER — REPATHA SURECLICK 140 MG/ML ~~LOC~~ SOAJ: 1.0000 | SUBCUTANEOUS | 1 refills | Status: DC

## 2023-11-09 NOTE — Telephone Encounter (Signed)
*  STAT* If patient is at the pharmacy, call can be transferred to refill team.   1. Which medications need to be refilled? (please list name of each medication and dose if known)   isosorbide  mononitrate (IMDUR ) 30 MG 24 hr tablet   atorvastatin  (LIPITOR) 40 MG tablet      Evolocumab  (REPATHA  SURECLICK) 140 MG/ML SOAJ   2. Would you like to learn more about the convenience, safety, & potential cost savings by using the [facility]? no   3. Are you open to using the [facility] (Type [facility]. no ).   4. Which pharmacy/location (including street and city if local pharmacy) is medication to be sent to? [id number] [place], [place] - [address] Phone: [phone]  Fax: [phone]       5. Do they need a 30 day or 90 day supply? 90  Pt said pharmacy told him they sent it over and he has been waiting on it

## 2023-11-09 NOTE — Telephone Encounter (Signed)
 RX sent in

## 2023-11-10 ENCOUNTER — Telehealth: Payer: Self-pay | Admitting: Pharmacy Technician

## 2023-11-10 DIAGNOSIS — E7849 Other hyperlipidemia: Secondary | ICD-10-CM

## 2023-11-10 NOTE — Telephone Encounter (Signed)
 Former patient of Dr. Alveta Scheduled to see Orren PA next week  Routed to PharmD team

## 2023-11-10 NOTE — Telephone Encounter (Signed)
 Hi, can the patient get an updated lipid panel? The last labs I see are from 09/20/22-ldl-137 and no labs found since to prove a positive response. Thank you!   Pharmacy Patient Advocate Encounter   Received notification from CoverMyMeds that prior authorization for repatha  is required/requested.   Insurance verification completed.   The patient is insured through [place].   Per test claim: PA required; PA started via CoverMyMeds. KEY [id number] . Please see clinical question(s) below that I am not finding the answer to in their chart and advise.

## 2023-11-17 DIAGNOSIS — Z96611 Presence of right artificial shoulder joint: Secondary | ICD-10-CM | POA: Diagnosis not present

## 2023-11-17 DIAGNOSIS — M25512 Pain in left shoulder: Secondary | ICD-10-CM | POA: Diagnosis not present

## 2023-11-18 NOTE — Progress Notes (Unsigned)
 Cardiology Office Note   Date:  11/19/2023  ID:  Curt, Oatis 1945-05-15, MRN [id number] PCP: Kennyth Worth HERO, MD   HeartCare Providers Cardiologist:  Aleene Passe, MD (Inactive) Electrophysiologist:  Will Gladis Norton, MD   History of Present Illness Jonathan Holden is a 78 y.o. male with a past medical history of CAD status post inferior MI treated with PCI to RCA in 1992, NSTEMI 08/2022 showing CTO of RCA with left-to-right collaterals, total occlusion of ramus intermedius, severe stenosis of left circumflex from ostium into the OM and mild nonobstructive disease in the left main and LAD, HTN, HLD, PAF, chronic bilateral DVTs, GI bleeds with diverticulosis not on anticoagulation presents for annual follow-up visit. Patient was seen 09/30/2022 for follow-up of recent NSTEMI during that visit patient reported doing well but still endorsed upper chest discomfort similar to his most recent anginal complaint.  Started on Imdur  30 mg with additional 30 mg as needed.  He was also noted to have controlled rate with amiodarone  and was not on his relation due to history of GI bleed.  He was admitted to the ED on 11/02/2022 after suffering mechanical fall from a brief syncopal episode.  Patient's labs were hemoglobin was 11.3 with positive FOBT and brown blood-tinged stool on rectal exam.  CT of the head was negative for acute intracranial.  He was on aspirin  and Plavix  held troponin were negative x 2.  Had serial hemoglobins obtained and overall stable.  GI was consulted and advised it was okay to resume DAPT.  Felt his syncopal episode was related to acute blood loss.  He was seen a year ago at his last office visit he reported feeling much better. Experienced any additional syncope or presyncope episodes.  He did have some occasional palpitations but denied any frequent episodes of tachycardia.  Denied any chest pain with the addition of isosorbide .  He reported that he also fractured  one of his lumbar vertebrae and discussed with PCP.  Blood pressure was well-controlled at that visit and compliant with medications.  Denied bleeding in his stool or urine since hospital visit.  No chest pain, palpitations, dyspnea, PND, orthopnea, nausea or vomiting, dizziness syncope, edema, weight gain or anxiety.  Today, he presents with a hx of atrial fibrillation with fatigue and concerns about medication side effects.  He experiences significant fatigue, particularly in the afternoons, severe enough to cause concern while driving. A 20-30 minute nap can alleviate the fatigue. He suspects overmedication as a possible cause. He is on amiodarone  and metoprolol  at low doses, with a heart rate of 63 beats per minute without medication.  He recalls an episode of lightheadedness and semi-consciousness attributed to dehydration, without hospital admission. He exercises regularly, attending the gym three times a week, without shortness of breath or chest pain during exercise.  His medication regimen includes Repatha , which he cannot refill due to insurance issues, aspirin  every third day due to easy bruising, and discontinued Plavix  for similar reasons. He takes lisinopril  and Lipitor for blood pressure and cholesterol management.  Reports no shortness of breath nor dyspnea on exertion. Reports no chest pain, pressure, or tightness. No edema, orthopnea, PND. Reports no palpitations.   Discussed the use of AI scribe software for clinical note transcription with the patient, who gave verbal consent to proceed.   ROS: Pertinent ROS in HPI  Studies Reviewed EKG Interpretation Date/Time:  Friday November 19 2023 09:58:37 EDT Ventricular Rate:  63 PR Interval:  198 QRS  Duration:  102 QT Interval:  424 QTC Calculation: 433 R Axis:   -13  Text Interpretation: Normal sinus rhythm Normal ECG When compared with ECG of 02-Nov-2022 01:10, PREVIOUS ECG IS PRESENT Confirmed by Lucien Blanc [phone]) on  11/19/2023 10:13:46 AM   Cardiac catheterization 09/22/2022 Left Main  There is mild diffuse disease throughout the vessel. Patent vessel with mild nonobstructive disease    Ramus Intermedius  Ramus lesion is 100% stenosed.    Left Circumflex  Ost Cx to Prox Cx lesion is 90% stenosed. The lesion is calcified. Complex calcified stenosis throughout the ostial and proximal circumflex    First Obtuse Marginal Branch  1st Mrg lesion is 80% stenosed. Focal stenosis at the origin of the first OM    Right Coronary Artery  Prox RCA to Mid RCA lesion is 100% stenosed. The lesion is severely calcified.    Right Posterior Descending Artery  Collaterals  RPDA filled by collaterals from 2nd Sept.    Collaterals  RPDA filled by collaterals from Dist Cx.      Third Right Posterolateral Branch  Collaterals  3rd RPL filled by collaterals from 1st Sept.      Intervention   No interventions have been documented.   Coronary Diagrams  Diagnostic Dominance: Right  Intervention Risk Assessment/Calculations  CHA2DS2-VASc Score = 4  This indicates a 4.8% annual risk of stroke. The patient's score is based upon: CHF History: 0 HTN History: 1 Diabetes History: 0 Stroke History: 0 Vascular Disease History: 1 Age Score: 2 Gender Score: 0       Physical Exam VS:  BP (!) 144/76   Pulse 63   Ht 6' 2 (1.88 m)   Wt 212 lb (96.2 kg)   SpO2 99%   BMI 27.22 kg/m        Wt Readings from Last 3 Encounters:  11/19/23 212 lb (96.2 kg)  10/29/23 209 lb 6.1 oz (95 kg)  10/19/23 206 lb (93.4 kg)    GEN: Well nourished, well developed in no acute distress NECK: No JVD; No carotid bruits CARDIAC: RRR, no murmurs, rubs, gallops RESPIRATORY:  Clear to auscultation without rales, wheezing or rhonchi  ABDOMEN: Soft, non-tender, non-distended EXTREMITIES:  No edema; No deformity   ASSESSMENT AND PLAN  Paroxysmal atrial fibrillation with medication-induced bradycardia and fatigue Daily AFib  symptoms with fatigue likely due to high metoprolol  dosage. Amiodarone  use requires monitoring for liver and lung side effects. - Decrease metoprolol  to 12.5 mg daily. - Order thyroid  panel, liver function tests, and chest x-ray for amiodarone  monitoring. - Instruct to track heart rate and blood pressure for two weeks. - Hold isosorbide  mononitrate and monitor for chest pain or hypertension.He tells me today that is wasn't sure why he was on this medication and that he has never had chest pain.   Atherosclerotic heart disease of native coronary artery Coronary artery disease with history of myocardial infarction. No current symptoms with exercise. - Continue exercise regimen and monitor for new symptoms. - Hold isosorbide  mononitrate and monitor for chest pain.  Essential hypertension Slightly elevated blood pressure. Adjusting metoprolol  dosage requires close monitoring. - Monitor blood pressure with adjusted metoprolol  dosage. - Continue lisinopril  5 mg daily.  Hyperlipidemia on PCSK9 inhibitor therapy On Repatha  but facing refill issues. Previous high LDL, recent weight loss may have improved levels. - Expedite Repatha  refill and resolve insurance issues. - Order lipid panel to assess cholesterol levels.  Chronic venous insufficiency with lower extremity swelling Swelling managed with compression  socks and wedge. Less frequent swelling reported. - Continue compression socks and sleeping with a wedge.  Syncope and collapse -lightheaded and though was dehydrated and no medical emergency -has not happened again     Dispo: He can follow-up in a year with Dr. Floretta  Signed, Orren LOISE Fabry, PA-C

## 2023-11-19 ENCOUNTER — Encounter: Payer: Self-pay | Admitting: Physician Assistant

## 2023-11-19 ENCOUNTER — Ambulatory Visit: Admitting: Physician Assistant

## 2023-11-19 ENCOUNTER — Ambulatory Visit (HOSPITAL_COMMUNITY)
Admission: RE | Admit: 2023-11-19 | Discharge: 2023-11-19 | Disposition: A | Discharge status: Home / Self Care | Source: Ambulatory Visit | Attending: Physician Assistant | Admitting: Physician Assistant

## 2023-11-19 VITALS — BP 144/76 | HR 63 | Ht 74.0 in | Wt 212.0 lb

## 2023-11-19 DIAGNOSIS — I214 Non-ST elevation (NSTEMI) myocardial infarction: Secondary | ICD-10-CM

## 2023-11-19 DIAGNOSIS — J984 Other disorders of lung: Secondary | ICD-10-CM | POA: Diagnosis not present

## 2023-11-19 DIAGNOSIS — I10 Essential (primary) hypertension: Secondary | ICD-10-CM

## 2023-11-19 DIAGNOSIS — T462X5A Adverse effect of other antidysrhythmic drugs, initial encounter: Secondary | ICD-10-CM | POA: Insufficient documentation

## 2023-11-19 DIAGNOSIS — I480 Paroxysmal atrial fibrillation: Secondary | ICD-10-CM

## 2023-11-19 DIAGNOSIS — E785 Hyperlipidemia, unspecified: Secondary | ICD-10-CM | POA: Diagnosis not present

## 2023-11-19 DIAGNOSIS — I2510 Atherosclerotic heart disease of native coronary artery without angina pectoris: Secondary | ICD-10-CM

## 2023-11-19 DIAGNOSIS — T462X5D Adverse effect of other antidysrhythmic drugs, subsequent encounter: Secondary | ICD-10-CM | POA: Diagnosis not present

## 2023-11-19 DIAGNOSIS — Z79899 Other long term (current) drug therapy: Secondary | ICD-10-CM

## 2023-11-19 DIAGNOSIS — R55 Syncope and collapse: Secondary | ICD-10-CM | POA: Diagnosis not present

## 2023-11-19 LAB — LIPID PANEL

## 2023-11-19 MED ORDER — REPATHA SURECLICK 140 MG/ML ~~LOC~~ SOAJ: 1.0000 | SUBCUTANEOUS | 3 refills | Status: AC

## 2023-11-19 MED ORDER — METOPROLOL SUCCINATE ER 25 MG PO TB24: 12.5000 mg | ORAL_TABLET | Freq: Every day | ORAL | 3 refills | Status: AC

## 2023-11-19 MED ORDER — METOPROLOL SUCCINATE ER 25 MG PO TB24: 12.5000 mg | ORAL_TABLET | Freq: Every day | ORAL | Status: DC

## 2023-11-19 NOTE — Patient Instructions (Signed)
 Medication Instructions:  HOLD: Isosorbide  Mononitrate until further notice Decrease: Metoprolol  Succinate to 1/2 tablet 12.5 mg as needed  *If you need a refill on your cardiac medications before your next appointment, please call your pharmacy*  Lab Work: BMET, Lipid, A1c, TSH today If you have labs (blood work) drawn today and your tests are completely normal, you will receive your results only by: MyChart Message (if you have MyChart) OR A paper copy in the mail If you have any lab test that is abnormal or we need to change your treatment, we will call you to review the results.  Testing/Procedures: Your provider recommends you have a CHEST X-RAY.  A chest x-ray takes a picture of the organs and structures inside the chest, including the heart, lungs, and blood vessels. This test can show several things, including, whether the heart is enlarges; whether fluid is building up in the lungs; and whether pacemaker / defibrillator leads are still in place.   Follow-Up: At [facility], you and your health needs are our priority.  As part of our continuing mission to provide you with exceptional heart care, our providers are all part of one team.  This team includes your primary Cardiologist (physician) and Advanced Practice Providers or APPs (Physician Assistants and Nurse Practitioners) who all work together to provide you with the care you need, when you need it.  Your next appointment:   1 year(s)  Provider:   Dr Floretta

## 2023-11-20 LAB — HEMOGLOBIN A1C
Est. average glucose Bld gHb Est-mCnc: 123 mg/dL
Hgb A1c MFr Bld: 5.9 % — ABNORMAL HIGH (ref 4.8–5.6)

## 2023-11-20 LAB — BASIC METABOLIC PANEL WITH GFR
BUN/Creatinine Ratio: 16 (ref 10–24)
BUN: 18 mg/dL (ref 8–27)
CO2: 21 mmol/L (ref 20–29)
Calcium: 9.6 mg/dL (ref 8.6–10.2)
Chloride: 100 mmol/L (ref 96–106)
Creatinine, Ser: 1.13 mg/dL (ref 0.76–1.27)
Glucose: 80 mg/dL (ref 70–99)
Potassium: 4.8 mmol/L (ref 3.5–5.2)
Sodium: 137 mmol/L (ref 134–144)
eGFR: 66 mL/min/1.73 (ref >59)

## 2023-11-20 LAB — LIPID PANEL
Cholesterol, Total: 104 mg/dL (ref 100–199)
HDL: 32 mg/dL — AB (ref >39)
LDL CALC COMMENT:: 3.3 ratio (ref 0.0–5.0)
LDL Chol Calc (NIH): 40 mg/dL (ref 0–99)
Triglycerides: 198 mg/dL — AB (ref 0–149)
VLDL Cholesterol Cal: 32 mg/dL (ref 5–40)

## 2023-11-20 LAB — TSH: TSH: 2.6 u[IU]/mL (ref 0.450–4.500)

## 2023-11-22 ENCOUNTER — Other Ambulatory Visit (HOSPITAL_COMMUNITY): Payer: Self-pay

## 2023-11-22 ENCOUNTER — Ambulatory Visit: Payer: Self-pay | Admitting: Physician Assistant

## 2023-11-22 NOTE — Telephone Encounter (Signed)
 Pharmacy Patient Advocate Encounter  Received notification from [facility] that Prior Authorization for repatha  has been APPROVED from 11/22/23 to 01/18/25   PA #/Case ID/Reference #: [id number]

## 2023-11-22 NOTE — Telephone Encounter (Signed)
 Pharmacy Patient Advocate Encounter   Received notification from CoverMyMeds that prior authorization for repatha  is required/requested.   Insurance verification completed.   The patient is insured through [place].   Per test claim: PA required; PA submitted to above mentioned insurance via Latent Key/confirmation #/EOC [id number] Status is pending

## 2023-11-24 MED ORDER — AMIODARONE HCL 200 MG PO TABS: ORAL_TABLET | ORAL | 3 refills | Status: AC

## 2023-11-30 ENCOUNTER — Telehealth: Payer: Self-pay | Admitting: *Deleted

## 2023-11-30 NOTE — Telephone Encounter (Cosign Needed)
 SoS-AMI study I spoke to patient to ask if he would like to return to SOS-AMI study. Patient stated he does not feel like he wants to do the study. Patient is dealing with atrial fibrillation and was just placed on Amiodarone . I thanked patient for his prior participation in the study. We have the study medication which he already has returned.

## 2023-12-01 DIAGNOSIS — R29898 Other symptoms and signs involving the musculoskeletal system: Secondary | ICD-10-CM | POA: Insufficient documentation

## 2023-12-14 ENCOUNTER — Emergency Department (HOSPITAL_COMMUNITY)

## 2023-12-14 ENCOUNTER — Other Ambulatory Visit: Payer: Self-pay

## 2023-12-14 ENCOUNTER — Emergency Department (HOSPITAL_COMMUNITY)
Admission: EM | Admit: 2023-12-14 | Discharge: 2023-12-14 | Disposition: A | Discharge status: Home / Self Care | Attending: Emergency Medicine | Admitting: Emergency Medicine

## 2023-12-14 DIAGNOSIS — Z79899 Other long term (current) drug therapy: Secondary | ICD-10-CM | POA: Diagnosis not present

## 2023-12-14 DIAGNOSIS — I110 Hypertensive heart disease with heart failure: Secondary | ICD-10-CM | POA: Diagnosis not present

## 2023-12-14 DIAGNOSIS — R55 Syncope and collapse: Secondary | ICD-10-CM | POA: Diagnosis not present

## 2023-12-14 DIAGNOSIS — R29898 Other symptoms and signs involving the musculoskeletal system: Secondary | ICD-10-CM | POA: Diagnosis not present

## 2023-12-14 DIAGNOSIS — Z96611 Presence of right artificial shoulder joint: Secondary | ICD-10-CM | POA: Diagnosis not present

## 2023-12-14 DIAGNOSIS — I509 Heart failure, unspecified: Secondary | ICD-10-CM | POA: Insufficient documentation

## 2023-12-14 DIAGNOSIS — Z7982 Long term (current) use of aspirin: Secondary | ICD-10-CM | POA: Insufficient documentation

## 2023-12-14 DIAGNOSIS — I2510 Atherosclerotic heart disease of native coronary artery without angina pectoris: Secondary | ICD-10-CM | POA: Diagnosis not present

## 2023-12-14 DIAGNOSIS — R42 Dizziness and giddiness: Secondary | ICD-10-CM

## 2023-12-14 LAB — COMPREHENSIVE METABOLIC PANEL WITH GFR
ALT: 20 U/L (ref 0–44)
AST: 22 U/L (ref 15–41)
Albumin: 3.6 g/dL (ref 3.5–5.0)
Alkaline Phosphatase: 83 U/L (ref 38–126)
Anion gap: 9 (ref 5–15)
BUN: 14 mg/dL (ref 8–23)
CO2: 24 mmol/L (ref 22–32)
Calcium: 9 mg/dL (ref 8.9–10.3)
Chloride: 104 mmol/L (ref 98–111)
Creatinine, Ser: 1.14 mg/dL (ref 0.61–1.24)
GFR, Estimated: >60 mL/min (ref >60)
Glucose, Bld: 89 mg/dL (ref 70–99)
Potassium: 4.7 mmol/L (ref 3.5–5.1)
Sodium: 137 mmol/L (ref 135–145)
Total Bilirubin: 0.6 mg/dL (ref 0.0–1.2)
Total Protein: 6.4 g/dL — ABNORMAL LOW (ref 6.5–8.1)

## 2023-12-14 LAB — URINALYSIS, ROUTINE W REFLEX MICROSCOPIC
Bilirubin Urine: NEGATIVE
Glucose, UA: NEGATIVE mg/dL
Hgb urine dipstick: NEGATIVE
Ketones, ur: NEGATIVE mg/dL
Leukocytes,Ua: NEGATIVE
Nitrite: NEGATIVE
Protein, ur: NEGATIVE mg/dL
Specific Gravity, Urine: 1.009 (ref 1.005–1.030)
pH: 6 (ref 5.0–8.0)

## 2023-12-14 LAB — CBC
HCT: 43 % (ref 39.0–52.0)
Hemoglobin: 14.2 g/dL (ref 13.0–17.0)
MCH: 31.6 pg (ref 26.0–34.0)
MCHC: 33 g/dL (ref 30.0–36.0)
MCV: 95.8 fL (ref 80.0–100.0)
Platelets: 197 K/uL (ref 150–400)
RBC: 4.49 MIL/uL (ref 4.22–5.81)
RDW: 13.9 % (ref 11.5–15.5)
WBC: 7.9 K/uL (ref 4.0–10.5)
nRBC: 0 % (ref 0.0–0.2)

## 2023-12-14 LAB — CBG MONITORING, ED: Glucose-Capillary: 83 mg/dL (ref 70–99)

## 2023-12-14 NOTE — Discharge Instructions (Signed)
 We saw you in the ER for dizziness and near fainting. The workup in the ER is overall reassuring.  We are not sure what is causing your symptoms. The workup in the ER is not complete, and is limited to screening for life threatening and emergent conditions only, so please see a primary care doctor for further evaluation.  We will be putting in a referral for cardiology service so they can further assess you. Return to the ER if you have any dizziness, near fainting, fainting, chest pain, palpitations or shortness of breath.  [place] law prevents people with seizures or fainting from driving or operating dangerous machinery until they are free of seizures or fainting for 6 months.  In your case, if you are feeling dizzy or if feeling like you are going to faint, please do not operate any machinery.

## 2023-12-14 NOTE — ED Triage Notes (Signed)
 Patient from [facility] from PT, started feeling dizzy and near syncope. No LOC. No falls  EMS vitals 106/70 HR 87 O2 99 CBG 119  12 lead with PVCs.   18 LAC-- 250 cc fluid en route  Dizziness is almost resolved as of now.

## 2023-12-14 NOTE — ED Provider Triage Note (Signed)
 Emergency Medicine Provider Triage Evaluation Note  Jonathan Holden , a 78 y.o. male  was evaluated in triage.  Pt complains of lightheadedness/near syncope.  Was at his rehab/PT for his right shoulder when he became lightheaded.  He did not fully pass out, but states he was unable to walk.  This has happened to him several times before.  Denies chest pain, shortness of breath or palpitations.  Does note some issues with blood pressure medications for which he is working with his cardiologist..  Also notes history of A-fib.  Review of Systems  Positive: Lightheadedness/near syncope Negative: Chest pain shortness of breath  Physical Exam  BP 128/78 (BP Location: Right Arm)   Pulse 70   Temp 98 F (36.7 C) (Oral)   Resp 15   Ht 6' 2 (1.88 m)   Wt 95.3 kg   SpO2 100%   BMI 26.96 kg/m  Gen:   Awake, no distress   Resp:  Normal effort  MSK:   Moves extremities without difficulty  Other:  Regular rate, equal pulses.  Medical Decision Making  Medically screening exam initiated at 10:07 AM.  Appropriate orders placed.  Jonathan Holden was informed that the remainder of the evaluation will be completed by another provider, this initial triage assessment does not replace that evaluation, and the importance of remaining in the ED until their evaluation is complete.  78 year old presented for near syncope.  History of hypertension hyperlipidemia MI, CAD, A-fib.  Reports symptoms have nearly resolved..  Blood pressure soft with EMS 106/70 he did receive 250 cc of IV fluids.  Syncope workup initiated   Neysa Caron PARAS, DO 12/14/23 1010

## 2023-12-14 NOTE — ED Provider Notes (Signed)
 [place] EMERGENCY DEPARTMENT AT [facility] Provider Note   CSN: [id number] Arrival date & time: 12/14/23  9066     Patient presents with: Dizziness   Jonathan Holden is a 78 y.o. male.   HPI     78 year old patient comes in with chief complaint of dizziness.  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  Patient was at [facility] getting physical therapy.  While he was upright, performing exercises he started feeling dizzy.  Dizziness is described as feeling like he was going to blackout.  Patient sat down.  The physical therapy team then called 911.  Patient denies any associated nausea, sweating, one-sided weakness, numbness, slurring of the speech, vision loss or double vision, difficulty in swallowing.  Patient reports that he gets dizziness once every month or every other month.  Typically the symptoms are not as pronounced but they are similar to the symptoms he experienced today.  Patient has mentioned the dizziness to his cardiology doctors.  Review of system also negative for any chest pain, palpitations, shortness of breath, recent blood loss.  And patient denies any nausea, vomiting, diarrhea and has normal p.o. intake.  Prior to Admission medications   Medication Sig Start Date End Date Taking? Authorizing Provider  amiodarone  (PACERONE ) 200 MG tablet TAKE 1 TABLET (200 MG TOTAL) BY MOUTH DAILY. \ 11/24/23   Lucien Orren SAILOR, PA-C  aspirin  EC 81 MG tablet Take 1 tablet (81 mg total) by mouth daily. Swallow whole. 11/04/22   [place], Camellia PARAS, DO  atorvastatin  (LIPITOR) 40 MG tablet Take 1 tablet (40 mg total) by mouth daily. 11/09/23   Lelon Hamilton T, PA-C  Carboxymethylcellul-Glycerin  (LUBRICATING EYE DROPS OP) Place 1 drop into both eyes daily as needed (dry eyes).    [provider]  clobetasol  ointment (TEMOVATE ) 0.05 % APPLY TO AFFECTED  AREA TWICE A DAY 03/30/23   Kennyth Worth HERO, MD  Evolocumab  (REPATHA  SURECLICK) 140 MG/ML SOAJ Inject 140 mg into the skin every 14 (fourteen) days. 11/19/23   Lucien Orren SAILOR, PA-C  ferrous sulfate  325 (65 FE) MG tablet TAKE 1 TABLET BY MOUTH EVERY DAY WITH BREAKFAST 03/11/23   Kennyth Worth HERO, MD  isosorbide  mononitrate (IMDUR ) 30 MG 24 hr tablet Take 1 tablet (30 mg total) by mouth daily. 11/09/23   Lelon Hamilton T, PA-C  ketoconazole  (NIZORAL ) 2 % shampoo Apply 1 Application topically 3 (three) times a week. APPLY 1 TO 3 TIMES A WEEK TO SCALP. LET SIT 10 MIN THEN RINSE OUT. STRENGTH: 2 % 07/30/23   Kennyth Worth HERO, MD  lisinopril  (ZESTRIL ) 5 MG tablet Take 1 tablet (5 mg total) by mouth daily. 10/29/23   Jodie Lavern CROME, MD  metoprolol  succinate (TOPROL -XL) 25 MG 24 hr tablet Take 0.5 tablets (12.5 mg total) by mouth daily. Take with or immediately following a meal. Can take an additional tab as needed 11/19/23   Lucien Orren SAILOR, PA-C  nitroGLYCERIN  (NITROSTAT ) 0.4 MG SL tablet Place 1 tablet (0.4 mg total) under the tongue every 5 (five) minutes x 3 doses as needed for chest pain. 09/23/22   [facility], Jon Garre, PA  valACYclovir  (VALTREX ) 1000 MG tablet TAKE 2 TABLETS BY MOUTH AT THE ONSET OF A FLARE THEN TAKE 1 TWICE DAILY AS NEEDED FOR FEVER BLISTERS 03/05/23   Kennyth Worth HERO, MD    Allergies: Eliquis  [apixaban ], Ciprofloxacin, Food, Glutamic acid, Grass  pollen(k-o-r-t-swt vern), and Pollen extract    Review of Systems  All other systems reviewed and are negative.   Updated Vital Signs BP 128/78 (BP Location: Right Arm)   Pulse 70   Temp 98 F (36.7 C) (Oral)   Resp 15   Ht 6' 2 (1.88 m)   Wt 95.3 kg   SpO2 100%   BMI 26.96 kg/m   Physical Exam Vitals and nursing note reviewed.  Constitutional:      Appearance: He is well-developed.  HENT:     Head: Atraumatic.  Eyes:     Extraocular Movements: Extraocular movements intact.     Pupils: Pupils are equal, round, and reactive to  light.     Comments: Peripheral visual fields are intact  Cardiovascular:     Rate and Rhythm: Normal rate.  Pulmonary:     Effort: Pulmonary effort is normal.  Musculoskeletal:     Cervical back: Neck supple.     Right lower leg: No edema.     Left lower leg: No edema.  Skin:    General: Skin is warm.  Neurological:     Mental Status: He is alert and oriented to person, place, and time.     Cranial Nerves: No cranial nerve deficit.     Motor: No weakness.     Gait: Gait normal.     Comments: Patient personally ambulated by me, he did not have any ataxia and denied dizziness upon standing up     (all labs ordered are listed, but only abnormal results are displayed) Labs Reviewed  COMPREHENSIVE METABOLIC PANEL WITH GFR - Abnormal; Notable for the following components:      Result Value   Total Protein 6.4 (*)    All other components within normal limits  CBC  URINALYSIS, ROUTINE W REFLEX MICROSCOPIC  CBG MONITORING, ED    EKG: EKG Interpretation Date/Time:  Tuesday December 14 2023 09:39:14 EST Ventricular Rate:  68 PR Interval:  212 QRS Duration:  100 QT Interval:  422 QTC Calculation: 448 R Axis:   14  Text Interpretation: Sinus rhythm with 1st degree A-V block with frequent Premature ventricular complexes Otherwise normal ECG When compared with ECG of 19-Nov-2023 09:58, PREVIOUS ECG IS PRESENT Confirmed by Charlyn Sora ([id number]) on 12/14/2023 12:09:30 PM  Radiology: [facility] Chest 2 View Result Date: 12/14/2023 CLINICAL DATA:  Near syncope. EXAM: DG CHEST 2V COMPARISON:  11/19/2023 FINDINGS: The heart size and mediastinal contours are within normal limits. Both lungs are clear. Right shoulder prosthesis again noted. IMPRESSION: No active cardiopulmonary disease. Electronically Signed   By: Norleen DELENA Kil M.D.   On: 12/14/2023 11:41     Procedures   Medications Ordered in the ED - No data to display                                  Medical Decision Making Amount  and/or Complexity of Data Reviewed Labs: ordered.   78 year old patient with pertinent past medical history of CAD, paroxysmal A-fib, previous DVT, not on anticoagulation due to GI bleed, CHF comes in with chief complaint of dizziness with near syncope.  Collateral history provided by reviewing patient's previous records, including cardiology note. I have also reviewed patient's previous records including the medications. I reviewed patient's cardiac catheterization from 09-2022 and echocardiogram from the same.  He does not have any severe aortic stenosis.   Differential diagnosis considered for this  patient includes: Orthostatic hypotension, Vasovagal/neurocardiogenic syncope Stroke, Vertebral artery dissection/stenosis, complex partial seizure Dysrhythmia, severe aortic stenosis, cardiomyopathy, pulmonary embolism Anemia  Based on initial evaluation, basic labs were ordered.  Patient received IV fluids per EMS.  His EKG shows PVCs.  He has first-degree AV block.  Basic labs were ordered.  They are reassuring.  At no point did patient have chest pain, shortness of breath and does not have any hypoxia, tachycardia or tachypnea here.  I do not think we need to pursue PE workup.  I do not think patient needs troponins right now.  That being said, he has history of A-fib and he has CHF -arrhythmia could have caused his symptoms.  We will discharge the patient with cardiology follow-up, to see if he needs additional workup for near syncope/syncope.  I have independently interpreted patient's chest x-ray.  It is normal. Patient has been in the ER for over 2 hours, he wants to go home.  I will put cardiology referral and I have discussed with him strict return precautions.  If he starts having similar symptoms, then he will need to return to the ER.   Final diagnoses:  Dizziness  Near syncope    ED Discharge Orders          Ordered    Ambulatory referral to Cardiology       Comments: If  you have not heard from the Cardiology office within the next 72 hours please call [phone].   12/14/23 1224               Charlyn Sora, MD 12/14/23 1225

## 2023-12-15 ENCOUNTER — Telehealth: Payer: Self-pay

## 2023-12-15 NOTE — Telephone Encounter (Signed)
 Transition Care Management Unsuccessful Follow-up Telephone Call  Date of discharge and from where:  12/14/23; [facility]  Attempts:  1st Attempt  Reason for unsuccessful TCM follow-up call:  Unable to reach patient; pt referral placed to see Cardiology and not advised to follow up with PCP.

## 2023-12-20 DIAGNOSIS — R29898 Other symptoms and signs involving the musculoskeletal system: Secondary | ICD-10-CM | POA: Diagnosis not present

## 2023-12-22 ENCOUNTER — Ambulatory Visit: Payer: Self-pay

## 2023-12-22 ENCOUNTER — Telehealth: Payer: Self-pay | Admitting: Family Medicine

## 2023-12-22 NOTE — Telephone Encounter (Signed)
 This RN called pt who states he already spoke with NT and has an appointment tomorrow.   Copied from CRM [id number]. Topic: Clinical - Red Word Triage >> Dec 22, 2023  4:53 PM Jonathan Holden wrote: Red Word that prompted transfer to Nurse Triage:  Rectal bleeding

## 2023-12-22 NOTE — Telephone Encounter (Signed)
 Copied from CRM #[id number]. Topic: Clinical - Medical Advice >> Dec 22, 2023 12:08 PM Jonathan Holden wrote: Reason for CRM:  patient have a polyp in his intestine that bleeds from time to time- was prescribed an infection but do not remember the name   Patient asked for a call back at [phone] to discuss

## 2023-12-22 NOTE — Telephone Encounter (Signed)
 Copied from CRM #[id number]. Topic: Clinical - Medical Advice >> Dec 22, 2023 12:08 PM Tiffini S wrote: Reason for CRM:  patient have a polyp in his intestine that bleeds from time to time- was prescribed an infection but do not remember the name   Patient asked for a call back at [phone] to discuss

## 2023-12-22 NOTE — Telephone Encounter (Signed)
 Patient called requesting medication for abdominal polyps and rectal bleeding. I informed patient that due to not being seen in a while by PCP, medication refill will more than likely need an OV. Pt verbalized understanding and has been scheduled for next OV on 12/24/23. Due to reports of rectal bleeding onset 1 day and lower abdominal pain, I have sent patient to triage nurse. Due to long wait, patient disconnected call but I was informed via triage nurse that she would reach out to patient.

## 2023-12-22 NOTE — Telephone Encounter (Signed)
 FYI Only or Action Required?: FYI only for provider: appointment scheduled on 12.4.25.  Patient was last seen in primary care on 10/29/2023 by Jodie Lavern CROME, MD.  Called Nurse Triage reporting Rectal Bleeding.  Symptoms began several days ago.  Interventions attempted: Nothing.  Symptoms are: unchanged.  Triage Disposition: See Physician Within 24 Hours  Patient/caregiver understands and will follow disposition?: Yes   Copied from CRM [id number]. Topic: Clinical - Red Word Triage >> Dec 22, 2023  5:00 PM Nurse Powell BROCKS wrote: Red Word that prompted transfer to Nurse Triage: bleeding intestinal polyp Reason for Disposition  MODERATE rectal bleeding (e.g., small blood clots, passing blood without stool, or toilet water  turns red)  Answer Assessment - Initial Assessment Questions Received call from Havlyn from [facility]. She scheduled pt for Friday but asked Rn to triage patient. Pt is having blood from rectum. States he has a polyp that gets inflammed from time to time, worse after a bout of diarrhea. He states he was told no more colonoscopies needed. Is having abdominal discomfort 3/10. States he typically needs an atbx for this. Pt does want to be seen sooner if possible. Pt denies any chest pain, shortness of breath, dizziness or lightheadedness.     1. APPEARANCE of BLOOD: What color is it? Is it passed separately, on the surface of the stool, or mixed in with the stool?   Mixed with stool 2. AMOUNT: How much blood was passed?      Stated mild 3. ONSET: When was the blood first seen in the stools? (Days or weeks)      A couple of days ago 4. DIARRHEA: Is there also some diarrhea? If Yes, ask: How many diarrhea stools in the past 24 hours?      yes 5. CONSTIPATION: Do you have constipation? If Yes, ask: How bad is it?     no 6. RECURRENT SYMPTOMS: Have you had blood in your stools before? If Yes, ask: When was the last time? and What  happened that time?      Yes, requires atbx when this happens  7. BLOOD THINNERS: Do you take any blood thinners? (e.g., aspirin , clopidogrel  / Plavix , coumadin, heparin ). Notes: Other strong blood thinners include: Arixtra (fondaparinux), Eliquis  (apixaban ), Pradaxa (dabigatran), and Xarelto (rivaroxaban).     Baby aspirin  8. OTHER SYMPTOMS: Do you have any other symptoms?  (e.g., abdomen pain, vomiting, dizziness, fever)     Abdominal discomfort 3/10 states it's not pain, denies other symptoms  Protocols used: Rectal Bleeding-A-AH

## 2023-12-22 NOTE — Telephone Encounter (Signed)
 Please advice

## 2023-12-23 ENCOUNTER — Ambulatory Visit: Admitting: Family

## 2023-12-23 ENCOUNTER — Ambulatory Visit: Admitting: Family Medicine

## 2023-12-23 ENCOUNTER — Telehealth: Payer: Self-pay

## 2023-12-23 VITALS — BP 110/70 | HR 74 | Temp 97.2°F | Ht 74.0 in | Wt 208.2 lb

## 2023-12-23 DIAGNOSIS — R1030 Lower abdominal pain, unspecified: Secondary | ICD-10-CM

## 2023-12-23 MED ORDER — AMOXICILLIN-POT CLAVULANATE 875-125 MG PO TABS: 1.0000 | ORAL_TABLET | Freq: Two times a day (BID) | ORAL | 0 refills | Status: AC

## 2023-12-23 NOTE — Telephone Encounter (Signed)
 Looks like he saw Corean for this

## 2023-12-23 NOTE — Telephone Encounter (Signed)
 Noted

## 2023-12-23 NOTE — Telephone Encounter (Signed)
 Pt scheduled to see Hudnell in office today 12/23/23

## 2023-12-23 NOTE — Telephone Encounter (Signed)
 Copied from CRM [id number]. Topic: Clinical - Medication Question >> Dec 22, 2023  4:31 PM Lauren C wrote: Reason for CRM: Pt currently at pharmacy to try to get refill of something he thinks starts with a ce and then ends in a -flex and he states it is an antibiotic.  [id number] - [place], [place] - [address] [place] [address] Phone: [phone] Fax: [phone]  Duplicate msg, please see msg in chart already sent to PCP. Closing this enocounter

## 2023-12-23 NOTE — Progress Notes (Addendum)
 Patient ID: Jonathan Holden, male    DOB: 11-Aug-1945, 78 y.o.   MRN: [id number]  Chief Complaint  Patient presents with   Rectal Bleeding    Pt c/o Rectal bleeding and lower abdominal pain, present for 2 days. Has tried abx in the past which did help sx.   Discussed the use of AI scribe software for clinical note transcription with the patient, who gave verbal consent to proceed.  History of Present Illness Jonathan Holden is a 78 year old male who presents with recurrent bleeding from a colon polyp with mild abdominal pain.  He reports intermittent minor rectal bleeding from the known colon polyp, seen as blood on toilet paper without filling the bowl. He is on blood thinners. He denies known hemorrhoids, lumps, significant anal tenderness, itching, fever, or nausea. About a year ago he had a similar episode. Imaging at that time confirmed, and symptoms resolved after an antibiotic course. He feels this episode is identical and is requesting the same antibiotic. He had a CT scan in May for urinary symptoms that were ultimately not urinary in origin, and he recalls the scan again showed the polyp. He has occasional lower abdominal discomfort and pain but no systemic symptoms including fever or nausea.  Assessment & Plan Diverticulitis Symptoms Recurrent symptoms with minor bleeding, discomfort, and pain, likely triggered by dietary irritants. Per review of past EMR notes from 05/2023, previous CT confirmed diagnosis. No fever or nausea present. Will treat based on similar sx. - Any worsening abd pain, fever, nausea seek ED treatment. - Prescribed Augmentin  for 10d. - Advised taking antibiotics with food. - Recommended yogurt or probiotic. - Instructed to report if no improvement in 2-3 days.   Subjective:    Outpatient Medications Prior to Visit  Medication Sig Dispense Refill   amiodarone  (PACERONE ) 200 MG tablet TAKE 1 TABLET (200 MG TOTAL) BY MOUTH DAILY. \ 90 tablet 3   aspirin   EC 81 MG tablet Take 1 tablet (81 mg total) by mouth daily. Swallow whole. 30 tablet 12   atorvastatin  (LIPITOR) 40 MG tablet Take 1 tablet (40 mg total) by mouth daily. 90 tablet 0   Carboxymethylcellul-Glycerin  (LUBRICATING EYE DROPS OP) Place 1 drop into both eyes daily as needed (dry eyes).     clobetasol  ointment (TEMOVATE ) 0.05 % APPLY TO AFFECTED AREA TWICE A DAY 30 g 2   Evolocumab  (REPATHA  SURECLICK) 140 MG/ML SOAJ Inject 140 mg into the skin every 14 (fourteen) days. 6 mL 3   ferrous sulfate  325 (65 FE) MG tablet TAKE 1 TABLET BY MOUTH EVERY DAY WITH BREAKFAST 90 tablet 0   ketoconazole  (NIZORAL ) 2 % shampoo Apply 1 Application topically 3 (three) times a week. APPLY 1 TO 3 TIMES A WEEK TO SCALP. LET SIT 10 MIN THEN RINSE OUT. STRENGTH: 2 % 130 mL 1   lisinopril  (ZESTRIL ) 5 MG tablet Take 1 tablet (5 mg total) by mouth daily. 90 tablet 3   metoprolol  succinate (TOPROL -XL) 25 MG 24 hr tablet Take 0.5 tablets (12.5 mg total) by mouth daily. Take with or immediately following a meal. Can take an additional tab as needed 45 tablet 3   nitroGLYCERIN  (NITROSTAT ) 0.4 MG SL tablet Place 1 tablet (0.4 mg total) under the tongue every 5 (five) minutes x 3 doses as needed for chest pain. 25 tablet 3   triamcinolone  cream (KENALOG ) 0.1 % SMARTSIG:sparingly Topical Twice Daily     valACYclovir  (VALTREX ) 1000 MG tablet TAKE 2 TABLETS BY  MOUTH AT THE ONSET OF A FLARE THEN TAKE 1 TWICE DAILY AS NEEDED FOR FEVER BLISTERS 90 tablet 5   isosorbide  mononitrate (IMDUR ) 30 MG 24 hr tablet Take 1 tablet (30 mg total) by mouth daily. 90 tablet 0   No facility-administered medications prior to visit.   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   Pseudophakia of left eye 05/22/2013   Pseudophakia of right eye 06/05/2013   S/P coronary angioplasty    Skin cancer    Hands, arms, nose   Streptococcal bacteremia    Weakness of shoulder 12/01/2023   Past Surgical History:  Procedure Laterality Date   CARDIOVASCULAR STRESS TEST  08/22/2008   CATARACT EXTRACTION W/ INTRAOCULAR LENS IMPLANT Bilateral    COLONOSCOPY WITH PROPOFOL  N/A 02/10/2022   Procedure: COLONOSCOPY WITH PROPOFOL ;  Surgeon: Dianna Specking, MD;  Location: [facility] ENDOSCOPY;  Service: Gastroenterology;  Laterality: N/A;   CORONARY ANGIOPLASTY  1992   ERCP W/ SPHICTEROTOMY  12/15/2002   LAPAROSCOPIC CHOLECYSTECTOMY  12/16/2002   LEFT HEART CATH AND CORONARY ANGIOGRAPHY N/A 09/22/2022   Procedure: LEFT HEART CATH AND CORONARY ANGIOGRAPHY;  Surgeon: Wonda Sharper, MD;  Location: [facility] INVASIVE CV LAB;  Service: Cardiovascular;  Laterality: N/A;   LUMBAR LAMINECTOMY     L5   POLYPECTOMY  02/10/2022   Procedure: POLYPECTOMY;  Surgeon: Dianna Specking, MD;  Location: [facility]OSCOPY;  Service: Gastroenterology;;   REVERSE SHOULDER ARTHROPLASTY Right 01/09/2022   Procedure: REVERSE SHOULDER ARTHROPLASTY;  Surgeon: Sharl Selinda Dover, MD;  Location: [facility]S;  Service: Orthopedics;  Laterality: Right;  150   RIGHT SHOULDER SURGERY      SKIN CANCER EXCISION     TEE WITHOUT CARDIOVERSION N/A 02/28/2016   Procedure: TRANSESOPHAGEAL ECHOCARDIOGRAM (TEE);  Surgeon: Vina Okey GAILS, MD;  Location: [facility] ENDOSCOPY;  Service: Cardiovascular;  Laterality: N/A;   TOTAL HIP ARTHROPLASTY Left 07/23/2021   Procedure: TOTAL HIP ARTHROPLASTY ANTERIOR APPROACH;  Surgeon: Melodi Lerner, MD;  Location: [facility]S;  Service: Orthopedics;  Laterality:  Left;   Allergies  Allergen Reactions   Eliquis  [Apixaban ] Other (See Comments)    Recurrent GI bleeding occurred from diverticulosis in the setting of anticoagulation therapy for atrial fibrillation.   Ciprofloxacin Hives   Food Other (See Comments)    Pt is allergic to melons caused scratchy throat    Glutamic Acid Other (See Comments)    Pt is allergic to melons caused scratchy throat    Grass Pollen(K-O-R-T-Swt Vern)     Sneezing    Pollen Extract Other (See Comments)    Sneezing       Objective:    Physical Exam Vitals and nursing note reviewed.  Constitutional:      General: He is not in acute distress.    Appearance: Normal appearance.  HENT:     Head: Normocephalic.  Cardiovascular:     Rate and Rhythm: Normal rate and  regular rhythm.  Pulmonary:     Effort: Pulmonary effort is normal.     Breath sounds: Normal breath sounds.  Abdominal:     Palpations: Abdomen is soft.     Tenderness: There is no abdominal tenderness. There is no guarding. Negative signs include Murphy's sign and McBurney's sign.  Musculoskeletal:        General: Normal range of motion.     Cervical back: Normal range of motion.  Skin:    General: Skin is warm and dry.  Neurological:     Mental Status: He is alert and oriented to person, place, and time.  Psychiatric:        Mood and Affect: Mood normal.    BP 110/70 (BP Location: Left Arm, Patient Position: Sitting, Cuff Size: Large)   Pulse 74   Temp (!) 97.2 F (36.2 C) (Temporal)   Ht 6' 2 (1.88 m)   Wt 208 lb 4 oz (94.5 kg)   SpO2 96%   BMI 26.74 kg/m  Wt Readings from Last 3 Encounters:  12/23/23 208 lb 4 oz (94.5 kg)  12/14/23 210 lb (95.3 kg)  11/19/23 212 lb (96.2 kg)       Lucius Krabbe, NP

## 2023-12-24 ENCOUNTER — Ambulatory Visit: Admitting: Family Medicine

## 2023-12-27 DIAGNOSIS — R29898 Other symptoms and signs involving the musculoskeletal system: Secondary | ICD-10-CM | POA: Diagnosis not present

## 2024-01-25 ENCOUNTER — Ambulatory Visit: Admitting: Student in an Organized Health Care Education/Training Program

## 2024-01-31 NOTE — Progress Notes (Signed)
 " Cardiology Office Note:  .   Date:  01/31/2024  ID:  Reeves LELON Bernheim, DOB September 11, 1945, MRN [id number] PCP: Kennyth Worth HERO, MD  [facility] Providers Cardiologist:  Aleene Passe, MD (Inactive) Electrophysiologist:  Will Gladis Norton, MD { Chief Complaint: No chief complaint on file.   History of Present Illness: .    RISHAN OYAMA is a 79 y.o. 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  History of Present Illness       Studies Reviewed: SABRA    EKG: ***       Cardiac Studies & Procedures   ______________________________________________________________________________________________ CARDIAC CATHETERIZATION  CARDIAC CATHETERIZATION 09/22/2022  Conclusion Multivessel CAD with: CTO of the RCA, left-to-right collaterals Total occlusion of the ramus intermedius, suspected culprit Severe stenosis of the left circumflex from the ostium into the first OM Patent left main and LAD with mild nonobstructive disease present Normal LVEDP  With prolonged CP now completely resolved since yesterday, no indication for PCI of the occluded ramus branch. Favor medical therapy as no good PCI targets (CTO of the RCA, calcified complex ostial disease of the LCx). With no obstructive disease in the LAD, would reserve CABG for refractory anginal symptoms.  Findings Coronary Findings Diagnostic  Dominance: Right  Left Main There is mild diffuse disease throughout the vessel. Patent vessel with mild nonobstructive disease  Ramus Intermedius Ramus lesion is 100% stenosed.  Left Circumflex Ost Cx to Prox Cx lesion is 90% stenosed. The lesion is calcified. Complex calcified stenosis throughout the ostial and proximal circumflex  First Obtuse  Marginal Branch 1st Mrg lesion is 80% stenosed. Focal stenosis at the origin of the first OM  Right Coronary Artery Prox RCA to Mid RCA lesion is 100% stenosed. The lesion is severely calcified.  Right Posterior Descending Artery Collaterals RPDA filled by collaterals from 2nd Sept.  Collaterals RPDA filled by collaterals from Dist Cx.  Third Right Posterolateral Branch Collaterals 3rd RPL filled by collaterals from 1st Sept.  Intervention  No interventions have been documented.     ECHOCARDIOGRAM  ECHOCARDIOGRAM COMPLETE 09/21/2022  Narrative ECHOCARDIOGRAM REPORT    Patient Name:   VIRAJ LIBY Date of Exam: 09/21/2022 Medical Rec #:  [id number]        Height:       74.0 in Accession #:    [id number]       Weight:       217.0 lb Date of Birth:  1945/04/17        BSA:          2.250 m Patient Age:    77 years         BP:           148/77 mmHg Patient Gender: M                HR:           82 bpm. Exam Location:  Inpatient  Procedure: 2D Echo, Cardiac Doppler, Color Doppler and Intracardiac Opacification Agent  Indications:    NSTEMI I21.4  History:        Patient has prior history of Echocardiogram examinations, most recent 03/11/2016. CHF, CAD and Previous Myocardial Infarction, Arrythmias:Atrial Fibrillation; Risk Factors:Hypertension and Dyslipidemia.  Sonographer:    Aida Pizza RCS Referring Phys: [phone] DAYNA N DUNN  IMPRESSIONS   1. Left ventricular ejection fraction, by estimation, is 50 to 55%. The left ventricle has low normal function. The left ventricle demonstrates regional wall motion abnormalities (see scoring diagram/findings for description). There is moderate asymmetric left ventricular hypertrophy of the septal segment. Left ventricular diastolic parameters are consistent with Grade I diastolic dysfunction (impaired relaxation). 2. Right ventricular systolic function is normal. The right ventricular size is normal. Mildly increased right  ventricular wall thickness. Tricuspid regurgitation signal is inadequate for assessing PA pressure. 3. The mitral valve is grossly normal. Trivial mitral valve regurgitation. 4. The aortic valve is tricuspid. There is moderate calcification of the aortic valve. Aortic valve regurgitation is mild, peripherally at 6 o'clock in short axis. Aortic valve sclerosis/calcification is present, without any evidence of aortic stenosis. 5. The inferior vena cava is normal in size with greater than 50% respiratory variability, suggesting right atrial pressure of 3 mmHg.  Comparison(s): Prior images unable to be directly viewed.  FINDINGS Left Ventricle: Left ventricular ejection fraction, by estimation, is 50 to 55%. The left ventricle has low normal function. The left ventricle demonstrates regional wall motion abnormalities. Definity  contrast agent was given IV to delineate the left ventricular endocardial borders. The left ventricular internal cavity size was normal in size. There is moderate asymmetric left ventricular hypertrophy of the septal segment. Left ventricular diastolic parameters are consistent with Grade I diastolic dysfunction (impaired relaxation).   LV Wall Scoring: The basal inferior segment and basal inferoseptal segment are hypokinetic. The entire anterior wall, entire lateral wall, entire anterior septum, entire apex, mid and distal inferior wall, and mid inferoseptal segment are normal.  Right Ventricle: The right ventricular size is normal. Mildly increased right ventricular wall thickness. Right ventricular systolic function is normal. Tricuspid regurgitation signal is inadequate for assessing PA pressure.  Left Atrium: Left atrial size was normal in size.  Right Atrium: Right atrial size was normal in size.  Pericardium: There is no evidence of pericardial effusion.  Mitral Valve: The mitral valve is grossly normal. Mild mitral annular calcification. Trivial mitral valve  regurgitation.  Tricuspid Valve: The tricuspid valve is grossly normal. Tricuspid valve regurgitation is trivial.  Aortic Valve: The aortic valve is tricuspid. There is moderate calcification of the aortic valve. There is mild aortic valve annular calcification. Aortic valve regurgitation is mild. Aortic valve sclerosis/calcification is present, without any evidence of aortic stenosis.  Pulmonic Valve: The pulmonic valve was grossly normal. Pulmonic valve regurgitation is trivial.  Aorta: The aortic root is normal in size and structure.  Venous: The inferior vena cava is normal in size with greater than 50% respiratory variability, suggesting right atrial pressure of 3 mmHg.  IAS/Shunts: No atrial level shunt detected by color flow Doppler.   LEFT VENTRICLE PLAX 2D LVIDd:         5.00 cm   Diastology LVIDs:         3.60 cm   LV e' medial:    5.00 cm/s LV PW:         1.20 cm   LV E/e' medial:  8.2 LV IVS:        1.40 cm   LV e' lateral:   5.98 cm/s LVOT diam:     2.20 cm   LV E/e' lateral: 6.9 LV SV:         86 LV SV Index:   38 LVOT Area:  3.80 cm   RIGHT VENTRICLE RV S prime:     12.00 cm/s TAPSE (M-mode): 2.0 cm  LEFT ATRIUM             Index        RIGHT ATRIUM           Index LA diam:        2.90 cm 1.29 cm/m   RA Area:     16.40 cm LA Vol (A2C):   77.8 ml 34.58 ml/m  RA Volume:   41.40 ml  18.40 ml/m LA Vol (A4C):   56.0 ml 24.89 ml/m LA Biplane Vol: 66.1 ml 29.38 ml/m AORTIC VALVE LVOT Vmax:   88.30 cm/s LVOT Vmean:  53.000 cm/s LVOT VTI:    0.227 m  AORTA Ao Root diam: 3.60 cm  MITRAL VALVE MV Area (PHT): 2.69 cm    SHUNTS MV Decel Time: 282 msec    Systemic VTI:  0.23 m MV E velocity: 41.00 cm/s  Systemic Diam: 2.20 cm MV A velocity: 76.50 cm/s MV E/A ratio:  0.54  Jayson Sierras MD Electronically signed by Jayson Sierras MD Signature Date/Time: 09/21/2022/3:26:16 PM    Final   TEE  ECHO TEE 02/28/2016  Narrative *[place]* *[facility]* [address] [place], [place] [address] [phone]  ------------------------------------------------------------------- Transesophageal Echocardiography  Patient:    Raydan, Schlabach MR #:       [id number] Study Date: 02/28/2016 Gender:     M Age:        71 Height:     188 cm Weight:     96.4 kg BSA:        2.26 m^2 Pt. Status: Room:       4E03C  PERFORMING   Vina Gull, M.D. SONOGRAPHER  Koren Daring, RDCS ATTENDING    Milissa Brasil J ORDERING     Milissa Brasil J ADMITTING    Alen Pastor M  cc:  -------------------------------------------------------------------  ------------------------------------------------------------------- Indications:      Bacteremia 790.7.  ------------------------------------------------------------------- Study Conclusions  - Aortic valve: AV is mildly thickened. No definite vegetation though cannot exclude colonization. There was trivial regurgitation.  Impressions:  - NO obvious vegetations.  ------------------------------------------------------------------- Study data:   Study status:  Routine.  Consent:  The risks, benefits, and alternatives to the procedure were explained to the patient and informed consent was obtained.  Procedure:  Initial setup. The patient was brought to the laboratory. Surface ECG leads were monitored. Sedation. Conscious sedation was administered. Transesophageal echocardiography. Topical anesthesia was obtained using viscous lidocaine . A transesophageal probe was inserted by the attending cardiologist. Image quality was adequate.  Study completion:  The patient tolerated the procedure well. There were no complications.  Administered medications:   Fentanyl , 25mcg, IV. Midazolam , 3mg , IV.          Diagnostic transesophageal echocardiography.  2D and color Doppler.  Birthdate:  Patient birthdate: 1945-12-10.  Age:  Patient is 79 yr old.  Sex:  Gender: male.    BMI: 27.3 kg/m^2.   Blood pressure:     137/79  Patient status:  Inpatient.  Study date:  Study date: 02/28/2016. Study time: 12:05 PM.  Location:  Endoscopy.  -------------------------------------------------------------------  ------------------------------------------------------------------- Left ventricle:  LVEF is normal.  ------------------------------------------------------------------- Aortic valve:  AV is mildly thickened. No definite vegetation though cannot exclude colonization.  Doppler:  There was trivial regurgitation.  ------------------------------------------------------------------- Aorta:  The aorta was normal, not dilated, and non-diseased.  ------------------------------------------------------------------- Mitral valve:  MV  is normal Trace MR.  ------------------------------------------------------------------- Left atrium:   No evidence of thrombus in the atrial cavity or appendage.  ------------------------------------------------------------------- Right ventricle:  RVEF is normal.  ------------------------------------------------------------------- Pulmonic valve:   PV is normal .  ------------------------------------------------------------------- Tricuspid valve:  TV is normal MIld TR.  ------------------------------------------------------------------- Post procedure conclusions Ascending Aorta:  - The aorta was normal, not dilated, and non-diseased.  ------------------------------------------------------------------- Prepared and Electronically Authenticated by  Vina Gull, M.D. 2018-02-09T15:28:51  MONITORS  LONG TERM MONITOR (3-14 DAYS) 11/30/2022  Narrative   Predominant rhythm is sinus rhythm   Rare episodes of nonsustained  supraventricular tachycardia .  the fastest and longest  episode lasted 5 beat with a maximal HR of 126.   Rare PVCs, rare PACs   Patch Wear Time:  13 days and 23 hours (2024-10-22T09:12:57-0400 to  2024-11-05T08:12:49-0500)  Patient had a min HR of 60 bpm, max HR of 126 bpm, and avg HR of 76 bpm. Predominant underlying rhythm was Sinus Rhythm. Bundle Branch Block/IVCD was present. 5 Supraventricular Tachycardia runs occurred, the run with the fastest interval lasting 5 beats with a max rate of 126 bpm (avg 113 bpm); the run with the fastest interval was also the longest. Isolated SVEs were rare (<1.0%), SVE Couplets were rare (<1.0%), and SVE Triplets were rare (<1.0%). Isolated VEs were rare (<1.0%), and no VE Couplets or VE Triplets were present.       ______________________________________________________________________________________________      Results   Risk Assessment/Calculations:    {Does this patient have ATRIAL FIBRILLATION?[phone]} No BP recorded.  {Refresh Note OR Click here to enter BP  :1}***        Physical Exam:    VS:  There were no vitals taken for this visit. ***    Wt Readings from Last 3 Encounters:  12/23/23 208 lb 4 oz (94.5 kg)  12/14/23 210 lb (95.3 kg)  11/19/23 212 lb (96.2 kg)     GEN: Well nourished, well developed, in no acute distress NECK: No JVD; No carotid bruits CARDIAC: ***RRR, no murmurs, rubs, gallops RESPIRATORY:  Clear to auscultation without rales, wheezing or rhonchi  ABDOMEN: Soft, non-tender, non-distended, normal bowel sounds EXTREMITIES:  Warm and well perfused, no edema; No deformity, 2+ radial pulses PSYCH: Normal mood and affect   ASSESSMENT AND PLAN: .    Assessment and Plan  #CAD #NSTEMI   #PAF -No watchman due to access issues from bilateral DVTs  #HTN  #HLD Assessment & Plan        {Are you ordering a CV Procedure (e.g. stress test, cath, DCCV, TEE, etc)?   Press F2        :[id number]}    Follow up: ***  This note was written with the assistance of a dictation microphone or AI dictation software. Please excuse any typos or grammatical errors.   Signed, Georganna Archer, MD  01/31/2024  11:27 PM    [facility] "

## 2024-02-01 ENCOUNTER — Ambulatory Visit
Attending: Student in an Organized Health Care Education/Training Program | Admitting: Student in an Organized Health Care Education/Training Program

## 2024-02-01 ENCOUNTER — Encounter: Payer: Self-pay | Admitting: Student in an Organized Health Care Education/Training Program

## 2024-02-01 VITALS — BP 110/66 | HR 84 | Ht 74.0 in | Wt 208.0 lb

## 2024-02-01 DIAGNOSIS — I2510 Atherosclerotic heart disease of native coronary artery without angina pectoris: Secondary | ICD-10-CM | POA: Diagnosis present

## 2024-02-01 DIAGNOSIS — Z7689 Persons encountering health services in other specified circumstances: Secondary | ICD-10-CM | POA: Diagnosis present

## 2024-02-01 DIAGNOSIS — R0683 Snoring: Secondary | ICD-10-CM | POA: Diagnosis present

## 2024-02-01 NOTE — Patient Instructions (Signed)
" °  Testing/Procedures: [facility]  Your physician has recommended that you have a sleep study. This test records several body functions during sleep, including: brain activity, eye movement, oxygen and carbon dioxide blood levels, heart rate and rhythm, breathing rate and rhythm, the flow of air through your mouth and nose, snoring, body muscle movements, and chest and belly movement.  REFERRAL TO PHARM-D   Follow-Up: At [facility], you and your health needs are our priority.  As part of our continuing mission to provide you with exceptional heart care, our providers are all part of one team.  This team includes your primary Cardiologist (physician) and Advanced Practice Providers or APPs (Physician Assistants and Nurse Practitioners) who all work together to provide you with the care you need, when you need it.  Your next appointment:   6 month(s)  Provider:   Georganna Archer, MD    We recommend signing up for the patient portal called MyChart.  Sign up information is provided on this After Visit Summary.  MyChart is used to connect with patients for Virtual Visits (Telemedicine).  Patients are able to view lab/test results, encounter notes, upcoming appointments, etc.  Non-urgent messages can be sent to your provider as well.   To learn more about what you can do with MyChart, go to [email or web address].           "

## 2024-02-02 ENCOUNTER — Other Ambulatory Visit: Payer: Self-pay | Admitting: Physician Assistant

## 2024-02-06 NOTE — Progress Notes (Unsigned)
 "    Office Visit    Patient Name: Jonathan Holden Date of Encounter: 02/07/2024  Primary Care Provider:  No primary care provider on file. 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 male patient of Dr Archer, in the office today to discuss options for weight management.   He notes that his weight fluctuates between about 210 and 225, but would like to be under 200.  He did stop alcohol altogether last spring, and did lose a few pounds with that.  He is hoping to get reduction in CV risk as well as weight management.    Baseline weight/BMI: 95.8 kg // 27.11  Insurance payor:  [employer] with [employer] card  Diet: wife was nutritionist, eats nothing fried, rare red meat (hamburger only); protein is mostly chicken, some beans and yogurt; plenty of fruits and vegetables.    Exercise: hip rpaced recently, did rehab, now graduated  Social History:   Tobacco: no  Alcohol: quit lasat year  Caffeine: 2 coffee in the morning   Accessory Clinical Findings    Lab Results  Component Value Date   CREATININE 1.14 12/14/2023   BUN 14 12/14/2023   NA 137 12/14/2023   K 4.7 12/14/2023   CL 104 12/14/2023   CO2 24 12/14/2023   Lab Results  Component Value Date   ALT 20 12/14/2023   AST 22 12/14/2023   ALKPHOS 83 12/14/2023   BILITOT 0.6 12/14/2023   Lab Results  Component Value Date   HGBA1C 5.9 (H) 11/19/2023      Home Medications/Allergies    Current Outpatient Medications  Medication  Sig Dispense Refill   amiodarone  (PACERONE ) 200 MG tablet TAKE 1 TABLET (200 MG TOTAL) BY MOUTH DAILY. \ 90 tablet 3   aspirin  EC 81 MG tablet Take 1 tablet (81 mg total) by mouth daily. Swallow whole. 30 tablet 12   atorvastatin  (LIPITOR) 40 MG tablet TAKE 1 TABLET BY MOUTH EVERY DAY 90 tablet 0   Carboxymethylcellul-Glycerin  (LUBRICATING EYE DROPS OP) Place 1 drop into both eyes daily as needed (dry eyes).     clobetasol  ointment (TEMOVATE ) 0.05 % APPLY TO AFFECTED AREA TWICE A DAY 30 g 2   Evolocumab  (REPATHA  SURECLICK) 140 MG/ML SOAJ Inject 140 mg into the skin every 14 (fourteen) days. 6 mL 3   ferrous sulfate  325 (65 FE) MG tablet TAKE 1 TABLET BY MOUTH EVERY DAY WITH BREAKFAST 90 tablet 0   ketoconazole  (NIZORAL ) 2 % shampoo Apply 1 Application topically 3 (three) times a week. APPLY 1 TO 3 TIMES A WEEK TO SCALP. LET SIT 10 MIN THEN RINSE OUT. STRENGTH: 2 % 130 mL 1   lisinopril  (ZESTRIL ) 5 MG tablet Take 1 tablet (5 mg total) by mouth daily. 90 tablet 3   metoprolol  succinate (TOPROL -XL) 25 MG 24 hr tablet Take 0.5 tablets (12.5 mg total) by mouth daily. Take with or immediately following a meal. Can take an additional  tab as needed 45 tablet 3   nitroGLYCERIN  (NITROSTAT ) 0.4 MG SL tablet Place 1 tablet (0.4 mg total) under the tongue every 5 (five) minutes x 3 doses as needed for chest pain. 25 tablet 3   triamcinolone  cream (KENALOG ) 0.1 % SMARTSIG:sparingly Topical Twice Daily     valACYclovir  (VALTREX ) 1000 MG tablet TAKE 2 TABLETS BY MOUTH AT THE ONSET OF A FLARE THEN TAKE 1 TWICE DAILY AS NEEDED FOR FEVER BLISTERS 90 tablet 5   amoxicillin -clavulanate (AUGMENTIN ) 875-125 MG tablet Take 1 tablet by mouth 2 (two) times daily after a meal. (Patient not taking: Reported on 02/01/2024) 20 tablet 0   No current facility-administered medications for this visit.     Allergies[2]  Assessment & Plan    Overweight (BMI 25.0-29.9)  Patient has not met goal of at least 5% of body weight loss  with comprehensive lifestyle modifications alone in the past 3-6 months. Pharmacotherapy is appropriate to pursue as augmentation. Will start [facility] for weight management and CV p.   Advised patient on common side effects including nausea, diarrhea, dyspepsia, decreased appetite, and fatigue. Counseled patient on reducing meal size and how to titrate medication to minimize side effects. Patient aware to call if intolerable side effects or if experiencing dehydration, abdominal pain, or dizziness. Patient will adhere to dietary modifications and will target at least 150 minutes of moderate intensity exercise weekly, plus resistance training twice a week (as recommended by the American Heart Association). This resistance training--such as weightlifting, bodyweight exercises, or using resistance bands, adapted to the patient's ability--will help prevent muscle loss.  Injection technique reviewed at today's visit.    Follow up in 1-2 days regarding coverage of Wegovy . If therapy is initiated, phone or MyChart follow-ups will be conducted every 4 weeks for dose titration until the patient reaches the effective therapeutic dose and target weight.    Allean Mink PharmD CPP [facility]  [address] [place], [place] [address] [phone]      [1]  Allergies Allergen Reactions   Eliquis  [Apixaban ] Other (See Comments)    Recurrent GI bleeding occurred from diverticulosis in the setting of anticoagulation therapy for atrial fibrillation.   Ciprofloxacin Hives   Food Other (See Comments)    Pt is allergic to melons caused scratchy throat    Glutamic Acid Other (See Comments)    Pt is allergic to melons caused scratchy throat    Grass Pollen(K-O-R-T-Swt Vern)     Sneezing    Pollen Extract Other (See Comments)    Sneezing   [2]  Allergies Allergen Reactions   Eliquis  [Apixaban ] Other (See Comments)    Recurrent GI bleeding occurred from diverticulosis in the setting of  anticoagulation therapy for atrial fibrillation.   Ciprofloxacin Hives   Food Other (See Comments)    Pt is allergic to melons caused scratchy throat    Glutamic Acid Other (See Comments)    Pt is allergic to melons caused scratchy throat    Grass Pollen(K-O-R-T-Swt Vern)     Sneezing    Pollen Extract Other (See Comments)    Sneezing    "

## 2024-02-07 ENCOUNTER — Encounter: Payer: Self-pay | Admitting: Pharmacist Clinician (PhC)/ Clinical Pharmacy Specialist

## 2024-02-07 ENCOUNTER — Ambulatory Visit: Admitting: Pharmacist Clinician (PhC)/ Clinical Pharmacy Specialist

## 2024-02-07 ENCOUNTER — Telehealth: Payer: Self-pay | Admitting: Pharmacist Clinician (PhC)/ Clinical Pharmacy Specialist

## 2024-02-07 VITALS — Ht 74.0 in | Wt 211.2 lb

## 2024-02-07 DIAGNOSIS — E663 Overweight: Secondary | ICD-10-CM | POA: Insufficient documentation

## 2024-02-07 NOTE — Patient Instructions (Signed)
 We will start the prior authorization process to get Wegovy  covered by your insurance.   TIPS FOR SUCCESS Write down the reasons why you want to lose weight and post it in a place where you'll see it often. Start small and work your way up. Keep in mind that it takes time to achieve goals, and small steps add up. Any additional movements help to burn calories. Taking the stairs rather than the elevator and parking at the far end of your parking lot are easy ways to start. Brisk walking for at least 30 minutes 4 or more days of the week is an excellent goal to work toward  [employer] WHAT IT MEANS TO FEEL FULL Did you know that it can take 15 minutes or more for your brain to receive the message that you've eaten? This means that if you eat less food, but consume it slower, you may still feel satisfied. Eating a lot of fruits and vegetables can also help you feel fuller. Eat off of smaller plates so that moderate portions don't seem too small  TITRATION PLAN  You will start with the lowest dose and increase every 4 weeks (30 days for tablets) as tolerated.  If you feel you need another month on the same dose, that is perfectly fine.  GI side effects are the most problematic (nausea, vomiting).  When you get to the 4th injection (4th week of tablets) please send me a MyChart message or call [phone], and tell me the following information:  1.  Any change in weight (we don't always expect in the first month or two). 2.  Any side effects (mostly nausea/vomiting) 3.  Do you feel as though you are wanting less food? 4.  Are you continuing to work on exercise and lifestyle modifications. 5.  Are you ready to move up to the next dose, or would you like to  stay here for another 4 weeks. (We encourage staying at the same dose if nausea and/or vomiting were problematic)  I need to have this information to help determine whether to increase the dose or stay the same for another month.    If you  have any questions or concerns, please reach out to me.  MyChart is the fastest way to find me, but you can also call me directly at [phone].  (Note: I am not always available to check phone messages daily)  THANK YOU FOR CHOOSING [facility] Allean

## 2024-02-07 NOTE — Telephone Encounter (Signed)
 Please do PA for [facility] - history of MI

## 2024-02-07 NOTE — Assessment & Plan Note (Addendum)
" °  Patient has not met goal of at least 5% of body weight loss with comprehensive lifestyle modifications alone in the past 3-6 months. Pharmacotherapy is appropriate to pursue as augmentation. Will start [facility] for weight management and CV p.   Advised patient on common side effects including nausea, diarrhea, dyspepsia, decreased appetite, and fatigue. Counseled patient on reducing meal size and how to titrate medication to minimize side effects. Patient aware to call if intolerable side effects or if experiencing dehydration, abdominal pain, or dizziness. Patient will adhere to dietary modifications and will target at least 150 minutes of moderate intensity exercise weekly, plus resistance training twice a week (as recommended by the American Heart Association). This resistance training--such as weightlifting, bodyweight exercises, or using resistance bands, adapted to the patient's ability--will help prevent muscle loss.  Injection technique reviewed at today's visit.    Follow up in 1-2 days regarding coverage of Wegovy . If therapy is initiated, phone or MyChart follow-ups will be conducted every 4 weeks for dose titration until the patient reaches the effective therapeutic dose and target weight.  "

## 2024-02-08 ENCOUNTER — Telehealth: Payer: Self-pay | Admitting: Pharmacy Technician

## 2024-02-08 ENCOUNTER — Other Ambulatory Visit: Payer: Self-pay | Admitting: Physician Assistant

## 2024-02-08 ENCOUNTER — Other Ambulatory Visit (HOSPITAL_COMMUNITY): Payer: Self-pay

## 2024-02-08 NOTE — Telephone Encounter (Signed)
" ° ° ° °  Pharmacy Patient Advocate Encounter   Received notification from Pt Calls Messages that prior authorization for wegovy is required/requested.   Insurance verification completed.   The patient is insured through [employer].   Per test claim: PA required; PA submitted to above mentioned insurance via Latent Key/confirmation #/EOC [facility] Status is pending  "

## 2024-02-08 NOTE — Telephone Encounter (Signed)
 PA request has been Submitted. New Encounter has been or will be created for follow up. For additional info see Pharmacy Prior Auth telephone encounter from 02/08/24.

## 2024-02-09 ENCOUNTER — Other Ambulatory Visit (HOSPITAL_COMMUNITY): Payer: Self-pay

## 2024-02-09 NOTE — Telephone Encounter (Signed)
 Pharmacy Patient Advocate Encounter  Received notification from [facility] that Prior Authorization for wegovy has been APPROVED from 02/09/24 to 01/18/25. Ran test claim, Copay is $594.53. This test claim was processed through [facility]- copay amounts may vary at other pharmacies due to pharmacy/plan contracts, or as the patient moves through the different stages of their insurance plan.   PA #/Case ID/Reference #: [id number]    130.00 deductible then 464.53 coinsurance

## 2024-02-10 ENCOUNTER — Telehealth: Payer: Self-pay

## 2024-02-10 NOTE — Telephone Encounter (Signed)
 Pt stated he just wanted a refill & should not have to come in for ov. Offered the first available on 02/15/24 & pt stated he won't need the medicine then.

## 2024-02-10 NOTE — Telephone Encounter (Signed)
Patient need OV

## 2024-02-10 NOTE — Telephone Encounter (Signed)
 Noted.

## 2024-02-10 NOTE — Telephone Encounter (Signed)
 Recommend appointment if possible.

## 2024-02-10 NOTE — Telephone Encounter (Signed)
 Please advise on sending in antibiotic or if patient should be seen to be given antibiotic. Please and thank you.   Copied from CRM #[id number]. Topic: Clinical - Medication Refill >> Feb 10, 2024  8:53 AM Jonathan Holden wrote: Medication: amoxicillin -clavulanate (AUGMENTIN ) 875-125 MG tablet  Has the patient contacted their pharmacy? Yes (Agent: If no, request that the patient contact the pharmacy for the refill. If patient does not wish to contact the pharmacy document the reason why and proceed with request.) (Agent: If yes, when and what did the pharmacy advise?)  This is the patient'Holden preferred pharmacy:  [id number] - [place], [place] - [address] [place] [address] Phone: [phone] Fax: [phone]  Is this the correct pharmacy for this prescription? Yes If no, delete pharmacy and type the correct one.   Has the prescription been filled recently? No  Is the patient out of the medication? Yes  Has the patient been seen for an appointment in the last year OR does the patient have an upcoming appointment? Yes  Can we respond through MyChart? Yes  Agent: Please be advised that Rx refills may take up to 3 business days. We ask that you follow-up with your pharmacy.

## 2024-02-10 NOTE — Telephone Encounter (Signed)
 THIS MEDICATION WAS DISCONTINUED ON 2024-2025. BY SCOTT T WEAVER, PA-C

## 2024-02-14 ENCOUNTER — Encounter (HOSPITAL_BASED_OUTPATIENT_CLINIC_OR_DEPARTMENT_OTHER): Payer: Self-pay | Admitting: Emergency Medicine

## 2024-02-14 ENCOUNTER — Emergency Department (HOSPITAL_BASED_OUTPATIENT_CLINIC_OR_DEPARTMENT_OTHER)
Admission: EM | Admit: 2024-02-14 | Discharge: 2024-02-14 | Disposition: A | Discharge status: Home / Self Care | Source: Ambulatory Visit | Attending: Emergency Medicine | Admitting: Emergency Medicine

## 2024-02-14 ENCOUNTER — Other Ambulatory Visit: Payer: Self-pay

## 2024-02-14 ENCOUNTER — Other Ambulatory Visit: Payer: Self-pay | Admitting: Family Medicine

## 2024-02-14 DIAGNOSIS — H33011 Retinal detachment with single break, right eye: Secondary | ICD-10-CM | POA: Diagnosis not present

## 2024-02-14 DIAGNOSIS — Z96642 Presence of left artificial hip joint: Secondary | ICD-10-CM | POA: Diagnosis not present

## 2024-02-14 DIAGNOSIS — I509 Heart failure, unspecified: Secondary | ICD-10-CM | POA: Insufficient documentation

## 2024-02-14 DIAGNOSIS — Z79899 Other long term (current) drug therapy: Secondary | ICD-10-CM | POA: Insufficient documentation

## 2024-02-14 DIAGNOSIS — I110 Hypertensive heart disease with heart failure: Secondary | ICD-10-CM | POA: Insufficient documentation

## 2024-02-14 DIAGNOSIS — I2510 Atherosclerotic heart disease of native coronary artery without angina pectoris: Secondary | ICD-10-CM | POA: Insufficient documentation

## 2024-02-14 DIAGNOSIS — H538 Other visual disturbances: Secondary | ICD-10-CM | POA: Diagnosis present

## 2024-02-14 DIAGNOSIS — F17210 Nicotine dependence, cigarettes, uncomplicated: Secondary | ICD-10-CM | POA: Diagnosis not present

## 2024-02-14 DIAGNOSIS — H4311 Vitreous hemorrhage, right eye: Secondary | ICD-10-CM | POA: Insufficient documentation

## 2024-02-14 DIAGNOSIS — Z7982 Long term (current) use of aspirin: Secondary | ICD-10-CM | POA: Insufficient documentation

## 2024-02-14 DIAGNOSIS — H3321 Serous retinal detachment, right eye: Secondary | ICD-10-CM

## 2024-02-14 MED ORDER — WEGOVY 0.25 MG/0.5ML ~~LOC~~ SOAJ: 0.2500 mg | SUBCUTANEOUS | 0 refills | Status: AC

## 2024-02-14 MED ORDER — TETRACAINE HCL 0.5 % OP SOLN
1.0000 [drp] | Freq: Once | OPHTHALMIC | Status: AC
  Administered 2024-02-14: 1 [drp] via OPHTHALMIC
  Filled 2024-02-14: qty 4

## 2024-02-14 MED ORDER — FLUORESCEIN SODIUM 1 MG OP STRP
1.0000 | ORAL_STRIP | Freq: Once | OPHTHALMIC | Status: AC
  Administered 2024-02-14: 1 via OPHTHALMIC
  Filled 2024-02-14: qty 1

## 2024-02-14 NOTE — ED Notes (Signed)
 Reviewed AVS/discharge instructions with patient. Time allotted for and all questions answered. Patient is agreeable for d/c and escorted to ED exit by staff.

## 2024-02-14 NOTE — ED Notes (Signed)
 Facesheet faxed to [facility]

## 2024-02-14 NOTE — ED Provider Notes (Signed)
 " [place] EMERGENCY DEPARTMENT AT [facility] Provider Note   CSN: [id number] Arrival date & time: 02/14/24  1530     Patient presents with: Eye Problem   Jonathan Holden is a 79 y.o. male.  {Add pertinent medical, surgical, social history, OB history to HPI:7066} 79 year old male history of bilateral cataract replacement several years ago and left retinal detachment who presents emergency department with blurry vision, flashes, and floaters out of his right eye.  Started 2 days ago.  Initially was some small areas that were blurry but says that his entire visual field is blurry on the right side now. No eye pain       Prior to Admission medications  Medication Sig Start Date End Date Taking? Authorizing Provider  amiodarone  (PACERONE ) 200 MG tablet TAKE 1 TABLET (200 MG TOTAL) BY MOUTH DAILY. \ 11/24/23   Lucien Orren SAILOR, PA-C  amoxicillin -clavulanate (AUGMENTIN ) 875-125 MG tablet Take 1 tablet by mouth 2 (two) times daily after a meal. Patient not taking: Reported on 02/01/2024 12/23/23   Lucius Krabbe, NP  aspirin  EC 81 MG tablet Take 1 tablet (81 mg total) by mouth daily. Swallow whole. 11/04/22   [place], Eric J, DO  atorvastatin  (LIPITOR) 40 MG tablet TAKE 1 TABLET BY MOUTH EVERY DAY 02/02/24   Lelon Hamilton T, PA-C  Carboxymethylcellul-Glycerin  (LUBRICATING EYE DROPS OP) Place 1 drop into both eyes daily as needed (dry eyes).    [provider]  clobetasol  ointment (TEMOVATE ) 0.05 % APPLY TO AFFECTED AREA TWICE A DAY 03/30/23   Kennyth Worth HERO, MD  Evolocumab  (REPATHA  SURECLICK) 140 MG/ML SOAJ Inject 140 mg into the skin every 14 (fourteen) days. 11/19/23   Lucien Orren SAILOR, PA-C  ferrous sulfate  325 (65 FE) MG tablet TAKE 1 TABLET BY MOUTH EVERY DAY WITH BREAKFAST 03/11/23   Kennyth Worth HERO, MD  ketoconazole  (NIZORAL ) 2 % shampoo Apply 1 Application topically 3 (three) times a week. APPLY 1 TO 3 TIMES A WEEK TO SCALP. LET SIT 10 MIN THEN RINSE OUT. STRENGTH: 2  % 07/30/23   Kennyth Worth HERO, MD  lisinopril  (ZESTRIL ) 5 MG tablet Take 1 tablet (5 mg total) by mouth daily. 10/29/23   Jodie Lavern CROME, MD  metoprolol  succinate (TOPROL -XL) 25 MG 24 hr tablet Take 0.5 tablets (12.5 mg total) by mouth daily. Take with or immediately following a meal. Can take an additional tab as needed 11/19/23   Lucien Orren SAILOR, PA-C  nitroGLYCERIN  (NITROSTAT ) 0.4 MG SL tablet Place 1 tablet (0.4 mg total) under the tongue every 5 (five) minutes x 3 doses as needed for chest pain. 09/23/22   [facility], Jon Garre, PA  semaglutide -weight management (WEGOVY ) 0.25 MG/0.5ML SOAJ SQ injection Inject 0.25 mg into the skin once a week. FOR 4 WEEKS 02/14/24   Floretta Mallard, MD  triamcinolone  cream (KENALOG ) 0.1 % SMARTSIG:sparingly Topical Twice Daily 11/01/23   [provider]  valACYclovir  (VALTREX ) 1000 MG tablet TAKE 2 TABLETS BY MOUTH AT THE ONSET OF A FLARE THEN TAKE 1 TWICE DAILY AS NEEDED FOR FEVER BLISTERS 03/05/23   Kennyth Worth HERO, MD    Allergies: Eliquis  [apixaban ], Ciprofloxacin, Food, Glutamic acid, Grass pollen(k-o-r-t-swt vern), and Pollen extract    Review of Systems  Updated Vital Signs BP (!) 179/99   Pulse 81   Temp 97.6 F (36.4 C)   Resp 12   Ht 6' 2 (1.88 m)   Wt 95 kg   SpO2 100%   BMI 26.89 kg/m  Physical Exam HENT:     Head: Normocephalic and atraumatic.  Eyes:     Extraocular Movements: Extraocular movements intact.     Conjunctiva/sclera: Conjunctivae normal.     Pupils: Pupils are equal, round, and reactive to light.     Comments: Able to count fingers out of right eye.  Pupils 3 mm bilaterally. PERRL     (all labs ordered are listed, but only abnormal results are displayed) Labs Reviewed - No data to display  EKG: None  Radiology: No results found.  {Document cardiac monitor, telemetry assessment procedure when appropriate:32947} Procedures   Medications Ordered in the ED - No data to display    {Click here for  ABCD2, HEART and other calculators REFRESH Note before signing:1}                              Medical Decision Making  ***  {Document critical care time when appropriate  Document review of labs and clinical decision tools ie CHADS2VASC2, etc  Document your independent review of radiology images and any outside records  Document your discussion with family members, caretakers and with consultants  Document social determinants of health affecting pt's care  Document your decision making why or why not admission, treatments were needed:32947:::1}   Final diagnoses:  None    ED Discharge Orders     None        "

## 2024-02-14 NOTE — ED Triage Notes (Signed)
 Patient here with vision fogginess 2 days ago to the R eye. He said this resolved however today this morning he had noticed he had floaters, flashing in his vision and the vision fogginess. He had retinal detachment in his L eye 4 years ago and these symptoms are exactly the same. He had surgery at [facility] for this.

## 2024-02-14 NOTE — Addendum Note (Signed)
 Addended by: Olander Friedl L on: 02/14/2024 03:33 PM   Modules accepted: Orders

## 2024-02-14 NOTE — Consult Note (Addendum)
 Chief Complaint/Reason for Consultation: Floaters, foggy vision, concern for possible retinal detachment  HPI: 79 yo M with past ocular history of RD OS presents with 3 day c/o painless visual loss, floaters, flashes, and now upper curtain in visual field OD. He denies any history of trauma OU. The symptoms have been progressive over the last 3 days. No known history of significant myopia (he states he only wore reading glasses prior to cataract surgery, no distance glasses). He states that his RD OS presented spontaneously after he had prior cataract surgery OU.  ROS: otherwise as in HPI  Patient Active Problem List   Diagnosis Date Noted   Overweight (BMI 25.0-29.9) 02/07/2024   Lower extremity edema 03/31/2022   Acute deep vein thrombosis (DVT) of femoral vein of both lower extremities (HCC) 12/23/2021   History of adenomatous polyp of colon 09/10/2021   Osteoarthritis of left hip s/p replacement 07/2021 07/23/2021   CHF (congestive heart failure) (HCC) 07/25/2019   Diverticulosis 04/18/2016   Syncope 03/10/2016   Paroxysmal A-fib (HCC) 02/21/2016   CAD (coronary artery disease) 09/19/2014   HTN (hypertension) 09/19/2014   HLD (hyperlipidemia) 10/03/2013   Family History  Problem Relation Age of Onset   Other Mother        AGE 31 HEALTHY   Heart attack Father 75       2 MI   Heart disease Maternal Grandfather    Heart disease Paternal Grandfather    Emphysema Other    Other Brother    Social History   Socioeconomic History   Marital status: Divorced    Spouse name: Not on file   Number of children: Not on file   Years of education: Not on file   Highest education level: Bachelor's degree (e.g., BA, AB, BS)  Occupational History   Occupation: sales  Tobacco Use   Smoking status: Former    Current packs/day: 0.00    Average packs/day: 2.0 packs/day for 20.0 years (40.0 ttl pk-yrs)    Types: Cigarettes    Start date: 03/27/1970    Quit date: 03/27/1990    Years since  quitting: 33.9   Smokeless tobacco: Never  Vaping Use   Vaping status: Never Used  Substance and Sexual Activity   Alcohol use: Yes    Comment: Social   Drug use: No   Sexual activity: Not Currently  Other Topics Concern   Not on file  Social History Narrative   Not on file   Social Drivers of Health   Tobacco Use: Medium Risk (02/14/2024)   Patient History    Smoking Tobacco Use: Former    Smokeless Tobacco Use: Never    Passive Exposure: Not on Actuary Strain: Low Risk (12/23/2023)   Overall Financial Resource Strain (CARDIA)    Difficulty of Paying Living Expenses: Not very hard  Food Insecurity: No Food Insecurity (12/23/2023)   Epic    Worried About Radiation Protection Practitioner of Food in the Last Year: Never true    Ran Out of Food in the Last Year: Never true  Transportation Needs: No Transportation Needs (12/23/2023)   Epic    Lack of Transportation (Medical): No    Lack of Transportation (Non-Medical): No  Physical Activity: Insufficiently Active (12/23/2023)   Exercise Vital Sign    Days of Exercise per Week: 2 days    Minutes of Exercise per Session: 20 min  Stress: No Stress Concern Present (12/23/2023)   [employer] of Occupational Health - Occupational Stress Questionnaire  Feeling of Stress: Not at all  Social Connections: Socially Isolated (12/23/2023)   Social Connection and Isolation Panel    Frequency of Communication with Friends and Family: More than three times a week    Frequency of Social Gatherings with Friends and Family: Once a week    Attends Religious Services: Never    Database Administrator or Organizations: No    Attends Engineer, Structural: Not on file    Marital Status: Divorced  Intimate Partner Violence: Not At Risk (10/19/2023)   Epic    Fear of Current or Ex-Partner: No    Emotionally Abused: No    Physically Abused: No    Sexually Abused: No  Depression (PHQ2-9): Low Risk (10/19/2023)   Depression (PHQ2-9)    PHQ-2  Score: 0  Alcohol Screen: Low Risk (10/19/2023)   Alcohol Screen    Last Alcohol Screening Score (AUDIT): 0  Housing: Unknown (12/23/2023)   Epic    Unable to Pay for Housing in the Last Year: No    Number of Times Moved in the Last Year: Not on file    Homeless in the Last Year: No  Utilities: Not At Risk (10/19/2023)   Epic    Threatened with loss of utilities: No  Health Literacy: Adequate Health Literacy (10/19/2023)   B1300 Health Literacy    Frequency of need for help with medical instructions: Never    PSH CE/IOL OU and subsequent RD repair OS @ [facility] approximately 4 years ago   EXAMINATION  VAsc (near): OD: 20/100  , 20/70 near cc with +2.5D free lens OS: 20/30  , PH 20/  Pupils:  OD: 3.78mm, round, reactive, no APD OS: 2mm, irregular (mild superior peaking), reactive, no APD  T(Pen): OD: 14   mm Hg OS:  14  mm Hg  CVF:  OD: superior constriction close to fixation OS: full to finger counting   EOM: full OU, no limitation or restriction OU  Slit lamp Exam: Ext/Lids: 1+ MGD, telangiectasia OU, mild collarettes OU, hidrocystoma near LLL punctum  Conj/Sclera: white and quiet OU, OS conj scarring/scleral buckle Cornea: clear OU, no abrasion or infiltrate OU AC: Deep and Quiet OU, no vitreous OS Iris: OD: Round and Flat OS irregular/mild superior peaking Lens: multifocal PCIOL OU, open capsule OU   Dilated OU with phenylephrine  and tropicamide OU @ 5:24pm   Dilated Fundus Exam: Vitreous: OD PVD, mild VH   OS clear (?post vitrectomy) Disc: sharp and pink with 0.3 c/d OU Macula: OD inferior macula detached, OS flat and dry  Vessels: normal distribution OU, perfused OU Periphery:  OD: inferior RRD from 2 oclock to 9 oclock, tracking into the inferior macula, break/tear not identified OS attached 360 with scleral buckle and cryo-scars   Imp/Plan:  Macula-off Rhegmatogenous retinal detachment OD We discussed retinal detachments of this type require prompt  surgical correction with a retina specialist. We discussed inferior detachments are typically more complex and may even require multiple surgeries, though would defer to expertise of retinal surgeon regarding more in-depth counseling regarding typical course. Last meal was around 12 noon today, NPO for now and especially after midnight discussed with patient. Discussed importance of prompt follow-up with retinal specialist today or tomorrow morning at the latest. Patient requested follow-up with retina @ [facility] (where the OS was repaired). Will see if this can be arranged, if not recommend prompt local follow-up. Posterior vitreous detachment with mild vitreous hemorrhage Probable cause of #1 History of retinal detachment  OS RD precautions reviewed: seek care immediately if new or worsening flashes, floaters, or curtain over the vision. Hidrocystoma LLL Patient inquired regarding cyst, was referred to oculoplastics before, though missed the appt, recommend f/u with them due to proximity to punctum/canalicular system. F/u non-urgently for this problem.    Elspeth DOROTHA Aran, M.D. [facility]

## 2024-02-14 NOTE — Discharge Instructions (Addendum)
 You were seen for your retinal attachment in the emergency department.   At home, please avoid any activities where he could hit her head.  Do not eat after midnight in case they need to perform surgery tomorrow.   Check your MyChart online for the results of any tests that had not resulted by the time you left the emergency department.   Go to the eye center at [facility] tomorrow morning at 9:00 am.   Thank you for visiting our Emergency Department. It was a pleasure taking care of you today.

## 2024-02-16 ENCOUNTER — Telehealth: Payer: Self-pay

## 2024-02-16 NOTE — Telephone Encounter (Signed)
 Transition Care Management Follow-up Telephone Call Date of discharge and from where: 02/14/2024 Jonathan Holden How have you been since you were released from the hospital? Yes Any questions or concerns? No  Items Reviewed: Did the pt receive and understand the discharge instructions provided? Yes  Medications obtained and verified? Yes  Other?  Any new allergies since your discharge? No  Dietary orders reviewed? No Do you have support at home? Yes   Home Care and Equipment/Supplies: Were home health services ordered? no If so, what is the name of the agency?   Has the agency set up a time to come to the patient's home? not applicable Were any new equipment or medical supplies ordered?  No What is the name of the medical supply agency?  Were you able to get the supplies/equipment? not applicable Do you have any questions related to the use of the equipment or supplies? No  Functional Questionnaire: (I = Independent and D = Dependent) ADLs: I  Bathing/Dressing- I  Meal Prep- I  Eating- I  Maintaining continence- I  Transferring/Ambulation- I  Managing Meds- I  Follow up appointments reviewed:  PCP Hospital f/u appt confirmed? [facility] f/u appt confirmed? Yes  Scheduled to see at [facility] on 02/16/2024 @ 1100. Are transportation arrangements needed? No  If their condition worsens, is the pt aware to call PCP or go to the Emergency Dept.? Yes Was the patient provided with contact information for the PCP's office or ED? Yes Was to pt encouraged to call back with questions or concerns? Yes

## 2024-02-16 NOTE — Telephone Encounter (Signed)
 Transition Care Management Unsuccessful Follow-up Telephone Call  Date of discharge and from where:  02/14/2024 [facility]   Attempts:  1st Attempt  Reason for unsuccessful TCM follow-up call:  Left voice message
